# Patient Record
Sex: Male | Born: 1960 | Race: Black or African American | Hispanic: No | Marital: Married | Smoking: Former smoker
Health system: Southern US, Community
[De-identification: ages and names within clinical notes are randomized; demographics above are authoritative.]

## PROBLEM LIST (undated history)

## (undated) DIAGNOSIS — R0602 Shortness of breath: Secondary | ICD-10-CM

## (undated) DIAGNOSIS — K219 Gastro-esophageal reflux disease without esophagitis: Secondary | ICD-10-CM

## (undated) DIAGNOSIS — K8020 Calculus of gallbladder without cholecystitis without obstruction: Secondary | ICD-10-CM

## (undated) DIAGNOSIS — G4733 Obstructive sleep apnea (adult) (pediatric): Secondary | ICD-10-CM

## (undated) DIAGNOSIS — I10 Essential (primary) hypertension: Secondary | ICD-10-CM

## (undated) DIAGNOSIS — G8929 Other chronic pain: Secondary | ICD-10-CM

## (undated) DIAGNOSIS — M1990 Unspecified osteoarthritis, unspecified site: Secondary | ICD-10-CM

## (undated) DIAGNOSIS — M109 Gout, unspecified: Secondary | ICD-10-CM

## (undated) DIAGNOSIS — E1142 Type 2 diabetes mellitus with diabetic polyneuropathy: Secondary | ICD-10-CM

## (undated) DIAGNOSIS — E119 Type 2 diabetes mellitus without complications: Secondary | ICD-10-CM

## (undated) DIAGNOSIS — J449 Chronic obstructive pulmonary disease, unspecified: Secondary | ICD-10-CM

## (undated) DIAGNOSIS — J189 Pneumonia, unspecified organism: Secondary | ICD-10-CM

## (undated) DIAGNOSIS — F419 Anxiety disorder, unspecified: Secondary | ICD-10-CM

## (undated) DIAGNOSIS — E785 Hyperlipidemia, unspecified: Secondary | ICD-10-CM

## (undated) DIAGNOSIS — F528 Other sexual dysfunction not due to a substance or known physiological condition: Secondary | ICD-10-CM

## (undated) DIAGNOSIS — I4892 Unspecified atrial flutter: Principal | ICD-10-CM

## (undated) DIAGNOSIS — K5790 Diverticulosis of intestine, part unspecified, without perforation or abscess without bleeding: Secondary | ICD-10-CM

## (undated) DIAGNOSIS — F329 Major depressive disorder, single episode, unspecified: Secondary | ICD-10-CM

## (undated) DIAGNOSIS — I509 Heart failure, unspecified: Secondary | ICD-10-CM

## (undated) DIAGNOSIS — M549 Dorsalgia, unspecified: Secondary | ICD-10-CM

## (undated) DIAGNOSIS — J42 Unspecified chronic bronchitis: Secondary | ICD-10-CM

## (undated) DIAGNOSIS — J45909 Unspecified asthma, uncomplicated: Secondary | ICD-10-CM

## (undated) DIAGNOSIS — I219 Acute myocardial infarction, unspecified: Secondary | ICD-10-CM

## (undated) HISTORY — DX: Other sexual dysfunction not due to a substance or known physiological condition: F52.8

## (undated) HISTORY — PX: FINGER AMPUTATION: SHX636

## (undated) HISTORY — PX: CARDIAC CATHETERIZATION: SHX172

## (undated) HISTORY — DX: Major depressive disorder, single episode, unspecified: F32.9

## (undated) HISTORY — DX: Obstructive sleep apnea (adult) (pediatric): G47.33

## (undated) HISTORY — DX: Type 2 diabetes mellitus with diabetic polyneuropathy: E11.42

## (undated) HISTORY — DX: Gout, unspecified: M10.9

## (undated) HISTORY — DX: Hyperlipidemia, unspecified: E78.5

## (undated) HISTORY — DX: Anxiety disorder, unspecified: F41.9

## (undated) HISTORY — DX: Diverticulosis of intestine, part unspecified, without perforation or abscess without bleeding: K57.90

---
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
## 1989-07-25 HISTORY — PX: ANKLE RECONSTRUCTION: SHX1151

## 1996-11-24 DIAGNOSIS — G4733 Obstructive sleep apnea (adult) (pediatric): Secondary | ICD-10-CM

## 1996-11-24 HISTORY — DX: Obstructive sleep apnea (adult) (pediatric): G47.33

## 1999-10-28 ENCOUNTER — Encounter: Payer: Self-pay | Admitting: *Deleted

## 1999-10-28 ENCOUNTER — Inpatient Hospital Stay (HOSPITAL_COMMUNITY): Admission: EM | Admit: 1999-10-28 | Discharge: 1999-10-31 | Payer: Self-pay | Admitting: Emergency Medicine

## 1999-10-29 ENCOUNTER — Encounter: Payer: Self-pay | Admitting: Internal Medicine

## 1999-11-28 ENCOUNTER — Ambulatory Visit (HOSPITAL_COMMUNITY): Admission: RE | Admit: 1999-11-28 | Discharge: 1999-11-28 | Payer: Self-pay | Admitting: *Deleted

## 1999-12-05 ENCOUNTER — Ambulatory Visit (HOSPITAL_COMMUNITY): Admission: RE | Admit: 1999-12-05 | Discharge: 1999-12-05 | Payer: Self-pay | Admitting: Pulmonary Disease

## 1999-12-05 ENCOUNTER — Encounter: Payer: Self-pay | Admitting: Pulmonary Disease

## 1999-12-08 ENCOUNTER — Ambulatory Visit: Admission: RE | Admit: 1999-12-08 | Discharge: 1999-12-08 | Payer: Self-pay | Admitting: Pulmonary Disease

## 2002-03-01 ENCOUNTER — Encounter: Payer: Self-pay | Admitting: Cardiology

## 2002-03-01 ENCOUNTER — Encounter: Admission: RE | Admit: 2002-03-01 | Discharge: 2002-03-01 | Payer: Self-pay | Admitting: Cardiology

## 2002-04-26 ENCOUNTER — Ambulatory Visit (HOSPITAL_COMMUNITY): Admission: RE | Admit: 2002-04-26 | Discharge: 2002-04-26 | Payer: Self-pay | Admitting: Cardiology

## 2002-04-26 ENCOUNTER — Encounter: Payer: Self-pay | Admitting: Cardiology

## 2003-01-11 ENCOUNTER — Encounter: Payer: Self-pay | Admitting: Cardiology

## 2003-01-11 ENCOUNTER — Encounter: Admission: RE | Admit: 2003-01-11 | Discharge: 2003-01-11 | Payer: Self-pay | Admitting: Cardiology

## 2003-08-03 ENCOUNTER — Encounter: Admission: RE | Admit: 2003-08-03 | Discharge: 2003-11-01 | Payer: Self-pay | Admitting: Cardiology

## 2004-02-19 ENCOUNTER — Encounter (HOSPITAL_COMMUNITY): Admission: RE | Admit: 2004-02-19 | Discharge: 2004-05-19 | Payer: Self-pay | Admitting: Cardiology

## 2004-07-08 ENCOUNTER — Observation Stay (HOSPITAL_COMMUNITY): Admission: EM | Admit: 2004-07-08 | Discharge: 2004-07-09 | Payer: Self-pay | Admitting: Emergency Medicine

## 2011-10-27 ENCOUNTER — Emergency Department (HOSPITAL_COMMUNITY): Payer: Managed Care, Other (non HMO)

## 2011-10-27 ENCOUNTER — Other Ambulatory Visit: Payer: Self-pay

## 2011-10-27 ENCOUNTER — Inpatient Hospital Stay (HOSPITAL_COMMUNITY)
Admission: EM | Admit: 2011-10-27 | Discharge: 2011-10-30 | DRG: 291 | Disposition: A | Discharge status: Home / Self Care | Payer: Managed Care, Other (non HMO) | Attending: Internal Medicine | Admitting: Internal Medicine

## 2011-10-27 ENCOUNTER — Encounter: Payer: Self-pay | Admitting: *Deleted

## 2011-10-27 DIAGNOSIS — E669 Obesity, unspecified: Secondary | ICD-10-CM | POA: Diagnosis present

## 2011-10-27 DIAGNOSIS — I509 Heart failure, unspecified: Secondary | ICD-10-CM

## 2011-10-27 DIAGNOSIS — R509 Fever, unspecified: Secondary | ICD-10-CM | POA: Diagnosis present

## 2011-10-27 DIAGNOSIS — I10 Essential (primary) hypertension: Secondary | ICD-10-CM | POA: Diagnosis present

## 2011-10-27 DIAGNOSIS — E119 Type 2 diabetes mellitus without complications: Secondary | ICD-10-CM | POA: Diagnosis present

## 2011-10-27 DIAGNOSIS — R0600 Dyspnea, unspecified: Secondary | ICD-10-CM | POA: Diagnosis present

## 2011-10-27 DIAGNOSIS — I5022 Chronic systolic (congestive) heart failure: Secondary | ICD-10-CM | POA: Diagnosis present

## 2011-10-27 DIAGNOSIS — M109 Gout, unspecified: Secondary | ICD-10-CM | POA: Insufficient documentation

## 2011-10-27 DIAGNOSIS — F17200 Nicotine dependence, unspecified, uncomplicated: Secondary | ICD-10-CM | POA: Diagnosis present

## 2011-10-27 DIAGNOSIS — K5792 Diverticulitis of intestine, part unspecified, without perforation or abscess without bleeding: Secondary | ICD-10-CM | POA: Insufficient documentation

## 2011-10-27 DIAGNOSIS — I5033 Acute on chronic diastolic (congestive) heart failure: Principal | ICD-10-CM | POA: Diagnosis present

## 2011-10-27 DIAGNOSIS — R319 Hematuria, unspecified: Secondary | ICD-10-CM | POA: Diagnosis not present

## 2011-10-27 DIAGNOSIS — J189 Pneumonia, unspecified organism: Secondary | ICD-10-CM | POA: Diagnosis present

## 2011-10-27 DIAGNOSIS — J984 Other disorders of lung: Secondary | ICD-10-CM | POA: Diagnosis present

## 2011-10-27 DIAGNOSIS — Z79899 Other long term (current) drug therapy: Secondary | ICD-10-CM

## 2011-10-27 HISTORY — DX: Unspecified osteoarthritis, unspecified site: M19.90

## 2011-10-27 HISTORY — DX: Essential (primary) hypertension: I10

## 2011-10-27 HISTORY — DX: Shortness of breath: R06.02

## 2011-10-27 HISTORY — DX: Heart failure, unspecified: I50.9

## 2011-10-27 LAB — COMPREHENSIVE METABOLIC PANEL
AST: 29 U/L (ref 0–37)
Albumin: 2.6 g/dL — ABNORMAL LOW (ref 3.5–5.2)
Albumin: 2.5 g/dL — ABNORMAL LOW (ref 3.5–5.2)
Alkaline Phosphatase: 82 U/L (ref 39–117)
BUN: 14 mg/dL (ref 6–23)
CO2: 28 mEq/L (ref 19–32)
Calcium: 8.1 mg/dL — ABNORMAL LOW (ref 8.4–10.5)
Chloride: 97 mEq/L (ref 96–112)
Creatinine, Ser: 0.94 mg/dL (ref 0.50–1.35)
Creatinine, Ser: 0.96 mg/dL (ref 0.50–1.35)
GFR calc Af Amer: >90 mL/min (ref >90)
GFR calc non Af Amer: >90 mL/min (ref >90)
GFR calc non Af Amer: >90 mL/min (ref >90)
Glucose, Bld: 192 mg/dL — ABNORMAL HIGH (ref 70–99)
Potassium: 3.5 mEq/L (ref 3.5–5.1)
Total Bilirubin: 0.7 mg/dL (ref 0.3–1.2)
Total Protein: 7.4 g/dL (ref 6.0–8.3)

## 2011-10-27 LAB — TSH: TSH: 3.181 u[IU]/mL (ref 0.350–4.500)

## 2011-10-27 LAB — LIPID PANEL
Cholesterol: 144 mg/dL (ref 0–200)
HDL: 20 mg/dL — ABNORMAL LOW (ref >39)
LDL Cholesterol: 94 mg/dL (ref 0–99)
Total CHOL/HDL Ratio: 7.2 RATIO
Triglycerides: 150 mg/dL — ABNORMAL HIGH (ref <150)
VLDL: 30 mg/dL (ref 0–40)

## 2011-10-27 LAB — GLUCOSE, CAPILLARY
Glucose-Capillary: 132 mg/dL — ABNORMAL HIGH (ref 70–99)
Glucose-Capillary: 135 mg/dL — ABNORMAL HIGH (ref 70–99)
Glucose-Capillary: 218 mg/dL — ABNORMAL HIGH (ref 70–99)

## 2011-10-27 LAB — CREATININE, SERUM
Creatinine, Ser: 0.99 mg/dL (ref 0.50–1.35)
GFR calc Af Amer: >90 mL/min (ref >90)
GFR calc non Af Amer: >90 mL/min (ref >90)

## 2011-10-27 LAB — PRO B NATRIURETIC PEPTIDE: Pro B Natriuretic peptide (BNP): 2516 pg/mL — ABNORMAL HIGH (ref 0–125)

## 2011-10-27 LAB — CBC
HCT: 33.4 % — ABNORMAL LOW (ref 39.0–52.0)
Hemoglobin: 11 g/dL — ABNORMAL LOW (ref 13.0–17.0)
Hemoglobin: 10.4 g/dL — ABNORMAL LOW (ref 13.0–17.0)
MCH: 27.6 pg (ref 26.0–34.0)
Platelets: 206 10*3/uL (ref 150–400)
Platelets: 200 10*3/uL (ref 150–400)
RBC: 3.96 MIL/uL — ABNORMAL LOW (ref 4.22–5.81)
RBC: 3.85 MIL/uL — ABNORMAL LOW (ref 4.22–5.81)
RBC: 3.77 MIL/uL — ABNORMAL LOW (ref 4.22–5.81)
RDW: 14 % (ref 11.5–15.5)
WBC: 5.4 10*3/uL (ref 4.0–10.5)
WBC: 4.8 10*3/uL (ref 4.0–10.5)

## 2011-10-27 LAB — CARDIAC PANEL(CRET KIN+CKTOT+MB+TROPI)
CK, MB: 1.9 ng/mL (ref 0.3–4.0)
CK, MB: 1.7 ng/mL (ref 0.3–4.0)
Total CK: 66 U/L (ref 7–232)
Troponin I: <0.3 ng/mL (ref <0.30)
Troponin I: <0.3 ng/mL (ref <0.30)

## 2011-10-27 LAB — DIFFERENTIAL
Eosinophils Absolute: 0 10*3/uL (ref 0.0–0.7)
Lymphs Abs: 0.8 10*3/uL (ref 0.7–4.0)
Monocytes Absolute: 0.4 10*3/uL (ref 0.1–1.0)
Monocytes Relative: 12 % (ref 3–12)
Neutrophils Relative %: 63 % (ref 43–77)

## 2011-10-27 LAB — URINALYSIS, ROUTINE W REFLEX MICROSCOPIC
Bilirubin Urine: NEGATIVE
Ketones, ur: NEGATIVE mg/dL
Nitrite: NEGATIVE
Specific Gravity, Urine: 1.007 (ref 1.005–1.030)
Urobilinogen, UA: 0.2 mg/dL (ref 0.0–1.0)

## 2011-10-27 LAB — INFLUENZA PANEL BY PCR (TYPE A & B)
H1N1 flu by pcr: NOT DETECTED
Influenza B By PCR: NEGATIVE

## 2011-10-27 LAB — URINE MICROSCOPIC-ADD ON

## 2011-10-27 LAB — LACTIC ACID, PLASMA: Lactic Acid, Venous: 1.7 mmol/L (ref 0.5–2.2)

## 2011-10-27 MED ORDER — ACETAMINOPHEN 325 MG PO TABS
ORAL_TABLET | ORAL | Status: AC
  Filled 2011-10-27: qty 3

## 2011-10-27 MED ORDER — LISINOPRIL 5 MG PO TABS
5.0000 mg | ORAL_TABLET | Freq: Every day | ORAL | Status: DC
  Administered 2011-10-28 – 2011-10-29 (×2): 5 mg via ORAL
  Filled 2011-10-27 (×3): qty 1

## 2011-10-27 MED ORDER — ACETAMINOPHEN 325 MG PO TABS
650.0000 mg | ORAL_TABLET | ORAL | Status: DC | PRN
  Administered 2011-10-27: 650 mg via ORAL

## 2011-10-27 MED ORDER — ONDANSETRON HCL 4 MG/2ML IJ SOLN: 4.0000 mg | Freq: Three times a day (TID) | INTRAMUSCULAR | Status: DC | PRN

## 2011-10-27 MED ORDER — SODIUM CHLORIDE 0.9 % IV SOLN
250.0000 mL | INTRAVENOUS | Status: DC | PRN
  Administered 2011-10-28: 20 mL via INTRAVENOUS

## 2011-10-27 MED ORDER — ZOLPIDEM TARTRATE 5 MG PO TABS
5.0000 mg | ORAL_TABLET | Freq: Every evening | ORAL | Status: DC | PRN
  Administered 2011-10-27 – 2011-10-29 (×3): 5 mg via ORAL
  Filled 2011-10-27 (×3): qty 1

## 2011-10-27 MED ORDER — FUROSEMIDE 10 MG/ML IJ SOLN
80.0000 mg | Freq: Once | INTRAMUSCULAR | Status: AC
  Administered 2011-10-27: 80 mg via INTRAVENOUS
  Filled 2011-10-27: qty 8

## 2011-10-27 MED ORDER — FUROSEMIDE 10 MG/ML IJ SOLN
40.0000 mg | Freq: Two times a day (BID) | INTRAMUSCULAR | Status: DC
  Administered 2011-10-27 – 2011-10-29 (×4): 40 mg via INTRAVENOUS
  Filled 2011-10-27 (×7): qty 4

## 2011-10-27 MED ORDER — NEBIVOLOL HCL 10 MG PO TABS: 10.0000 mg | ORAL_TABLET | Freq: Every day | ORAL | Status: DC

## 2011-10-27 MED ORDER — NITROGLYCERIN 2 % TD OINT
0.5000 [in_us] | TOPICAL_OINTMENT | Freq: Once | TRANSDERMAL | Status: AC
  Administered 2011-10-27: 0.5 [in_us] via TOPICAL
  Filled 2011-10-27: qty 1

## 2011-10-27 MED ORDER — ACETAMINOPHEN 500 MG PO TABS
1000.0000 mg | ORAL_TABLET | ORAL | Status: AC
  Administered 2011-10-27: 1000 mg via ORAL
  Filled 2011-10-27: qty 2

## 2011-10-27 MED ORDER — LISINOPRIL 5 MG PO TABS: 5.0000 mg | ORAL_TABLET | Freq: Every day | ORAL | Status: DC

## 2011-10-27 MED ORDER — INSULIN ASPART 100 UNIT/ML ~~LOC~~ SOLN
0.0000 [IU] | Freq: Three times a day (TID) | SUBCUTANEOUS | Status: DC
  Administered 2011-10-28: 11 [IU] via SUBCUTANEOUS
  Administered 2011-10-28: 4 [IU] via SUBCUTANEOUS
  Administered 2011-10-29: 3 [IU] via SUBCUTANEOUS
  Administered 2011-10-29: 11 [IU] via SUBCUTANEOUS
  Administered 2011-10-30: 3 [IU] via SUBCUTANEOUS
  Administered 2011-10-30: 11 [IU] via SUBCUTANEOUS
  Filled 2011-10-27: qty 3

## 2011-10-27 MED ORDER — HEPARIN SODIUM (PORCINE) 5000 UNIT/ML IJ SOLN
5000.0000 [IU] | Freq: Three times a day (TID) | INTRAMUSCULAR | Status: DC
  Administered 2011-10-27 – 2011-10-29 (×7): 5000 [IU] via SUBCUTANEOUS
  Filled 2011-10-27 (×9): qty 1

## 2011-10-27 MED ORDER — ASPIRIN 81 MG PO CHEW
81.0000 mg | CHEWABLE_TABLET | Freq: Every day | ORAL | Status: DC
  Administered 2011-10-27 – 2011-10-29 (×3): 81 mg via ORAL
  Filled 2011-10-27 (×3): qty 1

## 2011-10-27 MED ORDER — HYDROMORPHONE HCL PF 1 MG/ML IJ SOLN: 1.0000 mg | INTRAMUSCULAR | Status: DC | PRN

## 2011-10-27 MED ORDER — METOPROLOL TARTRATE 50 MG PO TABS
50.0000 mg | ORAL_TABLET | Freq: Two times a day (BID) | ORAL | Status: DC
  Administered 2011-10-27 – 2011-10-28 (×2): 50 mg via ORAL
  Filled 2011-10-27: qty 1
  Filled 2011-10-27: qty 2
  Filled 2011-10-27 (×2): qty 1

## 2011-10-27 MED ORDER — SODIUM CHLORIDE 0.9 % IJ SOLN: 3.0000 mL | INTRAMUSCULAR | Status: DC | PRN

## 2011-10-27 MED ORDER — SODIUM CHLORIDE 0.9 % IJ SOLN
3.0000 mL | Freq: Two times a day (BID) | INTRAMUSCULAR | Status: DC
  Administered 2011-10-27 – 2011-10-28 (×2): 3 mL via INTRAVENOUS

## 2011-10-27 MED ORDER — ASPIRIN 81 MG PO TABS: 81.0000 mg | ORAL_TABLET | Freq: Every day | ORAL | Status: DC

## 2011-10-27 MED ORDER — POTASSIUM CHLORIDE CRYS ER 20 MEQ PO TBCR
40.0000 meq | EXTENDED_RELEASE_TABLET | Freq: Every day | ORAL | Status: DC
  Administered 2011-10-27 – 2011-10-30 (×4): 40 meq via ORAL
  Filled 2011-10-27 (×5): qty 2

## 2011-10-27 MED ORDER — NEBIVOLOL HCL 10 MG PO TABS
10.0000 mg | ORAL_TABLET | ORAL | Status: AC
  Administered 2011-10-27: 10 mg via ORAL
  Filled 2011-10-27: qty 1

## 2011-10-27 MED ORDER — FUROSEMIDE 10 MG/ML IJ SOLN
20.0000 mg | Freq: Four times a day (QID) | INTRAMUSCULAR | Status: DC
  Administered 2011-10-27: 20 mg via INTRAVENOUS
  Filled 2011-10-27: qty 2

## 2011-10-27 MED ORDER — LISINOPRIL 5 MG PO TABS
5.0000 mg | ORAL_TABLET | ORAL | Status: DC
  Filled 2011-10-27: qty 1

## 2011-10-27 MED ORDER — ONDANSETRON HCL 4 MG/2ML IJ SOLN
4.0000 mg | Freq: Once | INTRAMUSCULAR | Status: AC
  Administered 2011-10-27: 4 mg via INTRAVENOUS
  Filled 2011-10-27: qty 2

## 2011-10-27 MED ORDER — NEBIVOLOL HCL 10 MG PO TABS
10.0000 mg | ORAL_TABLET | Freq: Every day | ORAL | Status: DC
  Administered 2011-10-28 – 2011-10-30 (×3): 10 mg via ORAL
  Filled 2011-10-27 (×3): qty 1

## 2011-10-27 MED ORDER — BUPROPION HCL ER (XL) 150 MG PO TB24
150.0000 mg | ORAL_TABLET | Freq: Every day | ORAL | Status: DC
  Administered 2011-10-28 – 2011-10-30 (×3): 150 mg via ORAL
  Filled 2011-10-27 (×4): qty 1

## 2011-10-27 MED ORDER — ONDANSETRON HCL 4 MG/2ML IJ SOLN: 4.0000 mg | Freq: Four times a day (QID) | INTRAMUSCULAR | Status: DC | PRN

## 2011-10-27 NOTE — ED Notes (Signed)
Patient with fever, flu like symptoms for about 2 weeks and the last three days it has been getting worse.  Patient c/o shortness of breath (history of CHF) and cough

## 2011-10-27 NOTE — ED Notes (Signed)
Pt has lopressor and lisinopril ordered. States his MD took him off of those meds. Will call pharm tech to reconcil meds. Pt declines these meds.

## 2011-10-27 NOTE — ED Notes (Signed)
[id number] Hospitalist paged

## 2011-10-27 NOTE — ED Notes (Signed)
cbg is 132

## 2011-10-27 NOTE — ED Provider Notes (Signed)
History     CSN: [id number] Arrival date & time: 10/27/2011  1:29 AM   First MD Initiated Contact with Patient 10/27/11 0134      Chief Complaint  Patient presents with  . Fever    (Consider location/radiation/quality/duration/timing/severity/associated sxs/prior treatment) HPI This 50 year old male presents with dyspnea, generalized discomfort, fever, nausea, vomiting, diarrhea. The patient was in his usual health until approximately 2 weeks ago he gradually became more uncomfortable. Over the subsequent days the patient's discomfort increased without improvement from any OTC medications. The patient at baseline has dyspnea on exertion, he notes that this worsened throughout this time course. 2 days prior to this presentation the patient recalls a possible inhalation injury (he was working with solvents on an airplane when his dyspnea became particularly worse.) The patient denies significant pain but does note that his anterior chest is sore. This is also not improved with any OTC medications. The patient notes his nausea has been progressive over this time period and over last 2-3 days he has had persistent nausea with vomiting and diarrhea (no blood) The patient was subjectively febrile and also complains of chills. Past Medical History  Diagnosis Date  . Diabetes mellitus   . Hypertension   . Congestive heart failure     History reviewed. No pertinent past surgical history.  History reviewed. No pertinent family history.  History  Substance Use Topics  . Smoking status: Current Everyday Smoker  . Smokeless tobacco: Not on file  . Alcohol Use: Yes      Review of Systems  All other systems reviewed and are negative.    Allergies  Review of patient's allergies indicates no known allergies.  Home Medications   Current Outpatient Rx  Name Route Sig Dispense Refill  . ASPIRIN EFFERVESCENT 325 MG PO TBEF Oral Take 325 mg by mouth every 6 (six) hours as needed. For  cold     . BUPROPION HCL ER (XL) 150 MG PO TB24 Oral Take 150 mg by mouth daily.      . FUROSEMIDE 20 MG PO TABS Oral Take 20 mg by mouth daily.      Marland Kitchen METOPROLOL TARTRATE 50 MG PO TABS Oral Take 50 mg by mouth 2 (two) times daily.      . NEBIVOLOL HCL 10 MG PO TABS Oral Take 10 mg by mouth daily.      Marland Kitchen SITAGLIPTIN-METFORMIN HCL 50-1000 MG PO TABS Oral Take 1 tablet by mouth 2 (two) times daily with a meal.        BP 171/70  Pulse 95  Temp(Src) 102.8 F (39.3 C) (Oral)  Resp 28  SpO2 95%  Physical Exam  Constitutional: He is oriented to person, place, and time. He appears well-developed and well-nourished. No distress.       Obese male uncomfortable appearing  HENT:  Head: Normocephalic and atraumatic.  Eyes: Conjunctivae and EOM are normal. Pupils are equal, round, and reactive to light.  Neck: No JVD present.  Cardiovascular: Regular rhythm and normal pulses.  Tachycardia present.   Pulmonary/Chest: No accessory muscle usage or stridor. Tachypnea noted. He has decreased breath sounds. He has no wheezes. He has no rhonchi.  Abdominal: Soft. He exhibits no distension.  Musculoskeletal: He exhibits no edema and no tenderness.  Lymphadenopathy:    He has no cervical adenopathy.  Neurological: He is alert and oriented to person, place, and time. No cranial nerve deficit.  Skin: Skin is warm and dry. No rash noted.  Psychiatric: He has  a normal mood and affect.    ED Course  Procedures (including critical care time)  Labs Reviewed  COMPREHENSIVE METABOLIC PANEL - Abnormal; Notable for the following:    Sodium 134 (*)    Glucose, Bld 192 (*)    Calcium 8.3 (*)    Albumin 2.6 (*)    All other components within normal limits  CBC - Abnormal; Notable for the following:    RBC 3.96 (*)    Hemoglobin 11.6 (*)    HCT 34.6 (*)    All other components within normal limits  PRO B NATRIURETIC PEPTIDE - Abnormal; Notable for the following:    BNP, POC 2516.0 (*)    All other  components within normal limits  CARDIAC PANEL(CRET KIN+CKTOT+MB+TROPI)   Dg Chest 2 View  10/27/2011  *RADIOLOGY REPORT*  Clinical Data: Shortness of breath.  CHEST - 2 VIEW  Comparison: Chest radiograph performed 07/08/2004  Findings: The lungs are well-aerated.  Vascular congestion is noted, with bilateral central and basilar airspace opacities, compatible with pulmonary edema.  Bilateral pleural thickening appears chronic; no definite pleural effusion is seen.  The heart is significantly enlarged.  No acute osseous abnormalities are seen.  IMPRESSION: Vascular congestion and cardiomegaly, with bilateral central and bibasilar airspace opacities, compatible with pulmonary edema.  Original Report Authenticated By: Tonia Ghent, M.D.  (REVIEWED)   No diagnosis found.   Date: 10/27/2011  Rate: 77  Rhythm: normal sinus rhythm  QRS Axis: normal  Intervals: normal  ST/T Wave abnormalities: nonspecific ST/T changes  Conduction Disutrbances:none  Narrative Interpretation:   Old EKG Reviewed: none available    MDM  This 50 year old male with history of heart failure, and hypertension now presents with 2 weeks of dyspnea and generalized discomfort. The patient notes that his symptoms became particularly worse over last 2 days. On exam the patient is in no distress though he is tachypneic. The patient's presentation is concerning for CHF exacerbation versus pneumonia versus ACS. The patient's labs are notable for an absence of evidence for ACS, and a BNP that is significantly elevated. Given the patient's medical history, his increasing dyspnea, and his laboratory findings his presentation is consistent with heart failure exacerbation. Patient's fever would be an atypical manifestation of a heart failure exacerbation; a concurrent infection is a consideration though with the absence of a leukocytosis this seems less likely. Another possibility is the patient's possible inhalation injury with resultant  pneumonitis, which also explains the progression of symptoms more significantly in the past 2 days.           Gerhard Munch, MD 10/27/11 [phone]

## 2011-10-27 NOTE — ED Notes (Signed)
Patient took 2 tylenol prior to coming to ED

## 2011-10-27 NOTE — Progress Notes (Signed)
Patient ID: Jeremiah Perkins, male   DOB: 09/28/1961, 50 y.o.   MRN: [id number]  Subjective: No events overnight. Patient denies chest pain, shortness of breath, abdominal pain.   Objective:  Vital signs in last 24 hours:  Filed Vitals:   10/27/11 0500 10/27/11 0604 10/27/11 0732 10/27/11 1212  BP: 105/52  110/53 112/56  Pulse: 72 100 67 63  Temp:  98.9 F (37.2 C)    TempSrc:  Oral    Resp:    22  SpO2: 92%  97% 97%    Intake/Output from previous day:   Intake/Output Summary (Last 24 hours) at 10/27/11 1243 Last data filed at 10/27/11 1227  Gross per 24 hour  Intake    120 ml  Output   1300 ml  Net  -1180 ml    Physical Exam: General: Alert, awake, oriented x3, in no acute distress. HEENT: No bruits, no goiter. Moist mucous membranes, no scleral icterus, no conjunctival pallor. Heart: Regular rate and rhythm, without murmurs, rubs, gallops. Lungs: Scaterred crackles and mostly at lung bases, no wheezing, no rhonchi Abdomen: Soft, nontender, nondistended, positive bowel sounds. Extremities: No clubbing cyanosis, bilateral +1 pitting edema noted,  positive pedal pulses. Neuro: Grossly intact, nonfocal.  Lab Results:  Basic Metabolic Panel:    Component Value Date/Time   NA 134* 10/27/2011 0156   K 3.5 10/27/2011 0156   CL 97 10/27/2011 0156   CO2 25 10/27/2011 0156   BUN 14 10/27/2011 0156   CREATININE 0.99 10/27/2011 0609   GLUCOSE 192* 10/27/2011 0156   CALCIUM 8.3* 10/27/2011 0156   CBC:    Component Value Date/Time   WBC 4.8 10/27/2011 0609   HGB 11.0* 10/27/2011 0609   HCT 33.7* 10/27/2011 0609   PLT 200 10/27/2011 0609   MCV 87.5 10/27/2011 0609      Lab 10/27/11 0609 10/27/11 0156  WBC 4.8 5.4  HGB 11.0* 11.6*  HCT 33.7* 34.6*  PLT 200 206  MCV 87.5 87.4  MCH 28.6 29.3  MCHC 32.6 33.5  RDW 14.1 14.0  LYMPHSABS -- --  MONOABS -- --  EOSABS -- --  BASOSABS -- --  BANDABS -- --    Lab 10/27/11 0609 10/27/11 0156  NA -- 134*  K -- 3.5  CL -- 97   CO2 -- 25  GLUCOSE -- 192*  BUN -- 14  CREATININE 0.99 0.94  CALCIUM -- 8.3*  MG -- --   No results found for this basename: INR:5,PROTIME:5 in the last 168 hours Cardiac markers:  Lab 10/27/11 0156  CKMB 1.5  TROPONINI <0.30  MYOGLOBIN --    Lab 10/27/11 0157  POCBNP 2516.0*   No results found for this or any previous visit (from the past 240 hour(s)).  Studies/Results:  Dg Chest 2 View 10/27/2011  IMPRESSION: Vascular congestion and cardiomegaly, with bilateral central and bibasilar airspace opacities, compatible with pulmonary edema.     Medications: Scheduled Meds:   . acetaminophen  1,000 mg Oral To Major  . buPROPion  150 mg Oral Daily  . furosemide  20 mg Intravenous Q6H  . furosemide  80 mg Intravenous Once  . heparin  5,000 Units Subcutaneous Q8H  . insulin aspart  0-20 Units Subcutaneous TID WC  . lisinopril  5 mg Oral To Minor  . metoprolol  50 mg Oral BID  . nebivolol  10 mg Oral Daily  . nitroGLYCERIN  0.5 inch Topical Once  . ondansetron (ZOFRAN) IV  4 mg Intravenous Once  .  DISCONTD: aspirin  81 mg Oral Daily  . DISCONTD: lisinopril  5 mg Oral Daily   Continuous Infusions:  PRN Meds:.DISCONTD:  HYDROmorphone (DILAUDID) injection, DISCONTD: ondansetron (ZOFRAN) IV  Assessment/Plan:  Principal Problem:  *Dyspnea  - most likely secondary to CHF exacerbation as is suggested by clinical exam findings and elevated BNP along with CXR findings - pt clinically improving already given one dose of Lasix 80 mg IV - will continue to monitor on telemetry - cycle cardiac enzymes, follow up on 2 D ECHO - follow strict I's and O's - obtain TSH  Active Problem  Diabetes mellitus - check A1C and initiate regimen if indicated   Hypertension - currently well controlled - continue home dose regimen   Congestive heart failure - follow up on 2 D ECHO   Fever and chills - unclear if this exacerbation is also in combination with PNA - for now will treat  empirically with Avalox - monitor vitals    LOS: 0 days   [name] 10/27/2011, 12:43 PM

## 2011-10-27 NOTE — Progress Notes (Signed)
*  PRELIMINARY RESULTS* Echocardiogram 2D Echocardiogram has been performed.  Clide Deutscher RDCS 10/27/2011, 9:33 AM

## 2011-10-27 NOTE — H&P (Signed)
PCP:  Ovidio Kin, who is apparently a midlevel, possibly for [facility] This is gathered by looking at the names on his pill bottles  Chief Complaint:  Dyspnea, fevers   HPI: 50yoM with h/o DM, CHF of unclear kind (presumed dCHF), HTN presents with several weeks of  increasing dyspnea, chest tightness, and fevers, chills, and sweats; found to have likely  pulmonary edema and fever to 102.8.   Of note, pt was admitted 10/1999 (12 years ago) for 7-10d history of diffuse aches and pains  associated with malaise and chills thought consistent with viral syndrome. He was seen to have  edema and cardiomegaly on CXR at that time as well. He had a chest CT at that time with shotty  nodes, one pretracheal node at 1.2cm, and right perihilar alveolitis question of bronchopneumonia  vs aspiration. He was diuresed and was breathing much better. He was set up for cardiolite as  outpt, which we don't really have the results of. What we do have in our records is a nuclear  myocardial perfusion scan 01/2004 that was suboptimal likely due to his large habitus, but from  what was seen, no definite perfusion defect was seen and LVEF calculated at 61%. He does not  follow with a cardiologist.   Pt states he was taken off Lasix in January as he was told he doesn't need it, which he appears to  disagree with. More recently he also states that several weeks ago his BP meds were changed around  and he was stopped on Lisinopril and started something else. He thinks this triggered his  decompensation, such that for the past few weeks he's been more dyspnea, can't catch his breath,  getting winded just walking in the parking lot to his job as an [employer]. He hasn't  noticed much leg swelling. He cannot lay on his back at baseline, so orthopnea unclear. He went to  his primary provider and "begged" her to restart Lasix which was done a week ago, and sent him to  get an echo which was scheduled for  this morning but he was decompensating so much he came to ED  instead.   He also states that since the Wednesday before last (about 8-10 days ago) he started having  fevers, chills, and "sweating up the bed" that have been off/on but getting worse such that  Saturday his temp at home was 103.7. He's had occasional cough but non-productive, no myalgias or  MSK aches, no abdominal pain, although just in the past few days he's had vomiting and diarrhea  (non-bloody). He does endorse dysuria esp. in the setting of increased urination with Lasix. He's  had some headaches. 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   In the ED pt was febrile to 102.8, had HTN to 171/70, but p95, oxygenating well. Chemistry panel  with Na 134, renal 14/0.94, glucose 192, LFT's normal, negative cardiac enzymes x1, WBC 5.4, Hct  34.6, plts 206, BNP 2516, and CXR showed vascular congestion with bilateral central and bibasilar  airspace opacities, compatible with pulmonary edema; also with bilateral pleural thickening that  appeared chronic, and significant cardiomegaly. Pt was given 1g tylenol, 80mg  IV Lasix, SL NTG  ointment, and Zofran.   ROS as above o/w unremarkable.  Past Medical History  Diagnosis Date  . Diabetes mellitus   . Hypertension   . Congestive heart failure     No echo data available. Myocardial perfusion 2005 with EF 61%, presumed diastolic HF   . Diverticulitis   . Gout     History reviewed. No pertinent past surgical history.  Medications:  HOME MEDS: Prior to Admission medications   Medication Sig Start Date End Date Taking? Authorizing Provider  aspirin 81 MG tablet Take 81 mg by mouth daily.     Yes Historical Provider, MD  aspirin-sod bicarb-citric acid (ALKA-SELTZER) 325 MG  TBEF Take 325 mg by mouth every 6 (six) hours as needed. For cold    Yes Historical Provider, MD  buPROPion (WELLBUTRIN XL) 150 MG 24 hr tablet Take 150 mg by mouth daily.     Yes Historical Provider, MD  furosemide (LASIX) 20 MG tablet Take 20 mg by mouth daily.     Yes Historical Provider, MD  metoprolol (LOPRESSOR) 50 MG tablet Take 50 mg by mouth 2 (two) times daily.     Yes Historical Provider, MD  nebivolol (BYSTOLIC) 10 MG tablet Take 10 mg by mouth daily.     Yes Historical Provider, MD  sitaGLIPtan-metformin (JANUMET) 50-1000 MG per tablet Take 1 tablet by mouth 2 (two) times daily with a meal.     Yes Historical Provider, MD    Allergies:  No Known Allergies  Social History:   reports that he has been smoking Cigarettes.  He has a 1 pack-year smoking history. He does not have any smokeless tobacco history on file. He reports that he drinks alcohol. His drug history not on file.  Family History: History reviewed. No pertinent family history.  Physical Exam: Filed Vitals:   10/27/11 0132  BP: 171/70  Pulse: 95  Temp: 102.8 F (39.3 C)  TempSrc: Oral  Resp: 28  SpO2: 95%   Blood pressure 171/70, pulse 95, temperature 102.8 F (39.3 C), temperature source Oral, resp. rate 28, SpO2 95.00%.   Gen: Massively obese M in ED stretcher, wife at bedside, he appears diaphoretic and wiped out,  non-toxic, but sweaty and dyspneic. Can speak full sentences and has no increased WOB HEENT: PERRL, EOMI, sclera/irises are clear and normal appearing. His mouth is normal appearing.  Neck: Massive and wholly unable to determine his jugular pressure Lungs: Suprisingly CTAB without w/c/r or other adventitious lung sounds.  Heart: Regular rhythm with audible S1/S2 and no apparent murmurs or gallops. No apparent rubs.  Abdomen: Very obese, but soft NT ND, and totally benign, large pannus Extrem: Warm without coolness or cyanosis. There is no evidence of volume overload, no BLE edema Neuro:  Sitting at bedside and moves extremities spontaneously, alert and conversant, grossly non- focal  Labs & Imaging Results for orders placed during the hospital encounter of 10/27/11 (from the past 48 hour(s))  CARDIAC PANEL(CRET KIN+CKTOT+MB+TROPI)     Status: Normal   Collection Time   10/27/11  1:56 AM      Component Value Range Comment   Total CK 74  7 - 232 (U/L)    CK, MB 1.5  0.3 - 4.0 (ng/mL)    Troponin I <0.30  <0.30 (ng/mL)    Relative Index RELATIVE INDEX IS INVALID  0.0 - 2.5    COMPREHENSIVE METABOLIC PANEL     Status: Abnormal   Collection Time   10/27/11  1:56 AM      Component Value Range Comment   Sodium 134 (*)  135 - 145 (mEq/L)    Potassium 3.5  3.5 - 5.1 (mEq/L)    Chloride 97  96 - 112 (mEq/L)    CO2 25  19 - 32 (mEq/L)    Glucose, Bld 192 (*) 70 - 99 (mg/dL)    BUN 14  6 - 23 (mg/dL)    Creatinine, Ser 4.09  0.50 - 1.35 (mg/dL)    Calcium 8.3 (*) 8.4 - 10.5 (mg/dL)    Total Protein 7.0  6.0 - 8.3 (g/dL)    Albumin 2.6 (*) 3.5 - 5.2 (g/dL)    AST 35  0 - 37 (U/L)    ALT 39  0 - 53 (U/L)    Alkaline Phosphatase 82  39 - 117 (U/L)    Total Bilirubin 0.7  0.3 - 1.2 (mg/dL)    GFR calc non Af Amer >90  >90 (mL/min)    GFR calc Af Amer >90  >90 (mL/min)   CBC     Status: Abnormal   Collection Time   10/27/11  1:56 AM      Component Value Range Comment   WBC 5.4  4.0 - 10.5 (K/uL)    RBC 3.96 (*) 4.22 - 5.81 (MIL/uL)    Hemoglobin 11.6 (*) 13.0 - 17.0 (g/dL)    HCT 81.1 (*) 91.4 - 52.0 (%)    MCV 87.4  78.0 - 100.0 (fL)    MCH 29.3  26.0 - 34.0 (pg)    MCHC 33.5  30.0 - 36.0 (g/dL)    RDW 78.2  95.6 - 21.3 (%)    Platelets 206  150 - 400 (K/uL)   PRO B NATRIURETIC PEPTIDE     Status: Abnormal   Collection Time   10/27/11  1:57 AM      Component Value Range Comment   BNP, POC 2516.0 (*) 0 - 125 (pg/mL)    Dg Chest 2 View  10/27/2011  *RADIOLOGY REPORT*  Clinical Data: Shortness of breath.  CHEST - 2 VIEW  Comparison: Chest radiograph performed 07/08/2004   Findings: The lungs are well-aerated.  Vascular congestion is noted, with bilateral central and basilar airspace opacities, compatible with pulmonary edema.  Bilateral pleural thickening appears chronic; no definite pleural effusion is seen.  The heart is significantly enlarged.  No acute osseous abnormalities are seen.  IMPRESSION: Vascular congestion and cardiomegaly, with bilateral central and bibasilar airspace opacities, compatible with pulmonary edema.  Original Report Authenticated By: Tonia Ghent, M.D.   ECG: NSR 77, normal axis, normal intervals, QRS narrow, no ST segment deviations, very diffuse  TWF. The most prominent thing about this ECG is that all complexes are very very small. The  largest complex is in II at 4mm.  Impression Present on Admission:  .Dyspnea .Diabetes mellitus .Congestive heart failure .Hypertension .Fever and chills  50yoM with h/o DM, CHF of unclear kind (presumed dCHF), HTN presents with several weeks of  increasing dyspnea, chest tightness, and fevers, chills, and sweats; found to have likely  pulmonary edema and fever to 102.8.   1. Dyspnea: Presumed acute on chronic diastolic HF exacerbation. 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 However, one interesting finding is an ECG with prominently  small complexes. This is likely due to his habitus, however based on ECG alone, cannot rule  out a  very large pericardial effusion.   Whether this is connected to his febrile illness I cannot determine at present. In the most likely  scenario, he has a separate illness leading to cardiopulmonary decompensation given his likely  poor baseline status. However, a myocarditis vs pericarditis type picture would tie both together.  Alternatively whatever is  seen in his CXR and is called as edema could in fact be a pneumonitis vs  PNA type picture.   - 2d echo in the am   - Diuresis: 20 mg IV Lasix q6 x3 doses, MD titrate thereafter  - If not improving with diuresis would consider chest CT given ? of inhalation injury, central and  bibasilar opacities, and pleural thickening on CXR.   - Continue home ASA 81 mg daily, Metoprolol 50 mg BID and Bystolic 10 mg daily (confirmed home  meds -- pt taking 2 beta blockers, consider adjusting this through d/c)  - Start Lisinopril   2. Fevers: I'm not sure the diagnosis at this point. Per him, they have lasted for about 8-10 days  now and he does not have any specific localizing symptoms to clinch the Dx, other than some  dysuria, and most recently over the weekend he did have vomiting and diarrhea to suggest a  gastroenteritis but this hasn't been ongoing over the whole course. The abnormal CXR could ? be  pneumonia vs pneumonitivs vs alveolitis. Could all be simple systemic viral infection, but 8-10d  seems a long time for this Dx.   - Limited infectious w/u at this point: UA, blood cultures, LFT's and lipase, influenza swab,  venous lactate  - Consider chest CT as above  - No ABx for now given unclear source  - TSH   3. Diabetes: Holding oral Janumet home meds, will cover with SSI while admitted   Presumed full code  Telemetry, [facility] team 7  Other plans as per orders.  Jaydis Duchene 10/27/2011, 5:12 AM

## 2011-10-27 NOTE — ED Notes (Signed)
Report called to [id number], pt to be transported.

## 2011-10-27 NOTE — ED Notes (Signed)
Pt states that he has had a fever for the past week. Pt states he has taken tylenol and ibuprofen for the past week with no relief. Pt states that he is having chills and HA. Pt denies CP, or body aches. Pt states that he is SOB. Pt alert and oriented and able to follow commands and move extremities.

## 2011-10-28 LAB — GLUCOSE, CAPILLARY
Glucose-Capillary: 155 mg/dL — ABNORMAL HIGH (ref 70–99)
Glucose-Capillary: 263 mg/dL — ABNORMAL HIGH (ref 70–99)
Glucose-Capillary: 120 mg/dL — ABNORMAL HIGH (ref 70–99)

## 2011-10-28 LAB — CBC
HCT: 36.8 % — ABNORMAL LOW (ref 39.0–52.0)
Hemoglobin: 12.1 g/dL — ABNORMAL LOW (ref 13.0–17.0)
MCV: 88.7 fL (ref 78.0–100.0)
RBC: 4.15 MIL/uL — ABNORMAL LOW (ref 4.22–5.81)
RDW: 14.2 % (ref 11.5–15.5)
WBC: 4.8 10*3/uL (ref 4.0–10.5)

## 2011-10-28 LAB — BASIC METABOLIC PANEL
BUN: 18 mg/dL (ref 6–23)
CO2: 28 mEq/L (ref 19–32)
Chloride: 99 mEq/L (ref 96–112)
Creatinine, Ser: 1.03 mg/dL (ref 0.50–1.35)
GFR calc Af Amer: >90 mL/min (ref >90)
Potassium: 4.4 mEq/L (ref 3.5–5.1)

## 2011-10-28 LAB — CARDIAC PANEL(CRET KIN+CKTOT+MB+TROPI): Relative Index: INVALID (ref 0.0–2.5)

## 2011-10-28 MED ORDER — MOXIFLOXACIN HCL IN NACL 400 MG/250ML IV SOLN
400.0000 mg | INTRAVENOUS | Status: DC
  Administered 2011-10-28 – 2011-10-29 (×2): 400 mg via INTRAVENOUS
  Filled 2011-10-28 (×2): qty 250

## 2011-10-28 NOTE — Progress Notes (Signed)
Utilization Review Completed.Jeremiah Perkins T12/02/2011   

## 2011-10-28 NOTE — Plan of Care (Signed)
Problem: Phase II Progression Outcomes Goal: Begin discharge teaching Outcome: Completed/Met Date Met:  10/28/11 Heart failure video viewed.  Living Better with HF packet given and zone tool and limiting salt and fluids discussed.  Margaretmary Bayley, RN

## 2011-10-28 NOTE — Progress Notes (Signed)
Patient ID: Jeremiah Perkins, male   DOB: 18-Aug-1961, 50 y.o.   MRN: [id number]  Subjective: No events overnight. Patient denies chest pain, shortness of breath, abdominal pain. Had bowel movement and reports ambulating.  Objective:  Vital signs in last 24 hours:  Filed Vitals:   10/27/11 1603 10/27/11 1700 10/27/11 2143 10/28/11 0415  BP:  111/78 109/64 112/72  Pulse:  68 61 57  Temp: 102.5 F (39.2 C) 99.7 F (37.6 C) 98.1 F (36.7 C) 99.4 F (37.4 C)  TempSrc: Oral     Resp:  22 20 20   Height:  6\' 3"  (1.905 m)    Weight:  134.99 kg (297 lb 9.6 oz)  135.8 kg (299 lb 6.2 oz)  SpO2:  97% 94% 99%    Intake/Output from previous day:   Intake/Output Summary (Last 24 hours) at 10/28/11 1233 Last data filed at 10/28/11 1102  Gross per 24 hour  Intake    420 ml  Output   2125 ml  Net  -1705 ml    Physical Exam: General: Alert, awake, oriented x3, in no acute distress. HEENT: No bruits, no goiter. Moist mucous membranes, no scleral icterus, no conjunctival pallor. Heart: Regular rate and rhythm, without murmurs, rubs, gallops. Lungs: Clear to auscultation bilaterally with minimal bibasilar crackles. No wheezing, no rhonchi, no rales.  Abdomen: Soft, nontender, nondistended, positive bowel sounds. Extremities: No clubbing cyanosis, + 1 bilateral pitting edema,  positive pedal pulses. Neuro: Grossly intact, nonfocal.  Lab Results:  Basic Metabolic Panel:    Component Value Date/Time   NA 138 10/28/2011 0700   K 4.4 10/28/2011 0700   CL 99 10/28/2011 0700   CO2 28 10/28/2011 0700   BUN 18 10/28/2011 0700   CREATININE 1.03 10/28/2011 0700   GLUCOSE 152* 10/28/2011 0700   CALCIUM 8.7 10/28/2011 0700   CBC:    Component Value Date/Time   WBC 4.8 10/28/2011 0700   HGB 12.1* 10/28/2011 0700   HCT 36.8* 10/28/2011 0700   PLT 246 10/28/2011 0700   MCV 88.7 10/28/2011 0700   NEUTROABS 2.2 10/27/2011 1310   LYMPHSABS 0.8 10/27/2011 1310   MONOABS 0.4 10/27/2011 1310   EOSABS 0.0  10/27/2011 1310   BASOSABS 0.0 10/27/2011 1310      Lab 10/28/11 0700 10/27/11 1310 10/27/11 0609 10/27/11 0156  WBC 4.8 3.5* 4.8 5.4  HGB 12.1* 10.4* 11.0* 11.6*  HCT 36.8* 33.4* 33.7* 34.6*  PLT 246 219 200 206  MCV 88.7 88.6 87.5 87.4  MCH 29.2 27.6 28.6 29.3  MCHC 32.9 31.1 32.6 33.5  RDW 14.2 14.1 14.1 14.0  LYMPHSABS -- 0.8 -- --  MONOABS -- 0.4 -- --  EOSABS -- 0.0 -- --  BASOSABS -- 0.0 -- --  BANDABS -- -- -- --    Lab 10/28/11 0700 10/27/11 1310 10/27/11 0609 10/27/11 0156  NA 138 138 -- 134*  K 4.4 3.6 -- 3.5  CL 99 99 -- 97  CO2 28 28 -- 25  GLUCOSE 152* 144* -- 192*  BUN 18 13 -- 14  CREATININE 1.03 0.96 0.99 0.94  CALCIUM 8.7 8.1* -- 8.3*  MG -- 2.1 -- --   No results found for this basename: INR:5,PROTIME:5 in the last 168 hours Cardiac markers:  Lab 10/28/11 0700 10/27/11 2129 10/27/11 1310  CKMB 1.8 1.7 1.9  TROPONINI <0.30 <0.30 <0.30  MYOGLOBIN -- -- --    Lab 10/27/11 0157  POCBNP 2516.0*   Recent Results (from the past 240 hour(s))  CULTURE, BLOOD (ROUTINE X 2)     Status: Normal (Preliminary result)   Collection Time   10/27/11  1:12 PM      Component Value Range Status Comment   Specimen Description BLOOD ARM LEFT   Final    Special Requests BOTTLES DRAWN AEROBIC AND ANAEROBIC 10CC   Final    Setup Time [id number]   Final    Culture     Final    Value:        BLOOD CULTURE RECEIVED NO GROWTH TO DATE CULTURE WILL BE HELD FOR 5 DAYS BEFORE ISSUING A FINAL NEGATIVE REPORT   Report Status PENDING   Incomplete   CULTURE, BLOOD (ROUTINE X 2)     Status: Normal (Preliminary result)   Collection Time   10/27/11  1:23 PM      Component Value Range Status Comment   Specimen Description BLOOD HAND LEFT   Final    Special Requests BOTTLES DRAWN AEROBIC AND ANAEROBIC 10CC   Final    Setup Time [id number]   Final    Culture     Final    Value:        BLOOD CULTURE RECEIVED NO GROWTH TO DATE CULTURE WILL BE HELD FOR 5 DAYS BEFORE ISSUING A FINAL  NEGATIVE REPORT   Report Status PENDING   Incomplete     Studies/Results: Dg Chest 2 View  10/27/2011  IMPRESSION: Vascular congestion and cardiomegaly, with bilateral central and bibasilar airspace opacities, compatible with pulmonary edema.   Ct Chest Wo Contrast  10/27/2011   IMPRESSION:  1.  Geographical areas of ground-glass opacity in both lungs. Differential considerations include infection, inflammation and interstitial edema. 2.  Subcentimeter, subpleural nodules in both lungs.  Differential considerations include subpleural lymph nodes and granulomas.  The appearance is not typical for metastatic disease. 3.  Minimal mediastinal adenopathy, most likely reactive.    Medications: Scheduled Meds:   . acetaminophen      . aspirin  81 mg Oral Daily  . buPROPion  150 mg Oral Daily  . furosemide  40 mg Intravenous BID  . heparin  5,000 Units Subcutaneous Q8H  . insulin aspart  0-20 Units Subcutaneous TID WC  . lisinopril  5 mg Oral Daily  . metoprolol  50 mg Oral BID  . nebivolol  10 mg Oral To Minor  . nebivolol  10 mg Oral Daily  . potassium chloride  40 mEq Oral Daily  . sodium chloride  3 mL Intravenous Q12H  . DISCONTD: furosemide  20 mg Intravenous Q6H  . DISCONTD: lisinopril  5 mg Oral To Minor  . DISCONTD: nebivolol  10 mg Oral Daily   Continuous Infusions:  PRN Meds:.sodium chloride, acetaminophen, ondansetron (ZOFRAN) IV, sodium chloride, zolpidem  Assessment/Plan:  Principal Problem:  *Dyspnea  - most likely secondary to CHF exacerbation as is suggested by clinical exam findings and elevated BNP along with CXR findings  - pt clinically improving already given one dose of Lasix 80 mg IV  - given Tmax overnight of 102 F, I am also worried for underlying infectious etiology - will treat empirically with Avelox IV - will continue to monitor on telemetry  - three sets of cardiac enzymes are negative, follow up on 2 D ECHO which has been done but results still  pending - follow strict I's and O's  - follow up on TSH  Active Problem  Diabetes mellitus  - follow up on A1C results and initiate regimen if indicated  Hypertension  - currently well controlled and even slightly on low side at times - continue home dose regimen   Congestive heart failure  - follow up on 2 D ECHO to determine etiology  Disposition - anticipate d/c in 1-2 days if clinically improving - he will go home when ready for d/c    LOS: 1 day   MAGICK-Kalah Pflum 10/28/2011, 12:33 PM

## 2011-10-29 LAB — URINE MICROSCOPIC-ADD ON

## 2011-10-29 LAB — CBC
HCT: 36 % — ABNORMAL LOW (ref 39.0–52.0)
MCHC: 32.5 g/dL (ref 30.0–36.0)
MCV: 88.2 fL (ref 78.0–100.0)
Platelets: 254 10*3/uL (ref 150–400)
RDW: 14.1 % (ref 11.5–15.5)
WBC: 5.3 10*3/uL (ref 4.0–10.5)

## 2011-10-29 LAB — GLUCOSE, CAPILLARY: Glucose-Capillary: 102 mg/dL — ABNORMAL HIGH (ref 70–99)

## 2011-10-29 LAB — URINALYSIS, ROUTINE W REFLEX MICROSCOPIC
Glucose, UA: NEGATIVE mg/dL
Nitrite: NEGATIVE
Specific Gravity, Urine: 1.013 (ref 1.005–1.030)
pH: 6 (ref 5.0–8.0)

## 2011-10-29 LAB — BASIC METABOLIC PANEL
BUN: 20 mg/dL (ref 6–23)
Creatinine, Ser: 0.95 mg/dL (ref 0.50–1.35)
GFR calc Af Amer: >90 mL/min (ref >90)
GFR calc non Af Amer: >90 mL/min (ref >90)
Potassium: 4.2 mEq/L (ref 3.5–5.1)

## 2011-10-29 MED ORDER — FUROSEMIDE 40 MG PO TABS
40.0000 mg | ORAL_TABLET | Freq: Every day | ORAL | Status: DC
  Administered 2011-10-29 – 2011-10-30 (×2): 40 mg via ORAL
  Filled 2011-10-29 (×3): qty 1

## 2011-10-29 MED ORDER — MOXIFLOXACIN HCL 400 MG PO TABS
400.0000 mg | ORAL_TABLET | Freq: Every day | ORAL | Status: DC
  Administered 2011-10-29: 400 mg via ORAL
  Filled 2011-10-29 (×3): qty 1

## 2011-10-29 NOTE — Progress Notes (Signed)
Subjective: Shortness of breath better. Relates hematuria, started today. Relates some burning sensation with urination. Objective: Filed Vitals:   10/28/11 1532 10/28/11 2100 10/29/11 0500 10/29/11 1500  BP: 107/72 99/65 111/75 146/86  Pulse: 51 52 53 55  Temp: 98.3 F (36.8 C) 98.8 F (37.1 C) 97.6 F (36.4 C) 98 F (36.7 C)  TempSrc: Oral Oral Oral   Resp: 19 20 20 20   Height:      Weight:   134.537 kg (296 lb 9.6 oz)   SpO2: 95% 97% 98% 98%   Weight change: -0.454 kg (-1 lb)  Intake/Output Summary (Last 24 hours) at 10/29/11 1539 Last data filed at 10/29/11 1500  Gross per 24 hour  Intake   1236 ml  Output   2075 ml  Net   -839 ml    General: Alert, awake, oriented x3, in no acute distress.  HEENT: No bruits, no goiter.  Heart: Regular rate and rhythm, without murmurs, rubs, gallops.  Lungs:  bilateral air movement, no significant crackles. .  Abdomen: Soft, nontender, nondistended, positive bowel sounds.  Neuro: Grossly intact, nonfocal.   Lab Results:  Basename 10/29/11 0535 10/28/11 0700 10/27/11 1310  NA 138 138 --  K 4.2 4.4 --  CL 102 99 --  CO2 27 28 --  GLUCOSE 153* 152* --  BUN 20 18 --  CREATININE 0.95 1.03 --  CALCIUM 8.6 8.7 --  MG -- -- 2.1  PHOS -- -- --    Basename 10/27/11 1310 10/27/11 0156  AST 29 35  ALT 35 39  ALKPHOS 75 82  BILITOT 0.8 0.7  PROT 7.4 7.0  ALBUMIN 2.5* 2.6*    Basename 10/27/11 1310  LIPASE 42  AMYLASE --    Basename 10/29/11 0535 10/28/11 0700 10/27/11 1310  WBC 5.3 4.8 --  NEUTROABS -- -- 2.2  HGB 11.7* 12.1* --  HCT 36.0* 36.8* --  MCV 88.2 88.7 --  PLT 254 246 --    Basename 10/28/11 0700 10/27/11 2129 10/27/11 1310  CKTOTAL 63 65 66  CKMB 1.8 1.7 1.9  CKMBINDEX -- -- --  TROPONINI <0.30 <0.30 <0.30    Basename 10/27/11 0157  POCBNP 2516.0*    Basename 10/27/11 1310  HGBA1C 7.2*    Basename 10/27/11 1310  CHOL 144  HDL 20*  LDLCALC 94  TRIG 161*  CHOLHDL 7.2  LDLDIRECT --     Basename 10/27/11 1310  TSH 3.181  T4TOTAL --  T3FREE --  THYROIDAB --    Micro Results: Recent Results (from the past 240 hour(s))  CULTURE, BLOOD (ROUTINE X 2)     Status: Normal (Preliminary result)   Collection Time   10/27/11  1:12 PM      Component Value Range Status Comment   Specimen Description BLOOD ARM LEFT   Final    Special Requests BOTTLES DRAWN AEROBIC AND ANAEROBIC 10CC   Final    Setup Time [id number]   Final    Culture     Final    Value:        BLOOD CULTURE RECEIVED NO GROWTH TO DATE CULTURE WILL BE HELD FOR 5 DAYS BEFORE ISSUING A FINAL NEGATIVE REPORT   Report Status PENDING   Incomplete   CULTURE, BLOOD (ROUTINE X 2)     Status: Normal (Preliminary result)   Collection Time   10/27/11  1:23 PM      Component Value Range Status Comment   Specimen Description BLOOD HAND LEFT   Final  Special Requests BOTTLES DRAWN AEROBIC AND ANAEROBIC 10CC   Final    Setup Time [id number]   Final    Culture     Final    Value:        BLOOD CULTURE RECEIVED NO GROWTH TO DATE CULTURE WILL BE HELD FOR 5 DAYS BEFORE ISSUING A FINAL NEGATIVE REPORT   Report Status PENDING   Incomplete     Studies/Results: No results found. CT Chest  1. Geographical areas of ground-glass opacity in both lungs.  Differential considerations include infection, inflammation and  interstitial edema.  2. Subcentimeter, subpleural nodules in both lungs. Differential  considerations include subpleural lymph nodes and granulomas. The  appearance is not typical for metastatic disease.  3. Minimal mediastinal adenopathy, most likely reactive.  Medications: I have reviewed the patient's current medications.   Patient Active Hospital Problem List:  Dyspnea (10/27/2011) Probably secondary to HF exacerbation and questionable infection PNA.   Diabetes mellitus ()  Hypertension ()  Continue with lasix, lisinopril.  Continue with bystolic.  Congestive heart failure (), diastolic acute  exacerbation: Patient negative 4 l. I will change lasix to 40 mg po daily. I ask nurse to find 2 ECHO result.   Fever and chills (10/27/2011):  Resolved. Treating for PNA. Change Avelox to oral. Day 2 of antibiotics.   Subcentimeter, subpleural nodules in both lungs. Differential  considerations include subpleural lymph nodes and granulomas. The  appearance is not typical for metastatic disease. Patient aware of results. He will need follow up imaging.   Hematuria:  Stop aspirin, heparin. Check UA.  If stable and hematuria resolved will consider discharge 12-06.   LOS: 2 days   Future Yeldell M.D.  [facility] 10/29/2011, 3:39 PM

## 2011-10-30 LAB — CBC
MCH: 28.3 pg (ref 26.0–34.0)
Platelets: 300 10*3/uL (ref 150–400)
RBC: 4.31 MIL/uL (ref 4.22–5.81)

## 2011-10-30 LAB — BASIC METABOLIC PANEL
CO2: 27 mEq/L (ref 19–32)
Calcium: 8.8 mg/dL (ref 8.4–10.5)
GFR calc Af Amer: >90 mL/min (ref >90)
GFR calc non Af Amer: >90 mL/min (ref >90)
Sodium: 140 mEq/L (ref 135–145)

## 2011-10-30 LAB — URINE CULTURE
Colony Count: NO GROWTH
Culture  Setup Time: 201212052004

## 2011-10-30 LAB — GLUCOSE, CAPILLARY: Glucose-Capillary: 279 mg/dL — ABNORMAL HIGH (ref 70–99)

## 2011-10-30 MED ORDER — FUROSEMIDE 40 MG PO TABS: 40.0000 mg | ORAL_TABLET | Freq: Every day | ORAL | Status: DC

## 2011-10-30 MED ORDER — MOXIFLOXACIN HCL 400 MG PO TABS: 400.0000 mg | ORAL_TABLET | Freq: Every day | ORAL | Status: DC

## 2011-10-30 MED ORDER — CIPROFLOXACIN HCL 500 MG PO TABS: 500.0000 mg | ORAL_TABLET | Freq: Two times a day (BID) | ORAL | Status: AC

## 2011-10-30 MED ORDER — POTASSIUM CHLORIDE CRYS ER 20 MEQ PO TBCR: 40.0000 meq | EXTENDED_RELEASE_TABLET | Freq: Every day | ORAL | Status: DC

## 2011-10-30 NOTE — Discharge Summary (Signed)
Admit date: 10/27/2011 Discharge date: 10/30/2011  Primary Care Physician:  No primary provider on file.   Discharge Diagnoses:   Acute diastolic heart failure exacerbation Questionable pneumonia Hematuria Sub pleural nodules in both lungs  Past Medical History   Diagnosis  Date   .  Diabetes mellitus    .  Hypertension    .  Congestive heart failure      No echo data available. Myocardial perfusion 2005 with EF 61%, presumed diastolic HF   .  Diverticulitis    .  Gout         DISCHARGE MEDICATION: Current Discharge Medication List    START taking these medications   Details  Ciprofloxacin 500 mg  1 tablet by mouth twice a day.     potassium chloride SA (K-DUR,KLOR-CON) 20 MEQ tablet Take 2 tablets (40 mEq total) by mouth daily. Qty: 30 tablet, Refills: 0      CONTINUE these medications which have CHANGED   Details  furosemide (LASIX) 40 MG tablet Take 1 tablet (40 mg total) by mouth daily. Qty: 30 tablet, Refills: 0      CONTINUE these medications which have NOT CHANGED   Details  buPROPion (WELLBUTRIN XL) 150 MG 24 hr tablet Take 150 mg by mouth daily.      nebivolol (BYSTOLIC) 10 MG tablet Take 10 mg by mouth daily.      sitaGLIPtan-metformin (JANUMET) 50-1000 MG per tablet Take 1 tablet by mouth 2 (two) times daily with a meal.        STOP taking these medications     aspirin 81 MG tablet      aspirin-sod bicarb-citric acid (ALKA-SELTZER) 325 MG TBEF      metoprolol (LOPRESSOR) 50 MG tablet            Consults: none   SIGNIFICANT DIAGNOSTIC STUDIES:  Dg Chest 2 View  10/27/2011  *RADIOLOGY REPORT*  Clinical Data: Shortness of breath.  CHEST - 2 VIEW  Comparison: Chest radiograph performed 07/08/2004  Findings: The lungs are well-aerated.  Vascular congestion is noted, with bilateral central and basilar airspace opacities, compatible with pulmonary edema.  Bilateral pleural thickening appears chronic; no definite pleural effusion is seen.  The  heart is significantly enlarged.  No acute osseous abnormalities are seen.  IMPRESSION: Vascular congestion and cardiomegaly, with bilateral central and bibasilar airspace opacities, compatible with pulmonary edema.  Original Report Authenticated By: Tonia Ghent, M.D.   Ct Chest Wo Contrast  10/27/2011  *RADIOLOGY REPORT*  Clinical Data: Shortness of breath.  Fever. 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  Symptoms improving following reinstitution of Lasix.  CT CHEST WITHOUT CONTRAST  Technique:  Multidetector CT imaging of the chest was performed following the standard protocol without IV contrast.  Comparison: Chest radiographs obtained earlier today.  Chest CT reports dated 12/05/1999 and 10/29/1999.  Findings: Geographical areas of ground glass opacity throughout both lungs.  Similar findings were described on the right on the examination dated 10/29/1999.  These were reportedly subsequently resolved on 12/05/1999.  Multiple mildly enlarged mediastinal lymph nodes.  The largest includes a 2.0 x 1.5 cm subcarinal node on the right on image number 29 and a 2.1 x 0.9 cm AP window lymph node on image number 20.  There were shotty mediastinal nodes described on 10/29/1999 and reportedly resolved on 12/05/1999.  Also demonstrated are scattered subpleural nodules in both lungs. These all measure less than 1 cm in maximum diameter and were not previously described.  Borderline enlarged heart.  Unremarkable noncontrasted appearance of the upper abdomen.  Thoracic spine degenerative changes.  IMPRESSION:  1.  Geographical areas of ground-glass opacity in both lungs. Differential considerations include infection, inflammation and interstitial edema. 2.  Subcentimeter, subpleural nodules in both lungs.  Differential considerations include subpleural lymph nodes and granulomas.  The appearance is not typical for metastatic disease. 3.  Minimal mediastinal adenopathy, most likely reactive.  Original  Report Authenticated By: Darrol Angel, M.D.     ECHO: - Left ventricle: The cavity size was normal. There was moderate concentric hypertrophy. Systolic function was normal. The estimated ejection fraction was in the range of 50% to 55%. Although no diagnostic regional wall motion abnormality was identified, this possibility cannot be completely excluded on the basis of this study. There was a reduced contribution of atrial contraction to ventricular filling, due to increased ventricular diastolic pressure. - Left atrium: The atrium was mildly to moderately dilated. - Right ventricle: The cavity size was mildly dilated. Wall thickness was normal. - Pulmonary arteries: Systolic pressure could not be accurately estimated, but is likely elevated based on other findings from the echocardiogram.        Recent Results (from the past 240 hour(s))  CULTURE, BLOOD (ROUTINE X 2)     Status: Normal (Preliminary result)   Collection Time   10/27/11  1:12 PM      Component Value Range Status Comment   Specimen Description BLOOD ARM LEFT   Final    Special Requests BOTTLES DRAWN AEROBIC AND ANAEROBIC 10CC   Final    Setup Time [id number]   Final    Culture     Final    Value:        BLOOD CULTURE RECEIVED NO GROWTH TO DATE CULTURE WILL BE HELD FOR 5 DAYS BEFORE ISSUING A FINAL NEGATIVE REPORT   Report Status PENDING   Incomplete   CULTURE, BLOOD (ROUTINE X 2)     Status: Normal (Preliminary result)   Collection Time   10/27/11  1:23 PM      Component Value Range Status Comment   Specimen Description BLOOD HAND LEFT   Final    Special Requests BOTTLES DRAWN AEROBIC AND ANAEROBIC 10CC   Final    Setup Time [id number]   Final    Culture     Final    Value:        BLOOD CULTURE RECEIVED NO GROWTH TO DATE CULTURE WILL BE HELD FOR 5 DAYS BEFORE ISSUING A FINAL NEGATIVE REPORT   Report Status PENDING   Incomplete     BRIEF ADMITTING H & P: 50yoM with h/o DM, CHF of unclear kind  (presumed dCHF), HTN presents with several weeks of  increasing dyspnea, chest tightness, and fevers, chills, and sweats; found to have likely  pulmonary edema and fever to 102.8.  Of note, pt was admitted 10/1999 (12 years ago) for 7-10d history of diffuse aches and pains  associated with malaise and chills thought consistent with viral syndrome. He was seen to have  edema and cardiomegaly on CXR at that time as well. He had a chest CT at that time with shotty  nodes, one pretracheal node at 1.2cm, and right perihilar alveolitis question of bronchopneumonia  vs aspiration. He was diuresed and was breathing much better. He was set up for cardiolite as  outpt, which we don't really have the results of. What we do have in our records is a nuclear  myocardial perfusion scan  01/2004 that was suboptimal likely due to his large habitus, but from  what was seen, no definite perfusion defect was seen and LVEF calculated at 61%. He does not  follow with a cardiologist.  Pt states he was taken off Lasix in January as he was told he doesn't need it, which he appears to  disagree with. More recently he also states that several weeks ago his BP meds were changed around  and he was stopped on Lisinopril and started something else. He thinks this triggered his  decompensation, such that for the past few weeks he's been more dyspnea, can't catch his breath,  getting winded just walking in the parking lot to his job as an [employer]. He hasn't  noticed much leg swelling. He cannot lay on his back at baseline, so orthopnea unclear. He went to  his primary provider and "begged" her to restart Lasix which was done a week ago, and sent him to  get an echo which was scheduled for this morning but he was decompensating so much he came to ED  instead.  He also states that since the Wednesday before last (about 8-10 days ago) he started having  fevers, chills, and "sweating up the bed" that have been off/on but  getting worse such that  Saturday his temp at home was 103.7. He's had occasional cough but non-productive, no myalgias or  MSK aches, no abdominal pain, although just in the past few days he's had vomiting and diarrhea  (non-bloody). He does endorse dysuria esp. in the setting of increased urination with Lasix. He's  had some headaches. 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  In the ED pt was febrile to 102.8, had HTN to 171/70, but p95, oxygenating well. Chemistry panel  with Na 134, renal 14/0.94, glucose 192, LFT's normal, negative cardiac enzymes x1, WBC 5.4, Hct  34.6, plts 206, BNP 2516, and CXR showed vascular congestion with bilateral central and bibasilar  airspace opacities, compatible with pulmonary edema; also with bilateral pleural thickening that  appeared chronic, and significant cardiomegaly. Pt was given 1g tylenol, 80mg  IV Lasix, SL NTG  ointment, and Zofran.   Hospital Course: 1- Acute diastolic heart failure exacerbation: Patient was admitted to telemetry. He presented with shortness of breath, BNP was mildly elevated at 2516, chest x-ray showed vascular congestion and cardiomegaly, with bilateral a space of posses compatible with pulmonary edema. Patient was started on IV Lasix 20 mg every 6 hours x3 doses. Then on he was changed to 40 mg twice a day. He will be discharged on 40 mg. A shortness of breath has improved. 2-D echo showed diastolic dysfunction. Patient would need to follow up with his primary care physician and consider referred her to cardiologist. He was negative prior to discharge of 4.5 L. The day of discharge patient was feeling better,  shortness of breath has resolved. His heart failure exacerbation was probably secondary to infectious  process.  2-PNA: Patient had a fever at 102. A CT chest  show groundglass opacity in both lung, differential consideration included infectious, inflammatory and interstitial edema. Patient was started on Avelox. He received a total of 3 days of Avelox. The 2 hematuria and concern for urinary tract. Patient will be discharged on ciprofloxacin, to cover  for both of pulmonary and urinary tract infection process. He will received a total of 6 days of antibiotics.  3-Subcentimeter, subpleural nodules in both lungs. Differential  considerations include subpleural lymph nodes and granulomas. The  appearance is not typical for metastatic disease. Patient aware of results. He will need follow up imaging.   4-Hematuria: Patient developed hematuria while in the hospital. A UA showed too numerous to count red blood cell. He relates some dysuria. I will treat empirically for urinary tract infection with ciprofloxacin. He would need a repeated UA to document resolution of hematuria. If hematuria persists he he will need to be referred to an urologist. Not as patient was on heparin for DVT prophylaxis and aspirin. This medication was discontinued. He would need to follow with his primary care physician to restart aspirin. His urine is clear.    5 Hypertension: Patient was started on lisinopril but this medication was stopped  due to soft systolic blood pressure. He would be discharged only on Bystolic. Metoprolol was a stop. I don't see a reason why he should be on 2 beta blockers.   Disposition and Follow-up:  Patient will need to be met to follow all potassium level and renal function. We need to resume his aspirin, if no more hematuria when he follow with his primary care physician. He would need a repeated urinalysis  To document resolution of hematuria. He would need a repeated CT chest the 2 subpleural nodules. Him might benefit to be referred to a cardiologist. The date of discharge patient was in improved  condition.  Discharge Orders    Future Orders Please Complete By Expires   Diet - low sodium heart healthy      Increase activity slowly        Follow-up Information    Follow up with [facility] on 11/06/2011. (10AM, dr Yetta Barre ph [phone])           DISCHARGE EXAM: .The physical exam is generally normal. He appears well, alert and oriented x 3, pleasant and cooperative. Vitals as noted. ENT normal, neck supple and free of adenopathy, or masses.  No thyromegaly or carotid bruits. Cranial nerves and fundi normal. Lungs are clear to auscultation. Heart sounds are normal, no murmurs, clicks, gallops or rubs. Abdomen is soft, no tenderness, masses or organomegaly. Extremities, peripheral pulses and reflexes are normal. Screening neurological exam is normal without focal findings. Skin is normal without suspicious lesions.   Blood pressure 97/62, pulse 53, temperature 98.2 F (36.8 C), temperature source Oral, resp. rate 18, height 6\' 3"  (1.905 m), weight 134.129 kg (295 lb 11.2 oz), SpO2 97.00%.   Basename 10/30/11 0739 10/29/11 0535 10/27/11 1310  NA 140 138 --  K 4.3 4.2 --  CL 104 102 --  CO2 27 27 --  GLUCOSE 146* 153* --  BUN 19 20 --  CREATININE 0.90 0.95 --  CALCIUM 8.8 8.6 --  MG -- -- 2.1  PHOS -- -- --    Basename 10/27/11 1310  AST 29  ALT 35  ALKPHOS 75  BILITOT 0.8  PROT 7.4  ALBUMIN 2.5*    Basename 10/27/11 1310  LIPASE 42  AMYLASE --    Basename 10/30/11 0739 10/29/11 0535 10/27/11 1310  WBC 5.6 5.3 --  NEUTROABS -- -- 2.2  HGB 12.2* 11.7* --  HCT 38.2* 36.0* --  MCV 88.6 88.2 --  PLT 300 254 --    Signed: Edom Schmuhl M.D. 10/30/2011, 11:13 AM

## 2011-10-30 NOTE — Progress Notes (Signed)
   CARE MANAGEMENT NOTE 10/30/2011  Patient:  Jeremiah Perkins, Jeremiah Perkins   Account Number:  [id number]  Date Initiated:  10/28/2011  Documentation initiated by:  Alvira Philips Assessment:   50 yr-old male adm with CHF; lives with spouse, independent PTA.     Action/Plan:   Anticipated DC Date:  10/31/2011   Anticipated DC Plan:  HOME/SELF CARE      DC Planning Services  CM consult  Follow-up appt scheduled      Choice offered to / List presented to:          [facility] arranged  HH - 11 Patient Refused      Status of service:   Medicare Important Message given?   (If response is "NO", the following Medicare IM given date fields will be blank) Date Medicare IM given:   Date Additional Medicare IM given:    Discharge Disposition:  HOME/SELF CARE  Per UR Regulation:  Reviewed for med. necessity/level of care/duration of stay  Comments:  Primary Care provided by Ovidio Kin, PA  Contact:  Shed Nixon, spouse  [phone] appt w dr Maudry Mayhew on 12-13 at 10am, debbie Deen Deguia rn,bsn  10/28/11 1115 Henrietta Mayo RN MSN CCM Pt states he feels exac is possibly related to infectious process, as he is still spiking fevers - started on 12/1 when he had sore gums.  Will discuss with MD today.  He is independent, currently employed full-time.

## 2011-11-02 LAB — CULTURE, BLOOD (ROUTINE X 2): Culture: NO GROWTH

## 2011-11-29 ENCOUNTER — Other Ambulatory Visit: Payer: Self-pay | Admitting: Internal Medicine

## 2012-11-08 ENCOUNTER — Other Ambulatory Visit: Payer: Self-pay | Admitting: Internal Medicine

## 2012-11-08 MED ORDER — COLCHICINE 0.6 MG PO TABS: ORAL_TABLET | ORAL | Status: DC

## 2012-11-08 MED ORDER — BUPROPION HCL ER (XL) 300 MG PO TB24: 150.0000 mg | ORAL_TABLET | Freq: Every day | ORAL | Status: DC

## 2012-11-08 MED ORDER — INDOMETHACIN 50 MG PO CAPS: 50.0000 mg | ORAL_CAPSULE | Freq: Three times a day (TID) | ORAL | Status: DC

## 2012-11-08 NOTE — Progress Notes (Signed)
Pt to see Simeon Craft as new pt 12/01/2012. Needed indomethacin, colchicine, and bupropion refills to last until then. Reviewed recent D/C summary and colchicine and indo were not on D/C summary. Pt had paperwork from PCP 10/13 appt and were on med list so refilled one month supply. Not comfortable with indo TID every day so new PCP will need to address. Bupropion on D/C summary was 150 and not 300 but last PCP did have him on 300 so refilled 300 for one month.

## 2012-11-11 ENCOUNTER — Ambulatory Visit (INDEPENDENT_AMBULATORY_CARE_PROVIDER_SITE_OTHER): Payer: No Typology Code available for payment source | Admitting: Internal Medicine

## 2012-11-11 ENCOUNTER — Encounter: Payer: Self-pay | Admitting: Internal Medicine

## 2012-11-11 VITALS — BP 148/84 | HR 69 | Temp 96.9°F | Ht 67.0 in | Wt 289.5 lb

## 2012-11-11 DIAGNOSIS — K5790 Diverticulosis of intestine, part unspecified, without perforation or abscess without bleeding: Secondary | ICD-10-CM | POA: Insufficient documentation

## 2012-11-11 DIAGNOSIS — Z7689 Persons encountering health services in other specified circumstances: Secondary | ICD-10-CM | POA: Insufficient documentation

## 2012-11-11 DIAGNOSIS — F528 Other sexual dysfunction not due to a substance or known physiological condition: Secondary | ICD-10-CM | POA: Insufficient documentation

## 2012-11-11 DIAGNOSIS — E118 Type 2 diabetes mellitus with unspecified complications: Secondary | ICD-10-CM

## 2012-11-11 DIAGNOSIS — M109 Gout, unspecified: Secondary | ICD-10-CM

## 2012-11-11 DIAGNOSIS — G4733 Obstructive sleep apnea (adult) (pediatric): Secondary | ICD-10-CM | POA: Insufficient documentation

## 2012-11-11 DIAGNOSIS — E785 Hyperlipidemia, unspecified: Secondary | ICD-10-CM

## 2012-11-11 DIAGNOSIS — Z79899 Other long term (current) drug therapy: Secondary | ICD-10-CM

## 2012-11-11 DIAGNOSIS — F329 Major depressive disorder, single episode, unspecified: Secondary | ICD-10-CM | POA: Insufficient documentation

## 2012-11-11 DIAGNOSIS — I509 Heart failure, unspecified: Secondary | ICD-10-CM

## 2012-11-11 DIAGNOSIS — M1990 Unspecified osteoarthritis, unspecified site: Secondary | ICD-10-CM | POA: Insufficient documentation

## 2012-11-11 DIAGNOSIS — F419 Anxiety disorder, unspecified: Secondary | ICD-10-CM | POA: Insufficient documentation

## 2012-11-11 DIAGNOSIS — I10 Essential (primary) hypertension: Secondary | ICD-10-CM

## 2012-11-11 DIAGNOSIS — E1142 Type 2 diabetes mellitus with diabetic polyneuropathy: Secondary | ICD-10-CM | POA: Insufficient documentation

## 2012-11-11 LAB — LIPID PANEL
HDL: 35 mg/dL — ABNORMAL LOW (ref >39)
LDL Cholesterol: 95 mg/dL (ref 0–99)
Total CHOL/HDL Ratio: 4.4 Ratio
VLDL: 23 mg/dL (ref 0–40)

## 2012-11-11 LAB — COMPREHENSIVE METABOLIC PANEL
ALT: 13 U/L (ref 0–53)
AST: 16 U/L (ref 0–37)
Albumin: 3.7 g/dL (ref 3.5–5.2)
Alkaline Phosphatase: 76 U/L (ref 39–117)
CO2: 24 mEq/L (ref 19–32)
Potassium: 4.6 mEq/L (ref 3.5–5.3)
Sodium: 142 mEq/L (ref 135–145)
Total Protein: 6.7 g/dL (ref 6.0–8.3)

## 2012-11-11 LAB — PRO B NATRIURETIC PEPTIDE: Pro B Natriuretic peptide (BNP): 720.1 pg/mL — ABNORMAL HIGH (ref <126)

## 2012-11-11 LAB — GLUCOSE, CAPILLARY: Glucose-Capillary: 176 mg/dL — ABNORMAL HIGH (ref 70–99)

## 2012-11-11 LAB — TSH: TSH: 1.906 u[IU]/mL (ref 0.350–4.500)

## 2012-11-11 LAB — POCT GLYCOSYLATED HEMOGLOBIN (HGB A1C): Hemoglobin A1C: 6

## 2012-11-11 NOTE — Assessment & Plan Note (Addendum)
Pertinent Labs: Lab Results  Component Value Date   HGBA1C 6.0 11/11/2012   HGBA1C 7.2* 10/27/2011   CREATININE 0.90 10/30/2011   CHOL 144 10/27/2011   HDL 20* 10/27/2011   TRIG 150* 10/27/2011    Assessment: Disease Control: fair control  Progress toward goals: unchanged  Barriers to meeting goals: financial need  Microvascular complications: peripheral neuropathy and impotence  Macrovascular complications: none  On aspirin: Yes    On statin: Yes  On ACE-I/ ARB: Yes    Patient is compliant most of the time with prescribed medications.   Plan: Glucometer log was not reviewed today, as pt did not have glucometer available for review.   continue current medications  reminded to bring blood glucose meter & log to each visit  Records from prior PCP is to be requested - request form signed - want to assess date of last PCV immunization (states he has had this performed)  Foot exam today.  Educational resources provided: brochure  Self management tools provided:    Home glucose monitoring recommendation: once a day before breakfast

## 2012-11-11 NOTE — Assessment & Plan Note (Signed)
Assessment: This issues was not addressed during the visit, aside from him mentioning that his PCP thought this may be due to his diabetes or his medications such as lasix or simvastatin. Then, as I was leaving the room, he handed me paperwork for drug company funding to cover his Viagra prescription. We did not discuss his impotence in detail, I did not do a rectal or prostate exam or genital exam. I did not discuss with him organic versus nonorganic causes of this issue. I therefore, do not feel comfortable prescribing long-term medication or getting long-term coverage for this medication until he can be evaluated fully.  Plan:      I will not fill out his paperwork for drug-company funding today.  I asked our nurse to offer 1 month supply to last until his next visit if needed - he will be called and informed of the above mentioned.

## 2012-11-11 NOTE — Patient Instructions (Addendum)
General Instructions:  Please follow-up at the clinic in 2 weeks, at which time we will reevaluate your diabetes, blood pressure, shortness of breath - OR, please follow-up in the clinic sooner if needed.  There have not been changes in your medications.    Start taking your blood pressure medication daily without missing doses.  Start checking your blood sugars once daily before breakfast.  Work on cutting down on smoking and work towards quitting.  Read the information below regarding heart failure, how to quit smoking, reducing your salt intake.  DO NOT DRINK MORE THAN 1800 milliliters of fluid in a day (less than a 2 liter of soda)  If you are diabetic, please bring your meter to your next visit.  If symptoms worsen, or new symptoms arise, please call the clinic or go to the ER.  PLEASE BRING ALL OF YOUR MEDICATIONS  IN A BAG TO YOUR NEXT APPOINTMENT   Treatment Goals:  Goals (1 Years of Data) as of 11/11/2012          As of Today 10/30/11 10/27/11     Blood Pressure    . Blood Pressure < 140/90  153/90 97/62      Result Component    . HEMOGLOBIN A1C < 7.0  6.0  7.2    . LDL CALC < 100    94      LIFESTYLE TIPS TO HELP WITH YOUR BLOOD PRESSURE CONTROL  WEIGHT REDUCTION:  Strategies: A healthy weight loss program includes:  A calorie restricted diet based on individual calorie needs.   Increased physical activity (exercise).  An exercise program is just as important as the right low-calorie diet.    An unhealthy weight loss program includes:  Fasting.   Fad diets.   Supplements and drugs.  These choices do not succeed in long-term weight control.   Home Care Instructions: To help you make the needed dietary changes:   Exercise and perform physical activity as directed by your caregiver.   Keep a daily record of everything you eat. There are many free websites to help you with this. It may be helpful to measure your foods so you can determine if you  are eating the correct portion sizes.   Use low-calorie cookbooks or take special cooking classes.   Avoid alcohol. Drink more water and drinks with no calories.   Take vitamins and supplements only as recommended by your caregiver.   Weight loss support groups, Registered Dieticians, counselors, and stress reduction education can also be very helpful.   ________________________________________________________________________  DASH DIET:  The DASH diet stands for "Dietary Approaches to Stop Hypertension." It is a healthy eating plan that has been shown to reduce high blood pressure (hypertension) in as little as 14 days, while also possibly providing other significant health benefits. These other health benefits include reducing the risk of breast cancer after menopause and reducing the risk of type 2 diabetes, heart disease, colon cancer, and stroke. Health benefits also include weight loss and slowing kidney failure in patients with chronic kidney disease.   Diet guidelines: Limit salt (sodium). Your diet should contain less than 1500 mg of sodium daily.  Limit refined or processed carbohydrates. Your diet should include mostly whole grains. Desserts and added sugars should be used sparingly.  Include small amounts of heart-healthy fats. These types of fats include nuts, oils, and tub margarine. Limit saturated and trans fats. These fats have been shown to be harmful in the body.   Choosing  Foods: The following food groups are based on a 2000 calorie diet. See your Registered Dietitian for individual calorie needs.  Grains and Grain Products (6 to 8 servings daily)  Eat More Often: Whole-wheat bread, brown rice, whole-grain or wheat pasta, quinoa, popcorn without added fat or salt (air popped).  Eat Less Often: White bread, white pasta, white rice, cornbread.  Vegetables (4 to 5 servings daily)  Eat More Often: Fresh, frozen, and canned vegetables. Vegetables may be raw, steamed,  roasted, or grilled with a minimal amount of fat.  Eat Less Often/Avoid: Creamed or fried vegetables. Vegetables in a cheese sauce.  Fruit (4 to 5 servings daily)  Eat More Often: All fresh, canned (in natural juice), or frozen fruits. Dried fruits without added sugar. One hundred percent fruit juice ( cup [237 mL] daily).  Eat Less Often: Dried fruits with added sugar. Canned fruit in light or heavy syrup.  [employer], Fish, and Poultry (2 servings or less daily. One serving is 3 to 4 oz [85-114 g]).  Eat More Often: Ninety percent or leaner ground beef, tenderloin, sirloin. Round cuts of beef, chicken breast, [place] breast. All fish. Grill, bake, or broil your meat. Nothing should be fried.  Eat Less Often/Avoid: Fatty cuts of meat, [place], or chicken leg, thigh, or wing. Fried cuts of meat or fish.  Dairy (2 to 3 servings)  Eat More Often: Low-fat or fat-free milk, low-fat plain or light yogurt, reduced-fat or part-skim cheese.  Eat Less Often/Avoid: Milk (whole, 2%, skim, or chocolate). Whole milk yogurt. Full-fat cheeses.  Nuts, Seeds, and Legumes (4 to 5 servings per week)  Eat More Often: All without added salt.  Eat Less Often/Avoid: Salted nuts and seeds, canned beans with added salt.  Fats and Sweets (limited)  Eat More Often: Vegetable oils, tub margarines without trans fats, sugar-free gelatin. Mayonnaise and salad dressings.  Eat Less Often/Avoid: Coconut oils, palm oils, butter, stick margarine, cream, half and half, cookies, candy, pie.   ________________________________________________________________________  Smoking Cessation Tips 1-800-QUIT-NOW  This document explains the best ways for you to quit smoking and new treatments to help. It lists new medicines that can double or triple your chances of quitting and quitting for good. It also considers ways to avoid relapses and concerns you may have about quitting, including weight gain.   Nicotine: A Powerful Addiction If you  have tried to quit smoking, you know how hard it can be. It is hard because nicotine is a very addictive drug. For some people, it can be as addictive as heroin or cocaine. Usually, people make 2 or 3 tries, or more, before finally being able to quit. Each time you try to quit, you can learn about what helps and what hurts. Quitting takes hard work and a lot of effort, but you can quit smoking.   Quitting smoking is one of the most important things you will ever do You will live longer, feel better, and live better.  The impact on your body of quitting smoking is felt almost immediately:   Five keys to quitting: Studies have shown that these 5 steps will help you quit smoking and quit for good. You have the best chances of quitting if you use them together:   1. GET READY  Set a quit date.  Change your environment.  Get rid of ALL cigarettes, ashtrays, matches, and lighters in your home, car, and place of work.  Do not let people smoke in your home.  Review  your past attempts to quit. Think about what worked and what did not.  Once you quit, do not smoke. NOT EVEN A PUFF!   2. GET SUPPORT AND ENCOURAGEMENT  Tell your family, friends, and coworkers that you are going to quit and need their support. Ask them not to smoke around you.  Get individual, group, or telephone counseling and support.  Many smokers find one or more of the many self-help books available useful in helping them quit and stay off tobacco.   3. LEARN NEW SKILLS AND BEHAVIORS  Try to distract yourself from urges to smoke. Talk to someone, go for a walk, or occupy your time with a task.  When you first try to quit, change your routine. Take a different route to work. Drink tea instead of coffee. Eat breakfast in a different place.  Do something to reduce your stress. Take a hot bath, exercise, or read a book.  Plan something enjoyable to do every day. Reward yourself for not smoking.  Explore interactive web-based programs  that specialize in helping you quit.   4. GET MEDICINE AND USE IT CORRECTLY .  Medicines can help you stop smoking and decrease the urge to smoke. Combining medicine with the above behavioral methods and support can quadruple your chances of successfully quitting smoking.  Talk with your doctor about these options.  5. BE PREPARED FOR RELAPSE OR DIFFICULT SITUATIONS  Most relapses occur within the first 3 months after quitting. Do not be discouraged if you start smoking again. Remember, most people try several times before they finally quit.  You may have symptoms of withdrawal because your body is used to nicotine. You may crave cigarettes, be irritable, feel very hungry, cough often, get headaches, or have difficulty concentrating.  The withdrawal symptoms are only temporary. They are strongest when you first quit, but they will go away within 10 to 14 days.   Quitting takes hard work and a lot of effort, but you can quit smoking.   FOR MORE INFORMATION  Smokefree.gov (http://www.davis-sullivan.com/) provides free, accurate, evidence-based information and professional assistance to help support the immediate and long-term needs of people trying to quit smoking.  Document Released: 11/04/2001 Document Re-Released: 04/30/2010  [facility] Patient Information 2011 [place], [place].           Heart Failure Heart failure (HF) is a condition in which the heart has trouble pumping blood. This means your heart does not pump blood efficiently for your body to work well. In some cases of HF, fluid may back up into your lungs or you may have swelling (edema) in your lower legs. HF is a long-term (chronic) condition. It is important for you to take good care of yourself and follow your caregiver's treatment plan. CAUSES    Health conditions:   High blood pressure (hypertension) causes the heart muscle to work harder than normal. When pressure in the blood vessels is high, the heart needs to pump  (contract) with more force in order to circulate blood throughout the body. High blood pressure eventually causes the heart to become stiff and weak.   Coronary artery disease (CAD) is the buildup of cholesterol and fat (plaques) in the arteries of the heart. The blockage in the arteries deprives the heart muscle of oxygen and blood. This can cause chest pain and may lead to a heart attack. High blood pressure can also contribute to CAD.   Heart attack (myocardial infarction) occurs when 1 or more arteries in the heart  become blocked. The loss of oxygen damages the muscle tissue of the heart. When this happens, part of the heart muscle dies. The injured tissue does not contract as well and weakens the heart's ability to pump blood.   Abnormal heart valves can cause HF when the heart valves do not open and close properly. This makes the heart muscle pump harder to keep the blood flowing.   Heart muscle disease (cardiomyopathy or myocarditis) is damage to the heart muscle from a variety of causes. These can include drug or alcohol abuse, infections, or unknown reasons. These can increase the risk of HF.   Lung disease makes the heart work harder because the lungs do not work properly. This can cause a strain on the heart leading it to fail.   Diabetes increases the risk of HF. High blood sugar contributes to high fat (lipid) levels in the blood. Diabetes can also cause slow damage to tiny blood vessels that carry important nutrients to the heart muscle. When the heart does not get enough oxygen and food, it can cause the heart to become weak and stiff. This leads to a heart that does not contract efficiently.   Other diseases can contribute to HF. These include abnormal heart rhythms, thyroid problems, and low blood counts (anemia).   Unhealthy lifestyle habits:   Obesity.   Smoking.   Eating foods high in fat and cholesterol.   Eating or drinking beverages high in salt.   Drug or alcohol  abuse.   Lack of exercise.  SYMPTOMS   HF symptoms may vary and can be hard to detect. Symptoms may include:  Shortness of breath with activity, such as climbing stairs.   Persistent cough.   Swelling of the feet, ankles, legs, or abdomen.   Unexplained weight gain.   Difficulty breathing when lying flat.   Waking from sleep because of the need to sit up and get more air.   Rapid heartbeat.   Fatigue and loss of energy.   Feeling lightheaded or close to fainting.  DIAGNOSIS   A diagnosis of HF is based on your history, symptoms, physical examination, and diagnostic tests. Diagnostic tests for HF may include:  EKG.   Chest X-ray.   Blood tests.   Exercise stress test.   Blood oxygen test (arterial blood gas).   Evaluation by a heart doctor (cardiologist).   Ultrasound evaluation of the heart (echocardiogram).   Heart artery test to look for blockages (angiogram).   Radioactive imaging to look at the heart (radionuclide test).  TREATMENT   Treatment is aimed at managing the symptoms of HF. Medicines, lifestyle changes, or surgical intervention may be necessary to treat HF.  Medicines to help treat HF may include:   Angiotensin-converting enzyme (ACE) inhibitors. These block the effects of a blood protein called angiotensin-converting enzyme. ACE inhibitors relax (dilate) the blood vessels and help lower blood pressure. This decreases the workload of the heart, slows the progression of HF, and improves symptoms.   Angiotensin receptor blockers (ARBs). These medications work similar to ACE inhibitors. ARBs may be an alternative for people who cannot tolerate an ACE inhibitor.   Aldosterone antagonists. This medication helps get rid of extra fluid from your body. This lowers the volume of blood the heart has to pump.   Water pills (diuretics). Diuretics cause the kidneys to remove salt and water from the blood. The extra fluid is removed by urination. By removing  extra fluid from the body, diuretics help lower the  workload of the heart and help prevent fluid buildup in the lungs so breathing is easier.   Beta blockers. These prevent the heart from beating too fast and improve heart muscle strength. Beta blockers help maintain a normal heart rate, control blood pressure, and improve HF symptoms.   Digitalis. This increases the force of the heartbeat and may be helpful to people with HF or heart rhythm problems.   Healthy lifestyle changes include:   Stopping smoking.   Eating a healthy diet. Avoid foods high in fat. Avoid foods fried in oil or made with fat. A dietician can help with healthy food choices.   Limiting how much salt you eat.   Limiting alcohol intake to no more than 1 drink per day for women and 2 drinks per day for men. Drinking more than that is harmful to your heart. If your heart has already been damaged by alcohol or you have severe HF, drinking alcohol should be stopped completely.   Exercising as directed by your caregiver.   Surgical treatment for HF may include:   Procedures to open blocked arteries, repair damaged heart valves, or remove damaged heart muscle tissue.   A pacemaker to help heart muscle function and to control certain abnormal heart rhythms.   A defibrillator to possibly prevent sudden cardiac death.  HOME CARE INSTRUCTIONS    Activity level. Your caregiver can help you determine what type of exercise program may be helpful. It is important to maintain your strength. Pace your physical activity to avoid shortness of breath or chest pain. Rest for 1 hour before and after meals. A cardiac rehabilitation program may be helpful to some people with HF.   Diet. Eat a heart healthy diet. Food choices should be low in saturated fat and cholesterol. Talk to a dietician to learn about heart healthy foods.   Salt intake. When you have HF, you need to limit the amount of salt you eat. Eat less than 1500 milligrams (mg)  of salt per day or as recommended by your caregiver.   Weight monitoring. Weigh yourself every day. You should weigh yourself in the morning after you urinate and before you eat breakfast. Wear the same amount of clothing each time you weigh yourself. Record your weight daily. Bring your recorded weights to your clinic visits. Tell your caregiver right away if you have gained 3 lb/1.4 kg in 1 day, or 5 lb/2.3 kg in a week or whatever amount you were told to report.   Blood pressure monitoring. This should be done as directed by your caregiver. A home blood pressure cuff can be purchased at a drugstore. Record your blood pressure numbers and bring them to your clinic visits. Tell your caregiver if you become dizzy or lightheaded upon standing up.   Smoking. If you are currently a smoker, it is time to quit. Nicotine makes your heart work harder by causing your blood vessels to constrict. Do not use nicotine gum or patches before talking to your caregiver.   Follow up. Be sure to schedule a follow-up visit with your caregiver. Keep all your appointments.  SEEK MEDICAL CARE IF:    Your weight increases by 3 lb/1.4 kg in 1 day or 5 lb/2.3 kg in a week.   You notice increasing shortness of breath that is unusual for you. This may happen during rest, sleep, or with activity.   You cough more than normal, especially with physical activity.   You notice more swelling in your hands,  feet, ankles, or belly (abdomen).   You are unable to sleep because it is hard to breathe.   You cough up bloody mucus (sputum).   You begin to feel "jumping" or "fluttering" sensations (palpitations) in your chest.  SEEK IMMEDIATE MEDICAL CARE IF:    You have severe chest pain or pressure which may include symptoms such as:   Pain or pressure in the arms, neck, jaw, or back.   Feeling sweaty.   Feeling sick to your stomach (nauseous).   Feeling short of breath while at rest.   Having a fast or irregular  heartbeat.   You experience stroke symptoms. These symptoms include:   Facial weakness or numbness.   Weakness or numbness in an arm, leg, or on one side of your body.   Blurred vision.   Difficulty talking or thinking.   Dizziness or fainting.   Severe headache.  MAKE SURE YOU:    Understand these instructions.   Will watch your condition.   Will get help right away if you are not doing well or get worse.  Document Released: 11/10/2005 Document Revised: 05/11/2012 Document Reviewed: 02/22/2010 [facility] Patient Information 2013 [place], [place].   Sodium and Fluid Restriction Some health conditions may require you to restrict your sodium and fluid intake. Sodium is part of the salt in the blood. Sodium may be restricted because when you take in a lot of salt, you become thirsty. Limiting salt with help you become less thirsty and may make it easier to restrict fluid. Talk to your caregiver or dietician about how many cups of fluid and how many milligrams of sodium you are allowed each day. If your caregiver has restricted your sodium and fluids, usually the amount you can drink depends on several things, such as:  Your urine output.   How much fluid you are retaining.   Your blood pressure.  Every 2 cups (500 mL) of fluid retained in the body becomes an extra 1 pound (0.5 kg) of body weight. The following are examples of some fluids you will have to restrict:  Tea, coffee, soda, lemonade, milk, water, and juice.   Alcoholic beverages.   Cream.   Gravy.   Ice cubes.   Soup and broth.  The following are foods that become liquid at room temperature. These foods will count towards your fluid intake.  Ice cream and ice milk.   Frozen yogurt and sherbet.   Frozen ice pops.   Flavored gelatin.  YOU MAY BE TAKING IN TOO MUCH FLUID IF:  Your weight increases.   Your face, hands, legs, feet, and abdomen start to swell.   You have trouble breathing.  HOME CARE  INSTRUCTIONS If you follow a low sodium diet closely, you will eat approximately 1,500 mg of sodium a day.    Avoid salty foods. This increases your thirst and makes fluid control more difficult. Foods high in sodium include:   Most canned foods, including most meats.   Most processed foods, including most meats.   Cheese.   Dried pasta and rice mixes.   Snack foods (chips, popcorn, pretzels, cheese puffs, salted nuts).   Dips, sauces, and salad dressings.   Do not use salt in cooking or add salt to your meal. Adriana Simas with herbs and spices, but not those that have salt in the name. Ask your caregiver if it is okay to use salt substitutes.   Eat home-prepared meals. Use fresh ingredients. Avoid canned, frozen, or packaged meals.  Read food labels to see how much sodium is in the food. Know how much sodium you are allowed each day.   When eating out, ask for dressings and sauces on the side.   Weigh yourself every morning with an empty bladder before you eat or drink. If your weight is going up, you are retaining fluid.   Freeze fruit juice or water in an ice cube tray. Use this as part of your fluid allowance.   Brush your teeth often or rinse your mouth with mouthwash to help your dry mouth. Lemon wedges, hard sour candies, chewing gum, or breath spray may help to moisten your mouth too.   Add a slice of fresh lemon or lemon juice to water or ice. This helps satisfy your thirst.   Try frozen fruits between meals, such as grapes or strawberries.   Swallow your pills along with meals or soft foods. This helps you save your fluid for something you enjoy.   Use small cups and glasses and learn to sip fluids slowly.   Keep your home cooler. Keep the air in your home as humid as possible. Dry air increases thirst.   Avoid being out in the hot sun.  Each morning, fill a jug with the amount of water you are allowed for the day. You can use this water as a guideline for fluid  allowance. Each time you take in fluid, pour an equal amount of water out of the container. This helps you to see how much fluid you are taking in. It also helps plan your fluid intake for the rest of the day. CONVERSIONS TO HELP MEASURE FLUID INTAKE  1 cup equals 8 oz (240 mL).    cup equals 6 oz (180 mL).   cup equals 5  oz (160 mL).    cup equals 4 oz (120 mL).   cup equals 2  oz (80 mL).    cup equals 2 oz (60 mL).   2 tbs equals 1 oz (30 mL).  Document Released: 09/07/2007 Document Revised: 05/11/2012 Document Reviewed: 04/23/2012 [facility] Patient Information 2013 [place], [place].

## 2012-11-11 NOTE — Assessment & Plan Note (Signed)
Assessment: Patient is new to our clinic today to establish care. Unfortunately, he did not bring up his medication concerns (specifically cost of a few of his medications or the associated disease processes) to my attention during our hour long visit today. At the time of checkout, he expressed concern about his Wellbutrin expense and Cholcrys. However, he was informed that as this is an already extended visit to establish care, he will need to bring these to the attention of his PCP on his appt on 12/02/2011. He has his indomethacin.   Plan:      Labs today.  Med record release form signed to get records from his cardiologist [facility] cardiology) and his PCP ([facility])  These records will be forwarded to his new PCP, Dr. Burtis Junes when comes for follow-up.

## 2012-11-11 NOTE — Progress Notes (Signed)
Patient: Jeremiah Perkins   MRN: [id number]  DOB: 09/18/1961  PCP: Jeremiah Bame, MD   Subjective:    HPI: [name] is a 51 y.o. male with a PMHx of type 2 diabetes mellitus well controlled with peripheral neuropathy, HTN, gout on indomethacin TID, chronic diastolic heart failure, and depression who presented to clinic today to establish care with a new PCP for the following:  1) DM2, uncontrolled -  Lab Results  Component Value Date   HGBA1C 6.0 11/11/2012   Patient checking blood sugars only PRN when he feels it to be high. Currently taking metformin 1000mg  BID and Victoza. Patient misses doses unable to quantify because will be out when he misses. 0 hypoglycemic episodes since last visit. Denies assisted hypoglycemia or recently hospitalizations for either hyper or hypoglycemia. denies polyuria, polydipsia, nausea, vomiting, diarrhea.  does not request refills today - states that he has drug program coverage for his Victoza.  In regards to diabetic complications:  Microvascular complications: Confirms: peripheral neuropathy and impotence ; Denies nephropathy and retinopathy.  Macrovascular complications: Confirms: none ; Denies cardiovascular disease, cerebrovascular disease and peripheral vascular disease.   Important diabetic medications: Is patient on aspirin? Yes Is patient on a statin? Yes Is patient on an ACE-I/ ARB? Yes  2) HTN - Patient does not check blood pressure regularly at home. Currently taking Lisinopril 20 mg . Patient misses doses 1-2 x per week on average. admits to chronic unchanged CP and SOB that is followed by his cardiologist. Denies headaches, dizziness, lightheadedness.  does not request refills today.   3) Gout - patient is on indomethacin that he has just had refilled within the last 1 week, states that his gout is better controlled after restarting this medication. He has previously been on allopurinol, however, he was taken off and does not know  why. He did not mention to me during the office visit, but to front desk staff mentioned he is having trouble with affording cholcrys.    Review of Systems: Per HPI.   Current Outpatient Medications: Medication Sig  . albuterol (PROVENTIL HFA;VENTOLIN HFA) 108 (90 BASE) MCG/ACT inhaler Inhale 2 puffs into the lungs every 6 (six) hours as needed.  Marland Kitchen aspirin 81 MG tablet Take 81 mg by mouth daily.  . colchicine 0.6 MG tablet Take two pills at onset of gout. Then 1 pill in 1-2 hours then stop.  . Liraglutide (VICTOZA) 18 MG/3ML SOLN Inject 1.8 mg into the skin daily.  Marland Kitchen lisinopril (PRINIVIL,ZESTRIL) 20 MG tablet Take 20 mg by mouth daily.  Marland Kitchen LORazepam (ATIVAN) 0.5 MG tablet Take 0.5 mg by mouth every 8 (eight) hours.  . sildenafil (VIAGRA) 100 MG tablet Take 100 mg by mouth daily as needed.  . simvastatin (ZOCOR) 40 MG tablet Take 40 mg by mouth every evening.  Marland Kitchen buPROPion (WELLBUTRIN XL) 300 MG 24 hr tablet Take 1 tablet (300 mg total) by mouth daily.  . furosemide (LASIX) 40 MG tablet Take 1 tablet (40 mg total) by mouth daily.  . indomethacin (INDOCIN) 50 MG capsule Take 1 capsule (50 mg total) by mouth 3 (three) times daily with meals.    Allergies  Allergen Reactions  . Other     Eggs - nausea/vomiting (did ok with the flu shot, seems to be an issue with the type of preparation of the egg product)    Past Medical History  Diagnosis Date  . Diabetes mellitus type 2, controlled, with complications DX: 1991  previously on insulin in 2000 for 3-4 years (but was stopped because adamantly did not want to be on insulin)  . Hypertension   . Congestive heart failure     No echo data available. Myocardial perfusion 2005 with EF 61%, presumed diastolic HF   . Diverticulosis     with history of diverticulitis in 10/2011  . Gout   . Arthritis     involving knees, ankles, back, elbows  . Major depression   . Diabetic peripheral neuropathy   . Anxiety   . Psychosexual dysfunction with  inhibited sexual excitement   . Hyperlipidemia   . OSA (obstructive sleep apnea) 1998    previously on CPAP - unable to tolerate    Past Surgical History  Procedure Date  . Reconstructive to ankle      right - 2/2 trauma sustained after fall  . 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 Cyst removal leg     back of left leg    Family History  Problem Relation Age of Onset  . Diabetes Mother   . CAD Mother 81    requiring quadruple bypass  . Hypertension Mother   . Stomach cancer Father   . Lung cancer Father     was a smoker  . Gout Father   . CAD Brother 48    requiring quadruple bypass  . Seizures Brother   . Hypertension Brother   . Diabetes Maternal Grandmother   . Alzheimer's disease Maternal Grandmother   . Alzheimer's disease Mother   . Breast cancer Paternal Aunt   . Diabetes Maternal Aunt     History   Social History  . Marital Status: Married    Spouse Name: N/A    Number of Children: 3  . Years of Education: AA   Occupational History  . Now unemployed     Barrister's clerk at [employer]   Social History Main Topics  . Smoking status: Current Every Day Smoker -- 0.1 packs/day for 10 years    Types: Cigarettes  . Smokeless tobacco: Never Used     Comment: Smokes 3 cigs per week / previously smoked 1ppd x 1-2 years  . Alcohol Use: Yes     Comment: 3-4 beers/liquor per week  . Drug Use: No  . Sexually Active: Not on file   Other Topics Concern  . 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 They each have children but none between the two of them.    Objective:    Physical Exam: Filed Vitals:   11/11/12 0828  BP: 153/90  Pulse: 78  Temp: 96.9 F (36.1 C)     General: Vital signs reviewed and noted. Well-developed, well-nourished, in no acute distress; alert, appropriate and cooperative throughout examination.  Head: Normocephalic, atraumatic.  Eyes: conjunctivae/corneas  clear. PERRL, EOM's intact.  Ears: TM nonerythematous, not bulging, good light reflex bilaterally.  Nose: Mucous membranes moist, not inflammed, nonerythematous.  Throat: Oropharynx nonerythematous, no exudate appreciated.   Neck: No deformities, masses, or tenderness noted.  Lungs:  Normal respiratory effort. Clear to auscultation BL without crackles or wheezes.  Heart: RRR. S1 and S2 normal without gallop, rubs. (+) murmur.  Abdomen:  BS normoactive. Soft, Nondistended, non-tender.  No masses or organomegaly.  Extremities: No pretibial edema. See foot exam form for additional details.  Neurologic: A&O X3, CN II - XII are grossly intact. Motor strength  is 5/5 in the all 4 extremities, Sensations intact to light touch, Cerebellar signs negative.      Assessment/ Plan:   Case and plan of care discussed with attending physician, Dr. Blanch Media.   Greater than 60 minutes spent with the patient with > 50% of time spent in direct face-to-face counseling.

## 2012-11-11 NOTE — Assessment & Plan Note (Signed)
Pertinent Data: BP Readings from Last 3 Encounters:  11/11/12 148/84  10/30/11 97/62    Basic Metabolic Panel:    Component Value Date/Time   NA 140 10/30/2011 0739   K 4.3 10/30/2011 0739   CL 104 10/30/2011 0739   CO2 27 10/30/2011 0739   BUN 19 10/30/2011 0739   CREATININE 0.90 10/30/2011 0739   GLUCOSE 146* 10/30/2011 0739   CALCIUM 8.8 10/30/2011 0739    Assessment: Disease Control: mildly elevated  Progress toward goals: unable to assess  Barriers to meeting goals: nonadherence to medications - he is missing his lisinopril 1-2 times a week at baseline.      Likely uncontrolled OSA and morbid obesity further contributes towards this issue.  Patient is noncompliant some of the time with prescribed medications.   Plan:  continue current medications  CMET  Smoking cessation recommended and counseling provided.  Educational resources provided: brochure  Self management tools provided:

## 2012-11-11 NOTE — Assessment & Plan Note (Signed)
Assessment: Patient states that he was diagnosed with sleep apnea in the 1990s, and was intolerant of CPAP at that time. States that that was the last sleep study he has had performed. He was educated about the importance of appropriate treatment of sleep apnea particularly given the pulmonary hypertension and left atrial dilatation noted on his recent right heart cath.  Plan:      Will need to get split-night sleep study in the future - likely after January, as this is when he will qualify for orange card.   Advised for weight loss.

## 2012-11-12 LAB — MICROALBUMIN / CREATININE URINE RATIO
Creatinine, Urine: 437.1 mg/dL
Microalb Creat Ratio: 4.6 mg/g (ref 0.0–30.0)
Microalb, Ur: 1.99 mg/dL — ABNORMAL HIGH (ref 0.00–1.89)

## 2012-11-12 NOTE — Progress Notes (Signed)
Quick Note:  Uric acid is not at goal, should be < 6. As we did not discuss his gout in specific detail during his establishing care visit, this will need to be re-explored with consideration to start allopurinol (after exploring through records from PCP when they arrive, about why he was discontinued of this medication). After he gets orange card in Jan, will consider to change from an his ACE-I (Lisinopril) to Losartan, which may reduce contributing causes towards his gout flares, and actually help with its uricosuric effects. ______

## 2012-11-12 NOTE — Progress Notes (Signed)
Quick Note:  LDL at goal of < 100 mg/dL. ______

## 2012-11-15 ENCOUNTER — Encounter: Payer: Self-pay | Admitting: Internal Medicine

## 2012-11-15 NOTE — Progress Notes (Signed)
Per the patient's request, I contacted his [employer], connection to care program to try to facilitate for him to be able to get his Viagra, which she gets for free through the program. I spoke with the [employer] representative who indicated that because air distribution center, they cannot deliver the medications his home. Instead, they can only deliver it to be prescribers clinic address. It is our clinic policy that we do not except medication delivery to our clinic. Therefore, we will be unable to facilitate this transaction.  The patient was called and informed of this, and is angry that we cannot accept his medications. I explained that this is clinic policy that I am not able to change myself. I called the health department pharmacy and am unable to get a hold of a live person with whom to speak about this. I recommended that he contact the health Department and see if he can get the medication there or through the medication assistance program.  I will forward to his PCP for consideration to continue efforts at her follow-up visit with [name].  Signed: Johnette Abraham, Roma Schanz, Internal Medicine Resident 11/15/2012, 2:31 PM

## 2012-11-26 ENCOUNTER — Ambulatory Visit: Payer: Self-pay

## 2012-11-26 ENCOUNTER — Ambulatory Visit (INDEPENDENT_AMBULATORY_CARE_PROVIDER_SITE_OTHER): Payer: No Typology Code available for payment source | Admitting: Internal Medicine

## 2012-11-26 ENCOUNTER — Encounter: Payer: Self-pay | Admitting: Internal Medicine

## 2012-11-26 DIAGNOSIS — J069 Acute upper respiratory infection, unspecified: Secondary | ICD-10-CM | POA: Insufficient documentation

## 2012-11-26 DIAGNOSIS — R05 Cough: Secondary | ICD-10-CM

## 2012-11-26 DIAGNOSIS — L0292 Furuncle, unspecified: Secondary | ICD-10-CM | POA: Insufficient documentation

## 2012-11-26 MED ORDER — ACETAMINOPHEN 325 MG PO TABS: 650.0000 mg | ORAL_TABLET | Freq: Four times a day (QID) | ORAL | Status: DC | PRN

## 2012-11-26 MED ORDER — DOXYCYCLINE HYCLATE 100 MG PO TABS: 100.0000 mg | ORAL_TABLET | Freq: Two times a day (BID) | ORAL | Status: DC

## 2012-11-26 MED ORDER — GUAIFENESIN-CODEINE 100-10 MG/5ML PO SYRP: 5.0000 mL | ORAL_SOLUTION | Freq: Three times a day (TID) | ORAL | Status: DC | PRN

## 2012-11-26 NOTE — Assessment & Plan Note (Signed)
Patient's symptoms are most likely caused by upper respiratory viral infection. Although patient has pruritic chest pain, there is no fever or chills currently. Lung auscultation is clear bilaterally. It is unlikely that patient has pneumonia. Patient also reports having worsening shortness of breath and occasional palpitation. It is likely caused by upper respiratory infection rather than congestive heart failure exacerbation given no worsening of leg edema and auscultation. In addition, he is to take Lasix 40 mg daily. He didn't miss any dose.  -Will treat patient symptomatically with Tylenol for chest pain and cough medication. -Patient is instructed to come back to the hospital if his symptoms get worse.

## 2012-11-26 NOTE — Assessment & Plan Note (Addendum)
Patient has 3 small boils over his left groin area. There are some pus coming out per patient, but I could not express any pus from these boils. Currently patient does not have fever or chills. No signs of sepsis. Given his history of diabetes, will treat with doxycycline for 10 days.

## 2012-11-26 NOTE — Progress Notes (Signed)
Patient ID: Jeremiah Perkins, male   DOB: 1961/03/12, 52 y.o.   MRN: [id number]  Subjective:   Patient ID: Jeremiah Perkins male   DOB: October 31, 1961 52 y.o.   MRN: [id number]  HPI: [name] is a 52 y.o. with past medical history as outlined below, who presents for an acute visit today.  Patient reports that she started having coughing, runny nose, sore throat, headache and mild fever since 11/15/12. He also reports having productive cough with yellow-colored teaspoonful sputum. He has chest pain, which is located at mid of front chest. It is 4/10 in severity, sharp and non-radiating. His chest pain is aggravated by coughing, not by exertion. He also reports that he has palpitation occasionally and chronic shortness of breath which is slightly worse than baseline. He reports that he has congestive heart failure. He's currently taking lisinopril 20 mg daily and Lasix 40 mg daily. He did not notice body weight gain and worsening of leg edema. He said that he had a pneumonia in last winter with a similar symptoms. He wants to know whether he has pneumonia now.  Patient also reports having few boils in the left groin area. Sometimes he noticed pus coming out from these boils. There is mild pain locally. He denies having fever or chills currently.  Past Medical History  Diagnosis Date  . Diabetes mellitus type 2, controlled, with complications DX: 1991    previously on insulin in 2000 for 3-4 years (but was stopped because adamantly did not want to be on insulin)  . Hypertension   . Congestive heart failure     No echo data available. Myocardial perfusion 2005 with EF 61%, presumed diastolic HF   . Diverticulosis     with history of diverticulitis in 10/2011  . Gout   . Arthritis     involving knees, ankles, back, elbows  . Major depression   . Diabetic peripheral neuropathy   . Anxiety   . Psychosexual dysfunction with inhibited sexual excitement   . Hyperlipidemia   . OSA  (obstructive sleep apnea) 1998    previously on CPAP - unable to tolerate   Current Outpatient Prescriptions  Medication Sig Dispense Refill  . acetaminophen (TYLENOL) 325 MG tablet Take 2 tablets (650 mg total) by mouth every 6 (six) hours as needed for pain.  50 tablet  0  . albuterol (PROVENTIL HFA;VENTOLIN HFA) 108 (90 BASE) MCG/ACT inhaler Inhale 2 puffs into the lungs every 6 (six) hours as needed.      Marland Kitchen aspirin 81 MG tablet Take 81 mg by mouth daily.      Marland Kitchen buPROPion (WELLBUTRIN XL) 300 MG 24 hr tablet Take 1 tablet (300 mg total) by mouth daily.  30 tablet  0  . colchicine 0.6 MG tablet Take two pills at onset of gout. Then 1 pill in 1-2 hours then stop.  10 tablet  0  . doxycycline (VIBRA-TABS) 100 MG tablet Take 1 tablet (100 mg total) by mouth 2 (two) times daily.  20 tablet  0  . furosemide (LASIX) 40 MG tablet Take 40 mg by mouth daily.      . indomethacin (INDOCIN) 50 MG capsule Take 1 capsule (50 mg total) by mouth 3 (three) times daily with meals.  90 capsule  0  . Liraglutide (VICTOZA) 18 MG/3ML SOLN Inject 1.8 mg into the skin daily.      Marland Kitchen lisinopril (PRINIVIL,ZESTRIL) 20 MG tablet Take 20 mg by mouth daily.      Marland Kitchen  LORazepam (ATIVAN) 0.5 MG tablet Take 0.5 mg by mouth every 8 (eight) hours.      . sildenafil (VIAGRA) 100 MG tablet Take 100 mg by mouth daily as needed.      . simvastatin (ZOCOR) 40 MG tablet Take 40 mg by mouth every evening.      . furosemide (LASIX) 40 MG tablet Take 1 tablet (40 mg total) by mouth daily.  30 tablet  0  . guaiFENesin-codeine (ROBITUSSIN AC) 100-10 MG/5ML syrup Take 5 mLs by mouth 3 (three) times daily as needed for cough.  120 mL  1   Family History  Problem Relation Age of Onset  . Diabetes Mother   . CAD Mother 40    requiring quadruple bypass  . Hypertension Mother   . Stomach cancer Father   . Lung cancer Father     was a smoker  . Gout Father   . CAD Brother 48    requiring quadruple bypass  . Seizures Brother   .  Hypertension Brother   . Diabetes Maternal Grandmother   . Alzheimer's disease Maternal Grandmother   . Alzheimer's disease Mother   . Breast cancer Paternal Aunt   . Diabetes Maternal Aunt    History   Social History  . Marital Status: Married    Spouse Name: N/A    Number of Children: 3  . Years of Education: AA   Occupational History  . Now unemployed     Barrister's clerk at [employer]   Social History Main Topics  . Smoking status: Current Every Day Smoker -- 0.1 packs/day for 10 years    Types: Cigarettes  . Smokeless tobacco: Never Used     Comment: Smokes 3 cigs per week / previously smoked 1ppd x 1-2 years  . Alcohol Use: Yes     Comment: 3-4 beers/liquor per week  . Drug Use: No  . Sexually Active: None   Other Topics Concern  . 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 They each have children but none between the two of them.   Review of Systems:  General: no fevers, chills, no changes in body weight, no changes in appetite Skin: no rash, but has boils in left groin area HEENT: no blurry vision, hearing changes. Has sore throat Pulm: Has cough, mild SOB, No wheezing CV: haschest pain and occational palpitations Abd: no nausea/vomiting, abdominal pain, diarrhea/constipation GU: no dysuria, hematuria, polyuria Ext: has trace edema in legs. Neuro: no weakness, numbness, or tingling   Objective:  Physical Exam:  Vitals:  T: 97.9      HR: 82      BP:  154/97     RR:       O2 saturation:  99%  General: not in acute distress HEENT: PERRL, EOMI, no scleral icterus. Pink pharynx without tonsillar enlargement or exudation. There is no lymphadenopathy in neck. Cardiac: S1/S2, RRR, No murmurs, gallops or rubs Pulm: Good air movement bilaterally, Clear to auscultation bilaterally, No rales, wheezing, rhonchi or rubs. Abd: Soft,  nondistended, nontender, no rebound pain, no organomegaly, BS present Ext:  has trace leg edema bilaterally, 2+DP/PT pulse bilaterally Musculoskeletal: No joint deformities, erythema, or stiffness, ROM full and no nontender Skin: no rashes. No skin bruise. There 3 boils over left groin area. The largest one is 3 mm in size with induration which is about 2 cm in size. There is tenderness locally. There's no  pus or discharge expressed. Neuro: alert and oriented X3, cranial nerves II-XII grossly intact, muscle strength 5/5 in all extremeties,  sensation to light touch intact.  Psych.: patient is not psychotic, no suicidal or hemocidal ideation.     Assessment & Plan:

## 2012-11-26 NOTE — Patient Instructions (Signed)
1.Your symptoms are most likely caused by vial infection. Currently I don't think you have pneumonia. I will give you cough medication and Tylenol for your chest pain. If your symptoms get worse, please come back to the hospital.  2. Please take all medications as prescribed.  3. If you have worsening of your symptoms or new symptoms arise, please call the clinic ([phone]), or go to the ER immediately if symptoms are severe.  You have done great job in taking all your medications. I appreciate it very much. Please continue doing that.

## 2012-12-01 ENCOUNTER — Ambulatory Visit: Payer: Self-pay

## 2012-12-01 ENCOUNTER — Ambulatory Visit (INDEPENDENT_AMBULATORY_CARE_PROVIDER_SITE_OTHER): Payer: No Typology Code available for payment source | Admitting: Internal Medicine

## 2012-12-01 ENCOUNTER — Ambulatory Visit: Payer: Self-pay | Admitting: Internal Medicine

## 2012-12-01 ENCOUNTER — Encounter: Payer: Self-pay | Admitting: Internal Medicine

## 2012-12-01 VITALS — BP 146/80 | HR 71 | Temp 97.0°F | Ht 67.0 in | Wt 287.0 lb

## 2012-12-01 DIAGNOSIS — E119 Type 2 diabetes mellitus without complications: Secondary | ICD-10-CM

## 2012-12-01 DIAGNOSIS — M1990 Unspecified osteoarthritis, unspecified site: Secondary | ICD-10-CM

## 2012-12-01 DIAGNOSIS — G4733 Obstructive sleep apnea (adult) (pediatric): Secondary | ICD-10-CM

## 2012-12-01 DIAGNOSIS — F329 Major depressive disorder, single episode, unspecified: Secondary | ICD-10-CM

## 2012-12-01 MED ORDER — AGAMATRIX PRESTO PRO METER DEVI: 1.0000 | Freq: Once | Status: DC

## 2012-12-01 MED ORDER — OXYCODONE-ACETAMINOPHEN 5-325 MG PO TABS: 1.0000 | ORAL_TABLET | Freq: Four times a day (QID) | ORAL | Status: DC | PRN

## 2012-12-01 MED ORDER — GLUCOSE BLOOD VI STRP: ORAL_STRIP | Status: DC

## 2012-12-02 ENCOUNTER — Ambulatory Visit: Payer: Self-pay | Admitting: Internal Medicine

## 2012-12-03 NOTE — Progress Notes (Signed)
Subjective:   Patient ID: Jeremiah Perkins male   DOB: 27-Oct-1961 52 y.o.   MRN: [id number]  HPI: Jeremiah Perkins is a 52 y.o. man with pmh of diastolic CHF, DM, HTN OSA, and MDD presents to clinic for worsening depression. Pt is not actively suicidal at this time and throughout the interview. Pt has some suicidal passing thoughts but no contemplative plan. Pt recent stressor include unemployment and loss of "control." Wife now makes the money and pt unable to engage in social activities. Pt describes increasing anhedonia and social withdrawal. Pt is currently being seen by a therapist at [facility]. Pt is happy with prescription of wellbutrin and not interested in trying anything else at this time. Only barrier is finances at this time. Otherwise pt has not noticed fever/chills, n/v/d/constipation, and is fully recovering from previous cold on 11/26/12 with residual post viral cough.     Past Medical History  Diagnosis Date  . Diabetes mellitus type 2, controlled, with complications DX: 1991    previously on insulin in 2000 for 3-4 years (but was stopped because adamantly did not want to be on insulin)  . Hypertension   . Congestive heart failure     No echo data available. Myocardial perfusion 2005 with EF 61%, presumed diastolic HF   . Diverticulosis     with history of diverticulitis in 10/2011  . Gout   . Arthritis     involving knees, ankles, back, elbows  . Major depression   . Diabetic peripheral neuropathy   . Anxiety   . Psychosexual dysfunction with inhibited sexual excitement   . Hyperlipidemia   . OSA (obstructive sleep apnea) 1998    previously on CPAP - unable to tolerate   Current Outpatient Prescriptions  Medication Sig Dispense Refill  . acetaminophen (TYLENOL) 325 MG tablet Take 2 tablets (650 mg total) by mouth every 6 (six) hours as needed for pain.  50 tablet  0  . albuterol (PROVENTIL HFA;VENTOLIN HFA) 108 (90 BASE) MCG/ACT inhaler  Inhale 2 puffs into the lungs every 6 (six) hours as needed.      Marland Kitchen aspirin 81 MG tablet Take 81 mg by mouth daily.      . Blood Glucose Monitoring Suppl (AGAMATRIX PRESTO PRO METER) DEVI 1 Device by Does not apply route once.  1 Device  0  . buPROPion (WELLBUTRIN XL) 300 MG 24 hr tablet Take 1 tablet (300 mg total) by mouth daily.  30 tablet  0  . colchicine 0.6 MG tablet Take two pills at onset of gout. Then 1 pill in 1-2 hours then stop.  10 tablet  0  . doxycycline (VIBRA-TABS) 100 MG tablet Take 1 tablet (100 mg total) by mouth 2 (two) times daily.  20 tablet  0  . furosemide (LASIX) 40 MG tablet Take 1 tablet (40 mg total) by mouth daily.  30 tablet  0  . furosemide (LASIX) 40 MG tablet Take 40 mg by mouth daily.      Marland Kitchen glucose blood (AGAMATRIX PRESTO TEST) test strip Use as instructed  100 each  12  . guaiFENesin-codeine (ROBITUSSIN AC) 100-10 MG/5ML syrup Take 5 mLs by mouth 3 (three) times daily as needed for cough.  120 mL  1  . indomethacin (INDOCIN) 50 MG capsule Take 1 capsule (50 mg total) by mouth 3 (three) times daily with meals.  90 capsule  0  . Liraglutide (VICTOZA) 18 MG/3ML SOLN Inject 1.8 mg into the skin  daily.      . lisinopril (PRINIVIL,ZESTRIL) 20 MG tablet Take 20 mg by mouth daily.      Marland Kitchen LORazepam (ATIVAN) 0.5 MG tablet Take 0.5 mg by mouth every 8 (eight) hours.      Marland Kitchen oxyCODONE-acetaminophen (ROXICET) 5-325 MG per tablet Take 1 tablet by mouth every 6 (six) hours as needed for pain.  30 tablet  0  . sildenafil (VIAGRA) 100 MG tablet Take 100 mg by mouth daily as needed.      . simvastatin (ZOCOR) 40 MG tablet Take 40 mg by mouth every evening.       Family History  Problem Relation Age of Onset  . Diabetes Mother   . CAD Mother 58    requiring quadruple bypass  . Hypertension Mother   . Stomach cancer Father   . Lung cancer Father     was a smoker  . Gout Father   . CAD Brother 48    requiring quadruple bypass  . Seizures Brother   . Hypertension Brother    . Diabetes Maternal Grandmother   . Alzheimer's disease Maternal Grandmother   . Alzheimer's disease Mother   . Breast cancer Paternal Aunt   . Diabetes Maternal Aunt    History   Social History  . Marital Status: Married    Spouse Name: N/A    Number of Children: 3  . Years of Education: AA   Occupational History  . Now unemployed     Barrister's clerk at [employer]   Social History Main Topics  . Smoking status: Current Every Day Smoker -- 0.1 packs/day for 10 years    Types: Cigarettes  . Smokeless tobacco: Never Used     Comment: Smokes 3 cigs per week / previously smoked 1ppd x 1-2 years  . Alcohol Use: Yes     Comment: 3-4 beers/liquor per week  . Drug Use: No  . Sexually Active: None   Other Topics Concern  . 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 They each have children but none between the two of them.   Review of Systems: otherwise negative unless listed in hpi  Objective:  Physical Exam: Filed Vitals:   12/01/12 0912  BP: 146/80  Pulse: 71  Temp: 97 F (36.1 C)  TempSrc: Oral  Height: 5\' 7"  (1.702 m)  Weight: 287 lb (130.182 kg)  SpO2: 97%   General: NAD, depressed mood but engaging in conversation Cardiac: RRR, no rubs, murmurs or gallops Pulm: clear to auscultation bilaterally, moving normal volumes of air Abd: soft, nontender, nondistended, BS present Ext: warm and well perfused, no pedal edema Neuro: alert and oriented X3, cranial nerves II-XII grossly intact  Assessment & Plan:  1. Depression: Pt has described increasing social isolation and withdrawal with some fleeting suicidal thoughts but no suicidal plan or homicidal intentions at this time. Pt recently diagnosed with MDD and on Wellbutrin which was providing symptomatic management. Pt recent stressor of unemployment and inability to get medication. Pt being seen by a therapist and organizing an action plan for medication with  [facility].   -[facility] -wrote script and pt provided coupon codes for wellbutrin -pt refused other medication options as this time -contract signed and agreed with patient if becomes suicidal to call crisis line or present to ED -should test testosterone levels at next visit  2. Other chronic issues: pt has  DM, HTN, OSA and will require a future visit to address all these concerns. Pt BP relatively well today 140s/80s.  -f/u in 1 month  -sleep study referral   Pt care discussed with Dr. Dalphine Handing

## 2012-12-29 NOTE — Progress Notes (Signed)
Patient ID: Jeremiah Perkins, male   DOB: 08-25-1961, 52 y.o.   MRN: [id number] Sleep study order placed.

## 2012-12-31 ENCOUNTER — Other Ambulatory Visit: Payer: Self-pay

## 2012-12-31 ENCOUNTER — Emergency Department (HOSPITAL_COMMUNITY): Payer: No Typology Code available for payment source

## 2012-12-31 ENCOUNTER — Encounter (HOSPITAL_COMMUNITY): Payer: Self-pay | Admitting: Emergency Medicine

## 2012-12-31 ENCOUNTER — Emergency Department (HOSPITAL_COMMUNITY)
Admission: EM | Admit: 2012-12-31 | Discharge: 2012-12-31 | Disposition: A | Discharge status: Home / Self Care | Payer: No Typology Code available for payment source | Attending: Emergency Medicine | Admitting: Emergency Medicine

## 2012-12-31 ENCOUNTER — Encounter: Payer: Self-pay | Admitting: Internal Medicine

## 2012-12-31 ENCOUNTER — Ambulatory Visit (INDEPENDENT_AMBULATORY_CARE_PROVIDER_SITE_OTHER): Payer: No Typology Code available for payment source | Admitting: Internal Medicine

## 2012-12-31 VITALS — BP 124/73 | HR 76 | Temp 98.1°F | Ht 67.0 in | Wt 296.7 lb

## 2012-12-31 DIAGNOSIS — I10 Essential (primary) hypertension: Secondary | ICD-10-CM | POA: Insufficient documentation

## 2012-12-31 DIAGNOSIS — E119 Type 2 diabetes mellitus without complications: Secondary | ICD-10-CM

## 2012-12-31 DIAGNOSIS — I509 Heart failure, unspecified: Secondary | ICD-10-CM | POA: Insufficient documentation

## 2012-12-31 DIAGNOSIS — F17200 Nicotine dependence, unspecified, uncomplicated: Secondary | ICD-10-CM | POA: Insufficient documentation

## 2012-12-31 DIAGNOSIS — Z8679 Personal history of other diseases of the circulatory system: Secondary | ICD-10-CM | POA: Insufficient documentation

## 2012-12-31 DIAGNOSIS — M109 Gout, unspecified: Secondary | ICD-10-CM | POA: Insufficient documentation

## 2012-12-31 DIAGNOSIS — E785 Hyperlipidemia, unspecified: Secondary | ICD-10-CM | POA: Insufficient documentation

## 2012-12-31 DIAGNOSIS — Z7982 Long term (current) use of aspirin: Secondary | ICD-10-CM | POA: Insufficient documentation

## 2012-12-31 DIAGNOSIS — Z8739 Personal history of other diseases of the musculoskeletal system and connective tissue: Secondary | ICD-10-CM | POA: Insufficient documentation

## 2012-12-31 DIAGNOSIS — Z8659 Personal history of other mental and behavioral disorders: Secondary | ICD-10-CM | POA: Insufficient documentation

## 2012-12-31 DIAGNOSIS — F411 Generalized anxiety disorder: Secondary | ICD-10-CM | POA: Insufficient documentation

## 2012-12-31 DIAGNOSIS — Z8719 Personal history of other diseases of the digestive system: Secondary | ICD-10-CM | POA: Insufficient documentation

## 2012-12-31 DIAGNOSIS — G909 Disorder of the autonomic nervous system, unspecified: Secondary | ICD-10-CM | POA: Insufficient documentation

## 2012-12-31 DIAGNOSIS — Z79899 Other long term (current) drug therapy: Secondary | ICD-10-CM | POA: Insufficient documentation

## 2012-12-31 DIAGNOSIS — E1149 Type 2 diabetes mellitus with other diabetic neurological complication: Secondary | ICD-10-CM | POA: Insufficient documentation

## 2012-12-31 LAB — PROTIME-INR: Prothrombin Time: 14 seconds (ref 11.6–15.2)

## 2012-12-31 LAB — POCT I-STAT TROPONIN I: Troponin i, poc: 0.03 ng/mL (ref 0.00–0.08)

## 2012-12-31 LAB — CBC WITH DIFFERENTIAL/PLATELET
Basophils Absolute: 0 10*3/uL (ref 0.0–0.1)
Basophils Relative: 1 % (ref 0–1)
Eosinophils Relative: 2 % (ref 0–5)
HCT: 39.4 % (ref 39.0–52.0)
MCHC: 33.8 g/dL (ref 30.0–36.0)
MCV: 85.8 fL (ref 78.0–100.0)
Monocytes Absolute: 0.4 10*3/uL (ref 0.1–1.0)
Platelets: 315 10*3/uL (ref 150–400)
RDW: 14.6 % (ref 11.5–15.5)
WBC: 7.9 10*3/uL (ref 4.0–10.5)

## 2012-12-31 LAB — POCT I-STAT, CHEM 8
Calcium, Ion: 1.1 mmol/L — ABNORMAL LOW (ref 1.12–1.23)
Chloride: 106 mEq/L (ref 96–112)
Glucose, Bld: 108 mg/dL — ABNORMAL HIGH (ref 70–99)
HCT: 40 % (ref 39.0–52.0)
Hemoglobin: 13.6 g/dL (ref 13.0–17.0)

## 2012-12-31 LAB — GLUCOSE, CAPILLARY: Glucose-Capillary: 115 mg/dL — ABNORMAL HIGH (ref 70–99)

## 2012-12-31 LAB — PRO B NATRIURETIC PEPTIDE: Pro B Natriuretic peptide (BNP): 1147 pg/mL — ABNORMAL HIGH (ref 0–125)

## 2012-12-31 MED ORDER — FUROSEMIDE 10 MG/ML IJ SOLN
40.0000 mg | Freq: Once | INTRAMUSCULAR | Status: AC
  Administered 2012-12-31: 40 mg via INTRAVENOUS
  Filled 2012-12-31: qty 4

## 2012-12-31 MED ORDER — NITROGLYCERIN 2 % TD OINT
0.5000 [in_us] | TOPICAL_OINTMENT | Freq: Once | TRANSDERMAL | Status: AC
  Administered 2012-12-31: 0.5 [in_us] via TOPICAL
  Filled 2012-12-31: qty 1

## 2012-12-31 NOTE — Progress Notes (Addendum)
Subjective: Pt with diabetes type 2, and hypertension presented to [facility] for evaluation of shortness of breath on exertion and 10 pound weight gain over the last week.  Patient endorsed chest pain on exam in the clinic was subsequently transferred to ED for workup of acute exacerbation of CHF and chest pain.   Objective: Vital signs in last 24 hours: Filed Vitals:   12/31/12 2015 12/31/12 2030 12/31/12 2045 12/31/12 2122  BP: 116/73 101/54 123/80 118/54  Pulse: 70 71 68 71  Temp:      TempSrc:      Resp: 23 24 21 24   SpO2: 97% 95% 96% 97%   Weight change:   Intake/Output Summary (Last 24 hours) at 12/31/12 2151 Last data filed at 12/31/12 2122  Gross per 24 hour  Intake      0 ml  Output   2500 ml  Net  -2500 ml   Physical Exam: General: morbidly obese AA male, lying on stretcher, in no acute distress; HEENT: Normocephalic, atraumatic, PERRLA, EOMI, No signs of anemia or jaundice, Moist mucous membranes, Oropharynx clear, upper dentures in place Neck: supple, no masses, no carotid Bruits, no JVD appreciated. Lungs: Normal respiratory effort. Fine crackles at bilateral bases, good movement of air throughout lung fields Heart: normal rate, regular rhythm, normal S1 and S2, no gallop, murmur, or rubs appreciated. Abdomen: Obese, BS normoactive. Soft, non-tender Extremities: +1 pitting edema bilateral, distal pulses intact Neurologic: grossly non-focal, alert and oriented x3, appropriate and cooperative throughout examination.    Lab Results: Basic Metabolic Panel:  Lab 12/31/12 1610  NA 141  K 4.4  CL 106  CO2 --  GLUCOSE 108*  BUN 24*  CREATININE 1.40*  CALCIUM --  MG --  PHOS --   CBC:  Lab 12/31/12 1539 12/31/12 1458  WBC -- 7.9  NEUTROABS -- 5.1  HGB 13.6 13.3  HCT 40.0 39.4  MCV -- 85.8  PLT -- 315   Cardiac Enzymes:  Lab 12/31/12 1547  CKTOTAL --  CKMB --  CKMBINDEX --  TROPONINI <0.30   BNP:  Lab 12/31/12 1547  PROBNP 1147.0*   CBG:  Lab  12/31/12 1415  GLUCAP 115*   Coagulation:  Lab 12/31/12 1615  LABPROT 14.0  INR 1.09   Studies/Results: Dg Chest 2 View  12/31/2012  *RADIOLOGY REPORT*  Clinical Data: Chest pain, shortness of breath.  CHEST - 2 VIEW  Comparison: October 27, 2011.  Findings: Stable cardiomegaly. No acute pulmonary disease is noted. Bony thorax is intact.  IMPRESSION: No acute cardiopulmonary abnormality seen.   Original Report Authenticated By: Lupita Raider.,  M.D.    Assessment/Plan:  #1 acute exacerbation of congestive heart failure:  Point of care troponin was negative,   proBNP was elevated at 1147 which is increased from a month ago at which time it was 720. Patient was without leukocytosis. Chest x-ray was without acute cardiopulmonary abnormalities and he was oxygenating 97% on 2 L nasal cannula. The patient received Lasix 40 mg IV x1 with prompt diuresis response of ~ 2 L output by the time he was evaluated by internal medicine team.  Patient declined admission for further observation and management. He was agreeable to staying for an additional measurement of his  troponin level in addition to compliance with outpatient Lasix therapy of 40 mg twice a day over the weekend and to call [facility] Monday morning for repeat evaluation of shortness of breath and kidney function. -repeat troponin at 6 hour mark post POC  troponin -increase Lasix 40 mg po bid -repeat BMET on clinic f/u -message sent to [facility] front desk for quick clinic f/u   Recent Labs Lab 12/31/12 1547  PROBNP 1147.0*     Recent Labs Lab 12/31/12 1547  TROPONINI <0.30

## 2012-12-31 NOTE — Progress Notes (Signed)
Subjective:   Patient ID: Jeremiah Perkins male   DOB: 09-06-1961 52 y.o.   MRN: [id number]  HPI: Jeremiah Perkins is a 51 y.o. man with a past medical history of diabetes type 2, CHF, hypertension, gout, OSA, and depression who presents for followup. Patient was having active chest pain that he describes as pressure-like having dyspnea on exertion shortness of breath and a 10 pound weight gain within the last week. Patient states that he has also developed an inability to lay flat at night causing severe shortness of breath and some decreased exercise tolerance causing chest pain with shorter distances. Patient was then immediately transferred to the ED for workup into CHF exacerbation or angina.    Past Medical History  Diagnosis Date  . Diabetes mellitus type 2, controlled, with complications DX: 1991    previously on insulin in 2000 for 3-4 years (but was stopped because adamantly did not want to be on insulin)  . Hypertension   . Congestive heart failure     No echo data available. Myocardial perfusion 2005 with EF 61%, presumed diastolic HF   . Diverticulosis     with history of diverticulitis in 10/2011  . Gout   . Arthritis     involving knees, ankles, back, elbows  . Major depression   . Diabetic peripheral neuropathy   . Anxiety   . Psychosexual dysfunction with inhibited sexual excitement   . Hyperlipidemia   . OSA (obstructive sleep apnea) 1998    previously on CPAP - unable to tolerate   Current Outpatient Prescriptions  Medication Sig Dispense Refill  . acetaminophen (TYLENOL) 325 MG tablet Take 2 tablets (650 mg total) by mouth every 6 (six) hours as needed for pain.  50 tablet  0  . albuterol (PROVENTIL HFA;VENTOLIN HFA) 108 (90 BASE) MCG/ACT inhaler Inhale 2 puffs into the lungs every 6 (six) hours as needed.      Marland Kitchen aspirin 81 MG tablet Take 81 mg by mouth daily.      . Blood Glucose Monitoring Suppl (AGAMATRIX PRESTO PRO METER) DEVI 1 Device by Does not apply  route once.  1 Device  0  . buPROPion (WELLBUTRIN XL) 300 MG 24 hr tablet Take 1 tablet (300 mg total) by mouth daily.  30 tablet  0  . colchicine 0.6 MG tablet Take two pills at onset of gout. Then 1 pill in 1-2 hours then stop.  10 tablet  0  . doxycycline (VIBRA-TABS) 100 MG tablet Take 1 tablet (100 mg total) by mouth 2 (two) times daily.  20 tablet  0  . furosemide (LASIX) 40 MG tablet Take 1 tablet (40 mg total) by mouth daily.  30 tablet  0  . furosemide (LASIX) 40 MG tablet Take 40 mg by mouth daily.      Marland Kitchen glucose blood (AGAMATRIX PRESTO TEST) test strip Use as instructed  100 each  12  . guaiFENesin-codeine (ROBITUSSIN AC) 100-10 MG/5ML syrup Take 5 mLs by mouth 3 (three) times daily as needed for cough.  120 mL  1  . indomethacin (INDOCIN) 50 MG capsule Take 1 capsule (50 mg total) by mouth 3 (three) times daily with meals.  90 capsule  0  . Liraglutide (VICTOZA) 18 MG/3ML SOLN Inject 1.8 mg into the skin daily.      Marland Kitchen lisinopril (PRINIVIL,ZESTRIL) 20 MG tablet Take 20 mg by mouth daily.      Marland Kitchen LORazepam (ATIVAN) 0.5 MG tablet Take 0.5 mg by mouth every  8 (eight) hours.      Marland Kitchen oxyCODONE-acetaminophen (ROXICET) 5-325 MG per tablet Take 1 tablet by mouth every 6 (six) hours as needed for pain.  30 tablet  0  . sildenafil (VIAGRA) 100 MG tablet Take 100 mg by mouth daily as needed.      . simvastatin (ZOCOR) 40 MG tablet Take 40 mg by mouth every evening.       Family History  Problem Relation Age of Onset  . Diabetes Mother   . CAD Mother 70    requiring quadruple bypass  . Hypertension Mother   . Stomach cancer Father   . Lung cancer Father     was a smoker  . Gout Father   . CAD Brother 48    requiring quadruple bypass  . Seizures Brother   . Hypertension Brother   . Diabetes Maternal Grandmother   . Alzheimer's disease Maternal Grandmother   . Alzheimer's disease Mother   . Breast cancer Paternal Aunt   . Diabetes Maternal Aunt    History   Social History  . Marital  Status: Married    Spouse Name: N/A    Number of Children: 3  . Years of Education: AA   Occupational History  . Now unemployed     Barrister's clerk at [employer]   Social History Main Topics  . Smoking status: Current Every Day Smoker -- 0.1 packs/day for 10 years    Types: Cigarettes  . Smokeless tobacco: Never Used     Comment: Smokes 3 cigs per week / previously smoked 1ppd x 1-2 years  . Alcohol Use: Yes     Comment: 3-4 beers/liquor per week  . Drug Use: No  . Sexually Active: Not on file   Other Topics Concern  . 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 They each have children but none between the two of them.   Review of Systems: otherwise negative unless listed in HPI  Objective:  Physical Exam: Filed Vitals:   12/31/12 1401  BP: 124/73  Pulse: 76  Temp: 98.1 F (36.7 C)  Height: 5\' 7"  (1.702 m)  Weight: 296 lb 11.2 oz (134.582 kg)  SpO2: 97%   General: some distress HEENT: PERRL, EOMI, no scleral icterus Cardiac: distant heart sounds  RRR, no rubs, murmurs or gallops Pulm: wheezes, and decreased BS bilaterally at bases, moving normal volumes of air Abd: soft, nontender, nondistended, BS present Ext: warm and well perfused, 2+ pedal edema Neuro: alert and oriented X3, cranial nerves II-XII grossly intact  Assessment & Plan:   pt having active typical cp sent to ED for evaluation

## 2012-12-31 NOTE — ED Provider Notes (Signed)
History     CSN: [id number]  Arrival date & time 12/31/12  1434   First MD Initiated Contact with Patient 12/31/12 1536      Chief Complaint  Patient presents with  . Chest Pain    (Consider location/radiation/quality/duration/timing/severity/associated sxs/prior treatment) HPI  Patient presents with concerns of anterior chest discomfort, dyspnea.  Symptoms began approximately 3 days ago.  Since onset symptoms of been pursuing.  The chest discomfort is minimal him a slightly worse with exertion, sore, nonradiating, not pleuritic.  The patient states that prior to the onset of symptoms is that he was generally in his usual state of health.  Over this timeframe he has also gained weight, approximately 10 pounds.  He denies lightheadedness, nausea, vomiting, diarrhea, fever, chills, confusion, disorientation, visual changes. He had a scheduled appointment today with his primary care clinic.  He was sent here from there for further evaluation.   Past Medical History  Diagnosis Date  . Diabetes mellitus type 2, controlled, with complications DX: 1991    previously on insulin in 2000 for 3-4 years (but was stopped because adamantly did not want to be on insulin)  . Hypertension   . Congestive heart failure     No echo data available. Myocardial perfusion 2005 with EF 61%, presumed diastolic HF   . Diverticulosis     with history of diverticulitis in 10/2011  . Gout   . Arthritis     involving knees, ankles, back, elbows  . Major depression   . Diabetic peripheral neuropathy   . Anxiety   . Psychosexual dysfunction with inhibited sexual excitement   . Hyperlipidemia   . OSA (obstructive sleep apnea) 1998    previously on CPAP - unable to tolerate    Past Surgical History  Procedure Date  . Reconstructive to ankle      right - 2/2 trauma sustained after fall  . 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 Cyst removal leg     back of left leg    Family History  Problem Relation  Age of Onset  . Diabetes Mother   . CAD Mother 63    requiring quadruple bypass  . Hypertension Mother   . Stomach cancer Father   . Lung cancer Father     was a smoker  . Gout Father   . CAD Brother 48    requiring quadruple bypass  . Seizures Brother   . Hypertension Brother   . Diabetes Maternal Grandmother   . Alzheimer's disease Maternal Grandmother   . Alzheimer's disease Mother   . Breast cancer Paternal Aunt   . Diabetes Maternal Aunt     History  Substance Use Topics  . Smoking status: Current Some Day Smoker -- 0.1 packs/day for 10 years    Types: Cigarettes  . Smokeless tobacco: Never Used     Comment: Smokes 3 cigs per week / previously smoked 1ppd x 1-2 years  . Alcohol Use: Yes     Comment: 3-4 beers/liquor per week      Review of Systems  Constitutional:       Per HPI, otherwise negative  HENT:       Per HPI, otherwise negative  Eyes: Negative.   Respiratory:       Per HPI, otherwise negative  Cardiovascular:       Per HPI, otherwise negative  Gastrointestinal: Negative for vomiting.  Genitourinary: Negative.   Musculoskeletal:       Per HPI,  otherwise negative  Skin: Negative.   Neurological: Negative for syncope.    Allergies  Other  Home Medications   Current Outpatient Rx  Name  Route  Sig  Dispense  Refill  . ACETAMINOPHEN 325 MG PO TABS   Oral   Take 2 tablets (650 mg total) by mouth every 6 (six) hours as needed for pain.   50 tablet   0   . ALBUTEROL SULFATE HFA 108 (90 BASE) MCG/ACT IN AERS   Inhalation   Inhale 2 puffs into the lungs every 6 (six) hours as needed.         . ASPIRIN 81 MG PO TABS   Oral   Take 81 mg by mouth daily.         Marland Kitchen AGAMATRIX PRESTO PRO METER DEVI   Does not apply   1 Device by Does not apply route once.   1 Device   0   . BUPROPION HCL ER (XL) 300 MG PO TB24   Oral   Take 1 tablet (300 mg total) by mouth daily.   30 tablet   0   . COLCHICINE 0.6 MG PO TABS      Take two pills at  onset of gout. Then 1 pill in 1-2 hours then stop.   10 tablet   0   . DOXYCYCLINE HYCLATE 100 MG PO TABS   Oral   Take 1 tablet (100 mg total) by mouth 2 (two) times daily.   20 tablet   0   . FUROSEMIDE 40 MG PO TABS   Oral   Take 1 tablet (40 mg total) by mouth daily.   30 tablet   0   . FUROSEMIDE 40 MG PO TABS   Oral   Take 40 mg by mouth daily.         Marland Kitchen GLUCOSE BLOOD VI STRP      Use as instructed   100 each   12   . GUAIFENESIN-CODEINE 100-10 MG/5ML PO SYRP   Oral   Take 5 mLs by mouth 3 (three) times daily as needed for cough.   120 mL   1   . INDOMETHACIN 50 MG PO CAPS   Oral   Take 1 capsule (50 mg total) by mouth 3 (three) times daily with meals.   90 capsule   0   . LIRAGLUTIDE 18 MG/3ML [place] SOLN   Subcutaneous   Inject 1.8 mg into the skin daily.         Marland Kitchen LISINOPRIL 20 MG PO TABS   Oral   Take 20 mg by mouth daily.         Marland Kitchen LORAZEPAM 0.5 MG PO TABS   Oral   Take 0.5 mg by mouth every 8 (eight) hours.         . OXYCODONE-ACETAMINOPHEN 5-325 MG PO TABS   Oral   Take 1 tablet by mouth every 6 (six) hours as needed for pain.   30 tablet   0   . SILDENAFIL CITRATE 100 MG PO TABS   Oral   Take 100 mg by mouth daily as needed.         Marland Kitchen SIMVASTATIN 40 MG PO TABS   Oral   Take 40 mg by mouth every evening.           BP 138/82  Pulse 76  Temp 98.2 F (36.8 C) (Oral)  Resp 18  SpO2 98%  Physical Exam  Nursing note and vitals  reviewed. Constitutional: He is oriented to person, place, and time. He appears well-developed. No distress.       Obese male in no distress  HENT:  Head: Normocephalic and atraumatic.  Eyes: Conjunctivae normal and EOM are normal.  Cardiovascular: Normal rate and regular rhythm.   Pulmonary/Chest: Effort normal. No stridor. No respiratory distress.  Abdominal: He exhibits no distension.  Musculoskeletal:       Large habitus, with symmetric lower extremity edema  Neurological: He is alert and  oriented to person, place, and time.  Skin: Skin is warm and dry.  Psychiatric: He has a normal mood and affect.    ED Course  Procedures (including critical care time)  Labs Reviewed  POCT I-STAT, CHEM 8 - Abnormal; Notable for the following:    BUN 24 (*)     Creatinine, Ser 1.40 (*)     Glucose, Bld 108 (*)     Calcium, Ion 1.10 (*)     All other components within normal limits  CBC WITH DIFFERENTIAL  POCT I-STAT TROPONIN I  PROTIME-INR  TROPONIN I  PRO B NATRIURETIC PEPTIDE   No results found.   No diagnosis found.   Cardiac 70 sinus rhythm normal Pulse ox 99% nasal cannula abnormal    Date: 12/31/2012  Rate: 70  Rhythm: normal sinus rhythm  QRS Axis: normal  Intervals: normal  ST/T Wave abnormalities: non specific T wave changes  Conduction Disutrbances: none  Narrative Interpretation: borderline - low voltage   9:01 PM Patient has had 2 L plus of urine output.  Is improving.  After discussion with the patient's primary care team, the patient will have a second troponin drawn.  He is adamant against staying overnight.  MDM  This patient presents with mild chest pain, but increasing dyspnea over the past several days.  On exam the patient is edematous, in no distress.  Given his history, or suspicion for ACS versus heart failure exacerbation.  Patient's labs demonstrated elevated BNP, negative initial troponin.  Given the decreased renal function he was advised to stay overnight for further evaluation, management.  The patient adamantly refused this.  The patient did have repeat, which was also negative.  He was discharged in stable condition with close followup instructions  Gerhard Munch, MD 12/31/12 2230

## 2012-12-31 NOTE — ED Notes (Signed)
Pt c/o mid sternal CP with SOB x 3 days; pt brought up here from [facility]; pt sts worse with exertion

## 2013-01-03 ENCOUNTER — Ambulatory Visit (INDEPENDENT_AMBULATORY_CARE_PROVIDER_SITE_OTHER): Payer: No Typology Code available for payment source | Admitting: Internal Medicine

## 2013-01-03 ENCOUNTER — Encounter: Payer: Self-pay | Admitting: Internal Medicine

## 2013-01-03 VITALS — BP 125/80 | HR 72 | Temp 97.5°F | Ht 67.0 in | Wt 292.3 lb

## 2013-01-03 DIAGNOSIS — L0292 Furuncle, unspecified: Secondary | ICD-10-CM

## 2013-01-03 DIAGNOSIS — I509 Heart failure, unspecified: Secondary | ICD-10-CM

## 2013-01-03 DIAGNOSIS — E118 Type 2 diabetes mellitus with unspecified complications: Secondary | ICD-10-CM

## 2013-01-03 DIAGNOSIS — M129 Arthropathy, unspecified: Secondary | ICD-10-CM

## 2013-01-03 DIAGNOSIS — M1990 Unspecified osteoarthritis, unspecified site: Secondary | ICD-10-CM

## 2013-01-03 LAB — BASIC METABOLIC PANEL
CO2: 28 mEq/L (ref 19–32)
Chloride: 101 mEq/L (ref 96–112)
Potassium: 4.4 mEq/L (ref 3.5–5.3)
Sodium: 139 mEq/L (ref 135–145)

## 2013-01-03 MED ORDER — HYDROCODONE-ACETAMINOPHEN 5-325 MG PO TABS: 1.0000 | ORAL_TABLET | Freq: Four times a day (QID) | ORAL | Status: DC | PRN

## 2013-01-03 NOTE — Progress Notes (Signed)
Subjective:   Patient ID: Jeremiah Perkins male   DOB: Oct 16, 1961 52 y.o.   MRN: [id number]  HPI: [name] is a 52 y.o. who presents to clinic today for follow up from his last appointment.  He was noted to be in an acute systolic heart failure exacerbation with active chest pain.  He was transferred from the clinic to the ED for evaluation.  In the ED he was found to have two negative troponins, elevated BNP, and marked fluid overload on exam.  He declined admission for continued diuresis and troponin trending.  He was discharged on 40 mg lasix BID and scheduled for follow up today.  He states that he feels much better and his weight is down 4 lbs since Friday.  He states that he usually uses 1 big pillow and sleeps on his side or stomach.  He states that he has been waking up and having to sit on the side of the bed to catch his breath and then try to go to sleep again.    He states that he has had boils in his groin for several weeks.  They are painful but he has not seen any drainage and denies fevers, chills, nausea, or vomiting.  He hasn't tried anything to help them and hasn't had any before that he knows of.    In review of his medicine list I noted he has indomethacin on his list.  He states that he was put on that for gout and just continued taking it.  He hasn't had any gout flares and denies stomach upset, abdominal pain, or blood in his stool.    Past Medical History  Diagnosis Date  . Diabetes mellitus type 2, controlled, with complications DX: 1991    previously on insulin in 2000 for 3-4 years (but was stopped because adamantly did not want to be on insulin)  . Hypertension   . Congestive heart failure     No echo data available. Myocardial perfusion 2005 with EF 61%, presumed diastolic HF   . Diverticulosis     with history of diverticulitis in 10/2011  . Gout   . Arthritis     involving knees, ankles, back, elbows  . Major depression   . Diabetic peripheral  neuropathy   . Anxiety   . Psychosexual dysfunction with inhibited sexual excitement   . Hyperlipidemia   . OSA (obstructive sleep apnea) 1998    previously on CPAP - unable to tolerate   Current Outpatient Prescriptions  Medication Sig Dispense Refill  . albuterol (PROVENTIL HFA;VENTOLIN HFA) 108 (90 BASE) MCG/ACT inhaler Inhale 2 puffs into the lungs every 6 (six) hours as needed. For wheezing      . aspirin 81 MG tablet Take 81 mg by mouth daily.      . Blood Glucose Monitoring Suppl (AGAMATRIX PRESTO PRO METER) DEVI 1 Device by Does not apply route once.  1 Device  0  . buPROPion (WELLBUTRIN XL) 300 MG 24 hr tablet Take 1 tablet (300 mg total) by mouth daily.  30 tablet  0  . colchicine 0.6 MG tablet Take 0.5-1.2 mg by mouth 2 (two) times daily as needed. Take two pills at onset of gout. Then 1 pill in 1-2 hours then stop.      . furosemide (LASIX) 40 MG tablet Take 40 mg by mouth daily.      Marland Kitchen glucose blood (AGAMATRIX PRESTO TEST) test strip Use as instructed  100 each  12  .  indomethacin (INDOCIN) 50 MG capsule Take 1 capsule (50 mg total) by mouth 3 (three) times daily with meals.  90 capsule  0  . Liraglutide (VICTOZA) 18 MG/3ML SOLN Inject 1.8 mg into the skin daily.      Marland Kitchen lisinopril (PRINIVIL,ZESTRIL) 20 MG tablet Take 20 mg by mouth daily.      Marland Kitchen LORazepam (ATIVAN) 0.5 MG tablet Take 0.5 mg by mouth at bedtime.       . metFORMIN (GLUCOPHAGE) 500 MG tablet Take 2,000 mg by mouth every evening.      Marland Kitchen oxyCODONE-acetaminophen (PERCOCET/ROXICET) 5-325 MG per tablet Take 1 tablet by mouth every 6 (six) hours as needed. For pain      . sildenafil (VIAGRA) 100 MG tablet Take 100 mg by mouth daily as needed. For ED      . simvastatin (ZOCOR) 40 MG tablet Take 40 mg by mouth every evening.       No current facility-administered medications for this visit.   Family History  Problem Relation Age of Onset  . Diabetes Mother   . CAD Mother 73    requiring quadruple bypass  .  Hypertension Mother   . Stomach cancer Father   . Lung cancer Father     was a smoker  . Gout Father   . CAD Brother 48    requiring quadruple bypass  . Seizures Brother   . Hypertension Brother   . Diabetes Maternal Grandmother   . Alzheimer's disease Maternal Grandmother   . Alzheimer's disease Mother   . Breast cancer Paternal Aunt   . Diabetes Maternal Aunt    History   Social History  . Marital Status: Married    Spouse Name: N/A    Number of Children: 3  . Years of Education: AA   Occupational History  . Now unemployed     Barrister's clerk at [employer]   Social History Main Topics  . Smoking status: Former Smoker -- 0.10 packs/day for 10 years    Types: Cigarettes  . Smokeless tobacco: Never Used     Comment: Smokes 3 cigs per week / previously smoked 1ppd x 1-2 years  . Alcohol Use: Yes     Comment: 3-4 beers/liquor per week  . Drug Use: No  . Sexually Active: None   Other Topics Concern  . 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 They each have children but none between the two of them.   Review of Systems: A full 12 system ROS is negative except as noted in the HPI and A&P.   Objective:  Physical Exam: Filed Vitals:   01/03/13 1427  BP: 125/80  Pulse: 72  Temp: 97.5 F (36.4 C)  TempSrc: Oral  Height: 5\' 7"  (1.702 m)  Weight: 292 lb 4.8 oz (132.586 kg)  SpO2: 100%   Constitutional: Vital signs reviewed.  Patient is a well-developed and well-nourished obese man in no acute distress and cooperative with exam. Alert and oriented x3.  Head: Normocephalic and atraumatic Ear: TM normal bilaterally Mouth: no erythema or exudates, MMM Eyes: PERRL, EOMI, conjunctivae normal, No scleral icterus.  Neck: Supple, JVD present to the angle of the jaw, Trachea midline normal ROM, No mass, thyromegaly, or carotid bruit present.  Cardiovascular: RRR, S1 normal, S2 normal, no MRG, pulses symmetric and  intact bilaterally Pulmonary/Chest: mild bibasilar crackles noted.  no wheezes, or rhonchi Abdominal: Soft, obese Non-tender, non-distended, bowel sounds  are normal, no masses, organomegaly, or guarding present.  GU: no CVA tenderness Musculoskeletal: No joint deformities, erythema, or stiffness, ROM full and no nontender Hematology: no cervical, inginal, or axillary adenopathy.  Neurological: A&O x3, Strength is normal and symmetric bilaterally, cranial nerve II-XII are grossly intact, no focal motor deficit, sensory intact to light touch bilaterally.  Skin: under his panus are several red and indurated carbuncles.  No fluctuance or surrounding erythema.  Warm, dry and intact. No rash, cyanosis, or clubbing.  Psychiatric: Normal mood and affect. speech and behavior is normal. Judgment and thought content normal. Cognition and memory are normal.   Assessment & Plan:

## 2013-01-03 NOTE — Patient Instructions (Signed)
1.  Continue the lasix 40 mg twice daily for now.  - I will call you with the results of the test if we need to adjust it.  2.  With the Boils in the groin.  Work to keep the area as dry as possible.  Use a warm compress on it for 20 minutes several times a day to help it drain.    3.  Stop the Indomethicin.  If you have a flare of the gout please come back to be seen.   4.  Follow up in 1 week to recheck your fluid and weight.

## 2013-01-10 ENCOUNTER — Ambulatory Visit (INDEPENDENT_AMBULATORY_CARE_PROVIDER_SITE_OTHER): Payer: No Typology Code available for payment source | Admitting: Internal Medicine

## 2013-01-10 ENCOUNTER — Encounter: Payer: Self-pay | Admitting: Internal Medicine

## 2013-01-10 VITALS — BP 120/73 | HR 69 | Temp 97.1°F | Ht 67.0 in | Wt 287.2 lb

## 2013-01-10 DIAGNOSIS — I509 Heart failure, unspecified: Secondary | ICD-10-CM

## 2013-01-10 DIAGNOSIS — M109 Gout, unspecified: Secondary | ICD-10-CM

## 2013-01-10 DIAGNOSIS — I10 Essential (primary) hypertension: Secondary | ICD-10-CM

## 2013-01-10 DIAGNOSIS — R51 Headache: Secondary | ICD-10-CM

## 2013-01-10 LAB — BASIC METABOLIC PANEL WITH GFR
BUN: 22 mg/dL (ref 6–23)
CO2: 25 mEq/L (ref 19–32)
Chloride: 103 mEq/L (ref 96–112)
Creat: 1.15 mg/dL (ref 0.50–1.35)
GFR, Est Non African American: 72 mL/min
Glucose, Bld: 139 mg/dL — ABNORMAL HIGH (ref 70–99)
Potassium: 4.5 mEq/L (ref 3.5–5.3)

## 2013-01-10 MED ORDER — PREDNISONE 20 MG PO TABS: ORAL_TABLET | ORAL | Status: DC

## 2013-01-10 MED ORDER — FUROSEMIDE 40 MG PO TABS: 40.0000 mg | ORAL_TABLET | Freq: Every day | ORAL | Status: DC

## 2013-01-10 NOTE — Assessment & Plan Note (Addendum)
Patient is down in his weight by 5 pounds from his last visit and is close to his baseline dry weight, back in January of 2014. He denies any symptoms of shortness of breath or chest pain with today's visit. Would reduce the dose of his Lasix from 40 mg twice a day to 40 mg once a day.  Vitals - 1 value per visit 01/10/13 01/03/2013 12/31/2012 12/31/2012 12/01/2012  Weight (lb) 287 292.3  296.7 287    Check BMET today.

## 2013-01-10 NOTE — Patient Instructions (Addendum)
General Instructions: Please schedule a follow up appointment in 1 week. Please bring your medication bottles with your next appointment. Please take your medicines as prescribed. I will call you with your lab results if anything will be abnormal. I would decrease your lasix to 40 mg daily.    Treatment Goals:  Goals (1 Years of Data) as of 01/10/13         As of Today 01/03/13 12/31/12 12/31/12 12/31/12     Blood Pressure    . Blood Pressure < 140/90  120/73 125/80 107/51 115/49 172/125     Result Component    . HEMOGLOBIN A1C < 7.0          . LDL CALC < 100            Progress Toward Treatment Goals:  Treatment Goal 01/10/2013  Hemoglobin A1C at goal  Blood pressure at goal  Stop smoking -    Self Care Goals & Plans:  Self Care Goal 01/10/2013  Manage my medications take my medicines as prescribed; bring my medications to every visit; refill my medications on time; follow the sick day instructions if I am sick  Monitor my health keep track of my blood glucose; bring my glucose meter and log to each visit; keep track of my weight; check my feet daily  Eat healthy foods eat more vegetables; eat fruit for snacks and desserts; eat smaller portions; eat foods that are low in salt; eat baked foods instead of fried foods; drink diet soda or water instead of juice or soda  Be physically active find an activity I enjoy; take a walk every day  Stop smoking -    Home Blood Glucose Monitoring 01/10/2013  Check my blood sugar once a day  When to check my blood sugar before meals     Care Management & Community Referrals:  Referral 01/10/2013  Referrals made for care management support none needed

## 2013-01-10 NOTE — Progress Notes (Signed)
Subjective:   Patient ID: Jeremiah Perkins male   DOB: [birth year] 52 y.o.   MRN: [id number]  HPI: 52 year old gentleman with past medical history significant for type 2 diabetes mellitus with last Hb AIC of 6.0, hypertension,  diastolic congestive heart failure with 2D echo in 04/12 showing EF of 50-55% with concentric hypertrophy presents to the clinic for a followup visit.  Patient was seen in the clinic and ED on 12/31/12 and then again in the clinic on 01/03/13 for CHF exacerbation when he presented with symptoms of chest pain and dyspnea on exertion. His Lasix dose was increased from 40 once daily to 40 twice daily and he was advised to followup in the clinic in a week. Denies any chest pain, shortness of breath at today's visit and reports feeling better in that regards.    Vitals - 1 value per visit 01/10/13 01/03/2013 12/31/2012 12/31/2012 12/01/2012  Weight (lb) 287 292.3  296.7 287   Patient also reports noticing new onset swelling in his left great toe and ankles ( Right which is s/p reconstruction surgery has been worse) for about a week. He correlates it with stopping his indomethacin as advised by the doctors with his last clinic visit in the setting of this change kidney function. The swelling in the left great toe is associated with redness and pain, currently rates his pain 8/10 and is similar to his gout flares.   Also reports having new onset left temporal headaches for a week. He states that he was started on the Neurontin by his psychiatrist in addition to his Wellbutrin that he correlates for worsening of his headaches. He describes his headaches as dull, gnawing pain , that happens everyday, currently rates his pain 3-5/10, not associated with any visual changes. He does report that he has recently noticed a ringing sound in his ears coming up with chewing up to 4 on the left side. He does report that he feels tired and would experience some soreness on the left side of his jaw for last 2  weeks. He thought it is related to him being missing his molars on that side from his denture. He does report having similar headaches before, that have always been in the left temporal region but states that they have not been this bad or severe.      Past Medical History  Diagnosis Date  . Diabetes mellitus type 2, controlled, with complications DX: 1991    previously on insulin in 2000 for 3-4 years (but was stopped because adamantly did not want to be on insulin)  . Hypertension   . Congestive heart failure     No echo data available. Myocardial perfusion 2005 with EF 61%, presumed diastolic HF   . Diverticulosis     with history of diverticulitis in 10/2011  . Gout   . Arthritis     involving knees, ankles, back, elbows  . Major depression   . Diabetic peripheral neuropathy   . Anxiety   . Psychosexual dysfunction with inhibited sexual excitement   . Hyperlipidemia   . OSA (obstructive sleep apnea) 1998    previously on CPAP - unable to tolerate   Family History  Problem Relation Age of Onset  . Diabetes Mother   . CAD Mother 42    requiring quadruple bypass  . Hypertension Mother   . Stomach cancer Father   . Lung cancer Father     was a smoker  . Gout Father   . CAD  Brother 48    requiring quadruple bypass  . Seizures Brother   . Hypertension Brother   . Diabetes Maternal Grandmother   . Alzheimer's disease Maternal Grandmother   . Alzheimer's disease Mother   . Breast cancer Paternal Aunt   . Diabetes Maternal Aunt    History   Social History  . Marital Status: Married    Spouse Name: N/A    Number of Children: 3  . Years of Education: AA   Occupational History  . Now unemployed     Barrister's clerk at [employer]   Social History Main Topics  . Smoking status: Former Smoker -- 0.10 packs/day for 10 years    Types: Cigarettes  . Smokeless tobacco: Never Used     Comment: Smokes 3 cigs per week / previously smoked 1ppd x 1-2 years  . Alcohol Use: Yes      Comment: 3-4 beers/liquor per week  . Drug Use: No  . Sexually Active: Not on file   Other Topics Concern  . 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 They each have children but none between the two of them.   Review of Systems: General: Denies fever, chills, diaphoresis, appetite change and fatigue. HEENT: Denies photophobia, eye pain, redness, hearing loss, ear pain, congestion, sore throat, rhinorrhea, sneezing, mouth sores, trouble swallowing, neck pain, neck stiffness and tinnitus. Respiratory: Denies SOB, DOE, cough, chest tightness, and wheezing. Cardiovascular: Denies to chest pain, palpitations and leg swelling. Gastrointestinal: Denies nausea, vomiting, abdominal pain, diarrhea, constipation, blood in stool and abdominal distention. Genitourinary: Denies dysuria, urgency, frequency, hematuria, flank pain and difficulty urinating. Musculoskeletal: Denies myalgias, back pain, joint swelling, arthralgias and gait problem.  Skin: Denies pallor, rash and wound. Neurological: Denies dizziness, seizures, syncope, weakness, light-headedness, numbness and headaches. Hematological: Denies adenopathy, easy bruising, personal or family bleeding history. Psychiatric/Behavioral: Denies suicidal ideation, mood changes, confusion, nervousness, sleep disturbance and agitation.    Current Outpatient Medications: Current Outpatient Prescriptions  Medication Sig Dispense Refill  . albuterol (PROVENTIL HFA;VENTOLIN HFA) 108 (90 BASE) MCG/ACT inhaler Inhale 2 puffs into the lungs every 6 (six) hours as needed. For wheezing      . aspirin 81 MG tablet Take 81 mg by mouth daily.      . Blood Glucose Monitoring Suppl (AGAMATRIX PRESTO PRO METER) DEVI 1 Device by Does not apply route once.  1 Device  0  . buPROPion (WELLBUTRIN XL) 300 MG 24 hr tablet Take 1 tablet (300 mg total) by mouth daily.  30 tablet  0  . colchicine 0.6 MG  tablet Take 0.5-1.2 mg by mouth 2 (two) times daily as needed. Take two pills at onset of gout. Then 1 pill in 1-2 hours then stop.      . furosemide (LASIX) 40 MG tablet Take 40 mg by mouth daily.      Marland Kitchen glucose blood (AGAMATRIX PRESTO TEST) test strip Use as instructed  100 each  12  . HYDROcodone-acetaminophen (NORCO/VICODIN) 5-325 MG per tablet Take 1 tablet by mouth every 6 (six) hours as needed for pain.  60 tablet  0  . indomethacin (INDOCIN) 50 MG capsule Take 1 capsule (50 mg total) by mouth 3 (three) times daily with meals.  90 capsule  0  . Liraglutide (VICTOZA) 18 MG/3ML SOLN Inject 1.8 mg into the skin daily.      Marland Kitchen lisinopril (PRINIVIL,ZESTRIL) 20 MG tablet Take 20 mg  by mouth daily.      Marland Kitchen LORazepam (ATIVAN) 0.5 MG tablet Take 0.5 mg by mouth at bedtime.       . metFORMIN (GLUCOPHAGE) 500 MG tablet Take 2,000 mg by mouth every evening.      . sildenafil (VIAGRA) 100 MG tablet Take 100 mg by mouth daily as needed. For ED      . simvastatin (ZOCOR) 40 MG tablet Take 40 mg by mouth every evening.       No current facility-administered medications for this visit.    Allergies: Allergies  Allergen Reactions  . Other     Eggs - nausea/vomiting (did ok with the flu shot, seems to be an issue with the type of preparation of the egg product)  . Sulfa Antibiotics Nausea And Vomiting      Objective:   Physical Exam: Filed Vitals:   01/10/13 0912  BP: 120/73  Pulse: 69  Temp: 97.1 F (36.2 C)    General: Vital signs reviewed and noted. Well-developed, well-nourished, in no acute distress; alert, appropriate and cooperative throughout examination. Head: Normocephalic, atraumatic Lungs: Normal respiratory effort. Clear to auscultation BL without crackles or wheezes. Heart: RRR. S1 and S2 normal without gallop, murmur, or rubs. Abdomen:BS normoactive. Soft, Nondistended, non-tender.  No masses or organomegaly. 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 Left ankle mild swollen. Right ankle swollen but no erythema, tenderness of warmth on either of the ankles.      Assessment & Plan:

## 2013-01-10 NOTE — Assessment & Plan Note (Signed)
He presents with gout flare likely related to increased diuresis from increase in the dose of lasix ( As per Up to date- Loop and thiazide diuretics decrease urate excretion by increasing net urate reabsorption; this can occur either by enhanced reabsorption or by reduced secretion). On exam he was noted to have red swollen left great toe with warmth and tenderness , mild ankle ( L> R) but no erythema, warmth or tenderness.  - Treat him with tapering dose of prednisone given his borderline elevated creatinine in the recent past. - Continue colchicine. - Would consider starting him on allopurinol after this acute flare resolves. - Continue to hold indomethacin for now. - Decrease lasix does from 40 mg BID to once daily.  - Try to get and review old notes from his previous physicians to see if he has crystal proven diagnosis. Fluid does not seem to be sufficient for arthrocentesis with this flare.

## 2013-01-11 NOTE — Assessment & Plan Note (Signed)
Reports left temporal headaches for several months- worse recently for last 2 weeks. Never had similar intensity headaches before, associated with? Jaw claudication. He was recently started on Neurontin and states that his headaches have been worse within the same timeframe of the initiation of new medicine .Differentials for his headaches include temporal arteritis versus  side effects from neurontin versus medication abuse headaches. Given the location and mild tenderness to palpation in the left temporal area with symptoms of possible jaw claudication, we'll check ESR and CRP to rule out temporal arteritis. ESR and CRP would be confounded given his gout flare but would be helpful to decrease the probability of  temporal arteritis if ESR is less than 40. There is a possibility that he could have worsening headaches from being started on Neurontin.  - Check ESR CRP. - Advised him to followup with his psychiatrist to discuss the medications.  Update- his ESR is 27. With ESR less than 40, diagnoses of temporal arteritis is less likely. Continue to monitor his headaches for now. He was called and informed about the lab results. He was also advised to call his psychiatrist and discuss the medication change.

## 2013-01-11 NOTE — Assessment & Plan Note (Signed)
Blood pressure at goal. Continue current medicines.

## 2013-01-17 ENCOUNTER — Encounter: Payer: Self-pay | Admitting: Internal Medicine

## 2013-01-17 ENCOUNTER — Ambulatory Visit (INDEPENDENT_AMBULATORY_CARE_PROVIDER_SITE_OTHER): Payer: No Typology Code available for payment source | Admitting: Internal Medicine

## 2013-01-17 VITALS — BP 144/89 | HR 79 | Temp 97.1°F | Ht 67.0 in | Wt 288.8 lb

## 2013-01-17 NOTE — Assessment & Plan Note (Signed)
Her BP was mildly elevated. He states that he did not take his Lasix yet. Continue to monitor on current medications for now.

## 2013-01-17 NOTE — Assessment & Plan Note (Signed)
Patient is interested in bariatric surgery. With his BMI more than 40, he meets an indication for bariatric surgery. I think this would into one help with his OSA, gout and hypertension. Patient was given handout to first attend the classes at [facility]. Would continue to follow on that.

## 2013-01-17 NOTE — Assessment & Plan Note (Signed)
Followup for his gout flare- significant improvement in the pain and swelling of left great toe and right ankle. He is taking his prednisone taper as directed. - Continue prednisone taper. - Reschedule a followup appointment in a month, when we can check his uric acid and start him on allopurinol along with colchicine for prevention of future gouty attacks. - Continue colchicine at current dose for now.  - He was thinking that indomethacin prevents gout attacks. He was educated that indomethacin is not for gout prevention. It is the treatment for acute flares.

## 2013-01-17 NOTE — Assessment & Plan Note (Signed)
His  hemoglobin A1c at goal. His CBG log was reviewed and shows blood sugars in the low too high 100's, with average of 1 49 mg/dL.  Continue current regimen for her to victoza and metformin for now.

## 2013-01-17 NOTE — Progress Notes (Signed)
Subjective:   Patient ID: Jeremiah Perkins male   DOB: 1961/10/05 52 y.o.   MRN: [id number]  HPI: 52 year old gentleman with past medical history significant for type 2 diabetes mellitus, hypertension, gout and morbid obesity presents to the clinic for a followup appointment.   Gout : He was seen in the clinic about 2 weeks ago for acute gouty flare and was started on prednisone. He reports that his left great toe and right ankle feels much better - decreased pain and swelling.  Headaches: Improved . Reported unilateral throbbing left temple headaches ( though to be a side effect from neurontin, started by his psychiatrist). He has not spoken to his psychiatrist yet.       Past Medical History  Diagnosis Date  . Diabetes mellitus type 2, controlled, with complications DX: 1991    previously on insulin in 2000 for 3-4 years (but was stopped because adamantly did not want to be on insulin)  . Hypertension   . Congestive heart failure     No echo data available. Myocardial perfusion 2005 with EF 61%, presumed diastolic HF   . Diverticulosis     with history of diverticulitis in 10/2011  . Gout   . Arthritis     involving knees, ankles, back, elbows  . Major depression   . Diabetic peripheral neuropathy   . Anxiety   . Psychosexual dysfunction with inhibited sexual excitement   . Hyperlipidemia   . OSA (obstructive sleep apnea) 1998    previously on CPAP - unable to tolerate   Family History  Problem Relation Age of Onset  . Diabetes Mother   . CAD Mother 8    requiring quadruple bypass  . Hypertension Mother   . Stomach cancer Father   . Lung cancer Father     was a smoker  . Gout Father   . CAD Brother 48    requiring quadruple bypass  . Seizures Brother   . Hypertension Brother   . Diabetes Maternal Grandmother   . Alzheimer's disease Maternal Grandmother   . Alzheimer's disease Mother   . Breast cancer Paternal Aunt   . Diabetes Maternal Aunt    History    Social History  . Marital Status: Married    Spouse Name: N/A    Number of Children: 3  . Years of Education: AA   Occupational History  . Now unemployed     Barrister's clerk at [employer]   Social History Main Topics  . Smoking status: Former Smoker -- 0.10 packs/day for 10 years    Types: Cigarettes  . Smokeless tobacco: Never Used     Comment: Smokes 3 cigs per week / previously smoked 1ppd x 1-2 years  . Alcohol Use: Yes     Comment: 3-4 beers/liquor per week  . Drug Use: No  . Sexually Active: Not on file   Other Topics Concern  . 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 They each have children but none between the two of them.   Review of Systems: General: Denies fever, chills, diaphoresis, appetite change and fatigue. HEENT: Denies photophobia, eye pain, redness, hearing loss, ear pain, congestion, sore throat, rhinorrhea, sneezing, mouth sores, trouble swallowing, neck pain, neck stiffness and tinnitus. Respiratory: Denies SOB, DOE, cough, chest tightness, and wheezing. Cardiovascular: Denies to chest pain, palpitations and leg swelling. Gastrointestinal: Denies nausea, vomiting, abdominal pain, diarrhea, constipation, blood in stool  and abdominal distention. Genitourinary: Denies dysuria, urgency, frequency, hematuria, flank pain and difficulty urinating. Musculoskeletal: Denies myalgias, back pain, joint swelling, arthralgias and gait problem.  Skin: Denies pallor, rash and wound. Neurological: Denies dizziness, seizures, syncope, weakness, light-headedness, numbness and headaches. Hematological: Denies adenopathy, easy bruising, personal or family bleeding history. Psychiatric/Behavioral: Denies suicidal ideation, mood changes, confusion, nervousness, sleep disturbance and agitation.    Current Outpatient Medications: Current Outpatient Prescriptions  Medication Sig Dispense Refill  .  albuterol (PROVENTIL HFA;VENTOLIN HFA) 108 (90 BASE) MCG/ACT inhaler Inhale 2 puffs into the lungs every 6 (six) hours as needed. For wheezing      . aspirin 81 MG tablet Take 81 mg by mouth daily.      . Blood Glucose Monitoring Suppl (AGAMATRIX PRESTO PRO METER) DEVI 1 Device by Does not apply route once.  1 Device  0  . buPROPion (WELLBUTRIN XL) 300 MG 24 hr tablet Take 1 tablet (300 mg total) by mouth daily.  30 tablet  0  . colchicine 0.6 MG tablet Take 0.5-1.2 mg by mouth 2 (two) times daily as needed. Take two pills at onset of gout. Then 1 pill in 1-2 hours then stop.      . furosemide (LASIX) 40 MG tablet Take 1 tablet (40 mg total) by mouth daily.  30 tablet  3  . glucose blood (AGAMATRIX PRESTO TEST) test strip Use as instructed  100 each  12  . HYDROcodone-acetaminophen (NORCO/VICODIN) 5-325 MG per tablet Take 1 tablet by mouth every 6 (six) hours as needed for pain.  60 tablet  0  . indomethacin (INDOCIN) 50 MG capsule Take 1 capsule (50 mg total) by mouth 3 (three) times daily with meals.  90 capsule  0  . Liraglutide (VICTOZA) 18 MG/3ML SOLN Inject 1.8 mg into the skin daily.      Marland Kitchen lisinopril (PRINIVIL,ZESTRIL) 20 MG tablet Take 20 mg by mouth daily.      Marland Kitchen LORazepam (ATIVAN) 0.5 MG tablet Take 0.5 mg by mouth at bedtime.       . metFORMIN (GLUCOPHAGE) 500 MG tablet Take 2,000 mg by mouth every evening.      . predniSONE (DELTASONE) 20 MG tablet Take 3 tabs by mouth for 4 days, 2 tabs by mouth for 4 days, 1 tab by mouth for 4 days and follow up in the clinic before stopping the meds.  30 tablet  0  . sildenafil (VIAGRA) 100 MG tablet Take 100 mg by mouth daily as needed. For ED      . simvastatin (ZOCOR) 40 MG tablet Take 40 mg by mouth every evening.       No current facility-administered medications for this visit.    Allergies: Allergies  Allergen Reactions  . Other     Eggs - nausea/vomiting (did ok with the flu shot, seems to be an issue with the type of preparation of  the egg product)  . Sulfa Antibiotics Nausea And Vomiting      Objective:   Physical Exam: Filed Vitals:   01/17/13 0952  BP: 144/89  Pulse: 79  Temp: 97.1 F (36.2 C)    General: Vital signs reviewed and noted. Well-developed, well-nourished, in no acute distress; alert, appropriate and cooperative throughout examination, morbidly obese Head: Normocephalic, atraumatic Lungs: Normal respiratory effort. Clear to auscultation BL without crackles or wheezes. Heart: RRR. S1 and S2 normal without gallop, murmur, or rubs. Abdomen:BS normoactive. Soft, Nondistended, non-tender.  No masses or organomegaly. Extremities: No pretibial edema.Left elbow  tophi noted Left great toe has significantly decreased swelling, redness and tenderness. Right ankle has decreased swelling     Assessment & Plan:

## 2013-01-17 NOTE — Assessment & Plan Note (Signed)
He reports that his headaches are better. He was started on Neurontin by her psychiatrist about 2 weeks prior to the onset of headaches. I think his headaches are likely secondary to medication side effects. He hasn't spoken to his psychiatrist yet. He was advised to call his psychiatrist to discuss the possibility of changes in his medication regimen. He verbalizes understanding.

## 2013-01-17 NOTE — Patient Instructions (Addendum)
General Instructions: Please schedule a follow up appointment in 1 months . Please bring your medication bottles with your next appointment. Please take your medicines as prescribed. He was given the information on bariatric surgery.      Treatment Goals:  Goals (1 Years of Data) as of 01/17/13         As of Today 01/10/13 01/03/13 12/31/12 12/31/12     Blood Pressure    . Blood Pressure < 140/90  144/89 120/73 125/80 107/51 115/49     Result Component    . HEMOGLOBIN A1C < 7.0          . LDL CALC < 100            Progress Toward Treatment Goals:  Treatment Goal 01/17/2013  Hemoglobin A1C at goal  Blood pressure at goal  Stop smoking -    Self Care Goals & Plans:  Self Care Goal 01/17/2013  Manage my medications take my medicines as prescribed; bring my medications to every visit; refill my medications on time; follow the sick day instructions if I am sick  Monitor my health keep track of my blood glucose; bring my glucose meter and log to each visit; keep track of my weight; check my feet daily  Eat healthy foods eat more vegetables; eat fruit for snacks and desserts; eat baked foods instead of fried foods; eat foods that are low in salt; eat smaller portions; drink diet soda or water instead of juice or soda  Be physically active find an activity I enjoy; take a walk every day  Stop smoking -    Home Blood Glucose Monitoring 01/17/2013  Check my blood sugar once a day  When to check my blood sugar before breakfast     Care Management & Community Referrals:  Referral 01/17/2013  Referrals made for care management support none needed

## 2013-01-17 NOTE — Assessment & Plan Note (Signed)
He reports that he has been taking Viagra but states that it doesn't help him much. We discussed the options of intracavernosal  Or intraurethral penile injections ( like alpraprostodil) and penile implants in future if needed.

## 2013-01-18 ENCOUNTER — Other Ambulatory Visit: Payer: Self-pay | Admitting: Internal Medicine

## 2013-01-18 MED ORDER — MEGA MULTIVITAMIN FOR MEN PO TABS: 1.0000 | ORAL_TABLET | Freq: Every morning | ORAL | Status: DC

## 2013-01-19 ENCOUNTER — Ambulatory Visit (INDEPENDENT_AMBULATORY_CARE_PROVIDER_SITE_OTHER): Payer: No Typology Code available for payment source | Admitting: Dietician

## 2013-01-19 VITALS — Ht 67.0 in | Wt 288.0 lb

## 2013-01-19 NOTE — Progress Notes (Signed)
Medical Nutrition Therapy:  Appt start time: 1500 end time:  1540.  Assessment:  Primary concerns today: Weight management.  Patient desires weight loss and reduce medication burden/side effects.  Usual eating pattern includes 3 meals and 2 snacks per day.meal pattern is breakfast, lunch, snack, dinner and evening snack. Usual physical activity includes difficult due to weight and physical limitations, but he says he is working on increasing activity as able. Attended Bariatric surgery education class yesterday and says this is his last option. Estimated needs 1800-200 calories, 55-60 grams fat, 200-225 gram carb/day. Reports blood sugars fasting ~ 130 and after meals as high as 171. Labs and meds noted. Everyday foods include eggs, fruit, water, sweet tea.  Avoided foods include fried foods except 1x/week.  Sleep study pending  Progress Towards Goal(s):  In progress.   Nutritional Diagnosis:  [place]-3.3 Overweight/obesity As related to history of excess calorie intake.  As evidenced by his BMI of 49.    Intervention:  Nutrition education about calorie budgeting.  Nutrition education about target blood sugars, encourage < 110 fasting and < 140 postprandial.   Monitoring/Evaluation:  Dietary intake, exercise, and body weight in 2 day(s).

## 2013-01-20 ENCOUNTER — Ambulatory Visit (HOSPITAL_BASED_OUTPATIENT_CLINIC_OR_DEPARTMENT_OTHER): Payer: No Typology Code available for payment source | Attending: Internal Medicine

## 2013-01-20 ENCOUNTER — Telehealth: Payer: Self-pay | Admitting: *Deleted

## 2013-01-20 VITALS — Ht 67.0 in | Wt 285.0 lb

## 2013-01-20 DIAGNOSIS — G4710 Hypersomnia, unspecified: Secondary | ICD-10-CM | POA: Insufficient documentation

## 2013-01-20 DIAGNOSIS — Z7982 Long term (current) use of aspirin: Secondary | ICD-10-CM | POA: Insufficient documentation

## 2013-01-20 DIAGNOSIS — Z79899 Other long term (current) drug therapy: Secondary | ICD-10-CM | POA: Insufficient documentation

## 2013-01-20 NOTE — Telephone Encounter (Signed)
Multiple Vitamin rx per Dr Dorthula Rue called to [facility] per pt's request.

## 2013-01-21 ENCOUNTER — Ambulatory Visit (INDEPENDENT_AMBULATORY_CARE_PROVIDER_SITE_OTHER): Payer: No Typology Code available for payment source | Admitting: Dietician

## 2013-01-21 ENCOUNTER — Ambulatory Visit: Payer: No Typology Code available for payment source | Admitting: Dietician

## 2013-01-21 VITALS — Ht 67.0 in | Wt 285.6 lb

## 2013-01-21 NOTE — Progress Notes (Signed)
Medical Nutrition Therapy:  Appt start time: 0930 end time: 1030.  Assessment:  Primary concerns today: Weight management.  Patient agreed to intensive weekly session for 16 weeks followed by monthly session x 6 months.Estimated needs 1800-2000 calories, 55-60 grams fat, 200-225 gram carb/day.  1. Ht :67"          Wt:285.6#              BMI:                       Wt Change since last visit: decreased 2.4# 2.Structured  Diet: calorie couting 2000 calories per day 3. Medication for weight loss: Victoza 4. Exercise plan: type:walking   Frequency: 5 days a week    Duration: 10 minutes      Intensity: moderate   Titration: increase as able to 30 minutes a day 5 days a week 5. Behavior modification interventions: ground rules, support systems, self monitoring 6. Assessment of Patient progress: excellent  Progress Towards Goal(s):  In progress.   Nutritional Diagnosis:  [place]-3.3 Overweight/obesity As related to history of excess calorie intake.  As evidenced by his BMI of 49.    Intervention:  Nutrition education about keeping food records.counting calories.    Monitoring/Evaluation:  Dietary intake, exercise, and body weight in 1 week.

## 2013-01-21 NOTE — Patient Instructions (Signed)
.  Complete daily food records, including what you eat and drink, what time, and how much.   Circle foods and drinks you think are high in fat and calories Bring with you next week

## 2013-01-28 ENCOUNTER — Encounter: Payer: No Typology Code available for payment source | Admitting: Dietician

## 2013-01-28 ENCOUNTER — Ambulatory Visit: Payer: No Typology Code available for payment source | Admitting: Dietician

## 2013-01-28 DIAGNOSIS — R0609 Other forms of dyspnea: Secondary | ICD-10-CM

## 2013-01-28 DIAGNOSIS — G4733 Obstructive sleep apnea (adult) (pediatric): Secondary | ICD-10-CM

## 2013-01-28 DIAGNOSIS — R0989 Other specified symptoms and signs involving the circulatory and respiratory systems: Secondary | ICD-10-CM

## 2013-01-29 NOTE — Procedures (Signed)
Jeremiah Perkins, Jeremiah Perkins             ACCOUNT NO.:  [id number]  MEDICAL RECORD NO.:  [id number]          PATIENT TYPE:  OUT  LOCATION:  SLEEP CENTER                 FACILITY:  [facility]  PHYSICIAN:  Clinton D. Maple Hudson, MD, FCCP, FACPDATE OF BIRTH:  1958/12/01  DATE OF STUDY:  01/20/2013                           NOCTURNAL POLYSOMNOGRAM  REFERRING PHYSICIAN:  Blanch Media, M.D.  INDICATION FOR STUDY:  Hypersomnia with sleep apnea.  EPWORTH SLEEPINESS SCORE:  13/24.  BMI 44.6, weight 285 pounds, height 67 inches, neck 19 inches.  MEDICATIONS:  Home medications are charted and reviewed.  SLEEP ARCHITECTURE:  Total sleep time 263.5 minutes with sleep efficiency 72.2%.  Stage I was 13.1%, stage II 78.6%,  stage III absent, REM 8.3% of total sleep time.  Sleep latency 7.5 minutes, REM latency 102.5 minutes, awake after sleep onset 22.5 minutes.  Arousal index 29.6.  BEDTIME MEDICATION:  Furosemide, Ambien, aspirin, lisinopril, simvastatin, oxycodone.  RESPIRATORY DATA:  Apnea-hypopnea index (AHI) 55.8 per hour.  A total of 245 events was scored including 66 obstructive apneas, 1 central apnea, 1 mixed apnea, 177 hypopneas.  Most events were associated with non- supine sleep position and REM.  REM AHI 54.5 per hour.  The study was performed as ordered, as a diagnostic NPSG protocol without CPAP.  OXYGEN DATA:  Very loud snoring with oxygen desaturation to a nadir of 85% and mean oxygen saturation through the study of 93.7% on room air.  CARDIAC DATA:  Sinus rhythm with PVC.  MOVEMENT-PARASOMNIA:  No significant movement disturbance.  No bathroom trips.  IMPRESSIONS-RECOMMENDATIONS: 1. Sleep architecture significant for several short spontaneous     wakings before final waking at 4 a.m.  He was unable to regain     sleep after that.  Bedtime medications as noted above. 2. Severe obstructive sleep apnea/hypopnea syndrome, apnea-hypopnea     index 55.8 per hour.  Non-positional  events.  Rapid eye movement     apnea-hypopnea index 54.5.  Very loud snoring with oxygen     desaturation to a nadir of 85% and mean oxygen saturation through     the study of 93.7% on room air 3. The study was ordered as a diagnostic nocturnal polysomnogram     protocol without continuous positive airway     pressure.  Consider return for a dedicated continuous positive     airway pressure titration study or evaluate for alternative     management as clinically appropriate.     Clinton D. Maple Hudson, MD, [facility], FACP Diplomate, American Board of Sleep Medicine    CDY/MEDQ  D:  01/28/2013 17:26:20  T:  01/29/2013 07:43:54  Job:  [id number]

## 2013-02-01 ENCOUNTER — Ambulatory Visit (INDEPENDENT_AMBULATORY_CARE_PROVIDER_SITE_OTHER): Payer: No Typology Code available for payment source | Admitting: Dietician

## 2013-02-01 VITALS — Ht 67.0 in | Wt 285.7 lb

## 2013-02-01 DIAGNOSIS — E118 Type 2 diabetes mellitus with unspecified complications: Secondary | ICD-10-CM

## 2013-02-01 NOTE — Progress Notes (Signed)
Medical Nutrition Therapy: Appt start time: 0955 end time: 1035.  Assessment: Primary concerns today: Weight management.  Week #3/16 weeks Intensive Behavioral Therapy for weight loss.Estimated needs 1800-2000 calories, 55-60 grams fat, 200-225 gram carb/day. Intake per pt records ~ 2100 calories nad 58 grams fat/day 1. Ht :67" Wt:285.7# BMI: Wt Change since last visit: no change  2.Structured Diet: calorie & fat gram counting daily 3. Medication for weight loss: Victoza  4. Exercise plan: type:walking Frequency: 5 days a week Duration: 30 minutes Intensity: moderate Titration: increase as able to 60 minutes a day 5 days a week  5. Behavior modification interventions: support systems, self monitoring, measuring/weighing food   6. Assessment of Patient progress: excellent , feet hurting from walking Progress Towards Goal(s): In progress.  Nutritional Diagnosis:  [place]-3.3 Overweight/obesity As related to history of excess calorie intake. As evidenced by his BMI of 49.   Intervention: Nutrition education: keeping food records.counting calories,fat grams. Coordination of care- discuss activity/foot pain with his PCP   Monitoring/Evaluation: Dietary intake, exercise, and body weight in 1 week.

## 2013-02-04 ENCOUNTER — Telehealth: Payer: Self-pay | Admitting: Internal Medicine

## 2013-02-04 ENCOUNTER — Encounter: Payer: Self-pay | Admitting: Internal Medicine

## 2013-02-04 NOTE — Telephone Encounter (Signed)
Informed pt and wife of sleep study results. Had sleep study and CPAP in 1999. Couldn't tolerate the mask. Willing to have CPAP titration study. Trying to get soc Security Disability and wants results to lawyer. Asked pt to have lawyer send release for results.

## 2013-02-11 ENCOUNTER — Ambulatory Visit (INDEPENDENT_AMBULATORY_CARE_PROVIDER_SITE_OTHER): Payer: No Typology Code available for payment source | Admitting: Dietician

## 2013-02-11 VITALS — Ht 67.0 in | Wt 283.4 lb

## 2013-02-11 DIAGNOSIS — E118 Type 2 diabetes mellitus with unspecified complications: Secondary | ICD-10-CM

## 2013-02-11 NOTE — Patient Instructions (Addendum)
Great work- you lost 2.4 # in the past 10 days.   Please look at pages we did not complete and assignments  See you next week at 1:15 PM.

## 2013-02-11 NOTE — Progress Notes (Signed)
Medical Nutrition Therapy: Appt start time: 0935 end time: 1035.   Assessment: Primary concerns today: Weight management.   Week #4/16 weeks Intensive Behavioral Therapy for weight loss.Estimated needs 1800-2000 calories, 55-60 grams fat, 200-225 gram carb/day. Intake per pt records ~ 2000 calories /day 1. Ht :67" Wt:283.3# BMI: Wt Change since last visit: decreased 2.4#  2.Structured Diet: calorie & fat gram counting daily 3. Medication for weight loss: Victoza  4. Exercise plan: type:walking Frequency: 5 days a week Duration: 30 minutes Intensity: moderate Titration: increase as able to 60 minutes a day 5 days a week  5. Behavior modification interventions: Healthy eating using plate method 6. Assessment of Patient progress: excellent, feet hurting from walking  Progress Towards Goal(s): showing  progress.   Nutritional Diagnosis:  [place]-3.3 Overweight/obesity As related to history of excess calorie intake. As evidenced by his BMI of 49 is improving.   Intervention: Nutrition education: Healthy eating using the plate method, discussed how and when eating should happen,  Patient to keep food records.counting calories. Coordination of care- investigate non-weight bearing activities, BELT program and other community programs  Monitoring/Evaluation: Dietary intake, exercise, and body weight in 1 week.

## 2013-02-18 ENCOUNTER — Encounter: Payer: Self-pay | Admitting: Internal Medicine

## 2013-02-18 ENCOUNTER — Encounter: Payer: No Typology Code available for payment source | Admitting: Dietician

## 2013-02-18 ENCOUNTER — Ambulatory Visit (INDEPENDENT_AMBULATORY_CARE_PROVIDER_SITE_OTHER): Payer: No Typology Code available for payment source | Admitting: Internal Medicine

## 2013-02-18 VITALS — BP 138/80 | HR 81 | Temp 97.1°F | Ht 67.0 in | Wt 282.2 lb

## 2013-02-18 DIAGNOSIS — G4733 Obstructive sleep apnea (adult) (pediatric): Secondary | ICD-10-CM

## 2013-02-18 DIAGNOSIS — I509 Heart failure, unspecified: Secondary | ICD-10-CM

## 2013-02-18 DIAGNOSIS — E119 Type 2 diabetes mellitus without complications: Secondary | ICD-10-CM

## 2013-02-18 DIAGNOSIS — M109 Gout, unspecified: Secondary | ICD-10-CM

## 2013-02-18 DIAGNOSIS — E118 Type 2 diabetes mellitus with unspecified complications: Secondary | ICD-10-CM

## 2013-02-18 DIAGNOSIS — Z79899 Other long term (current) drug therapy: Secondary | ICD-10-CM

## 2013-02-18 LAB — GLUCOSE, CAPILLARY: Glucose-Capillary: 183 mg/dL — ABNORMAL HIGH (ref 70–99)

## 2013-02-18 MED ORDER — ALLOPURINOL 100 MG PO TABS: 100.0000 mg | ORAL_TABLET | Freq: Every day | ORAL | Status: DC

## 2013-02-18 NOTE — Patient Instructions (Signed)
It was good to see you today.   In terms of your gout. We will start you on Allopurinol and then check your uric acid level in May.   In terms of your joint pain you can increase your hydrocodone to 1-2 tablets every 4-6 hrs as needed.   Good luck with the sleep study and hope to see you back. Always a pleasure.  If you have any questions don't hesitate to call our clinic.   See you back in May.

## 2013-02-18 NOTE — Progress Notes (Signed)
Subjective:   Patient ID: Jeremiah Perkins male   DOB: May 20, 1961 52 y.o.   MRN: [id number]  HPI: Jeremiah Perkins is a 52 y.o. a past medical history of diabetes type 2, CHF, hypertension, gout, OSA, and depression who presents for followup. Pt has had several gout flares within the past months and was not started back on indomethicin. Today gout flare has resolved and is interested in starting allopurinol. Patient is no longer having headaches and continues to see a psychiatrist in terms of increasing his gabapentin.    Past Medical History  Diagnosis Date  . Diabetes mellitus type 2, controlled, with complications DX: 1991    previously on insulin in 2000 for 3-4 years (but was stopped because adamantly did not want to be on insulin)  . Hypertension   . Congestive heart failure     No echo data available. Myocardial perfusion 2005 with EF 61%, presumed diastolic HF   . Diverticulosis     with history of diverticulitis in 10/2011  . Gout   . Arthritis     involving knees, ankles, back, elbows  . Major depression   . Diabetic peripheral neuropathy   . Anxiety   . Psychosexual dysfunction with inhibited sexual excitement   . Hyperlipidemia   . OSA (obstructive sleep apnea) 1998    previously on CPAP - unable to tolerate   Current Outpatient Prescriptions  Medication Sig Dispense Refill  . albuterol (PROVENTIL HFA;VENTOLIN HFA) 108 (90 BASE) MCG/ACT inhaler Inhale 2 puffs into the lungs every 6 (six) hours as needed. For wheezing      . aspirin 81 MG tablet Take 81 mg by mouth daily.      . Blood Glucose Monitoring Suppl (AGAMATRIX PRESTO PRO METER) DEVI 1 Device by Does not apply route once.  1 Device  0  . buPROPion (WELLBUTRIN XL) 300 MG 24 hr tablet Take 1 tablet (300 mg total) by mouth daily.  30 tablet  0  . colchicine 0.6 MG tablet Take 0.5-1.2 mg by mouth 2 (two) times daily as needed. Take two pills at onset of gout. Then 1 pill in 1-2 hours then stop.      .  furosemide (LASIX) 40 MG tablet Take 1 tablet (40 mg total) by mouth daily.  30 tablet  3  . glucose blood (AGAMATRIX PRESTO TEST) test strip Use as instructed  100 each  12  . HYDROcodone-acetaminophen (NORCO/VICODIN) 5-325 MG per tablet Take 1 tablet by mouth every 6 (six) hours as needed for pain.  60 tablet  0  . Liraglutide (VICTOZA) 18 MG/3ML SOLN Inject 1.8 mg into the skin daily.      Marland Kitchen lisinopril (PRINIVIL,ZESTRIL) 20 MG tablet Take 20 mg by mouth daily.      Marland Kitchen LORazepam (ATIVAN) 0.5 MG tablet Take 0.5 mg by mouth at bedtime.       . metFORMIN (GLUCOPHAGE) 500 MG tablet Take 2,000 mg by mouth every evening.      . Multiple Vitamins-Minerals (MEGA MULTIVITAMIN FOR MEN) TABS Take 1 capsule by mouth every morning.  30 tablet  3  . predniSONE (DELTASONE) 20 MG tablet Take 3 tabs by mouth for 4 days, 2 tabs by mouth for 4 days, 1 tab by mouth for 4 days and follow up in the clinic before stopping the meds.  30 tablet  0  . sildenafil (VIAGRA) 100 MG tablet Take 100 mg by mouth daily as needed. For ED      .  simvastatin (ZOCOR) 40 MG tablet Take 40 mg by mouth every evening.       No current facility-administered medications for this visit.   Family History  Problem Relation Age of Onset  . Diabetes Mother   . CAD Mother 91    requiring quadruple bypass  . Hypertension Mother   . Stomach cancer Father   . Lung cancer Father     was a smoker  . Gout Father   . CAD Brother 48    requiring quadruple bypass  . Seizures Brother   . Hypertension Brother   . Diabetes Maternal Grandmother   . Alzheimer's disease Maternal Grandmother   . Alzheimer's disease Mother   . Breast cancer Paternal Aunt   . Diabetes Maternal Aunt    History   Social History  . Marital Status: Married    Spouse Name: N/A    Number of Children: 3  . Years of Education: AA   Occupational History  . Now unemployed     Barrister's clerk at [employer]   Social History Main Topics  . Smoking status: Former  Smoker -- 0.10 packs/day for 10 years    Types: Cigarettes  . Smokeless tobacco: Never Used     Comment: Smokes 3 cigs per week / previously smoked 1ppd x 1-2 years  . Alcohol Use: Yes     Comment: 3-4 beers/liquor per week  . Drug Use: No  . Sexually Active: None   Other Topics Concern  . 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 They each have children but none between the two of them.   Review of Systems: otherwise negative unless listed in HPI  Objective:  Physical Exam: Filed Vitals:   02/18/13 1404  BP: 138/80  Pulse: 81  Temp: 97.1 F (36.2 C)  TempSrc: Oral  Height: 5\' 7"  (1.702 m)  Weight: 282 lb 3.2 oz (128.005 kg)  SpO2: 99%   General: NAD, well nourished, well developed HEENT: PERRL, EOMI, no scleral icterus Cardiac: RRR, no rubs, murmurs or gallops Pulm: clear to auscultation bilaterally, moving normal volumes of air Abd: soft, moderately obese, nontender, nondistended, BS present Ext: warm and well perfused, no pedal edema Neuro: alert and oriented X3, cranial nerves II-XII grossly intact  Assessment & Plan:  1. Gout: pt last uric acid was 10. Pt not in current flare and only taking colchicine.  -added allopurinol 100mg  daily -repeat uric acid level in 1 month to evaluate titration of allopurinol  2. DM: hgb A1c today 6.9 achieving good control will continue current treatment. Pt continues to see diabetes educator and working on dramatic dietary and exercise changes. Pt has neuropathy and currently taking gabapentin for his depression and will consider increasing if his psychiatrist as an increase whom he will be seen later on in April. This was discussed with the patient.  3. OSA: pt will f/u for cpap titration  4. CHF: Pt continues to lose weight and is currently taking Lasix 40 mg twice a day Filed Weights   02/18/13 1404  Weight: 282 lb 3.2 oz (128.005 kg)    Patient was discussed  with Dr. Josem Kaufmann

## 2013-02-23 ENCOUNTER — Other Ambulatory Visit: Payer: Self-pay | Admitting: *Deleted

## 2013-02-23 DIAGNOSIS — M1990 Unspecified osteoarthritis, unspecified site: Secondary | ICD-10-CM

## 2013-02-24 MED ORDER — HYDROCODONE-ACETAMINOPHEN 5-325 MG PO TABS: 1.0000 | ORAL_TABLET | Freq: Four times a day (QID) | ORAL | Status: DC | PRN

## 2013-02-24 NOTE — Telephone Encounter (Signed)
Hydrocodone 5/325mg  rx called to [facility] also pt informed.

## 2013-02-28 ENCOUNTER — Ambulatory Visit (HOSPITAL_BASED_OUTPATIENT_CLINIC_OR_DEPARTMENT_OTHER): Payer: No Typology Code available for payment source | Attending: Internal Medicine

## 2013-02-28 ENCOUNTER — Encounter: Payer: Self-pay | Admitting: Internal Medicine

## 2013-02-28 VITALS — Ht 67.0 in | Wt 282.0 lb

## 2013-02-28 DIAGNOSIS — G4733 Obstructive sleep apnea (adult) (pediatric): Secondary | ICD-10-CM

## 2013-02-28 DIAGNOSIS — G4710 Hypersomnia, unspecified: Secondary | ICD-10-CM | POA: Insufficient documentation

## 2013-02-28 DIAGNOSIS — R269 Unspecified abnormalities of gait and mobility: Secondary | ICD-10-CM | POA: Insufficient documentation

## 2013-03-05 DIAGNOSIS — G4710 Hypersomnia, unspecified: Secondary | ICD-10-CM

## 2013-03-05 DIAGNOSIS — G4730 Sleep apnea, unspecified: Secondary | ICD-10-CM

## 2013-03-06 NOTE — Procedures (Signed)
Jeremiah Perkins, Jeremiah Perkins             ACCOUNT NO.:  [id number]  MEDICAL RECORD NO.:  [id number]          PATIENT TYPE:  OUT  LOCATION:  SLEEP CENTER                 FACILITY:  [facility]  PHYSICIAN:  Clinton D. Maple Hudson, MD, FCCP, FACPDATE OF BIRTH:  Mar 25, 1959  DATE OF STUDY:  02/28/2013                           NOCTURNAL POLYSOMNOGRAM  REFERRING PHYSICIAN:  Blanch Media, M.D.  INDICATION FOR STUDY:  Hypersomnia with sleep apnea.  EPWORTH SLEEPINESS SCORE:  15/24.  BMI 44.6, weight 285 pounds.  Height 67 inches.  Neck 19 inches.  MEDICATIONS:  Home medications are charted and reviewed.  SLEEP ARCHITECTURE:  A baseline diagnostic NPSG on January 20, 2013 recorded AHI 55.8 per hour.  Body weight was 285 pounds.  CPAP titration is now requested.  RESPIRATORY DATA:  Total sleep time 310.5 minutes with sleep efficiency 84.5%.  Stage I was 6.4%, stage II was 75.5%, stage III absent, REM 18% of total sleep time.  Sleep latency 29.5 minutes, REM latency 68.5 minutes, awake after sleep onset 27.5 minutes.  Arousal index 6.8.  BEDTIME MEDICATION:  Furosemide, Ambien,aspirin, Aspirin, lisinopril, simvastatin, oxycodone.  RESPIRATORY DATA:  CPAP titration protocol.  CPAP titrated to 14 CWP, AHI 4.3 per hour.  He wore a large ResMed Mirage Quattro full-face mask with heated humidifier and an EPR of 3.  OXYGEN DATA:  Snoring was prevented at final CPAP with mean oxygen saturation 95% on room air.  CARDIAC DATA:  Sinus rhythm with PVCs.  MOVEMENT/PARASOMNIA:  Occasional limb jerks, but with little effect on sleep.  No bathroom trips.  IMPRESSION/RECOMMENDATION: 1. Successful CPAP titration to 14 CWP, AHI 4.3 per hour.  He wore a     large ResMed Quattro full-face mask with heated humidifier and an     EPR of 3.  Snoring was prevented and mean oxygen saturation held of     95% on room air. 2. Baseline diagnostic NPSG on January 20, 2013 had recorded AHI 55.8     per hour.  Body weight  was 285 pounds on that study.     Clinton D. Maple Hudson, MD, [facility], FACP Diplomate, American Board of Sleep Medicine    CDY/MEDQ  D:  03/05/2013 10:57:35  T:  03/05/2013 16:10:96  Job:  [id number]

## 2013-03-07 ENCOUNTER — Other Ambulatory Visit: Payer: Self-pay | Admitting: Internal Medicine

## 2013-03-07 DIAGNOSIS — G4733 Obstructive sleep apnea (adult) (pediatric): Secondary | ICD-10-CM

## 2013-03-09 ENCOUNTER — Ambulatory Visit (INDEPENDENT_AMBULATORY_CARE_PROVIDER_SITE_OTHER): Payer: No Typology Code available for payment source | Admitting: Radiation Oncology

## 2013-03-09 ENCOUNTER — Encounter: Payer: Self-pay | Admitting: Radiation Oncology

## 2013-03-09 VITALS — BP 115/70 | HR 63 | Temp 96.8°F | Ht 67.0 in | Wt 282.7 lb

## 2013-03-09 DIAGNOSIS — G4733 Obstructive sleep apnea (adult) (pediatric): Secondary | ICD-10-CM

## 2013-03-09 DIAGNOSIS — M109 Gout, unspecified: Secondary | ICD-10-CM

## 2013-03-09 MED ORDER — DOCUSATE SODIUM 100 MG PO CAPS: 100.0000 mg | ORAL_CAPSULE | Freq: Two times a day (BID) | ORAL | Status: DC

## 2013-03-09 NOTE — Patient Instructions (Addendum)
We will contact you with your laboratory results and will inform you if your dose of allopurinol needs to be increased.   Take docusate as prescribed today for constipation.   Please continue to work towards your weight loss goals as this will by far be the most effective measure to both decrease your number of boils and also to improving your arthritis pain.   Please contact us if you have any new or worsening symptoms.  It was a pleasure to meet you. Have great day.

## 2013-03-09 NOTE — Progress Notes (Signed)
  Subjective:    Patient ID: Jeremiah Perkins, male    DOB: 04/09/1961, 52 y.o.   MRN: [id number]  HPI Patient is a 52 year old man with PMH significant for gout, diabetes mellitus, severe obesity, who presents to clinic today with complaints of boils. The patient states that he has had issues with boils for a number of years, and wants to know what can be done to help reduce the frequency of this issue. He states he only has one boil active currently which is present on the abdomen and is not causing him any significant discomfort.  Regarding his complaints of joint pain, the patient states that this issue is chronic and involves his bilateral hips knees and ankles, and also wishes to know what can be done about this issue as it is significantly limiting his quality of life. He denies any recent gout flares.   Review of Systems  All other systems reviewed and are negative.       Objective:   Physical Exam  Constitutional: He is oriented to person, place, and time. He appears well-developed and well-nourished. No distress.  Obese.   HENT:  Head: Normocephalic and atraumatic.  Eyes: Conjunctivae are normal. Pupils are equal, round, and reactive to light. No scleral icterus.  Neck: Normal range of motion. Neck supple. No tracheal deviation present.  Cardiovascular: Normal rate and regular rhythm.   No murmur heard. Pulmonary/Chest: Effort normal. He has no wheezes. He has no rales.  Abdominal: Bowel sounds are normal. He exhibits no distension. There is no tenderness.  Musculoskeletal: Normal range of motion. He exhibits no edema and no tenderness.  Neurological: He is alert and oriented to person, place, and time. No cranial nerve deficit.  Skin: Skin is warm and dry.  ~1cm furuncle on lower left abdomen.   Psychiatric: He has a normal mood and affect. His behavior is normal.          Assessment & Plan:

## 2013-03-09 NOTE — Assessment & Plan Note (Signed)
Patient counseled extensively on the importance of weight loss and the management of his complaints of recurrent furuncles and also of osteoarthritis associated pain.

## 2013-03-09 NOTE — Progress Notes (Signed)
  agree with note and plans.

## 2013-03-09 NOTE — Assessment & Plan Note (Signed)
Pt was started on allopurinol ~3 weeks ago. Currently taking 100mg  daily. He is also taking colchicine PRN. No recent flares.  - check serum uric acid => inc allopurinol to 200mg  daily if UA >6

## 2013-03-09 NOTE — Assessment & Plan Note (Signed)
Pt recently underwent CPAP titration. He had previously been prescribed CPAP in the past, however he was unable to tolerate it at that time (1990s).  He seems to now understand the importance of CPAP in preventing sequelae associated with OSA, as well as potential improvements in quality of life, and states he will attempt to be compliant with CPAP when restarted.  - will discuss with Dr. Burtis Junes (PCP)

## 2013-03-15 ENCOUNTER — Other Ambulatory Visit: Payer: Self-pay | Admitting: Radiation Oncology

## 2013-03-15 DIAGNOSIS — M109 Gout, unspecified: Secondary | ICD-10-CM

## 2013-03-15 MED ORDER — ALLOPURINOL 100 MG PO TABS: 200.0000 mg | ORAL_TABLET | Freq: Every day | ORAL | Status: DC

## 2013-03-25 ENCOUNTER — Encounter: Payer: Self-pay | Admitting: Internal Medicine

## 2013-03-31 ENCOUNTER — Telehealth: Payer: Self-pay | Admitting: *Deleted

## 2013-03-31 DIAGNOSIS — E118 Type 2 diabetes mellitus with unspecified complications: Secondary | ICD-10-CM

## 2013-03-31 NOTE — Telephone Encounter (Signed)
Fax from [facility] pharmacy - requesting refill on Victoza 18mg /110ml Inject 1.8 mg daily  Qty# 27ml. Last dispensed 11/26/12  Thanks

## 2013-04-01 ENCOUNTER — Other Ambulatory Visit: Payer: Self-pay | Admitting: Internal Medicine

## 2013-04-01 DIAGNOSIS — E118 Type 2 diabetes mellitus with unspecified complications: Secondary | ICD-10-CM

## 2013-04-01 MED ORDER — LIRAGLUTIDE 18 MG/3ML ~~LOC~~ SOPN: 18.0000 mg | PEN_INJECTOR | Freq: Every day | SUBCUTANEOUS | Status: DC

## 2013-04-04 NOTE — Telephone Encounter (Signed)
Victoza rx called to [facility] pharmacy.

## 2013-04-15 ENCOUNTER — Encounter: Payer: Self-pay | Admitting: Internal Medicine

## 2013-04-15 ENCOUNTER — Ambulatory Visit (INDEPENDENT_AMBULATORY_CARE_PROVIDER_SITE_OTHER): Payer: No Typology Code available for payment source | Admitting: Internal Medicine

## 2013-04-15 VITALS — BP 118/64 | HR 70 | Temp 97.0°F | Ht 67.0 in | Wt 274.8 lb

## 2013-04-15 DIAGNOSIS — IMO0002 Reserved for concepts with insufficient information to code with codable children: Secondary | ICD-10-CM

## 2013-04-15 DIAGNOSIS — M129 Arthropathy, unspecified: Secondary | ICD-10-CM

## 2013-04-15 DIAGNOSIS — F332 Major depressive disorder, recurrent severe without psychotic features: Secondary | ICD-10-CM

## 2013-04-15 DIAGNOSIS — E118 Type 2 diabetes mellitus with unspecified complications: Secondary | ICD-10-CM

## 2013-04-15 DIAGNOSIS — M1990 Unspecified osteoarthritis, unspecified site: Secondary | ICD-10-CM

## 2013-04-15 DIAGNOSIS — M5416 Radiculopathy, lumbar region: Secondary | ICD-10-CM

## 2013-04-15 MED ORDER — HYDROCODONE-ACETAMINOPHEN 5-325 MG PO TABS: 1.0000 | ORAL_TABLET | Freq: Four times a day (QID) | ORAL | Status: DC | PRN

## 2013-04-15 NOTE — Progress Notes (Signed)
Subjective:   Patient ID: Jeremiah Perkins male   DOB: [birth year] 52 y.o.   MRN: [id number]  HPI: Jeremiah Perkins is a 52 y.o. African American man with a complicated past medical history of type 2 diabetes, hypertension, congestive heart failure, gout, arthritis, and MDD with previous suicidal attempts and severe OSA that pt wasn't compliant with CPAP presents acutely for some ongoing diffuse pain. Patient is very angry at the beginning of the visit encounters saying that physician and clinic are not addressing his needs properly and that the denial of his application for a handicap sticker was inappropriate and a misinterpretation of this providers understanding of his pain. The patient describes ongoing neuropathic pain along with some ankle pain that is worsened with standing and some chronic low back pain. He had been reached feeding increasing doses of gabapentin from his [facility] but that has not fully relieved his pain. Upon this visit and many previous other visits patient has stated and continues to state that when the patient takes Vicodin the pain is tolerable and he is able to ambulate and function. The patient has had no urinary or stool incontinence the patient has baseline ED dysfunction but has not exacerbated. The patient is still able to ambulate but chooses not to secondary to pain.  The patient also states that he has had poor sleep quality also as he has not applied for the CPAP machine after his sleep study that showed severe sleep apnea. The results of this study were discussed with the patient and the important need of a CPAP machine and patient's compliance in terms of management of his insomnia.   Patient describes anhedonia, increasing or worsening depression feelings, anger towards others including his provider but no direct threats and denied active suicide and homicidal ideation. Patient has poor appetite, psychomotor retardation,  inability to focus, poor sleep, frequent outbursts of anger and crying.  The rest of the interview and examination were not able to be fully completed given the aggressive and angry nature of the patient during this visit.    Past Medical History  Diagnosis Date  . Diabetes mellitus type 2, controlled, with complications DX: 1991    previously on insulin in 2000 for 3-4 years (but was stopped because adamantly did not want to be on insulin)  . Hypertension   . Congestive heart failure     No echo data available. Myocardial perfusion 2005 with EF 61%, presumed diastolic HF   . Diverticulosis     with history of diverticulitis in 10/2011  . Gout   . Arthritis     involving knees, ankles, back, elbows  . Major depression   . Diabetic peripheral neuropathy   . Anxiety   . Psychosexual dysfunction with inhibited sexual excitement   . Hyperlipidemia   . OSA (obstructive sleep apnea) 1998    previously on CPAP - unable to tolerate   Current Outpatient Prescriptions  Medication Sig Dispense Refill  . albuterol (PROVENTIL HFA;VENTOLIN HFA) 108 (90 BASE) MCG/ACT inhaler Inhale 2 puffs into the lungs every 6 (six) hours as needed. For wheezing      . allopurinol (ZYLOPRIM) 100 MG tablet Take 2 tablets (200 mg total) by mouth daily.  60 tablet  3  . aspirin 81 MG tablet Take 81 mg by mouth daily.      . Blood Glucose Monitoring Suppl (AGAMATRIX PRESTO PRO METER) DEVI 1 Device by Does not apply route once.  1  Device  0  . buPROPion (WELLBUTRIN XL) 300 MG 24 hr tablet Take 1 tablet (300 mg total) by mouth daily.  30 tablet  0  . colchicine 0.6 MG tablet Take 0.5-1.2 mg by mouth 2 (two) times daily as needed. Take two pills at onset of gout. Then 1 pill in 1-2 hours then stop.      Marland Kitchen docusate sodium (COLACE) 100 MG capsule Take 1 capsule (100 mg total) by mouth 2 (two) times daily.  60 capsule  1  . furosemide (LASIX) 40 MG tablet Take 1 tablet (40 mg total) by mouth daily.  30 tablet  3  .  glucose blood (AGAMATRIX PRESTO TEST) test strip Use as instructed  100 each  12  . HYDROcodone-acetaminophen (NORCO/VICODIN) 5-325 MG per tablet Take 1 tablet by mouth every 6 (six) hours as needed for pain.  60 tablet  1  . Liraglutide (VICTOZA) 18 MG/3ML SOLN Inject 1.8 mg into the skin daily.      . Liraglutide (VICTOZA) 18 MG/3ML SOPN Inject 18 mg into the skin daily.  3 mL  12  . lisinopril (PRINIVIL,ZESTRIL) 20 MG tablet Take 20 mg by mouth daily.      Marland Kitchen LORazepam (ATIVAN) 0.5 MG tablet Take 0.5 mg by mouth at bedtime.       . metFORMIN (GLUCOPHAGE) 500 MG tablet Take 2,000 mg by mouth every evening.      . Multiple Vitamins-Minerals (MEGA MULTIVITAMIN FOR MEN) TABS Take 1 capsule by mouth every morning.  30 tablet  3  . predniSONE (DELTASONE) 20 MG tablet Take 3 tabs by mouth for 4 days, 2 tabs by mouth for 4 days, 1 tab by mouth for 4 days and follow up in the clinic before stopping the meds.  30 tablet  0  . sildenafil (VIAGRA) 100 MG tablet Take 100 mg by mouth daily as needed. For ED      . simvastatin (ZOCOR) 40 MG tablet Take 40 mg by mouth every evening.       No current facility-administered medications for this visit.   Family History  Problem Relation Age of Onset  . Diabetes Mother   . CAD Mother 70    requiring quadruple bypass  . Hypertension Mother   . Stomach cancer Father   . Lung cancer Father     was a smoker  . Gout Father   . CAD Brother 48    requiring quadruple bypass  . Seizures Brother   . Hypertension Brother   . Diabetes Maternal Grandmother   . Alzheimer's disease Maternal Grandmother   . Alzheimer's disease Mother   . Breast cancer Paternal Aunt   . Diabetes Maternal Aunt    History   Social History  . Marital Status: Married    Spouse Name: N/A    Number of Children: 3  . Years of Education: AA   Occupational History  . Now unemployed     Barrister's clerk at [employer]   Social History Main Topics  . Smoking status: Former Smoker -- 0.10  packs/day for 10 years    Types: Cigarettes  . Smokeless tobacco: Never Used     Comment: Smokes 3 cigs per week / previously smoked 1ppd x 1-2 years  . Alcohol Use: Yes     Comment: 3-4 beers/liquor per week  . Drug Use: No  . Sexually Active: None   Other Topics Concern  . None   Social History Narrative   Previously worked as  Barrister's clerk and lives at home with his wife. They each have children but none between the two of them.   Review of Systems: otherwise negative unless stated in history of present illness  Objective:  Physical Exam: Filed Vitals:   04/15/13 1321  BP: 118/64  Pulse: 70  Temp: 97 F (36.1 C)  TempSrc: Oral  Height: 5\' 7"  (1.702 m)  Weight: 274 lb 12.8 oz (124.648 kg)  SpO2: 97%   Hard to fully perform exam as pt is angry and agitated and denied investigation stating was in pain... General: NAD, morbidly obese Psych: angry, aggressive, unable to be redirected  Assessment & Plan:  1. Lumbar radiculopathy: Patient does have some numbness sensation over left lateral anterior thigh and a positive straight leg test on exam. Patient does have underlying diabetic neuropathy that is also either masking symptoms are exacerbating some. Patient has been receiving increasing doses of gabapentin per [facility] with minimal resolution. Patient does state that when he does have his narcotic pain medication that it will make the pain tolerable. -Refill Vicodin prescription -Referral to sports medicine -application for handicap sticker given temporarily for 6 months until evaluation of radiculopathy and clear etiology can be found  2. MDD, severe and recurring: Patient even with pH q. 9 scoring continues to increase is currently a 6 today has been seeing a counselor/psychiatrist [facility] that is giving him gabapentin but patient does not feel that this is helping depressive mood. Patient continues to express  anhedonia, anger towards himself and others but with no current homicidal or suicidal ideation, decreased appetite, fragmented sleep, and psychomotor retardation. This is most likely exacerbating his interpretations of pain at this time. Given that the patient now has insurance will make a referral to psychiatry.  Patient is very angry and aggressive during this visit refuses to discuss any other medical issues and is passively making threats of disappointment in medical management of problems to myself as the physician throughout this encounter and even with redirection and clear open and honest conversation in terms of rationale behind approach to investigating patient's complaints patient continues to be very angry stating he is very angry and could "hurt someone but won't at this time and doesn't want to." Upon direct questioning patient denied suicide ideation and homicidal ideation. Suspect that also some of this behavior was manipulation for patient's desire to have a handicap sticker as pts aggressive nature improved once the application was completed.  Patient was discussed with Dr. Kem Kays.

## 2013-04-15 NOTE — Patient Instructions (Signed)
Given that you have recently just received insurance and are not happy with your depression treatment at [facility] will be making referral to psychiatry. As it seems that most of your symptoms are correlated with your ongoing severe depression.  In terms of your CPAP machine we have made the referral now they have insurance to receive a machine shortly to treat your severe sleep apnea.  In terms of your ongoing back knee and ankle pain is probably multifactorial as we discussed during our visit those reasons including previously poorly controlled heart failure, diabetes neuropathy, and major depression. We have made a referral to sports medicine for this to be further evaluated.

## 2013-04-19 NOTE — Progress Notes (Signed)
Case discussed with Dr. Burtis Junes immediately after the resident saw the patient. We reviewed the resident's history and exam and pertinent patient test results. I agree with the assessment, diagnosis and plan of care documented in the resident's note. Patient expresses disappointment with current medical care. Denies suicidal or homicidal ideation. Will need referral to psychiatry for MDD.

## 2013-04-20 ENCOUNTER — Telehealth: Payer: Self-pay | Admitting: Licensed Clinical Social Worker

## 2013-04-20 ENCOUNTER — Encounter: Payer: Self-pay | Admitting: Licensed Clinical Social Worker

## 2013-04-20 NOTE — Telephone Encounter (Signed)
Mr.Harth was referred to CSW for psychiatry referral based on pt's newly acquired insurance.  However, CSW unable to find insurance on pt's EMR.  EMR states pt is currently uninsured having the [employer].  EMR states pt is current with [facility] [place].  CSW placed called to pt.  CSW left message requesting return call. CSW provided contact hours and phone number.  CSW will inquire about new insurance, if uninsured recommend pt to sign ROI and discuss current concerns with his current psychiatrist.

## 2013-04-20 NOTE — Telephone Encounter (Signed)
Pt returned call to CSW.  Pt states he has BC/BS and provided copy during his last appt.  However, this is not listed in EMR as of today.  Pt voice complaint of high deductibles and copay with his current insurance.  Pt inquired about his CPAP machine, CSW informed Jeremiah Perkins order is on EMR and has been referred to [facility].  DME order is in [facility] box.  CSW obtained copy of pt's insurance card from front office.  CSW will obtain in-network psychiatrist and provide referral.  Pt aware of $10 copay to see in-network specialist.  Jeremiah Perkins in agreement to sign ROI for [facility] [place].  CSW encouraged Jeremiah Perkins to contact insurance customer service for copay amount on DME/CPAP.  Pt agreeable.

## 2013-04-27 NOTE — Telephone Encounter (Signed)
CSW has yet to received ROI as of this date.  CSW awaiting return of ROI prior to setting add'l psychiatry appt.

## 2013-04-29 ENCOUNTER — Ambulatory Visit: Payer: No Typology Code available for payment source | Admitting: Family Medicine

## 2013-05-02 ENCOUNTER — Encounter: Payer: Self-pay | Admitting: Physical Medicine & Rehabilitation

## 2013-05-02 ENCOUNTER — Ambulatory Visit (INDEPENDENT_AMBULATORY_CARE_PROVIDER_SITE_OTHER): Payer: No Typology Code available for payment source | Admitting: Sports Medicine

## 2013-05-02 ENCOUNTER — Ambulatory Visit (HOSPITAL_COMMUNITY)
Admission: RE | Admit: 2013-05-02 | Discharge: 2013-05-02 | Disposition: A | Discharge status: Home / Self Care | Payer: No Typology Code available for payment source | Source: Ambulatory Visit | Attending: Sports Medicine | Admitting: Sports Medicine

## 2013-05-02 ENCOUNTER — Encounter: Payer: Self-pay | Admitting: Sports Medicine

## 2013-05-02 VITALS — BP 142/78 | HR 68 | Ht 67.0 in | Wt 274.0 lb

## 2013-05-02 DIAGNOSIS — G8929 Other chronic pain: Secondary | ICD-10-CM | POA: Insufficient documentation

## 2013-05-02 DIAGNOSIS — G5791 Unspecified mononeuropathy of right lower limb: Secondary | ICD-10-CM

## 2013-05-02 DIAGNOSIS — M25559 Pain in unspecified hip: Secondary | ICD-10-CM | POA: Insufficient documentation

## 2013-05-02 DIAGNOSIS — G5792 Unspecified mononeuropathy of left lower limb: Secondary | ICD-10-CM

## 2013-05-02 DIAGNOSIS — G5790 Unspecified mononeuropathy of unspecified lower limb: Secondary | ICD-10-CM

## 2013-05-02 DIAGNOSIS — M545 Low back pain, unspecified: Secondary | ICD-10-CM | POA: Insufficient documentation

## 2013-05-02 DIAGNOSIS — M47817 Spondylosis without myelopathy or radiculopathy, lumbosacral region: Secondary | ICD-10-CM | POA: Insufficient documentation

## 2013-05-02 NOTE — Progress Notes (Signed)
Patient ID: Jeremiah Perkins, male   DOB: 06-28-1961, 52 y.o.   MRN: [id number]  S: Pt has multiple MSK complaints today, however is main concern is the contribution of his MSK and medical conditions that are leading to mobility decline. He notes pain in both hips and low back, both knees and both ankles as well as having L anterior leg numbness. Pt also is obese and has CHF previously requiring hospitalization for exacerbations, gout, DM and Diabetic neuropathy.   Today's visit focused on his back and hip pain.  Back pain- this has been ongoing for some time, pt estimates 5 years. He is notes it is bilateral and he feels that is radiates to his knee on the R side and to his ankle on the left side. The pain in the back is noted to be aching, while in the legs it is sharp, burning, pins/needles. Pt at times discusses pain as different with laterality and some times discusses it as the same. Worse with bending forward or laying in bed. Better with Pain meds.  Hip pain- Is bilateral but is in lower back, upper pelvis, occasional burning groin pain on the left. This has been worsening for the past 2 years. Worse with walking, bending, and feels legs lock up when he sits, pain starts with standing for 5 minutes. Nothing improves.   Pt also notes BL knee pain which interferes with stairs. Pt does not use cane, but occasionally requires crutches for ambulation.   O: Spine: Bony tenderness only at L5/S1, negative facet loading test bilaterally (pt experiences only stretch sensation), Negative straight and cross leg signs, with Pt only experiencing stretch sensation in low back. Tender SI joint to palpation L> R.  Reflexes, trace but symmetrical in achilles and patellar bilaterally, Bilateral Quad wasting. No focal strength deficit but patient has B/L quad weakness.  A: Low back pain with associated neuropathic symptoms: given Pt's multiple health problems the differential is broad. Pt has known Neuropathy and is  being treated with Neurontin, which is currently being titrated up. Pt also has obesity and deconditioning related to CHF. However history could support radicular etiology, though testing today did not.  Hip pain- This is not true hip pain and is an extension of the back pain as noted above.   P: Low back pain: Will obtain L spine X rays to evaluate joint space. Will also obtain EMG to differentiate etiology of Neuropathic symptoms given above. Pt to continue Neurontin and follow up with MD who is titrating this as it will likely bring some relief. Pt's most recent titration was 3 days ago so unclear if this dose 300AM and 600PM will be effective.   Deconditioning: Pt notes that despite hospitalizations due to CHF he has not been offered or attended cardio-pulmonary rehab. May consider this.   Weight loss: Pt notes 20lb weight loss since December due to loss of appetite and poor taste from medications. Defer to PCP, however continued, but intentional, weight loss will likely improve all of Pt's LE MSK conditions.

## 2013-05-04 ENCOUNTER — Encounter: Payer: Self-pay | Admitting: Dietician

## 2013-05-04 ENCOUNTER — Telehealth: Payer: Self-pay | Admitting: Sports Medicine

## 2013-05-04 NOTE — Telephone Encounter (Signed)
I spoke with the patient on the phone regarding x-rays of his lumbar spine. X-rays show some mild multilevel degenerative changes. Nothing acute. Patient is scheduled for an EMG/nerve conduction study on July 21st. I've asked him to followup with me after that study so that we can delineate further workup and treatment from that point forward.

## 2013-05-16 NOTE — Telephone Encounter (Signed)
Pt insurance is not loaded in EMR at this time, still showing as uninsured.  CSW has yet to received ROI.  CSW will sign off and send letter to Mr. Welcher.

## 2013-05-20 NOTE — Assessment & Plan Note (Signed)
His weight is down but he still looks fluid overloaded.  We will check his Bmet today and continue the lasix 40 mg BID and follow up in 1 week to see how he is doing.  He was encouraged to continue taking his medications as directed and to watch his weight daily.

## 2013-05-20 NOTE — Assessment & Plan Note (Addendum)
Refill of his chronic pain medication today.  WE will also stop indomethacin today to protect his kidneys

## 2013-05-20 NOTE — Assessment & Plan Note (Signed)
HE has boils in his groin.  WE will start with warm compress therapy and try to keep the area as dry as possible.

## 2013-05-20 NOTE — Assessment & Plan Note (Signed)
Lab Results  Component Value Date   HGBA1C 6.0 11/11/2012   HGBA1C 7.2* 10/27/2011   Lab Results  Component Value Date   MICROALBUR 1.99* 11/11/2012   LDLCALC 95 11/11/2012   CREATININE 1.15 01/10/2013   Last a1c was well controlled and he states that he is doing well.  He denies polyuria and polydipsia.  We will continue his metformin and vyctoza today.

## 2013-06-02 ENCOUNTER — Other Ambulatory Visit: Payer: Self-pay

## 2013-06-08 ENCOUNTER — Other Ambulatory Visit: Payer: Self-pay | Admitting: Internal Medicine

## 2013-06-09 ENCOUNTER — Other Ambulatory Visit: Payer: Self-pay | Admitting: Internal Medicine

## 2013-06-09 MED ORDER — FUROSEMIDE 40 MG PO TABS: 40.0000 mg | ORAL_TABLET | Freq: Two times a day (BID) | ORAL | Status: DC

## 2013-06-09 NOTE — Telephone Encounter (Signed)
That is fine, will change as this is documented in one of Dr. Caleen Jobs notes already.  Thanks.

## 2013-06-09 NOTE — Telephone Encounter (Signed)
Ordered

## 2013-06-13 ENCOUNTER — Encounter: Payer: Self-pay | Admitting: Physical Medicine & Rehabilitation

## 2013-06-13 ENCOUNTER — Ambulatory Visit (HOSPITAL_BASED_OUTPATIENT_CLINIC_OR_DEPARTMENT_OTHER): Payer: No Typology Code available for payment source | Admitting: Physical Medicine & Rehabilitation

## 2013-06-13 ENCOUNTER — Encounter: Payer: No Typology Code available for payment source | Attending: Physical Medicine & Rehabilitation

## 2013-06-13 VITALS — BP 127/72 | HR 122 | Resp 16 | Ht 67.0 in | Wt 275.0 lb

## 2013-06-13 DIAGNOSIS — R209 Unspecified disturbances of skin sensation: Secondary | ICD-10-CM | POA: Insufficient documentation

## 2013-06-13 DIAGNOSIS — E119 Type 2 diabetes mellitus without complications: Secondary | ICD-10-CM | POA: Insufficient documentation

## 2013-06-13 DIAGNOSIS — M79609 Pain in unspecified limb: Secondary | ICD-10-CM

## 2013-06-13 DIAGNOSIS — G4733 Obstructive sleep apnea (adult) (pediatric): Secondary | ICD-10-CM

## 2013-06-13 NOTE — Progress Notes (Signed)
EMG performed 06/13/2013.  See EMG report under media tab.

## 2013-06-13 NOTE — Patient Instructions (Signed)
Dr. Margaretha Sheffield will review EMG results with you.

## 2013-06-17 ENCOUNTER — Telehealth: Payer: Self-pay | Admitting: Sports Medicine

## 2013-06-17 NOTE — Telephone Encounter (Signed)
I spoke with the patient on the phone after reviewing his EMG/nerve conduction studies. He has evidence of sensorimotor polyneuropathy consistent with diabetic neuropathy. No evidence of lumbar radiculopathy. His low back pain is no doubt due to his morbid obesity. I recommended that he followup with his primary care physician for further treatment. Until he loses a significant amount of weight there is not much else I can do for his low back pain. Followup when necessary.

## 2013-06-20 ENCOUNTER — Ambulatory Visit: Payer: No Typology Code available for payment source | Admitting: Sports Medicine

## 2013-07-08 ENCOUNTER — Encounter: Payer: Self-pay | Admitting: Internal Medicine

## 2013-07-08 ENCOUNTER — Inpatient Hospital Stay (HOSPITAL_COMMUNITY): Admission: AD | Admit: 2013-07-08 | Payer: BC Managed Care – PPO | Source: Ambulatory Visit | Admitting: Psychiatry

## 2013-07-08 ENCOUNTER — Ambulatory Visit (INDEPENDENT_AMBULATORY_CARE_PROVIDER_SITE_OTHER): Payer: BC Managed Care – PPO | Admitting: Internal Medicine

## 2013-07-08 VITALS — BP 122/76 | HR 98 | Temp 97.2°F | Ht 67.0 in | Wt 273.1 lb

## 2013-07-08 DIAGNOSIS — I509 Heart failure, unspecified: Secondary | ICD-10-CM

## 2013-07-08 DIAGNOSIS — E118 Type 2 diabetes mellitus with unspecified complications: Secondary | ICD-10-CM

## 2013-07-08 MED ORDER — FUROSEMIDE 20 MG PO TABS
80.0000 mg | ORAL_TABLET | Freq: Once | ORAL | Status: AC
  Administered 2013-07-08: 80 mg via ORAL

## 2013-07-08 NOTE — Progress Notes (Signed)
Subjective:   Patient ID: Jeremiah Perkins male   DOB: 07/16/1961 52 y.o.   MRN: [id number]  HPI: Jeremiah Perkins is a 52 y.o. African American man with a complicated past medical history of type 2 diabetes, hypertension, congestive heart failure, gout, arthritis, and MDD with previous suicidal attempts and severe OSA that pt wasn't compliant with CPAP presents for ongoing SOB. Patient describes his inability to walk small distances such as from the car to a wheelchair without having significant short of breath and substernal chest pressure. Patient has also had to sleep up in a chair and using 3 pillows from his baseline of 2 pillows secondary to the feeling of "water in my lungs/bad wheezes". Patient has been using his inhaler more often anywhere from 3-4 times a day to help with wheezing. Patient has not noticed any weight gain but attributes that to worsening depression and his lack of appetite. Per chart review patient's weight has been stable for the past 3 months. Patient has significant decreased exercise tolerance as well throughout this time accompanying history shortnessOf breath. He has been compliant with his medications.  Patient continues to complain of some bilateral lower summary shooting pain that is helped with gabapentin.EMG reports consistent with diabetes neuropathy and no lumbar radiculopathy in that lower back pain most likely associated with significant morbid obesity.  Lumbar spine XRay showed mild multilevel degenerative disc disease but nothing acute. These results were shared with the patient today during this visit.    Past Medical History  Diagnosis Date  . Diabetes mellitus type 2, controlled, with complications DX: 1991    previously on insulin in 2000 for 3-4 years (but was stopped because adamantly did not want to be on insulin)  . Hypertension   . Congestive heart failure     No echo data available. Myocardial perfusion 2005 with EF 61%, presumed diastolic HF    . Diverticulosis     with history of diverticulitis in 10/2011  . Gout   . Arthritis     involving knees, ankles, back, elbows  . Major depression   . Diabetic peripheral neuropathy   . Anxiety   . Psychosexual dysfunction with inhibited sexual excitement   . Hyperlipidemia   . OSA (obstructive sleep apnea) 1998    previously on CPAP - unable to tolerate   Current Outpatient Prescriptions  Medication Sig Dispense Refill  . albuterol (PROVENTIL HFA;VENTOLIN HFA) 108 (90 BASE) MCG/ACT inhaler Inhale 2 puffs into the lungs every 6 (six) hours as needed. For wheezing      . allopurinol (ZYLOPRIM) 100 MG tablet Take 300 mg by mouth daily.      Marland Kitchen aspirin 81 MG tablet Take 81 mg by mouth daily.      . Blood Glucose Monitoring Suppl (AGAMATRIX PRESTO PRO METER) DEVI 1 Device by Does not apply route once.  1 Device  0  . buPROPion (WELLBUTRIN XL) 300 MG 24 hr tablet Take 1 tablet (300 mg total) by mouth daily.  30 tablet  0  . colchicine 0.6 MG tablet Take 0.5-1.2 mg by mouth 2 (two) times daily as needed. Take two pills at onset of gout. Then 1 pill in 1-2 hours then stop.      Marland Kitchen docusate sodium (COLACE) 100 MG capsule Take 1 capsule (100 mg total) by mouth 2 (two) times daily.  60 capsule  1  . furosemide (LASIX) 40 MG tablet Take 1 tablet (40 mg total) by mouth 2 (two) times daily.  60 tablet  2  . gabapentin (NEURONTIN) 300 MG capsule Take 600 mg by mouth 3 (three) times daily. Take 1 tablet in the morning and 2 tablets at bedtime.      Marland Kitchen glucose blood (AGAMATRIX PRESTO TEST) test strip Use as instructed  100 each  12  . HYDROcodone-acetaminophen (NORCO/VICODIN) 5-325 MG per tablet Take 1 tablet by mouth every 6 (six) hours as needed for pain.  60 tablet  1  . Liraglutide (VICTOZA) 18 MG/3ML SOLN Inject 1.8 mg into the skin daily.      . Liraglutide (VICTOZA) 18 MG/3ML SOPN Inject 18 mg into the skin daily.  3 mL  12  . lisinopril (PRINIVIL,ZESTRIL) 20 MG tablet Take 20 mg by mouth daily.       Marland Kitchen LORazepam (ATIVAN) 0.5 MG tablet Take 0.5 mg by mouth at bedtime.       . metFORMIN (GLUCOPHAGE) 500 MG tablet Take 2,000 mg by mouth every evening.      . Multiple Vitamins-Minerals (MEGA MULTIVITAMIN FOR MEN) TABS Take 1 capsule by mouth every morning.  30 tablet  3  . sildenafil (VIAGRA) 100 MG tablet Take 100 mg by mouth daily as needed. For ED      . simvastatin (ZOCOR) 40 MG tablet Take 40 mg by mouth every evening.       Current Facility-Administered Medications  Medication Dose Route Frequency Provider Last Rate Last Dose  . furosemide (LASIX) tablet 80 mg  80 mg Oral Once Christen Bame, MD       Family History  Problem Relation Age of Onset  . Diabetes Mother   . CAD Mother 66    requiring quadruple bypass  . Hypertension Mother   . Stomach cancer Father   . Lung cancer Father     was a smoker  . Gout Father   . CAD Brother 48    requiring quadruple bypass  . Seizures Brother   . Hypertension Brother   . Diabetes Maternal Grandmother   . Alzheimer's disease Maternal Grandmother   . Alzheimer's disease Mother   . Breast cancer Paternal Aunt   . Diabetes Maternal Aunt    History   Social History  . Marital Status: Married    Spouse Name: N/A    Number of Children: 3  . Years of Education: AA   Occupational History  . Now unemployed     Barrister's clerk at [employer]   Social History Main Topics  . Smoking status: Former Smoker -- 0.10 packs/day for 10 years    Types: Cigarettes  . Smokeless tobacco: Never Used     Comment: Smokes 3 cigs per week / previously smoked 1ppd x 1-2 years  . Alcohol Use: Yes     Comment: 3-4 beers/liquor per week  . Drug Use: No  . Sexual Activity: None   Other Topics Concern  . 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 They each have children but none between the two of them.   Review of Systems: Otherwise negative unless listed in HPI   Objective:  Physical  Exam: Filed Vitals:   07/08/13 1530  BP: 122/76  Pulse: 98  Temp: 97.2 F (36.2 C)  TempSrc: Oral  Height: 5\' 7"  (1.702 m)  Weight: 273 lb 1.6 oz (123.877 kg)  SpO2: 97%   General: sitting in wheelchair, in some moderate distress HEENT: PERRL, EOMI, no scleral icterus Cardiac: tachycardia,  distant heart sounds given body habitus,RR, no rubs, murmurs or gallops Pulm: moving decreased volumes of air, decreased breath sounds bilateral bases, wheezes in upper lobes bilaterally, no rhonchi Abd: soft, nontender, nondistended, BS present Ext: warm and well perfused, minimal +1 bilateral pedal edema Neuro: alert and oriented X3, cranial nerves II-XII grossly intact   Assessment & Plan:  1. SOB: Have a very high suspicion that patient is having acute CHF exacerbation or worsening heart failure given physical exam findings of decreased breath sounds in the bases and upper lobe wheezes patient appears in some slight respiratory distress. Patient complains of increased pillow orthopnea, increased dyspnea on exertion, and audible wheezing as well as increased use of his inhaler. Patient does not have any recent weight gain but has been stable over the past couple months. This is most likely secondary to his worsening depression. -Patient was recommended to be admitted to the hospital for evaluation of his heart given significant change in his symptoms. Patient was adamant that he not want to be admitted has had more pressing obligations. Patient agreed to a reevaluation on 8/18/14or direct evaluation in the emergency room if chest pressure became worse, patient was unable to sleep due to shortness of breath, or patient experienced any strokelike symptoms. Patient agreed and understood that the risk of not being admitted could include MI, stroke, or death. -oral Lasix 80 mg in clinic -on evaluation on 07/11/13 if patient does not improve would highly recommend admission for CHF exacerbation or worsening  CAD  Patient was discussed with Dr. Aundria Rud

## 2013-07-11 ENCOUNTER — Encounter (HOSPITAL_COMMUNITY): Payer: Self-pay | Admitting: *Deleted

## 2013-07-11 ENCOUNTER — Inpatient Hospital Stay (HOSPITAL_COMMUNITY)
Admission: AD | Admit: 2013-07-11 | Discharge: 2013-07-13 | DRG: 127 | Disposition: A | Discharge status: Home / Self Care | Payer: BC Managed Care – PPO | Source: Ambulatory Visit | Attending: Internal Medicine | Admitting: Internal Medicine

## 2013-07-11 ENCOUNTER — Observation Stay (HOSPITAL_COMMUNITY): Payer: BC Managed Care – PPO

## 2013-07-11 ENCOUNTER — Ambulatory Visit (INDEPENDENT_AMBULATORY_CARE_PROVIDER_SITE_OTHER): Payer: BC Managed Care – PPO | Admitting: Internal Medicine

## 2013-07-11 VITALS — BP 113/75 | HR 118 | Temp 97.8°F | Ht 67.0 in | Wt 276.8 lb

## 2013-07-11 DIAGNOSIS — R1909 Other intra-abdominal and pelvic swelling, mass and lump: Secondary | ICD-10-CM | POA: Diagnosis present

## 2013-07-11 DIAGNOSIS — E669 Obesity, unspecified: Secondary | ICD-10-CM | POA: Diagnosis present

## 2013-07-11 DIAGNOSIS — I10 Essential (primary) hypertension: Secondary | ICD-10-CM | POA: Diagnosis present

## 2013-07-11 DIAGNOSIS — M129 Arthropathy, unspecified: Secondary | ICD-10-CM | POA: Diagnosis present

## 2013-07-11 DIAGNOSIS — E119 Type 2 diabetes mellitus without complications: Secondary | ICD-10-CM

## 2013-07-11 DIAGNOSIS — G4733 Obstructive sleep apnea (adult) (pediatric): Secondary | ICD-10-CM

## 2013-07-11 DIAGNOSIS — R0609 Other forms of dyspnea: Secondary | ICD-10-CM

## 2013-07-11 DIAGNOSIS — I4892 Unspecified atrial flutter: Secondary | ICD-10-CM

## 2013-07-11 DIAGNOSIS — Z23 Encounter for immunization: Secondary | ICD-10-CM

## 2013-07-11 DIAGNOSIS — E1142 Type 2 diabetes mellitus with diabetic polyneuropathy: Secondary | ICD-10-CM | POA: Diagnosis present

## 2013-07-11 DIAGNOSIS — F419 Anxiety disorder, unspecified: Secondary | ICD-10-CM

## 2013-07-11 DIAGNOSIS — I5033 Acute on chronic diastolic (congestive) heart failure: Secondary | ICD-10-CM

## 2013-07-11 DIAGNOSIS — I509 Heart failure, unspecified: Secondary | ICD-10-CM | POA: Diagnosis present

## 2013-07-11 DIAGNOSIS — E785 Hyperlipidemia, unspecified: Secondary | ICD-10-CM | POA: Diagnosis present

## 2013-07-11 DIAGNOSIS — Z6841 Body Mass Index (BMI) 40.0 and over, adult: Secondary | ICD-10-CM

## 2013-07-11 DIAGNOSIS — I5022 Chronic systolic (congestive) heart failure: Secondary | ICD-10-CM | POA: Diagnosis present

## 2013-07-11 DIAGNOSIS — F411 Generalized anxiety disorder: Secondary | ICD-10-CM | POA: Diagnosis present

## 2013-07-11 DIAGNOSIS — M109 Gout, unspecified: Secondary | ICD-10-CM | POA: Diagnosis present

## 2013-07-11 DIAGNOSIS — Z79899 Other long term (current) drug therapy: Secondary | ICD-10-CM

## 2013-07-11 DIAGNOSIS — Z87891 Personal history of nicotine dependence: Secondary | ICD-10-CM

## 2013-07-11 DIAGNOSIS — Z7982 Long term (current) use of aspirin: Secondary | ICD-10-CM

## 2013-07-11 DIAGNOSIS — E118 Type 2 diabetes mellitus with unspecified complications: Secondary | ICD-10-CM | POA: Diagnosis present

## 2013-07-11 DIAGNOSIS — R0600 Dyspnea, unspecified: Secondary | ICD-10-CM

## 2013-07-11 DIAGNOSIS — I517 Cardiomegaly: Secondary | ICD-10-CM

## 2013-07-11 DIAGNOSIS — F329 Major depressive disorder, single episode, unspecified: Secondary | ICD-10-CM

## 2013-07-11 DIAGNOSIS — E1149 Type 2 diabetes mellitus with other diabetic neurological complication: Secondary | ICD-10-CM | POA: Diagnosis present

## 2013-07-11 LAB — COMPREHENSIVE METABOLIC PANEL
AST: 25 U/L (ref 0–37)
Albumin: 3.3 g/dL — ABNORMAL LOW (ref 3.5–5.2)
Alkaline Phosphatase: 63 U/L (ref 39–117)
BUN: 16 mg/dL (ref 6–23)
Potassium: 4.5 mEq/L (ref 3.5–5.1)
Total Protein: 7.6 g/dL (ref 6.0–8.3)

## 2013-07-11 LAB — CBC WITH DIFFERENTIAL/PLATELET
Eosinophils Relative: 1 % (ref 0–5)
HCT: 44.6 % (ref 39.0–52.0)
Lymphocytes Relative: 22 % (ref 12–46)
Lymphs Abs: 1.9 10*3/uL (ref 0.7–4.0)
MCV: 90.5 fL (ref 78.0–100.0)
Monocytes Absolute: 0.4 10*3/uL (ref 0.1–1.0)
RBC: 4.93 MIL/uL (ref 4.22–5.81)
WBC: 9 10*3/uL (ref 4.0–10.5)

## 2013-07-11 LAB — GLUCOSE, CAPILLARY: Glucose-Capillary: 109 mg/dL — ABNORMAL HIGH (ref 70–99)

## 2013-07-11 MED ORDER — FUROSEMIDE 10 MG/ML IJ SOLN
40.0000 mg | Freq: Two times a day (BID) | INTRAMUSCULAR | Status: DC
  Administered 2013-07-11 – 2013-07-13 (×3): 40 mg via INTRAVENOUS
  Filled 2013-07-11 (×6): qty 4

## 2013-07-11 MED ORDER — ENOXAPARIN SODIUM 40 MG/0.4ML ~~LOC~~ SOLN
40.0000 mg | SUBCUTANEOUS | Status: DC
  Filled 2013-07-11: qty 0.4

## 2013-07-11 MED ORDER — LORAZEPAM 0.5 MG PO TABS
0.5000 mg | ORAL_TABLET | Freq: Every day | ORAL | Status: DC
  Administered 2013-07-11 – 2013-07-12 (×2): 0.5 mg via ORAL
  Filled 2013-07-11 (×2): qty 1

## 2013-07-11 MED ORDER — HYDROCODONE-ACETAMINOPHEN 5-325 MG PO TABS: 1.0000 | ORAL_TABLET | Freq: Four times a day (QID) | ORAL | Status: DC | PRN

## 2013-07-11 MED ORDER — ALLOPURINOL 300 MG PO TABS
300.0000 mg | ORAL_TABLET | Freq: Every day | ORAL | Status: DC
  Administered 2013-07-12 – 2013-07-13 (×2): 300 mg via ORAL
  Filled 2013-07-11 (×2): qty 1

## 2013-07-11 MED ORDER — ACETAMINOPHEN 325 MG PO TABS: 650.0000 mg | ORAL_TABLET | Freq: Four times a day (QID) | ORAL | Status: DC | PRN

## 2013-07-11 MED ORDER — DILTIAZEM HCL 100 MG IV SOLR
5.0000 mg/h | INTRAVENOUS | Status: DC
  Administered 2013-07-11 – 2013-07-12 (×2): 5 mg/h via INTRAVENOUS
  Filled 2013-07-11: qty 100

## 2013-07-11 MED ORDER — LISINOPRIL 20 MG PO TABS
20.0000 mg | ORAL_TABLET | Freq: Every day | ORAL | Status: DC
  Administered 2013-07-12: 20 mg via ORAL
  Filled 2013-07-11: qty 1

## 2013-07-11 MED ORDER — ASPIRIN 81 MG PO CHEW
81.0000 mg | CHEWABLE_TABLET | Freq: Every day | ORAL | Status: DC
  Administered 2013-07-11 – 2013-07-13 (×3): 81 mg via ORAL
  Filled 2013-07-11 (×3): qty 1

## 2013-07-11 MED ORDER — ATORVASTATIN CALCIUM 20 MG PO TABS
20.0000 mg | ORAL_TABLET | Freq: Every day | ORAL | Status: DC
  Administered 2013-07-12: 20 mg via ORAL
  Filled 2013-07-11 (×2): qty 1

## 2013-07-11 MED ORDER — SIMVASTATIN 40 MG PO TABS
40.0000 mg | ORAL_TABLET | Freq: Every evening | ORAL | Status: DC
  Administered 2013-07-11: 40 mg via ORAL
  Filled 2013-07-11: qty 1

## 2013-07-11 MED ORDER — PNEUMOCOCCAL VAC POLYVALENT 25 MCG/0.5ML IJ INJ
0.5000 mL | INJECTION | INTRAMUSCULAR | Status: AC
  Administered 2013-07-12: 0.5 mL via INTRAMUSCULAR
  Filled 2013-07-11: qty 0.5

## 2013-07-11 MED ORDER — DOCUSATE SODIUM 100 MG PO CAPS
100.0000 mg | ORAL_CAPSULE | Freq: Two times a day (BID) | ORAL | Status: DC
Start: 2013-07-11 — End: 2013-07-13
  Administered 2013-07-11 – 2013-07-13 (×4): 100 mg via ORAL
  Filled 2013-07-11 (×6): qty 1

## 2013-07-11 MED ORDER — BUPROPION HCL ER (XL) 150 MG PO TB24
150.0000 mg | ORAL_TABLET | Freq: Every day | ORAL | Status: DC
  Administered 2013-07-12: 150 mg via ORAL
  Filled 2013-07-11 (×2): qty 1

## 2013-07-11 MED ORDER — SODIUM CHLORIDE 0.9 % IJ SOLN
3.0000 mL | Freq: Two times a day (BID) | INTRAMUSCULAR | Status: DC
  Administered 2013-07-11: 3 mL via INTRAVENOUS

## 2013-07-11 MED ORDER — GABAPENTIN 300 MG PO CAPS
600.0000 mg | ORAL_CAPSULE | Freq: Three times a day (TID) | ORAL | Status: DC
  Administered 2013-07-11 – 2013-07-13 (×6): 600 mg via ORAL
  Filled 2013-07-11 (×8): qty 2

## 2013-07-11 MED ORDER — HEPARIN BOLUS VIA INFUSION
5000.0000 [IU] | Freq: Once | INTRAVENOUS | Status: AC
  Administered 2013-07-12: 5000 [IU] via INTRAVENOUS
  Filled 2013-07-11: qty 5000

## 2013-07-11 MED ORDER — ACETAMINOPHEN 650 MG RE SUPP: 650.0000 mg | Freq: Four times a day (QID) | RECTAL | Status: DC | PRN

## 2013-07-11 MED ORDER — HEPARIN (PORCINE) IN NACL 100-0.45 UNIT/ML-% IJ SOLN
1900.0000 [IU]/h | INTRAMUSCULAR | Status: DC
  Administered 2013-07-12 – 2013-07-13 (×3): 1750 [IU]/h via INTRAVENOUS
  Filled 2013-07-11 (×4): qty 250

## 2013-07-11 NOTE — Assessment & Plan Note (Signed)
Will need encouragement to utilize his CPAP as will help with many of his sxs, weight control, and prevention of R heart failure.

## 2013-07-11 NOTE — Assessment & Plan Note (Signed)
Most of his sxs sound chronic with a slow decline, rather than an acute exacerbation. Will admit to obs to see if there is a reversible component to his sxs. CXR, Pro-BNP, EKG, Trop I. Trial of IV lasix with strict I&O. If has sig increased Pro BNP and pul edema on CXR, repeating the ECHO might be prudent.

## 2013-07-11 NOTE — Progress Notes (Signed)
This is a Psychologist, occupational Note.  The care of the patient was discussed with Dr. Rogelia Boga and the assessment and plan was formulated with their assistance.  Please see their note for official documentation of the patient encounter.   Subjective:   Patient ID: Jeremiah Perkins male   DOB: Aug 26, 1961 52 y.o.   MRN: [id number]  HPI: JeremiahJeremiah Perkins is a 52 y.o. male w/ significant medical history for CHF, diabetes, MDD, anxiety and morbid obesity who returns to clinic for revaluation of chronic shortness of breath and bilateral chest pressure. He was seen in our clinic on Friday August 15th with similar symptoms that have not improved over the weekend and an attempt was made to admit him however he denied admission with the understanding that he would be admitted today if symptoms did not improve. Today he reports that he has had continued chest pressure 4/10 and difficulty breathing since his appointment Friday. He struggles to breath continuously and it is more difficulty when he ambulates. He continues to require 3 pillow orthopnea when sleeping and occasionally sleeps in a recliner. He reports that the lasix helps minimally and it does increase his urination.   The last time [name] felt his breathing was stable was in 2006 at which point he was able to walk up a flight of stairs without shortness of breath. He reports that he has had consistent breathing difficulty for years since that time and that it has gradually declined. He has been admitted to the hospital multiple times over the past few years for pneumonia at which point he was started on an albuterol inhaler prn. He was also admitted for similar symptoms to the above in the past and was administered IV lasix which he reports did improve his symptoms. He has been diagnosed with OSA following a sleep study, but reports he is not using a CPAP machine because he is attempting to have insurance pay for a device.     Past Medical  History  Diagnosis Date   Diabetes mellitus type 2, controlled, with complications DX: 1991    previously on insulin in 2000 for 3-4 years (but was stopped because adamantly did not want to be on insulin)   Hypertension    Congestive heart failure     No echo data available. Myocardial perfusion 2005 with EF 61%, presumed diastolic HF    Diverticulosis     with history of diverticulitis in 10/2011   Gout    Arthritis     involving knees, ankles, back, elbows   Major depression    Diabetic peripheral neuropathy    Anxiety    Psychosexual dysfunction with inhibited sexual excitement    Hyperlipidemia    OSA (obstructive sleep apnea) 1998    previously on CPAP - unable to tolerate   Current Outpatient Prescriptions  Medication Sig Dispense Refill   albuterol (PROVENTIL HFA;VENTOLIN HFA) 108 (90 BASE) MCG/ACT inhaler Inhale 2 puffs into the lungs every 6 (six) hours as needed. For wheezing       allopurinol (ZYLOPRIM) 100 MG tablet Take 300 mg by mouth daily.       aspirin 81 MG tablet Take 81 mg by mouth daily.       Blood Glucose Monitoring Suppl (AGAMATRIX PRESTO PRO METER) DEVI 1 Device by Does not apply route once.  1 Device  0   buPROPion (WELLBUTRIN XL) 300 MG 24 hr tablet Take 1 tablet (300 mg total) by mouth daily.  30  tablet  0   colchicine 0.6 MG tablet Take 0.5-1.2 mg by mouth 2 (two) times daily as needed. Take two pills at onset of gout. Then 1 pill in 1-2 hours then stop.       docusate sodium (COLACE) 100 MG capsule Take 1 capsule (100 mg total) by mouth 2 (two) times daily.  60 capsule  1   furosemide (LASIX) 40 MG tablet Take 1 tablet (40 mg total) by mouth 2 (two) times daily.  60 tablet  2   gabapentin (NEURONTIN) 300 MG capsule Take 600 mg by mouth 3 (three) times daily. Take 1 tablet in the morning and 2 tablets at bedtime.       glucose blood (AGAMATRIX PRESTO TEST) test strip Use as instructed  100 each  12   HYDROcodone-acetaminophen  (NORCO/VICODIN) 5-325 MG per tablet Take 1 tablet by mouth every 6 (six) hours as needed for pain.  60 tablet  1   Liraglutide (VICTOZA) 18 MG/3ML SOLN Inject 1.8 mg into the skin daily.       Liraglutide (VICTOZA) 18 MG/3ML SOPN Inject 18 mg into the skin daily.  3 mL  12   lisinopril (PRINIVIL,ZESTRIL) 20 MG tablet Take 20 mg by mouth daily.       LORazepam (ATIVAN) 0.5 MG tablet Take 0.5 mg by mouth at bedtime.        metFORMIN (GLUCOPHAGE) 500 MG tablet Take 2,000 mg by mouth every evening.       Multiple Vitamins-Minerals (MEGA MULTIVITAMIN FOR MEN) TABS Take 1 capsule by mouth every morning.  30 tablet  3   sildenafil (VIAGRA) 100 MG tablet Take 100 mg by mouth daily as needed. For ED       simvastatin (ZOCOR) 40 MG tablet Take 40 mg by mouth every evening.       No current facility-administered medications for this visit.   Family History  Problem Relation Age of Onset   Diabetes Mother    CAD Mother 84    requiring quadruple bypass   Hypertension Mother    Stomach cancer Father    Lung cancer Father     was a smoker   Gout Father    CAD Brother 60    requiring quadruple bypass   Seizures Brother    Hypertension Brother    Diabetes Maternal Grandmother    Alzheimer's disease Maternal Grandmother    Alzheimer's disease Mother    Breast cancer Paternal Aunt    Diabetes Maternal Aunt    History   Social History   Marital Status: Married    Spouse Name: N/A    Number of Children: 3   Years of Education: AA   Occupational History   Now unemployed     Barrister's clerk at [employer]   Social History Main Topics   Smoking status: Former Smoker -- 0.10 packs/day for 10 years    Types: Cigarettes   Smokeless tobacco: Never Used     Comment: Smokes 3 cigs per week / previously smoked 1ppd x 1-2 years   Alcohol Use: Yes     Comment: 3-4 beers/liquor per week   Drug Use: No   Sexual Activity: None   Other Topics Concern   None   Social  History Narrative   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 They each have children but none between the two of them.   Review of Systems: Constitutional: negative except for loss of appetite.  Eyes:  negative for visual disturbance Ears, nose, mouth, throat, and face: negative for drainage from ears, nose or throat. Denies loss of hearing.  Respiratory: positive for dyspnea on exertion, pleurisy/chest pain and dry cough. Cardiovascular: positive for exertional chest pressure/discomfort and orthopnea Gastrointestinal: negative for constipation, diarrhea, N/V or changes in normal bowel. Genitourinary:negative for changes from normal effect of lasix. Musculoskeletal:positive for back pain and hip pain.  Neurological: positive for headaches Behavioral/Psych: positive for depression Objective:  Physical Exam: Filed Vitals:   07/11/13 0825  BP: 113/75  Pulse: 118  Temp: 97.8 F (36.6 C)  TempSrc: Oral  Height: 5\' 7"  (1.702 m)  Weight: 276 lb 12.8 oz (125.556 kg)  SpO2: 97%   General appearance: alert, cooperative and no distress Head: Normocephalic, without obvious abnormality, atraumatic Eyes: conjunctivae/corneas clear. PERRL, EOM's intact. Fundi benign. Ears: external ears normal. Hearing in tact.  Nose: Nares normal. Septum midline. Mucosa normal. No drainage or sinus tenderness. Throat: lips, mucosa, and tongue normal; teeth and gums normal Neck: no adenopathy, no JVD and supple, symmetrical, trachea midline Lungs: diminished breath sounds bilaterally and wheezes apex - bilateral Heart: increased rate and regular rhythm Abdomen: soft, non-tender; bowel sounds normal; no masses,  no organomegaly Extremities: warm and well perfused. No pitting edema appreciated.  Pulses: bounding and full radial pulses bilaterally.  Neurologic: Grossly normal  Assessment & Plan:  Jeremiah Perkins appears to present with chronic CHF with possible pulmonary congestion  that continues to decline gradually. Differential diagnose include acute exacerbation, however he appears in no acute distress and without acute changes recently. Other possible diagnosis include COPD or possible ischemia. COPD is unlikely the primary etiology but could be contributing to his symptoms. Ischemia is also unlikely based on past cardiac work ups and the lack of acute changes, however it will be considered based on symptoms.    Plan to admit patient for observation and to complete testing for possible reversible etiology of symptoms and disease progression. Intend to complete Pro-BNP to evaluate for fluid overload and the need for IV diuretics. Will require strict I&Os to further assess fluid status. Will complete EKG, troponins and CXR to evaluate pulmonary congestion, pleural effusions and possible ischemia. Spirometry can be complete during his hospital stay to evaluated for COPD and it's contribution to his symptoms.   Jeremiah Perkins reports that he is out of his prescription for Lisinopril and Lorazepam. He will require refilled prescriptions at the time of discharge.

## 2013-07-11 NOTE — Progress Notes (Signed)
ANTICOAGULATION CONSULT NOTE - Initial Consult  Pharmacy Consult for heparin  Indication: atrial fibrillation/flutter  Allergies  Allergen Reactions  . Other     Eggs - nausea/vomiting (did ok with the flu shot, seems to be an issue with the type of preparation of the egg product)  . Sulfa Antibiotics Nausea And Vomiting    Patient Measurements: Height: 5\' 7"  (170.2 cm) Weight: 277 lb (125.646 kg) IBW/kg (Calculated) : 66.1 Heparin Dosing Weight:   Vital Signs: Temp: 100.1 F (37.8 C) (08/18 2039) Temp src: Oral (08/18 2039) BP: 106/84 mmHg (08/18 2039) Pulse Rate: 128 (08/18 2039)  Labs:  Recent Labs  07/11/13 1408 07/11/13 1410 07/11/13 2003  HGB 15.5  --   --   HCT 44.6  --   --   PLT 298  --   --   CREATININE 1.24  --   --   TROPONINI  --  <0.30 <0.30    Estimated Creatinine Clearance: 87.6 ml/min (by C-G formula based on Cr of 1.24).   Medical History: Past Medical History  Diagnosis Date  . Diabetes mellitus type 2, controlled, with complications DX: 1991    previously on insulin in 2000 for 3-4 years (but was stopped because adamantly did not want to be on insulin)  . Hypertension   . Congestive heart failure     No echo data available. Myocardial perfusion 2005 with EF 61%, presumed diastolic HF   . Diverticulosis     with history of diverticulitis in 10/2011  . Gout   . Arthritis     involving knees, ankles, back, elbows  . Major depression   . Diabetic peripheral neuropathy   . Anxiety   . Psychosexual dysfunction with inhibited sexual excitement   . Hyperlipidemia   . OSA (obstructive sleep apnea) 1998    previously on CPAP - unable to tolerate    Medications:  Prescriptions prior to admission  Medication Sig Dispense Refill  . albuterol (PROVENTIL HFA;VENTOLIN HFA) 108 (90 BASE) MCG/ACT inhaler Inhale 2 puffs into the lungs every 6 (six) hours as needed. For wheezing      . allopurinol (ZYLOPRIM) 300 MG tablet Take 300 mg by mouth daily.       Marland Kitchen aspirin 81 MG tablet Take 81 mg by mouth daily.      . colchicine 0.6 MG tablet Take 0.6 mg by mouth daily.      Marland Kitchen docusate sodium (COLACE) 100 MG capsule Take 100 mg by mouth daily as needed for constipation. For constipation      . furosemide (LASIX) 40 MG tablet Take 1 tablet (40 mg total) by mouth 2 (two) times daily.  60 tablet  2  . gabapentin (NEURONTIN) 600 MG tablet Take 600-1,200 mg by mouth 2 (two) times daily. Takes 600mg  in the am & 1200mg  in the pm      . HYDROcodone-acetaminophen (NORCO/VICODIN) 5-325 MG per tablet Take 1 tablet by mouth every 6 (six) hours as needed for pain.  60 tablet  1  . Liraglutide (VICTOZA) 18 MG/3ML SOLN Inject 1.8 mg into the skin daily.      Marland Kitchen LORazepam (ATIVAN) 0.5 MG tablet Take 0.5 mg by mouth at bedtime as needed for anxiety. For anxiety      . metFORMIN (GLUCOPHAGE) 500 MG tablet Take 1,000 mg by mouth 2 (two) times daily with a meal.      . Multiple Vitamins-Minerals (MEGA MULTIVITAMIN FOR MEN) TABS Take 1 capsule by mouth every morning.  30 tablet  3  . Blood Glucose Monitoring Suppl (AGAMATRIX PRESTO PRO METER) DEVI 1 Device by Does not apply route once.  1 Device  0  . glucose blood (AGAMATRIX PRESTO TEST) test strip Use as instructed  100 each  12  . sildenafil (VIAGRA) 100 MG tablet Take 100 mg by mouth daily as needed. For ED        Assessment: Pt with afib/flutter and chest tightness. chads vasc score 3. Heparin for prevention of stroke. lovenox 40mg  ordered for vte ppx was documented as "not Given" for 1500 today so will procedd with full bolus. Goal of Therapy:  Heparin level 0.3-0.7 units/ml Monitor platelets by anticoagulation protocol: Yes   Plan:  Heparin 5000 units x1 then 1750 units/hr check 6 hour HL then daily HL and cbc starting tomorrow.    Janice Coffin 07/11/2013,11:12 PM

## 2013-07-11 NOTE — H&P (Signed)
Date: 07/11/2013  Patient name: Jeremiah Perkins  Medical record number: [id number]  Date of birth: 04/01/1961   I have seen and evaluated Jefm Bryant and discussed their care with the Residency Team. Mr Alleva is a 52 yo who was seen on 8/15 and dx with an acute CHF exacerbation and was recommended to be admitted. Pt didn't want to be admitted that day and now returns with his packed back ready to be admitted.  He "can't catch his breath" mostly with exertion but also at rest. He has substernal chest pressure in his chest that is constant and feels like a band around his chest, under breasts. He also has 3-4 weeks of a cough like wheeze. He had a 3 lb weight gain over the weekend but otherwise, he has been slowing losing weight over the past 6 months. He cont to take Lasix and it is still making him urinate. He also denies lack of appetite.   When asked about the time course of his sxs, he states his breathing has been bad for years. He is unable to pin down a recent worsening of his sxs. He first stated that his breathing has been worse since 2013 bc that was when he had been admitted twice for CAP and had to quit working but then later stated it has been since 2006 that he was last able to walk up stairs without DOE.   He has had falls 2/2 neuropathy in his feet resulting in him dragging his feet. He still drives. He lives with his wife.   Review of Systems  Constitutional: Positive for appetite change and unexpected weight change. Negative for fever, chills and activity change.  HENT: Negative for congestion and rhinorrhea.  Respiratory: Positive for cough, shortness of breath and wheezing. Negative for chest tightness.  Cardiovascular: Positive for chest pain and leg swelling.  Gastrointestinal: Negative for nausea, vomiting, abdominal pain and diarrhea.  Endocrine: Positive for polyuria.  Genitourinary: Negative for difficulty urinating.  Musculoskeletal: Positive for back pain  and gait problem.  Skin: Negative for wound.  Neurological: Positive for weakness.  Psychiatric/Behavioral: Positive for dysphoric mood.   Objective:   Physical Exam  Constitutional: He is oriented to person, place, and time. He appears well-developed and well-nourished. No distress.  HENT:  Head: Normocephalic and atraumatic.  Right Ear: External ear normal.  Left Ear: External ear normal.  Nose: Nose normal.  Mouth/Throat: Oropharynx is clear and moist.  Eyes: Conjunctivae and EOM are normal. Pupils are equal, round, and reactive to light. Right eye exhibits no discharge. Left eye exhibits no discharge. No scleral icterus.  Neck: Normal range of motion. Neck supple. No tracheal deviation present.  Cardiovascular: Regular rhythm and normal heart sounds. Tachycardia present. Exam reveals no gallop and no friction rub.  No murmur heard.  Pulmonary/Chest: Effort normal and breath sounds normal. No respiratory distress. He has no wheezes. He has no rales.  Able to speak in full sentences. Decreased at bases but otherwise clear.  Abdominal: Soft. Bowel sounds are normal.  Musculoskeletal: Normal range of motion. He exhibits edema. He exhibits no tenderness.  Trace edema B LE  Lymphadenopathy:  He has no cervical adenopathy.  Neurological: He is alert and oriented to person, place, and time.  Skin: Skin is warm and dry. No rash noted. He is not diaphoretic. No erythema. No pallor.  Psychiatric: He has a normal mood and affect. His behavior is normal. Judgment and thought content normal.   Lab results:  Results for orders placed during the hospital encounter of 07/11/13 (from the past 24 hour(s))  GLUCOSE, CAPILLARY     Status: Abnormal   Collection Time    07/11/13 11:20 AM      Result Value Range   Glucose-Capillary 109 (*) 70 - 99 mg/dL  COMPREHENSIVE METABOLIC PANEL     Status: Abnormal   Collection Time    07/11/13  2:08 PM      Result Value Range   Sodium 139  135 - 145 mEq/L     Potassium 4.5  3.5 - 5.1 mEq/L   Chloride 103  96 - 112 mEq/L   CO2 26  19 - 32 mEq/L   Glucose, Bld 122 (*) 70 - 99 mg/dL   BUN 16  6 - 23 mg/dL   Creatinine, Ser 2.13  0.50 - 1.35 mg/dL   Calcium 9.3  8.4 - 08.6 mg/dL   Total Protein 7.6  6.0 - 8.3 g/dL   Albumin 3.3 (*) 3.5 - 5.2 g/dL   AST 25  0 - 37 U/L   ALT 18  0 - 53 U/L   Alkaline Phosphatase 63  39 - 117 U/L   Total Bilirubin 1.1  0.3 - 1.2 mg/dL   GFR calc non Af Amer 65 (*) >90 mL/min   GFR calc Af Amer 75 (*) >90 mL/min  CBC WITH DIFFERENTIAL     Status: None   Collection Time    07/11/13  2:08 PM      Result Value Range   WBC 9.0  4.0 - 10.5 K/uL   RBC 4.93  4.22 - 5.81 MIL/uL   Hemoglobin 15.5  13.0 - 17.0 g/dL   HCT 57.8  46.9 - 62.9 %   MCV 90.5  78.0 - 100.0 fL   MCH 31.4  26.0 - 34.0 pg   MCHC 34.8  30.0 - 36.0 g/dL   RDW 52.8  41.3 - 24.4 %   Platelets 298  150 - 400 K/uL   Neutrophils Relative % 73  43 - 77 %   Neutro Abs 6.5  1.7 - 7.7 K/uL   Lymphocytes Relative 22  12 - 46 %   Lymphs Abs 1.9  0.7 - 4.0 K/uL   Monocytes Relative 4  3 - 12 %   Monocytes Absolute 0.4  0.1 - 1.0 K/uL   Eosinophils Relative 1  0 - 5 %   Eosinophils Absolute 0.1  0.0 - 0.7 K/uL   Basophils Relative 0  0 - 1 %   Basophils Absolute 0.0  0.0 - 0.1 K/uL  PRO B NATRIURETIC PEPTIDE     Status: Abnormal   Collection Time    07/11/13  2:10 PM      Result Value Range   Pro B Natriuretic peptide (BNP) 1050.0 (*) 0 - 125 pg/mL  TROPONIN I     Status: None   Collection Time    07/11/13  2:10 PM      Result Value Range   Troponin I <0.30  <0.30 ng/mL  GLUCOSE, CAPILLARY     Status: Abnormal   Collection Time    07/11/13  4:32 PM      Result Value Range   Glucose-Capillary 103 (*) 70 - 99 mg/dL    Imaging results:  X-ray Chest Pa And Lateral   07/11/2013   *RADIOLOGY REPORT*  Clinical Data: Shortness of breath.  Chest pressure and chills.  CHEST - 2 VIEW  Comparison: 12/31/2012  Findings:  Moderate cardiac enlargement is  identified.  Pulmonary vascular congestion is present.  No pleural effusion or edema identified.  No airspace consolidation.  IMPRESSION:  1.  Moderate cardiac enlargement with pulmonary vascular congestion. 2.  No pneumonia.   Original Report Authenticated By: Signa Kell, M.D.    Assessment and Plan: I have seen and evaluated the patient as outlined above. I agree with the formulated Assessment and Plan as detailed in the residents' admission note, with the following changes:   1. Acute on chronic diastolic heart failure - Most of his sxs sound chronic with a slow decline, rather than an acute exacerbation. Will admit to obs to see if there is a reversible component to his sxs. CXR, Pro-BNP, EKG, Trop I. Trial of IV lasix with strict I&O. If has sig increased Pro BNP and pul edema on CXR, repeating the ECHO might be prudent.   2. OSA - Will need encouragement to utilize his CPAP as will help with many of his sxs, weight control, and prevention of R heart failure  3. Dyspnea - He has chronic dyspnea, likely multifactorial. He does have h/o tobacco use and therefore PFT's once stable would be beneficial as if he does have COPD, he will need a better inhaler regimen.  Burns Spain, MD 8/18/20146:19 PM

## 2013-07-11 NOTE — Progress Notes (Signed)
Called at ~9:30pm by nurse because patient now in atrial flutter. Pt complaining of mild fatigue and some chest tightness. Stat EKG shows atrial flutter with variable av block, rate 112. PR on telemetry 110s-120s. Last BP 106/84. Satting 97-100% on RA. Started cardizem drip for rate control. Also started heparin per pharmacy for anticoagulation as CHA2DS2-VASc score=3, placing him in moderate-high category of stroke risk. No obvious contraindication to heparin. Checking TSH. Order for echocardiogram placed. Still cycling troponins. Will continue to monitor.  Vivi Barrack, MD [phone]

## 2013-07-11 NOTE — Progress Notes (Addendum)
Pt. Complained of feeling tired and weak, with some short of breath during exertion and chest tightness. Breath sound bilaterally with fair air movement, diminished at the bases. HR was in the 110-120's, was called due to rythym was converted to flutter per CMD. 12 lead EKG done shows a-flutter. Dr. Claudell Kyle was notified with orders made and carried out. Will cont. to monitor pt.

## 2013-07-11 NOTE — Progress Notes (Signed)
Report was called to [facility] on 3W-patient was transported via wheelchair with NS lock intact to 3W-16.    Angelina Ok, RN 07/11/2013 11:00 AM.

## 2013-07-11 NOTE — H&P (Signed)
Date: 07/11/2013               Patient Name:  Jeremiah Perkins MRN: [id number]  DOB: October 04, 1961 Age / Sex: 52 y.o., male   PCP: Christen Bame, MD         Medical Service: Internal Medicine Teaching Service         Attending Physician: Dr. Jonah Blue, DO    First Contact: Dr. Glendell Docker Pager: [phone]  Second Contact: Dr. Dierdre Searles Pager: [phone]       After Hours (After 5p/  First Contact Pager: [phone]  weekends / holidays): Second Contact Pager: [phone]   Chief Complaint: Shortness of breath  History of Present Illness:  The patient is a 52 yo man, history of diastolic CHF (EF 95-28%), OSA, HTN, DM2, presenting with shortness of breath.  The patient notes shortness of breath, which has gradually worsened since 2006, but is slightly worse over the last 2 weeks.  During this time, he notes increase in PND to twice/night (baseline once/night), and a weight gain of 3 lbs in 3 days, though stable 1-pillow orthopnea (he notes it's a big pillow), stable DOE after walking 30 feet (c/b DM neuropathy), and no LE edema.  The patient has been taking lasix daily, but varies the dose based on "how much fluid" he thinks he has, using anywhere from 40 mg daily (2-3 days/week), to 40 mg BID, to 80 mg once daily (4-5 days/week).  He has been using an albuterol inhaler 3-4 times per day, particularly at night time, with mild, temporary improvement in dyspnea, though with no history of asthma or COPD.  He notes that his diet has been "bad" over the last 2 weeks, with increased salt in his diet.  He has a history of OSA, diagnosed 12/2012, but has not been able to afford CPAP.  The patient was seen in [facility] 8/15, with concerns for CHF exacerbation, and was given lasix 80 mg PO once, but declined admission.  He re-presented to [facility] today for direct admission.  The patient notes no chest pain, cough, sputum production, LE pain, fevers, nausea/vomiting, abd pain, diarrhea/constipation.  Meds: No current  facility-administered medications for this encounter.   Current Outpatient Prescriptions  Medication Sig Dispense Refill  . albuterol (PROVENTIL HFA;VENTOLIN HFA) 108 (90 BASE) MCG/ACT inhaler Inhale 2 puffs into the lungs every 6 (six) hours as needed. For wheezing      . allopurinol (ZYLOPRIM) 100 MG tablet Take 300 mg by mouth daily.      Marland Kitchen aspirin 81 MG tablet Take 81 mg by mouth daily.      . Blood Glucose Monitoring Suppl (AGAMATRIX PRESTO PRO METER) DEVI 1 Device by Does not apply route once.  1 Device  0  . buPROPion (WELLBUTRIN XL) 300 MG 24 hr tablet Take 1 tablet (300 mg total) by mouth daily.  30 tablet  0  . colchicine 0.6 MG tablet Take 0.5-1.2 mg by mouth 2 (two) times daily as needed. Take two pills at onset of gout. Then 1 pill in 1-2 hours then stop.      Marland Kitchen docusate sodium (COLACE) 100 MG capsule Take 1 capsule (100 mg total) by mouth 2 (two) times daily.  60 capsule  1  . furosemide (LASIX) 40 MG tablet Take 1 tablet (40 mg total) by mouth 2 (two) times daily.  60 tablet  2  . gabapentin (NEURONTIN) 300 MG capsule Take 600 mg by mouth 3 (three) times daily. Take 1 tablet  in the morning and 2 tablets at bedtime.      Marland Kitchen glucose blood (AGAMATRIX PRESTO TEST) test strip Use as instructed  100 each  12  . HYDROcodone-acetaminophen (NORCO/VICODIN) 5-325 MG per tablet Take 1 tablet by mouth every 6 (six) hours as needed for pain.  60 tablet  1  . Liraglutide (VICTOZA) 18 MG/3ML SOLN Inject 1.8 mg into the skin daily.      . Liraglutide (VICTOZA) 18 MG/3ML SOPN Inject 18 mg into the skin daily.  3 mL  12  . lisinopril (PRINIVIL,ZESTRIL) 20 MG tablet Take 20 mg by mouth daily.      Marland Kitchen LORazepam (ATIVAN) 0.5 MG tablet Take 0.5 mg by mouth at bedtime.       . metFORMIN (GLUCOPHAGE) 500 MG tablet Take 2,000 mg by mouth every evening.      . Multiple Vitamins-Minerals (MEGA MULTIVITAMIN FOR MEN) TABS Take 1 capsule by mouth every morning.  30 tablet  3  . sildenafil (VIAGRA) 100 MG tablet  Take 100 mg by mouth daily as needed. For ED      . simvastatin (ZOCOR) 40 MG tablet Take 40 mg by mouth every evening.        Allergies: Allergies as of 07/11/2013 - Review Complete 07/11/2013  Allergen Reaction Noted  . Other  11/11/2012  . Sulfa antibiotics Nausea And Vomiting 12/31/2012   Past Medical History  Diagnosis Date  . Diabetes mellitus type 2, controlled, with complications DX: 1991    previously on insulin in 2000 for 3-4 years (but was stopped because adamantly did not want to be on insulin)  . Hypertension   . Congestive heart failure     No echo data available. Myocardial perfusion 2005 with EF 61%, presumed diastolic HF   . Diverticulosis     with history of diverticulitis in 10/2011  . Gout   . Arthritis     involving knees, ankles, back, elbows  . Major depression   . Diabetic peripheral neuropathy   . Anxiety   . Psychosexual dysfunction with inhibited sexual excitement   . Hyperlipidemia   . OSA (obstructive sleep apnea) 1998    previously on CPAP - unable to tolerate   Past Surgical History  Procedure Laterality Date  . Reconstructive to ankle       right - 2/2 trauma sustained after fall  . 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 Cyst removal leg      back of left leg   Family History  Problem Relation Age of Onset  . Diabetes Mother   . CAD Mother 65    requiring quadruple bypass  . Hypertension Mother   . Stomach cancer Father   . Lung cancer Father     was a smoker  . Gout Father   . CAD Brother 48    requiring quadruple bypass  . Seizures Brother   . Hypertension Brother   . Diabetes Maternal Grandmother   . Alzheimer's disease Maternal Grandmother   . Alzheimer's disease Mother   . Breast cancer Paternal Aunt   . Diabetes Maternal Aunt    History   Social History  . Marital Status: Married    Spouse Name: N/A    Number of Children: 3  . Years of Education: AA   Occupational History  . Now unemployed     Systems developer at [employer]   Social History Main Topics  . Smoking status: Former Smoker -- 0.10  packs/day for 10 years    Types: Cigarettes  . Smokeless tobacco: Never Used     Comment: Smokes 3 cigs per week / previously smoked 1ppd x 1-2 years  . Alcohol Use: Yes     Comment: 3-4 beers/liquor per week  . Drug Use: No  . Sexual Activity: Not on file   Other Topics Concern  . 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 They each have children but none between the two of them.    Review of Systems: General: no fevers, chills, changes in appetite Skin: no rash HEENT: no blurry vision, hearing changes, sore throat Pulm: see HPI CV: no chest pain, palpitations Abd: no abdominal pain, nausea/vomiting, diarrhea/constipation GU: no dysuria, hematuria, polyuria Ext: no arthralgias, myalgias Neuro: +chronic bilateral LE neuropathy   Physical Exam: T: 97.8,  BP: 113/75,  HR: 118, O2: 97% on RA General: alert, cooperative, appeared somewhat uncomfortable HEENT: pupils equal round and reactive to light, vision grossly intact, oropharynx clear and non-erythematous  Neck: supple, no lymphadenopathy Lungs: increased work of respiration, no crackles or wheezes heard  Heart: tachycardic, regular rhythm, no murmurs, gallops, or rubs Abdomen: soft, non-tender, non-distended, normal bowel sounds Extremities: no edema Neurologic: alert & oriented X3, cranial nerves II-XII intact, strength grossly intact, sensation intact to light touch  Lab results: Basic Metabolic Panel: No results found for this basename: NA, K, CL, CO2, GLUCOSE, BUN, CREATININE, CALCIUM, MG, PHOS,  in the last 72 hours Liver Function Tests: No results found for this basename: AST, ALT, ALKPHOS, BILITOT, PROT, ALBUMIN,  in the last 72 hours No results found for this basename: LIPASE, AMYLASE,  in the last 72 hours No results found for this basename: AMMONIA,   in the last 72 hours CBC: No results found for this basename: WBC, NEUTROABS, HGB, HCT, MCV, PLT,  in the last 72 hours Cardiac Enzymes: No results found for this basename: CKTOTAL, CKMB, CKMBINDEX, TROPONINI,  in the last 72 hours BNP: No results found for this basename: PROBNP,  in the last 72 hours CBG:  Recent Labs  07/08/13 1551 07/11/13 0839  GLUCAP 99 155*   Hemoglobin A1C:  Recent Labs  07/08/13 1600  HGBA1C 6.1    Imaging results:  No results found.   Assessment & Plan by Problem: The patient is a 52 yo M, history of dCHF, OSA, presenting with shortness of breath.  # Acute on Chronic Diastolic Heart Failure - The patient presents with a 2-week history of increased PND, and weight gain of 3 lbs, concerning for mild acute on chronic dCHF exacerbation.  Etiology may be due to dietary indiscretion (reported by pt) vs inconsistent use of lasix.  Another strong possibility is uncontrolled OSA driving both SOB and CHF.  Unlikely PE (wells score = 1.5, low prob) vs PNA (afebrile). -admit to observation -check CMP, pro-BNP, trop x3 -check CXR, EKG -will give lasix 40 IV BID -will order CPAP for tonight, and consult CM to try to get patient CPAP as outpatient  # DM2 - The patient has a history of DM2, with last A1C = 6.1 (07/08/13).  On home metformin, Victoza. -hold home meds, start SSI while inpatient -continue gabapentin  # OSA - see above. -CPAP tonight  # Gout - chronic, stable -continue allopurinol  Dispo: Disposition is deferred at this time, awaiting improvement of current medical problems. Anticipated discharge in approximately 1 day(s).  The patient does have a current PCP Christen Bame, MD) and does need an [facility] hospital follow-up appointment after discharge.  Signed: Linward Headland, MD 07/11/2013, 10:36 AM

## 2013-07-11 NOTE — Assessment & Plan Note (Signed)
He has chronic dyspnea, likely multifactorial. He does have h/o tobacco use and therefore PFT's once stable would be beneficial as if he does have COPD, he will need a better inhaler regimen.

## 2013-07-11 NOTE — Progress Notes (Signed)
Subjective:    Patient ID: Jeremiah Perkins, male    DOB: Jun 27, 1961, 52 y.o.   MRN: [id number]  Chest Pain  Associated symptoms include back pain, a cough, shortness of breath and weakness. Pertinent negatives include no abdominal pain, fever, nausea or vomiting.  Cough Associated symptoms include chest pain, shortness of breath and wheezing. Pertinent negatives include no chills, fever or rhinorrhea.    Jeremiah Perkins is a 52 yo who was seen on 8/15 and dx with an acute CHF exacerbation and was recommended to be admitted. Pt didn't want to be admitted that day and now returns with his packed back ready to be admitted.   He "can't catch his breath" mostly with exertion but also at rest. He has substernal chest pressure in his chest that is constant and feels like a band around his chest, under breasts. He also has 3-4 weeks of a cough like wheeze. He had a 3 lb weight gain over the weekend but otherwise, he has been slowing losing weight over the past 6 months. He cont to take Lasix and it is still making him urinate. He also denies lack of appetite.   When asked about the time course of his sxs, he states his breathing has been bad for years. He is unable to pin down a recent worsening of his sxs. He first stated that his breathing has been worse since 2013 bc that was when he had been admitted twice for CAP and had to quit working but then later stated it has been since 2006 that he was last able to walk up stairs without DOE.   He has had falls 2/2 neuropathy in his feet resulting in him dragging his feet. He still drives. He lives with his wife.   Review of Systems  Constitutional: Positive for appetite change and unexpected weight change. Negative for fever, chills and activity change.  HENT: Negative for congestion and rhinorrhea.   Respiratory: Positive for cough, shortness of breath and wheezing. Negative for chest tightness.   Cardiovascular: Positive for chest pain and leg swelling.   Gastrointestinal: Negative for nausea, vomiting, abdominal pain and diarrhea.  Endocrine: Positive for polyuria.  Genitourinary: Negative for difficulty urinating.  Musculoskeletal: Positive for back pain and gait problem.  Skin: Negative for wound.  Neurological: Positive for weakness.  Psychiatric/Behavioral: Positive for dysphoric mood.       Objective:   Physical Exam  Constitutional: He is oriented to person, place, and time. He appears well-developed and well-nourished. No distress.  HENT:  Head: Normocephalic and atraumatic.  Right Ear: External ear normal.  Left Ear: External ear normal.  Nose: Nose normal.  Mouth/Throat: Oropharynx is clear and moist.  Eyes: Conjunctivae and EOM are normal. Pupils are equal, round, and reactive to light. Right eye exhibits no discharge. Left eye exhibits no discharge. No scleral icterus.  Neck: Normal range of motion. Neck supple. No tracheal deviation present.  Cardiovascular: Regular rhythm and normal heart sounds.  Tachycardia present.  Exam reveals no gallop and no friction rub.   No murmur heard. Pulmonary/Chest: Effort normal and breath sounds normal. No respiratory distress. He has no wheezes. He has no rales.  Able to speak in full sentences. Decreased at bases but otherwise clear.  Abdominal: Soft. Bowel sounds are normal.  Musculoskeletal: Normal range of motion. He exhibits edema. He exhibits no tenderness.  Trace edema B LE  Lymphadenopathy:    He has no cervical adenopathy.  Neurological: He is alert and oriented  to person, place, and time.  Skin: Skin is warm and dry. No rash noted. He is not diaphoretic. No erythema. No pallor.  Psychiatric: He has a normal mood and affect. His behavior is normal. Judgment and thought content normal.          Assessment & Plan:

## 2013-07-11 NOTE — Progress Notes (Signed)
Patient placed on auto CPAP at this time via full face mask. Tolerating well at this time, RT will continue to monitor.

## 2013-07-12 ENCOUNTER — Inpatient Hospital Stay (HOSPITAL_COMMUNITY): Payer: BC Managed Care – PPO

## 2013-07-12 ENCOUNTER — Ambulatory Visit (HOSPITAL_COMMUNITY): Payer: Self-pay | Admitting: Psychiatry

## 2013-07-12 DIAGNOSIS — I4892 Unspecified atrial flutter: Secondary | ICD-10-CM

## 2013-07-12 DIAGNOSIS — E1142 Type 2 diabetes mellitus with diabetic polyneuropathy: Secondary | ICD-10-CM

## 2013-07-12 DIAGNOSIS — I5033 Acute on chronic diastolic (congestive) heart failure: Principal | ICD-10-CM

## 2013-07-12 DIAGNOSIS — G4733 Obstructive sleep apnea (adult) (pediatric): Secondary | ICD-10-CM

## 2013-07-12 DIAGNOSIS — I517 Cardiomegaly: Secondary | ICD-10-CM

## 2013-07-12 LAB — BASIC METABOLIC PANEL
CO2: 25 mEq/L (ref 19–32)
CO2: 24 mEq/L (ref 19–32)
CO2: 25 mEq/L (ref 19–32)
Calcium: 8.9 mg/dL (ref 8.4–10.5)
Calcium: 8.7 mg/dL (ref 8.4–10.5)
Chloride: 105 mEq/L (ref 96–112)
Chloride: 102 mEq/L (ref 96–112)
Creatinine, Ser: 1.15 mg/dL (ref 0.50–1.35)
GFR calc non Af Amer: 68 mL/min — ABNORMAL LOW (ref >90)
Glucose, Bld: 137 mg/dL — ABNORMAL HIGH (ref 70–99)
Glucose, Bld: 165 mg/dL — ABNORMAL HIGH (ref 70–99)
Potassium: 3.8 mEq/L (ref 3.5–5.1)
Sodium: 139 mEq/L (ref 135–145)
Sodium: 139 mEq/L (ref 135–145)

## 2013-07-12 LAB — CBC WITH DIFFERENTIAL/PLATELET
Eosinophils Relative: 2 % (ref 0–5)
HCT: 42.9 % (ref 39.0–52.0)
Lymphocytes Relative: 34 % (ref 12–46)
Lymphs Abs: 2.7 10*3/uL (ref 0.7–4.0)
MCV: 90.5 fL (ref 78.0–100.0)
Monocytes Absolute: 0.5 10*3/uL (ref 0.1–1.0)
Monocytes Relative: 6 % (ref 3–12)
RBC: 4.74 MIL/uL (ref 4.22–5.81)
WBC: 7.9 10*3/uL (ref 4.0–10.5)

## 2013-07-12 LAB — GLUCOSE, CAPILLARY
Glucose-Capillary: 195 mg/dL — ABNORMAL HIGH (ref 70–99)
Glucose-Capillary: 131 mg/dL — ABNORMAL HIGH (ref 70–99)

## 2013-07-12 LAB — TSH: TSH: 2.338 u[IU]/mL (ref 0.350–4.500)

## 2013-07-12 LAB — HEPARIN LEVEL (UNFRACTIONATED): Heparin Unfractionated: 0.51 IU/mL (ref 0.30–0.70)

## 2013-07-12 LAB — MAGNESIUM: Magnesium: 2.3 mg/dL (ref 1.5–2.5)

## 2013-07-12 MED ORDER — IOHEXOL 300 MG/ML  SOLN
100.0000 mL | Freq: Once | INTRAMUSCULAR | Status: AC | PRN
  Administered 2013-07-12: 100 mL via INTRAVENOUS

## 2013-07-12 MED ORDER — OFF THE BEAT BOOK
Freq: Once | Status: AC
  Administered 2013-07-12: 09:00:00
  Filled 2013-07-12: qty 1

## 2013-07-12 MED ORDER — LISINOPRIL 5 MG PO TABS
5.0000 mg | ORAL_TABLET | Freq: Every day | ORAL | Status: DC
  Filled 2013-07-12: qty 1

## 2013-07-12 MED ORDER — POTASSIUM CHLORIDE CRYS ER 20 MEQ PO TBCR
40.0000 meq | EXTENDED_RELEASE_TABLET | Freq: Once | ORAL | Status: AC
  Administered 2013-07-12: 40 meq via ORAL
  Filled 2013-07-12: qty 2

## 2013-07-12 NOTE — Progress Notes (Signed)
ANTICOAGULATION CONSULT NOTE - Follow Up Consult  Pharmacy Consult for Heparin Indication: new onset aflutter  Allergies  Allergen Reactions  . Other     Eggs - nausea/vomiting (did ok with the flu shot, seems to be an issue with the type of preparation of the egg product)  . Sulfa Antibiotics Nausea And Vomiting    Patient Measurements: Height: 5\' 7"  (170.2 cm) Weight: 277 lb (125.646 kg) IBW/kg (Calculated) : 66.1 Heparin Dosing Weight: ~95kg  Vital Signs: Temp: 97.5 F (36.4 C) (08/19 1219) Temp src: Oral (08/19 1219) BP: 109/58 mmHg (08/19 1219) Pulse Rate: 119 (08/19 1219)  Labs:  Recent Labs  07/11/13 1408 07/11/13 1410 07/11/13 2003 07/12/13 0040 07/12/13 0655 07/12/13 0749 07/12/13 1215  HGB 15.5  --   --   --   --  14.9  --   HCT 44.6  --   --   --   --  42.9  --   PLT 298  --   --   --   --  261  --   HEPARINUNFRC  --   --   --   --  0.51  --  0.40  CREATININE 1.24  --   --   --  1.19 1.15  --   TROPONINI  --  <0.30 <0.30 <0.30  --   --   --     Estimated Creatinine Clearance: 94.5 ml/min (by C-G formula based on Cr of 1.15).  Medications:  Heparin @ 1750 units/hr  Assessment: 51yom continues on heparin for new onset aflutter. Confirmatory heparin level is therapeutic. CBC is stable. No bleeding reported.  Goal of Therapy:  Heparin level 0.3-0.7 units/ml Monitor platelets by anticoagulation protocol: Yes   Plan:  1) Continue heparin at 1750 units/hr 2) Heparin level, CBC in AM 3) Follow up plan for oral anticoagulation  Fredrik Rigger 07/12/2013,1:41 PM

## 2013-07-12 NOTE — Progress Notes (Addendum)
  Date: 07/12/2013  Patient name: Jeremiah Perkins  Medical record number: [id number]  Date of birth: Jun 17, 1961   This patient has been seen and the plan of care was discussed with the house staff. Please see their note for complete details. I concur with their findings with the following additions/corrections:  Feels better today.  Denies CP, SOB.  Hemodynamically stable. He is very well educated regarding how to manage his heart failure (diet, weights, etc).  Consult cards.  Watch his BMP, Mg at least twice daily while on IV diuresis. States he's had a fever lately and is concerned about an infected boil in his groin, will return later today to examine him without our entire team in the room.  Jonah Blue, DO 07/12/2013, 12:47 PM

## 2013-07-12 NOTE — Plan of Care (Signed)
Problem: Phase II Progression Outcomes Goal: Begin discharge teaching Outcome: Completed/Met Date Met:  07/12/13 Given HF packet

## 2013-07-12 NOTE — Consult Note (Signed)
Reason for Consult: Left Inguinal Mass / ?Boil  Referring Physician: Kem Kays, DO  DOUGLASS DUNSHEE is an 52 y.o. male.   HPI:   1 - Chronic Left Inguinal-Perineal Soft Tissue Mass - Pt with 15 year history of waxing/waning soft tissue area that began after cardiac cath cut down and puncture at same area. Lesions changes size from golf ball to orange sized and periodically drains to skin. No prior BX / incision or clinical infections at site. Overall minimal bother and non painful not-interfering with activites on baseline. Pt noted small drainage earlier today and mentioned to primary team during hospitalization for CHF exacerbation.  Today Ed is seen as new consult for above.   Past Medical History  Diagnosis Date  . Diabetes mellitus type 2, controlled, with complications DX: 1991    previously on insulin in 2000 for 3-4 years (but was stopped because adamantly did not want to be on insulin)  . Hypertension   . Congestive heart failure     No echo data available. Myocardial perfusion 2005 with EF 61%, presumed diastolic HF   . Diverticulosis     with history of diverticulitis in 10/2011  . Gout   . Arthritis     involving knees, ankles, back, elbows  . Major depression   . Diabetic peripheral neuropathy   . Anxiety   . Psychosexual dysfunction with inhibited sexual excitement   . Hyperlipidemia   . OSA (obstructive sleep apnea) 1998    previously on CPAP - unable to tolerate    Past Surgical History  Procedure Laterality Date  . Reconstructive to ankle       right - 2/2 trauma sustained after fall  . 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 Cyst removal leg      back of left leg    Family History  Problem Relation Age of Onset  . Diabetes Mother   . CAD Mother 59    requiring quadruple bypass  . Hypertension Mother   . Stomach cancer Father   . Lung cancer Father     was a smoker  . Gout Father   . CAD Brother 48    requiring quadruple bypass  . Seizures Brother    . Hypertension Brother   . Diabetes Maternal Grandmother   . Alzheimer's disease Maternal Grandmother   . Alzheimer's disease Mother   . Breast cancer Paternal Aunt   . Diabetes Maternal Aunt     Social History:  reports that he has quit smoking. His smoking use included Cigarettes. He has a 1 pack-year smoking history. He has never used smokeless tobacco. He reports that  drinks alcohol. He reports that he does not use illicit drugs.  Allergies:  Allergies  Allergen Reactions  . Other     Eggs - nausea/vomiting (did ok with the flu shot, seems to be an issue with the type of preparation of the egg product)  . Sulfa Antibiotics Nausea And Vomiting    Medications: I have reviewed the patient's current medications.  Results for orders placed during the hospital encounter of 07/11/13 (from the past 48 hour(s))  GLUCOSE, CAPILLARY     Status: Abnormal   Collection Time    07/11/13 11:20 AM      Result Value Range   Glucose-Capillary 109 (*) 70 - 99 mg/dL  COMPREHENSIVE METABOLIC PANEL     Status: Abnormal   Collection Time    07/11/13  2:08 PM  Result Value Range   Sodium 139  135 - 145 mEq/L   Potassium 4.5  3.5 - 5.1 mEq/L   Chloride 103  96 - 112 mEq/L   CO2 26  19 - 32 mEq/L   Glucose, Bld 122 (*) 70 - 99 mg/dL   BUN 16  6 - 23 mg/dL   Creatinine, Ser 1.61  0.50 - 1.35 mg/dL   Calcium 9.3  8.4 - 09.6 mg/dL   Total Protein 7.6  6.0 - 8.3 g/dL   Albumin 3.3 (*) 3.5 - 5.2 g/dL   AST 25  0 - 37 U/L   ALT 18  0 - 53 U/L   Alkaline Phosphatase 63  39 - 117 U/L   Total Bilirubin 1.1  0.3 - 1.2 mg/dL   GFR calc non Af Amer 65 (*) >90 mL/min   GFR calc Af Amer 75 (*) >90 mL/min   Comment: (NOTE)     The eGFR has been calculated using the CKD EPI equation.     This calculation has not been validated in all clinical situations.     eGFR's persistently <90 mL/min signify possible Chronic Kidney     Disease.  CBC WITH DIFFERENTIAL     Status: None   Collection Time     07/11/13  2:08 PM      Result Value Range   WBC 9.0  4.0 - 10.5 K/uL   RBC 4.93  4.22 - 5.81 MIL/uL   Hemoglobin 15.5  13.0 - 17.0 g/dL   HCT 04.5  40.9 - 81.1 %   MCV 90.5  78.0 - 100.0 fL   MCH 31.4  26.0 - 34.0 pg   MCHC 34.8  30.0 - 36.0 g/dL   RDW 91.4  78.2 - 95.6 %   Platelets 298  150 - 400 K/uL   Neutrophils Relative % 73  43 - 77 %   Neutro Abs 6.5  1.7 - 7.7 K/uL   Lymphocytes Relative 22  12 - 46 %   Lymphs Abs 1.9  0.7 - 4.0 K/uL   Monocytes Relative 4  3 - 12 %   Monocytes Absolute 0.4  0.1 - 1.0 K/uL   Eosinophils Relative 1  0 - 5 %   Eosinophils Absolute 0.1  0.0 - 0.7 K/uL   Basophils Relative 0  0 - 1 %   Basophils Absolute 0.0  0.0 - 0.1 K/uL  PRO B NATRIURETIC PEPTIDE     Status: Abnormal   Collection Time    07/11/13  2:10 PM      Result Value Range   Pro B Natriuretic peptide (BNP) 1050.0 (*) 0 - 125 pg/mL  TROPONIN I     Status: None   Collection Time    07/11/13  2:10 PM      Result Value Range   Troponin I <0.30  <0.30 ng/mL   Comment:            Due to the release kinetics of cTnI,     a negative result within the first hours     of the onset of symptoms does not rule out     myocardial infarction with certainty.     If myocardial infarction is still suspected,     repeat the test at appropriate intervals.  GLUCOSE, CAPILLARY     Status: Abnormal   Collection Time    07/11/13  4:32 PM      Result Value Range   Glucose-Capillary 103 (*)  70 - 99 mg/dL  TROPONIN I     Status: None   Collection Time    07/11/13  8:03 PM      Result Value Range   Troponin I <0.30  <0.30 ng/mL   Comment:            Due to the release kinetics of cTnI,     a negative result within the first hours     of the onset of symptoms does not rule out     myocardial infarction with certainty.     If myocardial infarction is still suspected,     repeat the test at appropriate intervals.  GLUCOSE, CAPILLARY     Status: Abnormal   Collection Time    07/11/13  8:33 PM       Result Value Range   Glucose-Capillary 154 (*) 70 - 99 mg/dL   Comment 1 Notify RN    TROPONIN I     Status: None   Collection Time    07/12/13 12:40 AM      Result Value Range   Troponin I <0.30  <0.30 ng/mL   Comment:            Due to the release kinetics of cTnI,     a negative result within the first hours     of the onset of symptoms does not rule out     myocardial infarction with certainty.     If myocardial infarction is still suspected,     repeat the test at appropriate intervals.  TSH     Status: None   Collection Time    07/12/13 12:40 AM      Result Value Range   TSH 2.338  0.350 - 4.500 uIU/mL   Comment: Performed at [facility]  BASIC METABOLIC PANEL     Status: Abnormal   Collection Time    07/12/13  6:55 AM      Result Value Range   Sodium 139  135 - 145 mEq/L   Potassium 3.8  3.5 - 5.1 mEq/L   Chloride 105  96 - 112 mEq/L   CO2 25  19 - 32 mEq/L   Glucose, Bld 137 (*) 70 - 99 mg/dL   BUN 17  6 - 23 mg/dL   Creatinine, Ser 4.13  0.50 - 1.35 mg/dL   Calcium 8.9  8.4 - 24.4 mg/dL   GFR calc non Af Amer 68 (*) >90 mL/min   GFR calc Af Amer 79 (*) >90 mL/min   Comment: (NOTE)     The eGFR has been calculated using the CKD EPI equation.     This calculation has not been validated in all clinical situations.     eGFR's persistently <90 mL/min signify possible Chronic Kidney     Disease.  HEPARIN LEVEL (UNFRACTIONATED)     Status: None   Collection Time    07/12/13  6:55 AM      Result Value Range   Heparin Unfractionated 0.51  0.30 - 0.70 IU/mL   Comment:            IF HEPARIN RESULTS ARE BELOW     EXPECTED VALUES, AND PATIENT     DOSAGE HAS BEEN CONFIRMED,     SUGGEST FOLLOW UP TESTING     OF ANTITHROMBIN III LEVELS.  GLUCOSE, CAPILLARY     Status: Abnormal   Collection Time    07/12/13  7:38 AM      Result Value  Range   Glucose-Capillary 134 (*) 70 - 99 mg/dL  CBC WITH DIFFERENTIAL     Status: None   Collection Time    07/12/13   7:49 AM      Result Value Range   WBC 7.9  4.0 - 10.5 K/uL   RBC 4.74  4.22 - 5.81 MIL/uL   Hemoglobin 14.9  13.0 - 17.0 g/dL   HCT 46.9  62.9 - 52.8 %   MCV 90.5  78.0 - 100.0 fL   MCH 31.4  26.0 - 34.0 pg   MCHC 34.7  30.0 - 36.0 g/dL   RDW 41.3  24.4 - 01.0 %   Platelets 261  150 - 400 K/uL   Neutrophils Relative % 57  43 - 77 %   Neutro Abs 4.5  1.7 - 7.7 K/uL   Lymphocytes Relative 34  12 - 46 %   Lymphs Abs 2.7  0.7 - 4.0 K/uL   Monocytes Relative 6  3 - 12 %   Monocytes Absolute 0.5  0.1 - 1.0 K/uL   Eosinophils Relative 2  0 - 5 %   Eosinophils Absolute 0.2  0.0 - 0.7 K/uL   Basophils Relative 1  0 - 1 %   Basophils Absolute 0.1  0.0 - 0.1 K/uL  BASIC METABOLIC PANEL     Status: Abnormal   Collection Time    07/12/13  7:49 AM      Result Value Range   Sodium 139  135 - 145 mEq/L   Potassium 3.5  3.5 - 5.1 mEq/L   Chloride 104  96 - 112 mEq/L   CO2 24  19 - 32 mEq/L   Glucose, Bld 165 (*) 70 - 99 mg/dL   BUN 17  6 - 23 mg/dL   Creatinine, Ser 2.72  0.50 - 1.35 mg/dL   Calcium 8.9  8.4 - 53.6 mg/dL   GFR calc non Af Amer 71 (*) >90 mL/min   GFR calc Af Amer 82 (*) >90 mL/min   Comment: (NOTE)     The eGFR has been calculated using the CKD EPI equation.     This calculation has not been validated in all clinical situations.     eGFR's persistently <90 mL/min signify possible Chronic Kidney     Disease.  GLUCOSE, CAPILLARY     Status: Abnormal   Collection Time    07/12/13 11:29 AM      Result Value Range   Glucose-Capillary 133 (*) 70 - 99 mg/dL  HEPARIN LEVEL (UNFRACTIONATED)     Status: None   Collection Time    07/12/13 12:15 PM      Result Value Range   Heparin Unfractionated 0.40  0.30 - 0.70 IU/mL   Comment:            IF HEPARIN RESULTS ARE BELOW     EXPECTED VALUES, AND PATIENT     DOSAGE HAS BEEN CONFIRMED,     SUGGEST FOLLOW UP TESTING     OF ANTITHROMBIN III LEVELS.  MAGNESIUM     Status: None   Collection Time    07/12/13 12:15 PM       Result Value Range   Magnesium 2.2  1.5 - 2.5 mg/dL  GLUCOSE, CAPILLARY     Status: Abnormal   Collection Time    07/12/13  4:08 PM      Result Value Range   Glucose-Capillary 195 (*) 70 - 99 mg/dL    X-ray Chest Pa And  Lateral   07/11/2013   *RADIOLOGY REPORT*  Clinical Data: Shortness of breath.  Chest pressure and chills.  CHEST - 2 VIEW  Comparison: 12/31/2012  Findings: Moderate cardiac enlargement is identified.  Pulmonary vascular congestion is present.  No pleural effusion or edema identified.  No airspace consolidation.  IMPRESSION:  1.  Moderate cardiac enlargement with pulmonary vascular congestion. 2.  No pneumonia.   Original Report Authenticated By: Signa Kell, M.D.    Review of Systems  Constitutional: Negative.   HENT: Negative.   Eyes: Negative.   Respiratory: Positive for shortness of breath.   Cardiovascular: Positive for leg swelling and PND.  Gastrointestinal: Negative.   Genitourinary: Negative.  Negative for dysuria, urgency, frequency, hematuria and flank pain.  Musculoskeletal: Negative.   Skin: Negative.   Neurological: Negative.   Endo/Heme/Allergies: Negative.   Psychiatric/Behavioral: Negative.    Blood pressure 109/58, pulse 119, temperature 97.5 F (36.4 C), temperature source Oral, resp. rate 22, height 5\' 7"  (1.702 m), weight 125.646 kg (277 lb), SpO2 100.00%. Physical Exam  Constitutional: He is oriented to person, place, and time. He appears well-developed and well-nourished.  Pleasant, obese. Family in room.  HENT:  Head: Normocephalic and atraumatic.  Eyes: EOM are normal. Pupils are equal, round, and reactive to light.  Neck: Normal range of motion. Neck supple.  Cardiovascular: Normal rate and regular rhythm.   Respiratory: Effort normal.  GI: Soft. Bowel sounds are normal.  Genitourinary: Penis normal.  Lef inguinal-perineal mobile soft tissue density about 5cm diameter. No fluctuence / purlulence / calor / dolor with deep palpation.    Musculoskeletal: Normal range of motion.  Neurological: He is alert and oriented to person, place, and time.  Skin: Skin is warm and dry.  Psychiatric: He has a normal mood and affect. His behavior is normal. Judgment and thought content normal.    Assessment/Plan:  1 - Chronic Left Inguinal-Perineal Soft Tissue Mass - DDX includes chronic seroma v. Granuloma v. Abscess v. Vascular malformation v. Other. Stability favors benign etiology. No s/s acute infection of need for operative intervention.  Will obtain CT pelvis with contrast to help verify not vascular malformation or overtly concerning for neoplasm. If not, would favor observation given minimal bother, no functional sequelae in perspective to his other major comorbidity. These findings and plan were communicated to primary team.  2- Call with questions.  Jany Buckwalter 07/12/2013, 5:15 PM

## 2013-07-12 NOTE — Progress Notes (Signed)
pts BP 90/50 manually, HR 80s A-flutter; pt states he does feel a little lightheaded; paged Lucile Crater, told to hold IV lasix and continue diltiazem gtt at 86ml/hr and continue to check on pts BP or any changes; will continue to monitor

## 2013-07-12 NOTE — Progress Notes (Signed)
Reason for Consult:Atrial flutter. Referring Physician: Dr. Kem Kays Cardiologist: None   Jeremiah Perkins is an 52 y.o. male.  HPI: This 52 year old married male with an worsening dyspnea.  He was found to be in atrial flutter variable ventricular response.  The duration of atrial fibrillation is unknown.  His last prior EKG in February 2014 showed normal sinus.  The patient does not have any history of coronary artery disease.  He had a nuclear perfusion study in 2005 which was poor technical quality but did not show any ischemia and his ejection fraction 61%.  The patient had an echocardiogram in December 2012 which demonstrated ejection fraction of 50-55% no definite wall motion abnormalities he had mildly to moderately dilated left atrium and mildly dilated right ventricle.  The patient states that at home he has been on no cardiac medications except Lasix.  The patient has a history of type 2 diabetes mellitus and peripheral neuropathy. The patient does not smoke cigarettes.  He drinks occasional alcohol but not on a regular basis.  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  He has applied for disability but has not received disability status . The patient was unaware of his irregular pulse.  He denies any history of TIA or stroke.    Past Medical History  Diagnosis Date  . Diabetes mellitus type 2, controlled, with complications DX: 1991    previously on insulin in 2000 for 3-4 years (but was stopped because adamantly did not want to be on insulin)  . Hypertension   . Congestive heart failure     No echo data available. Myocardial perfusion 2005 with EF 61%, presumed diastolic HF   . Diverticulosis     with history of diverticulitis in 10/2011  . Gout   . Arthritis     involving knees, ankles, back, elbows  . Major depression   . Diabetic peripheral neuropathy   . Anxiety   . Psychosexual dysfunction with  inhibited sexual excitement   . Hyperlipidemia   . OSA (obstructive sleep apnea) 1998    previously on CPAP - unable to tolerate    Past Surgical History  Procedure Laterality Date  . Reconstructive to ankle       right - 2/2 trauma sustained after fall  . 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 Cyst removal leg      back of left leg    Family History  Problem Relation Age of Onset  . Diabetes Mother   . CAD Mother 3    requiring quadruple bypass  . Hypertension Mother   . Stomach cancer Father   . Lung cancer Father     was a smoker  . Gout Father   . CAD Brother 48    requiring quadruple bypass  . Seizures Brother   . Hypertension Brother   . Diabetes Maternal Grandmother   . Alzheimer's disease Maternal Grandmother   . Alzheimer's disease Mother   . Breast cancer Paternal Aunt   . Diabetes Maternal Aunt     Social History:  reports that he has quit smoking. His smoking use included Cigarettes. He has a 1 pack-year smoking history. He has never used smokeless tobacco. He reports that  drinks alcohol. He reports that he does not use illicit drugs.  Allergies:  Allergies  Allergen Reactions  . Other     Eggs - nausea/vomiting (did ok with the  flu shot, seems to be an issue with the type of preparation of the egg product)  . Sulfa Antibiotics Nausea And Vomiting    Medications:  Scheduled: . allopurinol  300 mg Oral Daily  . aspirin  81 mg Oral Daily  . atorvastatin  20 mg Oral q1800  . buPROPion  150 mg Oral Daily  . docusate sodium  100 mg Oral BID  . furosemide  40 mg Intravenous BID  . gabapentin  600 mg Oral TID  . lisinopril  20 mg Oral Daily  . LORazepam  0.5 mg Oral QHS  . potassium chloride  40 mEq Oral Once  . sodium chloride  3 mL Intravenous Q12H    Results for orders placed during the hospital encounter of 07/11/13 (from the past 48 hour(s))  GLUCOSE, CAPILLARY     Status: Abnormal   Collection Time    07/11/13 11:20 AM      Result Value  Range   Glucose-Capillary 109 (*) 70 - 99 mg/dL  COMPREHENSIVE METABOLIC PANEL     Status: Abnormal   Collection Time    07/11/13  2:08 PM      Result Value Range   Sodium 139  135 - 145 mEq/L   Potassium 4.5  3.5 - 5.1 mEq/L   Chloride 103  96 - 112 mEq/L   CO2 26  19 - 32 mEq/L   Glucose, Bld 122 (*) 70 - 99 mg/dL   BUN 16  6 - 23 mg/dL   Creatinine, Ser 4.09  0.50 - 1.35 mg/dL   Calcium 9.3  8.4 - 81.1 mg/dL   Total Protein 7.6  6.0 - 8.3 g/dL   Albumin 3.3 (*) 3.5 - 5.2 g/dL   AST 25  0 - 37 U/L   ALT 18  0 - 53 U/L   Alkaline Phosphatase 63  39 - 117 U/L   Total Bilirubin 1.1  0.3 - 1.2 mg/dL   GFR calc non Af Amer 65 (*) >90 mL/min   GFR calc Af Amer 75 (*) >90 mL/min   Comment: (NOTE)     The eGFR has been calculated using the CKD EPI equation.     This calculation has not been validated in all clinical situations.     eGFR's persistently <90 mL/min signify possible Chronic Kidney     Disease.  CBC WITH DIFFERENTIAL     Status: None   Collection Time    07/11/13  2:08 PM      Result Value Range   WBC 9.0  4.0 - 10.5 K/uL   RBC 4.93  4.22 - 5.81 MIL/uL   Hemoglobin 15.5  13.0 - 17.0 g/dL   HCT 91.4  78.2 - 95.6 %   MCV 90.5  78.0 - 100.0 fL   MCH 31.4  26.0 - 34.0 pg   MCHC 34.8  30.0 - 36.0 g/dL   RDW 21.3  08.6 - 57.8 %   Platelets 298  150 - 400 K/uL   Neutrophils Relative % 73  43 - 77 %   Neutro Abs 6.5  1.7 - 7.7 K/uL   Lymphocytes Relative 22  12 - 46 %   Lymphs Abs 1.9  0.7 - 4.0 K/uL   Monocytes Relative 4  3 - 12 %   Monocytes Absolute 0.4  0.1 - 1.0 K/uL   Eosinophils Relative 1  0 - 5 %   Eosinophils Absolute 0.1  0.0 - 0.7 K/uL   Basophils Relative 0  0 - 1 %   Basophils Absolute 0.0  0.0 - 0.1 K/uL  PRO B NATRIURETIC PEPTIDE     Status: Abnormal   Collection Time    07/11/13  2:10 PM      Result Value Range   Pro B Natriuretic peptide (BNP) 1050.0 (*) 0 - 125 pg/mL  TROPONIN I     Status: None   Collection Time    07/11/13  2:10 PM       Result Value Range   Troponin I <0.30  <0.30 ng/mL   Comment:            Due to the release kinetics of cTnI,     a negative result within the first hours     of the onset of symptoms does not rule out     myocardial infarction with certainty.     If myocardial infarction is still suspected,     repeat the test at appropriate intervals.  GLUCOSE, CAPILLARY     Status: Abnormal   Collection Time    07/11/13  4:32 PM      Result Value Range   Glucose-Capillary 103 (*) 70 - 99 mg/dL  TROPONIN I     Status: None   Collection Time    07/11/13  8:03 PM      Result Value Range   Troponin I <0.30  <0.30 ng/mL   Comment:            Due to the release kinetics of cTnI,     a negative result within the first hours     of the onset of symptoms does not rule out     myocardial infarction with certainty.     If myocardial infarction is still suspected,     repeat the test at appropriate intervals.  GLUCOSE, CAPILLARY     Status: Abnormal   Collection Time    07/11/13  8:33 PM      Result Value Range   Glucose-Capillary 154 (*) 70 - 99 mg/dL   Comment 1 Notify RN    TROPONIN I     Status: None   Collection Time    07/12/13 12:40 AM      Result Value Range   Troponin I <0.30  <0.30 ng/mL   Comment:            Due to the release kinetics of cTnI,     a negative result within the first hours     of the onset of symptoms does not rule out     myocardial infarction with certainty.     If myocardial infarction is still suspected,     repeat the test at appropriate intervals.  TSH     Status: None   Collection Time    07/12/13 12:40 AM      Result Value Range   TSH 2.338  0.350 - 4.500 uIU/mL   Comment: Performed at [facility]  BASIC METABOLIC PANEL     Status: Abnormal   Collection Time    07/12/13  6:55 AM      Result Value Range   Sodium 139  135 - 145 mEq/L   Potassium 3.8  3.5 - 5.1 mEq/L   Chloride 105  96 - 112 mEq/L   CO2 25  19 - 32 mEq/L   Glucose, Bld 137 (*)  70 - 99 mg/dL   BUN 17  6 - 23 mg/dL   Creatinine, Ser 7.82  0.50 - 1.35 mg/dL   Calcium 8.9  8.4 - 16.1 mg/dL   GFR calc non Af Amer 68 (*) >90 mL/min   GFR calc Af Amer 79 (*) >90 mL/min   Comment: (NOTE)     The eGFR has been calculated using the CKD EPI equation.     This calculation has not been validated in all clinical situations.     eGFR's persistently <90 mL/min signify possible Chronic Kidney     Disease.  HEPARIN LEVEL (UNFRACTIONATED)     Status: None   Collection Time    07/12/13  6:55 AM      Result Value Range   Heparin Unfractionated 0.51  0.30 - 0.70 IU/mL   Comment:            IF HEPARIN RESULTS ARE BELOW     EXPECTED VALUES, AND PATIENT     DOSAGE HAS BEEN CONFIRMED,     SUGGEST FOLLOW UP TESTING     OF ANTITHROMBIN III LEVELS.  GLUCOSE, CAPILLARY     Status: Abnormal   Collection Time    07/12/13  7:38 AM      Result Value Range   Glucose-Capillary 134 (*) 70 - 99 mg/dL  CBC WITH DIFFERENTIAL     Status: None   Collection Time    07/12/13  7:49 AM      Result Value Range   WBC 7.9  4.0 - 10.5 K/uL   RBC 4.74  4.22 - 5.81 MIL/uL   Hemoglobin 14.9  13.0 - 17.0 g/dL   HCT 09.6  04.5 - 40.9 %   MCV 90.5  78.0 - 100.0 fL   MCH 31.4  26.0 - 34.0 pg   MCHC 34.7  30.0 - 36.0 g/dL   RDW 81.1  91.4 - 78.2 %   Platelets 261  150 - 400 K/uL   Neutrophils Relative % 57  43 - 77 %   Neutro Abs 4.5  1.7 - 7.7 K/uL   Lymphocytes Relative 34  12 - 46 %   Lymphs Abs 2.7  0.7 - 4.0 K/uL   Monocytes Relative 6  3 - 12 %   Monocytes Absolute 0.5  0.1 - 1.0 K/uL   Eosinophils Relative 2  0 - 5 %   Eosinophils Absolute 0.2  0.0 - 0.7 K/uL   Basophils Relative 1  0 - 1 %   Basophils Absolute 0.1  0.0 - 0.1 K/uL  BASIC METABOLIC PANEL     Status: Abnormal   Collection Time    07/12/13  7:49 AM      Result Value Range   Sodium 139  135 - 145 mEq/L   Potassium 3.5  3.5 - 5.1 mEq/L   Chloride 104  96 - 112 mEq/L   CO2 24  19 - 32 mEq/L   Glucose, Bld 165 (*) 70 - 99  mg/dL   BUN 17  6 - 23 mg/dL   Creatinine, Ser 9.56  0.50 - 1.35 mg/dL   Calcium 8.9  8.4 - 21.3 mg/dL   GFR calc non Af Amer 71 (*) >90 mL/min   GFR calc Af Amer 82 (*) >90 mL/min   Comment: (NOTE)     The eGFR has been calculated using the CKD EPI equation.     This calculation has not been validated in all clinical situations.     eGFR's persistently <90 mL/min signify possible Chronic Kidney     Disease.  GLUCOSE, CAPILLARY  Status: Abnormal   Collection Time    07/12/13 11:29 AM      Result Value Range   Glucose-Capillary 133 (*) 70 - 99 mg/dL  HEPARIN LEVEL (UNFRACTIONATED)     Status: None   Collection Time    07/12/13 12:15 PM      Result Value Range   Heparin Unfractionated 0.40  0.30 - 0.70 IU/mL   Comment:            IF HEPARIN RESULTS ARE BELOW     EXPECTED VALUES, AND PATIENT     DOSAGE HAS BEEN CONFIRMED,     SUGGEST FOLLOW UP TESTING     OF ANTITHROMBIN III LEVELS.  MAGNESIUM     Status: None   Collection Time    07/12/13 12:15 PM      Result Value Range   Magnesium 2.2  1.5 - 2.5 mg/dL    X-ray Chest Pa And Lateral   07/11/2013   *RADIOLOGY REPORT*  Clinical Data: Shortness of breath.  Chest pressure and chills.  CHEST - 2 VIEW  Comparison: 12/31/2012  Findings: Moderate cardiac enlargement is identified.  Pulmonary vascular congestion is present.  No pleural effusion or edema identified.  No airspace consolidation.  IMPRESSION:  1.  Moderate cardiac enlargement with pulmonary vascular congestion. 2.  No pneumonia.   Original Report Authenticated By: Signa Kell, M.D.   Review of systems negative except as noted in present illness Blood pressure 109/58, pulse 119, temperature 97.5 F (36.4 C), temperature source Oral, resp. rate 22, height 5\' 7"  (1.702 m), weight 277 lb (125.646 kg), SpO2 100.00%. The patient appears to be in no distress.  There is exogenous obesity and the patient is dyspneic walking from the bathroom back to bed. Head and neck exam  reveals that the pupils are equal and reactive.  The extraocular movements are full.  There is no scleral icterus.  Mouth and pharynx are benign.  No lymphadenopathy.  No carotid bruits.  The jugular venous pressure is normal.  Thyroid is not enlarged or tender.  Chest is clear to percussion and auscultation.  No rales or rhonchi.  Expansion of the chest is symmetrical.  Heart reveals no abnormal lift or heave.  First and second heart sounds are normal.  There is no murmur gallop rub or click.  The pulse is irregular  The abdomen is soft obese and nontender.  Bowel sounds are normoactive.  There is no hepatosplenomegaly or mass.  There are no abdominal bruits.  Extremities reveal no phlebitis or edema.  Pedal pulses are good.  There is no cyanosis or clubbing.  Neurologic exam is normal strength and no lateralizing weakness.  No sensory deficits.  Integument reveals no rash EKG shows atrial flutter with variable ventricular response. Two-dimensional echocardiogram has been done, results pending Assessment/Plan: 1. atrial flutter with rapid ventricular response, unknown duration. 2. diastolic congestive heart failure by prior echo 3. obstructive sleep apnea 4. hypertensive cardiovascular disease 5. diabetes mellitus with diabetic peripheral neuropathy 6. history of gout  Recommend: For his atrial flutter the patient adequate anticoagulation.  Depending on affordability, Xarelto might be a good choice for him.  It may be worth having financial counselor explore his options in terms of his insurance coverage.  He has  coverage under his wife's policy.  If he fails to convert on his own back to normal sinus rhythm he would be a candidate for subsequent outpatient cardioversion after he has been on adequate reliable anticoagulation for 4  weeks or more.  Would continue rate control at this point with diltiazem.  We will await results of echocardiogram in terms of updated assessment of left  ventricular function. Will follow with you.  Cassell Clement 07/12/2013, 1:25 PM

## 2013-07-12 NOTE — Progress Notes (Signed)
BP running low this evening. Mild lightheadedness but alert, mentating well. Will hold the IV Lasix that was due this PM. No further antihypertensives due tonight. Continue dilt drip at low dose 5mg /hr for now but watch BP closely. Change AM lisinopril from 20mg  down to 5mg  to allow room for diuresis/rate control meds. Siya Flurry PA-C

## 2013-07-12 NOTE — Progress Notes (Signed)
ANTICOAGULATION CONSULT NOTE - Follow Up Consult  Pharmacy Consult for heparin Indication: atrial fibrillation  Labs:  Recent Labs  07/11/13 1408 07/11/13 1410 07/11/13 2003 07/12/13 0040 07/12/13 0655  HGB 15.5  --   --   --   --   HCT 44.6  --   --   --   --   PLT 298  --   --   --   --   HEPARINUNFRC  --   --   --   --  0.51  CREATININE 1.24  --   --   --   --   TROPONINI  --  <0.30 <0.30 <0.30  --     Assessment/Plan:  51yo male therapeutic on heparin with initial dosing for Afib.  Will continue gtt at current rate and confirm stable with additional level.  Vernard Gambles, PharmD, BCPS  07/12/2013,7:46 AM

## 2013-07-12 NOTE — Progress Notes (Signed)
Called by nurse down in radiology that Jeremiah Perkins is refusing his pelvic CT scan. I spoke with the patient himself on the phone to understand why. He was told by a physician in the past that contrast dye is nephrotoxic and "doesn't want to risk going on dialysis." He apparently has a history of AKI 2/2 NSAID use, which has since resolved. His Cr today has been 1.15-1.33, GFR 69-82. I spent ~5 minutes explaining to him the risks and benefits of the CT with contrast. I explained that in patients with his renal function, contrast dye is safe and only carries a small risk of AKI, which is typically reversible. I also explained that he needs the study with contrast to help [place] understand what sort of disease process is going on in his groin, specifically whether there is an abscess that will need surgical drainage. He acknowledged that he understands the risks of not doing the study and still chooses to refuse it. Will let day team know to consider a reduced-contrast CT study vs. ultrasound as an alternative tomorrow.  Vivi Barrack, MD [phone]

## 2013-07-12 NOTE — Progress Notes (Signed)
*  PRELIMINARY RESULTS* Echocardiogram 2D Echocardiogram has been performed.  Jeremiah Perkins 07/12/2013, 12:00 PM

## 2013-07-12 NOTE — Care Management Note (Addendum)
    Page 1 of 1   07/12/2013     4:25:40 PM   CARE MANAGEMENT NOTE 07/12/2013  Patient:  TOBEN, ACUNA   Account Number:  [id number]  Date Initiated:  07/12/2013  Documentation initiated by:  GRAVES-BIGELOW,Marquise Lambson  Subjective/Objective Assessment:   Pt admitted for SOB. Pt is from home with wife. Pt has had a sleep study 12/2012 and  was unable to pay for cpap machine at the time.     Action/Plan:   CM did call [facility] to see if they could check with the insurance to see how much of a co pay pt will have. CM will await call back form [facility].   Anticipated DC Date:  07/12/2013   Anticipated DC Plan:  HOME/SELF CARE      DC Planning Services  CM consult      Choice offered to / List presented to:             Status of service:  Completed, signed off Medicare Important Message given?   (If response is "NO", the following Medicare IM given date fields will be blank) Date Medicare IM given:   Date Additional Medicare IM given:    Discharge Disposition:  HOME/SELF CARE  Per UR Regulation:  Reviewed for med. necessity/level of care/duration of stay  If discussed at Long Length of Stay Meetings, dates discussed:    Comments:   [address] Tomi Bamberger, RN,BSN [phone] CM did ask Derrian form [facility] to come up and speak to pt in reference to cost for cpap. [facility] willing to work out a payment plan for cpap. Will f/u in am for medication benefits check for xarelto and pt may need assistance with medication due to hardship. Wife is the only one working at this time.  CM did call [facility] to see if pt would be eligible for any disability. [facility] not able to look at pt due to having insurance on file.  1202 07-12-13 CM did get a cost from [facility] for cpap and cost will be 154.00 up front for set up and then 30.00 a month for 7 months. Pt states he and wife are having difficulty paying for cost because she is the only one working. Cm did call Gwinda [place] with [facility] to see if pt could pay at least 50.00 now and be  able to get cpap delivered. VM was left and CM will continue to monitor.

## 2013-07-13 ENCOUNTER — Other Ambulatory Visit: Payer: Self-pay | Admitting: Internal Medicine

## 2013-07-13 DIAGNOSIS — F411 Generalized anxiety disorder: Secondary | ICD-10-CM

## 2013-07-13 DIAGNOSIS — F329 Major depressive disorder, single episode, unspecified: Secondary | ICD-10-CM

## 2013-07-13 LAB — CBC
Hemoglobin: 13.6 g/dL (ref 13.0–17.0)
MCHC: 33.8 g/dL (ref 30.0–36.0)
Platelets: 255 10*3/uL (ref 150–400)
RBC: 4.42 MIL/uL (ref 4.22–5.81)

## 2013-07-13 LAB — BASIC METABOLIC PANEL
CO2: 24 mEq/L (ref 19–32)
Calcium: 8.7 mg/dL (ref 8.4–10.5)
GFR calc Af Amer: 78 mL/min — ABNORMAL LOW (ref >90)
GFR calc non Af Amer: 67 mL/min — ABNORMAL LOW (ref >90)
Sodium: 139 mEq/L (ref 135–145)

## 2013-07-13 LAB — MAGNESIUM: Magnesium: 2.3 mg/dL (ref 1.5–2.5)

## 2013-07-13 LAB — GLUCOSE, CAPILLARY
Glucose-Capillary: 156 mg/dL — ABNORMAL HIGH (ref 70–99)
Glucose-Capillary: 166 mg/dL — ABNORMAL HIGH (ref 70–99)

## 2013-07-13 LAB — HEPARIN LEVEL (UNFRACTIONATED): Heparin Unfractionated: 0.28 IU/mL — ABNORMAL LOW (ref 0.30–0.70)

## 2013-07-13 MED ORDER — ATORVASTATIN CALCIUM 20 MG PO TABS: 20.0000 mg | ORAL_TABLET | Freq: Every day | ORAL | Status: DC

## 2013-07-13 MED ORDER — WARFARIN SODIUM 10 MG PO TABS
10.0000 mg | ORAL_TABLET | Freq: Every day | ORAL | Status: DC
  Filled 2013-07-13: qty 1

## 2013-07-13 MED ORDER — CARVEDILOL 6.25 MG PO TABS
6.2500 mg | ORAL_TABLET | Freq: Two times a day (BID) | ORAL | Status: DC
  Administered 2013-07-13: 6.25 mg via ORAL
  Filled 2013-07-13 (×3): qty 1

## 2013-07-13 MED ORDER — ENOXAPARIN (LOVENOX) PATIENT EDUCATION KIT
PACK | Freq: Once | Status: DC
  Filled 2013-07-13: qty 1

## 2013-07-13 MED ORDER — LISINOPRIL 5 MG PO TABS: 5.0000 mg | ORAL_TABLET | Freq: Every day | ORAL | Status: DC

## 2013-07-13 MED ORDER — WARFARIN - PHARMACIST DOSING INPATIENT: Freq: Every day | Status: DC

## 2013-07-13 MED ORDER — ENOXAPARIN SODIUM 120 MG/0.8ML ~~LOC~~ SOLN
120.0000 mg | Freq: Two times a day (BID) | SUBCUTANEOUS | Status: DC
  Filled 2013-07-13 (×2): qty 0.8

## 2013-07-13 MED ORDER — COUMADIN BOOK
Freq: Once | Status: AC
  Administered 2013-07-13: 12:00:00
  Filled 2013-07-13: qty 1

## 2013-07-13 MED ORDER — CARVEDILOL 6.25 MG PO TABS: 6.2500 mg | ORAL_TABLET | Freq: Two times a day (BID) | ORAL | Status: DC

## 2013-07-13 MED ORDER — WARFARIN SODIUM 5 MG PO TABS: ORAL_TABLET | ORAL | Status: DC

## 2013-07-13 NOTE — Care Management (Addendum)
Case Manager has a benefits check in process for co pay cost for lovenox.   ENOXOPARIN IS COVERED AS GENERIC SPECIALTY, FOR 10 DOSES IS $66.34/ FOR 20 DOSES IS $ 131.38 CM will call [name] on [address] to see if medication is available. Will continue to f/u. Thanks Gala Lewandowsky, RN,BSN [phone]

## 2013-07-13 NOTE — Progress Notes (Signed)
Pt BP 76/40 manually @0900 , pt sitting up and listening to music with no complaints; coreg given earlier this AM and diltiazem gtt now shut off; HR A-flutter 90s; held lisinopril, Lucile Crater paged and made aware, told to recheck BP in 30 minutes and call back with results; pts current BP 92/60 maunally, pt asymptomatic; will continue to monitor, rounding team made aware also

## 2013-07-13 NOTE — Progress Notes (Signed)
D/c orders received; IV removed with gauze on, pt remains ins table condition, pt meds and instructions reviewed and given to pt and pt's wife; review HF and A-fib info with pt; pt d/c to home

## 2013-07-13 NOTE — Progress Notes (Signed)
Subjective:  NAE overnight. Patient wants to go home.  Objective: Vital signs in last 24 hours: Filed Vitals:   07/12/13 1219 07/12/13 1748 07/12/13 2000 07/13/13 0500  BP: 109/58 90/50 96/64  105/63  Pulse: 119 86 72 75  Temp: 97.5 F (36.4 C)  97.6 F (36.4 C) 98.7 F (37.1 C)  TempSrc: Oral  Oral Oral  Resp: 22     Height:      Weight:    274 lb (124.286 kg)  SpO2: 100%  99% 97%   Weight change: -3 lb (-1.361 kg)  Intake/Output Summary (Last 24 hours) at 07/13/13 1610 Last data filed at 07/12/13 2349  Gross per 24 hour  Intake  112.5 ml  Output   1900 ml  Net -1787.5 ml   General: alert, cooperative, appeared somewhat uncomfortable HEENT: pupils equal round and reactive to light, vision grossly intact, oropharynx clear and non-erythematous  Neck: supple, no lymphadenopathy Lungs: normal work of respiration, no crackles or wheezes heard  Heart: tachycardic, regular rhythm, no murmurs, gallops, or rubs Abdomen: soft, non-tender, non-distended, normal bowel sounds  Extremities: no edema Neurologic: alert & oriented X3, cranial nerves II-XII intact, strength grossly intact, sensation intact to light touch  Lab Results: Basic Metabolic Panel:  Recent Labs Lab 07/12/13 2052 07/13/13 0530  NA 139 139  K 4.0 3.9  CL 102 104  CO2 25 24  GLUCOSE 180* 124*  BUN 23 24*  CREATININE 1.33 1.20  CALCIUM 8.7 8.7  MG 2.3 2.3   Liver Function Tests:  Recent Labs Lab 07/11/13 1408  AST 25  ALT 18  ALKPHOS 63  BILITOT 1.1  PROT 7.6  ALBUMIN 3.3*   CBC:  Recent Labs Lab 07/11/13 1408 07/12/13 0749 07/13/13 0530  WBC 9.0 7.9 8.3  NEUTROABS 6.5 4.5  --   HGB 15.5 14.9 13.6  HCT 44.6 42.9 40.2  MCV 90.5 90.5 91.0  PLT 298 261 255   Cardiac Enzymes:  Recent Labs Lab 07/11/13 1410 07/11/13 2003 07/12/13 0040  TROPONINI <0.30 <0.30 <0.30   BNP:  Recent Labs Lab 07/11/13 1410  PROBNP 1050.0*   CBG:  Recent Labs Lab 07/11/13 2033  07/12/13 0738 07/12/13 1129 07/12/13 1608 07/12/13 2003 07/13/13 0745  GLUCAP 154* 134* 133* 195* 131* 156*   Hemoglobin A1C:  Recent Labs Lab 07/08/13 1600  HGBA1C 6.1   Fasting Lipid Panel: No results found for this basename: CHOL, HDL, LDLCALC, TRIG, CHOLHDL, LDLDIRECT,  in the last 168 hours Thyroid Function Tests:  Recent Labs Lab 07/12/13 0040  TSH 2.338    Studies/Results: X-ray Chest Pa And Lateral   07/11/2013   *RADIOLOGY REPORT*  Clinical Data: Shortness of breath.  Chest pressure and chills.  CHEST - 2 VIEW  Comparison: 12/31/2012  Findings: Moderate cardiac enlargement is identified.  Pulmonary vascular congestion is present.  No pleural effusion or edema identified.  No airspace consolidation.  IMPRESSION:  1.  Moderate cardiac enlargement with pulmonary vascular congestion. 2.  No pneumonia.   Original Report Authenticated By: Signa Kell, M.D.   Medications: I have reviewed the patient's current medications. Scheduled Meds: . allopurinol  300 mg Oral Daily  . aspirin  81 mg Oral Daily  . atorvastatin  20 mg Oral q1800  . buPROPion  150 mg Oral Daily  . carvedilol  6.25 mg Oral BID WC  . docusate sodium  100 mg Oral BID  . furosemide  40 mg Intravenous BID  . gabapentin  600 mg Oral  TID  . lisinopril  5 mg Oral Daily  . LORazepam  0.5 mg Oral QHS  . sodium chloride  3 mL Intravenous Q12H   Continuous Infusions: . heparin 1,900 Units/hr (07/13/13 0730)   PRN Meds:.acetaminophen, acetaminophen, HYDROcodone-acetaminophen Assessment/Plan: Principal Problem:   Acute on chronic diastolic heart failure Active Problems:   Hypertension   Diabetes mellitus type 2, controlled, with complications   OSA (obstructive sleep apnea)  The patient is a 52 yo M, history of dCHF, OSA, presenting with shortness of breath.   # Acute on Chronic Systolic Heart Failure c/b Atrial flutter- The patient presents with a 2-week history of increased PND, and weight gain of 3  lbs, concerning for mild acute on chronic dCHF exacerbation. Etiology may be due to dietary indiscretion (reported by pt) vs inconsistent use of lasix. Another strong possibility is uncontrolled OSA driving both SOB and CHF. Unlikely PE (wells score = 1.5, low prob) vs PNA (afebrile).  Troponins were negative x 3. Patient developed Atrial flutter with tachycardia during the first night of admission. He was initially rate controlled with gtt Dilt, however the patient had borderline low blood pressure. TTE revealed a severely reduced EF of 30-35%. Cardiology switched the patient to PO coreg anticipating discharge. - lasix 40 IV BID  -will order CPAP for tonight, and consult CM to try to get patient CPAP as outpatient   Patient is on BB, ACEi; will consider spironolactone as K is normal and EF is less than 35%. As the patient is black hydralazine or another long acting nitro may be added as an outpatient.   The patient will need cardiology f/u for likely ICD placement.  The patients CHADs2 score is 3. Anti-coagulation is indicated.  Patient has elected to initiate coumadin treatment. We will start today. Plan to bridge with sq lovenox.  # Left Groin Mass The patient complained of a draining left sided groin mass on day two of his admission. The mass is well circumscribed and is located at the border of his scrotum and inguinal line. The mass is 5 cm. There is no drainage warmness or erythema. The mass has been present for 15 years since the patient had a catheter placed in his left femoral artery. Urology was consulted, who recommended a CT pelvis with contrast to further characterize the lesion as it could be an abscess, aneurysm, seroma, or soft tissue tumor. The patient refused the CT due to concern of CIN. The night team talked at length with him discussing the risk and benefits of the imaging procedure. The patient understood those risk and elected to not have the study. The patients risk for CIN is  14% and his risk of requiring dialysis is 0.12%. I recommend that the patient f/u with his PCP once he is discharged.  # DM2 - The patient has a history of DM2, with last A1C = 6.1 (07/08/13). On home metformin, Victoza.  -hold home meds, start SSI while inpatient  -continue gabapentin   # OSA - see above.  -CPAP tonight   # Gout - chronic, stable  -continue allopurinol      Dispo: Disposition is deferred at this time, awaiting improvement of current medical problems.  Anticipated discharge in approximately 1 day(s).   The patient does have a current PCP Christen Bame, MD) and does need an [facility] hospital follow-up appointment after discharge.  The patient does not have transportation limitations that hinder transportation to clinic appointments.  .Services Needed at time of discharge: Y =  Yes, Blank = No PT:   OT:   RN:   Equipment:   Other:     LOS: 2 days   [name], MD 07/13/2013, 8:08 AM

## 2013-07-13 NOTE — Care Management (Addendum)
Case Manager did speak to pt this am in reference to cpap. Pt has worked out a deal with [facility] for payment of cpap once released from hospital. Pt also will have a co pay of 70.00 for xarelto and he will not be able to afford medication at this time. Pt has stated that he will use coumadin. Pt will need PT/INR checks set up at the coumadin clinic. No further needs from CM at this time. Gala Lewandowsky, RN,BSN [phone].

## 2013-07-13 NOTE — Progress Notes (Addendum)
ANTICOAGULATION CONSULT NOTE - Follow Up Consult  Pharmacy Consult for Heparin-->Lovenox + Coumadin Indication: new onset aflutter  Allergies  Allergen Reactions  . Other     Eggs - nausea/vomiting (did ok with the flu shot, seems to be an issue with the type of preparation of the egg product)  . Sulfa Antibiotics Nausea And Vomiting    Patient Measurements: Height: 5\' 7"  (170.2 cm) Weight: 274 lb (124.286 kg) IBW/kg (Calculated) : 66.1  Vital Signs: Temp: 98.7 F (37.1 C) (08/20 0500) Temp src: Oral (08/20 0500) BP: 92/60 mmHg (08/20 1040) Pulse Rate: 118 (08/20 0835)  Labs:  Recent Labs  07/11/13 1408 07/11/13 1410 07/11/13 2003 07/12/13 0040 07/12/13 0655 07/12/13 0749 07/12/13 1215 07/12/13 2052 07/13/13 0530  HGB 15.5  --   --   --   --  14.9  --   --  13.6  HCT 44.6  --   --   --   --  42.9  --   --  40.2  PLT 298  --   --   --   --  261  --   --  255  HEPARINUNFRC  --   --   --   --  0.51  --  0.40  --  0.28*  CREATININE 1.24  --   --   --  1.19 1.15  --  1.33 1.20  TROPONINI  --  <0.30 <0.30 <0.30  --   --   --   --   --     Estimated Creatinine Clearance: 90 ml/min (by C-G formula based on Cr of 1.2).  Assessment: 51yom started on heparin for new onset aflutter, now to begin coumadin and transition to lovenox bridge so he can go home. Baseline INR=1.09 (12/2012) and coumadin score = 11. CBC is stable. CrCl of 31ml/min is appropriate for 1mg /kg q12 dosing. Ideal dose would be 125mg  q12 but since largest syringe size is 120mg , will use 120mg  q12 to avoid patient having to use 2 syringes per dose.  Goal of Therapy:  INR 2-3 Anti-Xa 0.6-1.2 4 hours after LMWH given Monitor platelets by anticoagulation protocol: Yes   Plan:  1) Coumadin 10mg  daily  2) Lovenox 120mg  sq q12 - give first dose ~ 1 hour after heparin off 3) Coumadin education  Fredrik Rigger 07/13/2013,11:31 AM

## 2013-07-13 NOTE — Progress Notes (Signed)
  Date: 07/13/2013  Patient name: Jeremiah Perkins  Medical record number: [id number]  Date of birth: 1961-07-17   This patient has been seen and the plan of care was discussed with the house staff. Please see their note for complete details. I concur with their findings with the following additions/corrections:  His SOB is improved. Appreciate cards recs.  Patient prefers coumadin due to cost.  Will be considered for future DCCV.  At this time, discuss with cards the need for bridging with heparin to coumadin (is this necessary?).   EF is reduced, HFrEF (acute on chronic systolic heart failure).  Plans for D/C home today.  Jonah Blue, DO, FACP Faculty [facility] Internal Medicine Residency Program 07/13/2013, 11:49 AM

## 2013-07-13 NOTE — Progress Notes (Signed)
ANTICOAGULATION CONSULT NOTE - Follow Up Consult  Pharmacy Consult for heparin Indication: atrial fibrillation  Labs:  Recent Labs  07/11/13 1408 07/11/13 1410 07/11/13 2003 07/12/13 0040 07/12/13 0655 07/12/13 0749 07/12/13 1215 07/12/13 2052 07/13/13 0530  HGB 15.5  --   --   --   --  14.9  --   --  13.6  HCT 44.6  --   --   --   --  42.9  --   --  40.2  PLT 298  --   --   --   --  261  --   --  255  HEPARINUNFRC  --   --   --   --  0.51  --  0.40  --  0.28*  CREATININE 1.24  --   --   --  1.19 1.15  --  1.33  --   TROPONINI  --  <0.30 <0.30 <0.30  --   --   --   --   --     Assessment: 51yo male now slightly subtherapeutic on heparin after two levels at goal though had been trending down.  Goal of Therapy:  Heparin level 0.3-0.7 units/ml   Plan:  Will increase heparin gtt by 1 unit/kg/hr to 1900 units/hr and check level in 6hr.  Vernard Gambles, PharmD, BCPS  07/13/2013,7:30 AM

## 2013-07-13 NOTE — Discharge Summary (Signed)
Name: Jeremiah Perkins MRN: [id number] DOB: Nov 18, 1961 52 y.o. PCP: Christen Bame, MD  Date of Admission: 07/11/2013 11:05 AM Date of Discharge: 07/15/2013 Attending Physician: No att. providers found  Discharge Diagnosis: 1. Systolic heart failure 2. Atrial Flutter 3. Hypertension 4. Diabetes mellitus type 2, controlled, with complications 5. OSA (obstructive sleep apnea)  Discharge Medications:   Medication List         AGAMATRIX PRESTO PRO METER Devi  1 Device by Does not apply route once.     albuterol 108 (90 BASE) MCG/ACT inhaler  Commonly known as:  PROVENTIL HFA;VENTOLIN HFA  Inhale 2 puffs into the lungs every 6 (six) hours as needed. For wheezing     allopurinol 300 MG tablet  Commonly known as:  ZYLOPRIM  Take 300 mg by mouth daily.     aspirin 81 MG tablet  Take 81 mg by mouth daily.     atorvastatin 20 MG tablet  Commonly known as:  LIPITOR  Take 1 tablet (20 mg total) by mouth daily at 6 PM.     carvedilol 6.25 MG tablet  Commonly known as:  COREG  Take 1 tablet (6.25 mg total) by mouth 2 (two) times daily with a meal.     colchicine 0.6 MG tablet  Take 0.6 mg by mouth daily.     docusate sodium 100 MG capsule  Commonly known as:  COLACE  Take 100 mg by mouth daily as needed for constipation. For constipation     furosemide 40 MG tablet  Commonly known as:  LASIX  Take 1 tablet (40 mg total) by mouth 2 (two) times daily.     gabapentin 600 MG tablet  Commonly known as:  NEURONTIN  Take 600-1,200 mg by mouth 2 (two) times daily. Takes 600mg  in the am & 1200mg  in the pm     glucose blood test strip  Commonly known as:  AGAMATRIX PRESTO TEST  Use as instructed     HYDROcodone-acetaminophen 5-325 MG per tablet  Commonly known as:  NORCO/VICODIN  Take 1 tablet by mouth every 6 (six) hours as needed for pain.     lisinopril 5 MG tablet  Commonly known as:  PRINIVIL,ZESTRIL  Take 1 tablet (5 mg total) by mouth daily.     LORazepam 0.5 MG  tablet  Commonly known as:  ATIVAN  Take 0.5 mg by mouth at bedtime as needed for anxiety. For anxiety     MEGA MULTIVITAMIN FOR MEN Tabs  Take 1 capsule by mouth every morning.     metFORMIN 500 MG tablet  Commonly known as:  GLUCOPHAGE  Take 1,000 mg by mouth 2 (two) times daily with a meal.     sildenafil 100 MG tablet  Commonly known as:  VIAGRA  Take 100 mg by mouth daily as needed. For ED     VICTOZA 18 MG/3ML Soln injection  Generic drug:  Liraglutide  Inject 1.8 mg into the skin daily.     warfarin 5 MG tablet  Commonly known as:  COUMADIN  Take 10 mg daily until meeting with Dr. Alexandria Lodge on Monday.        Disposition and follow-up:   Jeremiah Perkins was discharged from [facility] in Good condition.  At the hospital follow up visit please address:  1.  Systolic CHF, Fluid status, Coumadin, Rate control of Atrial flutter, OSA, HTN, Left groin mass  2.  Labs / imaging needed at time of follow-up: CBC, BMP,  INR  3.  Pending labs/ test needing follow-up: None  Follow-up Appointments: Follow-up Information   Follow up with Dow Adolph, MD On 07/19/2013. (2:45 pm)    Specialty:  Internal Medicine   Contact information:   [address] [place] [address] [phone]       Follow up with Cassell Clement, MD On 08/03/2013. (3:45 pm)    Specialty:  Cardiology   Contact information:   [address] [place] [address] [phone]       Follow up with Gar Ponto, [facility] On 07/19/2013. (2:00 PM)    Specialty:  Pharmacist   Contact information:   [address] [place] [address] [phone]       Discharge Instructions: Discharge Orders   Future Appointments Provider Department Dept Phone   07/19/2013 10:00 AM [facility] [place] [facility] [phone]   07/19/2013 2:45 PM Dow Adolph, MD [place] [facility] [phone]   08/03/2013 3:45 PM Cassell Clement, MD  [facility] Main Office [place]) [phone]   Future Orders Complete By Expires   (HEART FAILURE PATIENTS) Call MD:  Anytime you have any of the following symptoms: 1) 3 pound weight gain in 24 hours or 5 pounds in 1 week 2) shortness of breath, with or without a dry hacking cough 3) swelling in the hands, feet or stomach 4) if you have to sleep on extra pillows at night in order to breathe.  As directed    Call MD for:  extreme fatigue  As directed    Call MD for:  persistant dizziness or light-headedness  As directed    Diet - low sodium heart healthy  As directed    Discharge instructions  As directed    Comments:     You have been diagnosed with severe systolic heart failure and atrial fibrillation. Your medications have been modified in order to treat these conditions. We have scheduled you for follow-up appointments with a cardiologist, a primary care physician, and a pharmacist.   Increase activity slowly  As directed       Consultations: Treatment Team:  Rounding Lbcardiology, MD Sebastian Ache, MD  Procedures Performed:  X-ray Chest Pa And Lateral   07/11/2013   *RADIOLOGY REPORT*  Clinical Data: Shortness of breath.  Chest pressure and chills.  CHEST - 2 VIEW  Comparison: 12/31/2012  Findings: Moderate cardiac enlargement is identified.  Pulmonary vascular congestion is present.  No pleural effusion or edema identified.  No airspace consolidation.  IMPRESSION:  1.  Moderate cardiac enlargement with pulmonary vascular congestion. 2.  No pneumonia.   Original Report Authenticated By: Signa Kell, M.D.    2D Echo: - Left ventricle: The cavity size was normal. Wall thickness was increased in a pattern of moderate LVH. Systolic function was moderately to severely reduced. The estimated ejection fraction was in the range of 30% to 35%. Diffuse hypokinesis. Left atrium: The atrium was moderately dilated.   Admission HPI: The patient is a 52 yo man, history of diastolic CHF (EF  50-55%), OSA, HTN, DM2, presenting with shortness of breath. The patient notes shortness of breath, which has gradually worsened since 2006, but is slightly worse over the last 2 weeks. During this time, he notes increase in PND to twice/night (baseline once/night), and a weight gain of 3 lbs in 3 days, though stable 1-pillow orthopnea (he notes it's a big pillow), stable DOE after walking 30 feet (c/b DM neuropathy), and  no LE edema. The patient has been taking lasix daily, but varies the dose based on "how much fluid" he thinks he has, using anywhere from 40 mg daily (2-3 days/week), to 40 mg BID, to 80 mg once daily (4-5 days/week). He has been using an albuterol inhaler 3-4 times per day, particularly at night time, with mild, temporary improvement in dyspnea, though with no history of asthma or COPD. He notes that his diet has been "bad" over the last 2 weeks, with increased salt in his diet. He has a history of OSA, diagnosed 12/2012, but has not been able to afford CPAP. The patient was seen in [facility] 8/15, with concerns for CHF exacerbation, and was given lasix 80 mg PO once, but declined admission. He re-presented to [facility] today for direct admission. The patient notes no chest pain, cough, sputum production, LE pain, fevers, nausea/vomiting, abd pain, diarrhea/constipation.  Hospital Course by problem list: Principal Problem:   Systolic heart failure Active Problems:   Hypertension   Diabetes mellitus type 2, controlled, with complications   OSA (obstructive sleep apnea)   Atrial flutter   1. Acute on Chronic Systolic Heart Failure c/b Atrial flutter and Obstructive sleep apnea-  The patient presented with a 2-week history of increased PND, and weight gain of 3 lbs, concerning for mild acute on chronic dCHF exacerbation. Etiology may be due to dietary indiscretion (reported by pt) vs inconsistent use of lasix. Another strong possibility is uncontrolled OSA driving both SOB and CHF. Unlikely PE (wells  score = 1.5, low prob) vs PNA (afebrile).  Troponins were negative x 3. Patient developed Atrial flutter with tachycardia during the first night of admission. He was initially rate controlled with gtt Dilt, however the patient had borderline low blood pressure. TTE revealed a severely reduced EF of 30-35%. Cardiology switched the patient to PO coreg anticipating discharge. The patient was diuresed well with BID IV lasix 40 mg. The patient was compliant with CPAP in the hospital and would like to get one for home. Case manager worked with patient to find a way for him to purchase one.  Of note, patient is on BB, ACEi. As outpatient, may consider spironolactone as K is normal and EF is less than 35%. As the patient is black hydralazine or another long acting nitro may be added as an outpatient. The patient will need cardiology f/u for consideration of ICD placement.  The patients CHADs2 score is 3. Anti-coagulation is indicated. Patient has elected to initiate coumadin treatment. We will start today and follow up with Dr. Alexandria Lodge on 07-19-13.  2. Left Groin Mass  The patient complained of a draining left sided groin mass on day two of his admission. The mass is well circumscribed and is located at the border of his scrotum and inguinal line. The mass is 5 cm. There is no drainage warmness or erythema. The mass has been present for 15 years since the patient had a catheter placed in his left femoral artery. Urology was consulted, who recommended a CT pelvis with contrast to further characterize the lesion as it could be an abscess, aneurysm, seroma, or soft tissue tumor. The patient refused the CT due to concern of CIN. The night team talked at length with him discussing the risk and benefits of the imaging procedure. The patient understood those risk and elected to not have the study. The patients risk for CIN is 14% and his risk of requiring dialysis is 0.12%. I recommend that the patient f/u  with his PCP once he  is discharged.  3. DM2  The patient has a history of DM2, with last A1C = 6.1 (07/08/13). On home metformin, Victoza. We held home meds, and started SSI while inpatient. The patient was discharged on his home medications.  4. Gout - chronic, stable  -continued allopurinol, dishcarged on allopurinol    Discharge Vitals:   BP 93/62  Pulse 102  Temp(Src) 97.9 F (36.6 C) (Oral)  Resp 20  Ht 5\' 7"  (1.702 m)  Wt 274 lb (124.286 kg)  BMI 42.9 kg/m2  SpO2 99%  Signed: [name], MD 07/15/2013, 4:22 PM   Time Spent on Discharge: 30 minutes Services Ordered on Discharge: None Equipment Ordered on Discharge: None

## 2013-07-13 NOTE — Progress Notes (Signed)
Subjective:  Feels much better this am. Less dyspneic. Rhythm atrial flutter with controlled ventricular response.  Objective:  Vital Signs in the last 24 hours: Temp:  [97.5 F (36.4 C)-98.7 F (37.1 C)] 98.7 F (37.1 C) (08/20 0500) Pulse Rate:  [72-119] 75 (08/20 0500) Resp:  [22] 22 (08/19 1219) BP: (90-127)/(50-64) 105/63 mmHg (08/20 0500) SpO2:  [97 %-100 %] 97 % (08/20 0500) Weight:  [274 lb (124.286 kg)] 274 lb (124.286 kg) (08/20 0500)  Intake/Output from previous day: 08/19 0701 - 08/20 0700 In: 112.5 [I.V.:112.5] Out: 1900 [Urine:1900] Intake/Output from this shift:    . allopurinol  300 mg Oral Daily  . aspirin  81 mg Oral Daily  . atorvastatin  20 mg Oral q1800  . buPROPion  150 mg Oral Daily  . docusate sodium  100 mg Oral BID  . furosemide  40 mg Intravenous BID  . gabapentin  600 mg Oral TID  . lisinopril  5 mg Oral Daily  . LORazepam  0.5 mg Oral QHS  . sodium chloride  3 mL Intravenous Q12H   . diltiazem (CARDIZEM) infusion 5 mg/hr (07/12/13 0938)  . heparin 1,750 Units/hr (07/13/13 0245)    Physical Exam: The patient appears to be in no distress.  Head and neck exam reveals that the pupils are equal and reactive.  The extraocular movements are full.  There is no scleral icterus.  Mouth and pharynx are benign.  No lymphadenopathy.  No carotid bruits.  The jugular venous pressure is normal.  Thyroid is not enlarged or tender.  Chest is clear to percussion and auscultation.  No rales or rhonchi.  Expansion of the chest is symmetrical.  Heart reveals no abnormal lift or heave.  First and second heart sounds are normal.  There is no murmur gallop rub or click. Pulse irregular.  The abdomen is soft obese and nontender.  Bowel sounds are normoactive.  There is no hepatosplenomegaly or mass.  There are no abdominal bruits.  Extremities reveal no phlebitis or edema.  Pedal pulses are good.  There is no cyanosis or clubbing.  Neurologic exam is normal  strength and no lateralizing weakness.  No sensory deficits.  Integument reveals no rash  Lab Results:  Recent Labs  07/12/13 0749 07/13/13 0530  WBC 7.9 8.3  HGB 14.9 13.6  PLT 261 255    Recent Labs  07/12/13 0749 07/12/13 2052  NA 139 139  K 3.5 4.0  CL 104 102  CO2 24 25  GLUCOSE 165* 180*  BUN 17 23  CREATININE 1.15 1.33    Recent Labs  07/11/13 2003 07/12/13 0040  TROPONINI <0.30 <0.30   Hepatic Function Panel  Recent Labs  07/11/13 1408  PROT 7.6  ALBUMIN 3.3*  AST 25  ALT 18  ALKPHOS 63  BILITOT 1.1   No results found for this basename: CHOL,  in the last 72 hours No results found for this basename: PROTIME,  in the last 72 hours  Imaging: X-ray Chest Pa And Lateral   07/11/2013   *RADIOLOGY REPORT*  Clinical Data: Shortness of breath.  Chest pressure and chills.  CHEST - 2 VIEW  Comparison: 12/31/2012  Findings: Moderate cardiac enlargement is identified.  Pulmonary vascular congestion is present.  No pleural effusion or edema identified.  No airspace consolidation.  IMPRESSION:  1.  Moderate cardiac enlargement with pulmonary vascular congestion. 2.  No pneumonia.   Original Report Authenticated By: Signa Kell, M.D.    Cardiac Studies: 2D  echo 07/12/13: - Left ventricle: The cavity size was normal. Wall thickness was increased in a pattern of moderate LVH. Systolic function was moderately to severely reduced. The estimated ejection fraction was in the range of 30% to 35%. Diffuse hypokinesis. - Left atrium: The atrium was moderately dilated. The study was technically difficult because of his body habitus. Assessment/Plan:  1. atrial flutter unknown duration. Rate controlled on cardizem. 2. Acute on chronic systolic heart failure with low EF. 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 3. obstructive sleep apnea  4. 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 5. diabetes mellitus with diabetic peripheral  neuropathy  6. history of gout  Plan: with his low EF will stop diltiazem and use BB for rate control.  For his atrial flutter the patient will need adequate anticoagulation. Depending on affordability, Xarelto might be a good choice for him. It may be worth having financial counselor explore his options in terms of his insurance coverage. He has coverage under his wife's policy. If he fails to convert on his own back to normal sinus rhythm he would be a candidate for subsequent outpatient cardioversion after he has been on adequate reliable anticoagulation for 4 weeks or more.   LOS: 2 days    Cassell Clement 07/13/2013, 7:28 AM

## 2013-07-15 DIAGNOSIS — I4892 Unspecified atrial flutter: Secondary | ICD-10-CM

## 2013-07-15 NOTE — Discharge Summary (Signed)
  Date: 07/15/2013  Patient name: Jeremiah Perkins  Medical record number: [id number]  Date of birth: [birth year]   This patient has been seen and the plan of care was discussed with the house staff. Please see their note for complete details. I concur with their findings and plan.   Jonah Blue, DO, FACP Faculty [facility] 07/15/2013, 4:44 PM

## 2013-07-18 ENCOUNTER — Ambulatory Visit: Payer: Self-pay

## 2013-07-18 ENCOUNTER — Ambulatory Visit: Payer: Self-pay | Admitting: Internal Medicine

## 2013-07-19 ENCOUNTER — Ambulatory Visit: Payer: BC Managed Care – PPO

## 2013-07-19 ENCOUNTER — Ambulatory Visit: Payer: Self-pay

## 2013-07-19 ENCOUNTER — Ambulatory Visit (INDEPENDENT_AMBULATORY_CARE_PROVIDER_SITE_OTHER): Payer: BC Managed Care – PPO | Admitting: Internal Medicine

## 2013-07-19 ENCOUNTER — Ambulatory Visit: Payer: Self-pay | Admitting: Internal Medicine

## 2013-07-19 ENCOUNTER — Encounter: Payer: Self-pay | Admitting: Internal Medicine

## 2013-07-19 VITALS — BP 113/71 | HR 114 | Temp 97.0°F | Ht 67.0 in | Wt 270.3 lb

## 2013-07-19 DIAGNOSIS — Z299 Encounter for prophylactic measures, unspecified: Secondary | ICD-10-CM

## 2013-07-19 DIAGNOSIS — M129 Arthropathy, unspecified: Secondary | ICD-10-CM

## 2013-07-19 DIAGNOSIS — Z23 Encounter for immunization: Secondary | ICD-10-CM

## 2013-07-19 DIAGNOSIS — M1990 Unspecified osteoarthritis, unspecified site: Secondary | ICD-10-CM

## 2013-07-19 DIAGNOSIS — I5020 Unspecified systolic (congestive) heart failure: Secondary | ICD-10-CM

## 2013-07-19 DIAGNOSIS — I4892 Unspecified atrial flutter: Secondary | ICD-10-CM

## 2013-07-19 LAB — CBC
HCT: 42.9 % (ref 39.0–52.0)
MCHC: 34.7 g/dL (ref 30.0–36.0)
Platelets: 330 10*3/uL (ref 150–400)
RDW: 14.7 % (ref 11.5–15.5)
WBC: 6.6 10*3/uL (ref 4.0–10.5)

## 2013-07-19 LAB — POCT INR: INR: 1.2

## 2013-07-19 LAB — GLUCOSE, CAPILLARY: Glucose-Capillary: 148 mg/dL — ABNORMAL HIGH (ref 70–99)

## 2013-07-19 MED ORDER — HYDROCODONE-ACETAMINOPHEN 5-325 MG PO TABS: 1.0000 | ORAL_TABLET | Freq: Four times a day (QID) | ORAL | Status: DC | PRN

## 2013-07-19 MED ORDER — WARFARIN SODIUM 5 MG PO TABS: ORAL_TABLET | ORAL | Status: DC

## 2013-07-19 NOTE — Progress Notes (Signed)
Pulse rechecked 4:15PM 55-78. Dr Zada Girt aware. Stanton Kidney Barkley Kratochvil RN 07/19/13 5PM

## 2013-07-19 NOTE — Patient Instructions (Addendum)
Please take your medications as before  I will order some blood checks today and I will give you a call if needed. Take 15 mg today and 15 mg tomorrow and 10 mg on Thursday and then see Dr. Alexandria Lodge on Friday at 10 am. Please follow up with you cardiologist Please follow up in 1 month

## 2013-07-19 NOTE — Assessment & Plan Note (Signed)
Patient's heart failure is currently stable. Most recent EF of 30-35%. No weight gain. Patient does not look fluid overloaded on physical exam.  Plan. -Defer addition of any other medications like spironolactone to his cardiologist - appt on 9/10 - BMP today - Encouraged the patient to continue checking his weights daily - He will continue with furosemide 40 mg twice a day - Counseled about a low salt diet and restrict his fluid intake.

## 2013-07-19 NOTE — Progress Notes (Signed)
Patient ID: Jeremiah Perkins, male   DOB: 11-09-1961, 52 y.o.   MRN: [id number]   Subjective:   HPI: [name] is a 52 y.o. gentleman, with past medical history of hypertension, systolic heart failure, gout, type 2 diabetes, major depression, morbid obesity, obstructive sleep apnea, and a recent admission for acute on chronic systolic heart failure, and a newly diagnosed atrial flutter, who presents to the clinic for hospital followup visit. He was discharged on 07/15/2013.  Patient reports that his been feeling tired and worried following the news about his worsening heart. No suicidal ideations or attempts. Patient specifically denies shortness of breath, palpitations, chest pain, or dizziness. His been compliant with his medications, including carvedilol, furosemide, lisinopril, and Coumadin. He denies dietary indiscretions. He has limited his fluid intake to 64 ounces per day. He is observing a salt free diet. The patient's dry weight is 272 pounds. Today he weighs 270. He denies fevers, chills, nausea, or vomiting. No history of bleeding. No hematuria, or bloody stools.  In terms of his depression, patient reports that he is seeing he has a psychiatrist appt next month for further treatment. He is currently taking gabapentin, which he feels that helps some.   For his chronic pain, the patient is taking Vicodin 2-3 times daily, one pill as needed. He also takes colchicine for his gout.  Kindly see the A&P for the status of the pt's chronic medical problems.     Past Medical History  Diagnosis Date  . Diabetes mellitus type 2, controlled, with complications DX: 1991    previously on insulin in 2000 for 3-4 years (but was stopped because adamantly did not want to be on insulin)  . Hypertension   . Congestive heart failure     No echo data available. Myocardial perfusion 2005 with EF 61%, presumed diastolic HF   . Diverticulosis     with history of diverticulitis in 10/2011  .  Gout   . Arthritis     involving knees, ankles, back, elbows  . Major depression   . Diabetic peripheral neuropathy   . Anxiety   . Psychosexual dysfunction with inhibited sexual excitement   . Hyperlipidemia   . OSA (obstructive sleep apnea) 1998    previously on CPAP - unable to tolerate   Current Outpatient Prescriptions  Medication Sig Dispense Refill  . albuterol (PROVENTIL HFA;VENTOLIN HFA) 108 (90 BASE) MCG/ACT inhaler Inhale 2 puffs into the lungs every 6 (six) hours as needed. For wheezing      . allopurinol (ZYLOPRIM) 300 MG tablet Take 300 mg by mouth daily.      Marland Kitchen aspirin 81 MG tablet Take 81 mg by mouth daily.      Marland Kitchen atorvastatin (LIPITOR) 20 MG tablet Take 1 tablet (20 mg total) by mouth daily at 6 PM.  30 tablet  0  . Blood Glucose Monitoring Suppl (AGAMATRIX PRESTO PRO METER) DEVI 1 Device by Does not apply route once.  1 Device  0  . carvedilol (COREG) 6.25 MG tablet Take 1 tablet (6.25 mg total) by mouth 2 (two) times daily with a meal.  60 tablet  0  . colchicine 0.6 MG tablet Take 0.6 mg by mouth daily.      Marland Kitchen docusate sodium (COLACE) 100 MG capsule Take 100 mg by mouth daily as needed for constipation. For constipation      . furosemide (LASIX) 40 MG tablet Take 1 tablet (40 mg total) by mouth 2 (two) times daily.  60 tablet  2  . gabapentin (NEURONTIN) 600 MG tablet Take 600-1,200 mg by mouth 2 (two) times daily. Takes 600mg  in the am & 1200mg  in the pm      . glucose blood (AGAMATRIX PRESTO TEST) test strip Use as instructed  100 each  12  . HYDROcodone-acetaminophen (NORCO/VICODIN) 5-325 MG per tablet Take 1 tablet by mouth every 6 (six) hours as needed for pain.  60 tablet  1  . Liraglutide (VICTOZA) 18 MG/3ML SOLN Inject 1.8 mg into the skin daily.      Marland Kitchen lisinopril (PRINIVIL,ZESTRIL) 5 MG tablet Take 1 tablet (5 mg total) by mouth daily.  30 tablet  0  . LORazepam (ATIVAN) 0.5 MG tablet Take 0.5 mg by mouth at bedtime as needed for anxiety. For anxiety      .  metFORMIN (GLUCOPHAGE) 500 MG tablet Take 1,000 mg by mouth 2 (two) times daily with a meal.      . Multiple Vitamins-Minerals (MEGA MULTIVITAMIN FOR MEN) TABS Take 1 capsule by mouth every morning.  30 tablet  3  . sildenafil (VIAGRA) 100 MG tablet Take 100 mg by mouth daily as needed. For ED      . warfarin (COUMADIN) 5 MG tablet Take 15 mg today and 15 mg tomorrow and 10 mg on Thursday and then see Dr. Alexandria Lodge on Friday at 10 am.  60 tablet  0   No current facility-administered medications for this visit.   Family History  Problem Relation Age of Onset  . Diabetes Mother   . CAD Mother 68    requiring quadruple bypass  . Hypertension Mother   . Stomach cancer Father   . Lung cancer Father     was a smoker  . Gout Father   . CAD Brother 48    requiring quadruple bypass  . Seizures Brother   . Hypertension Brother   . Diabetes Maternal Grandmother   . Alzheimer's disease Maternal Grandmother   . Alzheimer's disease Mother   . Breast cancer Paternal Aunt   . Diabetes Maternal Aunt    History   Social History  . Marital Status: Married    Spouse Name: N/A    Number of Children: 3  . Years of Education: AA   Occupational History  . Now unemployed     Barrister's clerk at [employer]   Social History Main Topics  . Smoking status: Former Smoker -- 0.10 packs/day for 10 years    Types: Cigarettes  . Smokeless tobacco: Never Used     Comment: Smokes 3 cigs per week / previously smoked 1ppd x 1-2 years  . Alcohol Use: Yes     Comment: 3-4 beers/liquor per week  . Drug Use: No  . Sexual Activity: None   Other Topics Concern  . 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 They each have children but none between the two of them.   Review of Systems: Constitutional: Denies fever, chills, diaphoresis, appetite change and fatigue.  Respiratory: Denies SOB, DOE, cough, chest tightness, and wheezing.  Cardiovascular: No  chest pain, palpitations and leg swelling.  Gastrointestinal: No abdominal pain, nausea, vomiting, bloody stools Genitourinary: No dysuria, frequency, hematuria, or flank pain.  Musculoskeletal:  pain in his multiple joints, which he attributes to gout, and arthritis.  Objective:  Physical Exam: Filed Vitals:   07/19/13 1502  BP: 113/71  Pulse: 114  Temp: 97 F (36.1  C)  TempSrc: Oral  Height: 5\' 7"  (1.702 m)  Weight: 270 lb 4.8 oz (122.607 kg)  SpO2: 97%   General:  wife in exam room. Obese. , in a wheelchair, No acute distress.  Lungs: CTA bilaterally. Heart: irregular heart rate.with pulse ranging between 55-114; no extra sounds or murmurs  Abdomen: Non-distended, normal BS, soft, nontender; no hepatosplenomegaly  Extremities: No pedal edema. No joint swelling or tenderness. Neurologic: Alert and oriented x3. No obvious neurologic deficits.  Assessment & Plan:  I have discussed my assessment and plan  with Dr. Dalphine Handing as detailed under problem based charting.

## 2013-07-19 NOTE — Assessment & Plan Note (Addendum)
Patient has irregular heartbeat, with a tachycardia of 114. On repeat check after 30 minutes in the clinic improved to 55-78. He denies symptoms. Currently on Coreg 6.25 mg twice daily. Patient reports compliance with his Coumadin. His INR 1.2. Discussed with Dr. Alexandria Lodge on the phone, who recommended the patient takes Warfarin 15 mg for the next 2 days(Tuesday and Wednesday) and 10 mg on Thursday. He will then to followup with him on Friday for a recheck INR at 10am. I communicated instructions to the patient with correct to read back. Encouraged the patient to keep his appointment with Dr. Alexandria Lodge. Again emphasized to the patient about the side effects of Coumadin and the need to keep his INR between 2 - 3. Patient has an upcoming appointment with Dr. Patty Sermons of cardiology on 08/03/2013. Further adjustment of his medications can be addressed on that visit.

## 2013-07-20 LAB — BASIC METABOLIC PANEL WITH GFR
BUN: 37 mg/dL — ABNORMAL HIGH (ref 6–23)
Calcium: 9.5 mg/dL (ref 8.4–10.5)
Creat: 1.47 mg/dL — ABNORMAL HIGH (ref 0.50–1.35)
GFR, Est African American: 62 mL/min
GFR, Est Non African American: 53 mL/min — ABNORMAL LOW

## 2013-07-21 NOTE — Progress Notes (Signed)
Case discussed with Dr.Kazibwe at the time of the visit.  We reviewed the resident's history and exam and pertinent patient test results.  I agree with the assessment, diagnosis, and plan of care documented in the resident's note.    

## 2013-07-22 ENCOUNTER — Ambulatory Visit (INDEPENDENT_AMBULATORY_CARE_PROVIDER_SITE_OTHER): Payer: BC Managed Care – PPO | Admitting: Pharmacist

## 2013-07-22 DIAGNOSIS — I4892 Unspecified atrial flutter: Secondary | ICD-10-CM

## 2013-07-22 DIAGNOSIS — Z7901 Long term (current) use of anticoagulants: Secondary | ICD-10-CM | POA: Insufficient documentation

## 2013-07-22 LAB — POCT INR: INR: 1.5

## 2013-07-22 NOTE — Progress Notes (Signed)
Anti-Coagulation Progress Note  Jeremiah Perkins is a 52 y.o. male who is currently on an anti-coagulation regimen.    RECENT RESULTS: Recent results are below, the most recent result is correlated with a dose of 15mg  x 2 days followed by 10mg  on third day, INR today. Will send out on 52.5mg  over 4 days and RTC on Tuesday 2-SEP at 1015h. Lab Results  Component Value Date   INR 1.5 07/22/2013   INR 1.2 07/19/2013   INR 1.09 12/31/2012    ANTI-COAG DOSE: Anticoagulation Dose Instructions as of 07/22/2013     Glynis Smiles Tue Wed Thu Fri Sat   New Dose 12.5 mg 10 mg 0 mg 0 mg 0 mg 15 mg 15 mg       ANTICOAG SUMMARY: Anticoagulation Episode Summary   Current INR goal 2.0-3.0  Next INR check 07/26/2013  INR from last check 1.5! (07/22/2013)  Weekly max dose   Target end date   [facility] check [facility]  Preferred lab   Send INR reminders to    Indications  Atrial flutter [427.32] Long term (current) use of anticoagulants [V58.61]        Comments         ANTICOAG TODAY: Anticoagulation Summary as of 07/22/2013   INR goal 2.0-3.0  Selected INR 1.5! (07/22/2013)  Next INR check 07/26/2013  Target end date    Indications  Atrial flutter [427.32] Long term (current) use of anticoagulants [V58.61]      Anticoagulation Episode Summary   [facility]   Preferred lab    Send INR reminders to    Comments       PATIENT INSTRUCTIONS: Patient Instructions  Patient instructed to take medications as defined in the Anti-coagulation Track section of this encounter.  Patient instructed to take today's dose.  Patient verbalized understanding of these instructions.       FOLLOW-UP Return in 4 days (on 07/26/2013) for Follow up INR at 1015h.  Hulen Luster, III Pharm.D., CACP

## 2013-07-22 NOTE — Patient Instructions (Signed)
Patient instructed to take medications as defined in the Anti-coagulation Track section of this encounter.  Patient instructed to take today's dose.  Patient verbalized understanding of these instructions.    

## 2013-07-26 ENCOUNTER — Other Ambulatory Visit: Payer: Self-pay

## 2013-07-26 ENCOUNTER — Emergency Department (HOSPITAL_COMMUNITY): Payer: BC Managed Care – PPO

## 2013-07-26 ENCOUNTER — Inpatient Hospital Stay (HOSPITAL_COMMUNITY)
Admission: EM | Admit: 2013-07-26 | Discharge: 2013-07-31 | DRG: 544 | Disposition: A | Discharge status: Home Health | Payer: BC Managed Care – PPO | Attending: Internal Medicine | Admitting: Internal Medicine

## 2013-07-26 ENCOUNTER — Encounter (HOSPITAL_COMMUNITY): Payer: Self-pay | Admitting: *Deleted

## 2013-07-26 ENCOUNTER — Ambulatory Visit (INDEPENDENT_AMBULATORY_CARE_PROVIDER_SITE_OTHER): Payer: BC Managed Care – PPO | Admitting: Pharmacist

## 2013-07-26 ENCOUNTER — Inpatient Hospital Stay (HOSPITAL_COMMUNITY): Payer: BC Managed Care – PPO

## 2013-07-26 DIAGNOSIS — Z8249 Family history of ischemic heart disease and other diseases of the circulatory system: Secondary | ICD-10-CM

## 2013-07-26 DIAGNOSIS — Z833 Family history of diabetes mellitus: Secondary | ICD-10-CM

## 2013-07-26 DIAGNOSIS — I5043 Acute on chronic combined systolic (congestive) and diastolic (congestive) heart failure: Secondary | ICD-10-CM | POA: Diagnosis present

## 2013-07-26 DIAGNOSIS — E1149 Type 2 diabetes mellitus with other diabetic neurological complication: Secondary | ICD-10-CM | POA: Diagnosis present

## 2013-07-26 DIAGNOSIS — G4733 Obstructive sleep apnea (adult) (pediatric): Secondary | ICD-10-CM | POA: Diagnosis present

## 2013-07-26 DIAGNOSIS — E1142 Type 2 diabetes mellitus with diabetic polyneuropathy: Secondary | ICD-10-CM | POA: Diagnosis present

## 2013-07-26 DIAGNOSIS — Z7901 Long term (current) use of anticoagulants: Secondary | ICD-10-CM

## 2013-07-26 DIAGNOSIS — Z882 Allergy status to sulfonamides status: Secondary | ICD-10-CM

## 2013-07-26 DIAGNOSIS — I5022 Chronic systolic (congestive) heart failure: Secondary | ICD-10-CM

## 2013-07-26 DIAGNOSIS — E785 Hyperlipidemia, unspecified: Secondary | ICD-10-CM | POA: Diagnosis present

## 2013-07-26 DIAGNOSIS — Z7982 Long term (current) use of aspirin: Secondary | ICD-10-CM

## 2013-07-26 DIAGNOSIS — Z800 Family history of malignant neoplasm of digestive organs: Secondary | ICD-10-CM

## 2013-07-26 DIAGNOSIS — I4892 Unspecified atrial flutter: Principal | ICD-10-CM

## 2013-07-26 DIAGNOSIS — I10 Essential (primary) hypertension: Secondary | ICD-10-CM

## 2013-07-26 DIAGNOSIS — F329 Major depressive disorder, single episode, unspecified: Secondary | ICD-10-CM | POA: Diagnosis present

## 2013-07-26 DIAGNOSIS — Z801 Family history of malignant neoplasm of trachea, bronchus and lung: Secondary | ICD-10-CM

## 2013-07-26 DIAGNOSIS — Z87891 Personal history of nicotine dependence: Secondary | ICD-10-CM

## 2013-07-26 DIAGNOSIS — R05 Cough: Secondary | ICD-10-CM

## 2013-07-26 DIAGNOSIS — F411 Generalized anxiety disorder: Secondary | ICD-10-CM | POA: Diagnosis present

## 2013-07-26 DIAGNOSIS — R1909 Other intra-abdominal and pelvic swelling, mass and lump: Secondary | ICD-10-CM

## 2013-07-26 DIAGNOSIS — R079 Chest pain, unspecified: Secondary | ICD-10-CM

## 2013-07-26 DIAGNOSIS — I428 Other cardiomyopathies: Secondary | ICD-10-CM | POA: Diagnosis present

## 2013-07-26 DIAGNOSIS — Z6379 Other stressful life events affecting family and household: Secondary | ICD-10-CM

## 2013-07-26 DIAGNOSIS — Z91012 Allergy to eggs: Secondary | ICD-10-CM

## 2013-07-26 DIAGNOSIS — E118 Type 2 diabetes mellitus with unspecified complications: Secondary | ICD-10-CM | POA: Diagnosis present

## 2013-07-26 DIAGNOSIS — R059 Cough, unspecified: Secondary | ICD-10-CM

## 2013-07-26 DIAGNOSIS — M109 Gout, unspecified: Secondary | ICD-10-CM | POA: Diagnosis present

## 2013-07-26 DIAGNOSIS — I5023 Acute on chronic systolic (congestive) heart failure: Secondary | ICD-10-CM

## 2013-07-26 DIAGNOSIS — Z79899 Other long term (current) drug therapy: Secondary | ICD-10-CM

## 2013-07-26 DIAGNOSIS — I509 Heart failure, unspecified: Secondary | ICD-10-CM

## 2013-07-26 HISTORY — DX: Unspecified atrial flutter: I48.92

## 2013-07-26 HISTORY — DX: Dorsalgia, unspecified: M54.9

## 2013-07-26 HISTORY — DX: Type 2 diabetes mellitus without complications: E11.9

## 2013-07-26 HISTORY — DX: Morbid (severe) obesity due to excess calories: E66.01

## 2013-07-26 HISTORY — DX: Unspecified chronic bronchitis: J42

## 2013-07-26 HISTORY — DX: Other chronic pain: G89.29

## 2013-07-26 LAB — PRO B NATRIURETIC PEPTIDE: Pro B Natriuretic peptide (BNP): 1309 pg/mL — ABNORMAL HIGH (ref 0–125)

## 2013-07-26 LAB — BASIC METABOLIC PANEL
BUN: 22 mg/dL (ref 6–23)
Creatinine, Ser: 1.02 mg/dL (ref 0.50–1.35)
GFR calc Af Amer: >90 mL/min (ref >90)
GFR calc non Af Amer: 81 mL/min — ABNORMAL LOW (ref >90)

## 2013-07-26 LAB — POCT I-STAT TROPONIN I

## 2013-07-26 LAB — CBC
Hemoglobin: 15.6 g/dL (ref 13.0–17.0)
MCH: 31.3 pg (ref 26.0–34.0)
MCHC: 35.2 g/dL (ref 30.0–36.0)
RDW: 13.5 % (ref 11.5–15.5)

## 2013-07-26 LAB — HEPATIC FUNCTION PANEL
ALT: 19 U/L (ref 0–53)
AST: 21 U/L (ref 0–37)
Alkaline Phosphatase: 55 U/L (ref 39–117)
Bilirubin, Direct: 0.1 mg/dL (ref 0.0–0.3)
Total Bilirubin: 0.7 mg/dL (ref 0.3–1.2)

## 2013-07-26 LAB — TROPONIN I: Troponin I: <0.3 ng/mL (ref <0.30)

## 2013-07-26 MED ORDER — SODIUM CHLORIDE 0.9 % IJ SOLN: 3.0000 mL | INTRAMUSCULAR | Status: DC | PRN

## 2013-07-26 MED ORDER — ALBUTEROL SULFATE HFA 108 (90 BASE) MCG/ACT IN AERS: 2.0000 | INHALATION_SPRAY | Freq: Four times a day (QID) | RESPIRATORY_TRACT | Status: DC | PRN

## 2013-07-26 MED ORDER — GI COCKTAIL ~~LOC~~
30.0000 mL | Freq: Three times a day (TID) | ORAL | Status: DC | PRN
  Administered 2013-07-26 – 2013-07-29 (×3): 30 mL via ORAL
  Filled 2013-07-26 (×4): qty 30

## 2013-07-26 MED ORDER — DIAZEPAM 5 MG PO TABS
5.0000 mg | ORAL_TABLET | ORAL | Status: AC
  Administered 2013-07-27: 5 mg via ORAL
  Filled 2013-07-26: qty 1

## 2013-07-26 MED ORDER — METOPROLOL TARTRATE 1 MG/ML IV SOLN
5.0000 mg | Freq: Once | INTRAVENOUS | Status: DC
  Filled 2013-07-26: qty 5

## 2013-07-26 MED ORDER — CARVEDILOL 6.25 MG PO TABS
6.2500 mg | ORAL_TABLET | Freq: Two times a day (BID) | ORAL | Status: DC
  Administered 2013-07-27 – 2013-07-31 (×8): 6.25 mg via ORAL
  Filled 2013-07-26 (×12): qty 1

## 2013-07-26 MED ORDER — HYDROCODONE-ACETAMINOPHEN 5-325 MG PO TABS: 1.0000 | ORAL_TABLET | Freq: Four times a day (QID) | ORAL | Status: DC | PRN

## 2013-07-26 MED ORDER — GABAPENTIN 300 MG PO CAPS
600.0000 mg | ORAL_CAPSULE | Freq: Every day | ORAL | Status: DC
  Administered 2013-07-27 – 2013-07-31 (×5): 600 mg via ORAL
  Filled 2013-07-26 (×5): qty 2

## 2013-07-26 MED ORDER — GABAPENTIN 600 MG PO TABS: 600.0000 mg | ORAL_TABLET | Freq: Two times a day (BID) | ORAL | Status: DC

## 2013-07-26 MED ORDER — SODIUM CHLORIDE 0.9 % IJ SOLN
3.0000 mL | Freq: Two times a day (BID) | INTRAMUSCULAR | Status: DC
  Administered 2013-07-27: 3 mL via INTRAVENOUS

## 2013-07-26 MED ORDER — ATORVASTATIN CALCIUM 40 MG PO TABS
40.0000 mg | ORAL_TABLET | Freq: Every day | ORAL | Status: DC
  Administered 2013-07-27 – 2013-07-30 (×4): 40 mg via ORAL
  Filled 2013-07-26 (×6): qty 1

## 2013-07-26 MED ORDER — ASPIRIN 81 MG PO TABS: 81.0000 mg | ORAL_TABLET | Freq: Every day | ORAL | Status: DC

## 2013-07-26 MED ORDER — SODIUM CHLORIDE 0.9 % IV SOLN: 250.0000 mL | INTRAVENOUS | Status: DC | PRN

## 2013-07-26 MED ORDER — MORPHINE SULFATE 4 MG/ML IJ SOLN
4.0000 mg | Freq: Once | INTRAMUSCULAR | Status: AC
  Administered 2013-07-26: 4 mg via INTRAVENOUS
  Filled 2013-07-26: qty 1

## 2013-07-26 MED ORDER — DOCUSATE SODIUM 100 MG PO CAPS
100.0000 mg | ORAL_CAPSULE | Freq: Every day | ORAL | Status: DC | PRN
  Filled 2013-07-26 (×2): qty 1

## 2013-07-26 MED ORDER — INSULIN ASPART 100 UNIT/ML ~~LOC~~ SOLN
0.0000 [IU] | Freq: Three times a day (TID) | SUBCUTANEOUS | Status: DC
  Administered 2013-07-26 – 2013-07-29 (×6): 2 [IU] via SUBCUTANEOUS
  Administered 2013-07-29: 3 [IU] via SUBCUTANEOUS
  Administered 2013-07-30 (×2): 2 [IU] via SUBCUTANEOUS
  Administered 2013-07-30: 3 [IU] via SUBCUTANEOUS
  Administered 2013-07-31: 1 [IU] via SUBCUTANEOUS
  Administered 2013-07-31: 3 [IU] via SUBCUTANEOUS

## 2013-07-26 MED ORDER — GABAPENTIN 400 MG PO CAPS
1200.0000 mg | ORAL_CAPSULE | Freq: Every day | ORAL | Status: DC
  Administered 2013-07-26 – 2013-07-30 (×5): 1200 mg via ORAL
  Filled 2013-07-26 (×6): qty 3

## 2013-07-26 MED ORDER — HEPARIN (PORCINE) IN NACL 100-0.45 UNIT/ML-% IJ SOLN
1900.0000 [IU]/h | INTRAMUSCULAR | Status: DC
  Administered 2013-07-26: 1900 [IU]/h via INTRAVENOUS
  Filled 2013-07-26 (×4): qty 250

## 2013-07-26 MED ORDER — FUROSEMIDE 40 MG PO TABS
40.0000 mg | ORAL_TABLET | Freq: Two times a day (BID) | ORAL | Status: DC
  Administered 2013-07-27 – 2013-07-28 (×3): 40 mg via ORAL
  Filled 2013-07-26 (×8): qty 1

## 2013-07-26 MED ORDER — LORAZEPAM 0.5 MG PO TABS
0.5000 mg | ORAL_TABLET | Freq: Every evening | ORAL | Status: DC | PRN
  Administered 2013-07-27 – 2013-07-30 (×3): 0.5 mg via ORAL
  Filled 2013-07-26 (×3): qty 1

## 2013-07-26 MED ORDER — GI COCKTAIL ~~LOC~~
30.0000 mL | Freq: Once | ORAL | Status: AC
  Administered 2013-07-26: 30 mL via ORAL
  Filled 2013-07-26: qty 30

## 2013-07-26 MED ORDER — ONDANSETRON HCL 4 MG/2ML IJ SOLN: 4.0000 mg | Freq: Four times a day (QID) | INTRAMUSCULAR | Status: DC | PRN

## 2013-07-26 MED ORDER — ALLOPURINOL 300 MG PO TABS
300.0000 mg | ORAL_TABLET | Freq: Every day | ORAL | Status: DC
  Administered 2013-07-27 – 2013-07-31 (×5): 300 mg via ORAL
  Filled 2013-07-26 (×5): qty 1

## 2013-07-26 MED ORDER — COLCHICINE 0.6 MG PO TABS
0.6000 mg | ORAL_TABLET | Freq: Every day | ORAL | Status: DC
  Administered 2013-07-27 – 2013-07-31 (×5): 0.6 mg via ORAL
  Filled 2013-07-26 (×5): qty 1

## 2013-07-26 MED ORDER — MORPHINE SULFATE 4 MG/ML IJ SOLN
4.0000 mg | INTRAMUSCULAR | Status: DC | PRN
  Administered 2013-07-26: 4 mg via INTRAVENOUS
  Filled 2013-07-26: qty 1

## 2013-07-26 MED ORDER — ASPIRIN 81 MG PO CHEW
81.0000 mg | CHEWABLE_TABLET | Freq: Every day | ORAL | Status: DC
  Administered 2013-07-27 – 2013-07-31 (×5): 81 mg via ORAL
  Filled 2013-07-26 (×6): qty 1

## 2013-07-26 MED ORDER — NITROGLYCERIN 0.4 MG SL SUBL
0.4000 mg | SUBLINGUAL_TABLET | SUBLINGUAL | Status: DC | PRN
  Administered 2013-07-26: 0.4 mg via SUBLINGUAL

## 2013-07-26 MED ORDER — ACETAMINOPHEN 325 MG PO TABS
650.0000 mg | ORAL_TABLET | ORAL | Status: DC | PRN
  Administered 2013-07-27: 650 mg via ORAL
  Filled 2013-07-26: qty 2

## 2013-07-26 MED ORDER — ZOLPIDEM TARTRATE 5 MG PO TABS: 5.0000 mg | ORAL_TABLET | Freq: Every evening | ORAL | Status: DC | PRN

## 2013-07-26 NOTE — Progress Notes (Signed)
Triage nurse confirmed that she transported patient directly to the emergency department from the anticoagulation clinic for evaluation of chest pain documented in the note by Dr. Alexandria Lodge.

## 2013-07-26 NOTE — Progress Notes (Signed)
ANTICOAGULATION CONSULT NOTE - Initial Consult  Pharmacy Consult for heparin Indication: atrial fibrillation  Allergies  Allergen Reactions  . Other     Eggs - nausea/vomiting (did ok with the flu shot, seems to be an issue with the type of preparation of the egg product)  . Sulfa Antibiotics Nausea And Vomiting    Patient Measurements:   Heparin Dosing Weight: 94.5kg  Vital Signs: Temp: 98.3 F (36.8 C) (09/02 1050) Temp src: Oral (09/02 1050) BP: 113/77 mmHg (09/02 1330) Pulse Rate: 55 (09/02 1330)  Labs:  Recent Labs  07/26/13 1040 07/26/13 1125  HGB  --  15.6  HCT  --  44.3  PLT  --  263  LABPROT  --  16.8*  INR 1.6 1.40  CREATININE  --  1.02  TROPONINI  --  <0.30    The CrCl is unknown because both a height and weight (above a minimum accepted value) are required for this calculation.   Medical History: Past Medical History  Diagnosis Date  . Diabetes mellitus type 2, controlled, with complications DX: 1991    previously on insulin in 2000 for 3-4 years (but was stopped because adamantly did not want to be on insulin)  . Hypertension   . Congestive heart failure     No echo data available. Myocardial perfusion 2005 with EF 61%, presumed diastolic HF   . Diverticulosis     with history of diverticulitis in 10/2011  . Gout   . Arthritis     involving knees, ankles, back, elbows  . Major depression   . Diabetic peripheral neuropathy   . Anxiety   . Psychosexual dysfunction with inhibited sexual excitement   . Hyperlipidemia   . OSA (obstructive sleep apnea) 1998    previously on CPAP - unable to tolerate  . Atrial flutter   . Morbid obesity     Medications:  Infusions:  . heparin      Assessment: 52 yom on chronic coumadin for afib presented to the hospital with CP. INR is subtherapeutic at 1.4. To start IV heparin in anticipation of  Cardiac cath tomorrow. Baseline CBC is WNL. Patient already took his coumadin today so INR may increase  tomorrow. Coumadin to restart post-cath.   Goal of Therapy:  INR 2-3 Heparin level 0.3-0.7 units/ml Monitor platelets by anticoagulation protocol: Yes   Plan:  1. No heparin bolus (elevated INR and already took coumadin dose today) 2. Heparin gtt 1900 units/hr (previously therapeutic at this rate at the end of August) 3. Check an 8 hour heparin level 4. Daily HL, CBC and INR 5. F/u cath plans to restart coumadin  Shalunda Lindh, Drake Leach 07/26/2013,3:32 PM

## 2013-07-26 NOTE — ED Notes (Signed)
MD at bedside. 

## 2013-07-26 NOTE — ED Provider Notes (Signed)
CSN: [id number]     Arrival date & time 07/26/13  1038 History   First MD Initiated Contact with Patient 07/26/13 1059     Chief Complaint  Patient presents with  . Chest Pain   (Consider location/radiation/quality/duration/timing/severity/associated sxs/prior Treatment) HPI Comments: 52 yo male with a flutter, follows coumadin clinic and [name], CHF hx, DM, HTN presents with 2 days of constant "indigestion" that he feels is worse with eating, especially after taking large pills.  No hx of choking or esophageal issues.  No gb issues. No exertional or diaphoresis. Pt has had milder versions in the past. No radiation but he has had a few brief numbness in right arm episodes.  No neck pain or sob.  No wt gain or leg swelling. No recent caths.    Patient is a 52 y.o. male presenting with chest pain. The history is provided by the patient.  Chest Pain Pain location:  Substernal area Associated symptoms: nausea   Associated symptoms: no abdominal pain, no back pain, no fever, no headache, no shortness of breath and not vomiting     Past Medical History  Diagnosis Date  . Diabetes mellitus type 2, controlled, with complications DX: 1991    previously on insulin in 2000 for 3-4 years (but was stopped because adamantly did not want to be on insulin)  . Hypertension   . Congestive heart failure     No echo data available. Myocardial perfusion 2005 with EF 61%, presumed diastolic HF   . Diverticulosis     with history of diverticulitis in 10/2011  . Gout   . Arthritis     involving knees, ankles, back, elbows  . Major depression   . Diabetic peripheral neuropathy   . Anxiety   . Psychosexual dysfunction with inhibited sexual excitement   . Hyperlipidemia   . OSA (obstructive sleep apnea) 1998    previously on CPAP - unable to tolerate  . Atrial flutter    Past Surgical History  Procedure Laterality Date  . Reconstructive to ankle       right - 2/2 trauma sustained after fall  .  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 Cyst removal leg      back of left leg   Family History  Problem Relation Age of Onset  . Diabetes Mother   . CAD Mother 43    requiring quadruple bypass  . Hypertension Mother   . Stomach cancer Father   . Lung cancer Father     was a smoker  . Gout Father   . CAD Brother 48    requiring quadruple bypass  . Seizures Brother   . Hypertension Brother   . Diabetes Maternal Grandmother   . Alzheimer's disease Maternal Grandmother   . Alzheimer's disease Mother   . Breast cancer Paternal Aunt   . Diabetes Maternal Aunt    History  Substance Use Topics  . Smoking status: Former Smoker -- 0.10 packs/day for 10 years    Types: Cigarettes  . Smokeless tobacco: Never Used     Comment: Smokes 3 cigs per week / previously smoked 1ppd x 1-2 years  . Alcohol Use: Yes     Comment: 3-4 beers/liquor per week    Review of Systems  Constitutional: Negative for fever and chills.  HENT: Negative for neck pain and neck stiffness.   Eyes: Negative for visual disturbance.  Respiratory: Negative for shortness of breath.   Cardiovascular: Positive for chest  pain.  Gastrointestinal: Positive for nausea and diarrhea. Negative for vomiting, abdominal pain and blood in stool.  Genitourinary: Negative for dysuria and flank pain.  Musculoskeletal: Negative for back pain.  Skin: Negative for rash.  Neurological: Negative for light-headedness and headaches.    Allergies  Other and Sulfa antibiotics  Home Medications   Current Outpatient Rx  Name  Route  Sig  Dispense  Refill  . albuterol (PROVENTIL HFA;VENTOLIN HFA) 108 (90 BASE) MCG/ACT inhaler   Inhalation   Inhale 2 puffs into the lungs every 6 (six) hours as needed. For wheezing         . allopurinol (ZYLOPRIM) 300 MG tablet   Oral   Take 300 mg by mouth daily.         Marland Kitchen aspirin 81 MG tablet   Oral   Take 81 mg by mouth daily.         Marland Kitchen atorvastatin (LIPITOR) 20 MG tablet   Oral    Take 1 tablet (20 mg total) by mouth daily at 6 PM.   30 tablet   0   . carvedilol (COREG) 6.25 MG tablet   Oral   Take 1 tablet (6.25 mg total) by mouth 2 (two) times daily with a meal.   60 tablet   0   . colchicine 0.6 MG tablet   Oral   Take 0.6 mg by mouth daily.         Marland Kitchen docusate sodium (COLACE) 100 MG capsule   Oral   Take 100 mg by mouth daily as needed for constipation. For constipation         . furosemide (LASIX) 40 MG tablet   Oral   Take 1 tablet (40 mg total) by mouth 2 (two) times daily.   60 tablet   2   . gabapentin (NEURONTIN) 600 MG tablet   Oral   Take 600-1,200 mg by mouth 2 (two) times daily. Takes 600mg  in the am & 1200mg  in the pm         . HYDROcodone-acetaminophen (NORCO/VICODIN) 5-325 MG per tablet   Oral   Take 1 tablet by mouth every 6 (six) hours as needed for pain.   60 tablet   1   . Liraglutide (VICTOZA) 18 MG/3ML SOLN   Subcutaneous   Inject 1.8 mg into the skin daily.         Marland Kitchen lisinopril (PRINIVIL,ZESTRIL) 5 MG tablet   Oral   Take 1 tablet (5 mg total) by mouth daily.   30 tablet   0   . metFORMIN (GLUCOPHAGE) 500 MG tablet   Oral   Take 1,000 mg by mouth 2 (two) times daily with a meal.         . Multiple Vitamins-Minerals (MEGA MULTIVITAMIN FOR MEN) TABS   Oral   Take 1 capsule by mouth every morning.   30 tablet   3   . warfarin (COUMADIN) 5 MG tablet   Oral   Take 10-15 mg by mouth See admin instructions. Take 10 mg today, takes 15 mg Wednesday, Thursday, Friday Saturday and Sunday pt gets INR test on monday         . Blood Glucose Monitoring Suppl (AGAMATRIX PRESTO PRO METER) DEVI   Does not apply   1 Device by Does not apply route once.   1 Device   0   . glucose blood (AGAMATRIX PRESTO TEST) test strip      Use as instructed   100 each  12   . LORazepam (ATIVAN) 0.5 MG tablet   Oral   Take 0.5 mg by mouth at bedtime as needed for anxiety. For anxiety         . sildenafil (VIAGRA) 100  MG tablet   Oral   Take 100 mg by mouth daily as needed. For ED          BP 102/47  Pulse 115  Temp(Src) 98.3 F (36.8 C) (Oral)  Resp 18  SpO2 99% Physical Exam  Nursing note and vitals reviewed. Constitutional: He is oriented to person, place, and time. He appears well-developed and well-nourished.  HENT:  Head: Normocephalic and atraumatic.  Eyes: Conjunctivae are normal. Right eye exhibits no discharge. Left eye exhibits no discharge.  Neck: Normal range of motion. Neck supple. No tracheal deviation present.  Cardiovascular: Normal rate, regular rhythm and intact distal pulses.   No murmur heard. Pulmonary/Chest: Effort normal and breath sounds normal.  Abdominal: Soft. He exhibits no distension. There is no tenderness. There is no guarding.  Musculoskeletal: He exhibits no edema and no tenderness.  Neurological: He is alert and oriented to person, place, and time.  Skin: Skin is warm. No rash noted.  Psychiatric: He has a normal mood and affect.    ED Course  Procedures (including critical care time) Labs Review Labs Reviewed  BASIC METABOLIC PANEL - Abnormal; Notable for the following:    Glucose, Bld 116 (*)    GFR calc non Af Amer 81 (*)    All other components within normal limits  PRO B NATRIURETIC PEPTIDE - Abnormal; Notable for the following:    Pro B Natriuretic peptide (BNP) 1309.0 (*)    All other components within normal limits  PROTIME-INR - Abnormal; Notable for the following:    Prothrombin Time 16.8 (*)    All other components within normal limits  HEPATIC FUNCTION PANEL - Abnormal; Notable for the following:    Albumin 3.2 (*)    All other components within normal limits  LIPASE, BLOOD - Abnormal; Notable for the following:    Lipase 62 (*)    All other components within normal limits  GLUCOSE, CAPILLARY - Abnormal; Notable for the following:    Glucose-Capillary 138 (*)    All other components within normal limits  CBC  TROPONIN I  TROPONIN  I  MAGNESIUM  HEPARIN LEVEL (UNFRACTIONATED)  CBC  BASIC METABOLIC PANEL  LIPID PANEL  PROTIME-INR  POCT I-STAT TROPONIN I   Imaging Review No results found.  MDM  No diagnosis found. EKG and cxr reviewed.  A flutter.    Date: 07/26/2013  Rate: 116  Rhythm:a flutter  QRS Axis: left  Intervals: QT prolonged  ST/T Wave abnormalities: nonspecific ST changes and low voltage  Conduction Disutrbances:left posterior fascicular block  Narrative Interpretation:   Old EKG Reviewed: unchanged HR improved without treatment. Mild congestion on CXR, reviewed.  Mild chest pressure on recheck. Cardiology evaluated and admitted.  Chest Pain, CHF, A flutter   Enid Skeens, MD 07/26/13 2129

## 2013-07-26 NOTE — Patient Instructions (Signed)
Patient instructed to take medications as defined in the Anti-coagulation Track section of this encounter.  Patient instructed to take today's dose.  Patient verbalized understanding of these instructions.    

## 2013-07-26 NOTE — ED Notes (Signed)
Pt went to coumadin clinic today and sent here for eval of not feeling well, indigestion sensation to mid chest and epigastric area x 2 days. Recently dc from hospital after diagnosis of atrial flutter. ekg done at triage, no acute distress noted at this time.

## 2013-07-26 NOTE — Consult Note (Signed)
Patient ID: Jeremiah Perkins MRN: [id number], DOB/AGE: 54-Feb-1962   Admit date: 07/26/2013 Date of Consult: @TODAY @  Primary Physician: Christen Bame, MD Primary Cardiologist: New  Problem List: Past Medical History  Diagnosis Date  . Diabetes mellitus type 2, controlled, with complications DX: 1991    previously on insulin in 2000 for 3-4 years (but was stopped because adamantly did not want to be on insulin)  . Hypertension   . Congestive heart failure     No echo data available. Myocardial perfusion 2005 with EF 61%, presumed diastolic HF   . Diverticulosis     with history of diverticulitis in 10/2011  . Gout   . Arthritis     involving knees, ankles, back, elbows  . Major depression   . Diabetic peripheral neuropathy   . Anxiety   . Psychosexual dysfunction with inhibited sexual excitement   . Hyperlipidemia   . OSA (obstructive sleep apnea) 1998    previously on CPAP - unable to tolerate  . Atrial flutter     Past Surgical History  Procedure Laterality Date  . Reconstructive to ankle       right - 2/2 trauma sustained after fall  . 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 Cyst removal leg      back of left leg     Allergies:  Allergies  Allergen Reactions  . Other     Eggs - nausea/vomiting (did ok with the flu shot, seems to be an issue with the type of preparation of the egg product)  . Sulfa Antibiotics Nausea And Vomiting    HPI: Patient is a 52 yo who we are asked to see for CP, CHF.  Hx DM2, HTN, OSA.   The patient was followed by [facility] remotely.  Had cath in 1990s with complic of groin mass.  Nuclear stress test in 2005.   Marland Kitchen  He presented to ER on 8/15 with SOB x 2 wks.  Recomm admission  Treated with IV lasix  x1 and patient  ON 8/18 he represented to ER and was admitted with CHF exacerbation (SOB, DOE,)  Echo done showed LVEF was approx 30 to 35%  Diffuse hypokinesis  (new as previous echo LVEF was 50 to 55% in 2012)   During admission patient  develped atrial flutter.  CHF felt to possibly be tach induced.  Sent home with plans for cardioversion in future SInce d/c the patient has continued to feel poorly  He is SOB  Cannot climb stairs.  Some PND  Notes chest tightness, discomfort  Has been there predominantly from Sunday.  Sbbsternal.  Patient diagnosed with sleep apnea in Feb 2014  Cannot afford CPAP.   Inpatient Medications:    Family History  Problem Relation Age of Onset  . Diabetes Mother   . CAD Mother 57    requiring quadruple bypass  . Hypertension Mother   . Stomach cancer Father   . Lung cancer Father     was a smoker  . Gout Father   . CAD Brother 48    requiring quadruple bypass  . Seizures Brother   . Hypertension Brother   . Diabetes Maternal Grandmother   . Alzheimer's disease Maternal Grandmother   . Alzheimer's disease Mother   . Breast cancer Paternal Aunt   . Diabetes Maternal Aunt      History   Social History  . Marital Status: Married    Spouse Name: N/A  Number of Children: 3  . Years of Education: AA   Occupational History  . Now unemployed     Barrister's clerk at [employer]   Social History Main Topics  . Smoking status: Former Smoker -- 0.10 packs/day for 10 years    Types: Cigarettes  . Smokeless tobacco: Never Used     Comment: Smokes 3 cigs per week / previously smoked 1ppd x 1-2 years  . Alcohol Use: Yes     Comment: 3-4 beers/liquor per week  . Drug Use: No  . Sexual Activity: Not on file   Other Topics Concern  . 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 They each have children but none between the two of them.     Review of Systems: Denies f/c  Has had dry cough since d/c  Appetite is down No signif palpitations.  Does have numbness and burning in feet.  Brother died last wk  Patient is under increased stress All other systems reviewed and are otherwise negative except as noted  above.  Physical Exam: Filed Vitals:   07/26/13 1305  BP: 96/62  Pulse:   Temp:   Resp: 27   No intake or output data in the 24 hours ending 07/26/13 1334  General: Obese 52 yo in no acute distress. Head: Normocephalic, atraumatic, sclera non-icteric Neck: Negative for carotid bruits. JVP is elevated to 11 Lungs: Clear bilaterally to auscultation without wheezes, rales, or rhonchi. Breathing is unlabored. Heart: RRR with S1 S2. No murmurs, rubs, or gallops appreciated. Abdomen: Distended  Supple  RUQ tenderness.   No hepatomegaly. No rebound/guarding. No obvious abdominal masses. Msk:  Strength and tone appears normal for age. Extremities: No clubbing, cyanosis or edema.  Distal pedal pulses are 2+ and equal bilaterally. Neuro: Alert and oriented X 3. Moves all extremities spontaneously. Psych:  Responds to questions appropriately with a normal affect.  Labs: Results for orders placed during the hospital encounter of 07/26/13 (from the past 24 hour(s))  CBC     Status: None   Collection Time    07/26/13 11:25 AM      Result Value Range   WBC 8.3  4.0 - 10.5 K/uL   RBC 4.98  4.22 - 5.81 MIL/uL   Hemoglobin 15.6  13.0 - 17.0 g/dL   HCT 46.9  62.9 - 52.8 %   MCV 89.0  78.0 - 100.0 fL   MCH 31.3  26.0 - 34.0 pg   MCHC 35.2  30.0 - 36.0 g/dL   RDW 41.3  24.4 - 01.0 %   Platelets 263  150 - 400 K/uL  BASIC METABOLIC PANEL     Status: Abnormal   Collection Time    07/26/13 11:25 AM      Result Value Range   Sodium 138  135 - 145 mEq/L   Potassium 4.4  3.5 - 5.1 mEq/L   Chloride 100  96 - 112 mEq/L   CO2 26  19 - 32 mEq/L   Glucose, Bld 116 (*) 70 - 99 mg/dL   BUN 22  6 - 23 mg/dL   Creatinine, Ser 2.72  0.50 - 1.35 mg/dL   Calcium 9.6  8.4 - 53.6 mg/dL   GFR calc non Af Amer 81 (*) >90 mL/min   GFR calc Af Amer >90  >90 mL/min  PRO B NATRIURETIC PEPTIDE     Status: Abnormal   Collection Time  07/26/13 11:25 AM      Result Value Range   Pro B Natriuretic peptide  (BNP) 1309.0 (*) 0 - 125 pg/mL  PROTIME-INR     Status: Abnormal   Collection Time    07/26/13 11:25 AM      Result Value Range   Prothrombin Time 16.8 (*) 11.6 - 15.2 seconds   INR 1.40  0.00 - 1.49  TROPONIN I     Status: None   Collection Time    07/26/13 11:25 AM      Result Value Range   Troponin I <0.30  <0.30 ng/mL  POCT I-STAT TROPONIN I     Status: None   Collection Time    07/26/13 11:38 AM      Result Value Range   Troponin i, poc 0.06  0.00 - 0.08 ng/mL   Comment 3             Radiology/Studies: Dg Chest 2 View  07/26/2013   *RADIOLOGY REPORT*  Clinical Data: Chest pain  CHEST - 2 VIEW  Comparison: 07/11/2013  Findings: Cardiomegaly with vascular congestion.  No CHF or pneumonia.  No large effusion or pneumothorax.  Trachea midline.  IMPRESSION: Cardiomegaly with vascular congestion.  Stable exam.   Original Report Authenticated By: Judie Petit. Shick, M.D.   X-ray Chest Pa And Lateral   07/11/2013   *RADIOLOGY REPORT*  Clinical Data: Shortness of breath.  Chest pressure and chills.  CHEST - 2 VIEW  Comparison: 12/31/2012  Findings: Moderate cardiac enlargement is identified.  Pulmonary vascular congestion is present.  No pleural effusion or edema identified.  No airspace consolidation.  IMPRESSION:  1.  Moderate cardiac enlargement with pulmonary vascular congestion. 2.  No pneumonia.   Original Report Authenticated By: Signa Kell, M.D.    BJY:NWGNFAOZ atrial flutter 116 bpm.  Low voltage  Q waves III, V4 to V6  ASSESSMENT AND PLAN:   1.  CHF  Acute on chronic systolic  The patient remains fluid overloaded.  His HR is a little high on tele.  But his EKG is abnormal ( Q waves)  He has had a lot of chest discomfort over past week  I would recomm a L heart cath to define anatomy.  Patient requests that it be done in arm not leg with complication history.  If no CAD, optimize meds  Would plan for TEE with possible cardioversion  Resume coumadin.   If signif CAD would treat that and  rate control.    2.  Aflutter  As noted above  3.  Cough  Would hold ACE I and follow  4.  Sleep apnea  Need to see about getting CPAP covered.         Signed, Dietrich Pates 07/26/2013, 1:34 PM

## 2013-07-26 NOTE — ED Notes (Signed)
Pt c/o chest pressure and indigestion feeling that started Sunday. Pt states he took pepto bismol and vicodin and it gave him some relief but he can still notice the pressure there. Pt has hx of CHF, atrial flutter. Pt state he feels a little sob. Dr. Jodi Mourning at bedside. Pt states he has some diarrhea and nausea.

## 2013-07-26 NOTE — ED Notes (Signed)
Pt states his brother was just buried on Sunday and he is under a lot of stress.

## 2013-07-26 NOTE — Progress Notes (Signed)
Anti-Coagulation Progress Note  MARCELLO TUZZOLINO is a 52 y.o. male who is currently on an anti-coagulation regimen.    RECENT RESULTS: Recent results are below, the most recent result is correlated with a dose of 15 mg x 2 days; 12.5mg  x 1 day; followed by 10mg  x 1 day and INR today.Will INCREASE to 15mg  qd warfarin PO and RTC next Monday. Lab Results  Component Value Date   INR 1.6 07/26/2013   INR 1.5 07/22/2013   INR 1.2 07/19/2013    ANTI-COAG DOSE: Anticoagulation Dose Instructions as of 07/26/2013     Glynis Smiles Tue Wed Thu Fri Sat   New Dose 0 mg 0 mg 15 mg 15 mg 15 mg 15 mg 15 mg       ANTICOAG SUMMARY: Anticoagulation Episode Summary   Current INR goal 2.0-3.0  Next INR check 08/01/2013  INR from last check 1.6! (07/26/2013)  Weekly max dose   Target end date   [facility]  Preferred lab   Send INR reminders to    Indications  Atrial flutter [427.32] Long term (current) use of anticoagulants [V58.61]        Comments         ANTICOAG TODAY: Anticoagulation Summary as of 07/26/2013   INR goal 2.0-3.0  Selected INR 1.6! (07/26/2013)  Next INR check 08/01/2013  Target end date    Indications  Atrial flutter [427.32] Long term (current) use of anticoagulants [V58.61]      Anticoagulation Episode Summary   [facility]   Preferred lab    Send INR reminders to    Comments       PATIENT INSTRUCTIONS: Patient Instructions  Patient instructed to take medications as defined in the Anti-coagulation Track section of this encounter.  Patient instructed to take today's dose.  Patient verbalized understanding of these instructions.       FOLLOW-UP Return in 6 days (on 08/01/2013) for Follow up INR at 1015h.  Hulen Luster, III Pharm.D., CACP

## 2013-07-27 ENCOUNTER — Other Ambulatory Visit: Payer: Self-pay

## 2013-07-27 ENCOUNTER — Encounter (HOSPITAL_COMMUNITY)
Admission: EM | Disposition: A | Discharge status: Home Health | Payer: Self-pay | Source: Home / Self Care | Attending: Internal Medicine

## 2013-07-27 ENCOUNTER — Inpatient Hospital Stay (HOSPITAL_COMMUNITY): Payer: BC Managed Care – PPO

## 2013-07-27 DIAGNOSIS — R079 Chest pain, unspecified: Secondary | ICD-10-CM

## 2013-07-27 HISTORY — PX: LEFT HEART CATHETERIZATION WITH CORONARY ANGIOGRAM: SHX5451

## 2013-07-27 LAB — GLUCOSE, CAPILLARY
Glucose-Capillary: 116 mg/dL — ABNORMAL HIGH (ref 70–99)
Glucose-Capillary: 137 mg/dL — ABNORMAL HIGH (ref 70–99)

## 2013-07-27 LAB — LIPID PANEL
HDL: 34 mg/dL — ABNORMAL LOW (ref >39)
LDL Cholesterol: 53 mg/dL (ref 0–99)
Total CHOL/HDL Ratio: 3.4 RATIO
Triglycerides: 152 mg/dL — ABNORMAL HIGH (ref <150)
VLDL: 30 mg/dL (ref 0–40)

## 2013-07-27 LAB — CBC
MCH: 31.2 pg (ref 26.0–34.0)
MCHC: 34.9 g/dL (ref 30.0–36.0)
Platelets: 220 10*3/uL (ref 150–400)
RDW: 13.6 % (ref 11.5–15.5)

## 2013-07-27 LAB — PROTIME-INR
INR: 1.57 — ABNORMAL HIGH (ref 0.00–1.49)
Prothrombin Time: 18.3 seconds — ABNORMAL HIGH (ref 11.6–15.2)

## 2013-07-27 LAB — BASIC METABOLIC PANEL
Calcium: 8.9 mg/dL (ref 8.4–10.5)
GFR calc non Af Amer: 74 mL/min — ABNORMAL LOW (ref >90)
Sodium: 138 mEq/L (ref 135–145)

## 2013-07-27 SURGERY — LEFT HEART CATHETERIZATION WITH CORONARY ANGIOGRAM
Anesthesia: LOCAL

## 2013-07-27 MED ORDER — SODIUM CHLORIDE 0.9 % IV SOLN
INTRAVENOUS | Status: AC
  Administered 2013-07-27: 14:00:00 via INTRAVENOUS

## 2013-07-27 MED ORDER — FENTANYL CITRATE 0.05 MG/ML IJ SOLN
INTRAMUSCULAR | Status: AC
  Filled 2013-07-27: qty 2

## 2013-07-27 MED ORDER — VERAPAMIL HCL 2.5 MG/ML IV SOLN
INTRAVENOUS | Status: AC
  Filled 2013-07-27: qty 2

## 2013-07-27 MED ORDER — LIDOCAINE HCL (PF) 1 % IJ SOLN
INTRAMUSCULAR | Status: AC
  Filled 2013-07-27: qty 30

## 2013-07-27 MED ORDER — WARFARIN - PHARMACIST DOSING INPATIENT: Freq: Every day | Status: DC

## 2013-07-27 MED ORDER — NITROGLYCERIN 0.2 MG/ML ON CALL CATH LAB
INTRAVENOUS | Status: AC
  Filled 2013-07-27: qty 1

## 2013-07-27 MED ORDER — HEPARIN (PORCINE) IN NACL 2-0.9 UNIT/ML-% IJ SOLN
INTRAMUSCULAR | Status: AC
  Filled 2013-07-27: qty 1000

## 2013-07-27 MED ORDER — HEPARIN SODIUM (PORCINE) 1000 UNIT/ML IJ SOLN
INTRAMUSCULAR | Status: AC
  Filled 2013-07-27: qty 1

## 2013-07-27 MED ORDER — MIDAZOLAM HCL 2 MG/2ML IJ SOLN
INTRAMUSCULAR | Status: AC
  Filled 2013-07-27: qty 2

## 2013-07-27 MED ORDER — WARFARIN SODIUM 7.5 MG PO TABS
15.0000 mg | ORAL_TABLET | Freq: Once | ORAL | Status: AC
  Administered 2013-07-27: 15 mg via ORAL
  Filled 2013-07-27: qty 2

## 2013-07-27 NOTE — CV Procedure (Signed)
   Cardiac Catheterization Procedure Note  Name: Jeremiah Perkins MRN: [id number] DOB: 1961-01-31  Procedure: Left Heart Cath, Selective Coronary Angiography, LV angiography  Indication: 53 yo BM with nonischemic cardiomyopathy and atrial flutter presents with symptoms of chest pain.   Procedural Details: The right wrist was prepped, draped, and anesthetized with 1% lidocaine. Using the modified Seldinger technique, a 5 French sheath was introduced into the right radial artery. 3 mg of verapamil was administered through the sheath, weight-based unfractionated heparin was administered intravenously. Standard Judkins catheters were used for selective coronary angiography and left ventriculography. A Williams right catheter was used for the RCA. Catheter exchanges were performed over an exchange length guidewire. There were no immediate procedural complications. A TR band was used for radial hemostasis at the completion of the procedure.  The patient was transferred to the post catheterization recovery area for further monitoring.  Procedural Findings: Hemodynamics: AO 106/69 mean of 84 mm Hg LV 102/25 mm Hg  Coronary angiography: Coronary dominance: right  Left mainstem: Normal  Left anterior descending (LAD): Normal  Left circumflex (LCx): Normal  Right coronary artery (RCA): Normal  Left ventriculography: Left ventricular systolic function is markedly abnormal. It is moderately enlarged with severe global hypokinesis and overall EF of 20-25%. There is mild mitral insufficiency.  Final Conclusions:   1. Normal coronary anatomy. 2. Severe LV dysfunction EF 20-25%  Recommendations: Medical management.  Theron Arista [facility] 07/27/2013, 1:14 PM

## 2013-07-27 NOTE — Progress Notes (Signed)
ANTICOAGULATION CONSULT NOTE - Follow-Up Consult  Pharmacy Consult for heparin Indication: atrial fibrillation  Allergies  Allergen Reactions  . Other     Eggs - nausea/vomiting (did ok with the flu shot, seems to be an issue with the type of preparation of the egg product)  . Sulfa Antibiotics Nausea And Vomiting    Patient Measurements: Weight: 271 lb (122.925 kg) Heparin Dosing Weight: 94.5kg  Vital Signs: Temp: 98 F (36.7 C) (09/02 2100) Temp src: Oral (09/02 1700) BP: 141/90 mmHg (09/02 2100) Pulse Rate: 86 (09/02 2304)  Labs:  Recent Labs  07/26/13 1040 07/26/13 1125 07/26/13 2015 07/27/13 0100  HGB  --  15.6  --   --   HCT  --  44.3  --   --   PLT  --  263  --   --   LABPROT  --  16.8*  --   --   INR 1.6 1.40  --   --   HEPARINUNFRC  --   --   --  0.48  CREATININE  --  1.02  --   --   TROPONINI  --  <0.30 <0.30  --     The CrCl is unknown because both a height and weight (above a minimum accepted value) are required for this calculation.  Assessment: 52 yom on chronic coumadin for afib presented to the hospital with CP. INR is subtherapeutic at 1.4. On IV heparin in anticipation of cardiac cath tomorrow. Heparin level 0.48 on 1900 units/hr. No bleeding noted.   Goal of Therapy:  INR 2-3 Heparin level 0.3-0.7 units/ml Monitor platelets by anticoagulation protocol: Yes   Plan:  1. Continue heparin gtt 1900 units/hr 2. F/u a.m. Level.  Christoper Fabian, PharmD, BCPS Clinical pharmacist, pager [phone] 07/27/2013,2:30 AM

## 2013-07-27 NOTE — Progress Notes (Signed)
Pt had CPAP mask already off this morning, pt was in bathroom when I came to check CPAP.

## 2013-07-27 NOTE — Interval H&P Note (Signed)
History and Physical Interval Note:  07/27/2013 12:40 PM  Jeremiah Perkins  has presented today for surgery, with the diagnosis of angina  The various methods of treatment have been discussed with the patient and family. After consideration of risks, benefits and other options for treatment, the patient has consented to  Procedure(s): LEFT HEART CATHETERIZATION WITH CORONARY ANGIOGRAM (N/A) as a surgical intervention .  The patient's history has been reviewed, patient examined, no change in status, stable for surgery.  I have reviewed the patient's chart and labs.  Questions were answered to the patient's satisfaction.    Cath Lab Visit (complete for each Cath Lab visit)  Clinical Evaluation Leading to the Procedure:   ACS: no  Non-ACS:    Anginal Classification: CCS III  Anti-ischemic medical therapy: Minimal Therapy (1 class of medications)  Non-Invasive Test Results: No non-invasive testing performed  Prior CABG: No previous CABG       Theron Arista [facility] 07/27/2013 12:40 PM

## 2013-07-27 NOTE — H&P (View-Only) (Signed)
 Subjective:  No chest pain overnight. Remains in atrial flutter with controlled VR. For cath today.  Objective:  Vital Signs in the last 24 hours: Temp:  [97.4 F (36.3 C)-98.3 F (36.8 C)] 97.4 F (36.3 C) (09/03 0354) Pulse Rate:  [53-131] 63 (09/03 0354) Resp:  [17-27] 18 (09/03 0354) BP: (90-141)/(47-90) 92/52 mmHg (09/03 0354) SpO2:  [96 %-100 %] 97 % (09/03 0354) Weight:  [271 lb (122.925 kg)] 271 lb (122.925 kg) (09/03 0354)  Intake/Output from previous day: 09/02 0701 - 09/03 0700 In: -  Out: 600 [Urine:600] Intake/Output from this shift:    . allopurinol  300 mg Oral Daily  . aspirin  81 mg Oral Daily  . atorvastatin  40 mg Oral q1800  . carvedilol  6.25 mg Oral BID WC  . colchicine  0.6 mg Oral Daily  . diazepam  5 mg Oral On Call  . furosemide  40 mg Oral BID  . gabapentin  1,200 mg Oral QHS  . gabapentin  600 mg Oral Daily  . insulin aspart  0-15 Units Subcutaneous TID WC  . sodium chloride  3 mL Intravenous Q12H  . sodium chloride  3 mL Intravenous Q12H   . heparin 1,900 Units/hr (07/26/13 1616)    Physical Exam: The patient appears to be in no distress.  Head and neck exam reveals that the pupils are equal and reactive.  The extraocular movements are full.  There is no scleral icterus.  Mouth and pharynx are benign.  No lymphadenopathy.  No carotid bruits.  The jugular venous pressure is normal.  Thyroid is not enlarged or tender.  Chest is clear to percussion and auscultation.  No rales or rhonchi.  Expansion of the chest is symmetrical.  Heart reveals no abnormal lift or heave.  First and second heart sounds are normal.  There is no murmur gallop rub or click.  The abdomen is soft and nontender.  Bowel sounds are normoactive.  There is no hepatosplenomegaly or mass.  There are no abdominal bruits.  Extremities reveal no phlebitis or edema.  Pedal pulses are good.  There is no cyanosis or clubbing.  Neurologic exam is normal strength and no  lateralizing weakness.  No sensory deficits.  Integument reveals no rash  Lab Results:  Recent Labs  07/26/13 1125 07/27/13 0415  WBC 8.3 7.1  HGB 15.6 14.3  PLT 263 220    Recent Labs  07/26/13 1125 07/27/13 0415  NA 138 138  K 4.4 3.8  CL 100 100  CO2 26 27  GLUCOSE 116* 115*  BUN 22 25*  CREATININE 1.02 1.11    Recent Labs  07/26/13 1125 07/26/13 2015  TROPONINI <0.30 <0.30   Hepatic Function Panel  Recent Labs  07/26/13 2015  PROT 7.1  ALBUMIN 3.2*  AST 21  ALT 19  ALKPHOS 55  BILITOT 0.7  BILIDIR 0.1  IBILI 0.6    Recent Labs  07/27/13 0415  CHOL 117   No results found for this basename: PROTIME,  in the last 72 hours  Imaging: Dg Chest 2 View  07/26/2013   *RADIOLOGY REPORT*  Clinical Data: Chest pain  CHEST - 2 VIEW  Comparison: 07/11/2013  Findings: Cardiomegaly with vascular congestion.  No CHF or pneumonia.  No large effusion or pneumothorax.  Trachea midline.  IMPRESSION: Cardiomegaly with vascular congestion.  Stable exam.   Original Report Authenticated By: M. Shick, M.D.    Cardiac Studies: Telemetry shows atrial flutter with controlled VR Assessment/Plan:    1. CHF Acute on chronic systolic  I would recomm a L heart cath to define anatomy. Patient requests that it be done in arm not leg with complication history. If no CAD, optimize meds Would plan for TEE with possible cardioversion Resume coumadin.  If signif CAD would treat that and rate control.  2. Aflutter As noted above  3. Cough Would hold ACE I and follow   LOS: 1 day    Darnelle Derrick 07/27/2013, 7:41 AM    

## 2013-07-27 NOTE — Progress Notes (Signed)
ANTICOAGULATION CONSULT NOTE - Follow-Up Consult  Pharmacy Consult for Coumadin Indication: atrial fibrillation  Allergies  Allergen Reactions  . Other     Eggs - nausea/vomiting (did ok with the flu shot, seems to be an issue with the type of preparation of the egg product)  . Sulfa Antibiotics Nausea And Vomiting    Labs:  Recent Labs  07/26/13 1040 07/26/13 1125 07/26/13 2015 07/27/13 0100 07/27/13 0415  HGB  --  15.6  --   --  14.3  HCT  --  44.3  --   --  41.0  PLT  --  263  --   --  220  LABPROT  --  16.8*  --   --  18.3*  INR 1.6 1.40  --   --  1.57*  HEPARINUNFRC  --   --   --  0.48  --   CREATININE  --  1.02  --   --  1.11  TROPONINI  --  <0.30 <0.30  --   --     Estimated Creatinine Clearance: 95.6 ml/min (by C-G formula based on Cr of 1.11).  Assessment: 52 yom on chronic coumadin for afib presented to the hospital with CP. Now s/p cath - to resume Coumadin  Goal of Therapy:  INR 2-3 Heparin level 0.3-0.7 units/ml Monitor platelets by anticoagulation protocol: Yes   Plan:  1. Coumadin 15 mg po x 1 2. Daily INR  Thank you. Okey Regal, PharmD [phone] 07/27/2013,1:38 PM

## 2013-07-27 NOTE — Progress Notes (Signed)
Subjective:  No chest pain overnight. Remains in atrial flutter with controlled VR. For cath today.  Objective:  Vital Signs in the last 24 hours: Temp:  [97.4 F (36.3 C)-98.3 F (36.8 C)] 97.4 F (36.3 C) (09/03 0354) Pulse Rate:  [53-131] 63 (09/03 0354) Resp:  [17-27] 18 (09/03 0354) BP: (90-141)/(47-90) 92/52 mmHg (09/03 0354) SpO2:  [96 %-100 %] 97 % (09/03 0354) Weight:  [271 lb (122.925 kg)] 271 lb (122.925 kg) (09/03 0354)  Intake/Output from previous day: 09/02 0701 - 09/03 0700 In: -  Out: 600 [Urine:600] Intake/Output from this shift:    . allopurinol  300 mg Oral Daily  . aspirin  81 mg Oral Daily  . atorvastatin  40 mg Oral q1800  . carvedilol  6.25 mg Oral BID WC  . colchicine  0.6 mg Oral Daily  . diazepam  5 mg Oral On Call  . furosemide  40 mg Oral BID  . gabapentin  1,200 mg Oral QHS  . gabapentin  600 mg Oral Daily  . insulin aspart  0-15 Units Subcutaneous TID WC  . sodium chloride  3 mL Intravenous Q12H  . sodium chloride  3 mL Intravenous Q12H   . heparin 1,900 Units/hr (07/26/13 1616)    Physical Exam: The patient appears to be in no distress.  Head and neck exam reveals that the pupils are equal and reactive.  The extraocular movements are full.  There is no scleral icterus.  Mouth and pharynx are benign.  No lymphadenopathy.  No carotid bruits.  The jugular venous pressure is normal.  Thyroid is not enlarged or tender.  Chest is clear to percussion and auscultation.  No rales or rhonchi.  Expansion of the chest is symmetrical.  Heart reveals no abnormal lift or heave.  First and second heart sounds are normal.  There is no murmur gallop rub or click.  The abdomen is soft and nontender.  Bowel sounds are normoactive.  There is no hepatosplenomegaly or mass.  There are no abdominal bruits.  Extremities reveal no phlebitis or edema.  Pedal pulses are good.  There is no cyanosis or clubbing.  Neurologic exam is normal strength and no  lateralizing weakness.  No sensory deficits.  Integument reveals no rash  Lab Results:  Recent Labs  07/26/13 1125 07/27/13 0415  WBC 8.3 7.1  HGB 15.6 14.3  PLT 263 220    Recent Labs  07/26/13 1125 07/27/13 0415  NA 138 138  K 4.4 3.8  CL 100 100  CO2 26 27  GLUCOSE 116* 115*  BUN 22 25*  CREATININE 1.02 1.11    Recent Labs  07/26/13 1125 07/26/13 2015  TROPONINI <0.30 <0.30   Hepatic Function Panel  Recent Labs  07/26/13 2015  PROT 7.1  ALBUMIN 3.2*  AST 21  ALT 19  ALKPHOS 55  BILITOT 0.7  BILIDIR 0.1  IBILI 0.6    Recent Labs  07/27/13 0415  CHOL 117   No results found for this basename: PROTIME,  in the last 72 hours  Imaging: Dg Chest 2 View  07/26/2013   *RADIOLOGY REPORT*  Clinical Data: Chest pain  CHEST - 2 VIEW  Comparison: 07/11/2013  Findings: Cardiomegaly with vascular congestion.  No CHF or pneumonia.  No large effusion or pneumothorax.  Trachea midline.  IMPRESSION: Cardiomegaly with vascular congestion.  Stable exam.   Original Report Authenticated By: Judie Petit. Miles Costain, M.D.    Cardiac Studies: Telemetry shows atrial flutter with controlled VR Assessment/Plan:  1. CHF Acute on chronic systolic  I would recomm a L heart cath to define anatomy. Patient requests that it be done in arm not leg with complication history. If no CAD, optimize meds Would plan for TEE with possible cardioversion Resume coumadin.  If signif CAD would treat that and rate control.  2. Aflutter As noted above  3. Cough Would hold ACE I and follow   LOS: 1 day    Cassell Clement 07/27/2013, 7:41 AM

## 2013-07-27 NOTE — Progress Notes (Signed)
Utilization review completed.  

## 2013-07-28 ENCOUNTER — Encounter (HOSPITAL_COMMUNITY): Payer: Self-pay | Admitting: Anesthesiology

## 2013-07-28 ENCOUNTER — Encounter (HOSPITAL_COMMUNITY)
Admission: EM | Disposition: A | Discharge status: Home Health | Payer: BC Managed Care – PPO | Source: Home / Self Care | Attending: Internal Medicine

## 2013-07-28 ENCOUNTER — Encounter (HOSPITAL_COMMUNITY): Payer: BC Managed Care – PPO | Admitting: Anesthesiology

## 2013-07-28 DIAGNOSIS — I4891 Unspecified atrial fibrillation: Secondary | ICD-10-CM

## 2013-07-28 HISTORY — PX: TEE WITHOUT CARDIOVERSION: SHX5443

## 2013-07-28 HISTORY — PX: CARDIOVERSION: SHX1299

## 2013-07-28 LAB — HEPARIN LEVEL (UNFRACTIONATED): Heparin Unfractionated: 0.28 IU/mL — ABNORMAL LOW (ref 0.30–0.70)

## 2013-07-28 LAB — CBC
Hemoglobin: 14.1 g/dL (ref 13.0–17.0)
MCH: 30.7 pg (ref 26.0–34.0)
MCHC: 34.2 g/dL (ref 30.0–36.0)
MCV: 89.6 fL (ref 78.0–100.0)
RBC: 4.6 MIL/uL (ref 4.22–5.81)

## 2013-07-28 LAB — BASIC METABOLIC PANEL
CO2: 24 mEq/L (ref 19–32)
Chloride: 100 mEq/L (ref 96–112)
Glucose, Bld: 143 mg/dL — ABNORMAL HIGH (ref 70–99)
Sodium: 136 mEq/L (ref 135–145)

## 2013-07-28 LAB — GLUCOSE, CAPILLARY: Glucose-Capillary: 123 mg/dL — ABNORMAL HIGH (ref 70–99)

## 2013-07-28 LAB — MAGNESIUM: Magnesium: 2.2 mg/dL (ref 1.5–2.5)

## 2013-07-28 LAB — PROTIME-INR: Prothrombin Time: 17.5 seconds — ABNORMAL HIGH (ref 11.6–15.2)

## 2013-07-28 SURGERY — ECHOCARDIOGRAM, TRANSESOPHAGEAL
Anesthesia: General

## 2013-07-28 MED ORDER — LIDOCAINE HCL (CARDIAC) 20 MG/ML IV SOLN
INTRAVENOUS | Status: DC | PRN
  Administered 2013-07-28: 20 mg via INTRAVENOUS

## 2013-07-28 MED ORDER — FENTANYL CITRATE 0.05 MG/ML IJ SOLN
INTRAMUSCULAR | Status: AC
  Filled 2013-07-28: qty 2

## 2013-07-28 MED ORDER — WARFARIN SODIUM 7.5 MG PO TABS
15.0000 mg | ORAL_TABLET | Freq: Once | ORAL | Status: AC
  Administered 2013-07-28: 15 mg via ORAL
  Filled 2013-07-28: qty 2

## 2013-07-28 MED ORDER — MIDAZOLAM HCL 5 MG/ML IJ SOLN
INTRAMUSCULAR | Status: AC
  Filled 2013-07-28: qty 2

## 2013-07-28 MED ORDER — HEPARIN (PORCINE) IN NACL 100-0.45 UNIT/ML-% IJ SOLN
1950.0000 [IU]/h | INTRAMUSCULAR | Status: DC
  Administered 2013-07-28: 1950 [IU]/h via INTRAVENOUS
  Filled 2013-07-28 (×2): qty 250

## 2013-07-28 MED ORDER — SODIUM CHLORIDE 0.9 % IV SOLN: 250.0000 mL | INTRAVENOUS | Status: DC

## 2013-07-28 MED ORDER — SODIUM CHLORIDE 0.9 % IJ SOLN: 3.0000 mL | INTRAMUSCULAR | Status: DC | PRN

## 2013-07-28 MED ORDER — SODIUM CHLORIDE 0.9 % IJ SOLN
3.0000 mL | Freq: Two times a day (BID) | INTRAMUSCULAR | Status: DC
  Administered 2013-07-28 – 2013-07-30 (×3): 3 mL via INTRAVENOUS

## 2013-07-28 MED ORDER — DIPHENHYDRAMINE HCL 50 MG/ML IJ SOLN
INTRAMUSCULAR | Status: AC
  Filled 2013-07-28: qty 1

## 2013-07-28 MED ORDER — SODIUM CHLORIDE 0.45 % IV SOLN
INTRAVENOUS | Status: DC
  Administered 2013-07-28 – 2013-07-29 (×3): via INTRAVENOUS

## 2013-07-28 MED ORDER — HEPARIN (PORCINE) IN NACL 100-0.45 UNIT/ML-% IJ SOLN
2150.0000 [IU]/h | INTRAMUSCULAR | Status: DC
  Administered 2013-07-28 – 2013-07-30 (×4): 2150 [IU]/h via INTRAVENOUS
  Filled 2013-07-28 (×5): qty 250

## 2013-07-28 MED ORDER — PROPOFOL 10 MG/ML IV BOLUS
INTRAVENOUS | Status: DC | PRN
  Administered 2013-07-28: 80 mg via INTRAVENOUS

## 2013-07-28 MED ORDER — BUTAMBEN-TETRACAINE-BENZOCAINE 2-2-14 % EX AERO
INHALATION_SPRAY | CUTANEOUS | Status: DC | PRN
  Administered 2013-07-28: 2 via TOPICAL

## 2013-07-28 MED ORDER — PHENOL 1.4 % MT LIQD
1.0000 | OROMUCOSAL | Status: DC | PRN
  Administered 2013-07-28: 1 via OROMUCOSAL
  Filled 2013-07-28: qty 177

## 2013-07-28 MED ORDER — FENTANYL CITRATE 0.05 MG/ML IJ SOLN
INTRAMUSCULAR | Status: DC | PRN
  Administered 2013-07-28 (×2): 25 ug via INTRAVENOUS

## 2013-07-28 MED ORDER — MIDAZOLAM HCL 10 MG/2ML IJ SOLN
INTRAMUSCULAR | Status: DC | PRN
  Administered 2013-07-28 (×2): 2 mg via INTRAVENOUS
  Administered 2013-07-28: 1 mg via INTRAVENOUS

## 2013-07-28 NOTE — Transfer of Care (Signed)
Immediate Anesthesia Transfer of Care Note  Patient: Jeremiah Perkins  Procedure(s) Performed: Procedure(s): TRANSESOPHAGEAL ECHOCARDIOGRAM (TEE) (N/A) CARDIOVERSION (N/A)  Patient Location: PACU  Anesthesia Type:MAC  Level of Consciousness: awake, alert , oriented and sedated  Airway & Oxygen Therapy: Patient Spontanous Breathing and Patient connected to nasal cannula oxygen  Post-op Assessment: Report given to PACU RN, Post -op Vital signs reviewed and stable and Patient moving all extremities  Post vital signs: Reviewed and stable  Complications: No apparent anesthesia complications

## 2013-07-28 NOTE — Progress Notes (Signed)
ANTICOAGULATION CONSULT NOTE - Follow-Up Consult  Pharmacy Consult for Coumadin, Heparin Indication: atrial fibrillation  Allergies  Allergen Reactions  . Other     Eggs - nausea/vomiting (did ok with the flu shot, seems to be an issue with the type of preparation of the egg product)  . Sulfa Antibiotics Nausea And Vomiting    Labs:  Recent Labs  07/26/13 1125 07/26/13 2015 07/27/13 0100 07/27/13 0415 07/28/13 0446  HGB 15.6  --   --  14.3 14.1  HCT 44.3  --   --  41.0 41.2  PLT 263  --   --  220 213  LABPROT 16.8*  --   --  18.3* 17.5*  INR 1.40  --   --  1.57* 1.48  HEPARINUNFRC  --   --  0.48  --   --   CREATININE 1.02  --   --  1.11  --   TROPONINI <0.30 <0.30  --   --   --     Estimated Creatinine Clearance: 96.7 ml/min (by C-G formula based on Cr of 1.11).  Assessment: 52 yom on chronic coumadin for afib presented to the hospital with CP. Now s/p cath - to resume Coumadin and heparin until INR > 2  INR = 1.48 this AM, cardioversion/TEE planned for today  Goal of Therapy:  INR 2-3 Heparin level 0.3-0.7 units/ml Monitor platelets by anticoagulation protocol: Yes   Plan:  1. Heparin at 1950 units / hr 2. Coumadin 15 mg po x 1 dose tonight 3. 8 hr heparin level 4. Daily heparin level, CBC, INR  Thank you. Okey Regal, PharmD [phone] 07/28/2013,8:35 AM

## 2013-07-28 NOTE — Progress Notes (Addendum)
 Subjective:  No chest pain overnight. Remains in atrial flutter with controlled VR. Cath yesterday showed clean coronaries. EF 20-25%  Objective:  Vital Signs in the last 24 hours: Temp:  [97.9 F (36.6 C)-98.3 F (36.8 C)] 97.9 F (36.6 C) (09/04 0549) Pulse Rate:  [49-113] 113 (09/04 0549) Resp:  [19-20] 20 (09/04 0549) BP: (92-123)/(60-98) 106/67 mmHg (09/04 0549) SpO2:  [97 %-100 %] 100 % (09/04 0549) Weight:  [277 lb (125.646 kg)] 277 lb (125.646 kg) (09/04 0549)  Intake/Output from previous day: 09/03 0701 - 09/04 0700 In: 363 [P.O.:360; I.V.:3] Out: 1375 [Urine:1375] Intake/Output from this shift:    . allopurinol  300 mg Oral Daily  . aspirin  81 mg Oral Daily  . atorvastatin  40 mg Oral q1800  . carvedilol  6.25 mg Oral BID WC  . colchicine  0.6 mg Oral Daily  . furosemide  40 mg Oral BID  . gabapentin  1,200 mg Oral QHS  . gabapentin  600 mg Oral Daily  . insulin aspart  0-15 Units Subcutaneous TID WC  . sodium chloride  3 mL Intravenous Q12H  . sodium chloride  3 mL Intravenous Q12H  . Warfarin - Pharmacist Dosing Inpatient   Does not apply q1800   . sodium chloride    . sodium chloride      Physical Exam: The patient appears to be in no distress.  Head and neck exam reveals that the pupils are equal and reactive.  The extraocular movements are full.  There is no scleral icterus.  Mouth and pharynx are benign.  No lymphadenopathy.  No carotid bruits.  The jugular venous pressure is normal.  Thyroid is not enlarged or tender.  Chest is clear to percussion and auscultation.  No rales or rhonchi.  Expansion of the chest is symmetrical.  Heart reveals no abnormal lift or heave.  First and second heart sounds are normal.  There is no murmur gallop rub or click. Irregular pulse.  The abdomen is soft and nontender.  Bowel sounds are normoactive.  There is no hepatosplenomegaly or mass.  There are no abdominal bruits.  Extremities reveal no phlebitis or  edema.  Pedal pulses are good.  There is no cyanosis or clubbing.  Neurologic exam is normal strength and no lateralizing weakness.  No sensory deficits.  Integument reveals no rash  Lab Results:  Recent Labs  07/27/13 0415 07/28/13 0446  WBC 7.1 6.3  HGB 14.3 14.1  PLT 220 213    Recent Labs  07/26/13 1125 07/27/13 0415  NA 138 138  K 4.4 3.8  CL 100 100  CO2 26 27  GLUCOSE 116* 115*  BUN 22 25*  CREATININE 1.02 1.11    Recent Labs  07/26/13 1125 07/26/13 2015  TROPONINI <0.30 <0.30   Hepatic Function Panel  Recent Labs  07/26/13 2015  PROT 7.1  ALBUMIN 3.2*  AST 21  ALT 19  ALKPHOS 55  BILITOT 0.7  BILIDIR 0.1  IBILI 0.6    Recent Labs  07/27/13 0415  CHOL 117   No results found for this basename: PROTIME,  in the last 72 hours  Imaging: Dg Chest 2 View  07/26/2013   *RADIOLOGY REPORT*  Clinical Data: Chest pain  CHEST - 2 VIEW  Comparison: 07/11/2013  Findings: Cardiomegaly with vascular congestion.  No CHF or pneumonia.  No large effusion or pneumothorax.  Trachea midline.  IMPRESSION: Cardiomegaly with vascular congestion.  Stable exam.   Original Report Authenticated By:   M. Shick, M.D.   Us Extrem Low Left Ltd  07/28/2013   *RADIOLOGY REPORT*  Clinical Data: Fluctuant mass in the left groin/inguinal fold.  ULTRASOUND LEFT LOWER EXTREMITY LIMITED  Technique:  Ultrasound examination of the region of interest in the left lower extremity was performed.  Comparison:  None.  Findings: The subcutaneous septae are diffusely thickened.  The fat appears thickened in places.  No focal fluid collection.  No bright spectral reflectors to suggest subcutaneous emphysema.  IMPRESSION: Left groin subcutaneous edema without drainable collection.   Original Report Authenticated By: Jonathan Watts    Cardiac Studies: Telemetry shows atrial flutter with controlled VR Assessment/Plan:  1. CHF Acute on chronic systolic      EF 20-25 %. 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 Aflutter . Will proceed with TEE/Cardioversion today. Right wrist okay after cath. Restart IV heparin until INR is therapeutic.    LOS: 2 days    Jobeth Pangilinan 07/28/2013, 7:44 AM    

## 2013-07-28 NOTE — Care Management (Signed)
Case Manager did speak to pt in reference to cpap. From last hospitalization CM called [facility] and  Pt has worked out a deal with [facility] for payment of cpap once released from hospital.  Pt has [employer] unable to assist with medications. No further needs from CM at this time. Gala Lewandowsky, RN,BSN [phone]

## 2013-07-28 NOTE — Interval H&P Note (Signed)
History and Physical Interval Note:  07/28/2013 12:13 PM  Jeremiah Perkins  has presented today for surgery, with the diagnosis of aflutter  The various methods of treatment have been discussed with the patient and family. After consideration of risks, benefits and other options for treatment, the patient has consented to  Procedure(s): TRANSESOPHAGEAL ECHOCARDIOGRAM (TEE) (N/A) CARDIOVERSION (N/A) as a surgical intervention .  The patient's history has been reviewed, patient examined, no change in status, stable for surgery.  I have reviewed the patient's chart and labs.  Questions were answered to the patient's satisfaction.     Tobias Alexander, H

## 2013-07-28 NOTE — Progress Notes (Signed)
ANTICOAGULATION CONSULT NOTE - Follow Up Consult  Pharmacy Consult for Heparin Indication: atrial flutter  Allergies  Allergen Reactions  . Other     Eggs - nausea/vomiting (did ok with the flu shot, seems to be an issue with the type of preparation of the egg product)  . Sulfa Antibiotics Nausea And Vomiting    Patient Measurements: Height: 5\' 7"  (170.2 cm) Weight: 277 lb (125.646 kg) IBW/kg (Calculated) : 66.1 Heparin Dosing Weight: 95kg  Vital Signs: Temp: 98.2 F (36.8 C) (09/04 1451) Temp src: Oral (09/04 1451) BP: 116/86 mmHg (09/04 1451) Pulse Rate: 89 (09/04 1451)  Labs:  Recent Labs  07/26/13 1125 07/26/13 2015 07/27/13 0100 07/27/13 0415 07/28/13 0446 07/28/13 0855 07/28/13 1755  HGB 15.6  --   --  14.3 14.1  --   --   HCT 44.3  --   --  41.0 41.2  --   --   PLT 263  --   --  220 213  --   --   LABPROT 16.8*  --   --  18.3* 17.5*  --   --   INR 1.40  --   --  1.57* 1.48  --   --   HEPARINUNFRC  --   --  0.48  --   --   --  0.28*  CREATININE 1.02  --   --  1.11  --  1.09  --   TROPONINI <0.30 <0.30  --   --   --   --   --     Estimated Creatinine Clearance: 98.5 ml/min (by C-G formula based on Cr of 1.09).   Medications:  Heparin @ 1950 units/hr  Assessment: 52yom resumed on heparin after cath for his aflutter with plans for TEE/DCCV today. He is now s/p TEE (left atrial thrombus ruled out) and successful DCCV. First heparin level after restart is slightly below goal at 0.28. Heparin to continue until INR > 2.  Goal of Therapy:  Heparin level 0.3-0.7 units/ml Monitor platelets by anticoagulation protocol: Yes   Plan:  1) Increase heparin to 2150 units/hr 2) Check 6 hour heparin level after rate change  Fredrik Rigger 07/28/2013,6:53 PM

## 2013-07-28 NOTE — H&P (View-Only) (Signed)
Subjective:  No chest pain overnight. Remains in atrial flutter with controlled VR. Cath yesterday showed clean coronaries. EF 20-25%  Objective:  Vital Signs in the last 24 hours: Temp:  [97.9 F (36.6 C)-98.3 F (36.8 C)] 97.9 F (36.6 C) (09/04 0549) Pulse Rate:  [49-113] 113 (09/04 0549) Resp:  [19-20] 20 (09/04 0549) BP: (92-123)/(60-98) 106/67 mmHg (09/04 0549) SpO2:  [97 %-100 %] 100 % (09/04 0549) Weight:  [277 lb (125.646 kg)] 277 lb (125.646 kg) (09/04 0549)  Intake/Output from previous day: 09/03 0701 - 09/04 0700 In: 363 [P.O.:360; I.V.:3] Out: 1375 [Urine:1375] Intake/Output from this shift:    . allopurinol  300 mg Oral Daily  . aspirin  81 mg Oral Daily  . atorvastatin  40 mg Oral q1800  . carvedilol  6.25 mg Oral BID WC  . colchicine  0.6 mg Oral Daily  . furosemide  40 mg Oral BID  . gabapentin  1,200 mg Oral QHS  . gabapentin  600 mg Oral Daily  . insulin aspart  0-15 Units Subcutaneous TID WC  . sodium chloride  3 mL Intravenous Q12H  . sodium chloride  3 mL Intravenous Q12H  . Warfarin - Pharmacist Dosing Inpatient   Does not apply q1800   . sodium chloride    . sodium chloride      Physical Exam: The patient appears to be in no distress.  Head and neck exam reveals that the pupils are equal and reactive.  The extraocular movements are full.  There is no scleral icterus.  Mouth and pharynx are benign.  No lymphadenopathy.  No carotid bruits.  The jugular venous pressure is normal.  Thyroid is not enlarged or tender.  Chest is clear to percussion and auscultation.  No rales or rhonchi.  Expansion of the chest is symmetrical.  Heart reveals no abnormal lift or heave.  First and second heart sounds are normal.  There is no murmur gallop rub or click. Irregular pulse.  The abdomen is soft and nontender.  Bowel sounds are normoactive.  There is no hepatosplenomegaly or mass.  There are no abdominal bruits.  Extremities reveal no phlebitis or  edema.  Pedal pulses are good.  There is no cyanosis or clubbing.  Neurologic exam is normal strength and no lateralizing weakness.  No sensory deficits.  Integument reveals no rash  Lab Results:  Recent Labs  07/27/13 0415 07/28/13 0446  WBC 7.1 6.3  HGB 14.3 14.1  PLT 220 213    Recent Labs  07/26/13 1125 07/27/13 0415  NA 138 138  K 4.4 3.8  CL 100 100  CO2 26 27  GLUCOSE 116* 115*  BUN 22 25*  CREATININE 1.02 1.11    Recent Labs  07/26/13 1125 07/26/13 2015  TROPONINI <0.30 <0.30   Hepatic Function Panel  Recent Labs  07/26/13 2015  PROT 7.1  ALBUMIN 3.2*  AST 21  ALT 19  ALKPHOS 55  BILITOT 0.7  BILIDIR 0.1  IBILI 0.6    Recent Labs  07/27/13 0415  CHOL 117   No results found for this basename: PROTIME,  in the last 72 hours  Imaging: Dg Chest 2 View  07/26/2013   *RADIOLOGY REPORT*  Clinical Data: Chest pain  CHEST - 2 VIEW  Comparison: 07/11/2013  Findings: Cardiomegaly with vascular congestion.  No CHF or pneumonia.  No large effusion or pneumothorax.  Trachea midline.  IMPRESSION: Cardiomegaly with vascular congestion.  Stable exam.   Original Report Authenticated By:  M. Miles Costain, M.D.   [place] Extrem Low Left Ltd  07/28/2013   *RADIOLOGY REPORT*  Clinical Data: Fluctuant mass in the left groin/inguinal fold.  ULTRASOUND LEFT LOWER EXTREMITY LIMITED  Technique:  Ultrasound examination of the region of interest in the left lower extremity was performed.  Comparison:  None.  Findings: The subcutaneous septae are diffusely thickened.  The fat appears thickened in places.  No focal fluid collection.  No bright spectral reflectors to suggest subcutaneous emphysema.  IMPRESSION: Left groin subcutaneous edema without drainable collection.   Original Report Authenticated By: Tiburcio Pea    Cardiac Studies: Telemetry shows atrial flutter with controlled VR Assessment/Plan:  1. CHF Acute on chronic systolic      EF 16-10 %. 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 Aflutter . Will proceed with TEE/Cardioversion today. Right wrist okay after cath. Restart IV heparin until INR is therapeutic.    LOS: 2 days    Jeremiah Perkins 07/28/2013, 7:44 AM

## 2013-07-28 NOTE — Procedures (Signed)
Electrical Cardioversion Procedure Note Jeremiah Perkins [id number] July 04, 1961  Procedure: Electrical Cardioversion Indications:  Atrial Flutter, the patient is on Heparin drip and TEE ruled out left atrial thrombus  Procedure Details Consent: Risks of procedure as well as the alternatives and risks of each were explained to the (patient/caregiver).  Consent for procedure obtained. Time Out: Verified patient identification, verified procedure, site/side was marked, verified correct patient position, special equipment/implants available, medications/allergies/relevent history reviewed, required imaging and test results available.  Performed  Patient placed on cardiac monitor, pulse oximetry, supplemental oxygen as necessary.  Sedation given: Propofol per ansthesia Pacer pads placed anterior and posterior chest.  Cardioverted 1 time(s).  Cardioverted at 200J.  Evaluation Findings: Post procedure EKG shows: NSR Complications: None Patient did tolerate procedure well.   Tobias Alexander, H 07/28/2013, 12:23 PM

## 2013-07-28 NOTE — CV Procedure (Signed)
TEE performed prior to the cardioversion for for atrial fibrillation. The patient was consented, time out was performed. Moderate sedation was obtained with Fentanyl 50 mcg and Versed 5 mg iv. There were no complications during and after the procedure. The patient tolerated the procedure well. No complications. The patient was monitored throughout the procedure and all vitals were stable

## 2013-07-28 NOTE — Progress Notes (Signed)
  Echocardiogram Echocardiogram Transesophageal has been performed.  Jeremiah Perkins 07/28/2013, 12:24 PM

## 2013-07-29 ENCOUNTER — Encounter (HOSPITAL_COMMUNITY): Payer: Self-pay | Admitting: Cardiology

## 2013-07-29 LAB — GLUCOSE, CAPILLARY
Glucose-Capillary: 137 mg/dL — ABNORMAL HIGH (ref 70–99)
Glucose-Capillary: 135 mg/dL — ABNORMAL HIGH (ref 70–99)

## 2013-07-29 LAB — CBC
HCT: 41.1 % (ref 39.0–52.0)
MCH: 31.3 pg (ref 26.0–34.0)
MCV: 90.5 fL (ref 78.0–100.0)
Platelets: 256 10*3/uL (ref 150–400)
RBC: 4.54 MIL/uL (ref 4.22–5.81)

## 2013-07-29 LAB — HEPARIN LEVEL (UNFRACTIONATED): Heparin Unfractionated: 0.45 IU/mL (ref 0.30–0.70)

## 2013-07-29 MED ORDER — BISMUTH SUBSALICYLATE 262 MG/15ML PO SUSP
30.0000 mL | ORAL | Status: DC | PRN
  Filled 2013-07-29: qty 236

## 2013-07-29 MED ORDER — SIMETHICONE 80 MG PO CHEW
80.0000 mg | CHEWABLE_TABLET | Freq: Four times a day (QID) | ORAL | Status: DC | PRN
  Administered 2013-07-29 – 2013-07-31 (×2): 80 mg via ORAL
  Filled 2013-07-29 (×3): qty 1

## 2013-07-29 MED ORDER — PANTOPRAZOLE SODIUM 40 MG PO TBEC
40.0000 mg | DELAYED_RELEASE_TABLET | Freq: Every day | ORAL | Status: DC
  Administered 2013-07-29 – 2013-07-31 (×3): 40 mg via ORAL
  Filled 2013-07-29 (×3): qty 1

## 2013-07-29 MED ORDER — FUROSEMIDE 40 MG PO TABS
40.0000 mg | ORAL_TABLET | Freq: Every day | ORAL | Status: DC
  Administered 2013-07-29: 40 mg via ORAL
  Filled 2013-07-29 (×2): qty 1

## 2013-07-29 MED ORDER — WARFARIN SODIUM 7.5 MG PO TABS
17.5000 mg | ORAL_TABLET | Freq: Once | ORAL | Status: AC
  Administered 2013-07-29: 18:00:00 17.5 mg via ORAL
  Filled 2013-07-29: qty 1

## 2013-07-29 NOTE — Progress Notes (Signed)
Patient placed himself on cpap via full face mask. Patient is tolerating cpap well at this time.

## 2013-07-29 NOTE — Progress Notes (Signed)
ANTICOAGULATION CONSULT NOTE - Follow Up Consult  Pharmacy Consult for Heparin and Coumadin Indication: atrial flutter  Allergies  Allergen Reactions  . Other     Eggs - nausea/vomiting (did ok with the flu shot, seems to be an issue with the type of preparation of the egg product)  . Sulfa Antibiotics Nausea And Vomiting    Patient Measurements: Height: 5\' 7"  (170.2 cm) Weight: 277 lb (125.646 kg) IBW/kg (Calculated) : 66.1 Heparin Dosing Weight: 95kg  Vital Signs: Temp: 98.4 F (36.9 C) (09/04 1955) Temp src: Oral (09/04 1955) BP: 126/88 mmHg (09/04 1955) Pulse Rate: 80 (09/04 1955)  Labs:  Recent Labs  07/26/13 1125 07/26/13 2015 07/27/13 0100 07/27/13 0415 07/28/13 0446 07/28/13 0855 07/28/13 1755 07/29/13 0125  HGB 15.6  --   --  14.3 14.1  --   --  14.2  HCT 44.3  --   --  41.0 41.2  --   --  41.1  PLT 263  --   --  220 213  --   --  256  LABPROT 16.8*  --   --  18.3* 17.5*  --   --  17.9*  INR 1.40  --   --  1.57* 1.48  --   --  1.52*  HEPARINUNFRC  --   --  0.48  --   --   --  0.28* 0.45  CREATININE 1.02  --   --  1.11  --  1.09  --   --   TROPONINI <0.30 <0.30  --   --   --   --   --   --     Estimated Creatinine Clearance: 98.5 ml/min (by C-G formula based on Cr of 1.09).   Medications:  Heparin @ 1950 units/hr  Assessment: 52yom on heparin bridge to coumadin s/p TEE (left atrial thrombus ruled out) and successful DCCV. Heparin level therapeutic (0.45). INR 1.52 - not much movement past first 2 doses of coumadin. Heparin to continue until INR > 2. CBC stable. No bleeding noted.  Goal of Therapy:  INR 2-3 Heparin level 0.3-0.7 units/ml Monitor platelets by anticoagulation protocol: Yes   Plan:  1) Continue heparin at 2150 units/hr 2) Coumadin 17.5mg  po today 3) F/u daily heparin level, CBC, INR  Christoper Fabian, PharmD, BCPS Clinical pharmacist, pager [phone] 07/29/2013,2:53 AM

## 2013-07-29 NOTE — Progress Notes (Signed)
Subjective:  Patient had successful TEE cardioversion yesterday. Remains in NSR Cath 07/27/13 showed clean coronaries. EF 20-25% BP remains soft.  Objective:  Vital Signs in the last 24 hours: Temp:  [97.8 F (36.6 C)-99 F (37.2 C)] 99 F (37.2 C) (09/05 0630) Pulse Rate:  [70-99] 70 (09/05 0630) Resp:  [18-41] 18 (09/05 0630) BP: (90-152)/(23-100) 90/57 mmHg (09/05 0630) SpO2:  [96 %-100 %] 99 % (09/05 0630) Weight:  [276 lb 11.2 oz (125.51 kg)] 276 lb 11.2 oz (125.51 kg) (09/05 0630)  Intake/Output from previous day: 09/04 0701 - 09/05 0700 In: 2494.8 [P.O.:240; I.V.:2254.8] Out: 700 [Urine:700] Intake/Output from this shift:    . allopurinol  300 mg Oral Daily  . aspirin  81 mg Oral Daily  . atorvastatin  40 mg Oral q1800  . carvedilol  6.25 mg Oral BID WC  . colchicine  0.6 mg Oral Daily  . furosemide  40 mg Oral BID  . gabapentin  1,200 mg Oral QHS  . gabapentin  600 mg Oral Daily  . insulin aspart  0-15 Units Subcutaneous TID WC  . sodium chloride  3 mL Intravenous Q12H  . sodium chloride  3 mL Intravenous Q12H  . warfarin  17.5 mg Oral ONCE-1800  . Warfarin - Pharmacist Dosing Inpatient   Does not apply q1800   . sodium chloride 50 mL/hr at 07/29/13 0018  . sodium chloride    . heparin 2,150 Units/hr (07/29/13 0018)    Physical Exam: The patient appears to be in no distress.  Head and neck exam reveals that the pupils are equal and reactive.  The extraocular movements are full.  There is no scleral icterus.  Mouth and pharynx are benign.  No lymphadenopathy.  No carotid bruits.  The jugular venous pressure is normal.  Thyroid is not enlarged or tender.  Chest is clear to percussion and auscultation.  No rales or rhonchi.  Expansion of the chest is symmetrical.  Heart reveals no abnormal lift or heave.  First and second heart sounds are normal.  There is no murmur gallop rub or click. NSR.  The abdomen is soft and nontender.  Bowel sounds are normoactive.   There is no hepatosplenomegaly or mass.  There are no abdominal bruits.  Extremities reveal no phlebitis or edema.  Pedal pulses are good.  There is no cyanosis or clubbing.  Neurologic exam is normal strength and no lateralizing weakness.  No sensory deficits.  Integument reveals no rash  Lab Results:  Recent Labs  07/28/13 0446 07/29/13 0125  WBC 6.3 9.4  HGB 14.1 14.2  PLT 213 256    Recent Labs  07/27/13 0415 07/28/13 0855  NA 138 136  K 3.8 4.1  CL 100 100  CO2 27 24  GLUCOSE 115* 143*  BUN 25* 20  CREATININE 1.11 1.09    Recent Labs  07/26/13 1125 07/26/13 2015  TROPONINI <0.30 <0.30   Hepatic Function Panel  Recent Labs  07/26/13 2015  PROT 7.1  ALBUMIN 3.2*  AST 21  ALT 19  ALKPHOS 55  BILITOT 0.7  BILIDIR 0.1  IBILI 0.6    Recent Labs  07/27/13 0415  CHOL 117   No results found for this basename: PROTIME,  in the last 72 hours  Imaging: [place] Extrem Low Left Ltd  07/28/2013   *RADIOLOGY REPORT*  Clinical Data: Fluctuant mass in the left groin/inguinal fold.  ULTRASOUND LEFT LOWER EXTREMITY LIMITED  Technique:  Ultrasound examination of the region of  interest in the left lower extremity was performed.  Comparison:  None.  Findings: The subcutaneous septae are diffusely thickened.  The fat appears thickened in places.  No focal fluid collection.  No bright spectral reflectors to suggest subcutaneous emphysema.  IMPRESSION: Left groin subcutaneous edema without drainable collection.   Original Report Authenticated By: Tiburcio Pea    Cardiac Studies: Telemetry shows atrial flutter with controlled VR Assessment/Plan:  1. CHF Acute on chronic systolic      EF 16-10 %. 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 Aflutter . Maintaining NSR.  INR still subtherapeutic.  Plan: Continue IV heparin bridging. Possibly home Saturday if INR okay. Ambulate with help of Rehab. Reduce lasix to 40 mg daily.  LOS: 3 days    Cassell Clement 07/29/2013, 8:15  AM

## 2013-07-29 NOTE — Progress Notes (Signed)
CARDIAC REHAB PHASE I   PRE:  Rate/Rhythm: 74 SR    BP: sitting 110/80    SaO2: 98 RA  MODE:  Ambulation: 550 ft   POST:  Rate/Rhythm: 76 SR    BP: sitting 130/94     SaO2: 96 RA  Pt frustrated that he is in hospital. Dealing with depression. Very SOB walking but determined to walk far. Rest x3. SaO2 good. BP up after walk. Ed completed. Pt is beginning to grasp diet but it seems very hard for him to restrict sodium. Understands daily wts. Encouraged ex and pt is interested in [facility]. Will send referral to [place].  [id number]   Elissa Lovett [place] CES, ACSM 07/29/2013 3:09 PM

## 2013-07-30 LAB — CBC
Hemoglobin: 14.3 g/dL (ref 13.0–17.0)
RBC: 4.62 MIL/uL (ref 4.22–5.81)
WBC: 8.9 10*3/uL (ref 4.0–10.5)

## 2013-07-30 LAB — PROTIME-INR
INR: 1.53 — ABNORMAL HIGH (ref 0.00–1.49)
Prothrombin Time: 18 seconds — ABNORMAL HIGH (ref 11.6–15.2)

## 2013-07-30 LAB — HEPARIN LEVEL (UNFRACTIONATED): Heparin Unfractionated: 0.65 IU/mL (ref 0.30–0.70)

## 2013-07-30 MED ORDER — RIVAROXABAN 20 MG PO TABS
20.0000 mg | ORAL_TABLET | Freq: Every day | ORAL | Status: DC
  Administered 2013-07-30: 20 mg via ORAL
  Filled 2013-07-30 (×2): qty 1

## 2013-07-30 MED ORDER — LISINOPRIL 2.5 MG PO TABS
2.5000 mg | ORAL_TABLET | Freq: Every day | ORAL | Status: DC
  Administered 2013-07-30 – 2013-07-31 (×2): 2.5 mg via ORAL
  Filled 2013-07-30 (×2): qty 1

## 2013-07-30 MED ORDER — FUROSEMIDE 80 MG PO TABS
80.0000 mg | ORAL_TABLET | Freq: Every day | ORAL | Status: DC
  Administered 2013-07-30 – 2013-07-31 (×2): 80 mg via ORAL
  Filled 2013-07-30 (×2): qty 1

## 2013-07-30 MED ORDER — MAGNESIUM HYDROXIDE 400 MG/5ML PO SUSP
30.0000 mL | Freq: Every day | ORAL | Status: DC | PRN
  Administered 2013-07-30 – 2013-07-31 (×2): 30 mL via ORAL
  Filled 2013-07-30 (×2): qty 30

## 2013-07-30 NOTE — Progress Notes (Signed)
ANTICOAGULATION CONSULT NOTE - Follow Up Consult  Pharmacy Consult for xarelto Indication: atrial flutter  Allergies  Allergen Reactions  . Other     Eggs - nausea/vomiting (did ok with the flu shot, seems to be an issue with the type of preparation of the egg product)  . Sulfa Antibiotics Nausea And Vomiting    Patient Measurements: Height: 5\' 7"  (170.2 cm) Weight: 280 lb 10.3 oz (127.3 kg) IBW/kg (Calculated) : 66.1 Heparin Dosing Weight: 95kg  Vital Signs: Temp: 98.6 F (37 C) (09/06 0500) Temp src: Oral (09/06 0500) BP: 125/72 mmHg (09/06 0753) Pulse Rate: 73 (09/06 0753)  Labs:  Recent Labs  07/28/13 0446 07/28/13 0855 07/28/13 1755 07/29/13 0125 07/30/13 0545  HGB 14.1  --   --  14.2 14.3  HCT 41.2  --   --  41.1 41.7  PLT 213  --   --  256 206  LABPROT 17.5*  --   --  17.9* 18.0*  INR 1.48  --   --  1.52* 1.53*  HEPARINUNFRC  --   --  0.28* 0.45 0.65  CREATININE  --  1.09  --   --   --     Estimated Creatinine Clearance: 99.3 ml/min (by C-G formula based on Cr of 1.09).  Assessment: 52yom previously on heparin bridge to coumadin s/p TEE (left atrial thrombus ruled out) and successful DCCV. INR has had minimal movement with very high doses of coumadin. Changing to xarelto for anticoagulation. CBC WNL, no bleeding noted  Goal of Therapy:  Therapeutic anticoagulation   Plan:  1. Xarelto 20mg  PO daily with supper 2. F/u signs/symptoms of bleeding and renal fxn  Lysle Pearl, PharmD, BCPS Pager # [phone] 07/30/2013 8:45 AM

## 2013-07-30 NOTE — Progress Notes (Signed)
Patient placed himself on cpap sometime during night. RT will continue to monitor.

## 2013-07-30 NOTE — Progress Notes (Signed)
Case Management has seen the pt re Xarelto prescription.  Upon d/c, pt will need two separate prescriptions for Xarelto.  One of these is for the 30-day free trial, the other is the normal prescription he gets at d/c.  Thank you.

## 2013-07-30 NOTE — Progress Notes (Signed)
   CARE MANAGEMENT NOTE 07/30/2013  Patient:  ALBION, WEATHERHOLTZ   Account Number:  [id number]  Date Initiated:  07/30/2013  Documentation initiated by:  [facility]  Subjective/Objective Assessment:   adm: CHF and A-flutter     Action/Plan:   Anticipated DC Date:  07/31/2013   Anticipated DC Plan:  HOME/SELF CARE      DC Planning Services  CM consult      Choice offered to / List presented to:             Status of service:  Completed, signed off Medicare Important Message given?   (If response is "NO", the following Medicare IM given date fields will be blank) Date Medicare IM given:   Date Additional Medicare IM given:    Discharge Disposition:  HOME/SELF CARE  Per UR Regulation:    If discussed at Long Length of Stay Meetings, dates discussed:    Comments:  07/30/13 16:00 CM met with pt in room and gave pt a 30-day free trial Xarelto card and a $5. co-pay card.  Pt has internet access and feels comfortable activating his cards via internet.  Pt's [name], [phone], was called to check on availability.  HT pharmacy states 20mg  tabs are available.  Information relayed to pt.  No other CM needs were communicated.  Freddy Jaksch, BSN, CM

## 2013-07-30 NOTE — Progress Notes (Signed)
Subjective:  Patient had successful TEE cardioversion on 07/28/13. Remains in NSR Cath 07/27/13 showed clean coronaries. EF 20-25% BP has improved.  Will add low dose lisinopril.  Objective:  Vital Signs in the last 24 hours: Temp:  [98.6 F (37 C)-98.7 F (37.1 C)] 98.6 F (37 C) (09/06 0500) Pulse Rate:  [68-73] 73 (09/06 0753) Resp:  [18-24] 24 (09/06 0500) BP: (96-125)/(57-75) 125/72 mmHg (09/06 0753) SpO2:  [99 %-100 %] 99 % (09/06 0500) Weight:  [280 lb 10.3 oz (127.3 kg)] 280 lb 10.3 oz (127.3 kg) (09/06 0500)  Intake/Output from previous day: 09/05 0701 - 09/06 0700 In: 1624 [P.O.:480; I.V.:1144] Out: 1400 [Urine:1400] Intake/Output from this shift:    . allopurinol  300 mg Oral Daily  . aspirin  81 mg Oral Daily  . atorvastatin  40 mg Oral q1800  . carvedilol  6.25 mg Oral BID WC  . colchicine  0.6 mg Oral Daily  . furosemide  80 mg Oral Daily  . gabapentin  1,200 mg Oral QHS  . gabapentin  600 mg Oral Daily  . insulin aspart  0-15 Units Subcutaneous TID WC  . lisinopril  2.5 mg Oral Daily  . pantoprazole  40 mg Oral Daily  . sodium chloride  3 mL Intravenous Q12H  . sodium chloride  3 mL Intravenous Q12H  . Warfarin - Pharmacist Dosing Inpatient   Does not apply q1800   . sodium chloride 50 mL/hr at 07/29/13 1954  . sodium chloride    . heparin 2,150 Units/hr (07/30/13 2956)    Physical Exam: The patient appears to be in no distress.  Head and neck exam reveals that the pupils are equal and reactive.  The extraocular movements are full.  There is no scleral icterus.  Mouth and pharynx are benign.  No lymphadenopathy.  No carotid bruits.  The jugular venous pressure is normal.  Thyroid is not enlarged or tender.  Chest is clear to percussion and auscultation.  No rales or rhonchi.  Expansion of the chest is symmetrical.  Heart reveals no abnormal lift or heave.  First and second heart sounds are normal.  There is no murmur gallop rub or click. NSR.  The  abdomen is soft and nontender.  Bowel sounds are normoactive.  There is no hepatosplenomegaly or mass.  There are no abdominal bruits.  Extremities reveal mild edema.  Pedal pulses are good.  There is no cyanosis or clubbing.  Neurologic exam is normal strength and no lateralizing weakness.  No sensory deficits.  Integument reveals no rash  Lab Results:  Recent Labs  07/29/13 0125 07/30/13 0545  WBC 9.4 8.9  HGB 14.2 14.3  PLT 256 206    Recent Labs  07/28/13 0855  NA 136  K 4.1  CL 100  CO2 24  GLUCOSE 143*  BUN 20  CREATININE 1.09   No results found for this basename: TROPONINI, CK, MB,  in the last 72 hours Hepatic Function Panel No results found for this basename: PROT, ALBUMIN, AST, ALT, ALKPHOS, BILITOT, BILIDIR, IBILI,  in the last 72 hours No results found for this basename: CHOL,  in the last 72 hours No results found for this basename: PROTIME,  in the last 72 hours  Imaging: No results found.  Cardiac Studies: Telemetry shows atrial flutter with controlled VR Assessment/Plan:  1. CHF Acute on chronic systolic      EF 21-30 %. 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 Aflutter . Maintaining NSR.  INR still subtherapeutic.  Plan: Patient is resistant to warfarin. Will switch to xarelto. 9 lb weight gain over past several days.  Will increase lasix to 80 mg daily. Add lisinopril low dose (BP soft). Hopefully home tomorrow.   LOS: 4 days    Cassell Clement 07/30/2013, 8:11 AM

## 2013-07-30 NOTE — Progress Notes (Signed)
CARDIAC REHAB PHASE I   PRE:  Rate/Rhythm: 66 sinus   BP:  Sitting: 118/64   SaO2: 100 RA  MODE:  Ambulation: 900 ft   POST:  Rate/Rhythem: 73 sinus  BP:  Sitting: 124/60     SaO2: 100 RA  Pt ambulated 900 ft with assist x1.  Pt tolerated walk well without complaints.  Pt had no follow up questions from education.  We will follow up on Monday, pt encouraged to continue to walk. Fabio Pierce, MA, ACSM RCEP [phone]  Hazle Nordmann

## 2013-07-31 ENCOUNTER — Other Ambulatory Visit: Payer: Self-pay | Admitting: Physician Assistant

## 2013-07-31 DIAGNOSIS — R05 Cough: Secondary | ICD-10-CM

## 2013-07-31 DIAGNOSIS — Z79899 Other long term (current) drug therapy: Secondary | ICD-10-CM

## 2013-07-31 DIAGNOSIS — I5023 Acute on chronic systolic (congestive) heart failure: Secondary | ICD-10-CM

## 2013-07-31 DIAGNOSIS — Z5181 Encounter for therapeutic drug level monitoring: Secondary | ICD-10-CM

## 2013-07-31 DIAGNOSIS — R1909 Other intra-abdominal and pelvic swelling, mass and lump: Secondary | ICD-10-CM

## 2013-07-31 LAB — CBC
HCT: 36.5 % — ABNORMAL LOW (ref 39.0–52.0)
MCV: 90.8 fL (ref 78.0–100.0)
RBC: 4.02 MIL/uL — ABNORMAL LOW (ref 4.22–5.81)
WBC: 6.1 10*3/uL (ref 4.0–10.5)

## 2013-07-31 LAB — BASIC METABOLIC PANEL
CO2: 25 mEq/L (ref 19–32)
Glucose, Bld: 165 mg/dL — ABNORMAL HIGH (ref 70–99)
Potassium: 4.3 mEq/L (ref 3.5–5.1)
Sodium: 135 mEq/L (ref 135–145)

## 2013-07-31 LAB — GLUCOSE, CAPILLARY: Glucose-Capillary: 128 mg/dL — ABNORMAL HIGH (ref 70–99)

## 2013-07-31 MED ORDER — LEVALBUTEROL TARTRATE 45 MCG/ACT IN AERO: 1.0000 | INHALATION_SPRAY | RESPIRATORY_TRACT | Status: DC | PRN

## 2013-07-31 MED ORDER — LISINOPRIL 5 MG PO TABS: 2.5000 mg | ORAL_TABLET | Freq: Every day | ORAL | Status: DC

## 2013-07-31 MED ORDER — RIVAROXABAN 20 MG PO TABS: 20.0000 mg | ORAL_TABLET | Freq: Every day | ORAL | Status: DC

## 2013-07-31 MED ORDER — ATORVASTATIN CALCIUM 40 MG PO TABS: 40.0000 mg | ORAL_TABLET | Freq: Every day | ORAL | Status: DC

## 2013-07-31 MED ORDER — PANTOPRAZOLE SODIUM 40 MG PO TBEC: 40.0000 mg | DELAYED_RELEASE_TABLET | Freq: Every day | ORAL | Status: DC

## 2013-07-31 MED ORDER — FUROSEMIDE 80 MG PO TABS: 80.0000 mg | ORAL_TABLET | Freq: Every day | ORAL | Status: DC

## 2013-07-31 MED ORDER — LOSARTAN POTASSIUM 25 MG PO TABS: 25.0000 mg | ORAL_TABLET | Freq: Every day | ORAL | Status: DC

## 2013-07-31 NOTE — Progress Notes (Signed)
Subjective:  Patient had successful TEE cardioversion on 07/28/13. Remains in NSR Cath 07/27/13 showed clean coronaries. EF 20-25% Tolerating low dose lisinopril.  Blood pressure still low normal.  Electrolytes stable on ACE inhibitor  Objective:  Vital Signs in the last 24 hours: Temp:  [97.8 F (36.6 C)-98.7 F (37.1 C)] 98 F (36.7 C) (09/07 0544) Pulse Rate:  [63-65] 63 (09/07 0544) Resp:  [17-20] 20 (09/07 0544) BP: (101-121)/(59-78) 101/60 mmHg (09/07 0948) SpO2:  [98 %-100 %] 98 % (09/07 0544)  Intake/Output from previous day: 09/06 0701 - 09/07 0700 In: 600 [P.O.:600] Out: 600 [Urine:600] Intake/Output from this shift:    . allopurinol  300 mg Oral Daily  . atorvastatin  40 mg Oral q1800  . carvedilol  6.25 mg Oral BID WC  . colchicine  0.6 mg Oral Daily  . furosemide  80 mg Oral Daily  . gabapentin  1,200 mg Oral QHS  . gabapentin  600 mg Oral Daily  . insulin aspart  0-15 Units Subcutaneous TID WC  . lisinopril  2.5 mg Oral Daily  . pantoprazole  40 mg Oral Daily  . rivaroxaban  20 mg Oral Q supper  . sodium chloride  3 mL Intravenous Q12H  . sodium chloride  3 mL Intravenous Q12H   . sodium chloride 50 mL/hr at 07/29/13 1954  . sodium chloride      Physical Exam: The patient appears to be in no distress.  Head and neck exam reveals that the pupils are equal and reactive.  The extraocular movements are full.  There is no scleral icterus.  Mouth and pharynx are benign.  No lymphadenopathy.  No carotid bruits.  The jugular venous pressure is normal.  Thyroid is not enlarged or tender.  Chest is clear to percussion and auscultation.  No rales or rhonchi.  Expansion of the chest is symmetrical.  Heart reveals no abnormal lift or heave.  First and second heart sounds are normal.  There is no murmur gallop rub or click. NSR.  The abdomen is soft and nontender.  Bowel sounds are normoactive.  There is no hepatosplenomegaly or mass.  There are no abdominal  bruits.  Extremities reveal mild edema.  Pedal pulses are good.  There is no cyanosis or clubbing.  Neurologic exam is normal strength and no lateralizing weakness.  No sensory deficits.  Integument reveals no rash  Lab Results:  Recent Labs  07/30/13 0545 07/31/13 0512  WBC 8.9 6.1  HGB 14.3 12.3*  PLT 206 199    Recent Labs  07/31/13 0945  NA 135  K 4.3  CL 100  CO2 25  GLUCOSE 165*  BUN 15  CREATININE 1.00   No results found for this basename: TROPONINI, CK, MB,  in the last 72 hours Hepatic Function Panel No results found for this basename: PROT, ALBUMIN, AST, ALT, ALKPHOS, BILITOT, BILIDIR, IBILI,  in the last 72 hours No results found for this basename: CHOL,  in the last 72 hours No results found for this basename: PROTIME,  in the last 72 hours  Imaging: No results found.  Cardiac Studies: Telemetry shows normal sinus rhythm since TEE cardioversion Assessment/Plan:  1. CHF Acute on chronic systolic      EF 40-98 %. 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 Aflutter . Maintaining NSR.   Plan: Discharge home today on Xarelto.  He will need two prescriptions for Xarelto and he has a card so that he can afford it.  We  will ask care management to help provide for him and he home blood pressure cuff and a scale to weigh himself each day.  He should return for transition of care followup visit within a week.  I told him when he returns for his followup visit I can prepare a letter for him for his attorney in regard to his application for medical disability.  LOS: 5 days    Cassell Clement 07/31/2013, 11:32 AM

## 2013-07-31 NOTE — Progress Notes (Signed)
   CARE MANAGEMENT NOTE 07/31/2013  Patient:  Jeremiah Perkins, Jeremiah Perkins   Account Number:  [id number]  Date Initiated:  07/30/2013  Documentation initiated by:  [facility]  Subjective/Objective Assessment:   adm: CHF and A-flutter     Action/Plan:   Anticipated DC Date:  07/31/2013   Anticipated DC Plan:  HOME W HOME HEALTH SERVICES      DC Planning Services  CM consult      [facility] Choice  HOME HEALTH   Choice offered to / List presented to:  C-1 Patient   DME arranged  OTHER - SEE COMMENT        HH arranged  HH-1 RN  HH-10 DISEASE MANAGEMENT      HH agency  [name]   Status of service:  Completed, signed off Medicare Important Message given?   (If response is "NO", the following Medicare IM given date fields will be blank) Date Medicare IM given:   Date Additional Medicare IM given:    Discharge Disposition:  HOME W HOME HEALTH SERVICES  Per UR Regulation:    If discussed at Long Length of Stay Meetings, dates discussed:    Comments:  07/31/13 13:45 Cm spoke with pt in room to offer choice.  Pt chooses [facility] for his HHRN/disease mgmt for CHF. Referral faxed with facesheet, H&P, HH order, DS.  Scale to be picked up by pt in the gift shop on his way out from discharge. Link to Wellness scale request given to pt.   Pt states he will speak with his MD concerning his CPAP at followup.  No other CM needs were communicated.  Freddy Jaksch, BSN, [place]aryl Ada [phone].  07/30/13 16:00 CM met with pt in room and gave pt a 30-day free trial Xarelto card and a $5. co-pay card.  Pt has internet access and feels comfortable activating his cards via internet.  Pt's [name], [phone], was called to check on availability.  HT pharmacy states 20mg  tabs are available.  Information relayed to pt.  No other CM needs were communicated.  Freddy Jaksch, BSN, CM

## 2013-07-31 NOTE — Progress Notes (Signed)
Patient placed himself on cpap.  

## 2013-07-31 NOTE — Discharge Summary (Signed)
Discharge Summary   Patient ID: Jeremiah Perkins,  MRN: [id number], DOB/AGE: 1961-08-22 52 y.o.  Admit date: 07/26/2013 Discharge date: 07/31/2013  Primary Physician: Christen Bame, MD Primary Cardiologist: Wylene Simmer, MD  Discharge Diagnoses Principal Problem:   Atrial flutter Active Problems:   Acute on chronic systolic CHF (congestive heart failure)   Hypertension   Gout   Diabetes mellitus type 2, controlled, with complications   Diabetic peripheral neuropathy   Morbid obesity   OSA (obstructive sleep apnea)   Long term (current) use of anticoagulants   Cough   Groin mass   Allergies Allergies  Allergen Reactions  . Other     Eggs - nausea/vomiting (did ok with the flu shot, seems to be an issue with the type of preparation of the egg product)  . Sulfa Antibiotics Nausea And Vomiting    Diagnostic Studies/Procedures  PA/LATERAL CHEST X-RAY - 07/26/13  IMPRESSION:  Cardiomegaly with vascular congestion. Stable exam.  CARDIAC CATHETERIZATION - 07/27/13  Hemodynamics:  AO 106/69 mean of 84 mm Hg  LV 102/25 mm Hg  Coronary angiography:  Coronary dominance: right  Left mainstem: Normal  Left anterior descending (LAD): Normal  Left circumflex (LCx): Normal  Right coronary artery (RCA): Normal  Left ventriculography: Left ventricular systolic function is markedly abnormal. It is moderately enlarged with severe global hypokinesis and overall EF of 20-25%. There is mild mitral insufficiency.  Final Conclusions:  1. Normal coronary anatomy.  2. Severe LV dysfunction EF 20-25%  LEFT GROIN ULTRASOUND - 07/28/13  IMPRESSION:  Left groin subcutaneous edema without drainable collection.  TEE/DCCV - 07/28/13  Impressions:  - 1. Moderately decreased left ventricular function (EF 25-30%) with diffuse hypokinesis. 2. Moderately decreased right ventricular function. 3. Moderately dilated left atrium. 4. Evidence of smoke and decreased left atrial appendage filling  velocities but no definite thrombus identified in the left atrium or left atrial appendage. No cardiac source of emboli was indentified.  Procedure: Electrical Cardioversion  Indications: Atrial Flutter, the patient is on Heparin drip and TEE ruled out left atrial thrombus   Procedure Details  Performed  Patient placed on cardiac monitor, pulse oximetry, supplemental oxygen as necessary.  Sedation given: Propofol per ansthesia  Pacer pads placed anterior and posterior chest.  Cardioverted 1 time(s).  Cardioverted at 200J.   Evaluation  Findings: Post procedure EKG shows: NSR  Complications: None  Patient did tolerate procedure well.  History of Present Illness  [name] is a 52 y.o. male who was admitted to [facility] hospital 9/2 to 9/7 with the above problem list.  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 He has a history of prior cardiac catheterization in the 1990s which was normal. Cardiac stress test in 2005 was suboptimal, however did not reveal definite perfusion defects. EF 61% at that time.  He then admitted on 07/08/13 with acute on chronic combined CHF responsive to Lasix. A 2-D echocardiogram performed 8/18 revealed an EF 30-35% and diffuse hypokinesis which was new from her prior echocardiogram performed in 2012. During his admission, he developed atrial flutter and the patient's CHF was suspected to be secondary to tachycardia mediated cardiomyopathy. There is no evidence of ischemia at that time. Atrial flutter was rate controlled and the patient was discharged.  Since then, he continued to feel poorly and developed progressive dyspnea on exertion. He  noted paroxysmal nocturnal dyspnea and exertional, substernal chest tightness. He does have a history of sleep  apnea, however has been unable to afford CPAP. He also noted a left groin palpable mass which had previously drained clear fluid. He attributed this to his prior heart catheterization in the 1990s claiming it to be a complication from groin site access.  In the ED, EKG revealed low-voltage QRS morphology and 21 atrial flutter. Initial troponin returned within normal limits. Pro BNP was elevated at 1050. Chest x-ray as above indicated cardiomegaly with pulmonary vascular congestion. INR was subtherapeutic at 1.40. The patient reported Coumadin compliance. Given the patient's chest pain description, newly reduced EF, cardiac risk factors and lack of her recent ischemic evaluation, the decision was made to admit the patient and proceed with cardiac catheterization the following morning.  Hospital Course   He was heparinized. Coumadin was held in anticipation for cardiac cath. Two subsequent troponins returned within normal limits. Atrial flutter remained rate controlled.  Dr. Patty Sermons evaluated the patient the following morning and agreed to proceed with cardiac catheterization. Radial access was performed given the patient's history of prior groin complication after cath in the past. The patient did endorse a new nonproductive cough. ACE inhibitor was held with improvement.  He was informed, consented and prepped for cardiac catheterization which was accessed via the right radial artery. As above as indicated normal coronary arteries, moderately enlarged left ventricle with severe global hypokinesis and overall EF 20-25%, mild mitral insufficiency. Medical management was recommended. The patient tolerated the procedure well without complications. Coumadin was resumed. On 07/28/13 he underwent TEE revealing no evidence of LAA thrombus. DC cardioversion was performed successfully as above with restoration to normal sinus rhythm. He tolerated the procedure well without complications.  The center of the patient's  admission thereafter consisted of medication titration and INR monitoring. The patient's INR continued to be subtherapeutic. Dr. Patty Sermons attributed this to Coumadin unresponsiveness. Xarelto was subsequently started in its place. He did have an approximate 9 pound weight gain. Outpatient Lasix dosing was increased.  Of note, the patient did undergo a left groin ultrasound which revealed no drainable fluid collection within the left groin lesion.  He was evaluated by Dr. Patty Sermons this morning and deemed the patient stable for discharge. He will be discharged on the medication regimen outlined below. Additionally, case management has been consulted to arrange Xarelto assistance, a home blood pressure cuff, scale and CPAP machine. Low-dose ACE inhibitor was initially restarted, however given the patient's endorsement of cough, this will be replaced with low-dose ARB. He will followup in 7 days as part of transitional care management. He has an appointment with Dr. Patty Sermons on 08/03/13. Arrangements for medical disability will be completed at that time. The patient will need a repeat BMET in 1 week given the addition of ARB. He will need an echocardiogram in approximately 3 months to evaluate his EF with optimize medical therapy and maintenance of normal sinus rhythm. This information, including supplemental atrial flutter material and Xarelto information, has been clearly outlined in the discharge AVS.  Discharge Vitals:  Blood pressure 101/60, pulse 63, temperature 98 F (36.7 C), temperature source Oral, resp. rate 20, height 5\' 7"  (1.702 m), weight 127.3 kg (280 lb 10.3 oz), SpO2 98.00%.   Labs: Recent Labs     07/30/13  0545  07/31/13  0512  WBC  8.9  6.1  HGB  14.3  12.3*  HCT  41.7  36.5*  MCV  90.3  90.8  PLT  206  199   Recent Labs Lab 07/26/13 2015 07/27/13 0415 07/28/13 0855 07/31/13 0945  NA  --  138 136 135  K  --  3.8 4.1 4.3  CL  --  100 100 100  CO2  --  27 24 25   BUN   --  25* 20 15  CREATININE  --  1.11 1.09 1.00  CALCIUM  --  8.9 8.9 8.4  PROT 7.1  --   --   --   BILITOT 0.7  --   --   --   ALKPHOS 55  --   --   --   ALT 19  --   --   --   AST 21  --   --   --   LIPASE 62*  --   --   --   GLUCOSE  --  115* 143* 165*   Disposition:  Discharge Orders   Future Appointments Provider Department Dept Phone   08/01/2013 10:30 AM [facility] [phone]   08/03/2013 3:45 PM Cassell Clement, MD [facility] Main Office [place]) [phone]   Future Orders Complete By Expires   Diet - low sodium heart healthy  As directed    Increase activity slowly  As directed          Follow-up Information   Follow up with Cassell Clement, MD On 08/03/2013. (At 3:45 PM for follow-up. )    Specialty:  Cardiology   Contact information:   [address] [place] [address] [phone]       Follow up with Christen Bame, MD. Schedule an appointment as soon as possible for a visit in 1 week. (For general post-hospital follow-up. )    Specialty:  Internal Medicine   Contact information:   [address] [place] [address] [phone]       Follow up with [facility]. (Home health nurse for disease mgmt of congestive heart failure)    Contact information:   [name] [address] [place], [place] [address] [phone]      Discharge Medications:    Medication List    STOP taking these medications       albuterol 108 (90 BASE) MCG/ACT inhaler  Commonly known as:  PROVENTIL HFA;VENTOLIN HFA     lisinopril 5 MG tablet  Commonly known as:  PRINIVIL,ZESTRIL     warfarin 5 MG tablet  Commonly known as:  COUMADIN      TAKE these medications       AGAMATRIX PRESTO PRO METER Devi  1 Device by Does not apply route once.     allopurinol 300 MG tablet  Commonly known as:  ZYLOPRIM  Take 300 mg by mouth daily.     aspirin 81 MG tablet  Take 81 mg by mouth daily.      atorvastatin 40 MG tablet  Commonly known as:  LIPITOR  Take 1 tablet (40 mg total) by mouth daily.     carvedilol 6.25 MG tablet  Commonly known as:  COREG  Take 1 tablet (6.25 mg total) by mouth 2 (two) times daily with a meal.     colchicine 0.6 MG tablet  Take 0.6 mg by mouth daily.     docusate sodium 100 MG capsule  Commonly known as:  COLACE  Take 100 mg by mouth daily as needed for constipation. For constipation     furosemide 80 MG tablet  Commonly known as:  LASIX  Take 1 tablet (80 mg total) by mouth daily.     gabapentin 600 MG tablet  Commonly known as:  NEURONTIN  Take 600-1,200 mg by mouth 2 (two) times daily. Takes 600mg  in the am & 1200mg  in the pm     glucose blood test strip  Commonly known as:  AGAMATRIX PRESTO TEST  Use as instructed     HYDROcodone-acetaminophen 5-325 MG per tablet  Commonly known as:  NORCO/VICODIN  Take 1 tablet by mouth every 6 (six) hours as needed for pain.     levalbuterol 45 MCG/ACT inhaler  Commonly known as:  XOPENEX HFA  Inhale 1-2 puffs into the lungs every 4 (four) hours as needed for wheezing.     LORazepam 0.5 MG tablet  Commonly known as:  ATIVAN  Take 0.5 mg by mouth at bedtime as needed for anxiety. For anxiety     losartan 25 MG tablet  Commonly known as:  COZAAR  Take 1 tablet (25 mg total) by mouth daily.     MEGA MULTIVITAMIN FOR MEN Tabs  Take 1 capsule by mouth every morning.     metFORMIN 500 MG tablet  Commonly known as:  GLUCOPHAGE  Take 1,000 mg by mouth 2 (two) times daily with a meal.     pantoprazole 40 MG tablet  Commonly known as:  PROTONIX  Take 1 tablet (40 mg total) by mouth daily.     Rivaroxaban 20 MG Tabs tablet  Commonly known as:  XARELTO  Take 1 tablet (20 mg total) by mouth daily with supper.     sildenafil 100 MG tablet  Commonly known as:  VIAGRA  Take 100 mg by mouth daily as needed. For ED     VICTOZA 18 MG/3ML Soln injection  Generic drug:  Liraglutide  Inject 1.8  mg into the skin daily.       Outstanding Labs/Studies: BMET in 1 week, repeat echo in 3 months  Duration of Discharge Encounter: Greater than 30 minutes including physician time.  Signed, R. Hurman Horn, PA-C 07/31/2013, 2:04 PM

## 2013-08-01 ENCOUNTER — Ambulatory Visit: Payer: Self-pay

## 2013-08-01 ENCOUNTER — Encounter (HOSPITAL_COMMUNITY): Payer: Self-pay | Admitting: Anesthesiology

## 2013-08-02 ENCOUNTER — Encounter (HOSPITAL_COMMUNITY): Payer: Self-pay | Admitting: Cardiology

## 2013-08-02 NOTE — Anesthesia Postprocedure Evaluation (Signed)
Anesthesia Post Note  Patient: Jeremiah Perkins  Procedure(s) Performed: Procedure(s) (LRB): TRANSESOPHAGEAL ECHOCARDIOGRAM (TEE) (N/A) CARDIOVERSION (N/A)  Anesthesia type: General  Patient location: PACU  Post pain: Pain level controlled  Post assessment: Patient's Cardiovascular Status Stable  Last Vitals:  Filed Vitals:   07/31/13 0948  BP: 101/60  Pulse:   Temp:   Resp:     Post vital signs: Reviewed and stable  Level of consciousness: alert  Complications: No apparent anesthesia complications

## 2013-08-02 NOTE — Anesthesia Preprocedure Evaluation (Signed)
Anesthesia Evaluation  Patient identified by MRN, date of birth, ID band Patient awake    Reviewed: Allergy & Precautions, H&P , NPO status , Patient's Chart, lab work & pertinent test results, reviewed documented beta blocker date and time   Airway Mallampati: II TM Distance: >3 FB Neck ROM: full    Dental   Pulmonary shortness of breath, sleep apnea ,  breath sounds clear to auscultation        Cardiovascular hypertension, +CHF + dysrhythmias Rhythm:regular     Neuro/Psych  Headaches, PSYCHIATRIC DISORDERS Anxiety Depression  Neuromuscular disease negative psych ROS   GI/Hepatic negative GI ROS, Neg liver ROS,   Endo/Other  diabetes  Renal/GU negative Renal ROS  negative genitourinary   Musculoskeletal   Abdominal   Peds  Hematology negative hematology ROS (+)   Anesthesia Other Findings See surgeon's H&P   Reproductive/Obstetrics negative OB ROS                           Anesthesia Physical Anesthesia Plan  ASA: III  Anesthesia Plan: General   Post-op Pain Management:    Induction: Intravenous  Airway Management Planned: Mask  Additional Equipment:   Intra-op Plan:   Post-operative Plan:   Informed Consent: I have reviewed the patients History and Physical, chart, labs and discussed the procedure including the risks, benefits and alternatives for the proposed anesthesia with the patient or authorized representative who has indicated his/her understanding and acceptance.   Dental Advisory Given  Plan Discussed with: CRNA and Surgeon  Anesthesia Plan Comments:         Anesthesia Quick Evaluation

## 2013-08-03 ENCOUNTER — Encounter: Payer: Self-pay | Admitting: Cardiology

## 2013-08-03 ENCOUNTER — Telehealth: Payer: Self-pay | Admitting: Cardiology

## 2013-08-03 ENCOUNTER — Ambulatory Visit (INDEPENDENT_AMBULATORY_CARE_PROVIDER_SITE_OTHER): Payer: BC Managed Care – PPO | Admitting: Cardiology

## 2013-08-03 VITALS — BP 104/58 | HR 67 | Ht 67.0 in | Wt 268.0 lb

## 2013-08-03 DIAGNOSIS — I5022 Chronic systolic (congestive) heart failure: Secondary | ICD-10-CM

## 2013-08-03 DIAGNOSIS — I4892 Unspecified atrial flutter: Secondary | ICD-10-CM

## 2013-08-03 DIAGNOSIS — I428 Other cardiomyopathies: Secondary | ICD-10-CM

## 2013-08-03 DIAGNOSIS — Z79899 Other long term (current) drug therapy: Secondary | ICD-10-CM

## 2013-08-03 DIAGNOSIS — I10 Essential (primary) hypertension: Secondary | ICD-10-CM

## 2013-08-03 NOTE — Progress Notes (Signed)
Jeremiah Perkins Date of Birth:  September 20, 1961 [facility] [address] [place], [place]  [address] [phone]         Fax   [phone]  History of Present Illness: This pleasant 52 year old African American gentleman is seen in the office for the first time today as a post hospital transition of care visit.  He was recently hospitalized at [facility] for exacerbation of acute on chronic systolic congestive heart failure complicated by atrial flutter.  He has a history of a nonischemic cardiomyopathy with previous catheterization showing no significant obstructive disease.  His ejection fraction is estimated at 20-25%.  During his recent hospital stay he underwent TEE cardioversion from atrial flutter back to normal sinus rhythm.  He is maintaining normal sinus rhythm.  He is diuresing on his current outpatient regimen.  He still notes significant dyspnea with minimal exertion.  He has not been aware of any recurrent atrial flutter or palpitations.  He does have occasional chest discomfort.  The patient also has a history of diabetes mellitus type 2 which is followed at the internal medicine clinic.  He has a history of obstructive sleep apnea and morbid obesity and diabetic peripheral neuropathy.  He has a past history of hypertension and gout.  Current Outpatient Prescriptions  Medication Sig Dispense Refill  . allopurinol (ZYLOPRIM) 300 MG tablet Take 300 mg by mouth daily.      Marland Kitchen aspirin 81 MG tablet Take 81 mg by mouth daily.      Marland Kitchen atorvastatin (LIPITOR) 40 MG tablet Take 1 tablet (40 mg total) by mouth daily.  30 tablet  0  . Blood Glucose Monitoring Suppl (AGAMATRIX PRESTO PRO METER) DEVI 1 Device by Does not apply route once.  1 Device  0  . carvedilol (COREG) 6.25 MG tablet Take 1 tablet (6.25 mg total) by mouth 2 (two) times daily with a meal.  60 tablet  0  . colchicine 0.6 MG tablet Take 0.6 mg by mouth daily.      Marland Kitchen docusate sodium (COLACE) 100 MG capsule  Take 100 mg by mouth daily as needed for constipation. For constipation      . furosemide (LASIX) 80 MG tablet Take 1 tablet (80 mg total) by mouth daily.  30 tablet  3  . gabapentin (NEURONTIN) 600 MG tablet Take 600-1,200 mg by mouth 2 (two) times daily. Takes 600mg  in the am & 1200mg  in the pm      . glucose blood (AGAMATRIX PRESTO TEST) test strip Use as instructed  100 each  12  . HYDROcodone-acetaminophen (NORCO/VICODIN) 5-325 MG per tablet Take 1 tablet by mouth every 6 (six) hours as needed for pain.  60 tablet  1  . levalbuterol (XOPENEX HFA) 45 MCG/ACT inhaler Inhale 1-2 puffs into the lungs every 4 (four) hours as needed for wheezing.  1 Inhaler  12  . Liraglutide (VICTOZA) 18 MG/3ML SOLN Inject 1.8 mg into the skin daily.      Marland Kitchen losartan (COZAAR) 25 MG tablet Take 1 tablet (25 mg total) by mouth daily.  30 tablet  3  . metFORMIN (GLUCOPHAGE) 500 MG tablet Take 1,000 mg by mouth 2 (two) times daily with a meal.      . Multiple Vitamins-Minerals (MEGA MULTIVITAMIN FOR MEN) TABS Take 1 capsule by mouth every morning.  30 tablet  3  . pantoprazole (PROTONIX) 40 MG tablet Take 1 tablet (40 mg total) by mouth daily.  30 tablet  3  .  Rivaroxaban (XARELTO) 20 MG TABS tablet Take 1 tablet (20 mg total) by mouth daily with supper.  30 tablet  3  . sildenafil (VIAGRA) 100 MG tablet Take 100 mg by mouth daily as needed. For ED      . LORazepam (ATIVAN) 0.5 MG tablet Take 0.5 mg by mouth at bedtime as needed for anxiety. For anxiety       No current facility-administered medications for this visit.    Allergies  Allergen Reactions  . Other     Eggs - nausea/vomiting (did ok with the flu shot, seems to be an issue with the type of preparation of the egg product)  . Sulfa Antibiotics Nausea And Vomiting    Patient Active Problem List   Diagnosis Date Noted  . Cough 07/31/2013  . Acute on chronic systolic CHF (congestive heart failure) 07/31/2013  . Groin mass 07/31/2013  . Long term  (current) use of anticoagulants 07/22/2013  . Atrial flutter 07/15/2013  . Abnormality of gait 02/28/2013  . Worsening headaches 01/11/2013  . URI (upper respiratory infection) 11/26/2012  . Boils 11/26/2012  . Morbid obesity 11/11/2012  . Encounter to establish care with new doctor 11/11/2012  . Diabetes mellitus type 2, controlled, with complications   . Diverticulosis   . Arthritis   . Major depression   . Diabetic peripheral neuropathy   . Anxiety   . Psychosexual dysfunction with inhibited sexual excitement   . OSA (obstructive sleep apnea)   . Dyspnea 10/27/2011  . Hypertension   . Chronic systolic heart failure   . Gout     History  Smoking status  . Former Smoker -- 0.12 packs/day for 10 years  . Types: Cigarettes  Smokeless tobacco  . Never Used    Comment: Smokes 3 cigs per week / previously smoked 1ppd x 1-2 years; "nothing since ~ 2010" ; "chewed tobacco as a kid" (07/26/2013)    History  Alcohol Use  . Yes    Comment: 07/26/2013 "dron't drink q wk; only on big sporting events, birthdays, weddings, etc"    Family History  Problem Relation Age of Onset  . Diabetes Mother   . CAD Mother 89    requiring quadruple bypass  . Hypertension Mother   . Stomach cancer Father   . Lung cancer Father     was a smoker  . Gout Father   . CAD Brother 48    requiring quadruple bypass  . Seizures Brother   . Hypertension Brother   . Diabetes Maternal Grandmother   . Alzheimer's disease Maternal Grandmother   . Alzheimer's disease Mother   . Breast cancer Paternal Aunt   . Diabetes Maternal Aunt     Review of Systems: Constitutional: no fever chills diaphoresis or fatigue or change in weight.  Head and neck: no hearing loss, no epistaxis, no photophobia or visual disturbance. Respiratory: No cough, shortness of breath or wheezing. Cardiovascular: No chest pain peripheral edema, palpitations. Gastrointestinal: No abdominal distention, no abdominal pain, no change in  bowel habits hematochezia or melena. Genitourinary: No dysuria, no frequency, no urgency, no nocturia. Musculoskeletal:No arthralgias, no back pain, no gait disturbance or myalgias. Neurological: No dizziness, no headaches, no numbness, no seizures, no syncope, no weakness, no tremors. Hematologic: No lymphadenopathy, no easy bruising. Psychiatric: No confusion, no hallucinations, no sleep disturbance.    Physical Exam: Filed Vitals:   08/03/13 1617  BP: 104/58  Pulse: 67   the general appearance reveals a large gentleman in no  distress.The head and neck exam reveals pupils equal and reactive.  Extraocular movements are full.  There is no scleral icterus.  The mouth and pharynx are normal.  The neck is supple.  The carotids reveal no bruits.  The jugular venous pressure is normal.  The  thyroid is not enlarged.  There is no lymphadenopathy.  The chest is clear to percussion and auscultation.  There are no rales or rhonchi.  Expansion of the chest is symmetrical.  The precordium is quiet.  The first heart sound is normal.  The second heart sound is physiologically split.  There is no murmur gallop rub or click.  There is no abnormal lift or heave.  The abdomen is soft and nontender.  The bowel sounds are normal.  The liver and spleen are not enlarged.  There are no abdominal masses.  There are no abdominal bruits.  Extremities reveal good pedal pulses.  There is trace peripheral edema..  There is no cyanosis or clubbing.  Strength is normal and symmetrical in all extremities.  There is no lateralizing weakness.  There are no sensory deficits.  The skin is warm and dry.  There is no rash.  EKG confirms normal sinus rhythm with premature atrial complexes and low voltage.   Assessment / Plan: The patient is to continue on current medical therapy.  We're checking a basal metabolic panel today.  We're referring to outpatient cardiac rehabilitation. Patient has history of erectile dysfunction and is  flying to [employer] program in regard to Viagra samples. Recheck here in one month for followup office visit CBC and basal metabolic panel and EKG.  Diabetic management we'll continue with [facility].

## 2013-08-03 NOTE — Patient Instructions (Signed)
Will obtain labs today and call you with the results (bmet)  Will arrange outpatient cardiac rehab, they will call you  Your physician recommends that you schedule a follow-up appointment in: 1 month ov/keg/bmet/cbc

## 2013-08-03 NOTE — Telephone Encounter (Signed)
Advised Myrene Buddy ok

## 2013-08-03 NOTE — Telephone Encounter (Signed)
Nurse with [facility] would like order for telemonitoring for this pt, he has appt today

## 2013-08-03 NOTE — Assessment & Plan Note (Signed)
No problems from his Xarelto.  No recurrent atrial flutter noted.

## 2013-08-03 NOTE — Assessment & Plan Note (Signed)
The patient has orthopnea and has significant exertional dyspnea.  He is anxious to build up his stamina.  We are referring him to the common outpatient cardiac rehabilitation program

## 2013-08-03 NOTE — Assessment & Plan Note (Signed)
Blood pressure is in the low normal range on current therapy.  We are checking a basal metabolic panel today to assess renal response to his current dose of Lasix 80 mg daily

## 2013-08-03 NOTE — Telephone Encounter (Signed)
Left message to call back  

## 2013-08-04 LAB — BASIC METABOLIC PANEL
BUN: 23 mg/dL (ref 6–23)
Chloride: 99 mEq/L (ref 96–112)
Potassium: 4.5 mEq/L (ref 3.5–5.1)

## 2013-08-04 NOTE — Progress Notes (Signed)
Quick Note:  Please report to patient. The recent labs are stable. Continue same medication and careful diet. ______ 

## 2013-08-05 ENCOUNTER — Ambulatory Visit (INDEPENDENT_AMBULATORY_CARE_PROVIDER_SITE_OTHER): Payer: BC Managed Care – PPO | Admitting: Internal Medicine

## 2013-08-05 ENCOUNTER — Encounter: Payer: Self-pay | Admitting: Internal Medicine

## 2013-08-05 ENCOUNTER — Ambulatory Visit (INDEPENDENT_AMBULATORY_CARE_PROVIDER_SITE_OTHER): Payer: BC Managed Care – PPO | Admitting: Psychiatry

## 2013-08-05 ENCOUNTER — Encounter (HOSPITAL_COMMUNITY): Payer: Self-pay | Admitting: Psychiatry

## 2013-08-05 VITALS — BP 114/68 | HR 66 | Wt 268.0 lb

## 2013-08-05 VITALS — BP 91/59 | HR 64 | Temp 97.2°F | Ht 67.0 in | Wt 269.3 lb

## 2013-08-05 DIAGNOSIS — F339 Major depressive disorder, recurrent, unspecified: Secondary | ICD-10-CM

## 2013-08-05 DIAGNOSIS — I5022 Chronic systolic (congestive) heart failure: Secondary | ICD-10-CM

## 2013-08-05 DIAGNOSIS — E118 Type 2 diabetes mellitus with unspecified complications: Secondary | ICD-10-CM

## 2013-08-05 DIAGNOSIS — I509 Heart failure, unspecified: Secondary | ICD-10-CM

## 2013-08-05 DIAGNOSIS — G4733 Obstructive sleep apnea (adult) (pediatric): Secondary | ICD-10-CM

## 2013-08-05 DIAGNOSIS — F329 Major depressive disorder, single episode, unspecified: Secondary | ICD-10-CM

## 2013-08-05 DIAGNOSIS — Z09 Encounter for follow-up examination after completed treatment for conditions other than malignant neoplasm: Secondary | ICD-10-CM

## 2013-08-05 LAB — POCT GLYCOSYLATED HEMOGLOBIN (HGB A1C): Hemoglobin A1C: 6

## 2013-08-05 MED ORDER — METFORMIN HCL 500 MG PO TABS: 1000.0000 mg | ORAL_TABLET | Freq: Two times a day (BID) | ORAL | Status: DC

## 2013-08-05 MED ORDER — ESCITALOPRAM OXALATE 10 MG PO TABS: 10.0000 mg | ORAL_TABLET | Freq: Every day | ORAL | Status: DC

## 2013-08-05 MED ORDER — LORAZEPAM 0.5 MG PO TABS: 0.5000 mg | ORAL_TABLET | Freq: Every evening | ORAL | Status: DC | PRN

## 2013-08-05 NOTE — Progress Notes (Signed)
Subjective:   Patient ID: Jeremiah Perkins male   DOB: [birth year] 51 y.o.   MRN: [id number]  HPI: Jeremiah Perkins is a 52 y.o. man pmh CHF with worsening EF fxn of 25-30% as found by last hospitalization and a-flutter with RVR s/p cardioversion on Xarelto and severe OSA coming in for hospital f/u 07/26/13 for acute/chronic CHF exacerbation and new onset a-flutter. Pt has f/u with cardiology and they are pursuing medical management at this time. Pt is tolerating medications well at this time with only some periods of fatigue.   Pt has denied any excess weight gain, palpitations, HA, SOB, DOE, dizziness upon standing, blurry vision, increased pillow orthopnea, n/v/d, or f/chills.   In terms of depression is also being seen and establishing care with a psychiatrist will be starting lexapro. Pt mood seems to be improving and appetite improving.   Pt did get good sleep in hospital when was able to tolerate mask after ativan and that greatly improved sleep quality for patient.     Past Medical History  Diagnosis Date  . Hypertension   . Congestive heart failure     No echo data available. Myocardial perfusion 2005 with EF 61%, presumed diastolic HF   . Diverticulosis     with history of diverticulitis in 10/2011  . Gout   . Major depression   . Anxiety   . Psychosexual dysfunction with inhibited sexual excitement   . Hyperlipidemia   . Atrial flutter   . Morbid obesity   . Diabetic peripheral neuropathy   . Type II diabetes mellitus dx'd 1991    previously on insulin in 2000 for 3-4 years (but was stopped because adamantly did not want to be on insulin)  . Chronic bronchitis     "get it q yr" (07/26/2013)  . Shortness of breath     "all the time" (07/26/2013)  . OSA (obstructive sleep apnea) 1998    previously on CPAP - unable to tolerate; "going to purchase one next week" (07/26/2013)  . Arthritis     involving knees, ankles, back, elbows  . Chronic back pain    Current Outpatient  Prescriptions  Medication Sig Dispense Refill  . allopurinol (ZYLOPRIM) 300 MG tablet Take 300 mg by mouth daily.      Marland Kitchen aspirin 81 MG tablet Take 81 mg by mouth daily.      Marland Kitchen atorvastatin (LIPITOR) 40 MG tablet Take 1 tablet (40 mg total) by mouth daily.  30 tablet  0  . Blood Glucose Monitoring Suppl (AGAMATRIX PRESTO PRO METER) DEVI 1 Device by Does not apply route once.  1 Device  0  . carvedilol (COREG) 6.25 MG tablet Take 1 tablet (6.25 mg total) by mouth 2 (two) times daily with a meal.  60 tablet  0  . colchicine 0.6 MG tablet Take 0.6 mg by mouth daily.      Marland Kitchen docusate sodium (COLACE) 100 MG capsule Take 100 mg by mouth daily as needed for constipation. For constipation      . escitalopram (LEXAPRO) 10 MG tablet Take 1 tablet (10 mg total) by mouth daily.  30 tablet  0  . furosemide (LASIX) 80 MG tablet Take 1 tablet (80 mg total) by mouth daily.  30 tablet  3  . gabapentin (NEURONTIN) 600 MG tablet Take 600-1,200 mg by mouth 2 (two) times daily. Takes 600mg  in the am & 1200mg  in the pm      . glucose blood (AGAMATRIX PRESTO TEST) test  strip Use as instructed  100 each  12  . HYDROcodone-acetaminophen (NORCO/VICODIN) 5-325 MG per tablet Take 1 tablet by mouth every 6 (six) hours as needed for pain.  60 tablet  1  . levalbuterol (XOPENEX HFA) 45 MCG/ACT inhaler Inhale 1-2 puffs into the lungs every 4 (four) hours as needed for wheezing.  1 Inhaler  12  . Liraglutide (VICTOZA) 18 MG/3ML SOLN Inject 1.8 mg into the skin daily.      Marland Kitchen LORazepam (ATIVAN) 0.5 MG tablet Take 1 tablet (0.5 mg total) by mouth at bedtime as needed for anxiety. For anxiety  30 tablet  2  . losartan (COZAAR) 25 MG tablet Take 1 tablet (25 mg total) by mouth daily.  30 tablet  3  . metFORMIN (GLUCOPHAGE) 500 MG tablet Take 2 tablets (1,000 mg total) by mouth 2 (two) times daily with a meal.  240 tablet  12  . Multiple Vitamins-Minerals (MEGA MULTIVITAMIN FOR MEN) TABS Take 1 capsule by mouth every morning.  30 tablet   3  . pantoprazole (PROTONIX) 40 MG tablet Take 1 tablet (40 mg total) by mouth daily.  30 tablet  3  . Rivaroxaban (XARELTO) 20 MG TABS tablet Take 1 tablet (20 mg total) by mouth daily with supper.  30 tablet  3  . sildenafil (VIAGRA) 100 MG tablet Take 100 mg by mouth daily as needed. For ED       No current facility-administered medications for this visit.   Family History  Problem Relation Age of Onset  . Diabetes Mother   . CAD Mother 81    requiring quadruple bypass  . Hypertension Mother   . Alzheimer's disease Mother   . Stomach cancer Father   . Lung cancer Father     was a smoker  . Gout Father   . CAD Brother 48    requiring quadruple bypass  . Seizures Brother   . Hypertension Brother   . Diabetes Maternal Grandmother   . Alzheimer's disease Maternal Grandmother   . Breast cancer Paternal Aunt   . Diabetes Maternal Aunt   . Seizures Paternal Uncle    History   Social History  . Marital Status: Married    Spouse Name: N/A    Number of Children: 3  . Years of Education: AA   Occupational History  . Now unemployed     Barrister's clerk at [employer]   Social History Main Topics  . Smoking status: Former Smoker -- 0.12 packs/day for 10 years    Types: Cigarettes  . Smokeless tobacco: Never Used     Comment: Smokes 3 cigs per week / previously smoked 1ppd x 1-2 years; "nothing since ~ 2010" ; "chewed tobacco as a kid" (07/26/2013)  . Alcohol Use: Yes     Comment: 07/26/2013 "dron't drink q wk; only on big sporting events, birthdays, weddings, etc"  . Drug Use: Yes    Special: Marijuana     Comment: 07/26/2013 "smoked weed in high school and college; nothing for years"  . Sexual Activity: Yes   Other Topics Concern  . 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 They each have children but none between the two of them.   Review of Systems: otherwise negative unless HPI  Objective:  Physical  Exam: Filed Vitals:   08/05/13 1516 08/05/13 1526  BP: 91/57 91/59  Pulse: 63 64  Temp: 97.2 F (36.2 C)  TempSrc: Oral   Height: 5\' 7"  (1.702 m)   Weight: 269 lb 4.8 oz (122.154 kg)   SpO2: 97%    General: sitting in chair, NAD HEENT: PERRL, EOMI, no scleral icterus Cardiac: RRR, no rubs, murmurs or gallops Pulm: clear to auscultation bilaterally, moving normal volumes of air Abd: soft, nontender, nondistended, BS present Ext: warm and well perfused, no pedal edema Neuro: alert and oriented X3, cranial nerves II-XII grossly intact   Assessment & Plan:  1. Hospital followup: Pt feels a little orthostatic today (and has some lower BP) but attributes that to overexertion and decreased po intake. Pt would not like to make changes in diuretics at this time.  -cont current medical management  -lasix, coreg, cozaar  2. OSA: pt continues to work on getting CPAP and has a fitting on 08/08/13  Pt discussed with Dr. Dalphine Handing

## 2013-08-05 NOTE — Progress Notes (Addendum)
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   Borderline      865-784  mg/dL   High      >696     mg/dL   Very High  GLUCOSE, CAPILLARY     Status: Abnormal   Collection Time    07/27/13  7:35 AM      Result Value Range   Glucose-Capillary 116 (*) 70 - 99 mg/dL  GLUCOSE, CAPILLARY     Status: Abnormal   Collection Time    07/27/13 12:11 PM      Result Value Range   Glucose-Capillary 112 (*) 70 - 99 mg/dL  GLUCOSE, CAPILLARY     Status: Abnormal   Collection Time    07/27/13  4:23 PM      Result Value Range   Glucose-Capillary 137 (*) 70 - 99 mg/dL   Comment 1 Notify RN    GLUCOSE, CAPILLARY     Status: Abnormal   Collection Time    07/27/13  8:51 PM      Result Value Range   Glucose-Capillary 166 (*) 70 - 99 mg/dL  CBC     Status: None   Collection Time    07/28/13  4:46  AM      Result Value Range   WBC 6.3  4.0 - 10.5 K/uL   RBC 4.60  4.22 - 5.81 MIL/uL   Hemoglobin 14.1  13.0 - 17.0 g/dL   HCT 29.5  28.4 - 13.2 %   MCV 89.6  78.0 - 100.0 fL   MCH 30.7  26.0 - 34.0 pg   MCHC 34.2  30.0 - 36.0 g/dL   RDW 44.0  10.2 - 72.5 %   Platelets 213  150 - 400 K/uL  PROTIME-INR     Status: Abnormal   Collection Time    07/28/13  4:46 AM      Result Value Range   Prothrombin Time 17.5 (*) 11.6 - 15.2 seconds   INR 1.48  0.00 - 1.49  GLUCOSE, CAPILLARY     Status: Abnormal   Collection Time    07/28/13  7:57 AM      Result Value Range   Glucose-Capillary 123 (*) 70 - 99 mg/dL  BASIC METABOLIC PANEL     Status: Abnormal   Collection Time    07/28/13  8:55 AM      Result Value Range   Sodium 136  135 - 145 mEq/L   Potassium 4.1  3.5 - 5.1 mEq/L   Chloride 100  96 - 112 mEq/L   CO2 24  19 - 32 mEq/L   Glucose, Bld 143 (*) 70 - 99 mg/dL   BUN 20  6 - 23 mg/dL   Creatinine, Ser 3.66  0.50 - 1.35 mg/dL   Calcium 8.9  8.4 - 44.0 mg/dL   GFR calc non Af Amer 75 (*) >90 mL/min   GFR calc Af Amer 87 (*) >90 mL/min   Comment: (NOTE)     The eGFR has been calculated using the CKD EPI equation.     This calculation has not been validated in all clinical situations.     eGFR's persistently <90 mL/min signify possible Chronic Kidney     Disease.  MAGNESIUM     Status: None   Collection Time    07/28/13  8:55 AM      Result Value Range   Magnesium 2.2  1.5 - 2.5 mg/dL  GLUCOSE, CAPILLARY     Status: Abnormal   Collection Time  07/28/13  4:42 PM      Result Value Range   Glucose-Capillary 144 (*) 70 - 99 mg/dL   Comment 1 Notify RN    HEPARIN LEVEL (UNFRACTIONATED)     Status: Abnormal   Collection Time    07/28/13  5:55 PM      Result Value Range   Heparin Unfractionated 0.28 (*) 0.30 - 0.70 IU/mL   Comment:            IF HEPARIN RESULTS ARE BELOW     EXPECTED VALUES, AND PATIENT     DOSAGE HAS BEEN CONFIRMED,     SUGGEST FOLLOW UP TESTING     OF  ANTITHROMBIN III LEVELS.  GLUCOSE, CAPILLARY     Status: Abnormal   Collection Time    07/28/13  7:51 PM      Result Value Range   Glucose-Capillary 231 (*) 70 - 99 mg/dL   Comment 1 Notify RN    HEPARIN LEVEL (UNFRACTIONATED)     Status: None   Collection Time    07/29/13  1:25 AM      Result Value Range   Heparin Unfractionated 0.45  0.30 - 0.70 IU/mL   Comment:            IF HEPARIN RESULTS ARE BELOW     EXPECTED VALUES, AND PATIENT     DOSAGE HAS BEEN CONFIRMED,     SUGGEST FOLLOW UP TESTING     OF ANTITHROMBIN III LEVELS.  CBC     Status: None   Collection Time    07/29/13  1:25 AM      Result Value Range   WBC 9.4  4.0 - 10.5 K/uL   RBC 4.54  4.22 - 5.81 MIL/uL   Hemoglobin 14.2  13.0 - 17.0 g/dL   HCT 16.1  09.6 - 04.5 %   MCV 90.5  78.0 - 100.0 fL   MCH 31.3  26.0 - 34.0 pg   MCHC 34.5  30.0 - 36.0 g/dL   RDW 40.9  81.1 - 91.4 %   Platelets 256  150 - 400 K/uL  PROTIME-INR     Status: Abnormal   Collection Time    07/29/13  1:25 AM      Result Value Range   Prothrombin Time 17.9 (*) 11.6 - 15.2 seconds   INR 1.52 (*) 0.00 - 1.49  GLUCOSE, CAPILLARY     Status: Abnormal   Collection Time    07/29/13  7:50 AM      Result Value Range   Glucose-Capillary 179 (*) 70 - 99 mg/dL  GLUCOSE, CAPILLARY     Status: Abnormal   Collection Time    07/29/13 11:51 AM      Result Value Range   Glucose-Capillary 137 (*) 70 - 99 mg/dL  GLUCOSE, CAPILLARY     Status: Abnormal   Collection Time    07/29/13  4:17 PM      Result Value Range   Glucose-Capillary 135 (*) 70 - 99 mg/dL  GLUCOSE, CAPILLARY     Status: Abnormal   Collection Time    07/29/13  9:04 PM      Result Value Range   Glucose-Capillary 180 (*) 70 - 99 mg/dL  CBC     Status: None   Collection Time    07/30/13  5:45 AM      Result Value Range   WBC 8.9  4.0 - 10.5 K/uL   RBC 4.62  4.22 - 5.81 MIL/uL  Hemoglobin 14.3  13.0 - 17.0 g/dL   HCT 16.1  09.6 - 04.5 %   MCV 90.3  78.0 - 100.0 fL   MCH 31.0   26.0 - 34.0 pg   MCHC 34.3  30.0 - 36.0 g/dL   RDW 40.9  81.1 - 91.4 %   Platelets 206  150 - 400 K/uL  PROTIME-INR     Status: Abnormal   Collection Time    07/30/13  5:45 AM      Result Value Range   Prothrombin Time 18.0 (*) 11.6 - 15.2 seconds   INR 1.53 (*) 0.00 - 1.49  HEPARIN LEVEL (UNFRACTIONATED)     Status: None   Collection Time    07/30/13  5:45 AM      Result Value Range   Heparin Unfractionated 0.65  0.30 - 0.70 IU/mL   Comment:            IF HEPARIN RESULTS ARE BELOW     EXPECTED VALUES, AND PATIENT     DOSAGE HAS BEEN CONFIRMED,     SUGGEST FOLLOW UP TESTING     OF ANTITHROMBIN III LEVELS.  GLUCOSE, CAPILLARY     Status: Abnormal   Collection Time    07/30/13  7:33 AM      Result Value Range   Glucose-Capillary 133 (*) 70 - 99 mg/dL  GLUCOSE, CAPILLARY     Status: Abnormal   Collection Time    07/30/13 11:59 AM      Result Value Range   Glucose-Capillary 145 (*) 70 - 99 mg/dL  GLUCOSE, CAPILLARY     Status: Abnormal   Collection Time    07/30/13  5:30 PM      Result Value Range   Glucose-Capillary 164 (*) 70 - 99 mg/dL  GLUCOSE, CAPILLARY     Status: Abnormal   Collection Time    07/30/13  7:35 PM      Result Value Range   Glucose-Capillary 178 (*) 70 - 99 mg/dL  CBC     Status: Abnormal   Collection Time    07/31/13  5:12 AM      Result Value Range   WBC 6.1  4.0 - 10.5 K/uL   RBC 4.02 (*) 4.22 - 5.81 MIL/uL   Hemoglobin 12.3 (*) 13.0 - 17.0 g/dL   HCT 78.2 (*) 95.6 - 21.3 %   MCV 90.8  78.0 - 100.0 fL   MCH 30.6  26.0 - 34.0 pg   MCHC 33.7  30.0 - 36.0 g/dL   RDW 08.6  57.8 - 46.9 %   Platelets 199  150 - 400 K/uL  GLUCOSE, CAPILLARY     Status: Abnormal   Collection Time    07/31/13  8:10 AM      Result Value Range   Glucose-Capillary 162 (*) 70 - 99 mg/dL  BASIC METABOLIC PANEL     Status: Abnormal   Collection Time    07/31/13  9:45 AM      Result Value Range   Sodium 135  135 - 145 mEq/L   Potassium 4.3  3.5 - 5.1 mEq/L   Chloride  100  96 - 112 mEq/L   CO2 25  19 - 32 mEq/L   Glucose, Bld 165 (*) 70 - 99 mg/dL   BUN 15  6 - 23 mg/dL   Creatinine, Ser 6.29  0.50 - 1.35 mg/dL   Calcium 8.4  8.4 - 52.8 mg/dL   GFR calc non Af Denyse Dago  83 (*) >90 mL/min   GFR calc Af Amer >90  >90 mL/min   Comment: (NOTE)     The eGFR has been calculated using the CKD EPI equation.     This calculation has not been validated in all clinical situations.     eGFR's persistently <90 mL/min signify possible Chronic Kidney     Disease.  GLUCOSE, CAPILLARY     Status: Abnormal   Collection Time    07/31/13 12:02 PM      Result Value Range   Glucose-Capillary 128 (*) 70 - 99 mg/dL  BASIC METABOLIC PANEL     Status: Abnormal   Collection Time    08/03/13  4:53 PM      Result Value Range   Sodium 139  135 - 145 mEq/L   Potassium 4.5  3.5 - 5.1 mEq/L   Chloride 99  96 - 112 mEq/L   CO2 32  19 - 32 mEq/L   Glucose, Bld 105 (*) 70 - 99 mg/dL   BUN 23  6 - 23 mg/dL   Creatinine, Ser 1.2  0.4 - 1.5 mg/dL   Calcium 9.7  8.4 - 16.1 mg/dL   GFR 09.60  >45.40 mL/min    Past Psychiatric History/Hospitalization(s) Patient admitted history of depression and anxiety symptoms and he was going through a divorce in 2007.  At that time he was living in [place].  He saw psychiatrist and prescribed psychotropic medication but do not remember the details of the medication and doses.  He denies any history of mania, psychosis, panic attack, OCD symptoms, hallucinations or any suicidal attempt.  He admitted history of suicidal thoughts at that time but he was never admitted to the psychiatric hospital.  Patient denies any history of nightmares flashbacks or any trauma Anxiety: Yes Bipolar Disorder: No Depression: Yes Mania: No Psychosis: No Schizophrenia: No Personality Disorder: No Hospitalization for psychiatric illness: No History of Electroconvulsive Shock Therapy: No Prior Suicide Attempts: No  Physical Exam: Constitutional:  Wt 268 lb (121.564  kg)  BMI 41.96 kg/m2  Musculoskeletal: Strength & Muscle Tone: within normal limits Gait & Station: normal Patient leans: N/A  Mental Status Examination;  Patient is a middle-aged man who is morbidly obese, casually dressed and fairly groomed.  He maintains fair eye contact.  His speech is slow but coherent.  He described his mood is anxious, tearful and depressed and his affect is constricted.  He denies any active or passive suicidal thoughts or homicidal thoughts.  He denies any auditory or visual hallucination.  His thought process is slow.  He has significant negative thinking however he denies any paranoia, hallucination or delusions.  His fund of knowledge is adequate.  There were no tremors or shakes present.  There were no flight of ideas or any loose association.  His attention and concentration is fair.  He has difficulty expressing his symptoms and he feels very overwhelmed.  He is alert and oriented x3.  His insight judgment and impulse control is okay.   Medical Decision Making (Choose Three): New problem, with additional work up planned, Review of Psycho-Social Stressors (1), Review or order clinical lab tests (1), Review and summation of old records (2), Established Problem, Worsening (2), Review of Medication Regimen & Side Effects (2) and Review of New Medication or Change in Dosage (2)  Assessment: Axis I: Maj. depressive disorder, recurrent  Axis II: Deferred  Axis III:  Past Medical History  Diagnosis Date  . Hypertension   .  Congestive heart failure     No echo data available. Myocardial perfusion 2005 with EF 61%, presumed diastolic HF   . Diverticulosis     with history of diverticulitis in 10/2011  . Gout   . Major depression   . Anxiety   . Psychosexual dysfunction with inhibited sexual excitement   . Hyperlipidemia   . Atrial flutter   . Morbid obesity   . Diabetic peripheral neuropathy   . Type II diabetes mellitus dx'd 1991    previously on insulin in  2000 for 3-4 years (but was stopped because adamantly did not want to be on insulin)  . Chronic bronchitis     "get it q yr" (07/26/2013)  . Shortness of breath     "all the time" (07/26/2013)  . OSA (obstructive sleep apnea) 1998    previously on CPAP - unable to tolerate; "going to purchase one next week" (07/26/2013)  . Arthritis     involving knees, ankles, back, elbows  . Chronic back pain     Axis IV: Moderate   Plan:  I review his symptoms, history, current medication and psychosocial stressors.  Patient is experiencing worsening of his depression.  He is not taking an antidepressant.  We will start Lexapro 10 mg daily.  We will get collateral information from his previous psychiatrist when he was in [place].  I explained the risk and benefits of medication in detail.  We will also schedule the appointment with a therapist in this office for coping and social skills.  Patient also experiencing a lot of pain despite taking multiple pain medication , recommend to talk to his primary care physician to get pain management referral .  Time spent 55 minutes.  More than 50% of the time spent in psychoeducation, counseling and coordination of care.  Discuss safety plan that anytime having active suicidal thoughts or homicidal thoughts then patient need to call 911 or go to the local emergency room.   Gladyce Mcray T., MD 08/05/2013

## 2013-08-06 NOTE — Patient Instructions (Signed)
Goals (1 Years of Data) as of 08/05/13         08/05/13 08/05/13 08/05/13 08/03/13 07/31/13     Blood Pressure    . Blood Pressure < 140/90  91/59 91/57 114/68 104/58 101/60     Lifestyle    . Prevent Falls           Result Component    . HEMOGLOBIN A1C < 7.0  6.0        . LDL CALC < 100           Weight    . Weight < 265 lb (120.203 kg)  269 lb 4.8 oz (122.154 kg) 268 lb (121.564 kg)  268 lb (121.564 kg)

## 2013-08-10 ENCOUNTER — Telehealth: Payer: Self-pay | Admitting: Cardiology

## 2013-08-10 NOTE — Telephone Encounter (Signed)
New problem   Pt is calling in reference to letter that Dr Patty Sermons is suppose to write for him for court concerning his disability. Please call pt concerning this matter.

## 2013-08-11 ENCOUNTER — Telehealth: Payer: Self-pay | Admitting: Cardiology

## 2013-08-11 ENCOUNTER — Ambulatory Visit (HOSPITAL_COMMUNITY): Payer: Self-pay | Admitting: Psychiatry

## 2013-08-11 ENCOUNTER — Other Ambulatory Visit: Payer: Self-pay | Admitting: *Deleted

## 2013-08-11 MED ORDER — GABAPENTIN 600 MG PO TABS: 600.0000 mg | ORAL_TABLET | Freq: Two times a day (BID) | ORAL | Status: DC

## 2013-08-11 NOTE — Telephone Encounter (Signed)
Spoke with patient yesterday and he has picked up the letter

## 2013-08-11 NOTE — Telephone Encounter (Signed)
Left message to call back  

## 2013-08-11 NOTE — Telephone Encounter (Signed)
Needs to speak with Juliette Alcide regarding a letter she received. Would like Melinda to call her back

## 2013-08-12 ENCOUNTER — Encounter: Payer: Self-pay | Admitting: *Deleted

## 2013-08-12 NOTE — Telephone Encounter (Signed)
Letter done

## 2013-08-12 NOTE — Telephone Encounter (Signed)
Spoke with patient regarding letter that was recently done. Per patient his lawyer says the letter needs to state that he is unable to work "permantly" or "indfinately". Will forward to  Dr. Patty Sermons for approval to redo letter.

## 2013-08-12 NOTE — Telephone Encounter (Signed)
Okay to redo the letter to include the concept that the patient will be unable to work indefinitely

## 2013-08-15 ENCOUNTER — Other Ambulatory Visit: Payer: Self-pay | Admitting: Internal Medicine

## 2013-08-17 ENCOUNTER — Telehealth: Payer: Self-pay | Admitting: Internal Medicine

## 2013-08-17 ENCOUNTER — Other Ambulatory Visit: Payer: Self-pay | Admitting: *Deleted

## 2013-08-17 DIAGNOSIS — E118 Type 2 diabetes mellitus with unspecified complications: Secondary | ICD-10-CM

## 2013-08-17 NOTE — Telephone Encounter (Signed)
Pt was not available wanted to check up on symptoms of orthostatics and discussion of multiple medications that were recently started at hospital d/c that could be contributing. This was extensively discussed at last clinic visit but patient refused to make changes. Will attempt to contact patient again. This is 2nd attempt.

## 2013-08-17 NOTE — Telephone Encounter (Signed)
reviewed

## 2013-08-18 ENCOUNTER — Ambulatory Visit (HOSPITAL_COMMUNITY): Payer: Self-pay | Admitting: Psychiatry

## 2013-08-18 NOTE — Progress Notes (Signed)
Case discussed with Dr. Sadek at the time of the visit.  We reviewed the resident's history and exam and pertinent patient test results.  I agree with the assessment, diagnosis, and plan of care documented in the resident's note. 

## 2013-08-19 ENCOUNTER — Telehealth: Payer: Self-pay | Admitting: Cardiology

## 2013-08-19 NOTE — Telephone Encounter (Signed)
T/O ok to continue

## 2013-08-19 NOTE — Telephone Encounter (Signed)
New problem   Need orders for [facility] to continue to see pt until he start cardio program at [facility] middle of October.

## 2013-08-20 NOTE — Telephone Encounter (Signed)
Agree 

## 2013-08-23 ENCOUNTER — Ambulatory Visit (INDEPENDENT_AMBULATORY_CARE_PROVIDER_SITE_OTHER): Payer: BC Managed Care – PPO | Admitting: Psychiatry

## 2013-08-23 ENCOUNTER — Encounter (HOSPITAL_COMMUNITY): Payer: Self-pay | Admitting: Psychiatry

## 2013-08-23 VITALS — BP 102/65 | HR 76 | Ht 67.0 in | Wt 263.0 lb

## 2013-08-23 DIAGNOSIS — F329 Major depressive disorder, single episode, unspecified: Secondary | ICD-10-CM

## 2013-08-23 DIAGNOSIS — F339 Major depressive disorder, recurrent, unspecified: Secondary | ICD-10-CM

## 2013-08-23 MED ORDER — ESCITALOPRAM OXALATE 20 MG PO TABS: 20.0000 mg | ORAL_TABLET | Freq: Every day | ORAL | Status: DC

## 2013-08-23 NOTE — Progress Notes (Signed)
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 Table                         Men   Women      1/2 Average Risk   3.4   3.3      Average Risk       5.0   4.4      2 X Average Risk   9.6   7.1      3 X Average Risk  23.4   11.0                Use the calculated Patient Ratio  above and the CHD Risk Table     to determine the patient's CHD Risk.                ATP III CLASSIFICATION (LDL):      <100     mg/dL   Optimal      161-096  mg/dL   Near or Above                        Optimal      130-159  mg/dL   Borderline      045-409  mg/dL   High      >811     mg/dL   Very High  GLUCOSE, CAPILLARY     Status: Abnormal   Collection Time    07/27/13  7:35 AM      Result Value Range   Glucose-Capillary 116 (*) 70 - 99 mg/dL  GLUCOSE, CAPILLARY     Status: Abnormal    Collection Time    07/27/13 12:11 PM      Result Value Range   Glucose-Capillary 112 (*) 70 - 99 mg/dL  GLUCOSE, CAPILLARY     Status: Abnormal   Collection Time    07/27/13  4:23 PM      Result Value Range   Glucose-Capillary 137 (*) 70 - 99 mg/dL   Comment 1 Notify RN    GLUCOSE, CAPILLARY     Status: Abnormal   Collection Time    07/27/13  8:51 PM      Result Value Range   Glucose-Capillary 166 (*) 70 - 99 mg/dL  CBC     Status: None   Collection Time    07/28/13  4:46 AM      Result Value Range   WBC 6.3  4.0 - 10.5 K/uL   RBC 4.60  4.22 - 5.81 MIL/uL   Hemoglobin 14.1  13.0 - 17.0 g/dL   HCT 91.4  78.2 - 95.6 %   MCV 89.6  78.0 - 100.0 fL   MCH 30.7  26.0 - 34.0 pg   MCHC 34.2  30.0 - 36.0 g/dL   RDW 21.3  08.6 - 57.8 %   Platelets 213  150 - 400 K/uL  PROTIME-INR     Status: Abnormal   Collection Time    07/28/13  4:46 AM      Result Value Range   Prothrombin Time 17.5 (*) 11.6 - 15.2 seconds   INR 1.48  0.00 - 1.49  GLUCOSE, CAPILLARY     Status: Abnormal   Collection Time    07/28/13  7:57 AM      Result Value Range   Glucose-Capillary 123 (*) 70 - 99 mg/dL  BASIC METABOLIC PANEL     Status: Abnormal   Collection Time    07/28/13  8:55 AM      Result Value Range   Sodium 136  135 - 145 mEq/L   Potassium 4.1  3.5 - 5.1 mEq/L   Chloride 100  96 - 112 mEq/L   CO2 24  19 - 32 mEq/L   Glucose, Bld 143 (*) 70 - 99 mg/dL   BUN 20  6 - 23 mg/dL   Creatinine, Ser 4.69  0.50 - 1.35 mg/dL   Calcium 8.9  8.4 - 62.9 mg/dL   GFR calc non Af Amer 75 (*) >90 mL/min   GFR calc Af Amer 87 (*) >90 mL/min   Comment: (NOTE)  The eGFR has been calculated using the CKD EPI equation.     This calculation has not been validated in all clinical situations.     eGFR's persistently <90 mL/min signify possible Chronic Kidney     Disease.  MAGNESIUM     Status: None   Collection Time    07/28/13  8:55 AM      Result Value Range   Magnesium 2.2  1.5 - 2.5 mg/dL  GLUCOSE,  CAPILLARY     Status: Abnormal   Collection Time    07/28/13  4:42 PM      Result Value Range   Glucose-Capillary 144 (*) 70 - 99 mg/dL   Comment 1 Notify RN    HEPARIN LEVEL (UNFRACTIONATED)     Status: Abnormal   Collection Time    07/28/13  5:55 PM      Result Value Range   Heparin Unfractionated 0.28 (*) 0.30 - 0.70 IU/mL   Comment:            IF HEPARIN RESULTS ARE BELOW     EXPECTED VALUES, AND PATIENT     DOSAGE HAS BEEN CONFIRMED,     SUGGEST FOLLOW UP TESTING     OF ANTITHROMBIN III LEVELS.  GLUCOSE, CAPILLARY     Status: Abnormal   Collection Time    07/28/13  7:51 PM      Result Value Range   Glucose-Capillary 231 (*) 70 - 99 mg/dL   Comment 1 Notify RN    HEPARIN LEVEL (UNFRACTIONATED)     Status: None   Collection Time    07/29/13  1:25 AM      Result Value Range   Heparin Unfractionated 0.45  0.30 - 0.70 IU/mL   Comment:            IF HEPARIN RESULTS ARE BELOW     EXPECTED VALUES, AND PATIENT     DOSAGE HAS BEEN CONFIRMED,     SUGGEST FOLLOW UP TESTING     OF ANTITHROMBIN III LEVELS.  CBC     Status: None   Collection Time    07/29/13  1:25 AM      Result Value Range   WBC 9.4  4.0 - 10.5 K/uL   RBC 4.54  4.22 - 5.81 MIL/uL   Hemoglobin 14.2  13.0 - 17.0 g/dL   HCT 16.1  09.6 - 04.5 %   MCV 90.5  78.0 - 100.0 fL   MCH 31.3  26.0 - 34.0 pg   MCHC 34.5  30.0 - 36.0 g/dL   RDW 40.9  81.1 - 91.4 %   Platelets 256  150 - 400 K/uL  PROTIME-INR     Status: Abnormal   Collection Time    07/29/13  1:25 AM      Result Value Range   Prothrombin Time 17.9 (*) 11.6 - 15.2 seconds   INR 1.52 (*) 0.00 - 1.49  GLUCOSE, CAPILLARY     Status: Abnormal   Collection Time    07/29/13  7:50 AM      Result Value Range   Glucose-Capillary 179 (*) 70 - 99 mg/dL  GLUCOSE, CAPILLARY     Status: Abnormal   Collection Time    07/29/13 11:51 AM      Result Value Range   Glucose-Capillary 137 (*) 70 - 99 mg/dL  GLUCOSE, CAPILLARY     Status: Abnormal   Collection Time     07/29/13  4:17 PM  Result Value Range   Glucose-Capillary 135 (*) 70 - 99 mg/dL  GLUCOSE, CAPILLARY     Status: Abnormal   Collection Time    07/29/13  9:04 PM      Result Value Range   Glucose-Capillary 180 (*) 70 - 99 mg/dL  CBC     Status: None   Collection Time    07/30/13  5:45 AM      Result Value Range   WBC 8.9  4.0 - 10.5 K/uL   RBC 4.62  4.22 - 5.81 MIL/uL   Hemoglobin 14.3  13.0 - 17.0 g/dL   HCT 30.8  65.7 - 84.6 %   MCV 90.3  78.0 - 100.0 fL   MCH 31.0  26.0 - 34.0 pg   MCHC 34.3  30.0 - 36.0 g/dL   RDW 96.2  95.2 - 84.1 %   Platelets 206  150 - 400 K/uL  PROTIME-INR     Status: Abnormal   Collection Time    07/30/13  5:45 AM      Result Value Range   Prothrombin Time 18.0 (*) 11.6 - 15.2 seconds   INR 1.53 (*) 0.00 - 1.49  HEPARIN LEVEL (UNFRACTIONATED)     Status: None   Collection Time    07/30/13  5:45 AM      Result Value Range   Heparin Unfractionated 0.65  0.30 - 0.70 IU/mL   Comment:            IF HEPARIN RESULTS ARE BELOW     EXPECTED VALUES, AND PATIENT     DOSAGE HAS BEEN CONFIRMED,     SUGGEST FOLLOW UP TESTING     OF ANTITHROMBIN III LEVELS.  GLUCOSE, CAPILLARY     Status: Abnormal   Collection Time    07/30/13  7:33 AM      Result Value Range   Glucose-Capillary 133 (*) 70 - 99 mg/dL  GLUCOSE, CAPILLARY     Status: Abnormal   Collection Time    07/30/13 11:59 AM      Result Value Range   Glucose-Capillary 145 (*) 70 - 99 mg/dL  GLUCOSE, CAPILLARY     Status: Abnormal   Collection Time    07/30/13  5:30 PM      Result Value Range   Glucose-Capillary 164 (*) 70 - 99 mg/dL  GLUCOSE, CAPILLARY     Status: Abnormal   Collection Time    07/30/13  7:35 PM      Result Value Range   Glucose-Capillary 178 (*) 70 - 99 mg/dL  CBC     Status: Abnormal   Collection Time    07/31/13  5:12 AM      Result Value Range   WBC 6.1  4.0 - 10.5 K/uL   RBC 4.02 (*) 4.22 - 5.81 MIL/uL   Hemoglobin 12.3 (*) 13.0 - 17.0 g/dL   HCT 32.4 (*) 40.1  - 52.0 %   MCV 90.8  78.0 - 100.0 fL   MCH 30.6  26.0 - 34.0 pg   MCHC 33.7  30.0 - 36.0 g/dL   RDW 02.7  25.3 - 66.4 %   Platelets 199  150 - 400 K/uL  GLUCOSE, CAPILLARY     Status: Abnormal   Collection Time    07/31/13  8:10 AM      Result Value Range   Glucose-Capillary 162 (*) 70 - 99 mg/dL  BASIC METABOLIC PANEL     Status: Abnormal   Collection Time  07/31/13  9:45 AM      Result Value Range   Sodium 135  135 - 145 mEq/L   Potassium 4.3  3.5 - 5.1 mEq/L   Chloride 100  96 - 112 mEq/L   CO2 25  19 - 32 mEq/L   Glucose, Bld 165 (*) 70 - 99 mg/dL   BUN 15  6 - 23 mg/dL   Creatinine, Ser 1.61  0.50 - 1.35 mg/dL   Calcium 8.4  8.4 - 09.6 mg/dL   GFR calc non Af Amer 83 (*) >90 mL/min   GFR calc Af Amer >90  >90 mL/min   Comment: (NOTE)     The eGFR has been calculated using the CKD EPI equation.     This calculation has not been validated in all clinical situations.     eGFR's persistently <90 mL/min signify possible Chronic Kidney     Disease.  GLUCOSE, CAPILLARY     Status: Abnormal   Collection Time    07/31/13 12:02 PM      Result Value Range   Glucose-Capillary 128 (*) 70 - 99 mg/dL  BASIC METABOLIC PANEL     Status: Abnormal   Collection Time    08/03/13  4:53 PM      Result Value Range   Sodium 139  135 - 145 mEq/L   Potassium 4.5  3.5 - 5.1 mEq/L   Chloride 99  96 - 112 mEq/L   CO2 32  19 - 32 mEq/L   Glucose, Bld 105 (*) 70 - 99 mg/dL   BUN 23  6 - 23 mg/dL   Creatinine, Ser 1.2  0.4 - 1.5 mg/dL   Calcium 9.7  8.4 - 04.5 mg/dL   GFR 40.98  >11.91 mL/min  GLUCOSE, CAPILLARY     Status: Abnormal   Collection Time    08/05/13  3:42 PM      Result Value Range   Glucose-Capillary 133 (*) 70 - 99 mg/dL  POCT GLYCOSYLATED HEMOGLOBIN (HGB A1C)     Status: None   Collection Time    08/05/13  3:53 PM      Result Value Range   Hemoglobin A1C 6.0      Physical Exam: Constitutional:  BP 102/65  Pulse 76  Ht 5\' 7"  (1.702 m)  Wt 263 lb (119.296 kg)   BMI 41.18 kg/m2  Musculoskeletal: Strength & Muscle Tone: within normal limits Gait & Station: normal Patient leans: N/A  Mental Status Examination;  Patient is a middle-aged man who is morbidly obese, casually dressed and fairly groomed.  He maintains fair eye contact.  His speech is slow but coherent.  He described his mood is anxious and his affect is constricted. He denies any active or passive suicidal thoughts or homicidal thoughts.  He denies any auditory or visual hallucination.  His thought process is slow.   There is no paranoia or delusions of session present at this time.  His fund of knowledge is adequate.  There were no tremors or shakes present.  There were no flight of ideas or any loose association.  His attention and concentration is fair.  He has difficulty expressing his symptoms and he feels very overwhelmed.  He is alert and oriented x3.  His insight judgment and impulse control is okay.   Medical Decision Making (Choose Three): Established Problem, Stable/Improving (1), Review of Psycho-Social Stressors (1), Review or order clinical lab tests (1), Review of Last Therapy Session (1), Review of Medication Regimen &  Side Effects (2) and Review of New Medication or Change in Dosage (2)  Assessment: Axis I: Maj. depressive disorder, recurrent  Axis II: Deferred  Axis III:  Past Medical History  Diagnosis Date  . Hypertension   . Congestive heart failure     No echo data available. Myocardial perfusion 2005 with EF 61%, presumed diastolic HF   . Diverticulosis     with history of diverticulitis in 10/2011  . Gout   . Major depression   . Anxiety   . Psychosexual dysfunction with inhibited sexual excitement   . Hyperlipidemia   . Atrial flutter   . Morbid obesity   . Diabetic peripheral neuropathy   . Type II diabetes mellitus dx'd 1991    previously on insulin in 2000 for 3-4 years (but was stopped because adamantly did not want to be on insulin)  . Chronic  bronchitis     "get it q yr" (07/26/2013)  . Shortness of breath     "all the time" (07/26/2013)  . OSA (obstructive sleep apnea) 1998    previously on CPAP - unable to tolerate; "going to purchase one next week" (07/26/2013)  . Arthritis     involving knees, ankles, back, elbows  . Chronic back pain     Axis IV: Moderate   Plan:  I will increase his Lexapro to 20 mg daily.  Patient is tolerating his medications without any side effects.  I will also schedule an appointment with therapist.  We have not received any records from his previous psychiatrist from [place].  Discussed in detail the risks and benefits of medication and recommend to call us back if he has any question or any concern.  I will see him in 4 weeks.  Time spent 25 minutes.  More than 50% of the time spent in psychoeducation, counseling and coordination of care.  Discuss safety plan that anytime having active suicidal thoughts or homicidal thoughts then patient need to call 911 or go to the local emergency room.   Jestin Burbach T., MD 08/23/2013

## 2013-08-31 ENCOUNTER — Ambulatory Visit (HOSPITAL_COMMUNITY): Payer: Self-pay | Admitting: Psychiatry

## 2013-09-02 ENCOUNTER — Other Ambulatory Visit: Payer: Self-pay | Admitting: *Deleted

## 2013-09-02 ENCOUNTER — Telehealth: Payer: Self-pay | Admitting: Internal Medicine

## 2013-09-02 DIAGNOSIS — M1990 Unspecified osteoarthritis, unspecified site: Secondary | ICD-10-CM

## 2013-09-05 ENCOUNTER — Encounter (HOSPITAL_COMMUNITY): Payer: Self-pay | Admitting: Psychiatry

## 2013-09-05 ENCOUNTER — Ambulatory Visit (INDEPENDENT_AMBULATORY_CARE_PROVIDER_SITE_OTHER): Payer: BC Managed Care – PPO | Admitting: Psychiatry

## 2013-09-05 DIAGNOSIS — F329 Major depressive disorder, single episode, unspecified: Secondary | ICD-10-CM

## 2013-09-05 NOTE — Progress Notes (Signed)
Patient ID: Jeremiah Perkins, male   DOB: Nov 07, 1961, 52 y.o.   MRN: [id number] Presenting Problem Chief Complaint: Depression, irritability  What are the main stressors in your life right now, how long? Waiting on SSI/disability status, unhappy in second marriage, feels trapped in current living situation with his wife, adult daughter-in-law, and wife's ex-husband who visits frequently.  Previous mental health services Have you ever been treated for a mental health problem, when, where, by whom? No    Are you currently seeing a therapist or counselor, counselor's name? No   Have you ever had a mental health hospitalization, how many times, length of stay? No   Have you ever had suicidal thoughts or attempted suicide, when, how? No   Risk factors for Suicide Demographic factors:  Male Current mental status: no reported suicidal ideation Loss factors: Decline in physical health Historical factors: divorce, loss of job, financial stress Risk Reduction factors: Living with another person, especially a relative Clinical factors:  depression Cognitive features that contribute to risk: Closed-mindedness    SUICIDE RISK:  Minimal: No identifiable suicidal ideation.  Patients presenting with no risk factors but with morbid ruminations; may be classified as minimal risk based on the severity of the depressive symptoms  Medical history Medical treatment and/or problems, explain: Yes- heart disease  Do you have any issues with chronic pain?  Yes-peripheral neuropathy    Social/family history Have you been married, how many times?  twice  Do you have children?  2 sons and daughter with first wife  Who lives in your current household? Lives with wife, daughter-in-law  Military history: No   Religious/spiritual involvement:  What religion/faith base are you? Christian  Family of origin (childhood history)  Where were you born? [place] Where did you grow up? [place]  Describe  the atmosphere of the household where you grew up: deferred Do you have siblings, step/half siblings, list names, relation, sex, age? deferred  Are your parents alive? no  Social supports (personal and professional): poor  Education How many grades have you completed? some college Did you have any problems in school, what type? No  Medications prescribed for these problems? No   Employment (financial issues) unemployed  Legal history none  Trauma/Abuse history: Have you ever been exposed to any form of abuse, what type? No  Have you ever been exposed to something traumatic, describe? No   Substance use None reported  Mental Status: General Appearance Luretha Murphy:  Casual Eye Contact:  Minimal Motor Behavior:  Restlestness Speech:  Normal Level of Consciousness:  Alert Mood:  Irritable Affect:  Constricted Anxiety Level:  moderate Thought Process:  Coherent Thought Content:  WNL Perception:  Normal Judgment:  Fair Insight:  Absent Cognition:  wnl  Diagnosis AXIS I Depressive Disorder NOS  AXIS II No diagnosis  AXIS III Past Medical History  Diagnosis Date  . Hypertension   . Congestive heart failure     No echo data available. Myocardial perfusion 2005 with EF 61%, presumed diastolic HF   . Diverticulosis     with history of diverticulitis in 10/2011  . Gout   . Major depression   . Anxiety   . Psychosexual dysfunction with inhibited sexual excitement   . Hyperlipidemia   . Atrial flutter   . Morbid obesity   . Diabetic peripheral neuropathy   . Type II diabetes mellitus dx'd 1991    previously on insulin in 2000 for 3-4 years (but was stopped because adamantly did not want to  be on insulin)  . Chronic bronchitis     "get it q yr" (07/26/2013)  . Shortness of breath     "all the time" (07/26/2013)  . OSA (obstructive sleep apnea) 1998    previously on CPAP - unable to tolerate; "going to purchase one next week" (07/26/2013)  . Arthritis     involving knees,  ankles, back, elbows  . Chronic back pain     AXIS IV other psychosocial or environmental problems  AXIS V 51-60 moderate symptoms   Plan: Pt. To return in 2 weeks for continued assessment.  _________________________________________ Boneta Lucks, Ph.D., LPC, NCC

## 2013-09-06 ENCOUNTER — Telehealth: Payer: Self-pay | Admitting: Cardiology

## 2013-09-06 ENCOUNTER — Other Ambulatory Visit: Payer: Self-pay | Admitting: *Deleted

## 2013-09-06 DIAGNOSIS — E118 Type 2 diabetes mellitus with unspecified complications: Secondary | ICD-10-CM

## 2013-09-06 MED ORDER — LIRAGLUTIDE 18 MG/3ML ~~LOC~~ SOPN: 1.8000 mg | PEN_INJECTOR | Freq: Every day | SUBCUTANEOUS | Status: DC

## 2013-09-06 NOTE — Telephone Encounter (Signed)
During my continuity clinic I received messages several times after refusal to refill patient's wife's narcotics. Jeremiah Perkins then continued to contact refill line and [facility] nurses regarding possibility of refilling his narcotics. Jeremiah Perkins was informed given his complicated recent hospitalization and progression of comorbid conditions that he would need an appt before refills could be given to reassess his pain. The patient doesn't have a pain contract.   After this again several calls were made to the triage nurse and the patient insisted in the provider call him to discuss the situation.   I then contacted the patient informed him of the new policies regarding required paper scripts for narcotics and the insistent nature of an evaluation given the patients medical conditions. Patient was having some hypotension during the last visit and refused to come in for re-evaluation of medications and symptoms. Many times patient doesn't answer phone calls in regarding to following up on these conditions and also patient has a history of non-compliance in the past with medical management plans which cause reluctance to just manage pts symptoms over the phone. All of this was explained to the patient. Pt verbalized understanding and all his questions were answered.

## 2013-09-06 NOTE — Telephone Encounter (Signed)
New Problem  [facility].. Needs to have the fax sent back to their facility to have his medications changed from the old  Dr. Isidore Moos to the [facility] [employer] He states that his nurse is aware of this transition.. Please advise.   Sent this inquiry to medications dept

## 2013-09-06 NOTE — Telephone Encounter (Signed)
Spoke with patient and advised form had been faxed back. Will check on it next week to see if processed.

## 2013-09-08 ENCOUNTER — Ambulatory Visit (INDEPENDENT_AMBULATORY_CARE_PROVIDER_SITE_OTHER): Payer: BC Managed Care – PPO | Admitting: Cardiology

## 2013-09-08 ENCOUNTER — Telehealth (HOSPITAL_COMMUNITY): Payer: Self-pay | Admitting: *Deleted

## 2013-09-08 ENCOUNTER — Encounter: Payer: Self-pay | Admitting: Cardiology

## 2013-09-08 ENCOUNTER — Other Ambulatory Visit (INDEPENDENT_AMBULATORY_CARE_PROVIDER_SITE_OTHER): Payer: BC Managed Care – PPO

## 2013-09-08 VITALS — BP 136/71 | HR 62 | Ht 67.0 in | Wt 255.0 lb

## 2013-09-08 DIAGNOSIS — I10 Essential (primary) hypertension: Secondary | ICD-10-CM

## 2013-09-08 DIAGNOSIS — G609 Hereditary and idiopathic neuropathy, unspecified: Secondary | ICD-10-CM

## 2013-09-08 DIAGNOSIS — I4892 Unspecified atrial flutter: Secondary | ICD-10-CM

## 2013-09-08 DIAGNOSIS — I5022 Chronic systolic (congestive) heart failure: Secondary | ICD-10-CM

## 2013-09-08 DIAGNOSIS — I428 Other cardiomyopathies: Secondary | ICD-10-CM

## 2013-09-08 DIAGNOSIS — F329 Major depressive disorder, single episode, unspecified: Secondary | ICD-10-CM

## 2013-09-08 DIAGNOSIS — G629 Polyneuropathy, unspecified: Secondary | ICD-10-CM

## 2013-09-08 NOTE — Telephone Encounter (Signed)
Called to pharm 10/14

## 2013-09-08 NOTE — Telephone Encounter (Signed)
Spoke with [employer] and order received and will be shipped   Patient id # [id number] Reorder number [id number]

## 2013-09-08 NOTE — Patient Instructions (Addendum)
STOP ASPIRIN  CHANGE YOUR PROTONIX TO DAILY AS NEEDED  Ok to start cardiac rehab after next week  Your physician recommends that you schedule a follow-up appointment in: 2 MONTH OV/EKG

## 2013-09-08 NOTE — Assessment & Plan Note (Signed)
The patient has a history of major depression.  He is on Lexapro.  [facility]

## 2013-09-08 NOTE — Telephone Encounter (Signed)
Melissa from Dr. Sherrian Divers office called in reference to medical records request sent to there office. Melissa wanted to advised that pt has never been seen in there office.

## 2013-09-08 NOTE — Assessment & Plan Note (Signed)
The patient is having less dyspnea.  He is scheduled to start cardiac rehabilitation program week after next.  He has had [facility] the home health nurses checking on him and they will stop next week.  He is watching his diet.  His weight is down 13 pounds since last visit.

## 2013-09-08 NOTE — Progress Notes (Signed)
Jeremiah Perkins Date of Birth:  Feb 14, 1961 [address] [place], [place]  [address] [phone]         Fax   [phone]  History of Present Illness: This pleasant 52 year old African American gentleman is seen for a one month followup office visit.  He was recently hospitalized at [facility] for exacerbation of acute on chronic systolic congestive heart failure complicated by atrial flutter.  He has a history of a nonischemic cardiomyopathy with previous catheterization showing no significant obstructive disease.  His ejection fraction is estimated at 20-25%.  During his recent hospital stay he underwent TEE cardioversion from atrial flutter back to normal sinus rhythm.  He is maintaining normal sinus rhythm.  He is diuresing on his current outpatient regimen.  He still notes significant dyspnea with minimal exertion.  He has not been aware of any recurrent atrial flutter or palpitations.  He does have occasional chest discomfort.  The patient also has a history of diabetes mellitus type 2 which is followed at the internal medicine clinic.  He has a history of obstructive sleep apnea and morbid obesity and diabetic peripheral neuropathy.  He has a past history of hypertension and gout.  Current Outpatient Prescriptions  Medication Sig Dispense Refill  . allopurinol (ZYLOPRIM) 300 MG tablet Take 300 mg by mouth daily.      Marland Kitchen atorvastatin (LIPITOR) 40 MG tablet Take 1 tablet (40 mg total) by mouth daily.  30 tablet  0  . Blood Glucose Monitoring Suppl (AGAMATRIX PRESTO PRO METER) DEVI 1 Device by Does not apply route once.  1 Device  0  . carvedilol (COREG) 6.25 MG tablet TAKE 1 TABLET BY MOUTH TWICE DAILY WITH A MEAL  60 tablet  12  . escitalopram (LEXAPRO) 20 MG tablet Take 1 tablet (20 mg total) by mouth daily.  30 tablet  0  . furosemide (LASIX) 80 MG tablet Take 1 tablet (80 mg total) by mouth daily.  30 tablet  3  . gabapentin (NEURONTIN) 600 MG tablet Take 1-2  tablets (600-1,200 mg total) by mouth 2 (two) times daily. Takes 600mg  in the am & 1200mg  in the pm  90 tablet  12  . glucose blood (AGAMATRIX PRESTO TEST) test strip Use as instructed  100 each  12  . HYDROcodone-acetaminophen (NORCO/VICODIN) 5-325 MG per tablet Take 1 tablet by mouth every 6 (six) hours as needed for pain.  60 tablet  1  . levalbuterol (XOPENEX HFA) 45 MCG/ACT inhaler Inhale 1-2 puffs into the lungs every 4 (four) hours as needed for wheezing.  1 Inhaler  12  . Liraglutide (VICTOZA) 18 MG/3ML SOPN Inject 1.8 mg into the skin daily.  9 pen  3  . LORazepam (ATIVAN) 0.5 MG tablet Take 1 tablet (0.5 mg total) by mouth at bedtime as needed for anxiety. For anxiety  30 tablet  2  . losartan (COZAAR) 25 MG tablet Take 1 tablet (25 mg total) by mouth daily.  30 tablet  3  . metFORMIN (GLUCOPHAGE) 500 MG tablet Take 2 tablets (1,000 mg total) by mouth 2 (two) times daily with a meal.  240 tablet  12  . Multiple Vitamins-Minerals (MEGA MULTIVITAMIN FOR MEN) TABS Take 1 capsule by mouth every morning.  30 tablet  3  . pantoprazole (PROTONIX) 40 MG tablet Take 40 mg by mouth as needed.      . Rivaroxaban (XARELTO) 20 MG TABS tablet Take 1 tablet (20 mg total) by mouth daily with  supper.  30 tablet  3  . sildenafil (VIAGRA) 100 MG tablet Take 100 mg by mouth daily as needed. For ED      . atorvastatin (LIPITOR) 20 MG tablet TAKE 1 TABLET BY MOUTH DAILY AT 6 PM  30 tablet  12  . docusate sodium (COLACE) 100 MG capsule Take 100 mg by mouth daily as needed for constipation. For constipation       No current facility-administered medications for this visit.    Allergies  Allergen Reactions  . Other     Eggs - nausea/vomiting (did ok with the flu shot, seems to be an issue with the type of preparation of the egg product)  . Sulfa Antibiotics Nausea And Vomiting    Patient Active Problem List   Diagnosis Date Noted  . Cough 07/31/2013  . Acute on chronic systolic CHF (congestive heart  failure) 07/31/2013  . Groin mass 07/31/2013  . Long term (current) use of anticoagulants 07/22/2013  . Atrial flutter 07/15/2013  . Abnormality of gait 02/28/2013  . Worsening headaches 01/11/2013  . URI (upper respiratory infection) 11/26/2012  . Boils 11/26/2012  . Morbid obesity 11/11/2012  . Encounter to establish care with new doctor 11/11/2012  . Diabetes mellitus type 2, controlled, with complications   . Diverticulosis   . Arthritis   . Major depression   . Diabetic peripheral neuropathy   . Anxiety   . Psychosexual dysfunction with inhibited sexual excitement   . OSA (obstructive sleep apnea)   . Dyspnea 10/27/2011  . Hypertension   . Chronic systolic heart failure   . Gout     History  Smoking status  . Former Smoker -- 0.12 packs/day for 10 years  . Types: Cigarettes  Smokeless tobacco  . Never Used    Comment: Smokes 3 cigs per week / previously smoked 1ppd x 1-2 years; "nothing since ~ 2010" ; "chewed tobacco as a kid" (07/26/2013)    History  Alcohol Use  . Yes    Comment: 07/26/2013 "dron't drink q wk; only on big sporting events, birthdays, weddings, etc"    Family History  Problem Relation Age of Onset  . Diabetes Mother   . CAD Mother 43    requiring quadruple bypass  . Hypertension Mother   . Alzheimer's disease Mother   . Stomach cancer Father   . Lung cancer Father     was a smoker  . Gout Father   . CAD Brother 48    requiring quadruple bypass  . Seizures Brother   . Hypertension Brother   . Diabetes Maternal Grandmother   . Alzheimer's disease Maternal Grandmother   . Breast cancer Paternal Aunt   . Diabetes Maternal Aunt   . Seizures Paternal Uncle     Review of Systems: Constitutional: no fever chills diaphoresis or fatigue or change in weight.  Head and neck: no hearing loss, no epistaxis, no photophobia or visual disturbance. Respiratory: No cough, shortness of breath or wheezing. Cardiovascular: No chest pain peripheral edema,  palpitations. Gastrointestinal: No abdominal distention, no abdominal pain, no change in bowel habits hematochezia or melena. Genitourinary: No dysuria, no frequency, no urgency, no nocturia. Musculoskeletal:No arthralgias, no back pain, no gait disturbance or myalgias. Neurological: No dizziness, no headaches, no numbness, no seizures, no syncope, no weakness, no tremors. Hematologic: No lymphadenopathy, no easy bruising. Psychiatric: No confusion, no hallucinations, no sleep disturbance.    Physical Exam: Filed Vitals:   09/08/13 1605  BP: 136/71  Pulse: 62  the general appearance reveals a large gentleman in no distress.The head and neck exam reveals pupils equal and reactive.  Extraocular movements are full.  There is no scleral icterus.  The mouth and pharynx are normal.  The neck is supple.  The carotids reveal no bruits.  The jugular venous pressure is normal.  The  thyroid is not enlarged.  There is no lymphadenopathy.  The chest is clear to percussion and auscultation.  There are no rales or rhonchi.  Expansion of the chest is symmetrical.  The precordium is quiet.  The first heart sound is normal.  The second heart sound is physiologically split.  There is no murmur gallop rub or click.  There is no abnormal lift or heave.  The abdomen is soft and nontender.  The bowel sounds are normal.  The liver and spleen are not enlarged.  There are no abdominal masses.  There are no abdominal bruits.  Extremities reveal good pedal pulses.  There is trace peripheral edema..  There is no cyanosis or clubbing.  Strength is normal and symmetrical in all extremities.  There is no lateralizing weakness.  There are no sensory deficits.  The skin is warm and dry.  There is no rash.  EKG shows normal sinus rhythm at 62 per minute with borderline prolongation of PR interval at 202 ms.  QTc interval is 468   Assessment / Plan: The patient is to continue on current medical therapy.  We will stop baby  aspirin since he is on Xarelto and since he does not have coronary disease.  His Protonix can be changed to just when necessary.  He was encouraged to continue his weight loss.  He will start cardiac rehabilitation program after next week.  Recheck in 2 months for followup office visit and EKG.  Continue ongoing medical care and diabetic care with the medical clinic at [facility].

## 2013-09-08 NOTE — Assessment & Plan Note (Signed)
EKG today shows that the patient is maintaining normal sinus rhythm.  He has not had any recurrence of his atrial flutter.  He is on long-term anticoagulation with Xarelto

## 2013-09-14 ENCOUNTER — Telehealth: Payer: Self-pay | Admitting: *Deleted

## 2013-09-14 NOTE — Telephone Encounter (Signed)
Fax from [facility] pharmacy for Victoza  - informed them of refill this morning. Pt called and left message about Victoza refill - called pt back to let him know [facility] was made awared of refill.

## 2013-09-14 NOTE — Telephone Encounter (Signed)
Spoke with patient and advise Viagra at front desk ready for pick up

## 2013-09-15 ENCOUNTER — Inpatient Hospital Stay (HOSPITAL_COMMUNITY): Admission: RE | Admit: 2013-09-15 | Payer: Self-pay | Source: Ambulatory Visit

## 2013-09-19 ENCOUNTER — Encounter (HOSPITAL_COMMUNITY): Payer: BC Managed Care – PPO

## 2013-09-20 ENCOUNTER — Ambulatory Visit (HOSPITAL_COMMUNITY): Payer: Self-pay | Admitting: Psychiatry

## 2013-09-21 ENCOUNTER — Encounter (HOSPITAL_COMMUNITY): Payer: BC Managed Care – PPO

## 2013-09-22 ENCOUNTER — Encounter (HOSPITAL_COMMUNITY)
Admission: RE | Admit: 2013-09-22 | Discharge: 2013-09-22 | Disposition: A | Discharge status: Home / Self Care | Payer: BC Managed Care – PPO | Source: Ambulatory Visit | Attending: Cardiology | Admitting: Cardiology

## 2013-09-22 NOTE — Progress Notes (Signed)
Cardiac Rehab Medication Review by a Pharmacist  Does the patient  feel that his/her medications are working for him/her?  yes  Has the patient been experiencing any side effects to the medications prescribed?  yes Dizziness, fatigue, and lightheadness  Does the patient measure his/her own blood pressure or blood glucose at home?  no   Does the patient have any problems obtaining medications due to transportation or finances?   no  Understanding of regimen: excellent Understanding of indications: fair Potential of compliance: fair   Jeremiah Perkins 09/22/2013 8:41 AM

## 2013-09-23 ENCOUNTER — Encounter (HOSPITAL_COMMUNITY): Payer: BC Managed Care – PPO

## 2013-09-26 ENCOUNTER — Encounter (HOSPITAL_COMMUNITY): Payer: BC Managed Care – PPO

## 2013-09-26 ENCOUNTER — Ambulatory Visit (HOSPITAL_COMMUNITY): Payer: Self-pay | Admitting: Psychiatry

## 2013-09-26 ENCOUNTER — Telehealth (HOSPITAL_COMMUNITY): Payer: Self-pay | Admitting: Internal Medicine

## 2013-09-28 ENCOUNTER — Encounter (HOSPITAL_COMMUNITY)
Admission: RE | Admit: 2013-09-28 | Discharge: 2013-09-28 | Disposition: A | Discharge status: Home / Self Care | Payer: BC Managed Care – PPO | Source: Ambulatory Visit | Attending: Cardiology | Admitting: Cardiology

## 2013-09-28 ENCOUNTER — Encounter (HOSPITAL_COMMUNITY): Payer: BC Managed Care – PPO

## 2013-09-28 DIAGNOSIS — Z8249 Family history of ischemic heart disease and other diseases of the circulatory system: Secondary | ICD-10-CM | POA: Insufficient documentation

## 2013-09-28 DIAGNOSIS — G4733 Obstructive sleep apnea (adult) (pediatric): Secondary | ICD-10-CM | POA: Insufficient documentation

## 2013-09-28 DIAGNOSIS — I10 Essential (primary) hypertension: Secondary | ICD-10-CM | POA: Insufficient documentation

## 2013-09-28 DIAGNOSIS — Z7982 Long term (current) use of aspirin: Secondary | ICD-10-CM | POA: Insufficient documentation

## 2013-09-28 DIAGNOSIS — I4892 Unspecified atrial flutter: Secondary | ICD-10-CM | POA: Insufficient documentation

## 2013-09-28 DIAGNOSIS — F17200 Nicotine dependence, unspecified, uncomplicated: Secondary | ICD-10-CM | POA: Insufficient documentation

## 2013-09-28 DIAGNOSIS — Z5189 Encounter for other specified aftercare: Secondary | ICD-10-CM | POA: Insufficient documentation

## 2013-09-28 DIAGNOSIS — Z79899 Other long term (current) drug therapy: Secondary | ICD-10-CM | POA: Insufficient documentation

## 2013-09-28 DIAGNOSIS — I5032 Chronic diastolic (congestive) heart failure: Secondary | ICD-10-CM | POA: Insufficient documentation

## 2013-09-28 DIAGNOSIS — I509 Heart failure, unspecified: Secondary | ICD-10-CM | POA: Insufficient documentation

## 2013-09-28 DIAGNOSIS — Z8679 Personal history of other diseases of the circulatory system: Secondary | ICD-10-CM | POA: Insufficient documentation

## 2013-09-28 LAB — GLUCOSE, CAPILLARY: Glucose-Capillary: 105 mg/dL — ABNORMAL HIGH (ref 70–99)

## 2013-09-28 NOTE — Progress Notes (Signed)
Pt in today for his first day of exercise.  Pt absent on Monday due to water heater leaked in his attic.  Pt tolerated light exercise with some difficulty particular with ambulating on the track.  Pt fatigued after 3 laps-300 feet.  Pt offered assistive device to use for support during ambulation.  Pt declined.  Pt complained of shortness of breath which is not unusual during exertion.  Pt is very deconditioned.  Will confer with EP to look at alternatives for exercise equipment and modifications.  Monitor showed Sr with small QRS complex occ pvc.  Will review PHQ2 screening tool on next exercise session.  QOL survey results are low  Particularly heath and function and psychosocial/spiritual with moderate low for socioeconomic and family. Will continue to monitor and re assess as needed.

## 2013-09-29 ENCOUNTER — Other Ambulatory Visit (HOSPITAL_COMMUNITY): Payer: Self-pay | Admitting: Psychiatry

## 2013-09-30 ENCOUNTER — Encounter (HOSPITAL_COMMUNITY)
Admission: RE | Admit: 2013-09-30 | Discharge: 2013-09-30 | Disposition: A | Discharge status: Home / Self Care | Payer: BC Managed Care – PPO | Source: Ambulatory Visit | Attending: Cardiology | Admitting: Cardiology

## 2013-09-30 ENCOUNTER — Encounter (HOSPITAL_COMMUNITY): Payer: BC Managed Care – PPO

## 2013-09-30 ENCOUNTER — Other Ambulatory Visit (HOSPITAL_COMMUNITY): Payer: Self-pay | Admitting: Psychiatry

## 2013-09-30 LAB — GLUCOSE, CAPILLARY: Glucose-Capillary: 107 mg/dL — ABNORMAL HIGH (ref 70–99)

## 2013-10-03 ENCOUNTER — Encounter (HOSPITAL_COMMUNITY): Payer: BC Managed Care – PPO

## 2013-10-03 ENCOUNTER — Ambulatory Visit (HOSPITAL_COMMUNITY)
Admission: RE | Admit: 2013-10-03 | Discharge: 2013-10-03 | Disposition: A | Discharge status: Home / Self Care | Payer: BC Managed Care – PPO | Source: Ambulatory Visit | Attending: Cardiology | Admitting: Cardiology

## 2013-10-03 ENCOUNTER — Telehealth: Payer: Self-pay | Admitting: Cardiology

## 2013-10-03 ENCOUNTER — Encounter (HOSPITAL_COMMUNITY)
Admission: RE | Admit: 2013-10-03 | Discharge: 2013-10-03 | Disposition: A | Discharge status: Home / Self Care | Payer: BC Managed Care – PPO | Source: Ambulatory Visit | Attending: Cardiology | Admitting: Cardiology

## 2013-10-03 DIAGNOSIS — R9431 Abnormal electrocardiogram [ECG] [EKG]: Secondary | ICD-10-CM | POA: Insufficient documentation

## 2013-10-03 DIAGNOSIS — I4891 Unspecified atrial fibrillation: Secondary | ICD-10-CM | POA: Insufficient documentation

## 2013-10-03 DIAGNOSIS — I4892 Unspecified atrial flutter: Secondary | ICD-10-CM

## 2013-10-03 NOTE — Progress Notes (Signed)
Pt returned today for exercise.  Pt placed on monitor noted to be afib/flutter.  !2 lead EKG obtained for verification/interpretation.  Pt denies any symptoms but reports that he is under a great deal of stress.  Pt's daughter is in the hospital here at [facility].  Pt's daughter has cerebral palsy and a brain shunt.  Pt denies any continued shortness of breath other than usual.  Pt states after prodding that he has felt his heart beat fast over the last couple of days since his daughter was hospitalized on last Wednesday.  Pt placed in treatment room with O2 applied at 2L nasal cannula.  12 lead confirmed afib bp 128/80.  Called and spoke to [place], triage RN at [facility].  Updated with assessment of pt symptoms, reviewed 12 lead.  Heather conferred with Dr. Eden Emms, doctor of the day.  Plan to increase correg to 12.5mg  in the morning and 6.25mg  in the evening.  Pt will also see Dr. Patty Sermons in the office on Wednesday afternoon at 3:30.  Pt verbalized understanding of the new medication regimen and follow up appt.  Pt is in agreement with this. Pt discharged from the department with no complaints.  Pt planning to go to see his daughter who he anticipates discharge today. Will fax order form for 12 lead ekg for Dr. Eden Emms to sign. Alanson Aly, BSN

## 2013-10-03 NOTE — Telephone Encounter (Signed)
Call received from [facility] at [facility] cardiac rehab. She reports that the patient was in for his session today in the monitored program and looked like his HR was irregular. 12 lead EKG was obtained. The patient is in a-flutter at 101 bpm. His only complaint is indigestion over the weekend. He thinks his heart may have gone out of rhythm last Wednesday, but he was in rehab Wednesday and Friday and his rhythm was ok per cardiac rehab. BP today is 122/80. He is s/p TEE/ DCCV on 07/28/13. He is on Xarelto. Reviewed with Dr. Eden Emms (DOD). Orders received to have the patient increase coreg to 12.5 mg in the am and 6.25 mg in the pm and follow up with Dr. Patty Sermons. I have discussed this with Carlette. I have suggested the patient take an extra coreg 6.25 mg when he gets home today, then his regular 6.25 mg tonight. He will start the new dose tomorrow morning. Appointment has been made with Dr. Patty Sermons 11/12 @ 3:30 pm.

## 2013-10-05 ENCOUNTER — Encounter (HOSPITAL_COMMUNITY): Payer: BC Managed Care – PPO

## 2013-10-05 ENCOUNTER — Ambulatory Visit (INDEPENDENT_AMBULATORY_CARE_PROVIDER_SITE_OTHER): Payer: BC Managed Care – PPO | Admitting: Cardiology

## 2013-10-05 ENCOUNTER — Encounter: Payer: Self-pay | Admitting: Cardiology

## 2013-10-05 VITALS — BP 122/66 | HR 76 | Ht 67.75 in | Wt 253.0 lb

## 2013-10-05 DIAGNOSIS — G629 Polyneuropathy, unspecified: Secondary | ICD-10-CM

## 2013-10-05 DIAGNOSIS — I5022 Chronic systolic (congestive) heart failure: Secondary | ICD-10-CM

## 2013-10-05 DIAGNOSIS — I428 Other cardiomyopathies: Secondary | ICD-10-CM

## 2013-10-05 DIAGNOSIS — G609 Hereditary and idiopathic neuropathy, unspecified: Secondary | ICD-10-CM

## 2013-10-05 DIAGNOSIS — I4892 Unspecified atrial flutter: Secondary | ICD-10-CM

## 2013-10-05 DIAGNOSIS — I10 Essential (primary) hypertension: Secondary | ICD-10-CM

## 2013-10-05 DIAGNOSIS — F329 Major depressive disorder, single episode, unspecified: Secondary | ICD-10-CM

## 2013-10-05 MED ORDER — AMIODARONE HCL 200 MG PO TABS: 200.0000 mg | ORAL_TABLET | Freq: Two times a day (BID) | ORAL | Status: DC

## 2013-10-05 NOTE — Progress Notes (Signed)
Jeremiah Perkins Date of Birth:  06/30/1961 [address] [place], [place]  [address] [phone]         Fax   [phone]  History of Present Illness: This pleasant 52 year old African American gentleman is seen for a work in office visit.  He was recently hospitalized at [facility] for exacerbation of acute on chronic systolic congestive heart failure complicated by atrial flutter.  He has a history of a nonischemic cardiomyopathy with previous catheterization showing no significant obstructive disease.  His ejection fraction is estimated at 20-25%.  During his recent hospital stay he underwent TEE cardioversion from atrial flutter back to normal sinus rhythm.  He had been maintaining normal sinus rhythm.  He is diuresing on his current outpatient regimen.  He still notes significant dyspnea with minimal exertion.  He has not been aware of any recurrent atrial flutter or palpitations.  He does have occasional chest discomfort.  The patient also has a history of diabetes mellitus type 2 which is followed at the internal medicine clinic.  He has a history of obstructive sleep apnea and morbid obesity and diabetic peripheral neuropathy.  He has a past history of hypertension and gout. Last week he started the outpatient [facility] cardiac rehabilitation program.  On his third session, 2 days ago, the patient was noted to have an irregular pulse and EKG showed that he was back in atrial flutter fibrillation.  He has been on long-term Xarelto fortunately.  He comes in now for further evaluation of his new arrhythmia.  Current Outpatient Prescriptions  Medication Sig Dispense Refill  . allopurinol (ZYLOPRIM) 300 MG tablet Take 300 mg by mouth daily.      Marland Kitchen atorvastatin (LIPITOR) 20 MG tablet TAKE 1 TABLET BY MOUTH DAILY AT 6 PM  30 tablet  12  . atorvastatin (LIPITOR) 40 MG tablet Take 1 tablet (40 mg total) by mouth daily.  30 tablet  0  . Blood Glucose Monitoring Suppl (AGAMATRIX  PRESTO PRO METER) DEVI 1 Device by Does not apply route once.  1 Device  0  . carvedilol (COREG) 6.25 MG tablet Take two tablets in the morning and one tablet in the evening      . docusate sodium (COLACE) 100 MG capsule Take 100 mg by mouth daily as needed for constipation. For constipation      . escitalopram (LEXAPRO) 20 MG tablet Take 1 tablet (20 mg total) by mouth daily.  30 tablet  0  . furosemide (LASIX) 80 MG tablet Take 1 tablet (80 mg total) by mouth daily.  30 tablet  3  . gabapentin (NEURONTIN) 600 MG tablet Take 1-2 tablets (600-1,200 mg total) by mouth 2 (two) times daily. Takes 600mg  in the am & 1200mg  in the pm  90 tablet  12  . glucose blood (AGAMATRIX PRESTO TEST) test strip Use as instructed  100 each  12  . HYDROcodone-acetaminophen (NORCO/VICODIN) 5-325 MG per tablet Take 1 tablet by mouth every 6 (six) hours as needed for pain.  60 tablet  1  . levalbuterol (XOPENEX HFA) 45 MCG/ACT inhaler Inhale 1-2 puffs into the lungs every 4 (four) hours as needed for wheezing.  1 Inhaler  12  . Liraglutide (VICTOZA) 18 MG/3ML SOPN Inject 1.8 mg into the skin daily.  9 pen  3  . LORazepam (ATIVAN) 0.5 MG tablet Take 1 tablet (0.5 mg total) by mouth at bedtime as needed for anxiety. For anxiety  30 tablet  2  .  losartan (COZAAR) 25 MG tablet Take 1 tablet (25 mg total) by mouth daily.  30 tablet  3  . metFORMIN (GLUCOPHAGE) 500 MG tablet Take 2 tablets (1,000 mg total) by mouth 2 (two) times daily with a meal.  240 tablet  12  . Multiple Vitamins-Minerals (MEGA MULTIVITAMIN FOR MEN) TABS Take 1 capsule by mouth every morning.  30 tablet  3  . pantoprazole (PROTONIX) 40 MG tablet Take 40 mg by mouth as needed.      . Rivaroxaban (XARELTO) 20 MG TABS tablet Take 1 tablet (20 mg total) by mouth daily with supper.  30 tablet  3  . sildenafil (VIAGRA) 100 MG tablet Take 100 mg by mouth daily as needed. For ED      . amiodarone (PACERONE) 200 MG tablet Take 1 tablet (200 mg total) by mouth 2  (two) times daily.  60 tablet  1   No current facility-administered medications for this visit.    Allergies  Allergen Reactions  . Eggs Or Egg-Derived Products Nausea And Vomiting    Did ok with flu shot, seems to be an issue with the type of preparation of egg product.  . Sulfa Antibiotics Nausea And Vomiting    Patient Active Problem List   Diagnosis Date Noted  . Cough 07/31/2013  . Acute on chronic systolic CHF (congestive heart failure) 07/31/2013  . Groin mass 07/31/2013  . Long term (current) use of anticoagulants 07/22/2013  . Atrial flutter 07/15/2013  . Abnormality of gait 02/28/2013  . Worsening headaches 01/11/2013  . URI (upper respiratory infection) 11/26/2012  . Boils 11/26/2012  . Morbid obesity 11/11/2012  . Encounter to establish care with new doctor 11/11/2012  . Diabetes mellitus type 2, controlled, with complications   . Diverticulosis   . Arthritis   . Major depression   . Diabetic peripheral neuropathy   . Anxiety   . Psychosexual dysfunction with inhibited sexual excitement   . OSA (obstructive sleep apnea)   . Dyspnea 10/27/2011  . Hypertension   . Chronic systolic heart failure   . Gout     History  Smoking status  . Former Smoker -- 0.12 packs/day for 10 years  . Types: Cigarettes  Smokeless tobacco  . Never Used    Comment: Smokes 3 cigs per week / previously smoked 1ppd x 1-2 years; "nothing since ~ 2010" ; "chewed tobacco as a kid" (07/26/2013)    History  Alcohol Use  . Yes    Comment: 07/26/2013 "dron't drink q wk; only on big sporting events, birthdays, weddings, etc"    Family History  Problem Relation Age of Onset  . Diabetes Mother   . CAD Mother 62    requiring quadruple bypass  . Hypertension Mother   . Alzheimer's disease Mother   . Stomach cancer Father   . Lung cancer Father     was a smoker  . Gout Father   . CAD Brother 48    requiring quadruple bypass  . Seizures Brother   . Hypertension Brother   . Diabetes  Maternal Grandmother   . Alzheimer's disease Maternal Grandmother   . Breast cancer Paternal Aunt   . Diabetes Maternal Aunt   . Seizures Paternal Uncle     Review of Systems: Constitutional: no fever chills diaphoresis or fatigue or change in weight.  Head and neck: no hearing loss, no epistaxis, no photophobia or visual disturbance. Respiratory: No cough, shortness of breath or wheezing. Cardiovascular: No chest pain peripheral  edema, palpitations. Gastrointestinal: No abdominal distention, no abdominal pain, no change in bowel habits hematochezia or melena. Genitourinary: No dysuria, no frequency, no urgency, no nocturia. Musculoskeletal:No arthralgias, no back pain, no gait disturbance or myalgias. Neurological: No dizziness, no headaches, no numbness, no seizures, no syncope, no weakness, no tremors. Hematologic: No lymphadenopathy, no easy bruising. Psychiatric: No confusion, no hallucinations, no sleep disturbance.    Physical Exam: Filed Vitals:   10/05/13 1548  BP: 122/66  Pulse: 76   the general appearance reveals a large gentleman in no distress.The head and neck exam reveals pupils equal and reactive.  Extraocular movements are full.  There is no scleral icterus.  The mouth and pharynx are normal.  The neck is supple.  The carotids reveal no bruits.  The jugular venous pressure is normal.  The  thyroid is not enlarged.  There is no lymphadenopathy.  The chest is clear to percussion and auscultation.  There are no rales or rhonchi.  Expansion of the chest is symmetrical.  The precordium is quiet.  The first heart sound is normal.  The second heart sound is physiologically split.  There is no murmur gallop rub or click.  There is no abnormal lift or heave.  The abdomen is soft and nontender.  The bowel sounds are normal.  The liver and spleen are not enlarged.  There are no abdominal masses.  There are no abdominal bruits.  Extremities reveal good pedal pulses.  There is trace  peripheral edema..  There is no cyanosis or clubbing.  Strength is normal and symmetrical in all extremities.  There is no lateralizing weakness.  There are no sensory deficits.  The skin is warm and dry.  There is no rash.  EKG shows atrial flutter with ventricular response of 100.  There is low voltage QRS   Assessment / Plan: The patient is to continue on current medical therapy.  We will add amiodarone 200 mg twice a day.  Return in 2 weeks for followup office visit EKG basal metabolic panel CBC hepatic function panel and TSH.  Continue ongoing medical care and diabetic care with the medical clinic at [facility].

## 2013-10-05 NOTE — Patient Instructions (Signed)
Your physician has recommended you make the following change in your medication:  1) Start Amiodarone 200 mg twice daily   Your physician recommends that you schedule a follow-up appointment in: 2 weeks with Dr. Patty Sermons (you will have an EKG at this visit)  Your physician recommends that you return for lab work in: 2 weeks at follow up with Dr. Patty Sermons

## 2013-10-05 NOTE — Assessment & Plan Note (Signed)
EKG today shows recurrent atrial flutter with ventricular response of 100.  He is on Xarelto.  He has been aware that his heart being irregular and has had less stamina and does not feel as strong as he did when he was in sinus rhythm.  We will add amiodarone 200 mg twice a day and plan to see him back in several weeks.  Consider subsequent outpatient cardioversion if he has not converted by then.  We will check blood work when he returns in 2 weeks

## 2013-10-05 NOTE — Assessment & Plan Note (Signed)
The patient has not had any acute deterioration of his breathing since going back into atrial flutter.  No orthopnea or paroxysmal nocturnal dyspnea or recurrent ankle edema.

## 2013-10-06 ENCOUNTER — Ambulatory Visit (HOSPITAL_COMMUNITY): Payer: Self-pay | Admitting: Psychiatry

## 2013-10-07 ENCOUNTER — Encounter: Payer: Self-pay | Admitting: Internal Medicine

## 2013-10-07 ENCOUNTER — Encounter (HOSPITAL_COMMUNITY): Payer: BC Managed Care – PPO

## 2013-10-07 ENCOUNTER — Ambulatory Visit (INDEPENDENT_AMBULATORY_CARE_PROVIDER_SITE_OTHER): Payer: BC Managed Care – PPO | Admitting: Internal Medicine

## 2013-10-07 VITALS — BP 104/67 | HR 90 | Temp 96.1°F | Ht 67.0 in | Wt 260.2 lb

## 2013-10-07 DIAGNOSIS — E118 Type 2 diabetes mellitus with unspecified complications: Secondary | ICD-10-CM

## 2013-10-07 DIAGNOSIS — Z0000 Encounter for general adult medical examination without abnormal findings: Secondary | ICD-10-CM

## 2013-10-07 DIAGNOSIS — I4892 Unspecified atrial flutter: Secondary | ICD-10-CM

## 2013-10-07 DIAGNOSIS — M109 Gout, unspecified: Secondary | ICD-10-CM

## 2013-10-07 DIAGNOSIS — I5022 Chronic systolic (congestive) heart failure: Secondary | ICD-10-CM

## 2013-10-07 DIAGNOSIS — I10 Essential (primary) hypertension: Secondary | ICD-10-CM

## 2013-10-07 LAB — HM DIABETES EYE EXAM

## 2013-10-07 LAB — GLUCOSE, CAPILLARY: Glucose-Capillary: 100 mg/dL — ABNORMAL HIGH (ref 70–99)

## 2013-10-07 MED ORDER — ALLOPURINOL 300 MG PO TABS: 300.0000 mg | ORAL_TABLET | Freq: Every day | ORAL | Status: DC

## 2013-10-07 NOTE — Assessment & Plan Note (Signed)
-  will take retinal picture today -Patient reports that he had a colonoscopy 3 years ago which showed polyp. He was told to repeat the colonoscopy in 5 years.

## 2013-10-07 NOTE — Patient Instructions (Signed)
1. You have done great job in taking all your medications. I appreciate it very much. Please continue doing that. 2. Please take all medications as prescribed.  3. If you have worsening of your symptoms or new symptoms arise, please call the clinic ([phone]), or go to the ER immediately if symptoms are severe.     

## 2013-10-07 NOTE — Assessment & Plan Note (Signed)
2-D echo on 07/28/13 showed EF 25-30%. She is currently taking Lasix 80 mg daily, and Coreg, Cozaar 25 mg daily. He has trace amount of leg edema, but no JVD. Lung auscultation is clear bilaterally. His BW seems to be elevated by 7 LBs, but patient reports that he wears more clothes due to the cold weather today. Patient is clinically euvolemic today. Will continue current regimen.

## 2013-10-07 NOTE — Assessment & Plan Note (Signed)
Patient has recurrence of A flutter after cardioversion to SR. Currently his heart rate is well controlled. Today blood pressure is normal. Patient has been followed up by Dr. Patty Sermons, cardiologist. He is currently taking Coreg 6.25 mg twice a day and amiodarone 200 mg twice a day. Patient is also on Xarelto. He does not have bleeding tendency. Will continue current regimen and follow up with Dr. Patty Sermons in 2 weeks.

## 2013-10-07 NOTE — Assessment & Plan Note (Signed)
Lab Results  Component Value Date   HGBA1C 6.0 08/05/2013   HGBA1C 6.1 07/08/2013   HGBA1C 6.9 02/18/2013     Assessment: Diabetes control: good control (HgbA1C at goal) Progress toward A1C goal:  at goal Comments:   Plan: Medications:  continue current medications Home glucose monitoring: Frequency:   Timing:   Instruction/counseling given: reminded to bring blood glucose meter & log to each visit Educational resources provided: brochure;handout Self management tools provided:   Other plans: It is well controlled. Will continue current regimen.

## 2013-10-07 NOTE — Progress Notes (Signed)
Patient ID: Jeremiah Perkins, male   DOB: [birth year], 52 y.o.   MRN: [id number] Subjective:   Patient ID: Jeremiah Perkins male   DOB: 1961/07/25 52 y.o.   MRN: [id number]  CC:   Follow up visit.        HPI:  [name] is a 52 y.o. man with past medical history as outlined below, who presents for a followup visit today  1.  CHF: 2-D echo on 07/28/13 showed EF 25-30%. It is secondary to NICM. Patient has been followed up by the cardiologist. He was last seen on 10/05/13 by Dr. Patty Perkins. Patient is currently on Coreg 6.25 mg twice a day, Lasix 80 mg daily and Cozaar 25 mg daily. Today, patient feels fine. He has baseline shortness of breath which has not been changed. He does not have chest pain, cough, palpitations.   2. Atrial flutter: Patient was recently hospitalized and underwent TEE conversion from atrial flutter back to normal sinus rhythm rate. Recently she has been doing cardiac rehabilitation. Unfortunately on the third session of cardiac rehabilitation, patient was found to have recurrent A. flutter.He was evaluated by Dr. Patty Perkins on 10/05/13. Amiodarone 200 mg twice a day was started. Currently he is on amiodarone and Coreg. Per Dr. Yevonne Perkins note, patient will be followed up in 2 weeks. Patient is also on Xarelto. He does not have bleeding tendency.   3. DM-II: Currently patient is on 2 oral medications, including Victoza 1.8 mg daily, and metformin 1000 mg twice a day. Patient's A1c was 6.0 on 08/05/13. Patient reports that he never had low blood sugar level.  4. HTN:  Patient is on Cozaar 25 mg daily. She is also on Lasix and Coreg which also for congestive heart failure. Today his blood pressure is 104/67.   ROS:  Denies fever, chills, fatigue, headaches,  cough, chest pain,  abdominal pain, diarrhea, constipation, dysuria, urgency, frequency, hematuria.    Past Medical History  Diagnosis Date  . Hypertension   . Congestive heart failure     No echo data available.  Myocardial perfusion 2005 with EF 61%, presumed diastolic HF   . Diverticulosis     with history of diverticulitis in 10/2011  . Gout   . Major depression   . Anxiety   . Psychosexual dysfunction with inhibited sexual excitement   . Hyperlipidemia   . Atrial flutter   . Morbid obesity   . Diabetic peripheral neuropathy   . Type II diabetes mellitus dx'd 1991    previously on insulin in 2000 for 3-4 years (but was stopped because adamantly did not want to be on insulin)  . Chronic bronchitis     "get it q yr" (07/26/2013)  . Shortness of breath     "all the time" (07/26/2013)  . OSA (obstructive sleep apnea) 1998    previously on CPAP - unable to tolerate; "going to purchase one next week" (07/26/2013)  . Arthritis     involving knees, ankles, back, elbows  . Chronic back pain    Current Outpatient Prescriptions  Medication Sig Dispense Refill  . allopurinol (ZYLOPRIM) 300 MG tablet Take 300 mg by mouth daily.      Marland Kitchen amiodarone (PACERONE) 200 MG tablet Take 1 tablet (200 mg total) by mouth 2 (two) times daily.  60 tablet  1  . atorvastatin (LIPITOR) 20 MG tablet TAKE 1 TABLET BY MOUTH DAILY AT 6 PM  30 tablet  12  . atorvastatin (LIPITOR) 40 MG tablet Take  1 tablet (40 mg total) by mouth daily.  30 tablet  0  . Blood Glucose Monitoring Suppl (AGAMATRIX PRESTO PRO METER) DEVI 1 Device by Does not apply route once.  1 Device  0  . carvedilol (COREG) 6.25 MG tablet Take two tablets in the morning and one tablet in the evening      . docusate sodium (COLACE) 100 MG capsule Take 100 mg by mouth daily as needed for constipation. For constipation      . escitalopram (LEXAPRO) 20 MG tablet Take 1 tablet (20 mg total) by mouth daily.  30 tablet  0  . furosemide (LASIX) 80 MG tablet Take 1 tablet (80 mg total) by mouth daily.  30 tablet  3  . gabapentin (NEURONTIN) 600 MG tablet Take 1-2 tablets (600-1,200 mg total) by mouth 2 (two) times daily. Takes 600mg  in the am & 1200mg  in the pm  90 tablet   12  . glucose blood (AGAMATRIX PRESTO TEST) test strip Use as instructed  100 each  12  . HYDROcodone-acetaminophen (NORCO/VICODIN) 5-325 MG per tablet Take 1 tablet by mouth every 6 (six) hours as needed for pain.  60 tablet  1  . levalbuterol (XOPENEX HFA) 45 MCG/ACT inhaler Inhale 1-2 puffs into the lungs every 4 (four) hours as needed for wheezing.  1 Inhaler  12  . Liraglutide (VICTOZA) 18 MG/3ML SOPN Inject 1.8 mg into the skin daily.  9 pen  3  . LORazepam (ATIVAN) 0.5 MG tablet Take 1 tablet (0.5 mg total) by mouth at bedtime as needed for anxiety. For anxiety  30 tablet  2  . losartan (COZAAR) 25 MG tablet Take 1 tablet (25 mg total) by mouth daily.  30 tablet  3  . metFORMIN (GLUCOPHAGE) 500 MG tablet Take 2 tablets (1,000 mg total) by mouth 2 (two) times daily with a meal.  240 tablet  12  . Multiple Vitamins-Minerals (MEGA MULTIVITAMIN FOR MEN) TABS Take 1 capsule by mouth every morning.  30 tablet  3  . pantoprazole (PROTONIX) 40 MG tablet Take 40 mg by mouth as needed.      . Rivaroxaban (XARELTO) 20 MG TABS tablet Take 1 tablet (20 mg total) by mouth daily with supper.  30 tablet  3  . sildenafil (VIAGRA) 100 MG tablet Take 100 mg by mouth daily as needed. For ED       No current facility-administered medications for this visit.   Family History  Problem Relation Age of Onset  . Diabetes Mother   . CAD Mother 1    requiring quadruple bypass  . Hypertension Mother   . Alzheimer's disease Mother   . Stomach cancer Father   . Lung cancer Father     was a smoker  . Gout Father   . CAD Brother 48    requiring quadruple bypass  . Seizures Brother   . Hypertension Brother   . Diabetes Maternal Grandmother   . Alzheimer's disease Maternal Grandmother   . Breast cancer Paternal Aunt   . Diabetes Maternal Aunt   . Seizures Paternal Uncle    History   Social History  . Marital Status: Married    Spouse Name: N/A    Number of Children: 3  . Years of Education: AA    Occupational History  . Now unemployed     Barrister's clerk at [employer]   Social History Main Topics  . Smoking status: Former Smoker -- 0.12 packs/day for 10 years  Types: Cigarettes  . Smokeless tobacco: Never Used     Comment: Smokes 3 cigs per week / previously smoked 1ppd x 1-2 years; "nothing since ~ 2010" ; "chewed tobacco as a kid" (07/26/2013)  . Alcohol Use: Yes     Comment: 07/26/2013 "dron't drink q wk; only on big sporting events, birthdays, weddings, etc"  . Drug Use: Yes    Special: Marijuana     Comment: 07/26/2013 "smoked weed in high school and college; nothing for years"  . Sexual Activity: Yes   Other Topics Concern  . 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 They each have children but none between the two of them.    Review of Systems: Full 14-point review of systems otherwise negative. See HPI.   Objective:  Physical Exam: Filed Vitals:   10/07/13 1426  BP: 104/67  Pulse: 90  Temp: 96.1 F (35.6 C)  TempSrc: Oral  Height: 5\' 7"  (1.702 m)  Weight: 260 lb 3.2 oz (118.026 kg)  SpO2: 98%   General: Not in acute distress HEENT: PERRL, EOMI, no scleral icterus, No JVD or bruit Cardiac: S1/S2, RRR, No murmurs, gallops or rubs Pulm: Good air movement bilaterally. Clear to auscultation bilaterally. No rales, wheezing, rhonchi or rubs. Abd: Soft, nondistended, nontender, no rebound pain, no organomegaly, BS present Ext: trace leg edema bilaterally. 2+DP/PT pulse bilaterally Musculoskeletal: No joint deformities, erythema, or stiffness, ROM full Skin: No rashes.  Neuro: Alert and oriented X3, cranial nerves II-XII grossly intact, muscle strength 5/5 in all extremeties, sensation to light touch intact.  Psych: Patient is not psychotic, no suicidal or hemocidal ideation.  Assessment & Plan:

## 2013-10-07 NOTE — Assessment & Plan Note (Signed)
BP Readings from Last 3 Encounters:  10/07/13 104/67  10/05/13 122/66  09/22/13 122/80    Lab Results  Component Value Date   NA 139 08/03/2013   K 4.5 08/03/2013   CREATININE 1.2 08/03/2013    Assessment: Blood pressure control: controlled Progress toward BP goal:  at goal Comments:   Plan: Medications:  continue current medications Educational resources provided: brochure Self management tools provided: home blood pressure logbook Other plans: it is well controlled. Will continue current regimen

## 2013-10-09 ENCOUNTER — Other Ambulatory Visit: Payer: Self-pay | Admitting: Internal Medicine

## 2013-10-09 DIAGNOSIS — M1990 Unspecified osteoarthritis, unspecified site: Secondary | ICD-10-CM

## 2013-10-09 MED ORDER — HYDROCODONE-ACETAMINOPHEN 5-325 MG PO TABS: 1.0000 | ORAL_TABLET | Freq: Four times a day (QID) | ORAL | Status: DC | PRN

## 2013-10-10 ENCOUNTER — Encounter (HOSPITAL_COMMUNITY): Payer: BC Managed Care – PPO

## 2013-10-10 ENCOUNTER — Other Ambulatory Visit: Payer: Self-pay | Admitting: *Deleted

## 2013-10-10 ENCOUNTER — Telehealth: Payer: Self-pay | Admitting: *Deleted

## 2013-10-10 MED ORDER — LIRAGLUTIDE 18 MG/3ML ~~LOC~~ SOPN: 1.8000 mg | PEN_INJECTOR | Freq: Every day | SUBCUTANEOUS | Status: DC

## 2013-10-10 NOTE — Telephone Encounter (Signed)
Hydrocodone rx ready - pt informed.

## 2013-10-10 NOTE — Telephone Encounter (Signed)
Pt has insurance now, need new rx sent to [facility].

## 2013-10-11 NOTE — Progress Notes (Signed)
Case discussed with Dr. Niu soon after the resident saw the patient.  We reviewed the resident's history and exam and pertinent patient test results.  I agree with the assessment, diagnosis, and plan of care documented in the resident's note. 

## 2013-10-11 NOTE — Telephone Encounter (Signed)
Victoza rx called to [facility].

## 2013-10-12 ENCOUNTER — Encounter (HOSPITAL_COMMUNITY): Payer: BC Managed Care – PPO

## 2013-10-13 NOTE — Addendum Note (Signed)
Addended by: Lily Kernen N on: 10/13/2013 09:50 AM   Modules accepted: Orders  

## 2013-10-14 ENCOUNTER — Encounter (HOSPITAL_COMMUNITY): Payer: BC Managed Care – PPO

## 2013-10-14 ENCOUNTER — Other Ambulatory Visit: Payer: Self-pay | Admitting: *Deleted

## 2013-10-14 NOTE — Telephone Encounter (Signed)
Fax fro [facility] for pen needles.  Thanks

## 2013-10-15 MED ORDER — INSULIN PEN NEEDLE 31G X 8 MM MISC: Status: DC

## 2013-10-17 ENCOUNTER — Encounter (HOSPITAL_COMMUNITY): Payer: BC Managed Care – PPO

## 2013-10-17 ENCOUNTER — Other Ambulatory Visit: Payer: BC Managed Care – PPO

## 2013-10-17 ENCOUNTER — Encounter: Payer: Self-pay | Admitting: Cardiology

## 2013-10-17 ENCOUNTER — Ambulatory Visit (INDEPENDENT_AMBULATORY_CARE_PROVIDER_SITE_OTHER): Payer: BC Managed Care – PPO | Admitting: Cardiology

## 2013-10-17 VITALS — BP 115/61 | HR 118 | Ht 67.0 in | Wt 236.0 lb

## 2013-10-17 DIAGNOSIS — I5022 Chronic systolic (congestive) heart failure: Secondary | ICD-10-CM

## 2013-10-17 DIAGNOSIS — I10 Essential (primary) hypertension: Secondary | ICD-10-CM

## 2013-10-17 DIAGNOSIS — I428 Other cardiomyopathies: Secondary | ICD-10-CM

## 2013-10-17 DIAGNOSIS — I4892 Unspecified atrial flutter: Secondary | ICD-10-CM

## 2013-10-17 LAB — PROTIME-INR
INR: 1.8 ratio — ABNORMAL HIGH (ref 0.8–1.0)
Prothrombin Time: 19.2 s — ABNORMAL HIGH (ref 10.2–12.4)

## 2013-10-17 LAB — HEPATIC FUNCTION PANEL
ALT: 18 U/L (ref 0–53)
AST: 24 U/L (ref 0–37)
Albumin: 3.4 g/dL — ABNORMAL LOW (ref 3.5–5.2)
Alkaline Phosphatase: 59 U/L (ref 39–117)
Bilirubin, Direct: 0.1 mg/dL (ref 0.0–0.3)
Total Bilirubin: 0.9 mg/dL (ref 0.3–1.2)

## 2013-10-17 LAB — CBC
HCT: 42.5 % (ref 39.0–52.0)
Hemoglobin: 14.3 g/dL (ref 13.0–17.0)
RBC: 4.66 Mil/uL (ref 4.22–5.81)
RDW: 14.8 % — ABNORMAL HIGH (ref 11.5–14.6)
WBC: 6.9 10*3/uL (ref 4.5–10.5)

## 2013-10-17 LAB — BASIC METABOLIC PANEL
CO2: 28 mEq/L (ref 19–32)
Calcium: 8.8 mg/dL (ref 8.4–10.5)
Creatinine, Ser: 1.1 mg/dL (ref 0.4–1.5)
Glucose, Bld: 78 mg/dL (ref 70–99)

## 2013-10-17 NOTE — Assessment & Plan Note (Signed)
The patient's blood pressure is remaining stable.  He is not having any headaches dizziness or syncope.

## 2013-10-17 NOTE — Patient Instructions (Addendum)
Your physician has recommended that you have a Cardioversion (DCCV). Electrical Cardioversion uses a jolt of electricity to your heart either through paddles or wired patches attached to your chest. This is a controlled, usually prescheduled, procedure. Defibrillation is done under light anesthesia in the hospital, and you usually go home the day of the procedure. This is done to get your heart back into a normal rhythm. You are not awake for the procedure.    YOU ARE SCHEDULED FOR 10/26/13, ARRIVE AT THE NORTH TOWER ENTRANCE AT [facility] AT 9:15 AM. YOU WILL GO TO SHORT STAY Nothing to eat or drink after midnight Take only Amiodarone and Carvedilol morning of procedure You will need someone to drive you and stay during procedure  Your physician recommends that you schedule a follow-up appointment in: 2 week ov/ekg

## 2013-10-17 NOTE — Progress Notes (Signed)
Jeremiah Perkins Date of Birth:  09-13-1961 [address] [place], [place]  [address] [phone]         Fax   [phone]  History of Present Illness: This pleasant 52 year old [place] American gentleman is seen for a scheduled followup office visit.  He was recently hospitalized at [facility] for exacerbation of acute on chronic systolic congestive heart failure complicated by atrial flutter.  He has a history of a nonischemic cardiomyopathy with previous catheterization showing no significant obstructive disease.  His ejection fraction is estimated at 20-25%.  During his recent hospital stay he underwent TEE cardioversion from atrial flutter back to normal sinus rhythm.  At his last office visit he was found to be back in atrial flutter.  He is diuresing on his current outpatient regimen.  He still notes significant dyspnea with minimal exertion.  Marland Kitchen  He does have occasional chest discomfort.  The patient also has a history of diabetes mellitus type 2 which is followed at the internal medicine clinic.  He has a history of obstructive sleep apnea and morbid obesity and diabetic peripheral neuropathy.  He has a past history of hypertension and gout. Several weeks ago he started the outpatient count cardiac rehabilitation program.  On his third session,  the patient was noted to have an irregular pulse and EKG showed that he was back in atrial flutter fibrillation.  He has been on long-term Xarelto fortunately.  He continues on Xarelto.  At his last office visit, and amiodarone was added to his regimen in anticipation of subsequent cardioversion.  Current Outpatient Prescriptions  Medication Sig Dispense Refill  . allopurinol (ZYLOPRIM) 300 MG tablet Take 1 tablet (300 mg total) by mouth daily.  30 tablet  3  . amiodarone (PACERONE) 200 MG tablet Take 1 tablet (200 mg total) by mouth 2 (two) times daily.  60 tablet  1  . atorvastatin (LIPITOR) 40 MG tablet Take 1 tablet (40  mg total) by mouth daily.  30 tablet  0  . Blood Glucose Monitoring Suppl (AGAMATRIX PRESTO PRO METER) DEVI 1 Device by Does not apply route once.  1 Device  0  . carvedilol (COREG) 6.25 MG tablet Take two tablets in the morning and one tablet in the evening      . docusate sodium (COLACE) 100 MG capsule Take 100 mg by mouth daily as needed for constipation. For constipation      . escitalopram (LEXAPRO) 20 MG tablet Take 1 tablet (20 mg total) by mouth daily.  30 tablet  0  . furosemide (LASIX) 80 MG tablet Take 1 tablet (80 mg total) by mouth daily.  30 tablet  3  . gabapentin (NEURONTIN) 600 MG tablet Take 1-2 tablets (600-1,200 mg total) by mouth 2 (two) times daily. Takes 600mg  in the am & 1200mg  in the pm  90 tablet  12  . glucose blood (AGAMATRIX PRESTO TEST) test strip Use as instructed  100 each  12  . HYDROcodone-acetaminophen (NORCO/VICODIN) 5-325 MG per tablet Take 1 tablet by mouth every 6 (six) hours as needed.  60 tablet  0  . Insulin Pen Needle (B-D ULTRAFINE III SHORT PEN) 31G X 8 MM MISC Use to give insulin daily. Dx code: 250.90.  50 each  12  . levalbuterol (XOPENEX HFA) 45 MCG/ACT inhaler Inhale 1-2 puffs into the lungs every 4 (four) hours as needed for wheezing.  1 Inhaler  12  . Liraglutide (VICTOZA) 18 MG/3ML SOPN Inject  1.8 mg into the skin daily.  9 pen  3  . LORazepam (ATIVAN) 0.5 MG tablet Take 1 tablet (0.5 mg total) by mouth at bedtime as needed for anxiety. For anxiety  30 tablet  2  . losartan (COZAAR) 25 MG tablet Take 1 tablet (25 mg total) by mouth daily.  30 tablet  3  . metFORMIN (GLUCOPHAGE) 500 MG tablet Take 2 tablets (1,000 mg total) by mouth 2 (two) times daily with a meal.  240 tablet  12  . Multiple Vitamins-Minerals (MEGA MULTIVITAMIN FOR MEN) TABS Take 1 capsule by mouth every morning.  30 tablet  3  . pantoprazole (PROTONIX) 40 MG tablet Take 40 mg by mouth as needed.      . Rivaroxaban (XARELTO) 20 MG TABS tablet Take 1 tablet (20 mg total) by mouth  daily with supper.  30 tablet  3  . sildenafil (VIAGRA) 100 MG tablet Take 100 mg by mouth daily as needed. For ED       No current facility-administered medications for this visit.    Allergies  Allergen Reactions  . Eggs Or Egg-Derived Products Nausea And Vomiting    Did ok with flu shot, seems to be an issue with the type of preparation of egg product.  . Sulfa Antibiotics Nausea And Vomiting    Patient Active Problem List   Diagnosis Date Noted  . Healthcare maintenance 10/07/2013  . Groin mass 07/31/2013  . Long term (current) use of anticoagulants 07/22/2013  . Atrial flutter 07/15/2013  . Abnormality of gait 02/28/2013  . Worsening headaches 01/11/2013  . Morbid obesity 11/11/2012  . Encounter to establish care with new doctor 11/11/2012  . Diabetes mellitus type 2, controlled, with complications   . Diverticulosis   . Arthritis   . Major depression   . Diabetic peripheral neuropathy   . Anxiety   . Psychosexual dysfunction with inhibited sexual excitement   . OSA (obstructive sleep apnea)   . Dyspnea 10/27/2011  . Hypertension   . Chronic systolic heart failure   . Gout     History  Smoking status  . Former Smoker -- 0.12 packs/day for 10 years  . Types: Cigarettes  Smokeless tobacco  . Never Used    Comment: Smokes 3 cigs per week / previously smoked 1ppd x 1-2 years; "nothing since ~ 2010" ; "chewed tobacco as a kid" (07/26/2013)    History  Alcohol Use  . Yes    Comment: 07/26/2013 "dron't drink q wk; only on big sporting events, birthdays, weddings, etc"    Family History  Problem Relation Age of Onset  . Diabetes Mother   . CAD Mother 63    requiring quadruple bypass  . Hypertension Mother   . Alzheimer's disease Mother   . Stomach cancer Father   . Lung cancer Father     was a smoker  . Gout Father   . CAD Brother 48    requiring quadruple bypass  . Seizures Brother   . Hypertension Brother   . Diabetes Maternal Grandmother   . Alzheimer's  disease Maternal Grandmother   . Breast cancer Paternal Aunt   . Diabetes Maternal Aunt   . Seizures Paternal Uncle     Review of Systems: Constitutional: no fever chills diaphoresis or fatigue or change in weight.  Head and neck: no hearing loss, no epistaxis, no photophobia or visual disturbance. Respiratory: No cough, shortness of breath or wheezing. Cardiovascular: No chest pain peripheral edema, palpitations. Gastrointestinal: No  abdominal distention, no abdominal pain, no change in bowel habits hematochezia or melena. Genitourinary: No dysuria, no frequency, no urgency, no nocturia. Musculoskeletal:No arthralgias, no back pain, no gait disturbance or myalgias. Neurological: No dizziness, no headaches, no numbness, no seizures, no syncope, no weakness, no tremors. Hematologic: No lymphadenopathy, no easy bruising. Psychiatric: No confusion, no hallucinations, no sleep disturbance.    Physical Exam: Filed Vitals:   10/17/13 1154  BP: 115/61  Pulse: 118   the general appearance reveals a large gentleman in no distress.The head and neck exam reveals pupils equal and reactive.  Extraocular movements are full.  There is no scleral icterus.  The mouth and pharynx are normal.  The neck is supple.  The carotids reveal no bruits.  The jugular venous pressure is normal.  The  thyroid is not enlarged.  There is no lymphadenopathy.  The chest is clear to percussion and auscultation.  There are no rales or rhonchi.  Expansion of the chest is symmetrical.  The precordium is quiet.  The first heart sound is normal.  The second heart sound is physiologically split.  There is no murmur gallop rub or click.  There is no abnormal lift or heave.  The abdomen is soft and nontender.  The bowel sounds are normal.  The liver and spleen are not enlarged.  There are no abdominal masses.  There are no abdominal bruits.  Extremities reveal good pedal pulses.  There is trace peripheral edema..  There is no cyanosis  or clubbing.  Strength is normal and symmetrical in all extremities.  There is no lateralizing weakness.  There are no sensory deficits.  The skin is warm and dry.  There is no rash.  EKG shows atrial flutter with ventricular response of 118  There is low voltage QRS.  Since last tracing of 10/03/13, no significant change.   Assessment / Plan: Proceed with plans for elective cardioversion on December 3 at 11 AM.  Orders have been entered computer. Rechecked for followup visit in 2 weeks for office visit and EKG post cardioversion.  On the morning of his cardioversion he will take just his amiodarone and his carvedilol.

## 2013-10-17 NOTE — Assessment & Plan Note (Signed)
The patient has gained 7 pounds since last visit.  He states that he has not been on his careful diet but intends to do better

## 2013-10-17 NOTE — Assessment & Plan Note (Signed)
The patient remains in atrial flutter.  He has been on adequate duration of anticoagulation.  We are checking preoperative labs today.  He will undergo elective direct-current cardioversion on 10/26/13 at 11 AM.

## 2013-10-18 ENCOUNTER — Telehealth: Payer: Self-pay | Admitting: *Deleted

## 2013-10-18 NOTE — Telephone Encounter (Signed)
Advised patient of lab results  

## 2013-10-18 NOTE — Telephone Encounter (Signed)
Message copied by Burnell Blanks on Tue Oct 18, 2013  3:25 PM ------      Message from: Cassell Clement      Created: Mon Oct 17, 2013  8:01 PM       Labs are okay for cardioversion next week. ------

## 2013-10-19 ENCOUNTER — Encounter (HOSPITAL_COMMUNITY): Payer: BC Managed Care – PPO

## 2013-10-21 ENCOUNTER — Encounter (HOSPITAL_COMMUNITY): Payer: BC Managed Care – PPO

## 2013-10-24 ENCOUNTER — Encounter (HOSPITAL_COMMUNITY): Admission: RE | Admit: 2013-10-24 | Payer: BC Managed Care – PPO | Source: Ambulatory Visit

## 2013-10-24 ENCOUNTER — Encounter (HOSPITAL_COMMUNITY): Payer: BC Managed Care – PPO

## 2013-10-25 ENCOUNTER — Encounter (HOSPITAL_COMMUNITY): Payer: Self-pay | Admitting: Pharmacy Technician

## 2013-10-26 ENCOUNTER — Encounter (HOSPITAL_COMMUNITY): Payer: BC Managed Care – PPO

## 2013-10-26 ENCOUNTER — Encounter (HOSPITAL_COMMUNITY)
Admission: RE | Disposition: A | Discharge status: Home / Self Care | Payer: Self-pay | Source: Ambulatory Visit | Attending: Cardiology

## 2013-10-26 ENCOUNTER — Ambulatory Visit (HOSPITAL_COMMUNITY)
Admission: RE | Admit: 2013-10-26 | Discharge: 2013-10-26 | Disposition: A | Discharge status: Home / Self Care | Payer: BC Managed Care – PPO | Source: Ambulatory Visit | Attending: Cardiology | Admitting: Cardiology

## 2013-10-26 ENCOUNTER — Encounter (HOSPITAL_COMMUNITY): Payer: BC Managed Care – PPO | Admitting: Anesthesiology

## 2013-10-26 ENCOUNTER — Ambulatory Visit (HOSPITAL_COMMUNITY): Payer: BC Managed Care – PPO | Admitting: Anesthesiology

## 2013-10-26 ENCOUNTER — Encounter (HOSPITAL_COMMUNITY): Payer: Self-pay | Admitting: *Deleted

## 2013-10-26 DIAGNOSIS — E1149 Type 2 diabetes mellitus with other diabetic neurological complication: Secondary | ICD-10-CM | POA: Insufficient documentation

## 2013-10-26 DIAGNOSIS — Z7901 Long term (current) use of anticoagulants: Secondary | ICD-10-CM | POA: Insufficient documentation

## 2013-10-26 DIAGNOSIS — I4892 Unspecified atrial flutter: Secondary | ICD-10-CM | POA: Insufficient documentation

## 2013-10-26 DIAGNOSIS — I5022 Chronic systolic (congestive) heart failure: Secondary | ICD-10-CM | POA: Insufficient documentation

## 2013-10-26 DIAGNOSIS — I428 Other cardiomyopathies: Secondary | ICD-10-CM | POA: Insufficient documentation

## 2013-10-26 DIAGNOSIS — I509 Heart failure, unspecified: Secondary | ICD-10-CM | POA: Insufficient documentation

## 2013-10-26 DIAGNOSIS — I10 Essential (primary) hypertension: Secondary | ICD-10-CM | POA: Insufficient documentation

## 2013-10-26 DIAGNOSIS — G4733 Obstructive sleep apnea (adult) (pediatric): Secondary | ICD-10-CM | POA: Insufficient documentation

## 2013-10-26 DIAGNOSIS — E1142 Type 2 diabetes mellitus with diabetic polyneuropathy: Secondary | ICD-10-CM | POA: Insufficient documentation

## 2013-10-26 DIAGNOSIS — Z87891 Personal history of nicotine dependence: Secondary | ICD-10-CM | POA: Insufficient documentation

## 2013-10-26 HISTORY — DX: Unspecified asthma, uncomplicated: J45.909

## 2013-10-26 HISTORY — DX: Pneumonia, unspecified organism: J18.9

## 2013-10-26 HISTORY — PX: CARDIOVERSION: SHX1299

## 2013-10-26 HISTORY — DX: Gastro-esophageal reflux disease without esophagitis: K21.9

## 2013-10-26 SURGERY — CARDIOVERSION
Anesthesia: Monitor Anesthesia Care

## 2013-10-26 MED ORDER — SODIUM CHLORIDE 0.9 % IV SOLN
INTRAVENOUS | Status: DC
  Administered 2013-10-26: 500 mL via INTRAVENOUS

## 2013-10-26 MED ORDER — PROPOFOL 10 MG/ML IV BOLUS
INTRAVENOUS | Status: DC | PRN
  Administered 2013-10-26: 60 mg via INTRAVENOUS

## 2013-10-26 MED ORDER — LIDOCAINE HCL (CARDIAC) 20 MG/ML IV SOLN
INTRAVENOUS | Status: DC | PRN
  Administered 2013-10-26: 30 mg via INTRAVENOUS

## 2013-10-26 NOTE — OR Nursing (Signed)
Patient upon questioning missed his Monday 10/25/2013 and possibly Sunday 10/24/2013 dose of Xarelto.  Dr. Orpah Clinton aware.

## 2013-10-26 NOTE — Transfer of Care (Signed)
Immediate Anesthesia Transfer of Care Note  Patient: Jeremiah Perkins  Procedure(s) Performed: Procedure(s): CARDIOVERSION (N/A)  Patient Location: Endoscopy Unit  Anesthesia Type:General  Level of Consciousness: awake, alert , oriented and patient cooperative  Airway & Oxygen Therapy: Patient Spontanous Breathing and Patient connected to nasal cannula oxygen  Post-op Assessment: Report given to PACU RN, Post -op Vital signs reviewed and stable, Patient moving all extremities and Patient moving all extremities X 4  Post vital signs: Reviewed and stable  Complications: No apparent anesthesia complications

## 2013-10-26 NOTE — H&P (Signed)
Jeremiah Perkins is an 52 y.o. male.   Chief Complaint: Atrial flutter HPI:   See recent office note: This pleasant 52 year old [place] American gentleman is seen for a scheduled followup office visit. He was recently hospitalized at [facility] for exacerbation of acute on chronic systolic congestive heart failure complicated by atrial flutter. He has a history of a nonischemic cardiomyopathy with previous catheterization showing no significant obstructive disease. His ejection fraction is estimated at 20-25%. During his recent hospital stay he underwent TEE cardioversion from atrial flutter back to normal sinus rhythm. At his last office visit he was found to be back in atrial flutter. He is diuresing on his current outpatient regimen. He still notes significant dyspnea with minimal exertion. Marland Kitchen He does have occasional chest discomfort. The patient also has a history of diabetes mellitus type 2 which is followed at the internal medicine clinic. He has a history of obstructive sleep apnea and morbid obesity and diabetic peripheral neuropathy. He has a past history of hypertension and gout.  Several weeks ago he started the outpatient count cardiac rehabilitation program. On his third session, the patient was noted to have an irregular pulse and EKG showed that he was back in atrial flutter fibrillation. He has been on long-term Xarelto fortunately. He continues on Xarelto. At his last office visit, and amiodarone was added to his regimen in anticipation of subsequent cardioversion.  Past Medical History  Diagnosis Date  . Hypertension   . Congestive heart failure     No echo data available. Myocardial perfusion 2005 with EF 61%, presumed diastolic HF   . Diverticulosis     with history of diverticulitis in 10/2011  . Gout   . Major depression   . Anxiety   . Psychosexual dysfunction with inhibited sexual excitement   . Hyperlipidemia   . Atrial flutter   . Morbid obesity   . Diabetic  peripheral neuropathy   . Type II diabetes mellitus dx'd 1991    previously on insulin in 2000 for 3-4 years (but was stopped because adamantly did not want to be on insulin)  . Chronic bronchitis     "get it q yr" (07/26/2013)  . Shortness of breath     "all the time" (07/26/2013)  . OSA (obstructive sleep apnea) 1998    previously on CPAP - unable to tolerate; "going to purchase one next week" (07/26/2013)  . Arthritis     involving knees, ankles, back, elbows  . Chronic back pain     Past Surgical History  Procedure Laterality Date  . Ankle reconstruction Right 1990's    2/2 trauma sustained after fall  . 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 Cyst removal leg Left 1960's    back of left leg  . Cardiac catheterization  1990's  . Tee without cardioversion N/A 07/28/2013    Procedure: TRANSESOPHAGEAL ECHOCARDIOGRAM (TEE);  Surgeon: Laurey Morale, MD;  Location: [facility] ENDOSCOPY;  Service: Cardiovascular;  Laterality: N/A;  . Cardioversion N/A 07/28/2013    Procedure: CARDIOVERSION;  Surgeon: Laurey Morale, MD;  Location: [facility] ENDOSCOPY;  Service: Cardiovascular;  Laterality: N/A;    Family History  Problem Relation Age of Onset  . Diabetes Mother   . CAD Mother 98    requiring quadruple bypass  . Hypertension Mother   . Alzheimer's disease Mother   . Stomach cancer Father   . Lung cancer Father     was a smoker  .  Gout Father   . CAD Brother 48    requiring quadruple bypass  . Seizures Brother   . Hypertension Brother   . Diabetes Maternal Grandmother   . Alzheimer's disease Maternal Grandmother   . Breast cancer Paternal Aunt   . Diabetes Maternal Aunt   . Seizures Paternal Uncle    Social History:  reports that he has quit smoking. His smoking use included Cigarettes. He has a 1.2 pack-year smoking history. He has never used smokeless tobacco. He reports that he drinks alcohol. He reports that he uses illicit drugs (Marijuana).  Allergies:  Allergies  Allergen  Reactions  . Eggs Or Egg-Derived Products Nausea And Vomiting    Did ok with flu shot, seems to be an issue with the type of preparation of egg product.  . Sulfa Antibiotics Nausea And Vomiting    Medications Prior to Admission  Medication Sig Dispense Refill  . allopurinol (ZYLOPRIM) 300 MG tablet Take 1 tablet (300 mg total) by mouth daily.  30 tablet  3  . amiodarone (PACERONE) 200 MG tablet Take 1 tablet (200 mg total) by mouth 2 (two) times daily.  60 tablet  1  . atorvastatin (LIPITOR) 40 MG tablet Take 1 tablet (40 mg total) by mouth daily.  30 tablet  0  . Blood Glucose Monitoring Suppl (AGAMATRIX PRESTO PRO METER) DEVI 1 Device by Does not apply route once.  1 Device  0  . carvedilol (COREG) 6.25 MG tablet Take 6.25-12.5 mg by mouth 2 (two) times daily with a meal. Take two tablets in the morning and one tablet in the evening      . docusate sodium (COLACE) 100 MG capsule Take 100 mg by mouth daily as needed for mild constipation. For constipation      . escitalopram (LEXAPRO) 20 MG tablet Take 1 tablet (20 mg total) by mouth daily.  30 tablet  0  . furosemide (LASIX) 80 MG tablet Take 1 tablet (80 mg total) by mouth daily.  30 tablet  3  . gabapentin (NEURONTIN) 600 MG tablet Take 1-2 tablets (600-1,200 mg total) by mouth 2 (two) times daily. Takes 600mg  in the am & 1200mg  in the pm  90 tablet  12  . glucose blood (AGAMATRIX PRESTO TEST) test strip Use as instructed  100 each  12  . HYDROcodone-acetaminophen (NORCO/VICODIN) 5-325 MG per tablet Take 1 tablet by mouth every 6 (six) hours as needed for moderate pain.      . Insulin Pen Needle (B-D ULTRAFINE III SHORT PEN) 31G X 8 MM MISC Use to give insulin daily. Dx code: 250.90.  50 each  12  . levalbuterol (XOPENEX HFA) 45 MCG/ACT inhaler Inhale 1-2 puffs into the lungs every 4 (four) hours as needed for wheezing.  1 Inhaler  12  . Liraglutide (VICTOZA) 18 MG/3ML SOPN Inject 1.8 mg into the skin daily.  9 pen  3  . LORazepam (ATIVAN)  0.5 MG tablet Take 1 tablet (0.5 mg total) by mouth at bedtime as needed for anxiety. For anxiety  30 tablet  2  . losartan (COZAAR) 25 MG tablet Take 1 tablet (25 mg total) by mouth daily.  30 tablet  3  . metFORMIN (GLUCOPHAGE) 500 MG tablet Take 2 tablets (1,000 mg total) by mouth 2 (two) times daily with a meal.  240 tablet  12  . Multiple Vitamins-Minerals (MEGA MULTIVITAMIN FOR MEN) TABS Take 1 capsule by mouth every morning.  30 tablet  3  . pantoprazole (  PROTONIX) 40 MG tablet Take 40 mg by mouth daily as needed (for reflux).       . Rivaroxaban (XARELTO) 20 MG TABS tablet Take 1 tablet (20 mg total) by mouth daily with supper.  30 tablet  3  . sildenafil (VIAGRA) 100 MG tablet Take 100 mg by mouth daily as needed for erectile dysfunction. For ED        No results found for this or any previous visit (from the past 48 hour(s)). No results found.  ROS Otherwise noncontributory  Blood pressure 122/72, temperature 97.7 F (36.5 C), temperature source Oral, SpO2 99.00%. Physical Exam  the general appearance reveals a large gentleman in no distress.The head and neck exam reveals pupils equal and reactive. Extraocular movements are full. There is no scleral icterus. The mouth and pharynx are normal. The neck is supple. The carotids reveal no bruits. The jugular venous pressure is normal. The thyroid is not enlarged. There is no lymphadenopathy. The chest is clear to percussion and auscultation. There are no rales or rhonchi. Expansion of the chest is symmetrical. The precordium is quiet. The first heart sound is normal. The second heart sound is physiologically split. There is no murmur gallop rub or click. There is no abnormal lift or heave. The abdomen is soft and nontender. The bowel sounds are normal. The liver and spleen are not enlarged. There are no abdominal masses. There are no abdominal bruits. Extremities reveal good pedal pulses. There is trace peripheral edema.. There is no cyanosis  or clubbing. Strength is normal and symmetrical in all extremities. There is no lateralizing weakness. There are no sensory deficits. The skin is warm and dry. There is no rash.  Assessment/Plan Atrial flutter, on adequate dose of xarelto Chronic systolic HF EF 30-35% Plan: DCCV today.  Cassell Clement 10/26/2013, 9:44 AM

## 2013-10-26 NOTE — Anesthesia Preprocedure Evaluation (Addendum)
Anesthesia Evaluation  Patient identified by MRN, date of birth, ID band Patient awake    Reviewed: Allergy & Precautions, H&P , NPO status , Patient's Chart, lab work & pertinent test results, reviewed documented beta blocker date and time   History of Anesthesia Complications Negative for: history of anesthetic complications  Airway Mallampati: I TM Distance: >3 FB Neck ROM: Full    Dental  (+) Poor Dentition, Missing, Chipped and Dental Advisory Given   Pulmonary asthma , sleep apnea , COPD COPD inhaler, former smoker,  breath sounds clear to auscultation  Pulmonary exam normal       Cardiovascular hypertension, Pt. on medications and Pt. on home beta blockers +CHF + dysrhythmias (xarelto) Atrial Fibrillation Rhythm:Irregular Rate:Normal  9/14 ECHO: EF 25-30%, mild MR   Neuro/Psych    GI/Hepatic Neg liver ROS, GERD-  Medicated and Controlled,  Endo/Other  diabetes, Insulin Dependent and Oral Hypoglycemic AgentsMorbid obesity  Renal/GU negative Renal ROS     Musculoskeletal   Abdominal (+) + obese,   Peds  Hematology   Anesthesia Other Findings   Reproductive/Obstetrics                         Anesthesia Physical Anesthesia Plan  ASA: III  Anesthesia Plan: General   Post-op Pain Management:    Induction: Intravenous  Airway Management Planned: Mask  Additional Equipment:   Intra-op Plan:   Post-operative Plan:   Informed Consent: I have reviewed the patients History and Physical, chart, labs and discussed the procedure including the risks, benefits and alternatives for the proposed anesthesia with the patient or authorized representative who has indicated his/her understanding and acceptance.   Dental advisory given  Plan Discussed with: Surgeon and CRNA  Anesthesia Plan Comments: (Plan routine monitors, GA for cardioversion)        Anesthesia Quick Evaluation

## 2013-10-26 NOTE — Anesthesia Postprocedure Evaluation (Signed)
  Anesthesia Post-op Note  Patient: Jeremiah Perkins  Procedure(s) Performed: Procedure(s): CARDIOVERSION (N/A)  Patient Location: Endoscopy Unit  Anesthesia Type:General  Level of Consciousness: awake, alert , oriented and patient cooperative  Airway and Oxygen Therapy: Patient Spontanous Breathing  Post-op Pain: none  Post-op Assessment: Post-op Vital signs reviewed, Patient's Cardiovascular Status Stable, Respiratory Function Stable and Patent Airway  Post-op Vital Signs: Reviewed and stable  Complications: No apparent anesthesia complications

## 2013-10-26 NOTE — CV Procedure (Signed)
Electrical Cardioversion Procedure Note Jeremiah Perkins [id number] 08/08/1961  Procedure: Electrical Cardioversion Indications:  Atrial Flutter  Procedure Details Consent: Risks of procedure as well as the alternatives and risks of each were explained to the (patient/caregiver).  Consent for procedure obtained. Time Out: Verified patient identification, verified procedure, site/side was marked, verified correct patient position, special equipment/implants available, medications/allergies/relevent history reviewed, required imaging and test results available.  Performed  Patient placed on cardiac monitor, pulse oximetry, supplemental oxygen as necessary.  Sedation given: propofol Pacer pads placed anterior and posterior chest.  Cardioverted 1 time(s).  Cardioverted at 75J.  Evaluation Findings: Post procedure EKG shows: NSR Complications: None Patient did tolerate procedure well.   Cassell Clement 10/26/2013, 11:15 AM

## 2013-10-27 ENCOUNTER — Encounter (HOSPITAL_COMMUNITY): Payer: Self-pay | Admitting: Cardiology

## 2013-10-28 ENCOUNTER — Telehealth (HOSPITAL_COMMUNITY): Payer: Self-pay | Admitting: *Deleted

## 2013-10-28 ENCOUNTER — Encounter (HOSPITAL_COMMUNITY): Payer: BC Managed Care – PPO

## 2013-10-28 ENCOUNTER — Encounter (HOSPITAL_COMMUNITY): Admission: RE | Admit: 2013-10-28 | Payer: BC Managed Care – PPO | Source: Ambulatory Visit

## 2013-10-28 NOTE — Telephone Encounter (Signed)
Pt informed that he is cleared to return back to exercise on 10/31/13.  Pt verbalized understanding and is in agreement.

## 2013-10-28 NOTE — Telephone Encounter (Signed)
Message copied by Chelsea Aus on Fri Oct 28, 2013 11:47 AM ------      Message from: Cassell Clement      Created: Thu Oct 27, 2013  2:30 PM      Regarding: RE: Rip Harbour to return to rehab       The patient may return to cardiac rehabilitation on Monday, December 8      ----- Message -----         From: Arna Medici, RN         Sent: 10/27/2013   1:50 PM           To: Cassell Clement, MD      Subject: Rip Harbour to return to rehab                                           Pt on hold for exercise pending cardioversion on 12/3.  Noted in medical record successful cardioversion.      When may pt return to exercise?      Any restrictions?            Thanks      Carlette       ------

## 2013-10-31 ENCOUNTER — Encounter (HOSPITAL_COMMUNITY): Payer: BC Managed Care – PPO

## 2013-10-31 ENCOUNTER — Encounter (HOSPITAL_COMMUNITY)
Admission: RE | Admit: 2013-10-31 | Discharge: 2013-10-31 | Disposition: A | Discharge status: Home / Self Care | Payer: BC Managed Care – PPO | Source: Ambulatory Visit | Attending: Cardiology | Admitting: Cardiology

## 2013-10-31 DIAGNOSIS — Z8679 Personal history of other diseases of the circulatory system: Secondary | ICD-10-CM | POA: Insufficient documentation

## 2013-10-31 DIAGNOSIS — Z7982 Long term (current) use of aspirin: Secondary | ICD-10-CM | POA: Insufficient documentation

## 2013-10-31 DIAGNOSIS — F17200 Nicotine dependence, unspecified, uncomplicated: Secondary | ICD-10-CM | POA: Insufficient documentation

## 2013-10-31 DIAGNOSIS — I509 Heart failure, unspecified: Secondary | ICD-10-CM | POA: Insufficient documentation

## 2013-10-31 DIAGNOSIS — Z79899 Other long term (current) drug therapy: Secondary | ICD-10-CM | POA: Insufficient documentation

## 2013-10-31 DIAGNOSIS — I5032 Chronic diastolic (congestive) heart failure: Secondary | ICD-10-CM | POA: Insufficient documentation

## 2013-10-31 DIAGNOSIS — Z8249 Family history of ischemic heart disease and other diseases of the circulatory system: Secondary | ICD-10-CM | POA: Insufficient documentation

## 2013-10-31 DIAGNOSIS — Z5189 Encounter for other specified aftercare: Secondary | ICD-10-CM | POA: Insufficient documentation

## 2013-10-31 DIAGNOSIS — G4733 Obstructive sleep apnea (adult) (pediatric): Secondary | ICD-10-CM | POA: Insufficient documentation

## 2013-10-31 DIAGNOSIS — I4892 Unspecified atrial flutter: Secondary | ICD-10-CM | POA: Insufficient documentation

## 2013-10-31 DIAGNOSIS — I10 Essential (primary) hypertension: Secondary | ICD-10-CM | POA: Insufficient documentation

## 2013-10-31 LAB — GLUCOSE, CAPILLARY: Glucose-Capillary: 114 mg/dL — ABNORMAL HIGH (ref 70–99)

## 2013-10-31 NOTE — Progress Notes (Signed)
Pt returned to rehab after cardioversion on last Wednesday.  Pt weight elevated 5 kg.  Pt believes weight increase is from eating differently over the holidays.  Pt does not feel any more short of breath than usual. Pt tolerated exercise but did fatigue easily and asked to leave early due to chest soreness from the cardioversion.  Monitor showed SR.  Continue to monitor.

## 2013-11-02 ENCOUNTER — Telehealth: Payer: Self-pay | Admitting: *Deleted

## 2013-11-02 ENCOUNTER — Encounter: Payer: Self-pay | Admitting: Cardiology

## 2013-11-02 ENCOUNTER — Encounter (HOSPITAL_COMMUNITY)
Admission: RE | Admit: 2013-11-02 | Discharge: 2013-11-02 | Disposition: A | Discharge status: Home / Self Care | Payer: BC Managed Care – PPO | Source: Ambulatory Visit | Attending: Cardiology | Admitting: Cardiology

## 2013-11-02 ENCOUNTER — Encounter (HOSPITAL_COMMUNITY): Payer: BC Managed Care – PPO

## 2013-11-02 ENCOUNTER — Ambulatory Visit (INDEPENDENT_AMBULATORY_CARE_PROVIDER_SITE_OTHER): Payer: BC Managed Care – PPO | Admitting: Cardiology

## 2013-11-02 VITALS — BP 118/66 | HR 62 | Ht 67.0 in | Wt 258.0 lb

## 2013-11-02 DIAGNOSIS — Z7901 Long term (current) use of anticoagulants: Secondary | ICD-10-CM

## 2013-11-02 DIAGNOSIS — I4892 Unspecified atrial flutter: Secondary | ICD-10-CM

## 2013-11-02 DIAGNOSIS — I5022 Chronic systolic (congestive) heart failure: Secondary | ICD-10-CM

## 2013-11-02 DIAGNOSIS — I428 Other cardiomyopathies: Secondary | ICD-10-CM

## 2013-11-02 DIAGNOSIS — I10 Essential (primary) hypertension: Secondary | ICD-10-CM

## 2013-11-02 LAB — GLUCOSE, CAPILLARY: Glucose-Capillary: 130 mg/dL — ABNORMAL HIGH (ref 70–99)

## 2013-11-02 NOTE — Patient Instructions (Signed)
DECREASE YOUR AMIODARONE TO 200 MG TO DAILY  Your physician recommends that you schedule a follow-up appointment in: 2 MONTH OV/EKG

## 2013-11-02 NOTE — Assessment & Plan Note (Signed)
His weight which was entered on his previous visit at 236 was not correct.  He thinks that it may have been 256 which is similar to what he weighs today.  He is making an effort to watch his diet and to lose weight

## 2013-11-02 NOTE — Assessment & Plan Note (Signed)
Following successful cardioversion on 10/26/13 the patient is remaining in normal sinus rhythm.  We will reduce his amiodarone to just once a day.

## 2013-11-02 NOTE — Telephone Encounter (Signed)
Order placed and will be shipped 12/16, order number [id number] Will call patient when received  Patient id # [id number]  Reorder number [phone]

## 2013-11-02 NOTE — Assessment & Plan Note (Signed)
His breathing has improved.  He is not having any acute dyspnea.  He is not having any ankle edema.

## 2013-11-02 NOTE — Assessment & Plan Note (Signed)
Patient is not having any adverse effects from being on long-term Xarelto.

## 2013-11-02 NOTE — Progress Notes (Signed)
Jeremiah Perkins Date of Birth:  04-Dec-1960 [address] [place], [place]  [address] [phone]         Fax   [phone]  History of Present Illness: This pleasant 52 year old African American gentleman is seen for a post hospital followup office visit.  The patient underwent elective outpatient cardioversion on 10/26/13.  He has remained in normal sinus rhythm. He was recently hospitalized at [facility] for exacerbation of acute on chronic systolic congestive heart failure complicated by atrial flutter.  He has a history of a nonischemic cardiomyopathy with previous catheterization showing no significant obstructive disease.  His ejection fraction is estimated at 20-25%.  During his recent hospital stay he underwent TEE cardioversion from atrial flutter back to normal sinus rhythm.  At his last office visit he was found to be back in atrial flutter.  He is diuresing on his current outpatient regimen.  He still notes significant dyspnea with minimal exertion.  Marland Kitchen  He does have occasional chest discomfort.  The patient also has a history of diabetes mellitus type 2 which is followed at the internal medicine clinic.  He has a history of obstructive sleep apnea and morbid obesity and diabetic peripheral neuropathy.  He has a past history of hypertension and gout. The patient has started back in the cardiac rehabilitation program.  So far he is tolerating exercise without adverse effect.  Current Outpatient Prescriptions  Medication Sig Dispense Refill  . allopurinol (ZYLOPRIM) 300 MG tablet Take 1 tablet (300 mg total) by mouth daily.  30 tablet  3  . amiodarone (PACERONE) 200 MG tablet Take 200 mg by mouth daily.      Marland Kitchen atorvastatin (LIPITOR) 40 MG tablet Take 1 tablet (40 mg total) by mouth daily.  30 tablet  0  . Blood Glucose Monitoring Suppl (AGAMATRIX PRESTO PRO METER) DEVI 1 Device by Does not apply route once.  1 Device  0  . carvedilol (COREG) 6.25 MG tablet Take  6.25-12.5 mg by mouth 2 (two) times daily with a meal. Take two tablets in the morning and one tablet in the evening      . docusate sodium (COLACE) 100 MG capsule Take 100 mg by mouth daily as needed for mild constipation. For constipation      . escitalopram (LEXAPRO) 20 MG tablet Take 1 tablet (20 mg total) by mouth daily.  30 tablet  0  . furosemide (LASIX) 80 MG tablet Take 1 tablet (80 mg total) by mouth daily.  30 tablet  3  . gabapentin (NEURONTIN) 600 MG tablet Take 1-2 tablets (600-1,200 mg total) by mouth 2 (two) times daily. Takes 600mg  in the am & 1200mg  in the pm  90 tablet  12  . glucose blood (AGAMATRIX PRESTO TEST) test strip Use as instructed  100 each  12  . HYDROcodone-acetaminophen (NORCO/VICODIN) 5-325 MG per tablet Take 1 tablet by mouth every 6 (six) hours as needed for moderate pain.      . Insulin Pen Needle (B-D ULTRAFINE III SHORT PEN) 31G X 8 MM MISC Use to give insulin daily. Dx code: 250.90.  50 each  12  . levalbuterol (XOPENEX HFA) 45 MCG/ACT inhaler Inhale 1-2 puffs into the lungs every 4 (four) hours as needed for wheezing.  1 Inhaler  12  . Liraglutide (VICTOZA) 18 MG/3ML SOPN Inject 1.8 mg into the skin daily.  9 pen  3  . LORazepam (ATIVAN) 0.5 MG tablet Take 1 tablet (0.5  mg total) by mouth at bedtime as needed for anxiety. For anxiety  30 tablet  2  . losartan (COZAAR) 25 MG tablet Take 1 tablet (25 mg total) by mouth daily.  30 tablet  3  . metFORMIN (GLUCOPHAGE) 500 MG tablet Take 2 tablets (1,000 mg total) by mouth 2 (two) times daily with a meal.  240 tablet  12  . Multiple Vitamins-Minerals (MEGA MULTIVITAMIN FOR MEN) TABS Take 1 capsule by mouth every morning.  30 tablet  3  . pantoprazole (PROTONIX) 40 MG tablet Take 40 mg by mouth daily as needed (for reflux).       . Rivaroxaban (XARELTO) 20 MG TABS tablet Take 1 tablet (20 mg total) by mouth daily with supper.  30 tablet  3  . sildenafil (VIAGRA) 100 MG tablet Take 100 mg by mouth daily as needed for  erectile dysfunction. For ED       No current facility-administered medications for this visit.    Allergies  Allergen Reactions  . Eggs Or Egg-Derived Products Nausea And Vomiting    Did ok with flu shot, seems to be an issue with the type of preparation of egg product.  . Sulfa Antibiotics Nausea And Vomiting    Patient Active Problem List   Diagnosis Date Noted  . Healthcare maintenance 10/07/2013  . Groin mass 07/31/2013  . Long term (current) use of anticoagulants 07/22/2013  . Atrial flutter 07/15/2013  . Abnormality of gait 02/28/2013  . Worsening headaches 01/11/2013  . Morbid obesity 11/11/2012  . Encounter to establish care with new doctor 11/11/2012  . Diabetes mellitus type 2, controlled, with complications   . Diverticulosis   . Arthritis   . Major depression   . Diabetic peripheral neuropathy   . Anxiety   . Psychosexual dysfunction with inhibited sexual excitement   . OSA (obstructive sleep apnea)   . Dyspnea 10/27/2011  . Hypertension   . Chronic systolic heart failure   . Gout     History  Smoking status  . Former Smoker -- 0.12 packs/day for 10 years  . Types: Cigarettes  Smokeless tobacco  . Never Used    Comment: Smokes 3 cigs per week / previously smoked 1ppd x 1-2 years; "nothing since ~ 2010" ; "chewed tobacco as a kid" (07/26/2013)    History  Alcohol Use  . Yes    Comment: 07/26/2013 "dron't drink q wk; only on big sporting events, birthdays, weddings, etc"    Family History  Problem Relation Age of Onset  . Diabetes Mother   . CAD Mother 73    requiring quadruple bypass  . Hypertension Mother   . Alzheimer's disease Mother   . Stomach cancer Father   . Lung cancer Father     was a smoker  . Gout Father   . CAD Brother 48    requiring quadruple bypass  . Seizures Brother   . Hypertension Brother   . Diabetes Maternal Grandmother   . Alzheimer's disease Maternal Grandmother   . Breast cancer Paternal Aunt   . Diabetes Maternal  Aunt   . Seizures Paternal Uncle     Review of Systems: Constitutional: no fever chills diaphoresis or fatigue or change in weight.  Head and neck: no hearing loss, no epistaxis, no photophobia or visual disturbance. Respiratory: No cough, shortness of breath or wheezing. Cardiovascular: No chest pain peripheral edema, palpitations. Gastrointestinal: No abdominal distention, no abdominal pain, no change in bowel habits hematochezia or melena. Genitourinary: No  dysuria, no frequency, no urgency, no nocturia. Musculoskeletal:No arthralgias, no back pain, no gait disturbance or myalgias. Neurological: No dizziness, no headaches, no numbness, no seizures, no syncope, no weakness, no tremors. Hematologic: No lymphadenopathy, no easy bruising. Psychiatric: No confusion, no hallucinations, no sleep disturbance.    Physical Exam: Filed Vitals:   11/02/13 1549  BP: 118/66  Pulse: 62   the general appearance reveals a large gentleman in no distress.The head and neck exam reveals pupils equal and reactive.  Extraocular movements are full.  There is no scleral icterus.  The mouth and pharynx are normal.  The neck is supple.  The carotids reveal no bruits.  The jugular venous pressure is normal.  The  thyroid is not enlarged.  There is no lymphadenopathy.  The chest is clear to percussion and auscultation.  There are no rales or rhonchi.  Expansion of the chest is symmetrical.  The precordium is quiet.  The first heart sound is normal.  The second heart sound is physiologically split.  There is no murmur gallop rub or click.  There is no abnormal lift or heave.  The abdomen is soft and nontender.  The bowel sounds are normal.  The liver and spleen are not enlarged.  There are no abdominal masses.  There are no abdominal bruits.  Extremities reveal good pedal pulses.  There is trace peripheral edema..  There is no cyanosis or clubbing.  Strength is normal and symmetrical in all extremities.  There is no  lateralizing weakness.  There are no sensory deficits.  The skin is warm and dry.  There is no rash.  EKG today shows normal sinus rhythm with normal PR interval and prolonged QTC at 527   Assessment / Plan: We will decrease his amiodarone to just once a day.  Keep her other medicines the same and be rechecked in 2 months for office visit and EKG

## 2013-11-02 NOTE — Progress Notes (Signed)
Reviewed home exercise with pt today.  Pt plans to walk at home and to return to [facility] for exercise.  Discussed getting used to being back here first before adding in days at the gym for about a month.  Reviewed THR, pulse, RPE, sign and symptoms, and when to call 911 or MD.  Pt voiced understanding. Fabio Pierce, MA, ACSM RCEP

## 2013-11-04 ENCOUNTER — Encounter (HOSPITAL_COMMUNITY)
Admission: RE | Admit: 2013-11-04 | Discharge: 2013-11-04 | Disposition: A | Discharge status: Home / Self Care | Payer: BC Managed Care – PPO | Source: Ambulatory Visit | Attending: Cardiology | Admitting: Cardiology

## 2013-11-04 ENCOUNTER — Encounter (HOSPITAL_COMMUNITY): Payer: BC Managed Care – PPO

## 2013-11-04 ENCOUNTER — Ambulatory Visit (INDEPENDENT_AMBULATORY_CARE_PROVIDER_SITE_OTHER): Payer: BC Managed Care – PPO | Admitting: Internal Medicine

## 2013-11-04 ENCOUNTER — Encounter: Payer: Self-pay | Admitting: Internal Medicine

## 2013-11-04 VITALS — BP 93/59 | HR 56 | Temp 96.7°F | Ht 67.0 in | Wt 262.1 lb

## 2013-11-04 DIAGNOSIS — M25569 Pain in unspecified knee: Secondary | ICD-10-CM

## 2013-11-04 DIAGNOSIS — F332 Major depressive disorder, recurrent severe without psychotic features: Secondary | ICD-10-CM

## 2013-11-04 DIAGNOSIS — I509 Heart failure, unspecified: Secondary | ICD-10-CM

## 2013-11-04 DIAGNOSIS — I5022 Chronic systolic (congestive) heart failure: Secondary | ICD-10-CM

## 2013-11-04 DIAGNOSIS — I4892 Unspecified atrial flutter: Secondary | ICD-10-CM

## 2013-11-04 DIAGNOSIS — E118 Type 2 diabetes mellitus with unspecified complications: Secondary | ICD-10-CM

## 2013-11-04 DIAGNOSIS — M25561 Pain in right knee: Secondary | ICD-10-CM

## 2013-11-04 DIAGNOSIS — I10 Essential (primary) hypertension: Secondary | ICD-10-CM

## 2013-11-04 DIAGNOSIS — F329 Major depressive disorder, single episode, unspecified: Secondary | ICD-10-CM

## 2013-11-04 MED ORDER — BUPROPION HCL ER (XL) 150 MG PO TB24: 150.0000 mg | ORAL_TABLET | ORAL | Status: DC

## 2013-11-04 MED ORDER — HYDROCODONE-ACETAMINOPHEN 5-325 MG PO TABS: 1.0000 | ORAL_TABLET | Freq: Four times a day (QID) | ORAL | Status: DC | PRN

## 2013-11-04 NOTE — Assessment & Plan Note (Signed)
Patient has self discontinued Lexapro and not returned to his recently established a psychiatrist and therefore would like [facility] to help manage him with his depression. Given that he was a previous smoker and continues to have increasing cigarette cravings Will start patient on Wellbutrin. -Wellbutrin 150 mg Will reevaluate in 4 months to up titrate

## 2013-11-04 NOTE — Assessment & Plan Note (Signed)
pt is asymptomatic and recently taking optiate medication that maybe influencing BP . Will continue current medications BP Readings from Last 3 Encounters:  11/04/13 93/59  11/02/13 118/66  10/26/13 110/69

## 2013-11-04 NOTE — Assessment & Plan Note (Signed)
pt is participating in cardiac rehab with improvement. Having some weight changes 2/2 increased dietary indiscretions over the holidays. Pt has not had increased LE edema, SOB, or CP.  -cont current medications and rehab Wt Readings from Last 3 Encounters:  11/04/13 262 lb 1.6 oz (118.888 kg)  11/02/13 258 lb (117.028 kg)  10/17/13 236 lb (107.049 kg)

## 2013-11-04 NOTE — Assessment & Plan Note (Signed)
pt hgbA1c 5.6 and doing well. uptodate on all his maintenance given diabetic foot exam, retinal scan done on last visit of 10/07/13 and these results were reviewed personally with the patient today. Patient has not noticed any hypoglycemic symptoms. -Education materials regarding hypoglycemia -Will titrate down metformin to 1000 daily (500 mg twice a day) and continue that dose -We'll reevaluate in 4-6 months and may be able to titrate off metformin totally

## 2013-11-04 NOTE — Patient Instructions (Signed)
It was a pleasure to see you today.   In terms of your Diabetes you are doing well. We reviewed your eye pictures and no changes can recheck in 12 months. And I think you are doing well so will continue the victoza and metformin. But we will change your metformin to 1000mg  for the day. Take metformin 500mg  Am and in the PM.

## 2013-11-04 NOTE — Progress Notes (Signed)
   Subjective:    Patient ID: Jeremiah Perkins, male    DOB: 10-24-1961, 52 y.o.   MRN: [id number]  HPI Jeremiah Perkins is a 52 year old man with complicated past medical history most significant for atrial flutter status post 2 DC TV most recent in 12/3 and continues on Zaroxolyn and amiodarone titration, congestive heart failure, diabetes, hypertension who presents for a followup and evaluation of his depression.  Patient states that he recently discontinued Lexapro for the past 4-6 weeks giving that he was dissatisfied with it and has also sever ties with his most recent psychiatrist. He has had marked success with Wellbutrin in the past along with helping him stop smoking cravings and would like to repeat continue that at this point.  Patient denies any ongoing chest pain, shortness of breath, headaches, palpitations, increased lower extreme edema, significant weight gain although having recent given dietary indiscretions over the holidays, vision changes, polydipsia or polyuria. He has not had any other subjective fevers/chills, nausea/vomiting/diarrhea.  The patients social, medical, surgical histories, along with medications were reviewed with the patient.   Review of Systems     Objective:   Physical Exam Filed Vitals:   11/04/13 1339  BP: 93/59  Pulse: 56  Temp:     General: sitting in chair, NAD HEENT: PERRL, EOMI, no scleral icterus Cardiac: RRR, no rubs, murmurs or gallops Pulm: clear to auscultation bilaterally, moving normal volumes of air Abd: soft, nontender, nondistended, BS present Ext: warm and well perfused, no pedal edema Neuro: alert and oriented X3, cranial nerves II-XII grossly intact       Assessment & Plan:  1. CHF: pt is participating in cardiac rehab with improvement. Having some weight changes 2/2 increased dietary indiscretions over the holidays. Pt has not had increased LE edema, SOB, or CP.  -cont current medications and rehab Wt Readings from Last  3 Encounters:  11/04/13 262 lb 1.6 oz (118.888 kg)  11/02/13 258 lb (117.028 kg)  10/17/13 236 lb (107.049 kg)    2. HTN: pt is asymptomatic and recently taking optiate medication that maybe influencing BP . Will continue current medications BP Readings from Last 3 Encounters:  11/04/13 93/59  11/02/13 118/66  10/26/13 110/69    3. DM: pt hgbA1c 5.6 and doing well. uptodate on all his maintenance given diabetic foot exam, retinal scan done on last visit of 10/07/13 and these results were reviewed personally with the patient today. Patient has not noticed any hypoglycemic symptoms. -Education materials regarding hypoglycemia -Will titrate down metformin to 1000 daily (500 mg twice a day) and continue that dose -We'll reevaluate in 4-6 months and may be able to titrate off metformin totally   4. Aflutter: pt was cardioverted on 12/3 by Dr. Danielle Rankin and is in NSR today and f/u with cardiology his office and is now being titrated off amiodarone. Pt continues on xarelto and no complications or frank bleeding.  -cont current management  5. Depression: Patient has self discontinued Lexapro and not returned to his recently established a psychiatrist and therefore would like [facility] to help manage him with his depression. Given that he was a previous smoker and continues to have increasing cigarette cravings Will start patient on Wellbutrin. -Wellbutrin 150 mg Will reevaluate in 4 months to up titrate  6. Knee pain: pt has chronic knee pain and needed a refill on his pain medications  Pt was discussed with Dr. Aundria Rud

## 2013-11-04 NOTE — Assessment & Plan Note (Signed)
pt was cardioverted on 12/3 by Dr. Danielle Rankin and is in NSR today and f/u with cardiology his office and is now being titrated off amiodarone. Pt continues on xarelto and no complications or frank bleeding.  -cont current management

## 2013-11-07 ENCOUNTER — Encounter (HOSPITAL_COMMUNITY): Payer: BC Managed Care – PPO

## 2013-11-07 ENCOUNTER — Encounter (HOSPITAL_COMMUNITY)
Admission: RE | Admit: 2013-11-07 | Discharge: 2013-11-07 | Disposition: A | Discharge status: Home / Self Care | Payer: BC Managed Care – PPO | Source: Ambulatory Visit | Attending: Cardiology | Admitting: Cardiology

## 2013-11-07 LAB — GLUCOSE, CAPILLARY
Glucose-Capillary: 112 mg/dL — ABNORMAL HIGH (ref 70–99)
Glucose-Capillary: 139 mg/dL — ABNORMAL HIGH (ref 70–99)

## 2013-11-08 ENCOUNTER — Ambulatory Visit: Payer: Self-pay | Admitting: Cardiology

## 2013-11-08 NOTE — Progress Notes (Signed)
Case discussed with Dr. Sadek soon after the resident saw the patient.  We reviewed the resident's history and exam and pertinent patient test results.  I agree with the assessment, diagnosis, and plan of care documented in the resident's note. 

## 2013-11-09 ENCOUNTER — Encounter (HOSPITAL_COMMUNITY)
Admission: RE | Admit: 2013-11-09 | Discharge: 2013-11-09 | Disposition: A | Discharge status: Home / Self Care | Payer: BC Managed Care – PPO | Source: Ambulatory Visit | Attending: Cardiology | Admitting: Cardiology

## 2013-11-09 ENCOUNTER — Encounter (HOSPITAL_COMMUNITY): Payer: BC Managed Care – PPO

## 2013-11-09 ENCOUNTER — Other Ambulatory Visit (HOSPITAL_COMMUNITY): Payer: Self-pay | Admitting: Physician Assistant

## 2013-11-11 ENCOUNTER — Other Ambulatory Visit: Payer: Self-pay

## 2013-11-11 ENCOUNTER — Ambulatory Visit (HOSPITAL_COMMUNITY): Payer: BC Managed Care – PPO

## 2013-11-11 ENCOUNTER — Telehealth: Payer: Self-pay | Admitting: *Deleted

## 2013-11-11 ENCOUNTER — Encounter (HOSPITAL_COMMUNITY): Payer: BC Managed Care – PPO

## 2013-11-11 ENCOUNTER — Emergency Department (HOSPITAL_COMMUNITY)
Admission: EM | Admit: 2013-11-11 | Discharge: 2013-11-11 | Discharge status: Against Medical Advice | Payer: BC Managed Care – PPO | Attending: Emergency Medicine | Admitting: Emergency Medicine

## 2013-11-11 ENCOUNTER — Other Ambulatory Visit (HOSPITAL_COMMUNITY): Payer: BC Managed Care – PPO

## 2013-11-11 ENCOUNTER — Encounter (HOSPITAL_COMMUNITY)
Admission: RE | Admit: 2013-11-11 | Discharge: 2013-11-11 | Disposition: A | Discharge status: Home / Self Care | Payer: BC Managed Care – PPO | Source: Ambulatory Visit | Attending: Cardiology | Admitting: Cardiology

## 2013-11-11 ENCOUNTER — Encounter (HOSPITAL_COMMUNITY): Payer: Self-pay | Admitting: Emergency Medicine

## 2013-11-11 ENCOUNTER — Ambulatory Visit (INDEPENDENT_AMBULATORY_CARE_PROVIDER_SITE_OTHER): Payer: BC Managed Care – PPO | Admitting: Internal Medicine

## 2013-11-11 VITALS — BP 110/72 | HR 62 | Temp 97.7°F | Ht 67.0 in | Wt 269.3 lb

## 2013-11-11 DIAGNOSIS — W19XXXA Unspecified fall, initial encounter: Secondary | ICD-10-CM

## 2013-11-11 DIAGNOSIS — Z043 Encounter for examination and observation following other accident: Secondary | ICD-10-CM | POA: Insufficient documentation

## 2013-11-11 DIAGNOSIS — J45901 Unspecified asthma with (acute) exacerbation: Secondary | ICD-10-CM | POA: Insufficient documentation

## 2013-11-11 DIAGNOSIS — E1142 Type 2 diabetes mellitus with diabetic polyneuropathy: Secondary | ICD-10-CM | POA: Insufficient documentation

## 2013-11-11 DIAGNOSIS — I10 Essential (primary) hypertension: Secondary | ICD-10-CM | POA: Insufficient documentation

## 2013-11-11 DIAGNOSIS — E1149 Type 2 diabetes mellitus with other diabetic neurological complication: Secondary | ICD-10-CM | POA: Insufficient documentation

## 2013-11-11 DIAGNOSIS — Y92009 Unspecified place in unspecified non-institutional (private) residence as the place of occurrence of the external cause: Secondary | ICD-10-CM

## 2013-11-11 DIAGNOSIS — R296 Repeated falls: Secondary | ICD-10-CM | POA: Insufficient documentation

## 2013-11-11 DIAGNOSIS — I509 Heart failure, unspecified: Secondary | ICD-10-CM | POA: Insufficient documentation

## 2013-11-11 DIAGNOSIS — Z8701 Personal history of pneumonia (recurrent): Secondary | ICD-10-CM | POA: Insufficient documentation

## 2013-11-11 DIAGNOSIS — Y9289 Other specified places as the place of occurrence of the external cause: Secondary | ICD-10-CM | POA: Insufficient documentation

## 2013-11-11 DIAGNOSIS — S0003XA Contusion of scalp, initial encounter: Secondary | ICD-10-CM

## 2013-11-11 DIAGNOSIS — Y939 Activity, unspecified: Secondary | ICD-10-CM | POA: Insufficient documentation

## 2013-11-11 DIAGNOSIS — R51 Headache: Secondary | ICD-10-CM

## 2013-11-11 LAB — BASIC METABOLIC PANEL
CO2: 30 mEq/L (ref 19–32)
Chloride: 99 mEq/L (ref 96–112)
Creatinine, Ser: 1.07 mg/dL (ref 0.50–1.35)
Potassium: 4.4 mEq/L (ref 3.5–5.1)
Sodium: 138 mEq/L (ref 135–145)

## 2013-11-11 LAB — CBC
Hemoglobin: 13.3 g/dL (ref 13.0–17.0)
MCV: 93.5 fL (ref 78.0–100.0)
Platelets: 283 10*3/uL (ref 150–400)
RBC: 4.33 MIL/uL (ref 4.22–5.81)
RDW: 14.2 % (ref 11.5–15.5)
WBC: 7.1 10*3/uL (ref 4.0–10.5)

## 2013-11-11 NOTE — ED Notes (Addendum)
Pt upset that he had a CXR ordered and not a CT head. States his internal medicine doctor told him to come right over and the ER doctor would immediately see him and place orders. Explained ER procedures and protocols to pt and told him that I would speak with md about his care. When i returned to the waiting room pt had left. Another staff member saw him walk out of ed. Called for pt several times without answer

## 2013-11-11 NOTE — ED Notes (Signed)
Pt sts fell in bathroom yesterday but does not remember fall and unsure if syncope; pt sts taking xerelto and sent here for eval

## 2013-11-11 NOTE — Telephone Encounter (Signed)
Call from Cardiac rehab.  Pt is being seen and stated he fell in his bathroom a few days ago.   He has a good size hematoma on side of forehead, c/o tenderness to area and headache. No LOC but pt is on Xarelto.   Will see today at 1:00

## 2013-11-11 NOTE — Progress Notes (Signed)
   Subjective:    Patient ID: Jefm Bryant, male    DOB: [birth year], 52 y.o.   MRN: [id number]  HPI Mr. Sadlon is a 52 yo complicated pmh presents acutely after 2 falls at home from the shower hitting his head and having ongoing HA. Pt states that since the incident 3 days ago has had increasing hematoma and pain and unmanaged HA. No blurry vision, dysarthria, or dysphagia.    Review of Systems  Constitutional: Negative for fever, chills, activity change and fatigue.  HENT: Positive for facial swelling (near traumatic site). Negative for hearing loss.   Eyes: Negative for photophobia, pain and visual disturbance.  Respiratory: Negative for cough, chest tightness and shortness of breath.   Cardiovascular: Negative for chest pain, palpitations and leg swelling.  Neurological: Positive for dizziness and headaches. Negative for seizures, syncope, weakness, light-headedness and numbness.       Objective:   Physical Exam Filed Vitals:   11/11/13 1330  BP: 110/72  Pulse: 62  Temp: 97.7 F (36.5 C)   General: sitting in chair, NAD HEENT: PERRL, EOMI, no scleral icterus, moderate sized 4cm radius elevation on left lateral aspect of head near temporal region Cardiac: RRR, no rubs, murmurs or gallops Pulm: clear to auscultation bilaterally, moving normal volumes of air Abd: soft, nontender, nondistended, BS present Ext: warm and well perfused, no pedal edema Neuro: alert and oriented X3, cranial nerves II-XII grossly intact     Assessment & Plan:  1. Fall: sent to ED for immediate evaluation and stat head CT since on Xarelto and not improving with large temporal hematoma  Pt discussed with Dr. Kem Kays

## 2013-11-11 NOTE — Progress Notes (Signed)
Pt returned to exercise today.  Pt reported to rehab staff that he had fallen at home in the bathroom.  Inquired further, pt states that this occurred a couple of days ago.  Pt complains of headache and tenderness to his left forehead.  Visible hematoma noted and palpated.  Pt complains of some discomfort when touching the area.  Pt denies losing consciousness but doesn't remember the details of what happened prior to the fall.  Family members that were in the home heard a loud noise and went to the door to check on him.  He was able to answer appropriately and readily.  Pt asked why he didn't;t call his doctor.  Pt states didn't think it was a big deal.  Later pt thought he should have himself checked out because he is taking a blood thinner - Xeralto.  Called [place] [facility].  Able to get an appt for pt to be seen in the clinic as a add on at 1 pm.  Pt verbalized understanding and is in full agreement.  Pt given rehab report for MD review.  Noted weight gain since Wednesday of 2 kg. Alanson Aly, BSN

## 2013-11-14 ENCOUNTER — Encounter (HOSPITAL_COMMUNITY): Payer: BC Managed Care – PPO

## 2013-11-14 ENCOUNTER — Other Ambulatory Visit: Payer: Self-pay | Admitting: Internal Medicine

## 2013-11-14 LAB — POCT I-STAT TROPONIN I: Troponin i, poc: 0.02 ng/mL (ref 0.00–0.08)

## 2013-11-14 MED ORDER — COLCHICINE 0.6 MG PO TABS: 0.6000 mg | ORAL_TABLET | Freq: Every day | ORAL | Status: DC

## 2013-11-15 NOTE — Telephone Encounter (Signed)
Medication at front desk ready for pickup, patient aware

## 2013-11-15 NOTE — Progress Notes (Signed)
Case discussed with Dr. Sadek at time of visit.  We reviewed the resident's history and exam and pertinent patient test results.  I agree with the assessment, diagnosis, and plan of care documented in the resident's note. 

## 2013-11-15 NOTE — Telephone Encounter (Signed)
New message  Pt called states that he had his Viagra medication mailed to [facility] He states that after tracking it to this facility it was signed for on 11/08/2014. Pt would like to come pick it up. Called front office it was not there.. Please assist

## 2013-11-16 ENCOUNTER — Encounter (HOSPITAL_COMMUNITY): Payer: BC Managed Care – PPO

## 2013-11-16 ENCOUNTER — Other Ambulatory Visit (HOSPITAL_COMMUNITY): Payer: Self-pay | Admitting: Physician Assistant

## 2013-11-18 ENCOUNTER — Encounter (HOSPITAL_COMMUNITY): Payer: BC Managed Care – PPO

## 2013-11-21 ENCOUNTER — Other Ambulatory Visit (HOSPITAL_COMMUNITY): Payer: Self-pay | Admitting: Physician Assistant

## 2013-11-21 ENCOUNTER — Encounter (HOSPITAL_COMMUNITY): Payer: BC Managed Care – PPO

## 2013-11-22 ENCOUNTER — Other Ambulatory Visit (HOSPITAL_COMMUNITY): Payer: Self-pay | Admitting: Cardiology

## 2013-11-22 ENCOUNTER — Telehealth: Payer: Self-pay | Admitting: Cardiology

## 2013-11-22 MED ORDER — FUROSEMIDE 80 MG PO TABS: ORAL_TABLET | ORAL | Status: DC

## 2013-11-22 NOTE — Telephone Encounter (Signed)
Labs done 12/19 and scheduled follow up labs 12/05/13, patient aware.

## 2013-11-22 NOTE — Telephone Encounter (Signed)
New message    Need to have dosage of furosemide changed.  Taking 2 a day sometimes if he retains fluid.  Need dosage to say take 1-2 tablets a day and get 60 tabs at a time instead of 30.

## 2013-11-22 NOTE — Telephone Encounter (Signed)
Patient takes every other day an extra 80 mg of lasix. Will forward to Dawayne Patricia NP for review

## 2013-11-22 NOTE — Telephone Encounter (Signed)
Ok to do but make sure he has had recent labs/follow up etc.

## 2013-11-23 ENCOUNTER — Encounter (HOSPITAL_COMMUNITY)
Admission: RE | Admit: 2013-11-23 | Discharge: 2013-11-23 | Disposition: A | Discharge status: Home / Self Care | Payer: BC Managed Care – PPO | Source: Ambulatory Visit | Attending: Cardiology | Admitting: Cardiology

## 2013-11-23 ENCOUNTER — Encounter (HOSPITAL_COMMUNITY): Payer: BC Managed Care – PPO

## 2013-11-23 LAB — GLUCOSE, CAPILLARY
Glucose-Capillary: 125 mg/dL — ABNORMAL HIGH (ref 70–99)
Glucose-Capillary: 149 mg/dL — ABNORMAL HIGH (ref 70–99)

## 2013-11-25 ENCOUNTER — Telehealth: Payer: Self-pay | Admitting: *Deleted

## 2013-11-25 ENCOUNTER — Encounter (HOSPITAL_COMMUNITY)
Admission: RE | Admit: 2013-11-25 | Discharge: 2013-11-25 | Disposition: A | Discharge status: Home / Self Care | Payer: BC Managed Care – PPO | Source: Ambulatory Visit | Attending: Cardiology | Admitting: Cardiology

## 2013-11-25 ENCOUNTER — Encounter (HOSPITAL_COMMUNITY): Payer: BC Managed Care – PPO

## 2013-11-25 DIAGNOSIS — Z8679 Personal history of other diseases of the circulatory system: Secondary | ICD-10-CM | POA: Insufficient documentation

## 2013-11-25 DIAGNOSIS — Z8249 Family history of ischemic heart disease and other diseases of the circulatory system: Secondary | ICD-10-CM | POA: Insufficient documentation

## 2013-11-25 DIAGNOSIS — G4733 Obstructive sleep apnea (adult) (pediatric): Secondary | ICD-10-CM | POA: Insufficient documentation

## 2013-11-25 DIAGNOSIS — F17200 Nicotine dependence, unspecified, uncomplicated: Secondary | ICD-10-CM | POA: Insufficient documentation

## 2013-11-25 DIAGNOSIS — I5032 Chronic diastolic (congestive) heart failure: Secondary | ICD-10-CM | POA: Insufficient documentation

## 2013-11-25 DIAGNOSIS — Z7982 Long term (current) use of aspirin: Secondary | ICD-10-CM | POA: Insufficient documentation

## 2013-11-25 DIAGNOSIS — I10 Essential (primary) hypertension: Secondary | ICD-10-CM | POA: Insufficient documentation

## 2013-11-25 DIAGNOSIS — Z5189 Encounter for other specified aftercare: Secondary | ICD-10-CM | POA: Insufficient documentation

## 2013-11-25 DIAGNOSIS — Z79899 Other long term (current) drug therapy: Secondary | ICD-10-CM | POA: Insufficient documentation

## 2013-11-25 DIAGNOSIS — I509 Heart failure, unspecified: Secondary | ICD-10-CM | POA: Insufficient documentation

## 2013-11-25 DIAGNOSIS — I4892 Unspecified atrial flutter: Secondary | ICD-10-CM | POA: Insufficient documentation

## 2013-11-25 LAB — GLUCOSE, CAPILLARY
GLUCOSE-CAPILLARY: 129 mg/dL — AB (ref 70–99)
GLUCOSE-CAPILLARY: 129 mg/dL — AB (ref 70–99)

## 2013-11-25 NOTE — Progress Notes (Signed)
Spoke with Dr Burtis Junes about Jeremiah Perkins. Dr Burtis Junes said Jeremiah Perkins is okay to return to exercise at cardiac rehab as long as he does not have and complaints of headache or neurologic difficulty.

## 2013-11-25 NOTE — Telephone Encounter (Signed)
Pt states he was SOB per [facility] and he took an extra lasix 80 mg today. Wt gain of 0.6 lb/ pt denies edema, lungs are clear, pt has been eating ham. Asked nurse to review high sodium foods to avoid like canned soup/ ham/ restaurant foods. Pt already has a lab date in 7 days for bmet and has f/u app with Dr Patty Sermons. Told to call with further questions or concerns.

## 2013-11-25 NOTE — Progress Notes (Signed)
Patient reports feeling short of breath took an extra lasix today at home.Marland Kitchen Upon assessment lung fields clear upon auscultation. Minimal lower extremity edema noted. Oxygen saturation 96% on room air. Dr Yevonne Pax office called and notified spoke with  [facility] RN.  Patient instructed to watch his sodium intake. Weight today is 122.3 kg  Weight was 121.7 on 11/23/2013. Will continue to monitor the patient throughout  the program. Jeremiah Perkins said he ate Ham yesterday and has been holiday eating.  No other complaints noted today during exercise at cardiac rehab.

## 2013-11-28 ENCOUNTER — Encounter (HOSPITAL_COMMUNITY)
Admission: RE | Admit: 2013-11-28 | Discharge: 2013-11-28 | Disposition: A | Discharge status: Home / Self Care | Payer: BC Managed Care – PPO | Source: Ambulatory Visit | Attending: Cardiology | Admitting: Cardiology

## 2013-11-28 ENCOUNTER — Encounter (HOSPITAL_COMMUNITY): Payer: BC Managed Care – PPO

## 2013-11-28 LAB — GLUCOSE, CAPILLARY: Glucose-Capillary: 138 mg/dL — ABNORMAL HIGH (ref 70–99)

## 2013-11-30 ENCOUNTER — Encounter (HOSPITAL_COMMUNITY)
Admission: RE | Admit: 2013-11-30 | Discharge: 2013-11-30 | Disposition: A | Discharge status: Home / Self Care | Payer: BC Managed Care – PPO | Source: Ambulatory Visit | Attending: Cardiology | Admitting: Cardiology

## 2013-11-30 ENCOUNTER — Encounter (HOSPITAL_COMMUNITY): Payer: BC Managed Care – PPO

## 2013-11-30 LAB — GLUCOSE, CAPILLARY: Glucose-Capillary: 163 mg/dL — ABNORMAL HIGH (ref 70–99)

## 2013-11-30 NOTE — Progress Notes (Signed)
Pt did not check his blood glucose at home.  Pt pre exercise blood glucose reading was 163.  Pt had not eaten breakfast which is typical for him but did have a cup of coffee with sugar.  Pt with recent change in medication.  Metformin was decreased 1,000 mg daily and remaining on Vyctozia daily.  Pt presently takes this all in the morning.  Will confer with Marily Memos RD on what would be the appropriate time to take the medications.  Since pt is not on long acting Metformin he should try to take this in the evening and the Vyctozia in the morning.  Pt will make this change starting on tomorrow since he has already had his dose for today.  Will follow up with pt on Friday when he returns to exercise. Advised pt that during this change of medications and transition to new dosage to monitor his blood glucose at home regularly.  Pt verbalized understanding and is in agreement with this.

## 2013-12-02 ENCOUNTER — Encounter (HOSPITAL_COMMUNITY)
Admission: RE | Admit: 2013-12-02 | Discharge: 2013-12-02 | Disposition: A | Discharge status: Home / Self Care | Payer: BC Managed Care – PPO | Source: Ambulatory Visit | Attending: Cardiology | Admitting: Cardiology

## 2013-12-02 ENCOUNTER — Encounter (HOSPITAL_COMMUNITY): Payer: BC Managed Care – PPO

## 2013-12-02 LAB — GLUCOSE, CAPILLARY: Glucose-Capillary: 145 mg/dL — ABNORMAL HIGH (ref 70–99)

## 2013-12-02 NOTE — Progress Notes (Signed)
Jeremiah Perkins 53 y.o. male Nutrition Note Spoke with pt.  Nutrition Plan and Nutrition Survey goals reviewed with pt. Pt is following Step 2 of the Therapeutic Lifestyle Changes diet. Pt wants to lose wt. Pt has been trying to lose wt by "eating one meal/day and making better food choices (e.g. Eating more fruit and vegetables)." Per pt, pt wt was 345 lb in 2012 and 295 lb 11/2012. Pt current wt is down 35 lb over the past year. Pt wt reviewed in EMR. Wt loss tips including eating more frequently discussed.  Pt is diabetic. Last A1c indicates blood glucose well-controlled. Pt does not check CBG's at home due to "the cost of strips." Pt states he does not feel like he needs to check his CBG's frequently due to his A1c "being so good." Diabetic supply resources discussed. This Probation officer went over Diabetes Education test results. Pt expressed understanding of the information reviewed. Pt aware of nutrition education classes offered and is unable to attend nutrition classes. Nutrition Diagnosis   Food-and nutrition-related knowledge deficit related to lack of exposure to information as related to diagnosis of: ? CVD ? DM (A1c 6.0)   Obesity related to excessive energy intake as evidenced by a BMI of 39.8  Nutrition RX/ Estimated Daily Nutrition Needs for: wt loss  1800-2300 Kcal, 50-60 gm fat, 11-15 gm sat fat, 1.7-2.3 gm trans-fat, <1500 mg sodium, 250 gm CHO   Nutrition Intervention   Pt's individual nutrition plan reviewed with pt.   Benefits of adopting Therapeutic Lifestyle Changes discussed when Medficts reviewed.   Pt to attend the Portion Distortion class - met 11/23/13   Pt to attend the  ? Diabetes Q & A class - met 09/30/13 and 11/11/13   Pt declined handouts for: ? Nutrition I class ? Nutrition II class ? Diabetes Blitz class at this time.   Continue client-centered nutrition education by RD, as part of interdisciplinary care. Goal(s)   Pt to identify food quantities necessary to achieve: ?  wt loss to a goal wt of 236-254 lb (107.1-115.3 kg) at graduation from cardiac rehab.    Use pre-meal and post-meal CBG's and A1c to determine whether adjustments in food/meal planning will be beneficial or if any meds need to be combined with nutrition therapy. Monitor and Evaluate progress toward nutrition goal with team. Nutrition Risk: Change to Moderate [name], M.Ed, RD, LDN, CDE 12/02/2013 1:33 PM

## 2013-12-04 ENCOUNTER — Other Ambulatory Visit: Payer: Self-pay | Admitting: Cardiology

## 2013-12-05 ENCOUNTER — Encounter (HOSPITAL_COMMUNITY): Payer: BC Managed Care – PPO

## 2013-12-05 ENCOUNTER — Other Ambulatory Visit: Payer: BC Managed Care – PPO

## 2013-12-07 ENCOUNTER — Other Ambulatory Visit: Payer: Self-pay | Admitting: *Deleted

## 2013-12-07 ENCOUNTER — Encounter (HOSPITAL_COMMUNITY)
Admission: RE | Admit: 2013-12-07 | Discharge: 2013-12-07 | Disposition: A | Discharge status: Home / Self Care | Payer: BC Managed Care – PPO | Source: Ambulatory Visit | Attending: Cardiology | Admitting: Cardiology

## 2013-12-07 ENCOUNTER — Ambulatory Visit (INDEPENDENT_AMBULATORY_CARE_PROVIDER_SITE_OTHER): Payer: BC Managed Care – PPO | Admitting: *Deleted

## 2013-12-07 ENCOUNTER — Encounter (HOSPITAL_COMMUNITY): Payer: BC Managed Care – PPO

## 2013-12-07 DIAGNOSIS — I10 Essential (primary) hypertension: Secondary | ICD-10-CM

## 2013-12-07 DIAGNOSIS — Z79899 Other long term (current) drug therapy: Secondary | ICD-10-CM

## 2013-12-07 DIAGNOSIS — I5022 Chronic systolic (congestive) heart failure: Secondary | ICD-10-CM

## 2013-12-07 LAB — BASIC METABOLIC PANEL
BUN: 27 mg/dL — ABNORMAL HIGH (ref 6–23)
CALCIUM: 9.6 mg/dL (ref 8.4–10.5)
CHLORIDE: 100 meq/L (ref 96–112)
CO2: 28 meq/L (ref 19–32)
CREATININE: 1.3 mg/dL (ref 0.4–1.5)
GFR: 74.54 mL/min (ref >60.00)
GLUCOSE: 110 mg/dL — AB (ref 70–99)
Potassium: 3.8 mEq/L (ref 3.5–5.1)
Sodium: 137 mEq/L (ref 135–145)

## 2013-12-07 LAB — GLUCOSE, CAPILLARY: Glucose-Capillary: 115 mg/dL — ABNORMAL HIGH (ref 70–99)

## 2013-12-07 MED ORDER — LIRAGLUTIDE 18 MG/3ML ~~LOC~~ SOPN: 1.8000 mg | PEN_INJECTOR | Freq: Every day | SUBCUTANEOUS | Status: DC

## 2013-12-07 NOTE — Progress Notes (Signed)
Quick Note:  Please report to patient. The recent labs are stable. Continue same medication and careful diet. ______ 

## 2013-12-07 NOTE — Telephone Encounter (Signed)
Rx called in to pharmacy. 

## 2013-12-08 ENCOUNTER — Encounter: Payer: Self-pay | Admitting: Internal Medicine

## 2013-12-08 ENCOUNTER — Ambulatory Visit: Payer: BC Managed Care – PPO | Admitting: Internal Medicine

## 2013-12-09 ENCOUNTER — Encounter (HOSPITAL_COMMUNITY)
Admission: RE | Admit: 2013-12-09 | Discharge: 2013-12-09 | Disposition: A | Discharge status: Home / Self Care | Payer: BC Managed Care – PPO | Source: Ambulatory Visit | Attending: Cardiology | Admitting: Cardiology

## 2013-12-09 ENCOUNTER — Telehealth: Payer: Self-pay | Admitting: *Deleted

## 2013-12-09 ENCOUNTER — Encounter: Payer: BC Managed Care – PPO | Admitting: Internal Medicine

## 2013-12-09 ENCOUNTER — Encounter (HOSPITAL_COMMUNITY): Payer: BC Managed Care – PPO

## 2013-12-09 LAB — GLUCOSE, CAPILLARY: Glucose-Capillary: 113 mg/dL — ABNORMAL HIGH (ref 70–99)

## 2013-12-09 NOTE — Telephone Encounter (Signed)
Carla from cardiac rehab called with an update and needing advice. Wt 1/14 120.6 kg Today 122.6 kg  Pt has been increasing wt (4.4lb) and increasing lasix to 160 mg daily x 3 days and wt continues to elevate. C/o abdomen feeling full and trace ankle edema. Pt has been eating canned pre made chili and grilled cheese sandwiches.  I will forward to Dr Patty Sermons nurse to advise.

## 2013-12-09 NOTE — Telephone Encounter (Signed)
Patient states that he had eaten 1 bowl of chili and a soda. States he does not eat canned food. He denies any swelling in his feet stating he notices fluid when he lays down at night. He did notice that on Wednesday night so on Thursday he started taking 160 mg of Furosemide daily. Patient did have some shortness of breath this morning with exercise at cardiac rehab and was unable to complete session. Discussed with Dawayne Patricia NP and will have patient continue the Furosemide 80 mg twice a day over there weekend and to watch his sodium intake. Advised patient and reiterated the importance of avoiding sodium. If worse go to ED. Call back Monday with update. Patient verbalized understanding.

## 2013-12-09 NOTE — Telephone Encounter (Signed)
Agree with plann.

## 2013-12-09 NOTE — Progress Notes (Signed)
Noted weight elevation of 2 kg from Wednesday exercise session.  Pt with clear on right with diminished breath sounds on the Left, o2 st 99, pt complained of feeling full in the abdominal area.  Pt took extra lasix over the last 2 days.  Triage nurse notified and updated with events.  Will confer with md and call pt with instructions.  Pt verbalized understanding and is in agreement.

## 2013-12-12 ENCOUNTER — Encounter (HOSPITAL_COMMUNITY): Payer: BC Managed Care – PPO

## 2013-12-12 ENCOUNTER — Encounter (HOSPITAL_COMMUNITY)
Admission: RE | Admit: 2013-12-12 | Discharge: 2013-12-12 | Disposition: A | Discharge status: Home / Self Care | Payer: BC Managed Care – PPO | Source: Ambulatory Visit | Attending: Cardiology | Admitting: Cardiology

## 2013-12-12 LAB — GLUCOSE, CAPILLARY: Glucose-Capillary: 119 mg/dL — ABNORMAL HIGH (ref 70–99)

## 2013-12-12 NOTE — Progress Notes (Signed)
Pt returned to exercise.  Pt had received message from Dr. Yevonne Pax nurse to increase his lasix to twice a day.  Pt did so over the weekend.  Wt elevated .4 kg from Friday's weight which was 2kg above Thursday's weight. Pt feels tired and short of breath is not more than his usual. Lungs clear with faint crackles to the right upper lobe.  Pt feels swelling in the abdominal area but no peripheral edema noted.  Will send in basket as an update to Dr. Yevonne Pax nurse for follow up.  Alanson Aly, BSN

## 2013-12-13 NOTE — Telephone Encounter (Signed)
Yes please refer to Heart failure clinic if patient is agreeable to this.

## 2013-12-13 NOTE — Telephone Encounter (Signed)
Message copied by Burnell Blanks on Tue Dec 13, 2013 11:19 AM ------      Message from: Karlene Lineman B      Created: Mon Dec 12, 2013  4:35 PM      Regarding: Update from Friday's Triage event        Hi Juliette Alcide            Thanks for your help on Friday. I really appreciate it.  Pt did as instructed took 80 mg of Lasix twice a day over the weekend.  Weight remains elevated and in fact he is up .4kg.  Lungs clear with faint crackles R upper lobe, no peripheral edema noted.  Weight as followed last couple weeks            1/2  122.3kg  269.0 lbs.      1/5  120.7kg 265.4 lbs.      1/7  121.4kg 267.0 lbs.      1/14 120.8 kg 265.7lbs.      1/16 122.6kg 269.7lbs.      1/19 123.0kg 270.6 lbs            As you see his weight is all over the map.  First day of exercise weight was  115.8kg.       Do you think he would benefit from a referral to the Heart Failure clinic?            Your thoughts      Carlette ------

## 2013-12-13 NOTE — Telephone Encounter (Signed)
Will forward to  Dr. Brackbill for review 

## 2013-12-14 ENCOUNTER — Encounter (HOSPITAL_COMMUNITY)
Admission: RE | Admit: 2013-12-14 | Discharge: 2013-12-14 | Disposition: A | Discharge status: Home / Self Care | Payer: BC Managed Care – PPO | Source: Ambulatory Visit | Attending: Cardiology | Admitting: Cardiology

## 2013-12-14 ENCOUNTER — Encounter (HOSPITAL_COMMUNITY): Payer: BC Managed Care – PPO

## 2013-12-14 LAB — GLUCOSE, CAPILLARY: Glucose-Capillary: 105 mg/dL — ABNORMAL HIGH (ref 70–99)

## 2013-12-14 NOTE — Telephone Encounter (Signed)
Spoke with patient and he is willing to go. Called CHF clinic and spoke Chantal and she will call patient .

## 2013-12-16 ENCOUNTER — Encounter (HOSPITAL_COMMUNITY): Payer: BC Managed Care – PPO

## 2013-12-16 ENCOUNTER — Encounter (HOSPITAL_COMMUNITY)
Admission: RE | Admit: 2013-12-16 | Discharge: 2013-12-16 | Disposition: A | Discharge status: Home / Self Care | Payer: BC Managed Care – PPO | Source: Ambulatory Visit | Attending: Cardiology | Admitting: Cardiology

## 2013-12-16 LAB — GLUCOSE, CAPILLARY
GLUCOSE-CAPILLARY: 101 mg/dL — AB (ref 70–99)
Glucose-Capillary: 94 mg/dL (ref 70–99)

## 2013-12-19 ENCOUNTER — Encounter (HOSPITAL_COMMUNITY): Payer: BC Managed Care – PPO

## 2013-12-19 ENCOUNTER — Encounter (HOSPITAL_COMMUNITY)
Admission: RE | Admit: 2013-12-19 | Discharge: 2013-12-19 | Disposition: A | Discharge status: Home / Self Care | Payer: BC Managed Care – PPO | Source: Ambulatory Visit | Attending: Cardiology | Admitting: Cardiology

## 2013-12-19 LAB — GLUCOSE, CAPILLARY
GLUCOSE-CAPILLARY: 115 mg/dL — AB (ref 70–99)
Glucose-Capillary: 134 mg/dL — ABNORMAL HIGH (ref 70–99)

## 2013-12-20 ENCOUNTER — Ambulatory Visit (HOSPITAL_COMMUNITY)
Admission: RE | Admit: 2013-12-20 | Discharge: 2013-12-20 | Disposition: A | Discharge status: Home / Self Care | Payer: BC Managed Care – PPO | Source: Ambulatory Visit | Attending: Internal Medicine | Admitting: Internal Medicine

## 2013-12-20 VITALS — BP 92/54 | HR 55 | Wt 265.0 lb

## 2013-12-20 DIAGNOSIS — I509 Heart failure, unspecified: Secondary | ICD-10-CM | POA: Insufficient documentation

## 2013-12-20 DIAGNOSIS — I4892 Unspecified atrial flutter: Secondary | ICD-10-CM | POA: Insufficient documentation

## 2013-12-20 DIAGNOSIS — I5022 Chronic systolic (congestive) heart failure: Secondary | ICD-10-CM | POA: Insufficient documentation

## 2013-12-20 MED ORDER — TORSEMIDE 20 MG PO TABS: ORAL_TABLET | ORAL | Status: DC

## 2013-12-20 MED ORDER — SPIRONOLACTONE 25 MG PO TABS
12.5000 mg | ORAL_TABLET | Freq: Every day | ORAL | Status: DC
Start: 2013-12-20 — End: 2014-04-26

## 2013-12-20 MED ORDER — CARVEDILOL 6.25 MG PO TABS: 6.2500 mg | ORAL_TABLET | Freq: Two times a day (BID) | ORAL | Status: DC

## 2013-12-20 MED ORDER — AMIODARONE HCL 200 MG PO TABS: 200.0000 mg | ORAL_TABLET | Freq: Every day | ORAL | Status: DC

## 2013-12-20 NOTE — Patient Instructions (Addendum)
Change your coreg to 6.25 mg (1 tablet) in the morning and 6.25 mg (1 tablet) in the evening.  Decrease your amiodarone to 200 mg (1 tablet) daily.  Start taking spironolactone 12.5 mg (1/2 tablet) daily.  Stop taking lasix.  Start taking torsemide 80 mg (4 tablets) daily.  Go to [facility] next Tuesday for labs, 3rd floor anytime.  Will refer to Electrophysiologist.  F/U 3 weeks.  Do the following things EVERYDAY: 1) Weigh yourself in the morning before breakfast. Write it down and keep it in a log. 2) Take your medicines as prescribed 3) Eat low salt foods-Limit salt (sodium) to 2000 mg per day.  4) Stay as active as you can everyday 5) Limit all fluids for the day to less than 2 liters

## 2013-12-20 NOTE — Progress Notes (Signed)
Patient ID: Jeremiah Perkins, male   DOB: Mar 10, 1961, 53 y.o.   MRN: [id number]  Cardiologist: Dr. Patty Sermons PCP:  Dr. Christen Bame  HPI: Jeremiah Perkins is a 53 yo AA male who is followed by Dr. Patty Sermons who was referred to the HF clinic. Jeremiah Perkins has a history of HTN, NICM, normal cors (2014), chronic systolic HF, HLD, Atrial flutter s/p TEE DC-CV 07/2013 and repeat DCCV 10/2013, OSA, DM2, and obesity.  Has been admitted 2 times in the past 6 months and in August EF was depressed from 50-55% (2012) to 30-35%. It has been thought that his HF maybe tachycardia-mediated since Jeremiah Perkins had [facility] 07/2013 showing normal coronaries and has needed cardioversion twice for atrial flutter in past 6 months. Last ECHO showed EF 25-30%, diff HK, and RV moderately HK.   Participating in CR. + DOE with minimal activity (15-20 ft) and can't go upstairs. +CP when Jeremiah Perkins lays on his back, + abdominal distention, + bendopnea and dizziness. Denies orthopnea, PND or edema. Does not wear CPAP nightly. Weight at home 263-265 lbs. Following low salt diet and drinking less than 2 L a day. Last note from [facility] patient was supposed to be taking coreg 12.5 mg q am and 6.25 mg qpm, however has been taking coreg 12.5 mg once daily. Jeremiah Perkins also was supposed to decreased amiodarone to 200 mg daily, but has been taking 200 BID.   SH: Reports Jeremiah Perkins was a heavy drinker (binge drinker up until 2013). Will now have occasional drink here and there. Trying to get disability, worked as a Barrister's clerk  FH: Mother and brother CAD with CABG, mother at 47 and brother at 87.  ROS: All systems negative except as listed in HPI, PMH and Problem List.  Labs (11/14): HCT 42.5 Labs (1/15): K 3.8, creatinine 1.3  ECG: NSR at 53, low voltage, poor RWP, prolonged QT interval  PMH: 1. Type II diabetes with peripheral neuropathy.  2. Nonischemic cardiomyopathy: Echo 2012 with EF 55%.  LHC (9/14) with normal coronaries and EF 20-25%.  ? Tachy-mediated CMP as the  finding of low EF has corresponded with runs of atrial flutter with RVR.  Alternatively, the atrial flutter could have been triggered by the cardiomyopathy itself.  TEE (9/14) with EF 25-30%, diffuse hypokinesis, mild MR, moderately hypokinetic RV.   3. Atrial flutter: Paroxysmal.  9/14 had TEE-guided DCCV.  12/14 had another DCCV.  4. OSA on CPAP 5. Obesity 6. HTN 7. Gout 8. Depression 9. Diverticulosis 10. GERD 11. Hyperlipidemia  Current Outpatient Prescriptions  Medication Sig Dispense Refill  . allopurinol (ZYLOPRIM) 300 MG tablet Take 1 tablet (300 mg total) by mouth daily.  30 tablet  3  . amiodarone (PACERONE) 200 MG tablet TAKE 1 TABLET (200 MG TOTAL) BY MOUTH 2 (TWO) TIMES DAILY.  60 tablet  3  . atorvastatin (LIPITOR) 40 MG tablet Take 1 tablet (40 mg total) by mouth daily.  30 tablet  0  . Blood Glucose Monitoring Suppl (AGAMATRIX PRESTO PRO METER) DEVI 1 Device by Does not apply route once.  1 Device  0  . buPROPion (WELLBUTRIN XL) 150 MG 24 hr tablet Take 1 tablet (150 mg total) by mouth every morning.  30 tablet  2  . carvedilol (COREG) 6.25 MG tablet Take 6.25 mg by mouth daily.       . colchicine 0.6 MG tablet Take 1 tablet (0.6 mg total) by mouth daily.  30 tablet  2  . docusate sodium (COLACE) 100 MG  capsule Take 100 mg by mouth daily as needed for mild constipation. For constipation      . furosemide (LASIX) 80 MG tablet 1 tablet daily and may use extra tablet as needed for fluid  60 tablet  3  . gabapentin (NEURONTIN) 600 MG tablet Takes 600mg  in the am & 1200mg  in the pm      . glucose blood (AGAMATRIX PRESTO TEST) test strip Use as instructed  100 each  12  . HYDROcodone-acetaminophen (NORCO/VICODIN) 5-325 MG per tablet Take 1-2 tablets by mouth every 6 (six) hours as needed for moderate pain.  60 tablet  0  . Insulin Pen Needle (B-D ULTRAFINE III SHORT PEN) 31G X 8 MM MISC Use to give insulin daily. Dx code: 250.90.  50 each  12  . levalbuterol (XOPENEX HFA) 45  MCG/ACT inhaler Inhale 1-2 puffs into the lungs every 4 (four) hours as needed for wheezing.  1 Inhaler  12  . Liraglutide (VICTOZA) 18 MG/3ML SOPN Inject 1.8 mg into the skin daily.  9 pen  3  . LORazepam (ATIVAN) 0.5 MG tablet Take 1 tablet (0.5 mg total) by mouth at bedtime as needed for anxiety. For anxiety  30 tablet  2  . losartan (COZAAR) 25 MG tablet TAKE 1 TABLET (25 MG TOTAL) BY MOUTH DAILY.  30 tablet  2  . metFORMIN (GLUCOPHAGE) 500 MG tablet Take 2 tablets (1,000 mg total) by mouth 2 (two) times daily with a meal.  240 tablet  12  . Multiple Vitamins-Minerals (MEGA MULTIVITAMIN FOR MEN) TABS Take 1 capsule by mouth every morning.  30 tablet  3  . pantoprazole (PROTONIX) 40 MG tablet TAKE 1 TABLET (40 MG TOTAL) BY MOUTH DAILY.  30 tablet  6  . sildenafil (VIAGRA) 100 MG tablet Take 100 mg by mouth daily as needed for erectile dysfunction. For ED      . XARELTO 20 MG TABS tablet TAKE 1 TABLET (20 MG TOTAL) BY MOUTH DAILY WITH SUPPER.  30 tablet  6   No current facility-administered medications for this encounter.    Filed Vitals:   12/20/13 1142  BP: 92/54  Pulse: 55  Weight: 265 lb (120.203 kg)  SpO2: 98%   PHYSICAL EXAM: General:  Well appearing. No resp difficulty HEENT: normal Neck: supple. JVP ~10 with +HJR; Carotids 2+ bilaterally; no bruits. No lymphadenopathy or thryomegaly appreciated. Cor: PMI normal. Regular rate & rhythm. No rubs, gallops or murmurs. Lungs: clear Abdomen: soft, nontender, obese; nondistended. No hepatosplenomegaly. No bruits or masses. Good bowel sounds. Extremities: no cyanosis, clubbing, rash.  Trace ankle edema Neuro: alert & orientedx3, cranial nerves grossly intact. Moves all 4 extremities w/o difficulty. Affect pleasant.  ASSESSMENT & PLAN:  See below for attending thoughts.   Ulla Potash, NP 12/20/2013  Patient seen with NP, agree with the above note.  1. Chronic systolic CHF: Nonischemic cardiomyopathy.  NYHA class III symptoms  with volume overload on exam today.  Etiology uncertain.  LHC with clean coronaries.  Jeremiah Perkins has had atrial flutter with RVR around the time the cardiomyopathy was found.  Therefore, could be tachycardia-mediated CMP.  However, the cardiomyopathy itself also could have triggered the atrial flutter.  Jeremiah Perkins was a binge drinker in the past but not recently.  No family history of cardiomyopathy or sudden death.  - Needs more effective diuresis. Stop Lasix and start torsemide 80 mg daily.  - Stop taking Coreg 12.5 mg once daily.  Will start taking it 6.25 mg bid.  -  Start spironolactone 12.5 mg daily.  - BMET/BNP in 1 week.  - Given low voltage ECG, will send SPEP/UPEP to assess for evidence of amyloidosis.  Jeremiah Perkins also should have a cardiac MRI to assess for infiltrative disease.  - Check HIV.  - Echo in 3 months, if EF still low will need consideration of ICD.  2. Atrial flutter: Paroxysmal.  See discussion above regarding concern for tachy-mediated cardiomyopathy. Jeremiah Perkins is currently in NSR on amiodarone.  - Decrease amiodarone to 200 mg once daily. Check LFTs/TSH with next labs.  Will need yearly eye exam.  - I am going to refer him to EP for consideration of atrial flutter ablation.  - Continue Xarelto.   Marca AnconaDalton Icelyn Navarrete 12/21/2013

## 2013-12-21 ENCOUNTER — Encounter (HOSPITAL_COMMUNITY)
Admission: RE | Admit: 2013-12-21 | Discharge: 2013-12-21 | Disposition: A | Discharge status: Home / Self Care | Payer: BC Managed Care – PPO | Source: Ambulatory Visit | Attending: Cardiology | Admitting: Cardiology

## 2013-12-21 ENCOUNTER — Encounter (HOSPITAL_COMMUNITY): Payer: BC Managed Care – PPO

## 2013-12-21 LAB — GLUCOSE, CAPILLARY: GLUCOSE-CAPILLARY: 97 mg/dL (ref 70–99)

## 2013-12-23 ENCOUNTER — Encounter (HOSPITAL_COMMUNITY): Payer: BC Managed Care – PPO

## 2013-12-23 ENCOUNTER — Encounter (HOSPITAL_COMMUNITY)
Admission: RE | Admit: 2013-12-23 | Discharge: 2013-12-23 | Disposition: A | Discharge status: Home / Self Care | Payer: BC Managed Care – PPO | Source: Ambulatory Visit | Attending: Cardiology | Admitting: Cardiology

## 2013-12-23 LAB — GLUCOSE, CAPILLARY: GLUCOSE-CAPILLARY: 131 mg/dL — AB (ref 70–99)

## 2013-12-26 ENCOUNTER — Encounter (HOSPITAL_COMMUNITY)
Admission: RE | Admit: 2013-12-26 | Discharge: 2013-12-26 | Disposition: A | Discharge status: Home / Self Care | Payer: BC Managed Care – PPO | Source: Ambulatory Visit | Attending: Cardiology | Admitting: Cardiology

## 2013-12-26 DIAGNOSIS — Z8679 Personal history of other diseases of the circulatory system: Secondary | ICD-10-CM | POA: Insufficient documentation

## 2013-12-26 DIAGNOSIS — Z79899 Other long term (current) drug therapy: Secondary | ICD-10-CM | POA: Insufficient documentation

## 2013-12-26 DIAGNOSIS — I10 Essential (primary) hypertension: Secondary | ICD-10-CM | POA: Insufficient documentation

## 2013-12-26 DIAGNOSIS — G4733 Obstructive sleep apnea (adult) (pediatric): Secondary | ICD-10-CM | POA: Insufficient documentation

## 2013-12-26 DIAGNOSIS — Z8249 Family history of ischemic heart disease and other diseases of the circulatory system: Secondary | ICD-10-CM | POA: Insufficient documentation

## 2013-12-26 DIAGNOSIS — I509 Heart failure, unspecified: Secondary | ICD-10-CM | POA: Insufficient documentation

## 2013-12-26 DIAGNOSIS — I5032 Chronic diastolic (congestive) heart failure: Secondary | ICD-10-CM | POA: Insufficient documentation

## 2013-12-26 DIAGNOSIS — I4892 Unspecified atrial flutter: Secondary | ICD-10-CM | POA: Insufficient documentation

## 2013-12-26 DIAGNOSIS — Z5189 Encounter for other specified aftercare: Secondary | ICD-10-CM | POA: Insufficient documentation

## 2013-12-26 DIAGNOSIS — F17200 Nicotine dependence, unspecified, uncomplicated: Secondary | ICD-10-CM | POA: Insufficient documentation

## 2013-12-26 DIAGNOSIS — Z7982 Long term (current) use of aspirin: Secondary | ICD-10-CM | POA: Insufficient documentation

## 2013-12-26 LAB — GLUCOSE, CAPILLARY: Glucose-Capillary: 123 mg/dL — ABNORMAL HIGH (ref 70–99)

## 2013-12-27 ENCOUNTER — Ambulatory Visit: Payer: BC Managed Care – PPO | Admitting: *Deleted

## 2013-12-30 ENCOUNTER — Encounter: Payer: Self-pay | Admitting: Internal Medicine

## 2013-12-30 ENCOUNTER — Ambulatory Visit (INDEPENDENT_AMBULATORY_CARE_PROVIDER_SITE_OTHER): Payer: BC Managed Care – PPO | Admitting: Internal Medicine

## 2013-12-30 VITALS — BP 121/73 | HR 60 | Temp 97.1°F | Ht 67.0 in | Wt 266.3 lb

## 2013-12-30 DIAGNOSIS — N492 Inflammatory disorders of scrotum: Secondary | ICD-10-CM | POA: Insufficient documentation

## 2013-12-30 DIAGNOSIS — E118 Type 2 diabetes mellitus with unspecified complications: Secondary | ICD-10-CM

## 2013-12-30 DIAGNOSIS — I10 Essential (primary) hypertension: Secondary | ICD-10-CM

## 2013-12-30 DIAGNOSIS — E119 Type 2 diabetes mellitus without complications: Secondary | ICD-10-CM

## 2013-12-30 DIAGNOSIS — N498 Inflammatory disorders of other specified male genital organs: Secondary | ICD-10-CM

## 2013-12-30 DIAGNOSIS — I5022 Chronic systolic (congestive) heart failure: Secondary | ICD-10-CM

## 2013-12-30 LAB — GLUCOSE, CAPILLARY: Glucose-Capillary: 107 mg/dL — ABNORMAL HIGH (ref 70–99)

## 2013-12-30 MED ORDER — DOXYCYCLINE HYCLATE 100 MG PO CAPS: 100.0000 mg | ORAL_CAPSULE | Freq: Two times a day (BID) | ORAL | Status: DC

## 2013-12-30 NOTE — Assessment & Plan Note (Signed)
This seems to be a chronic problem for the patient to have superficial skin abscess in folds near scotum. The sites are indurated and several different stages of healing lesions. Some active drainage was able to be expectorated today's visit. Pt refused to have I&D and wanted trial of Abx instead. There was previous US of the area in 9/14 that showed just nondrainable edema. Pt not septic appearing and has no systemic signs of diffuse infection.  -doxycycline for 10 days -sitz baths -maintain good hygiene and DM control -may need f/u imaging if systemic symptoms continue or worsen or grows in size and causes pain

## 2013-12-30 NOTE — Patient Instructions (Signed)

## 2013-12-30 NOTE — Progress Notes (Signed)
Subjective:    Patient ID: Jeremiah Perkins, male    DOB: 1961-01-20, 53 y.o.   MRN: [id number]  HPI  Jeremiah Perkins is a 53 year old man with complicated past medical history as listed below who presents for groin abscess.  For the past 3 weeks pt has had a pimple in his groin that he has been "playing with it." and then eventually opened but has continued to be draining. He is starting to have some nausea/vomiting and diarrhea that started this week and he is concerned about food poisoning. It was draining some yellow fluid mixed with blood but pt has not had any fevers. He states this has been long standing problem and had several pimples in this area before.   Pt has been participating in cardiac rehabilitation and doing fairly well. The patient followed up with Dr. Marca Ancona on 12/20/13  found to be fluid overloaded and therefore patient was discontinued off of Lasix and switched to torsemide 80 mg daily and spironolactone 12.5 mg daily. There is some concern for infiltrative cardiomyopathy therefore cardiac MRI, SPEP UPEP, HIV were sent the labs are still pending at this time. In terms of his a flutter it was recommended that since the patient continues to be normal sinus rhythm since his cardioversion to decrease amiodarone to 55 mg daily and continues also with EP referral for possible ablation. The patient reports compliance with these new changes.   Review of Systems  Constitutional: Positive for fatigue. Negative for fever, chills, activity change and unexpected weight change.  Eyes: Negative for visual disturbance.  Respiratory: Negative for cough and shortness of breath.   Cardiovascular: Negative for chest pain, palpitations and leg swelling.  Gastrointestinal: Positive for nausea, vomiting and diarrhea. Negative for abdominal pain.  Genitourinary: Positive for scrotal swelling. Negative for dysuria, urgency, hematuria, discharge, penile swelling, penile pain and testicular  pain.  Skin: Negative for rash and wound.       Draining scrotal mass on left side for past 2 weeks with yellow fluid and blood    Past Medical History  Diagnosis Date  . Hypertension   . Congestive heart failure     No echo data available. Myocardial perfusion 2005 with EF 61%, presumed diastolic HF   . Diverticulosis     with history of diverticulitis in 10/2011  . Gout   . Major depression   . Anxiety   . Psychosexual dysfunction with inhibited sexual excitement   . Hyperlipidemia   . Atrial flutter   . Morbid obesity   . Diabetic peripheral neuropathy   . Type II diabetes mellitus dx'd 1991    previously on insulin in 2000 for 3-4 years (but was stopped because adamantly did not want to be on insulin)  . Chronic bronchitis     "get it q yr" (07/26/2013)  . Shortness of breath     "all the time" (07/26/2013)  . OSA (obstructive sleep apnea) 1998    previously on CPAP - unable to tolerate; "going to purchase one next week" (07/26/2013)  . Arthritis     involving knees, ankles, back, elbows  . Chronic back pain   . GERD (gastroesophageal reflux disease)   . Asthma   . Pneumonia    Social, surgical, family history reviewed with patient and updated in appropriate chart locations.      Objective:   Physical Exam  Filed Vitals:   12/30/13 1414  BP: 121/73  Pulse: 60  Temp: 97.1 F (36.2 C)  General: sitting in chair, NAD HEENT: PERRL, EOMI, no scleral icterus Cardiac: RRR, no rubs, murmurs or gallops Pulm: clear to auscultation bilaterally, moving normal volumes of air Abd: soft, nontender, nondistended, BS present Ext: warm and well perfused, no pedal edema, under left scrotum hard indurated 3x3cm with open draining pus and blood site with 2 other areas of well healing indurated tissue, no other lesions or rashes, no scrotal masses or penile drainage  Neuro: alert and oriented X3, cranial nerves II-XII grossly intact    Assessment & Plan:  Please see problem oriented  charting  Pt was seen and discussed with Dr. Kem KaysPaya

## 2013-12-30 NOTE — Assessment & Plan Note (Signed)
Pt was seen in HF clinic on 12/20/13 with medication changes that the patient has been compliant with since that time. Pt is maintaining stable weight and participating in cardiac rehab.  Wt Readings from Last 3 Encounters:  12/30/13 266 lb 4.8 oz (120.793 kg)  12/20/13 265 lb (120.203 kg)  11/11/13 265 lb 11.2 oz (120.521 kg)   -continue current medications -continue Cardiac rehab -cardiac mri, spep/upep, and HIV labs pending per infiltrative cardiomyopathy workup

## 2013-12-30 NOTE — Assessment & Plan Note (Addendum)
Pt with recent down tirtration of metformin given excellent Hgba1c. Pt has tolerated this well but didn't bring in CBG readings.  -cont current treatment

## 2013-12-30 NOTE — Assessment & Plan Note (Signed)
BP Readings from Last 3 Encounters:  12/30/13 121/73  12/20/13 92/54  11/11/13 113/60   With current switch of . Will continue

## 2014-01-02 ENCOUNTER — Encounter (HOSPITAL_COMMUNITY)
Admission: RE | Admit: 2014-01-02 | Discharge: 2014-01-02 | Disposition: A | Discharge status: Home / Self Care | Payer: BC Managed Care – PPO | Source: Ambulatory Visit | Attending: Cardiology | Admitting: Cardiology

## 2014-01-02 LAB — GLUCOSE, CAPILLARY: GLUCOSE-CAPILLARY: 139 mg/dL — AB (ref 70–99)

## 2014-01-03 ENCOUNTER — Other Ambulatory Visit: Payer: BC Managed Care – PPO

## 2014-01-04 NOTE — Progress Notes (Signed)
Case discussed with Dr. Sadek at time of visit.  We reviewed the resident's history and exam and pertinent patient test results.  I agree with the assessment, diagnosis, and plan of care documented in the resident's note. 

## 2014-01-05 ENCOUNTER — Telehealth: Payer: Self-pay | Admitting: *Deleted

## 2014-01-05 MED ORDER — CYCLOBENZAPRINE HCL 5 MG PO TABS: 5.0000 mg | ORAL_TABLET | Freq: Three times a day (TID) | ORAL | Status: DC | PRN

## 2014-01-05 NOTE — Telephone Encounter (Signed)
Call from pt - stated doctor suppose to call in rx for Flexeril for muscle spasms. Thanks

## 2014-01-05 NOTE — Telephone Encounter (Signed)
Pt informed to Flexeril rx.

## 2014-01-05 NOTE — Telephone Encounter (Signed)
[facility]   thanks

## 2014-01-06 ENCOUNTER — Institutional Professional Consult (permissible substitution): Payer: BC Managed Care – PPO | Admitting: Internal Medicine

## 2014-01-06 ENCOUNTER — Telehealth (HOSPITAL_COMMUNITY): Payer: Self-pay | Admitting: *Deleted

## 2014-01-06 DIAGNOSIS — I5022 Chronic systolic (congestive) heart failure: Secondary | ICD-10-CM

## 2014-01-06 NOTE — Telephone Encounter (Signed)
Per Dr Shirlee Latch he would like pt to have a cardiac MRI to look for infiltrative disease, pt is aware, he was sch to see [place] back on 2/18, pt aware and 2/18 appt cx'd will resch once MRI is sch so we will have results, will get MRI approved from [facility] and sch

## 2014-01-10 ENCOUNTER — Other Ambulatory Visit: Payer: BC Managed Care – PPO

## 2014-01-11 ENCOUNTER — Encounter (HOSPITAL_COMMUNITY): Payer: BC Managed Care – PPO

## 2014-01-12 ENCOUNTER — Ambulatory Visit: Payer: BC Managed Care – PPO | Admitting: Cardiology

## 2014-01-16 ENCOUNTER — Encounter (HOSPITAL_COMMUNITY)
Admission: RE | Admit: 2014-01-16 | Discharge: 2014-01-16 | Disposition: A | Discharge status: Home / Self Care | Payer: BC Managed Care – PPO | Source: Ambulatory Visit | Attending: Cardiology | Admitting: Cardiology

## 2014-01-16 LAB — GLUCOSE, CAPILLARY: Glucose-Capillary: 178 mg/dL — ABNORMAL HIGH (ref 70–99)

## 2014-01-18 ENCOUNTER — Encounter (HOSPITAL_COMMUNITY)
Admission: RE | Admit: 2014-01-18 | Discharge: 2014-01-18 | Disposition: A | Discharge status: Home / Self Care | Payer: BC Managed Care – PPO | Source: Ambulatory Visit | Attending: Cardiology | Admitting: Cardiology

## 2014-01-18 LAB — GLUCOSE, CAPILLARY: Glucose-Capillary: 130 mg/dL — ABNORMAL HIGH (ref 70–99)

## 2014-01-20 ENCOUNTER — Encounter (HOSPITAL_COMMUNITY)
Admission: RE | Admit: 2014-01-20 | Discharge: 2014-01-20 | Disposition: A | Discharge status: Home / Self Care | Payer: BC Managed Care – PPO | Source: Ambulatory Visit | Attending: Cardiology | Admitting: Cardiology

## 2014-01-20 ENCOUNTER — Telehealth: Payer: Self-pay | Admitting: Cardiology

## 2014-01-20 DIAGNOSIS — F528 Other sexual dysfunction not due to a substance or known physiological condition: Secondary | ICD-10-CM

## 2014-01-20 LAB — GLUCOSE, CAPILLARY
Glucose-Capillary: 133 mg/dL — ABNORMAL HIGH (ref 70–99)
Glucose-Capillary: 114 mg/dL — ABNORMAL HIGH (ref 70–99)

## 2014-01-20 NOTE — Telephone Encounter (Signed)
Will forward to refills 

## 2014-01-20 NOTE — Telephone Encounter (Signed)
New problem    Pt need to re-order his Viagra please send it in.  Please call pt if you have anying questions.

## 2014-01-23 ENCOUNTER — Telehealth (HOSPITAL_COMMUNITY): Payer: Self-pay | Admitting: *Deleted

## 2014-01-23 ENCOUNTER — Other Ambulatory Visit: Payer: Self-pay | Admitting: *Deleted

## 2014-01-23 ENCOUNTER — Encounter (HOSPITAL_COMMUNITY)
Admission: RE | Admit: 2014-01-23 | Discharge: 2014-01-23 | Disposition: A | Discharge status: Home / Self Care | Payer: BC Managed Care – PPO | Source: Ambulatory Visit | Attending: Cardiology | Admitting: Cardiology

## 2014-01-23 DIAGNOSIS — I5032 Chronic diastolic (congestive) heart failure: Secondary | ICD-10-CM | POA: Insufficient documentation

## 2014-01-23 DIAGNOSIS — I10 Essential (primary) hypertension: Secondary | ICD-10-CM | POA: Insufficient documentation

## 2014-01-23 DIAGNOSIS — G4733 Obstructive sleep apnea (adult) (pediatric): Secondary | ICD-10-CM | POA: Insufficient documentation

## 2014-01-23 DIAGNOSIS — F17200 Nicotine dependence, unspecified, uncomplicated: Secondary | ICD-10-CM | POA: Insufficient documentation

## 2014-01-23 DIAGNOSIS — I509 Heart failure, unspecified: Secondary | ICD-10-CM | POA: Insufficient documentation

## 2014-01-23 DIAGNOSIS — Z8679 Personal history of other diseases of the circulatory system: Secondary | ICD-10-CM | POA: Insufficient documentation

## 2014-01-23 DIAGNOSIS — I4892 Unspecified atrial flutter: Secondary | ICD-10-CM | POA: Insufficient documentation

## 2014-01-23 DIAGNOSIS — Z79899 Other long term (current) drug therapy: Secondary | ICD-10-CM | POA: Insufficient documentation

## 2014-01-23 DIAGNOSIS — Z8249 Family history of ischemic heart disease and other diseases of the circulatory system: Secondary | ICD-10-CM | POA: Insufficient documentation

## 2014-01-23 DIAGNOSIS — Z5189 Encounter for other specified aftercare: Secondary | ICD-10-CM | POA: Insufficient documentation

## 2014-01-23 DIAGNOSIS — Z7982 Long term (current) use of aspirin: Secondary | ICD-10-CM | POA: Insufficient documentation

## 2014-01-23 DIAGNOSIS — M25561 Pain in right knee: Secondary | ICD-10-CM

## 2014-01-23 LAB — GLUCOSE, CAPILLARY
Glucose-Capillary: 122 mg/dL — ABNORMAL HIGH (ref 70–99)
Glucose-Capillary: 175 mg/dL — ABNORMAL HIGH (ref 70–99)

## 2014-01-23 MED ORDER — HYDROCODONE-ACETAMINOPHEN 5-325 MG PO TABS: 1.0000 | ORAL_TABLET | Freq: Four times a day (QID) | ORAL | Status: DC | PRN

## 2014-01-23 MED ORDER — SILDENAFIL CITRATE 100 MG PO TABS: 100.0000 mg | ORAL_TABLET | Freq: Every day | ORAL | Status: DC | PRN

## 2014-01-23 NOTE — Progress Notes (Signed)
Pt weight elevated 1.4kg at cardiac rehab today. Pt reports his home weight is up 5lb overnight this am.  Pt c/o orthopnea with wheezing this am, relieved now.  Otherwise pt reports dyspnea same as usual, no pedal edema.  Lungs clear.  Pt does admit to eating popcorn and V8 juice yesterday.  Phone call to [facility] at Heart Failure clinic.  Pt instructed per Herbert Seta to take extra torsemide 80mg  today.  Call CHF clinic for continued weight gain. Understanding verbalized

## 2014-01-23 NOTE — Telephone Encounter (Signed)
Follow up     Pt want his viagra refilled.  Will not leave a msg on refill vm.

## 2014-01-23 NOTE — Telephone Encounter (Signed)
Received call from cardiac rehab regarding pt's wt, they state it is up a couple of lbs there and pt stated it was up 5 lbs today at home from yesterday, denies SOB or edema but does state he ate extra salt yesterday, pt is taking Torsemide 80 mg daily, he will take extra 80 mg today and let us know if wt does not come back down or if he develops symptoms

## 2014-01-24 ENCOUNTER — Telehealth: Payer: Self-pay | Admitting: *Deleted

## 2014-01-24 ENCOUNTER — Telehealth (HOSPITAL_COMMUNITY): Payer: Self-pay | Admitting: Cardiac Rehabilitation

## 2014-01-24 DIAGNOSIS — F528 Other sexual dysfunction not due to a substance or known physiological condition: Secondary | ICD-10-CM

## 2014-01-24 MED ORDER — SILDENAFIL CITRATE 100 MG PO TABS: 100.0000 mg | ORAL_TABLET | Freq: Every day | ORAL | Status: DC | PRN

## 2014-01-24 NOTE — Telephone Encounter (Signed)
Garment/textile technologist for [employer] [phone] Patient id # [id number] Order number today [id number]

## 2014-01-24 NOTE — Addendum Note (Signed)
Addended by: Carmela Hurt on: 01/24/2014 04:21 PM   Modules accepted: Orders

## 2014-01-24 NOTE — Telephone Encounter (Signed)
pc to pt to assess home weight and symptoms today. Pt states he did not weigh as usual this am, however weighed after he was fully clothed with no change in weight.  Pt states his symptoms have improved with no dyspnea today.  Pt instructed in proper daily weight technique and to check AM weight on home scales prior to dressing.   Pt verbalized understanding

## 2014-01-25 ENCOUNTER — Encounter (HOSPITAL_COMMUNITY)
Admission: RE | Admit: 2014-01-25 | Discharge: 2014-01-25 | Disposition: A | Discharge status: Home / Self Care | Payer: BC Managed Care – PPO | Source: Ambulatory Visit | Attending: Cardiology | Admitting: Cardiology

## 2014-01-25 ENCOUNTER — Other Ambulatory Visit: Payer: Self-pay | Admitting: *Deleted

## 2014-01-25 DIAGNOSIS — F332 Major depressive disorder, recurrent severe without psychotic features: Secondary | ICD-10-CM

## 2014-01-25 LAB — GLUCOSE, CAPILLARY: Glucose-Capillary: 134 mg/dL — ABNORMAL HIGH (ref 70–99)

## 2014-01-26 MED ORDER — BUPROPION HCL ER (XL) 150 MG PO TB24: 150.0000 mg | ORAL_TABLET | ORAL | Status: DC

## 2014-01-27 ENCOUNTER — Telehealth (HOSPITAL_COMMUNITY): Payer: Self-pay | Admitting: *Deleted

## 2014-01-27 ENCOUNTER — Encounter (HOSPITAL_COMMUNITY)
Admission: RE | Admit: 2014-01-27 | Discharge: 2014-01-27 | Disposition: A | Discharge status: Home / Self Care | Payer: BC Managed Care – PPO | Source: Ambulatory Visit | Attending: Cardiology | Admitting: Cardiology

## 2014-01-27 NOTE — Progress Notes (Signed)
Pt no show for his last day of exercise in the cardiac rehab program phase II program. Pt complained 27 exercise sessions in 18 week period.   Pt phoned and completed medication reconciliation.  Pt plans to continue his home exercise at the local gym where he is currently exercising on his off days from rehab.  Encouraged pt to remain compliant with his appointments and remain open to suggestions that may increase his quality of life.  Pt has benefited from being a part of the [facility].  Pt has appt next week with the EP Md to talk about ablation for the aflutter.  Pt met his short term goal to make it.  Pt was unsure if he would be able to exercise at all.  Pt met this goal by attending 27 exercise sessions.  Pt long term goal to get healthier is a partial met.  Pt at times makes good heart healthy food choices that are low sodium but other times picks foods that are not healthy for him to consume.  Pt participates regularly at [employer] and hopes to increase his frequency.  PHQ2 score - 1.  Pt plans to bring his homework by and drop off the parking badge one day next week.  Cherre Huger, BSN

## 2014-01-30 ENCOUNTER — Other Ambulatory Visit: Payer: BC Managed Care – PPO

## 2014-01-31 ENCOUNTER — Ambulatory Visit (INDEPENDENT_AMBULATORY_CARE_PROVIDER_SITE_OTHER): Payer: BC Managed Care – PPO | Admitting: *Deleted

## 2014-01-31 ENCOUNTER — Encounter: Payer: Self-pay | Admitting: Internal Medicine

## 2014-01-31 ENCOUNTER — Other Ambulatory Visit: Payer: Self-pay | Admitting: *Deleted

## 2014-01-31 DIAGNOSIS — I4892 Unspecified atrial flutter: Secondary | ICD-10-CM

## 2014-01-31 LAB — HEPATIC FUNCTION PANEL
ALK PHOS: 71 U/L (ref 39–117)
ALT: 24 U/L (ref 0–53)
AST: 31 U/L (ref 0–37)
Albumin: 3.9 g/dL (ref 3.5–5.2)
Bilirubin, Direct: 0 mg/dL (ref 0.0–0.3)
Total Bilirubin: 0.5 mg/dL (ref 0.3–1.2)
Total Protein: 8.1 g/dL (ref 6.0–8.3)

## 2014-01-31 LAB — BRAIN NATRIURETIC PEPTIDE: Pro B Natriuretic peptide (BNP): 54 pg/mL (ref 0.0–100.0)

## 2014-01-31 LAB — BASIC METABOLIC PANEL
BUN: 42 mg/dL — ABNORMAL HIGH (ref 6–23)
CO2: 27 meq/L (ref 19–32)
Calcium: 9.8 mg/dL (ref 8.4–10.5)
Chloride: 101 mEq/L (ref 96–112)
Creatinine, Ser: 1.6 mg/dL — ABNORMAL HIGH (ref 0.4–1.5)
GFR: 56.47 mL/min — ABNORMAL LOW (ref >60.00)
Glucose, Bld: 122 mg/dL — ABNORMAL HIGH (ref 70–99)
Potassium: 3.8 mEq/L (ref 3.5–5.1)
SODIUM: 139 meq/L (ref 135–145)

## 2014-01-31 LAB — TSH: TSH: 3.76 u[IU]/mL (ref 0.35–5.50)

## 2014-01-31 MED ORDER — GLUCOSE BLOOD VI STRP: ORAL_STRIP | Status: DC

## 2014-01-31 NOTE — Addendum Note (Signed)
Addended by: Noralee Space on: 01/31/2014 12:16 PM   Modules accepted: Orders

## 2014-02-01 ENCOUNTER — Telehealth: Payer: Self-pay | Admitting: *Deleted

## 2014-02-01 LAB — HIV ANTIBODY (ROUTINE TESTING W REFLEX): HIV: NONREACTIVE

## 2014-02-01 NOTE — Telephone Encounter (Signed)
Viagra arrived from the company. He is aware that it will be at the front desk for pick up.

## 2014-02-02 LAB — PROTEIN ELECTROPHORESIS, URINE REFLEX: Total Protein, Urine: <3 mg/dL

## 2014-02-03 ENCOUNTER — Ambulatory Visit (INDEPENDENT_AMBULATORY_CARE_PROVIDER_SITE_OTHER): Payer: BC Managed Care – PPO | Admitting: Internal Medicine

## 2014-02-03 ENCOUNTER — Encounter: Payer: Self-pay | Admitting: Internal Medicine

## 2014-02-03 VITALS — BP 109/68 | HR 65 | Ht 67.0 in | Wt 272.0 lb

## 2014-02-03 DIAGNOSIS — I4892 Unspecified atrial flutter: Secondary | ICD-10-CM

## 2014-02-03 DIAGNOSIS — N189 Chronic kidney disease, unspecified: Secondary | ICD-10-CM

## 2014-02-03 DIAGNOSIS — I5022 Chronic systolic (congestive) heart failure: Secondary | ICD-10-CM

## 2014-02-03 DIAGNOSIS — N179 Acute kidney failure, unspecified: Secondary | ICD-10-CM

## 2014-02-03 DIAGNOSIS — I428 Other cardiomyopathies: Secondary | ICD-10-CM

## 2014-02-03 LAB — PROTEIN ELECTROPHORESIS, SERUM
Albumin ELP: 51.7 % — ABNORMAL LOW (ref 55.8–66.1)
Alpha-1-Globulin: 5.5 % — ABNORMAL HIGH (ref 2.9–4.9)
Alpha-2-Globulin: 13.7 % — ABNORMAL HIGH (ref 7.1–11.8)
BETA 2: 5.3 % (ref 3.2–6.5)
Beta Globulin: 7.9 % — ABNORMAL HIGH (ref 4.7–7.2)
Gamma Globulin: 15.9 % (ref 11.1–18.8)
Total Protein, Serum Electrophoresis: 7.4 g/dL (ref 6.0–8.3)

## 2014-02-03 MED ORDER — TORSEMIDE 20 MG PO TABS: 40.0000 mg | ORAL_TABLET | Freq: Two times a day (BID) | ORAL | Status: DC

## 2014-02-03 NOTE — Assessment & Plan Note (Signed)
I have reviewed 3 different ECG's of his atrial flutter. All look to me like left atrial flutter. I would not recommend catheter ablation. He will need to stay on amiodarone.

## 2014-02-03 NOTE — Progress Notes (Signed)
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 He was placed on Amiodarone several months ago and underwent DCCV in January. He appears to have maintained NSR since then and his CHF symptoms have improved. His EF was 30% by echo. A repeat echo in NSR has not been obtained. Review of his labs reveals that his creatinine is up. Allergies  Allergen Reactions  . Eggs Or Egg-Derived Products Nausea And Vomiting    Did ok with flu shot, seems to be an issue with the type of preparation of egg product.  . Sulfa Antibiotics Nausea And Vomiting        Past Medical History  Diagnosis Date  . Hypertension   . Congestive heart failure     No echo data available. Myocardial perfusion 2005 with EF 61%, presumed diastolic HF   . Diverticulosis     with history of diverticulitis in 10/2011  . Gout   . Major depression   . Anxiety   . Psychosexual dysfunction with inhibited sexual excitement   . Hyperlipidemia   . Atrial flutter   . Morbid obesity   . Diabetic peripheral neuropathy   . Type II diabetes mellitus dx'd 1991    previously on insulin in 2000 for 3-4 years (but was stopped because adamantly did not want to be on insulin)  . Chronic bronchitis     "get it q yr" (07/26/2013)  . Shortness of breath     "all the time" (07/26/2013)  . OSA (obstructive sleep apnea) 1998    previously on CPAP - unable to tolerate; "going to purchase one next week" (07/26/2013)  . Arthritis     involving knees, ankles, back, elbows  . Chronic back pain   . GERD (gastroesophageal reflux disease)   . Asthma   . Pneumonia     ROS:   All systems reviewed and negative except as noted in the HPI.   Past Surgical History  Procedure Laterality Date  . Ankle reconstruction Right 1990's    2/2 trauma sustained after fall  . 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 Cyst  removal leg Left 1960's    back of left leg  . Cardiac catheterization  1990's  . Tee without cardioversion N/A 07/28/2013    Procedure: TRANSESOPHAGEAL ECHOCARDIOGRAM (TEE);  Surgeon: Laurey Morale, MD;  Location: [facility] ENDOSCOPY;  Service: Cardiovascular;  Laterality: N/A;  . Cardioversion N/A 07/28/2013    Procedure: CARDIOVERSION;  Surgeon: Laurey Morale, MD;  Location: [facility] ENDOSCOPY;  Service: Cardiovascular;  Laterality: N/A;  . Cardioversion N/A 10/26/2013    Procedure: CARDIOVERSION;  Surgeon: Cassell Clement, MD;  Location: [facility] ENDOSCOPY;  Service: Cardiovascular;  Laterality: N/A;     Family History  Problem Relation Age of Onset  . Diabetes Mother   . CAD Mother 59    requiring quadruple bypass  . Hypertension Mother   . Alzheimer's disease Mother   . Stomach cancer Father   . Lung cancer Father     was a smoker  . Gout Father   . CAD Brother 48    requiring quadruple bypass  . Seizures Brother   . Hypertension Brother   . Diabetes Maternal Grandmother   . Alzheimer's disease Maternal Grandmother   . Breast cancer Paternal Aunt   . Diabetes Maternal Aunt   . Seizures Paternal  Uncle      History   Social History  . Marital Status: Married    Spouse Name: N/A    Number of Children: 3  . Years of Education: 14   Occupational History  . Now unemployed     Barrister's clerkAirplane mechanic at [employer]  .  [place]   Social History Main Topics  . Smoking status: Former Smoker -- 0.12 packs/day for 10 years    Types: Cigarettes  . Smokeless tobacco: Never Used     Comment: Smokes 3 cigs per week / previously smoked 1ppd x 1-2 years; "nothing since ~ 2010" ; "chewed tobacco as a kid" (07/26/2013)  . Alcohol Use: Yes     Comment: 07/26/2013 "dron't drink q wk; only on big sporting events, birthdays, weddings, etc"  . Drug Use: Yes    Special: Marijuana     Comment: 07/26/2013 "smoked weed in high school and college; nothing for years"  . Sexual Activity: Yes   Other Topics Concern  .  Not on file   Social History Narrative   Previously worked as Barrister's clerkairplane mechanic and lives at home with his wife. They each have children but none between the two of them.     BP 109/68  Pulse 65  Ht 5\' 7"  (1.702 m)  Wt 272 lb (123.378 kg)  BMI 42.59 kg/m2  Physical Exam:  Obese appearing middle aged man, NAD HEENT: Unremarkable Neck:  No JVD, no thyromegally Lymphatics:  No adenopathy Back:  No CVA tenderness Lungs:  Clear with no wheezes HEART:  Regular rate rhythm, no murmurs, no rubs, no clicks Abd:  soft, positive bowel sounds, no organomegally, no rebound, no guarding Ext:  2 plus pulses, no edema, no cyanosis, no clubbing Skin:  No rashes no nodules Neuro:  CN II through XII intact, motor grossly intact  EKG - NSR   Assess/Plan:

## 2014-02-03 NOTE — Assessment & Plan Note (Signed)
His CHF symptoms have improved. I suspect his EF will have improved. If not, I discussed ICD implant with the patient and he is considering.

## 2014-02-03 NOTE — Patient Instructions (Addendum)
Your physician has recommended you make the following change in your medication:  1) Decrease Torsemide to 40 mg twice a day.  Your physician has requested that you have an echocardiogram in early May. Echocardiography is a painless test that uses sound waves to create images of your heart. It provides your doctor with information about the size and shape of your heart and how well your heart's chambers and valves are working. This procedure takes approximately one hour. There are no restrictions for this procedure.  Your physician recommends that you schedule a follow-up appointment in: May with Dr. Ladona Ridgel.

## 2014-02-03 NOTE — Assessment & Plan Note (Signed)
His creatinine is increased an I have asked that he reduce his dose of diuretic

## 2014-02-15 ENCOUNTER — Ambulatory Visit (INDEPENDENT_AMBULATORY_CARE_PROVIDER_SITE_OTHER): Payer: BC Managed Care – PPO | Admitting: Cardiology

## 2014-02-15 ENCOUNTER — Telehealth: Payer: Self-pay | Admitting: *Deleted

## 2014-02-15 ENCOUNTER — Ambulatory Visit (HOSPITAL_COMMUNITY): Payer: BC Managed Care – PPO

## 2014-02-15 ENCOUNTER — Encounter: Payer: Self-pay | Admitting: Cardiology

## 2014-02-15 VITALS — BP 118/66 | HR 87 | Ht 67.0 in | Wt 269.0 lb

## 2014-02-15 DIAGNOSIS — N179 Acute kidney failure, unspecified: Secondary | ICD-10-CM

## 2014-02-15 DIAGNOSIS — I428 Other cardiomyopathies: Secondary | ICD-10-CM

## 2014-02-15 DIAGNOSIS — I5022 Chronic systolic (congestive) heart failure: Secondary | ICD-10-CM

## 2014-02-15 DIAGNOSIS — I4892 Unspecified atrial flutter: Secondary | ICD-10-CM

## 2014-02-15 DIAGNOSIS — Z79899 Other long term (current) drug therapy: Secondary | ICD-10-CM

## 2014-02-15 DIAGNOSIS — N189 Chronic kidney disease, unspecified: Secondary | ICD-10-CM

## 2014-02-15 DIAGNOSIS — E1142 Type 2 diabetes mellitus with diabetic polyneuropathy: Secondary | ICD-10-CM

## 2014-02-15 DIAGNOSIS — E1149 Type 2 diabetes mellitus with other diabetic neurological complication: Secondary | ICD-10-CM

## 2014-02-15 LAB — BASIC METABOLIC PANEL
BUN: 29 mg/dL — AB (ref 6–23)
CHLORIDE: 99 meq/L (ref 96–112)
CO2: 25 mEq/L (ref 19–32)
CREATININE: 1.5 mg/dL (ref 0.4–1.5)
Calcium: 9.5 mg/dL (ref 8.4–10.5)
GFR: 62.11 mL/min (ref >60.00)
Glucose, Bld: 102 mg/dL — ABNORMAL HIGH (ref 70–99)
Potassium: 4.5 mEq/L (ref 3.5–5.1)
Sodium: 136 mEq/L (ref 135–145)

## 2014-02-15 NOTE — Telephone Encounter (Signed)
[employer] reorder for [facility] [phone]  Patient id # [id number] ORDER # [id number], will process 03/28/14

## 2014-02-15 NOTE — Assessment & Plan Note (Signed)
The patient is on gabapentin for his neuropathy.  Gabapentin appears to be helping partially.

## 2014-02-15 NOTE — Assessment & Plan Note (Signed)
The patient has had less exertional dyspnea.  He recently saw Dr. Ladona Ridgel who has arranged for the patient to have a followup echo in may 2015 to look at his left ventricular function now that he is back in normal sinus rhythm.  The patient states that Dr. Ladona Ridgel talked to him about a defibrillator.  The patient has not decided whether he wants one or not.

## 2014-02-15 NOTE — Assessment & Plan Note (Signed)
The patient is maintaining normal sinus rhythm on amiodarone 200 mg daily.  The patient is on long-term Xarelto.  We are checking a basal metabolic panel today to be sure that his dose is appropriate for his renal function.

## 2014-02-15 NOTE — Progress Notes (Signed)
Jeremiah Perkins Date of Birth:  [birth year] [address] [place], [place]  [address] [phone]         Fax   [phone]  History of Present Illness: This pleasant 53 year old African American gentleman is seen for a post hospital followup office visit.  The patient underwent elective outpatient cardioversion on 10/26/13.  He has remained in normal sinus rhythm. .  He has a history of a nonischemic cardiomyopathy with previous catheterization showing no significant obstructive disease.  His ejection fraction is estimated at 20-25%.    He is diuresing on his current outpatient regimen.  He still notes significant dyspnea with minimal exertion.  Marland Kitchen  He does have occasional chest discomfort.  The patient also has a history of diabetes mellitus type 2 which is followed at the internal medicine clinic.  He has a history of obstructive sleep apnea and morbid obesity and diabetic peripheral neuropathy.  He has a past history of hypertension and gout.  He has a past history of peripheral neuropathy and is on gabapentin The patient has finished the the cardiac rehabilitation program.  He elected not to go into the maintenance phase because it would have been expensive for him.  So far he is tolerating exercise without adverse effect.  Current Outpatient Prescriptions  Medication Sig Dispense Refill  . allopurinol (ZYLOPRIM) 300 MG tablet Take 1 tablet (300 mg total) by mouth daily.  30 tablet  3  . amiodarone (PACERONE) 200 MG tablet Take 1 tablet (200 mg total) by mouth daily.  30 tablet  3  . atorvastatin (LIPITOR) 40 MG tablet Take 1 tablet (40 mg total) by mouth daily.  30 tablet  0  . carvedilol (COREG) 6.25 MG tablet Take 1 tablet (6.25 mg total) by mouth 2 (two) times daily with a meal.  60 tablet  3  . colchicine 0.6 MG tablet Take 1 tablet (0.6 mg total) by mouth daily.  30 tablet  2  . cyclobenzaprine (FLEXERIL) 5 MG tablet Take 1 tablet (5 mg total) by mouth every 8  (eight) hours as needed for muscle spasms.  30 tablet  1  . docusate sodium (COLACE) 100 MG capsule Take 100 mg by mouth daily as needed for mild constipation. For constipation      . gabapentin (NEURONTIN) 600 MG tablet Takes 600mg  in the am & 1200mg  in the pm      . HYDROcodone-acetaminophen (NORCO/VICODIN) 5-325 MG per tablet Take 1-2 tablets by mouth every 6 (six) hours as needed for moderate pain.  60 tablet  0  . levalbuterol (XOPENEX HFA) 45 MCG/ACT inhaler Inhale 1-2 puffs into the lungs every 4 (four) hours as needed for wheezing.  1 Inhaler  12  . Liraglutide (VICTOZA) 18 MG/3ML SOPN Inject 1.8 mg into the skin daily.  9 pen  3  . LORazepam (ATIVAN) 0.5 MG tablet Take 1 tablet (0.5 mg total) by mouth at bedtime as needed for anxiety. For anxiety  30 tablet  2  . losartan (COZAAR) 25 MG tablet TAKE 1 TABLET (25 MG TOTAL) BY MOUTH DAILY.  30 tablet  2  . metFORMIN (GLUCOPHAGE) 500 MG tablet Take 2 tablets (1,000 mg total) by mouth 2 (two) times daily with a meal.  240 tablet  12  . Multiple Vitamins-Minerals (MEGA MULTIVITAMIN FOR MEN) TABS Take 1 capsule by mouth every morning.  30 tablet  3  . sildenafil (VIAGRA) 100 MG tablet Take 1 tablet (100 mg  total) by mouth daily as needed for erectile dysfunction PATIENT GETS THIS MEDICATIONS FROM [facility], SEE NOTE 01/24/2014  10 tablet  3  . spironolactone (ALDACTONE) 25 MG tablet Take 0.5 tablets (12.5 mg total) by mouth daily.  15 tablet  3  . torsemide (DEMADEX) 20 MG tablet Take 2 tablets (40 mg total) by mouth 2 (two) times daily.  60 tablet  1  . XARELTO 20 MG TABS tablet TAKE 1 TABLET (20 MG TOTAL) BY MOUTH DAILY WITH SUPPER.  30 tablet  6   No current facility-administered medications for this visit.    Allergies  Allergen Reactions  . Eggs Or Egg-Derived Products Nausea And Vomiting    Did ok with flu shot, seems to be an issue with the type of preparation of egg product.  . Sulfa Antibiotics Nausea And Vomiting    Patient Active  Problem List   Diagnosis Date Noted  . Acute on chronic renal failure 02/03/2014  . Scrotal abscess 12/30/2013  . Healthcare maintenance 10/07/2013  . Long term (current) use of anticoagulants 07/22/2013  . Atrial flutter 07/15/2013  . Morbid obesity 11/11/2012  . Diabetes mellitus type 2, controlled, with complications   . Arthritis   . Major depression   . Diabetic peripheral neuropathy   . Anxiety   . Psychosexual dysfunction with inhibited sexual excitement   . OSA (obstructive sleep apnea)   . Hypertension   . Chronic systolic heart failure   . Gout     History  Smoking status  . Former Smoker -- 0.12 packs/day for 10 years  . Types: Cigarettes  Smokeless tobacco  . Never Used    Comment: Smokes 3 cigs per week / previously smoked 1ppd x 1-2 years; "nothing since ~ 2010" ; "chewed tobacco as a kid" (07/26/2013)    History  Alcohol Use  . Yes    Comment: 07/26/2013 "dron't drink q wk; only on big sporting events, birthdays, weddings, etc"    Family History  Problem Relation Age of Onset  . Diabetes Mother   . CAD Mother 87    requiring quadruple bypass  . Hypertension Mother   . Alzheimer's disease Mother   . Stomach cancer Father   . Lung cancer Father     was a smoker  . Gout Father   . CAD Brother 48    requiring quadruple bypass  . Seizures Brother   . Hypertension Brother   . Diabetes Maternal Grandmother   . Alzheimer's disease Maternal Grandmother   . Breast cancer Paternal Aunt   . Diabetes Maternal Aunt   . Seizures Paternal Uncle     Review of Systems: Constitutional: no fever chills diaphoresis or fatigue or change in weight.  Head and neck: no hearing loss, no epistaxis, no photophobia or visual disturbance. Respiratory: No cough, shortness of breath or wheezing. Cardiovascular: No chest pain peripheral edema, palpitations. Gastrointestinal: No abdominal distention, no abdominal pain, no change in bowel habits hematochezia or  melena. Genitourinary: No dysuria, no frequency, no urgency, no nocturia. Musculoskeletal:No arthralgias, no back pain, no gait disturbance or myalgias. Neurological: No dizziness, no headaches, no numbness, no seizures, no syncope, no weakness, no tremors. Hematologic: No lymphadenopathy, no easy bruising. Psychiatric: No confusion, no hallucinations, no sleep disturbance.    Physical Exam: Filed Vitals:   02/15/14 1023  BP: 118/66  Pulse: 87   the general appearance reveals a large gentleman in no distress.The head and neck exam reveals pupils equal and reactive.  Extraocular  movements are full.  There is no scleral icterus.  The mouth and pharynx are normal.  The neck is supple.  The carotids reveal no bruits.  The jugular venous pressure is normal.  The  thyroid is not enlarged.  There is no lymphadenopathy.  The chest is clear to percussion and auscultation.  There are no rales or rhonchi.  Expansion of the chest is symmetrical.  The precordium is quiet.  The first heart sound is normal.  The second heart sound is physiologically split.  There is no murmur gallop rub or click.  There is no abnormal lift or heave.  The abdomen is soft and nontender.  The bowel sounds are normal.  The liver and spleen are not enlarged.  There are no abdominal masses.  There are no abdominal bruits.  Extremities reveal good pedal pulses.  There is trace peripheral edema..  There is no cyanosis or clubbing.  Strength is normal and symmetrical in all extremities.  There is no lateralizing weakness.  There are no sensory deficits.  The skin is warm and dry.  There is no rash.     Assessment / Plan: Continue same medication.  Recheck a basal metabolic panel today to be sure that his creatinine clearance is satisfactory for current dose of Xarelto.  Recheck in 3 months for office visit EKG lipid panel hepatic function panel basal metabolic panel CBC TSH and free T4 to followup on long-term amiodarone.

## 2014-02-15 NOTE — Patient Instructions (Signed)
Will obtain labs today and call you with the results (bmet)  Your physician recommends that you continue on your current medications as directed. Please refer to the Current Medication list given to you today.  Your physician recommends that you schedule a follow-up appointment in: 3 months with fasting labs (LP/BMET/HFP/TSH/FT4/CBC) AND EKG

## 2014-02-16 ENCOUNTER — Encounter: Payer: Self-pay | Admitting: Internal Medicine

## 2014-02-27 ENCOUNTER — Encounter: Payer: Self-pay | Admitting: Internal Medicine

## 2014-02-27 ENCOUNTER — Telehealth: Payer: Self-pay | Admitting: *Deleted

## 2014-02-27 ENCOUNTER — Ambulatory Visit (INDEPENDENT_AMBULATORY_CARE_PROVIDER_SITE_OTHER): Payer: BC Managed Care – PPO | Admitting: Internal Medicine

## 2014-02-27 ENCOUNTER — Ambulatory Visit (HOSPITAL_COMMUNITY)
Admission: RE | Admit: 2014-02-27 | Discharge: 2014-02-27 | Disposition: A | Discharge status: Home / Self Care | Payer: BC Managed Care – PPO | Source: Ambulatory Visit | Attending: Internal Medicine | Admitting: Internal Medicine

## 2014-02-27 VITALS — BP 115/79 | HR 61 | Temp 97.5°F | Wt 273.5 lb

## 2014-02-27 DIAGNOSIS — M545 Low back pain, unspecified: Secondary | ICD-10-CM | POA: Insufficient documentation

## 2014-02-27 DIAGNOSIS — Z9181 History of falling: Secondary | ICD-10-CM

## 2014-02-27 DIAGNOSIS — IMO0001 Reserved for inherently not codable concepts without codable children: Secondary | ICD-10-CM | POA: Insufficient documentation

## 2014-02-27 NOTE — Progress Notes (Signed)
Patient ID: Jeremiah Perkins, male   DOB: 11/06/1961, 53 y.o.   MRN: [id number]  HPI  53 year old african [place] male with multiple medical problems as listed including HTN, DM, Chronic systolic failure 53 comes to the office today with a history of fall 53 about 8 days ago. He reports that he was "pushing a motor cycle" in the garage one day and he lost control of the motor cycle and when the motor cycle was about to fall, he tried to grab it with his right hand when he suddenly had a sharp shooting pain in his right lower back. The same day while he was going to his bedroom (there is a step down into his bedroom), he had severe shooting pain and lost balance and fell onto the coffee table.  He denies any LOC, hitting head to the ground, weakness of extremities, tingling/numbness of extremities, vertigo, dizziness, headache. He denies any chest pain, difficulty breathing. He states that his pain is 8-9/10 in severity, constant, sharp pain, located over the right lower back, not radiating, aggravated by walking, getting up from the sitting position and relieved by topical pressure in the area and using pillows on the affected side. He reports using Vicodin and Flexeril daily without any relief.   He denies any other complaints.    Past Medical History  Diagnosis Date  . Hypertension   . Congestive heart failure     No echo data available. Myocardial perfusion 2005 with EF 61%, presumed diastolic HF   . Diverticulosis     with history of diverticulitis in 10/2011  . Gout   . Major depression   . Anxiety   . Psychosexual dysfunction with inhibited sexual excitement   . Hyperlipidemia   . Atrial flutter   . Morbid obesity   . Diabetic peripheral neuropathy   . Type II diabetes mellitus dx'd 1991    previously on insulin in 2000 for 3-4 years (but was stopped because adamantly did not want to be on insulin)  . Chronic bronchitis     "get it q yr" (07/26/2013)  . Shortness of breath     "all the  time" (07/26/2013)  . OSA (obstructive sleep apnea) 1998    previously on CPAP - unable to tolerate; "going to purchase one next week" (07/26/2013)  . Arthritis     involving knees, ankles, back, elbows  . Chronic back pain   . GERD (gastroesophageal reflux disease)   . Asthma   . Pneumonia     Past Surgical History  Procedure Laterality Date  . Ankle reconstruction Right 1990's    2/2 trauma sustained after fall  . 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 Cyst removal leg Left 1960's    back of left leg  . Cardiac catheterization  1990's  . Tee without cardioversion N/A 07/28/2013    Procedure: TRANSESOPHAGEAL ECHOCARDIOGRAM (TEE);  Surgeon: Laurey Morale, MD;  Location: [facility] ENDOSCOPY;  Service: Cardiovascular;  Laterality: N/A;  . Cardioversion N/A 07/28/2013    Procedure: CARDIOVERSION;  Surgeon: Laurey Morale, MD;  Location: [facility] ENDOSCOPY;  Service: Cardiovascular;  Laterality: N/A;  . Cardioversion N/A 10/26/2013    Procedure: CARDIOVERSION;  Surgeon: Cassell Clement, MD;  Location: [facility] ENDOSCOPY;  Service: Cardiovascular;  Laterality: N/A;    Family History  Problem Relation Age of Onset  . Diabetes Mother   . CAD Mother 1    requiring quadruple bypass  . Hypertension Mother   . Alzheimer's  disease Mother   . Stomach cancer Father   . Lung cancer Father     was a smoker  . Gout Father   . CAD Brother 48    requiring quadruple bypass  . Seizures Brother   . Hypertension Brother   . Diabetes Maternal Grandmother   . Alzheimer's disease Maternal Grandmother   . Breast cancer Paternal Aunt   . Diabetes Maternal Aunt   . Seizures Paternal Uncle     Social History History  Substance Use Topics  . Smoking status: Current Some Day Smoker -- 0.12 packs/day for 10 years    Types: Cigarettes  . Smokeless tobacco: Never Used     Comment: Smokes 3 cigs pers week / previously smoked 1ppd x 1-2 years; "nothing since ~ 2010" ; "chewed tobacco as a kid" (07/26/2013)  .  Alcohol Use: Yes     Comment: 07/26/2013 "dron't drink q wk; only on big sporting events, birthdays, weddings, etc"    Allergies  Allergen Reactions  . Eggs Or Egg-Derived Products Nausea And Vomiting    Did ok with flu shot, seems to be an issue with the type of preparation of egg product.  . Sulfa Antibiotics Nausea And Vomiting    Current Outpatient Prescriptions  Medication Sig Dispense Refill  . allopurinol (ZYLOPRIM) 300 MG tablet Take 1 tablet (300 mg total) by mouth daily.  30 tablet  3  . amiodarone (PACERONE) 200 MG tablet Take 1 tablet (200 mg total) by mouth daily.  30 tablet  3  . atorvastatin (LIPITOR) 40 MG tablet Take 1 tablet (40 mg total) by mouth daily.  30 tablet  0  . carvedilol (COREG) 6.25 MG tablet Take 1 tablet (6.25 mg total) by mouth 2 (two) times daily with a meal.  60 tablet  3  . colchicine 0.6 MG tablet Take 1 tablet (0.6 mg total) by mouth daily.  30 tablet  2  . cyclobenzaprine (FLEXERIL) 5 MG tablet Take 1 tablet (5 mg total) by mouth every 8 (eight) hours as needed for muscle spasms.  30 tablet  1  . docusate sodium (COLACE) 100 MG capsule Take 100 mg by mouth daily as needed for mild constipation. For constipation      . gabapentin (NEURONTIN) 600 MG tablet Takes 600mg  in the am & 1200mg  in the pm      . HYDROcodone-acetaminophen (NORCO/VICODIN) 5-325 MG per tablet Take 1-2 tablets by mouth every 6 (six) hours as needed for moderate pain.  60 tablet  0  . levalbuterol (XOPENEX HFA) 45 MCG/ACT inhaler Inhale 1-2 puffs into the lungs every 4 (four) hours as needed for wheezing.  1 Inhaler  12  . Liraglutide (VICTOZA) 18 MG/3ML SOPN Inject 1.8 mg into the skin daily.  9 pen  3  . LORazepam (ATIVAN) 0.5 MG tablet Take 1 tablet (0.5 mg total) by mouth at bedtime as needed for anxiety. For anxiety  30 tablet  2  . losartan (COZAAR) 25 MG tablet TAKE 1 TABLET (25 MG TOTAL) BY MOUTH DAILY.  30 tablet  2  . metFORMIN (GLUCOPHAGE) 500 MG tablet Take 2 tablets (1,000  mg total) by mouth 2 (two) times daily with a meal.  240 tablet  12  . Multiple Vitamins-Minerals (MEGA MULTIVITAMIN FOR MEN) TABS Take 1 capsule by mouth every morning.  30 tablet  3  . sildenafil (VIAGRA) 100 MG tablet Take 1 tablet (100 mg total) by mouth daily as needed for erectile dysfunction (PATIENT GETS  THIS MEDICATIONS FROM [facility], SEE NOTE 01/24/2014).  10 tablet  3  . spironolactone (ALDACTONE) 25 MG tablet Take 0.5 tablets (12.5 mg total) by mouth daily.  15 tablet  3  . torsemide (DEMADEX) 20 MG tablet Take 2 tablets (40 mg total) by mouth 2 (two) times daily.  60 tablet  1  . XARELTO 20 MG TABS tablet TAKE 1 TABLET (20 MG TOTAL) BY MOUTH DAILY WITH SUPPER.  30 tablet  6   No current facility-administered medications for this visit.      Review of Systems Negative except for HPI.  Blood pressure 115/79, pulse 61, temperature 97.5 F (36.4 C), temperature source Oral, weight 273 lb 8 oz (124.059 kg), SpO2 99.00%.  Physical Exam Physical Exam  Constitutional: He is oriented to person, place, and time and well-developed, well-nourished, and in no distress.  Morbidly obese african american  HENT:  Head: Normocephalic and atraumatic.  Eyes: Pupils are equal, round, and reactive to light.  Neck: Normal range of motion.  Cardiovascular: Normal rate, regular rhythm and normal heart sounds.   Pulmonary/Chest: Effort normal and breath sounds normal.  Abdominal: Soft.  Musculoskeletal:  No visible bruises noted over the right lower back. Mild tenderness to palpation over the right lower back mostly adjacent to the L4/5 area. No midline tenderness over the lumbar or sacral area. ROM in both extremities - normal. Getting up from sitting position resulted in pain in the right lower back. He exhibited well co-ordinated movements without walking aid in "The Get up and Go" test.   Neurological: He is alert and oriented to person, place, and time. He has normal sensation, normal  strength, normal reflexes and intact cranial nerves. Gait normal.  Skin: Skin is warm and dry.   Physical Exam  Constitutional: He is oriented to person, place, and time and well-developed, well-nourished, and in no distress.  Morbidly obese african american  HENT:  Head: Normocephalic and atraumatic.  Eyes: Pupils are equal, round, and reactive to light.  Neck: Normal range of motion.  Cardiovascular: Normal rate, regular rhythm and normal heart sounds.   Pulmonary/Chest: Effort normal and breath sounds normal.  Abdominal: Soft.  Musculoskeletal:  No visible bruises noted over the right lower back. Mild tenderness to palpation over the right lower back mostly adjacent to the L4/5 area. No midline tenderness over the lumbar or sacral area. ROM in both extremities - normal. Getting up from sitting position resulted in pain in the right lower back. He exhibited well co-ordinated movements without walking aid in "The Get up and Go" test.   Neurological: He is alert and oriented to person, place, and time. He has normal sensation, normal strength, normal reflexes and intact cranial nerves. Gait normal.  Skin: Skin is warm and dry.    Assessment  LOWER BACK PAIN:  Most likely secondary to Muscle cramp/spasm. History of Fall 8 days ago (Last week of March 2015). Patient tried to grab the motor cycle while it was falling and developed severe right sided lower back pain. No signs of bony injury on physical examination. Suspect the fall secondary to loss of balance in the setting severe lower back pain. Will get the lumbar, sacral X-rays to rule out the bony injuries. The Get up and Go test for gait assessment reveals that the patient is "no fall risk" as he exhibited well co-ordinated movements with out walking aid.   Plans:  Discussed with the patient regarding polypharmacy including medications such as Vicodin, Flexeril, BZD's  and the risk of fall. Cautioned him about the safety measures for  a fall, as he is on anti-coagulation.  Continue Flexeril and Vicodin as recommended daily until the pain gets better.  Apply/Massage Bengay cream topically over the affected area three times daily.  Apply hot water massage over the affected area three times daily.  Return to the clinic if the pain doesn't resolve in two weeks or worsens.    Dr. Arturo Morton M.D. [phone]

## 2014-02-27 NOTE — Progress Notes (Addendum)
Patient ID: Jeremiah Perkins, male   DOB: 1961-03-05, 53 y.o.   MRN:

## 2014-02-27 NOTE — Progress Notes (Signed)
   Subjective:    Patient ID: Jeremiah Perkins, male    DOB: [birth year], 53 y.o.   MRN: [id number]  Back Pain      Review of Systems  Musculoskeletal: Positive for back pain.       Objective:   Physical Exam        Assessment & Plan:

## 2014-02-27 NOTE — Assessment & Plan Note (Addendum)
Most likely secondary to Muscle cramp/spasm. History of Fall 8 days ago (Last week of March 2015). Patient tried to grab the motor cycle while it was falling and developed severe right sided lower back pain. No signs of bony injury on physical examination. Suspect the fall secondary to loss of balance in the setting severe lower back pain. Will get the lumbar, sacral  X-rays to rule out the bony injuries. The Get up and Go test for gait assessment reveals that the patient is "no fall risk" as he exhibited well co-ordinated movements with out walking aid.   Plans: Discussed with the patient regarding polypharmacy including medications such as Vicodin, Flexeril, BZD's and the risk of fall. Cautioned him about the safety measures for a fall, as he is on anti-coagulation. Continue Flexeril and Vicodin as recommended daily until the pain gets better. Apply/Massage Bengay cream topically over the affected area three times daily. Apply hot water massage over the affected area three times daily.  Return to the clinic if the pain doesn't resolve in two weeks or worsens.

## 2014-02-27 NOTE — Progress Notes (Signed)
Case discussed with Dr. Cecile Sheerer at time of visit.  We reviewed the resident's history and exam and pertinent patient test results.  I agree with the assessment, diagnosis, and plan of care documented in the resident's note.

## 2014-02-27 NOTE — Patient Instructions (Addendum)
Massage Bengay Ultra strength cream three times daily over the right lower back. Take Flexeril and Vicodin as directed. Use warm water massage topically over the right lower back. I will give you a call if there is any abnormality in the X-rays. If the pain persists after 2 weeks or if the pain worsens, please give [place] a call. Continue all the other medications as advised.

## 2014-02-27 NOTE — Telephone Encounter (Signed)
Pt states he fell appr 8 days ago and has pain that is breathtaking on his R side, he has tried several things to help and cannot find anything that does. He is very worried about this. appt 1330 today dr Comer Locket

## 2014-02-28 ENCOUNTER — Other Ambulatory Visit (HOSPITAL_COMMUNITY): Payer: Self-pay | Admitting: Internal Medicine

## 2014-02-28 DIAGNOSIS — I5022 Chronic systolic (congestive) heart failure: Secondary | ICD-10-CM

## 2014-03-02 ENCOUNTER — Other Ambulatory Visit: Payer: Self-pay

## 2014-03-09 ENCOUNTER — Ambulatory Visit (HOSPITAL_COMMUNITY): Admission: RE | Admit: 2014-03-09 | Payer: BC Managed Care – PPO | Source: Ambulatory Visit

## 2014-03-10 ENCOUNTER — Ambulatory Visit (INDEPENDENT_AMBULATORY_CARE_PROVIDER_SITE_OTHER): Payer: BC Managed Care – PPO | Admitting: Internal Medicine

## 2014-03-10 ENCOUNTER — Encounter: Payer: Self-pay | Admitting: Internal Medicine

## 2014-03-10 VITALS — BP 119/76 | HR 74 | Temp 97.9°F | Ht 67.0 in | Wt 268.2 lb

## 2014-03-10 DIAGNOSIS — F32A Depression, unspecified: Secondary | ICD-10-CM

## 2014-03-10 DIAGNOSIS — N498 Inflammatory disorders of other specified male genital organs: Secondary | ICD-10-CM

## 2014-03-10 DIAGNOSIS — M25561 Pain in right knee: Secondary | ICD-10-CM

## 2014-03-10 DIAGNOSIS — M545 Low back pain, unspecified: Secondary | ICD-10-CM

## 2014-03-10 DIAGNOSIS — N492 Inflammatory disorders of scrotum: Secondary | ICD-10-CM

## 2014-03-10 DIAGNOSIS — F329 Major depressive disorder, single episode, unspecified: Secondary | ICD-10-CM

## 2014-03-10 DIAGNOSIS — E118 Type 2 diabetes mellitus with unspecified complications: Secondary | ICD-10-CM

## 2014-03-10 LAB — GLUCOSE, CAPILLARY: Glucose-Capillary: 122 mg/dL — ABNORMAL HIGH (ref 70–99)

## 2014-03-10 LAB — POCT GLYCOSYLATED HEMOGLOBIN (HGB A1C): HEMOGLOBIN A1C: 6.3

## 2014-03-10 MED ORDER — DICLOFENAC SODIUM 1 % TD GEL: 2.0000 g | Freq: Four times a day (QID) | TRANSDERMAL | Status: DC

## 2014-03-10 MED ORDER — HYDROCODONE-ACETAMINOPHEN 5-325 MG PO TABS: 1.0000 | ORAL_TABLET | Freq: Four times a day (QID) | ORAL | Status: DC | PRN

## 2014-03-10 NOTE — Progress Notes (Signed)
Subjective:    Patient ID: Jeremiah Perkins, male    DOB: [birth year], 53 y.o.   MRN: [id number]  HPI Jeremiah Perkins is a 53 yo man pmh as listed below presents for follow up on his back pain and medications before his upcoming cruise.   Pt had recent fall on 02/27/14 and had xrays that were negative for acute fracture but showed some DJD at multiple levels. Pt was continued on flexeril and vicodin. Pt states that it has not helped. He has continued to try and exercise but he is having some twinge like pain. He tried using the hot-tub for some relief that was helpful but then he went into a cold pool and had return of his spasms. He is using a pillow to sleep on at night. He denied any urinary/stool incontinence, shooting pains, numbness or tingling, or lower extremity weakness.   He did have a recent gout flare during this incident but has now fully resolved with no residual symptoms.   The patient is still complaining of some intermittent draining but nothing active now. No systemic symptoms of infection. Pt would like to pursue evaluation after his cruise.   Pt is very much interested in being weaned off of unnecessary medications including wellbutrin once after his cruise.   Review of Systems  Constitutional: Positive for activity change (slightly decreased given back injury). Negative for fever, fatigue and unexpected weight change.  Respiratory: Negative for chest tightness and shortness of breath.   Cardiovascular: Negative for chest pain, palpitations and leg swelling.  Gastrointestinal: Negative for abdominal pain and abdominal distention.  Genitourinary: Positive for scrotal swelling. Negative for urgency, hematuria, genital sores and testicular pain.  Musculoskeletal: Positive for back pain and myalgias. Negative for arthralgias, gait problem, joint swelling and neck pain.  Skin: Negative for rash.  Neurological: Negative for weakness and numbness.    Past Medical History    Diagnosis Date  . Hypertension   . Congestive heart failure     No echo data available. Myocardial perfusion 2005 with EF 61%, presumed diastolic HF   . Diverticulosis     with history of diverticulitis in 10/2011  . Gout   . Major depression   . Anxiety   . Psychosexual dysfunction with inhibited sexual excitement   . Hyperlipidemia   . Atrial flutter   . Morbid obesity   . Diabetic peripheral neuropathy   . Type II diabetes mellitus dx'd 1991    previously on insulin in 2000 for 3-4 years (but was stopped because adamantly did not want to be on insulin)  . Chronic bronchitis     "get it q yr" (07/26/2013)  . Shortness of breath     "all the time" (07/26/2013)  . OSA (obstructive sleep apnea) 1998    previously on CPAP - unable to tolerate; "going to purchase one next week" (07/26/2013)  . Arthritis     involving knees, ankles, back, elbows  . Chronic back pain   . GERD (gastroesophageal reflux disease)   . Asthma   . Pneumonia    Social, surgical, family history reviewed with patient and updated in appropriate chart locations.     Objective:   Physical Exam Filed Vitals:   03/10/14 1338  BP: 119/76  Pulse: 74  Temp: 97.9 F (36.6 C)   General: sitting in wheelchair, NAD HEENT: PERRL, EOMI, no scleral icterus Cardiac: RRR, no rubs, murmurs or gallops Pulm: clear to auscultation bilaterally, moving normal volumes of air Abd:  soft, nontender, obese, nondistended, BS present Ext: warm and well perfused, no pedal edema MSK: very tense supraspinal muscles, ttp over lumbar supraspinal muscles on right > left, 5/5 LE strength, FROM knees bilaterally with some crepitus, FROM ankles bilaterally, no LE edema, negative straight leg raise Neuro: alert and oriented X3, cranial nerves II-XII grossly intact     Assessment & Plan:  Please see problem oriented charting  Pt discussed with Dr. Cyndie Chime

## 2014-03-10 NOTE — Patient Instructions (Addendum)
Please try to bring all your medicines next time. This helps [place] take good care of you and stops mistakes from medicines that could hurt you.   Heart Medicines:  1. Xarelto 2. Toursemide 3. Spironlactone 4. Coreg 5. Lipitor 6. Amiodarone  DM: we will work on helping your back feel better to continue exercising.  -since you haven't had your metformin and your HgbA1c is 6.3 which is good so we will continue you off the medicine and see you back after your vacation.   Enjoy your trip.

## 2014-03-11 NOTE — Assessment & Plan Note (Signed)
Pt will pursue general surgery referral and evaluation after returns from cruise. No symptoms or signs by today's vitals to suggest systemic infection.  -referral to general surgery when pt returns and informs clinic of his decision

## 2014-03-11 NOTE — Assessment & Plan Note (Signed)
Recent lumbar and spinal films didn't show any acute fractures but known DJD disease. These findings were shared with the patient. Pt appears to have muscle strain of right paraspinal lumbar muscles that is relieved with heat and massage. Pt was advised against NSAIDs given kidney function.  -voltaren gel -continued exercise including water aerobics and walking -refill of Norco provided -d/c flexeril as was not working for patient -physical therapy recommended but pt denied at this time

## 2014-03-13 NOTE — Progress Notes (Signed)
Presenting complaint, physical findings, and medication list reviewed with resident physician Dr Christen Bame and I concur with management plan. Cephas Darby, MD, FACP  Hematology-Oncology/Internal Medicine

## 2014-03-17 ENCOUNTER — Encounter: Payer: Self-pay | Admitting: Internal Medicine

## 2014-03-24 ENCOUNTER — Telehealth: Payer: Self-pay | Admitting: *Deleted

## 2014-03-24 NOTE — Telephone Encounter (Signed)
Pt here to pick up his wife rx - first asking about prior authorization for Voltaren gel; given to [place] to f/u. Then requesting an inflammatory med i.e. Motrin or Aleve to help w/swelling. Talked to Dr Burtis Junes. Pt asking for hard copy instead of buying OTC which states is cheaper. Rx for Ibuprofen 800mg  BID x 7days qty#14 given to pt ; pt also instructed to take PPI while on this medication per Dr Burtis Junes - voiced understanding.

## 2014-03-31 ENCOUNTER — Encounter (HOSPITAL_COMMUNITY): Payer: Self-pay | Admitting: *Deleted

## 2014-04-04 ENCOUNTER — Telehealth: Payer: Self-pay

## 2014-04-04 ENCOUNTER — Other Ambulatory Visit: Payer: Self-pay | Admitting: *Deleted

## 2014-04-04 NOTE — Telephone Encounter (Signed)
Call patient to let him know that his viagra was here at the front desk

## 2014-04-05 ENCOUNTER — Other Ambulatory Visit: Payer: Self-pay

## 2014-04-05 ENCOUNTER — Other Ambulatory Visit (HOSPITAL_COMMUNITY): Payer: BC Managed Care – PPO

## 2014-04-05 ENCOUNTER — Other Ambulatory Visit (HOSPITAL_COMMUNITY): Payer: BC Managed Care – PPO | Admitting: *Deleted

## 2014-04-05 ENCOUNTER — Ambulatory Visit (HOSPITAL_COMMUNITY): Payer: BC Managed Care – PPO | Attending: Internal Medicine | Admitting: Radiology

## 2014-04-05 DIAGNOSIS — I428 Other cardiomyopathies: Secondary | ICD-10-CM

## 2014-04-05 DIAGNOSIS — I5023 Acute on chronic systolic (congestive) heart failure: Secondary | ICD-10-CM | POA: Insufficient documentation

## 2014-04-05 DIAGNOSIS — I509 Heart failure, unspecified: Secondary | ICD-10-CM | POA: Insufficient documentation

## 2014-04-05 DIAGNOSIS — I429 Cardiomyopathy, unspecified: Secondary | ICD-10-CM

## 2014-04-05 DIAGNOSIS — I5022 Chronic systolic (congestive) heart failure: Secondary | ICD-10-CM

## 2014-04-05 DIAGNOSIS — I4892 Unspecified atrial flutter: Secondary | ICD-10-CM

## 2014-04-05 MED ORDER — PERFLUTREN PROTEIN A MICROSPH IV SUSP
3.0000 mL | Freq: Once | INTRAVENOUS | Status: AC
  Administered 2014-04-05: 3 mL via INTRAVENOUS

## 2014-04-05 NOTE — Progress Notes (Signed)
Echocardiogram performed with optison.  

## 2014-04-07 ENCOUNTER — Encounter: Payer: Self-pay | Admitting: Internal Medicine

## 2014-04-07 ENCOUNTER — Ambulatory Visit (INDEPENDENT_AMBULATORY_CARE_PROVIDER_SITE_OTHER): Payer: BC Managed Care – PPO | Admitting: Internal Medicine

## 2014-04-07 VITALS — BP 119/65 | HR 57 | Temp 97.2°F | Ht 67.0 in | Wt 274.2 lb

## 2014-04-07 DIAGNOSIS — M7071 Other bursitis of hip, right hip: Secondary | ICD-10-CM | POA: Insufficient documentation

## 2014-04-07 DIAGNOSIS — M129 Arthropathy, unspecified: Secondary | ICD-10-CM

## 2014-04-07 DIAGNOSIS — N492 Inflammatory disorders of scrotum: Secondary | ICD-10-CM

## 2014-04-07 DIAGNOSIS — N498 Inflammatory disorders of other specified male genital organs: Secondary | ICD-10-CM

## 2014-04-07 DIAGNOSIS — M76899 Other specified enthesopathies of unspecified lower limb, excluding foot: Secondary | ICD-10-CM

## 2014-04-07 DIAGNOSIS — E118 Type 2 diabetes mellitus with unspecified complications: Secondary | ICD-10-CM

## 2014-04-07 DIAGNOSIS — M1990 Unspecified osteoarthritis, unspecified site: Secondary | ICD-10-CM

## 2014-04-07 LAB — GLUCOSE, CAPILLARY: GLUCOSE-CAPILLARY: 121 mg/dL — AB (ref 70–99)

## 2014-04-07 NOTE — Assessment & Plan Note (Signed)
On exam pt has pain on direct palpation over right hip bursa worsened with some hip movement. Pt states that the short course of NSAIDs was extremely helpful and relieved much if not all of the pain.  -bmet for kidney function -prior authorization for voltaren gel sent in  -physical therapy

## 2014-04-07 NOTE — Patient Instructions (Signed)
Please bring your medicines with you each time you come.   Medicines may be  Eye drops  Herbal   Vitamins  Pills  Seeing these help [place] take care of you.  For your diabetes please check your sugars 2x per day for 7 days and will see you in 1 wk to see if we should start metformin back .   Glad you had a wonderful trip.

## 2014-04-07 NOTE — Assessment & Plan Note (Signed)
This has been an ongoing problem for the patient and had been evaluated by [place] and general surgery before. He doesn't have any signs of systemic infection at this time.  -general surgery referral -local wound cares

## 2014-04-07 NOTE — Progress Notes (Signed)
Subjective:    Patient ID: Jeremiah Perkins, male    DOB: 12/17/1960, 53 y.o.   MRN: [id number]  HPI Jeremiah Perkins is a 53 yo man pmh as listed below presents for follow up after his cruise.   The patient states that he stopped taking his metformin and has noticed about a 12 pound weight gain. He doesn't know whether to attribute this to stopping the medication or to his "overindulgence on his cruise." He would not be a lot of sweets but mostly seafood including lobster. He was compliant with his other medications including the toes and his cardiac meds. He has not had any significant shortness of breath, chest pain, lower extremity edema, headaches or blurry vision.   Is not bringing his meter today and says that he does a very "poor job" of checking his blood sugars at best he states maybe once a day. He has not noticed any hypo-glycemic episodes and the highest he can her member is in the 300s. Therefore he would like to defer speaking about diabetes management at the next visit.  In terms of his scrotal abscess he continues to have whitish clear drainage from the 2 sites under his scrotum that had been previously drained. He states that he had some iodine swabs from a previous operation that he has been using on the locations that has improved the drainage. They're not erythematous or draining pus or blood and they have not expanded or retracted. He does not have any dysuria, pyuria, penile discharge, scrotal swelling, fever/chills, or nausea/vomiting/diarrhea. They're becoming a nuisance at this point for the patient and he is tired of having active drainage or time. He is worried that now in the summer he is sweating more and that with these open draining areas that they may be more prone to infection. He's not having any trouble with nocturnal penile tumescence.   In terms of his OA pain it was very much relieved with short course of NSAIDs. Pt describes having stiffness in the morning that  lasts about 5-10 min and is relieved with movement. No joints have been warm, red, or swollen at any time. His hip has been hurting more and is tender if he sleeps on it but the pain was totally relieved with NSAIDs, heat from Jacuzzi, and water jet massage. He has not had this problem before.   Past Medical History  Diagnosis Date  . Hypertension   . Congestive heart failure     No echo data available. Myocardial perfusion 2005 with EF 61%, presumed diastolic HF   . Diverticulosis     with history of diverticulitis in 10/2011  . Gout   . Major depression   . Anxiety   . Psychosexual dysfunction with inhibited sexual excitement   . Hyperlipidemia   . Atrial flutter   . Morbid obesity   . Diabetic peripheral neuropathy   . Type II diabetes mellitus dx'd 1991    previously on insulin in 2000 for 3-4 years (but was stopped because adamantly did not want to be on insulin)  . Chronic bronchitis     "get it q yr" (07/26/2013)  . Shortness of breath     "all the time" (07/26/2013)  . OSA (obstructive sleep apnea) 1998    previously on CPAP - unable to tolerate; "going to purchase one next week" (07/26/2013)  . Arthritis     involving knees, ankles, back, elbows  . Chronic back pain   . GERD (gastroesophageal reflux  disease)   . Asthma   . Pneumonia     Social, surgical, family history reviewed with patient and updated in appropriate chart locations.  Review of Systems  Constitutional: Negative for fatigue and unexpected weight change (did gain weight but pt "expects" given increase in diet over cruise).  Respiratory: Negative for chest tightness and shortness of breath.   Cardiovascular: Negative for chest pain, palpitations and leg swelling.  Gastrointestinal: Negative for nausea, vomiting, diarrhea, constipation and abdominal distention.  Genitourinary: Positive for scrotal swelling. Negative for dysuria, urgency, hematuria, discharge, penile swelling, difficulty urinating, genital sores,  penile pain and testicular pain.  Musculoskeletal: Positive for arthralgias (typical arthritis pain in hip, back, joints) and back pain (chronic back pain).  Neurological: Negative for weakness and numbness.      Objective:   Physical Exam Filed Vitals:   04/07/14 1349  BP: 119/65  Pulse: 57  Temp: 97.2 F (36.2 C)   General: sitting in chair, NAD HEENT: PERRL, EOMI, no scleral icterus Cardiac: RRR, no rubs, murmurs or gallops Pulm: clear to auscultation bilaterally, moving normal volumes of air Abd: soft, nontender, nondistended, BS present Ext: warm and well perfused, no pedal edema MSK: negative straight leg raise, ttp over right hip bursa, hip FROM bilaterally, 5/5 LE strength, no effusions in hand PIP joints  Neuro: alert and oriented X3, cranial nerves II-XII grossly intact    Assessment & Plan:  Please see problem oriented charting  Pt discussed with Dr. Cyndie ChimeGranfortuna

## 2014-04-08 ENCOUNTER — Telehealth: Payer: Self-pay | Admitting: Internal Medicine

## 2014-04-08 LAB — BASIC METABOLIC PANEL
BUN: 26 mg/dL — ABNORMAL HIGH (ref 6–23)
CALCIUM: 9.7 mg/dL (ref 8.4–10.5)
CO2: 28 mEq/L (ref 19–32)
Chloride: 96 mEq/L (ref 96–112)
Creat: 1.43 mg/dL — ABNORMAL HIGH (ref 0.50–1.35)
Glucose, Bld: 109 mg/dL — ABNORMAL HIGH (ref 70–99)
Potassium: 4.1 mEq/L (ref 3.5–5.3)
SODIUM: 137 meq/L (ref 135–145)

## 2014-04-08 NOTE — Telephone Encounter (Signed)
  Reason for call:   I placed an outgoing call to [name] at 100  PM regarding his recent labs done during clinic. His Cr did in fact increase to 1.43 therefore it is in his best interest not to take ibuprofen for OA pain relief. Did not discuss in detail this result and went to voicemail. Therefore will try and call again with this same message.    Assessment/ Plan:   To continue with conservative management of heat, physical therapy, massage and await the voltaren gel which would be safe NSAIDs for the patient to use.   As always, pt is advised that if symptoms worsen or new symptoms arise, they should go to an urgent care facility or to to ER for further evaluation.   Jeremiah Bame, MD   04/08/2014, 12:45 PM

## 2014-04-08 NOTE — Assessment & Plan Note (Addendum)
Pt has very typical OA pain with stiffness <30 min in AM and after resting that is totally relieved with NSAIDs. Making rheumatological arthritis less likely. The only problem is the pt Cr function in setting of heart failure and CKD therefore it is discouraged for long term use. This was discussed with the patient.  -repeat Bmet -pprwrk completed for prior authorization of voltaren gel completed  -pt refused repeat physical therapy -cont conservative management heat, ice, massage, and norco

## 2014-04-10 ENCOUNTER — Ambulatory Visit (INDEPENDENT_AMBULATORY_CARE_PROVIDER_SITE_OTHER): Payer: BC Managed Care – PPO | Admitting: General Surgery

## 2014-04-10 ENCOUNTER — Encounter (INDEPENDENT_AMBULATORY_CARE_PROVIDER_SITE_OTHER): Payer: Self-pay | Admitting: General Surgery

## 2014-04-10 ENCOUNTER — Encounter: Payer: Self-pay | Admitting: Internal Medicine

## 2014-04-10 ENCOUNTER — Ambulatory Visit (INDEPENDENT_AMBULATORY_CARE_PROVIDER_SITE_OTHER): Payer: BC Managed Care – PPO | Admitting: Internal Medicine

## 2014-04-10 ENCOUNTER — Ambulatory Visit: Payer: BC Managed Care – PPO | Admitting: Internal Medicine

## 2014-04-10 VITALS — BP 136/80 | HR 80 | Temp 97.6°F | Ht 67.0 in | Wt 270.0 lb

## 2014-04-10 VITALS — BP 108/63 | HR 62 | Ht 67.0 in | Wt 272.0 lb

## 2014-04-10 DIAGNOSIS — N492 Inflammatory disorders of scrotum: Secondary | ICD-10-CM

## 2014-04-10 DIAGNOSIS — N498 Inflammatory disorders of other specified male genital organs: Secondary | ICD-10-CM

## 2014-04-10 DIAGNOSIS — I5022 Chronic systolic (congestive) heart failure: Secondary | ICD-10-CM

## 2014-04-10 DIAGNOSIS — I4892 Unspecified atrial flutter: Secondary | ICD-10-CM

## 2014-04-10 MED ORDER — DOXYCYCLINE HYCLATE 100 MG PO TABS: 100.0000 mg | ORAL_TABLET | Freq: Two times a day (BID) | ORAL | Status: DC

## 2014-04-10 MED ORDER — BENZALKONIUM CHLORIDE 50 % EX SOLN: CUTANEOUS | Status: DC

## 2014-04-10 NOTE — Progress Notes (Signed)
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 He was placed on Amiodarone several months ago and underwent DCCV in January. He appears to have maintained NSR since then and his CHF symptoms have improved. His EF has normalized by echo. He feels well but admits to a fairly sedentary lifestyle. No other complaints. Allergies  Allergen Reactions  . Eggs Or Egg-Derived Products Nausea And Vomiting    Did ok with flu shot, seems to be an issue with the type of preparation of egg product.  . Sulfa Antibiotics Nausea And Vomiting        Past Medical History  Diagnosis Date  . Hypertension   . Congestive heart failure     No echo data available. Myocardial perfusion 2005 with EF 61%, presumed diastolic HF   . Diverticulosis     with history of diverticulitis in 10/2011  . Gout   . Major depression   . Anxiety   . Psychosexual dysfunction with inhibited sexual excitement   . Hyperlipidemia   . Atrial flutter   . Morbid obesity   . Diabetic peripheral neuropathy   . Type II diabetes mellitus dx'd 1991    previously on insulin in 2000 for 3-4 years (but was stopped because adamantly did not want to be on insulin)  . Chronic bronchitis     "get it q yr" (07/26/2013)  . Shortness of breath     "all the time" (07/26/2013)  . OSA (obstructive sleep apnea) 1998    previously on CPAP - unable to tolerate; "going to purchase one next week" (07/26/2013)  . Arthritis     involving knees, ankles, back, elbows  . Chronic back pain   . GERD (gastroesophageal reflux disease)   . Asthma   . Pneumonia     ROS:   All systems reviewed and negative except as noted in the HPI.   Past Surgical History  Procedure Laterality Date  . Ankle reconstruction Right 1990's    2/2 trauma sustained after fall  . 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 Cyst removal leg Left  1960's    back of left leg  . Cardiac catheterization  1990's  . Tee without cardioversion N/A 07/28/2013    Procedure: TRANSESOPHAGEAL ECHOCARDIOGRAM (TEE);  Surgeon: Laurey Morale, MD;  Location: [facility] ENDOSCOPY;  Service: Cardiovascular;  Laterality: N/A;  . Cardioversion N/A 07/28/2013    Procedure: CARDIOVERSION;  Surgeon: Laurey Morale, MD;  Location: [facility] ENDOSCOPY;  Service: Cardiovascular;  Laterality: N/A;  . Cardioversion N/A 10/26/2013    Procedure: CARDIOVERSION;  Surgeon: Cassell Clement, MD;  Location: [facility] ENDOSCOPY;  Service: Cardiovascular;  Laterality: N/A;     Family History  Problem Relation Age of Onset  . Diabetes Mother   . CAD Mother 57    requiring quadruple bypass  . Hypertension Mother   . Alzheimer's disease Mother   . Stomach cancer Father   . Lung cancer Father     was a smoker  . Gout Father   . CAD Brother 48    requiring quadruple bypass  . Seizures Brother   . Hypertension Brother   . Diabetes Maternal Grandmother   . Alzheimer's disease Maternal Grandmother   . Breast cancer Paternal Aunt   . Diabetes Maternal Aunt   . Seizures Paternal Uncle  History   Social History  . Marital Status: Married    Spouse Name: N/A    Number of Children: 3  . Years of Education: 14   Occupational History  . Now unemployed     Barrister's clerkAirplane mechanic at [employer]  .  [employer]   Social History Main Topics  . Smoking status: Current Some Day Smoker -- 0.12 packs/day for 10 years    Types: Cigarettes  . Smokeless tobacco: Never Used     Comment: Smokes 3 cigs pers week / previously smoked 1ppd x 1-2 years; "nothing since ~ 2010" ; "chewed tobacco as a kid" (07/26/2013)  . Alcohol Use: Yes     Comment: 07/26/2013 "dron't drink q wk; only on big sporting events, birthdays, weddings, etc"  . Drug Use: Yes    Special: Marijuana     Comment: 07/26/2013 "smoked weed in high school and college; nothing for years"  . Sexual Activity: Yes   Other Topics Concern  . Not on  file   Social History Narrative   Previously worked as Barrister's clerkairplane mechanic and lives at home with his wife. They each have children but none between the two of them.     BP 108/63  Pulse 62  Ht 5\' 7"  (1.702 m)  Wt 272 lb (123.378 kg)  BMI 42.59 kg/m2  Physical Exam:  Obese appearing middle aged man, NAD HEENT: Unremarkable Neck:  No JVD, no thyromegally Back:  No CVA tenderness Lungs:  Clear with no wheezes HEART:  Regular rate rhythm, no murmurs, no rubs, no clicks Abd:  soft, positive bowel sounds, no organomegally, no rebound, no guarding Ext:  2 plus pulses, no edema, no cyanosis, no clubbing Skin:  No rashes no nodules Neuro:  CN II through XII intact, motor grossly intact  EKG - NSR   Assess/Plan:

## 2014-04-10 NOTE — Progress Notes (Signed)
Attending physician note: Presenting problems, physical findings, and medications, reviewed with resident physician Dr. Christen Bame and I concur with her management. Cephas Darby, M.D., FACP

## 2014-04-10 NOTE — Progress Notes (Signed)
Patient ID: Jeremiah Perkins, male   DOB: [birth year], 53 y.o.   MRN: [id number]  Chief Complaint  Patient presents with  . abscess scrotal    HPI Jeremiah Perkins is a 53 y.o. male.   HPI   He has multiple medical problems and has had 2 areas on his left hemiscrotum it had been draining for over a year. He states he may have had an incision and drainage of 1. Recently, he started cleaning with Betadine soap and now the drainage has all but stopped. He's been referred by Dr. Burtis JunesSadek for further evaluation and treatment.   Past Medical History  Diagnosis Date  . Hypertension   . Congestive heart failure     No echo data available. Myocardial perfusion 2005 with EF 61%, presumed diastolic HF   . Diverticulosis     with history of diverticulitis in 10/2011  . Gout   . Major depression   . Anxiety   . Psychosexual dysfunction with inhibited sexual excitement   . Hyperlipidemia   . Atrial flutter   . Morbid obesity   . Diabetic peripheral neuropathy   . Type II diabetes mellitus dx'd 1991    previously on insulin in 2000 for 3-4 years (but was stopped because adamantly did not want to be on insulin)  . Chronic bronchitis     "get it q yr" (07/26/2013)  . Shortness of breath     "all the time" (07/26/2013)  . OSA (obstructive sleep apnea) 1998    previously on CPAP - unable to tolerate; "going to purchase one next week" (07/26/2013)  . Arthritis     involving knees, ankles, back, elbows  . Chronic back pain   . GERD (gastroesophageal reflux disease)   . Asthma   . Pneumonia     Past Surgical History  Procedure Laterality Date  . Ankle reconstruction Right 1990's    2/2 trauma sustained after fall  . 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 Cyst removal leg Left 1960's    back of left leg  . Cardiac catheterization  1990's  . Tee without cardioversion N/A 07/28/2013    Procedure: TRANSESOPHAGEAL ECHOCARDIOGRAM (TEE);  Surgeon: Laurey Moralealton S McLean, MD;  [facility];   Service: Cardiovascular;  Laterality: N/A;  . Cardioversion N/A 07/28/2013    Procedure: CARDIOVERSION;  Surgeon: Laurey Moralealton S McLean, MD;  [facility];  Service: Cardiovascular;  Laterality: N/A;  . Cardioversion N/A 10/26/2013    Procedure: CARDIOVERSION;  Surgeon: Cassell Clementhomas Brackbill, MD;  [facility];  Service: Cardiovascular;  Laterality: N/A;    Family History  Problem Relation Age of Onset  . Diabetes Mother   . CAD Mother 5070    requiring quadruple bypass  . Hypertension Mother   . Alzheimer's disease Mother   . Stomach cancer Father   . Lung cancer Father     was a smoker  . Gout Father   . CAD Brother 48    requiring quadruple bypass  . Seizures Brother   . Hypertension Brother   . Diabetes Maternal Grandmother   . Alzheimer's disease Maternal Grandmother   . Breast cancer Paternal Aunt   . Diabetes Maternal Aunt   . Seizures Paternal Uncle     Social History History  Substance Use Topics  . Smoking status: Current Some Day Smoker -- 0.12 packs/day for 10 years    Types: Cigarettes  . Smokeless tobacco: Never Used     Comment: Smokes  3 cigs pers week / previously smoked 1ppd x 1-2 years; "nothing since ~ 2010" ; "chewed tobacco as a kid" (07/26/2013)  . Alcohol Use: Yes     Comment: 07/26/2013 "dron't drink q wk; only on big sporting events, birthdays, weddings, etc"    Allergies  Allergen Reactions  . Eggs Or Egg-Derived Products Nausea And Vomiting    Did ok with flu shot, seems to be an issue with the type of preparation of egg product.  . Sulfa Antibiotics Nausea And Vomiting    Current Outpatient Prescriptions  Medication Sig Dispense Refill  . allopurinol (ZYLOPRIM) 300 MG tablet Take 1 tablet (300 mg total) by mouth daily.  30 tablet  3  . amiodarone (PACERONE) 200 MG tablet Take 1 tablet (200 mg total) by mouth daily.  30 tablet  3  . atorvastatin (LIPITOR) 40 MG tablet Take 1 tablet (40 mg total) by mouth daily.  30 tablet  0  . buPROPion  (WELLBUTRIN XL) 150 MG 24 hr tablet 150 mg.      . carvedilol (COREG) 6.25 MG tablet Take 1 tablet (6.25 mg total) by mouth 2 (two) times daily with a meal.  60 tablet  3  . colchicine 0.6 MG tablet Take 1 tablet (0.6 mg total) by mouth daily.  30 tablet  2  . diclofenac sodium (VOLTAREN) 1 % GEL Apply 2 g topically 4 (four) times daily.  100 g  2  . docusate sodium (COLACE) 100 MG capsule Take 100 mg by mouth daily as needed for mild constipation. For constipation      . gabapentin (NEURONTIN) 600 MG tablet Takes 600mg  in the am & 1200mg  in the pm      . HYDROcodone-acetaminophen (NORCO/VICODIN) 5-325 MG per tablet Take 1-2 tablets by mouth every 6 (six) hours as needed for moderate pain.  60 tablet  0  . levalbuterol (XOPENEX HFA) 45 MCG/ACT inhaler Inhale 1-2 puffs into the lungs every 4 (four) hours as needed for wheezing.  1 Inhaler  12  . Liraglutide (VICTOZA) 18 MG/3ML SOPN Inject 1.8 mg into the skin daily.  9 pen  3  . LORazepam (ATIVAN) 0.5 MG tablet Take 1 tablet (0.5 mg total) by mouth at bedtime as needed for anxiety. For anxiety  30 tablet  2  . losartan (COZAAR) 25 MG tablet TAKE 1 TABLET (25 MG TOTAL) BY MOUTH DAILY.  30 tablet  2  . Multiple Vitamins-Minerals (MEGA MULTIVITAMIN FOR MEN) TABS Take 1 capsule by mouth every morning.  30 tablet  3  . sildenafil (VIAGRA) 100 MG tablet Take 1 tablet (100 mg total) by mouth daily as needed for erectile dysfunction (PATIENT GETS THIS MEDICATIONS FROM [facility], SEE NOTE 01/24/2014).  10 tablet  3  . spironolactone (ALDACTONE) 25 MG tablet Take 0.5 tablets (12.5 mg total) by mouth daily.  15 tablet  3  . torsemide (DEMADEX) 20 MG tablet Take 2 tablets (40 mg total) by mouth 2 (two) times daily.  60 tablet  1  . XARELTO 20 MG TABS tablet TAKE 1 TABLET (20 MG TOTAL) BY MOUTH DAILY WITH SUPPER.  30 tablet  6  . Benzalkonium Chloride 50 % SOLN Clean scrotal area daily.  100 mL  0  . doxycycline (VIBRA-TABS) 100 MG tablet Take 1 tablet (100 mg total)  by mouth 2 (two) times daily.  28 tablet  0   No current facility-administered medications for this visit.    Review of Systems Review of Systems  Constitutional: Negative for fever and chills.  Genitourinary: Positive for scrotal swelling. Negative for difficulty urinating.    Blood pressure 136/80, pulse 80, temperature 97.6 F (36.4 C), height 5\' 7"  (1.702 m), weight 270 lb (122.471 kg).  Physical Exam Physical Exam  Constitutional:  Obese male.  Genitourinary:  1-1.5 cm left hemiscrotal firm mass with minimal clear drainage. In the left groin adjacent to this is about a 2-3 cm firm mass is nontender. No erythema around either of these masses.  No purulent drainage.    Data Reviewed Some nodes in EPIC  Assessment    Chronic left hemiscrotum abscesses. These are improved when she's been using Betadine. Has not been on oral antibiotics recently. They do not look acutely infected.     Plan    Doxycycline for 2 weeks. Hibiclens scrub the area daily. Warm moist cloth massage daily. Return visit in 3 weeks.        Jim Desanctis [place] 04/10/2014, 3:55 PM

## 2014-04-10 NOTE — Patient Instructions (Signed)
Clean area with Hibiclens soap daily. Massage area with warm, moist washcloth daily. Take antibiotics as directed.

## 2014-04-10 NOTE — Patient Instructions (Signed)
Your physician wants you to follow-up in: 6 months with Dr Taylor You will receive a reminder letter in the mail two months in advance. If you don't receive a letter, please call our office to schedule the follow-up appointment.  

## 2014-04-10 NOTE — Assessment & Plan Note (Signed)
His heart failure symptoms are improved. He will return in 6 months. He will continue his current meds.

## 2014-04-10 NOTE — Assessment & Plan Note (Signed)
He is maintaining NSR on low dose amiodarone. Will continue his current meds.

## 2014-04-11 ENCOUNTER — Telehealth (INDEPENDENT_AMBULATORY_CARE_PROVIDER_SITE_OTHER): Payer: Self-pay

## 2014-04-11 NOTE — Telephone Encounter (Signed)
Pt calling requesting cheaper wash other than Hibiclens. Pt states the cost is 70 dollars and that is for a small bottle. His insurance does not cover this. I advised pt this request will be sent to Dr Abbey Chatters to review. Pt can be reached at [phone].

## 2014-04-11 NOTE — Telephone Encounter (Signed)
Pt advised of attached msg from Dr Abbey Chatters. Pt agrees.

## 2014-04-11 NOTE — Telephone Encounter (Signed)
Continue to use Betadine.

## 2014-04-13 ENCOUNTER — Telehealth: Payer: Self-pay | Admitting: *Deleted

## 2014-04-13 NOTE — Telephone Encounter (Signed)
Call to pt to notify him of need to stop taking NSAIDS.  Pt was advised to stop Ibuprofen and Advil like meds.  Pt said that he had not taken any for about a week as he has run out.  Pt has an appointment on tomorrow and wants to discus what other things may be causing the problem with his Kidneys.  Angelina Ok, RN 04/13/2014 4:26 PM

## 2014-04-14 ENCOUNTER — Ambulatory Visit (INDEPENDENT_AMBULATORY_CARE_PROVIDER_SITE_OTHER): Payer: BC Managed Care – PPO | Admitting: Internal Medicine

## 2014-04-14 ENCOUNTER — Encounter: Payer: Self-pay | Admitting: Internal Medicine

## 2014-04-14 VITALS — BP 117/73 | HR 57 | Temp 97.7°F | Ht 67.0 in | Wt 272.5 lb

## 2014-04-14 DIAGNOSIS — M545 Low back pain, unspecified: Secondary | ICD-10-CM

## 2014-04-14 DIAGNOSIS — E118 Type 2 diabetes mellitus with unspecified complications: Secondary | ICD-10-CM

## 2014-04-14 LAB — GLUCOSE, CAPILLARY: Glucose-Capillary: 139 mg/dL — ABNORMAL HIGH (ref 70–99)

## 2014-04-14 MED ORDER — METFORMIN HCL 500 MG PO TABS: 500.0000 mg | ORAL_TABLET | Freq: Two times a day (BID) | ORAL | Status: DC

## 2014-04-14 MED ORDER — HYDROCODONE-ACETAMINOPHEN 5-325 MG PO TABS: 1.0000 | ORAL_TABLET | Freq: Four times a day (QID) | ORAL | Status: DC | PRN

## 2014-04-14 NOTE — Progress Notes (Signed)
   Subjective:    Patient ID: Jeremiah Perkins, male    DOB: 12/28/1960, 53 y.o.   MRN: [id number]  HPI [name] is a 53 yo man pmh as listed below presents for DM management.   In terms of medication compliance: pt reports compliant most of the time, diabetic diet compliance: noncompliant much of the time, further diabetic ROS: no polyuria or polydipsia, no chest pain, dyspnea or TIA's, no numbness, tingling or pain in extremities, no unusual visual symptoms, no hypoglycemia, no medication side effects noted, weight has decreased.  Other symptoms and concerns: wants to fully discontinue many of his medications including and has already discontinued metformin.  The patient was able to check his blood sugars at least a little inconsistently for the past 2 weeks twice a day that showed an average of 190 and his preprandial Z. Breakfast in the 160s with his highest readings 279. The patient states that with the increased heat he has been drinking more fruit juices which he thinks is attributing to his higher CBG readings and also his hip pain is not greatly improved his limiting his physical activity.  Past Medical History  Diagnosis Date  . Hypertension   . Congestive heart failure     No echo data available. Myocardial perfusion 2005 with EF 61%, presumed diastolic HF   . Diverticulosis     with history of diverticulitis in 10/2011  . Gout   . Major depression   . Anxiety   . Psychosexual dysfunction with inhibited sexual excitement   . Hyperlipidemia   . Atrial flutter   . Morbid obesity   . Diabetic peripheral neuropathy   . Type II diabetes mellitus dx'd 1991    previously on insulin in 2000 for 3-4 years (but was stopped because adamantly did not want to be on insulin)  . Chronic bronchitis     "get it q yr" (07/26/2013)  . Shortness of breath     "all the time" (07/26/2013)  . OSA (obstructive sleep apnea) 1998    previously on CPAP - unable to tolerate; "going to purchase one  next week" (07/26/2013)  . Arthritis     involving knees, ankles, back, elbows  . Chronic back pain   . GERD (gastroesophageal reflux disease)   . Asthma   . Pneumonia    Review of Systems  Constitutional: Negative for fever, fatigue and unexpected weight change.  Respiratory: Negative for cough, chest tightness and shortness of breath.   Cardiovascular: Negative for chest pain, palpitations and leg swelling.  Gastrointestinal: Negative for abdominal pain, diarrhea, constipation and abdominal distention.  Endocrine: Positive for polydipsia. Negative for polyuria.  Genitourinary: Negative for difficulty urinating.  Musculoskeletal: Positive for arthralgias, back pain (chronic) and gait problem (2/2 hip pain ).      Objective:   Physical Exam Filed Vitals:   04/14/14 1412  BP: 117/73  Pulse: 57  Temp: 97.7 F (36.5 C)   General: sitting in chair, NAD HEENT: PERRL, EOMI, no scleral icterus Cardiac: RRR, no rubs, murmurs or gallops Pulm: clear to auscultation bilaterally, moving normal volumes of air Abd: soft, nontender, nondistended, BS present Ext: warm and well perfused, no pedal edema Neuro: alert and oriented X3, cranial nerves II-XII grossly intact    Assessment & Plan:  Please see problem oriented charting  Pt discussed with Dr. Aundria Rud

## 2014-04-14 NOTE — Assessment & Plan Note (Addendum)
Pt continues to not be taking metformin since about 2/15 at patient's own discretion as he would like to "limit his pills and save money." today he brings in his meter and his average cbg readings seem to be in the 200s. With this trend the concern coupled with the patient's recent increase in sugar beverages he may fall back out of control. This was clearly discussed with the patient and the recommendation to start metformin 500 twice a day. The patient is very reluctant and apprehensive that this will "make any changes." But would like this provider to prescribed and he may "start when I think I am ready."  - metformin 500 mg BID - f/u in 3 months when repeat A1c can be done

## 2014-04-14 NOTE — Patient Instructions (Signed)
Please bring your medicines with you each time you come.   Medicines may be  Eye drops  Herbal   Vitamins  Pills  Seeing these help [place] take care of you.   In terms of your diabetes it appears that it is best for you to restart your metformin.

## 2014-04-18 NOTE — Progress Notes (Signed)
Case discussed with Dr. Sadek soon after the resident saw the patient.  We reviewed the resident's history and exam and pertinent patient test results.  I agree with the assessment, diagnosis, and plan of care documented in the resident's note. 

## 2014-04-21 ENCOUNTER — Other Ambulatory Visit (HOSPITAL_COMMUNITY): Payer: BC Managed Care – PPO

## 2014-04-26 ENCOUNTER — Other Ambulatory Visit (HOSPITAL_COMMUNITY): Payer: Self-pay | Admitting: Anesthesiology

## 2014-05-05 ENCOUNTER — Other Ambulatory Visit: Payer: Self-pay | Admitting: Internal Medicine

## 2014-05-05 ENCOUNTER — Telehealth: Payer: Self-pay | Admitting: Internal Medicine

## 2014-05-05 DIAGNOSIS — F528 Other sexual dysfunction not due to a substance or known physiological condition: Secondary | ICD-10-CM

## 2014-05-05 MED ORDER — SILDENAFIL CITRATE 100 MG PO TABS: 100.0000 mg | ORAL_TABLET | Freq: Every day | ORAL | Status: DC | PRN

## 2014-05-05 NOTE — Telephone Encounter (Signed)
Have him have his insurance company send me an inquiry about his health and I can inform them about his condition.

## 2014-05-05 NOTE — Telephone Encounter (Signed)
Advised patient

## 2014-05-05 NOTE — Telephone Encounter (Signed)
Will forward to  Dr. Brackbill for review 

## 2014-05-05 NOTE — Telephone Encounter (Signed)
New message          Pt needs letter stating he needs to keep his ins due to his declining health / Can you help pt with this?

## 2014-05-08 ENCOUNTER — Other Ambulatory Visit: Payer: BC Managed Care – PPO

## 2014-05-10 ENCOUNTER — Ambulatory Visit: Payer: BC Managed Care – PPO | Admitting: Cardiology

## 2014-05-15 ENCOUNTER — Encounter (INDEPENDENT_AMBULATORY_CARE_PROVIDER_SITE_OTHER): Payer: BC Managed Care – PPO | Admitting: General Surgery

## 2014-05-16 ENCOUNTER — Telehealth: Payer: Self-pay | Admitting: Cardiology

## 2014-05-16 NOTE — Telephone Encounter (Signed)
Walk in pt Form " [employer]" papers Dropped Off gave To [facility]  6.23.15/km

## 2014-05-17 ENCOUNTER — Ambulatory Visit: Payer: BC Managed Care – PPO | Admitting: Internal Medicine

## 2014-05-18 ENCOUNTER — Other Ambulatory Visit (HOSPITAL_COMMUNITY): Payer: Self-pay | Admitting: *Deleted

## 2014-05-19 ENCOUNTER — Other Ambulatory Visit: Payer: Self-pay

## 2014-05-19 DIAGNOSIS — I5022 Chronic systolic (congestive) heart failure: Secondary | ICD-10-CM

## 2014-05-19 MED ORDER — TORSEMIDE 20 MG PO TABS: 40.0000 mg | ORAL_TABLET | Freq: Two times a day (BID) | ORAL | Status: DC

## 2014-06-01 ENCOUNTER — Telehealth: Payer: Self-pay | Admitting: Cardiology

## 2014-06-01 NOTE — Telephone Encounter (Signed)
New message     Patient is calling to see if the papers have been completed to send to [employer] for his viagra refills.  Please call him today.

## 2014-06-01 NOTE — Telephone Encounter (Signed)
Left message form faxed and confirmation received, call back

## 2014-06-02 ENCOUNTER — Other Ambulatory Visit (HOSPITAL_COMMUNITY): Payer: Self-pay | Admitting: Anesthesiology

## 2014-06-06 NOTE — Telephone Encounter (Signed)
Never heard back from patient, left another message if any question to call back.

## 2014-06-12 ENCOUNTER — Telehealth: Payer: Self-pay

## 2014-06-12 NOTE — Telephone Encounter (Signed)
Called patient to let him know that his Viagra

## 2014-06-20 ENCOUNTER — Other Ambulatory Visit: Payer: Self-pay | Admitting: *Deleted

## 2014-06-20 NOTE — Progress Notes (Signed)
Patient ID: Jeremiah Perkins, male   DOB: 23-Oct-1961, 53 y.o.   MRN: [id number] Patient was approved for his viagra 100 mg through [employer] until 7.14.16 Ref # [id number]

## 2014-06-21 ENCOUNTER — Telehealth (HOSPITAL_COMMUNITY): Payer: Self-pay | Admitting: Vascular Surgery

## 2014-06-21 DIAGNOSIS — I509 Heart failure, unspecified: Secondary | ICD-10-CM

## 2014-06-21 NOTE — Telephone Encounter (Signed)
[name] called about pt Torsemide prescription.. Please advise

## 2014-06-22 NOTE — Telephone Encounter (Signed)
Attempting to return call to pharmacy Closed for lunch until 2:30 pm, will attempt to contact at a later time

## 2014-06-23 MED ORDER — HYDROCODONE-ACETAMINOPHEN 5-325 MG PO TABS: 1.0000 | ORAL_TABLET | Freq: Four times a day (QID) | ORAL | Status: DC | PRN

## 2014-06-23 MED ORDER — TORSEMIDE 20 MG PO TABS: ORAL_TABLET | ORAL | Status: DC

## 2014-06-23 NOTE — Telephone Encounter (Signed)
Pt informed

## 2014-06-23 NOTE — Addendum Note (Signed)
Addended by: Theresia Bough on: 06/23/2014 03:31 PM   Modules accepted: Orders

## 2014-06-23 NOTE — Telephone Encounter (Signed)
Pharmacist called to request new rx as refill was sent in with #10 New rx sent

## 2014-06-26 ENCOUNTER — Other Ambulatory Visit (HOSPITAL_COMMUNITY): Payer: Self-pay | Admitting: Anesthesiology

## 2014-07-02 ENCOUNTER — Other Ambulatory Visit (HOSPITAL_COMMUNITY): Payer: Self-pay | Admitting: Cardiology

## 2014-07-17 ENCOUNTER — Other Ambulatory Visit (HOSPITAL_COMMUNITY): Payer: Self-pay | Admitting: Cardiology

## 2014-07-28 ENCOUNTER — Encounter: Payer: Self-pay | Admitting: Internal Medicine

## 2014-07-28 ENCOUNTER — Ambulatory Visit (INDEPENDENT_AMBULATORY_CARE_PROVIDER_SITE_OTHER): Payer: BC Managed Care – PPO | Admitting: Internal Medicine

## 2014-07-28 VITALS — BP 143/67 | HR 70 | Temp 97.9°F | Ht 67.0 in | Wt 269.3 lb

## 2014-07-28 DIAGNOSIS — I10 Essential (primary) hypertension: Secondary | ICD-10-CM

## 2014-07-28 DIAGNOSIS — M129 Arthropathy, unspecified: Secondary | ICD-10-CM | POA: Diagnosis not present

## 2014-07-28 DIAGNOSIS — R1013 Epigastric pain: Secondary | ICD-10-CM

## 2014-07-28 DIAGNOSIS — Z23 Encounter for immunization: Secondary | ICD-10-CM | POA: Diagnosis not present

## 2014-07-28 DIAGNOSIS — M1990 Unspecified osteoarthritis, unspecified site: Secondary | ICD-10-CM

## 2014-07-28 DIAGNOSIS — E118 Type 2 diabetes mellitus with unspecified complications: Secondary | ICD-10-CM

## 2014-07-28 LAB — POCT GLYCOSYLATED HEMOGLOBIN (HGB A1C): HEMOGLOBIN A1C: 6.4

## 2014-07-28 LAB — GLUCOSE, CAPILLARY: GLUCOSE-CAPILLARY: 114 mg/dL — AB (ref 70–99)

## 2014-07-28 MED ORDER — METFORMIN HCL 1000 MG PO TABS: 1000.0000 mg | ORAL_TABLET | Freq: Every day | ORAL | Status: AC

## 2014-07-28 MED ORDER — HYDROCODONE-ACETAMINOPHEN 5-325 MG PO TABS
1.0000 | ORAL_TABLET | Freq: Four times a day (QID) | ORAL | Status: DC | PRN
Start: 2014-07-28 — End: 2014-09-04

## 2014-07-28 NOTE — Assessment & Plan Note (Signed)
Lab Results  Component Value Date   HGBA1C 6.4 07/28/2014   HGBA1C 6.3 03/10/2014   HGBA1C 5.6 11/04/2013     Assessment: Diabetes control:  at goal Progress toward A1C goal:    Comments: pt continues to maintain goal HgbA1c  Plan: Medications:  will continue metformin 100mg  q breakfast as pt was self doing this for the past 3 months despite being ordered BID dosing but continues to make and maintain lifestyle changes that have achieved good control Home glucose monitoring: Frequency:   Timing:   Instruction/counseling given: reminded to get eye exam, reminded to bring blood glucose meter & log to each visit, reminded to bring medications to each visit, discussed foot care, discussed the need for weight loss and discussed diet Educational resources provided: brochure Self management tools provided:   Other plans: pt to receive flu shot today, urine microalbumin and lipid panel at next visit

## 2014-07-28 NOTE — Patient Instructions (Signed)
General Instructions:   Thank you for bringing your medicines today. This helps [place] keep you safe from mistakes.   Progress Toward Treatment Goals:  Treatment Goal 12/30/2013  Hemoglobin A1C unchanged  Blood pressure -  Stop smoking -    Self Care Goals & Plans:  Self Care Goal 07/28/2014  Manage my medications take my medicines as prescribed; bring my medications to every visit; refill my medications on time  Monitor my health keep track of my blood glucose; bring my glucose meter and log to each visit  Eat healthy foods drink diet soda or water instead of juice or soda; eat more vegetables; eat foods that are low in salt; eat baked foods instead of fried foods; eat fruit for snacks and desserts  Be physically active -  Stop smoking -    Home Blood Glucose Monitoring 01/17/2013  Check my blood sugar once a day  When to check my blood sugar before breakfast     Care Management & Community Referrals:  Referral 01/17/2013  Referrals made for care management support none needed

## 2014-07-28 NOTE — Progress Notes (Signed)
Subjective:   Patient ID: Jeremiah Perkins male   DOB: 08-10-1961 53 y.o.   MRN: [id number]  HPI: Jeremiah Perkins is a 53 y.o. M pmh as listed below presents for 3 month DM follow up.   DM - Patient checking blood sugars 2-3 times daily,at breakfast and before bed.Currently taking 2 500mg  tabs in the AM only despite being prescribed BID dosing. No hypoglycemic episodes since last visit. denies polyuria, polydipsia, nausea, vomiting, diarrhea.  does request refills today. Pt wants to limit pill burden and therefore would like to change prescription to 1000 mg to take once daily.  Other new complaints is some stomach burning when patient takes medication without food. Has tried OTC tums that has improved symptoms. No other weight loss, dysphagia, or odynophagia.    Past Medical History  Diagnosis Date  . Hypertension   . Congestive heart failure     No echo data available. Myocardial perfusion 2005 with EF 61%, presumed diastolic HF   . Diverticulosis     with history of diverticulitis in 10/2011  . Gout   . Major depression   . Anxiety   . Psychosexual dysfunction with inhibited sexual excitement   . Hyperlipidemia   . Atrial flutter   . Morbid obesity   . Diabetic peripheral neuropathy   . Type II diabetes mellitus dx'd 1991    previously on insulin in 2000 for 3-4 years (but was stopped because adamantly did not want to be on insulin)  . Chronic bronchitis     "get it q yr" (07/26/2013)  . Shortness of breath     "all the time" (07/26/2013)  . OSA (obstructive sleep apnea) 1998    previously on CPAP - unable to tolerate; "going to purchase one next week" (07/26/2013)  . Arthritis     involving knees, ankles, back, elbows  . Chronic back pain   . GERD (gastroesophageal reflux disease)   . Asthma   . Pneumonia    Current Outpatient Prescriptions  Medication Sig Dispense Refill  . allopurinol (ZYLOPRIM) 300 MG tablet Take 1 tablet (300 mg total) by mouth daily.  30 tablet   3  . amiodarone (PACERONE) 200 MG tablet TAKE 1 TABLET (200 MG TOTAL) BY MOUTH DAILY.  30 tablet  2  . atorvastatin (LIPITOR) 40 MG tablet Take 1 tablet (40 mg total) by mouth daily.  30 tablet  0  . Benzalkonium Chloride 50 % SOLN Clean scrotal area daily.  100 mL  0  . buPROPion (WELLBUTRIN XL) 150 MG 24 hr tablet 150 mg.      . carvedilol (COREG) 6.25 MG tablet TAKE 1 TABLET (6.25 MG TOTAL) BY MOUTH 2 (TWO) TIMES DAILY WITH A MEAL.  60 tablet  2  . colchicine 0.6 MG tablet Take 1 tablet (0.6 mg total) by mouth daily.  30 tablet  2  . diclofenac sodium (VOLTAREN) 1 % GEL Apply 2 g topically 4 (four) times daily.  100 g  2  . docusate sodium (COLACE) 100 MG capsule Take 100 mg by mouth daily as needed for mild constipation. For constipation      . doxycycline (VIBRA-TABS) 100 MG tablet Take 1 tablet (100 mg total) by mouth 2 (two) times daily.  28 tablet  0  . gabapentin (NEURONTIN) 600 MG tablet Takes 600mg  in the am & 1200mg  in the pm      . HYDROcodone-acetaminophen (NORCO/VICODIN) 5-325 MG per tablet Take 1-2 tablets by mouth every 6 (six)  hours as needed for moderate pain.  60 tablet  0  . levalbuterol (XOPENEX HFA) 45 MCG/ACT inhaler Inhale 1-2 puffs into the lungs every 4 (four) hours as needed for wheezing.  1 Inhaler  12  . Liraglutide (VICTOZA) 18 MG/3ML SOPN Inject 1.8 mg into the skin daily.  9 pen  3  . LORazepam (ATIVAN) 0.5 MG tablet Take 1 tablet (0.5 mg total) by mouth at bedtime as needed for anxiety. For anxiety  30 tablet  2  . losartan (COZAAR) 25 MG tablet TAKE 1 TABLET (25 MG TOTAL) BY MOUTH DAILY.  30 tablet  2  . metFORMIN (GLUCOPHAGE) 500 MG tablet Take 1 tablet (500 mg total) by mouth 2 (two) times daily with a meal.  60 tablet  11  . Multiple Vitamins-Minerals (MEGA MULTIVITAMIN FOR MEN) TABS Take 1 capsule by mouth every morning.  30 tablet  3  . sildenafil (VIAGRA) 100 MG tablet Take 1 tablet (100 mg total) by mouth daily as needed for erectile dysfunction (pt gets  from [employer] note 01/24/14).  10 tablet  3  . spironolactone (ALDACTONE) 25 MG tablet TAKE 1/2 TABLET (12.5 MG TOTAL) BY MOUTH DAILY.  15 tablet  2  . torsemide (DEMADEX) 20 MG tablet Take 2 tablets (40 mg total) by mouth 2 (two) times daily.  60 tablet  6  . torsemide (DEMADEX) 20 MG tablet TAKE 80 MG (4 TABLETS) DAILY OR AS INSTRUCTED BY YOUR PROVIDER.  120 tablet  2  . XARELTO 20 MG TABS tablet TAKE 1 TABLET (20 MG TOTAL) BY MOUTH DAILY WITH SUPPER.  30 tablet  0   No current facility-administered medications for this visit.   Family History  Problem Relation Age of Onset  . Diabetes Mother   . CAD Mother 35    requiring quadruple bypass  . Hypertension Mother   . Alzheimer's disease Mother   . Stomach cancer Father   . Lung cancer Father     was a smoker  . Gout Father   . CAD Brother 48    requiring quadruple bypass  . Seizures Brother   . Hypertension Brother   . Diabetes Maternal Grandmother   . Alzheimer's disease Maternal Grandmother   . Breast cancer Paternal Aunt   . Diabetes Maternal Aunt   . Seizures Paternal Uncle    History   Social History  . Marital Status: Married    Spouse Name: N/A    Number of Children: 3  . Years of Education: 14   Occupational History  . Now unemployed     Barrister's clerk at [employer]  .  [employer]   Social History Main Topics  . Smoking status: Current Some Day Smoker -- 0.12 packs/day for 10 years    Types: Cigarettes  . Smokeless tobacco: Never Used     Comment: Smokes 3 cigs pers week / previously smoked 1ppd x 1-2 years; "nothing since ~ 2010" ; "chewed tobacco as a kid" (07/26/2013)  . Alcohol Use: Yes     Comment: 07/26/2013 "dron't drink q wk; only on big sporting events, birthdays, weddings, etc"  . Drug Use: Yes    Special: Marijuana     Comment: 07/26/2013 "smoked weed in high school and college; nothing for years"  . Sexual Activity: Yes   Other Topics Concern  . None   Social History Narrative   Previously worked as  Barrister's clerk and lives at home with his wife. They each have children but none between  the two of them.   Review of Systems: Pertinent items are noted in HPI. Objective:  Physical Exam: Filed Vitals:   07/28/14 1324  BP: 143/67  Pulse: 70  Temp: 97.9 F (36.6 C)  TempSrc: Oral  Height:  (1.702 m)  Weight: 122.154 kg (269 lb 4.8 oz)  SpO2: 98%   General: sitting in chair, NAD HEENT: PERRL, EOMI, no scleral icterus Cardiac: RRR, no rubs, murmurs or gallops Pulm: clear to auscultation bilaterally, moving normal volumes of air Abd: soft, nontender, nondistended, BS present Ext: warm and well perfused, no pedal edema Neuro: alert and oriented X3, cranial nerves II-XII grossly intact   Assessment & Plan:  Please see problem oriented charting  Pt discussed with Dr. Rogelia Boga

## 2014-07-28 NOTE — Assessment & Plan Note (Signed)
BP Readings from Last 3 Encounters:  07/28/14 143/67  04/14/14 117/73  04/10/14 136/80    Lab Results  Component Value Date   NA 137 04/07/2014   K 4.1 04/07/2014   CREATININE 1.43* 04/07/2014    Assessment: Blood pressure control:  continues to be at goal Progress toward BP goal:    Comments: wonderful management and keeping up with lifestyle modifications  Plan: Medications:  continue carvedilol 6.25mg , losartan 25 mg, spironolactone 25mg , and torsemide 20mg   Educational resources provided: brochure Self management tools provided:   Other plans: check lipid panel at next visit

## 2014-07-28 NOTE — Assessment & Plan Note (Signed)
This most likely represents GERD as is relieved by taking Tums and associated with not eating meals. Pt has no warning symptoms of weight loss, odynophagia, or dysphagia to warrant need for immediate imaging. Pt was offered OTC medications and pt declined at this time would like to continue to do symptomatic management and changes in diet.  Wt Readings from Last 3 Encounters:  07/28/14 122.154 kg (269 lb 4.8 oz)  04/14/14 123.605 kg (272 lb 8 oz)  04/10/14 122.471 kg (270 lb)   -pt was offered omeprazole 40mg  qhs acutely this maybe considered if pt continues to have symptoms or complaints

## 2014-07-28 NOTE — Progress Notes (Signed)
Internal Medicine Clinic Attending Date of visit: 07/28/2014   Case discussed with Dr. Sadek soon after the resident saw the patient.  We reviewed the resident's history and exam and pertinent patient test results.  I agree with the assessment, diagnosis, and plan of care documented in the resident's note. 

## 2014-07-29 LAB — MICROALBUMIN / CREATININE URINE RATIO
Creatinine, Urine: 32.1 mg/dL
Microalb Creat Ratio: 44.5 mg/g — ABNORMAL HIGH (ref 0.0–30.0)
Microalb, Ur: 1.43 mg/dL (ref 0.00–1.89)

## 2014-08-01 ENCOUNTER — Telehealth: Payer: Self-pay | Admitting: *Deleted

## 2014-08-01 NOTE — Telephone Encounter (Signed)
Patient left a vm on the refill line requesting a re-order of the viagra. Please advise. Thanks, MI

## 2014-08-02 NOTE — Telephone Encounter (Signed)
[facility] reorder for [facility] [phone]  Patient id # [id number] 08/07/14 will be available for reorder [id number] order number, 7-10 days from 9/14 Will call patient when arrived

## 2014-08-09 ENCOUNTER — Other Ambulatory Visit (HOSPITAL_COMMUNITY): Payer: Self-pay | Admitting: Internal Medicine

## 2014-08-14 ENCOUNTER — Telehealth: Payer: Self-pay

## 2014-08-14 NOTE — Telephone Encounter (Signed)
CALLED PATIENT TO LET HIM KNOW THAT HIS VIAGRA WAS HERE AT THE OFFICE

## 2014-08-30 ENCOUNTER — Encounter: Payer: Self-pay | Admitting: *Deleted

## 2014-08-30 ENCOUNTER — Other Ambulatory Visit: Payer: Self-pay | Admitting: *Deleted

## 2014-08-31 MED ORDER — GABAPENTIN 600 MG PO TABS: 600.0000 mg | ORAL_TABLET | Freq: Two times a day (BID) | ORAL | Status: DC

## 2014-09-04 ENCOUNTER — Other Ambulatory Visit: Payer: Self-pay | Admitting: *Deleted

## 2014-09-04 DIAGNOSIS — M1990 Unspecified osteoarthritis, unspecified site: Secondary | ICD-10-CM

## 2014-09-06 MED ORDER — HYDROCODONE-ACETAMINOPHEN 5-325 MG PO TABS: 1.0000 | ORAL_TABLET | Freq: Four times a day (QID) | ORAL | Status: DC | PRN

## 2014-09-07 NOTE — Telephone Encounter (Signed)
Lm for pt to rtc

## 2014-09-27 ENCOUNTER — Other Ambulatory Visit: Payer: Self-pay | Admitting: Cardiology

## 2014-09-27 ENCOUNTER — Other Ambulatory Visit (HOSPITAL_COMMUNITY): Payer: Self-pay | Admitting: Internal Medicine

## 2014-09-29 ENCOUNTER — Other Ambulatory Visit (HOSPITAL_COMMUNITY): Payer: Self-pay | Admitting: Internal Medicine

## 2014-10-01 ENCOUNTER — Other Ambulatory Visit (HOSPITAL_COMMUNITY): Payer: Self-pay | Admitting: Internal Medicine

## 2014-10-09 ENCOUNTER — Other Ambulatory Visit: Payer: Self-pay | Admitting: Cardiology

## 2014-10-23 ENCOUNTER — Telehealth: Payer: Self-pay | Admitting: *Deleted

## 2014-10-23 ENCOUNTER — Other Ambulatory Visit: Payer: Self-pay | Admitting: *Deleted

## 2014-10-23 DIAGNOSIS — M1990 Unspecified osteoarthritis, unspecified site: Secondary | ICD-10-CM

## 2014-10-23 NOTE — Telephone Encounter (Signed)
Patient requests a reorder of his viagra. Ok to reorder? Please advise. Thanks, MI

## 2014-10-23 NOTE — Telephone Encounter (Signed)
[employer] reorder for [facility] [phone]  Patient id # [id number]  Order number [id number] allow 7-10 days to receive Patient aware

## 2014-10-24 MED ORDER — HYDROCODONE-ACETAMINOPHEN 5-325 MG PO TABS: 1.0000 | ORAL_TABLET | Freq: Four times a day (QID) | ORAL | Status: DC | PRN

## 2014-10-24 NOTE — Telephone Encounter (Signed)
Pt aware.

## 2014-10-31 ENCOUNTER — Other Ambulatory Visit (HOSPITAL_COMMUNITY): Payer: Self-pay | Admitting: Internal Medicine

## 2014-10-31 ENCOUNTER — Telehealth: Payer: Self-pay

## 2014-10-31 NOTE — Telephone Encounter (Signed)
Called patient to let him know that his virga had come from the company

## 2014-11-02 ENCOUNTER — Encounter (HOSPITAL_COMMUNITY): Payer: Self-pay | Admitting: Cardiology

## 2014-11-30 ENCOUNTER — Ambulatory Visit (INDEPENDENT_AMBULATORY_CARE_PROVIDER_SITE_OTHER): Payer: PPO | Admitting: Internal Medicine

## 2014-11-30 ENCOUNTER — Encounter: Payer: Self-pay | Admitting: Internal Medicine

## 2014-11-30 VITALS — BP 132/63 | HR 53 | Temp 98.0°F | Ht 67.0 in | Wt 276.9 lb

## 2014-11-30 DIAGNOSIS — E119 Type 2 diabetes mellitus without complications: Secondary | ICD-10-CM

## 2014-11-30 DIAGNOSIS — N492 Inflammatory disorders of scrotum: Secondary | ICD-10-CM

## 2014-11-30 DIAGNOSIS — M1990 Unspecified osteoarthritis, unspecified site: Secondary | ICD-10-CM

## 2014-11-30 DIAGNOSIS — E118 Type 2 diabetes mellitus with unspecified complications: Secondary | ICD-10-CM

## 2014-11-30 LAB — HM DIABETES EYE EXAM

## 2014-11-30 MED ORDER — HYDROCODONE-ACETAMINOPHEN 5-325 MG PO TABS: 1.0000 | ORAL_TABLET | Freq: Four times a day (QID) | ORAL | Status: DC | PRN

## 2014-11-30 MED ORDER — DOXYCYCLINE HYCLATE 50 MG PO CAPS: 100.0000 mg | ORAL_CAPSULE | Freq: Two times a day (BID) | ORAL | Status: AC

## 2014-11-30 NOTE — Progress Notes (Signed)
   Subjective:    Patient ID: Jeremiah Perkins, male    DOB: May 15, 1961, 54 y.o.   MRN: [id number]  HPI Jeremiah Perkins is a 54 year old man with history of HTN, atrial flutter, DM2 with peripheral neuropathy, abdominal discomfort, and chronic pain presenting with a boil.  He reports developing a boil a couple of years ago that keeps coming back.  However, he says it is typically in a slightly different location.  He denies fevers, but says he feels nauseated and has had some chills.  The boil typically drains on its own, and it has not been incised in the past.  He has seen surgery about possible removal of residual infection in the past, but he has been reluctant to pursue surgery.  He has been on antibiotics multiple times in the past, which have helped to clear his infection.  He says he has been trying to keep the area clean with hibiclense and iodine swabs.  It last filled with up with pus about a year ago.  He denies light-headedness.  He says the boil has been draining red and yellow pus with some component of blood.  He reports having boils on his stomach in the past as well, but he does not have any other boils currently.   Review of Systems  Constitutional: Negative for fever, chills, diaphoresis and fatigue.  HENT: Negative for congestion, rhinorrhea and sore throat.   Respiratory: Negative for chest tightness and shortness of breath.   Cardiovascular: Negative for chest pain.  Gastrointestinal: Positive for nausea. Negative for vomiting, abdominal pain, diarrhea and constipation.  Genitourinary: Negative for dysuria.  Musculoskeletal: Negative for myalgias and arthralgias.  Skin: Positive for pallor.  Neurological: Negative for dizziness, weakness, light-headedness and numbness.       Objective:   Physical Exam  Constitutional: He is oriented to person, place, and time. He appears well-developed and well-nourished. No distress.  HENT:  Head: Normocephalic and atraumatic.    Eyes: Conjunctivae and EOM are normal. Pupils are equal, round, and reactive to light. No scleral icterus.  Cardiovascular: Normal rate, regular rhythm and normal heart sounds.   Pulmonary/Chest: Effort normal and breath sounds normal.  Abdominal: Soft. Bowel sounds are normal. He exhibits no distension. There is no tenderness.  Genitourinary: Penis normal.  Musculoskeletal: Normal range of motion. He exhibits no edema.  Neurological: He is alert and oriented to person, place, and time. No cranial nerve deficit. He exhibits normal muscle tone.  Skin: Skin is warm and dry.  0.5 cm scab in left groin abutting scrotum with associated 2 cm area of subcutaneous thickness.  Not currently draining, no areas of fluctuance appreciated.  Mild tenderness to touch.          Assessment & Plan:  Please see problem-based assessment and plan.

## 2014-11-30 NOTE — Patient Instructions (Signed)
Thank you for coming to clinic today Jeremiah Perkins.  General instructions: -The abscess in your groin has come back. -It appears that it is draining on its own, so I will prescribe you a course of antibiotics to help get rid of the infection. -Please make a follow up appointment to return to clinic in 2 months.  Please try to bring all your medicines next time. This helps [place] take good care of you and stops mistakes from medicines that could hurt you.

## 2014-11-30 NOTE — Progress Notes (Signed)
Internal Medicine Clinic Attending  Case discussed with Dr. Moding at the time of the visit.  We reviewed the resident's history and exam and pertinent patient test results.  I agree with the assessment, diagnosis, and plan of care documented in the resident's note. 

## 2014-11-30 NOTE — Assessment & Plan Note (Addendum)
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  He is reluctant to undergo surgery, but he may need to be referred again if it continues to recur.  No signs of systemic infection other than some chills and nausea, but vital signs are stable.  No fluctuance, so will treat with antibiotics.  He has responded to doxycycline in the past. -Doxycycline 100 mg BID for 10 days. -Consider referral to surgery if it continues to recur or doesn't respond to antibiotics.

## 2014-11-30 NOTE — Assessment & Plan Note (Signed)
Lab Results  Component Value Date   HGBA1C 6.4 07/28/2014   HGBA1C 6.3 03/10/2014   HGBA1C 5.6 11/04/2013     Assessment: Diabetes control: good control (HgbA1C at goal) Progress toward A1C goal:  at goal Comments: Hgb A1C has been very stable.  Every 6 month monitoring is likely warranted.  Plan: Medications:  continue current medications: metformin 100 mg daily. Other plans: Will defer A1C until next PCP appointment, retinal scan today with Lupita Leash.

## 2014-12-05 ENCOUNTER — Encounter: Payer: Self-pay | Admitting: *Deleted

## 2014-12-24 ENCOUNTER — Emergency Department (HOSPITAL_COMMUNITY): Payer: PPO

## 2014-12-24 ENCOUNTER — Encounter (HOSPITAL_COMMUNITY): Payer: Self-pay | Admitting: Cardiology

## 2014-12-24 ENCOUNTER — Emergency Department (HOSPITAL_COMMUNITY)
Admission: EM | Admit: 2014-12-24 | Discharge: 2014-12-24 | Disposition: A | Discharge status: Home / Self Care | Payer: PPO | Attending: Emergency Medicine | Admitting: Emergency Medicine

## 2014-12-24 DIAGNOSIS — F329 Major depressive disorder, single episode, unspecified: Secondary | ICD-10-CM | POA: Insufficient documentation

## 2014-12-24 DIAGNOSIS — I10 Essential (primary) hypertension: Secondary | ICD-10-CM | POA: Diagnosis not present

## 2014-12-24 DIAGNOSIS — Z8701 Personal history of pneumonia (recurrent): Secondary | ICD-10-CM | POA: Diagnosis not present

## 2014-12-24 DIAGNOSIS — E1140 Type 2 diabetes mellitus with diabetic neuropathy, unspecified: Secondary | ICD-10-CM | POA: Insufficient documentation

## 2014-12-24 DIAGNOSIS — G4733 Obstructive sleep apnea (adult) (pediatric): Secondary | ICD-10-CM | POA: Diagnosis not present

## 2014-12-24 DIAGNOSIS — K219 Gastro-esophageal reflux disease without esophagitis: Secondary | ICD-10-CM | POA: Diagnosis not present

## 2014-12-24 DIAGNOSIS — I5022 Chronic systolic (congestive) heart failure: Secondary | ICD-10-CM

## 2014-12-24 DIAGNOSIS — M109 Gout, unspecified: Secondary | ICD-10-CM | POA: Insufficient documentation

## 2014-12-24 DIAGNOSIS — R0789 Other chest pain: Secondary | ICD-10-CM | POA: Diagnosis not present

## 2014-12-24 DIAGNOSIS — F419 Anxiety disorder, unspecified: Secondary | ICD-10-CM | POA: Diagnosis not present

## 2014-12-24 DIAGNOSIS — I509 Heart failure, unspecified: Secondary | ICD-10-CM | POA: Diagnosis not present

## 2014-12-24 DIAGNOSIS — R0602 Shortness of breath: Secondary | ICD-10-CM

## 2014-12-24 DIAGNOSIS — J45901 Unspecified asthma with (acute) exacerbation: Secondary | ICD-10-CM | POA: Insufficient documentation

## 2014-12-24 DIAGNOSIS — Z720 Tobacco use: Secondary | ICD-10-CM | POA: Diagnosis not present

## 2014-12-24 DIAGNOSIS — M1990 Unspecified osteoarthritis, unspecified site: Secondary | ICD-10-CM | POA: Insufficient documentation

## 2014-12-24 DIAGNOSIS — Z9114 Patient's other noncompliance with medication regimen: Secondary | ICD-10-CM

## 2014-12-24 DIAGNOSIS — E785 Hyperlipidemia, unspecified: Secondary | ICD-10-CM | POA: Insufficient documentation

## 2014-12-24 DIAGNOSIS — M546 Pain in thoracic spine: Secondary | ICD-10-CM | POA: Diagnosis not present

## 2014-12-24 DIAGNOSIS — Z79899 Other long term (current) drug therapy: Secondary | ICD-10-CM | POA: Insufficient documentation

## 2014-12-24 DIAGNOSIS — Z9981 Dependence on supplemental oxygen: Secondary | ICD-10-CM | POA: Insufficient documentation

## 2014-12-24 DIAGNOSIS — R079 Chest pain, unspecified: Secondary | ICD-10-CM | POA: Diagnosis present

## 2014-12-24 DIAGNOSIS — Z9119 Patient's noncompliance with other medical treatment and regimen: Secondary | ICD-10-CM | POA: Insufficient documentation

## 2014-12-24 DIAGNOSIS — G8929 Other chronic pain: Secondary | ICD-10-CM | POA: Diagnosis not present

## 2014-12-24 LAB — BASIC METABOLIC PANEL
Anion gap: 9 (ref 5–15)
BUN: 20 mg/dL (ref 6–23)
CALCIUM: 8.8 mg/dL (ref 8.4–10.5)
CHLORIDE: 108 mmol/L (ref 96–112)
CO2: 24 mmol/L (ref 19–32)
Creatinine, Ser: 1.15 mg/dL (ref 0.50–1.35)
GFR calc Af Amer: 81 mL/min — ABNORMAL LOW (ref >90)
GFR calc non Af Amer: 70 mL/min — ABNORMAL LOW (ref >90)
Glucose, Bld: 143 mg/dL — ABNORMAL HIGH (ref 70–99)
Potassium: 4.1 mmol/L (ref 3.5–5.1)
Sodium: 141 mmol/L (ref 135–145)

## 2014-12-24 LAB — CBC
HCT: 41 % (ref 39.0–52.0)
Hemoglobin: 13.6 g/dL (ref 13.0–17.0)
MCH: 29.8 pg (ref 26.0–34.0)
MCHC: 33.2 g/dL (ref 30.0–36.0)
MCV: 89.9 fL (ref 78.0–100.0)
PLATELETS: 243 10*3/uL (ref 150–400)
RBC: 4.56 MIL/uL (ref 4.22–5.81)
RDW: 14.2 % (ref 11.5–15.5)
WBC: 5.6 10*3/uL (ref 4.0–10.5)

## 2014-12-24 LAB — I-STAT TROPONIN, ED: Troponin i, poc: 0.03 ng/mL (ref 0.00–0.08)

## 2014-12-24 LAB — BRAIN NATRIURETIC PEPTIDE: B NATRIURETIC PEPTIDE 5: 261.1 pg/mL — AB (ref 0.0–100.0)

## 2014-12-24 MED ORDER — TORSEMIDE 20 MG PO TABS
ORAL_TABLET | ORAL | Status: DC
Start: 2014-12-24 — End: 2015-01-03

## 2014-12-24 MED ORDER — FAMOTIDINE 20 MG PO TABS: 20.0000 mg | ORAL_TABLET | Freq: Two times a day (BID) | ORAL | Status: DC

## 2014-12-24 MED ORDER — GI COCKTAIL ~~LOC~~
30.0000 mL | Freq: Once | ORAL | Status: AC
  Administered 2014-12-24: 30 mL via ORAL
  Filled 2014-12-24: qty 30

## 2014-12-24 MED ORDER — ASPIRIN 325 MG PO TABS
325.0000 mg | ORAL_TABLET | Freq: Once | ORAL | Status: AC
  Administered 2014-12-24: 325 mg via ORAL
  Filled 2014-12-24: qty 1

## 2014-12-24 MED ORDER — FUROSEMIDE 10 MG/ML IJ SOLN
80.0000 mg | Freq: Once | INTRAMUSCULAR | Status: AC
Start: 2014-12-24 — End: 2014-12-24
  Administered 2014-12-24: 80 mg via INTRAVENOUS
  Filled 2014-12-24: qty 8

## 2014-12-24 NOTE — ED Notes (Signed)
Pt comfortable with discharge and follow up instructions. Prescriptions x2. 

## 2014-12-24 NOTE — ED Notes (Signed)
Patient transported to X-ray 

## 2014-12-24 NOTE — ED Notes (Signed)
Pt reports he has felt congested and SOB and had chest pain for the past couple of days. States he has a hx of a-fib and Patty Sermons is his cardiologist.

## 2014-12-24 NOTE — ED Provider Notes (Signed)
CSN: [id number]     Arrival date & time 12/24/14  4098 History   First MD Initiated Contact with Patient 12/24/14 [phone]     Chief Complaint  Patient presents with  . Chest Pain     (Consider location/radiation/quality/duration/timing/severity/associated sxs/prior Treatment) HPI Pt is a 53yo morbidly obese male with hx of non-ischemic cardiomyopathy, HTN, atrial flutter, type II DM, SOB, and GERD, presenting to ED with c/o feeling congested with gradually worsening SOB with associated generalized weakness and fatigue. Symptoms started 3-4 days ago, initially felt a sharp pain in the center of his chest, radiate to the left side under his left arm and into the middle of his back.  Pain is described as a discomfort "kind of like indigestion" pain is 4/10 at worst. Discomfort has been constant since onset 3-4 days ago. Reports hx of GERD, was on protonix however, was doing better, was taken off the medication but pt state he believes he might need to start it again. Reports taking Xarelto for intermittent atrial flutter.  States his doctors were able to get him back into a normal rhythm but he is still on the blood thinner. He also takes 80mg -160mg  lasix daily depending on his water weight. State he has not taken any of his medication today.  Denies weighing himself recently but states clothes fit as usually and he has not noticed any increased swelling in legs.  Does admit SOB is usually his first sign of retaining water.  Denies fever, n/v/d. Denies diaphoresis.   Cardiologist: Dr. Patty Sermons PCP: Internal medicine, Dr. Burtis Junes  Past Medical History  Diagnosis Date  . Hypertension   . Congestive heart failure     No echo data available. Myocardial perfusion 2005 with EF 61%, presumed diastolic HF   . Diverticulosis     with history of diverticulitis in 10/2011  . Gout   . Major depression   . Anxiety   . Psychosexual dysfunction with inhibited sexual excitement   . Hyperlipidemia   . Atrial  flutter   . Morbid obesity   . Diabetic peripheral neuropathy   . Type II diabetes mellitus dx'd 1991    previously on insulin in 2000 for 3-4 years (but was stopped because adamantly did not want to be on insulin)  . Chronic bronchitis     "get it q yr" (07/26/2013)  . Shortness of breath     "all the time" (07/26/2013)  . OSA (obstructive sleep apnea) 1998    previously on CPAP - unable to tolerate; "going to purchase one next week" (07/26/2013)  . Arthritis     involving knees, ankles, back, elbows  . Chronic back pain   . GERD (gastroesophageal reflux disease)   . Asthma   . Pneumonia    Past Surgical History  Procedure Laterality Date  . Ankle reconstruction Right 1990's    2/2 trauma sustained after fall  . 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 Cyst removal leg Left 1960's    back of left leg  . Cardiac catheterization  1990's  . Tee without cardioversion N/A 07/28/2013    Procedure: TRANSESOPHAGEAL ECHOCARDIOGRAM (TEE);  Surgeon: Laurey Morale, MD;  Location: [facility] ENDOSCOPY;  Service: Cardiovascular;  Laterality: N/A;  . Cardioversion N/A 07/28/2013    Procedure: CARDIOVERSION;  Surgeon: Laurey Morale, MD;  Location[facility] ENDOSCOPY;  Service: Cardiovascular;  Laterality: N/A;  . Cardioversion N/A 10/26/2013    Procedure: CARDIOVERSION;  Surgeon: Cassell Clement, MD;  Location:  [facility] ENDOSCOPY;  Service: Cardiovascular;  Laterality: N/A;  . Left heart catheterization with coronary angiogram N/A 07/27/2013    Procedure: LEFT HEART CATHETERIZATION WITH CORONARY ANGIOGRAM;  Surgeon: Peter M [place], MD;  Location: [facility] CATH LAB;  Service: Cardiovascular;  Laterality: N/A;   Family History  Problem Relation Age of Onset  . Diabetes Mother   . CAD Mother 7    requiring quadruple bypass  . Hypertension Mother   . Alzheimer's disease Mother   . Stomach cancer Father   . Lung cancer Father     was a smoker  . Gout Father   . CAD Brother 48    requiring quadruple bypass  . Seizures  Brother   . Hypertension Brother   . Diabetes Maternal Grandmother   . Alzheimer's disease Maternal Grandmother   . Breast cancer Paternal Aunt   . Diabetes Maternal Aunt   . Seizures Paternal Uncle    History  Substance Use Topics  . Smoking status: Current Some Day Smoker -- 0.12 packs/day for 10 years    Types: Cigarettes  . Smokeless tobacco: Never Used     Comment: Smokes 3 cigs pers week / previously smoked 1ppd x 1-2 years; "nothing since ~ 2010" ; "chewed tobacco as a kid" (07/26/2013)  . Alcohol Use: 0.0 oz/week    0 Not specified per week     Comment: 07/26/2013 "dron't drink q wk; only on big sporting events, birthdays, weddings, etc"    Review of Systems  Constitutional: Positive for fatigue. Negative for fever, chills, diaphoresis and appetite change.  HENT: Positive for congestion.   Respiratory: Positive for shortness of breath. Negative for cough.   Cardiovascular: Positive for chest pain (central to left side). Negative for palpitations and leg swelling.  Gastrointestinal: Negative for nausea, vomiting, abdominal pain and diarrhea.  Musculoskeletal: Positive for back pain ( mid-back between shoulder blades).  Neurological: Positive for weakness ( generalized).  All other systems reviewed and are negative.     Allergies  Eggs or egg-derived products and Sulfa antibiotics  Home Medications   Prior to Admission medications   Medication Sig Start Date End Date Taking? Authorizing Provider  allopurinol (ZYLOPRIM) 300 MG tablet Take 1 tablet (300 mg total) by mouth daily. 10/07/13  Yes Lorretta Harp, MD  amiodarone (PACERONE) 200 MG tablet TAKE 1 TABLET (200 MG TOTAL) BY MOUTH DAILY. 10/02/14  Yes Cassell Clement, MD  aspirin-sod bicarb-citric acid (ALKA-SELTZER) 325 MG TBEF tablet Take 325 mg by mouth every 6 (six) hours as needed (heartburn).   Yes Historical Provider, MD  Benzalkonium Chloride 50 % SOLN Clean scrotal area daily. 04/10/14  Yes Avel Peace, MD   bismuth subsalicylate (PEPTO BISMOL) 262 MG/15ML suspension Take 30 mLs by mouth every 6 (six) hours as needed for indigestion.   Yes Historical Provider, MD  carvedilol (COREG) 6.25 MG tablet TAKE 1 TABLET (6.25 MG TOTAL) BY MOUTH 2 (TWO) TIMES DAILY WITH A MEAL. 06/26/14  Yes Aundria Rud, NP  docusate sodium (COLACE) 100 MG capsule Take 100 mg by mouth daily as needed for mild constipation. For constipation   Yes Historical Provider, MD  gabapentin (NEURONTIN) 600 MG tablet Take 1 tablet (600 mg total) by mouth 2 (two) times daily. Takes 600mg  in the am & 1200mg  in the pm 08/31/14  Yes Christen Bame, MD  HYDROcodone-acetaminophen (NORCO/VICODIN) 5-325 MG per tablet Take 1-2 tablets by mouth every 6 (six) hours as needed for moderate pain. 11/30/14  Yes Luisa Dago, MD  ibuprofen (ADVIL,MOTRIN) 200 MG tablet Take 400 mg by mouth every 6 (six) hours as needed for mild pain or moderate pain.   Yes Historical Provider, MD  levalbuterol [facility] HFA) 45 MCG/ACT inhaler Inhale 1-2 puffs into the lungs every 4 (four) hours as needed for wheezing. 07/31/13  Yes Roger A Arguello, PA-C  metFORMIN (GLUCOPHAGE) 1000 MG tablet Take 1 tablet (1,000 mg total) by mouth daily with breakfast. 07/28/14 07/28/15 Yes Christen Bame, MD  sildenafil (VIAGRA) 100 MG tablet Take 1 tablet (100 mg total) by mouth daily as needed for erectile dysfunction (pt gets from [employer] note 01/24/14). 05/05/14  Yes Christen Bame, MD  spironolactone (ALDACTONE) 25 MG tablet TAKE 1/2 TABLET (12.5 MG TOTAL) BY MOUTH DAILY. 11/01/14  Yes Bevelyn Buckles Bensimhon, MD  XARELTO 20 MG TABS tablet TAKE 1 TABLET (20 MG TOTAL) BY MOUTH DAILY WITH SUPPER. 09/29/14  Yes Marinus Maw, MD  atorvastatin (LIPITOR) 40 MG tablet Take 1 tablet (40 mg total) by mouth daily. Patient not taking: Reported on 12/24/2014 07/31/13   Roger A Arguello, PA-C  famotidine (PEPCID) 20 MG tablet Take 1 tablet (20 mg total) by mouth 2 (two) times daily. 12/24/14   Junius Finner, PA-C  losartan  (COZAAR) 25 MG tablet TAKE 1 TABLET (25 MG TOTAL) BY MOUTH DAILY. Patient not taking: Reported on 12/24/2014 11/16/13   Cassell Clement, MD  Multiple Vitamins-Minerals (MEGA MULTIVITAMIN FOR MEN) TABS Take 1 capsule by mouth every morning. Patient not taking: Reported on 12/24/2014 01/18/13   Elyse Jarvis, MD  torsemide (DEMADEX) 20 MG tablet Take 2 tablets (40 mg total) by mouth 2 (two) times daily. Patient not taking: Reported on 12/24/2014 05/19/14   Cassell Clement, MD  torsemide (DEMADEX) 20 MG tablet TAKE 80 MG (4 TABLETS) DAILY OR AS INSTRUCTED BY YOUR PROVIDER. 12/24/14   Junius Finner, PA-C   BP 127/68 mmHg  Pulse 54  Temp(Src) 97.9 F (36.6 C) (Oral)  Resp 16  Ht  (1.702 m)  Wt 273 lb (123.832 kg)  BMI 42.75 kg/m2  SpO2 95% Physical Exam  Constitutional: He appears well-developed and well-nourished.  Morbidly obese male sitting on side of exam bed. NAD  HENT:  Head: Normocephalic and atraumatic.  Eyes: Conjunctivae are normal. No scleral icterus.  Neck: Normal range of motion.  Cardiovascular: Normal rate, regular rhythm and normal heart sounds.   Pulmonary/Chest: Effort normal. No respiratory distress. He has wheezes. He has no rales. He exhibits no tenderness.  Moderate SOB while having pt sit up in bed for lung exam. Pt became winded.  Speaking in short sentences but no accessory muscle use. Faint expiratory wheeze in anterior upper lung fields.  Abdominal: Soft. Bowel sounds are normal. He exhibits no distension and no mass. There is no tenderness. There is no rebound and no guarding.  Musculoskeletal: Normal range of motion. He exhibits no edema.  Neurological: He is alert.  Skin: Skin is warm and dry.  Nursing note and vitals reviewed.   ED Course  Procedures (including critical care time) Labs Review Labs Reviewed  BASIC METABOLIC PANEL - Abnormal; Notable for the following:    Glucose, Bld 143 (*)    GFR calc non Af Amer 70 (*)    GFR calc Af Amer 81 (*)     All other components within normal limits  BRAIN NATRIURETIC PEPTIDE - Abnormal; Notable for the following:    B Natriuretic Peptide 261.1 (*)    All other components within normal limits  CBC  I-STAT TROPOININ, ED    Imaging Review Dg Chest 2 View  12/24/2014   CLINICAL DATA:  Pt states that she is having left sided chest pains and weakness for the past 3 days, hx of CHF  EXAM: CHEST  2 VIEW  COMPARISON:  07/26/2013  FINDINGS: Cardiac silhouette mildly enlarged. No mediastinal or hilar masses. Is bilateral interstitial thickening including mild thickening of the oblique fissures. No areas of focal lung consolidation. No pleural effusion or pneumothorax.  Bony thorax is demineralized but grossly intact.  IMPRESSION: 1. Interstitial thickening with mild cardiomegaly supports mild congestive heart failure. No evidence of pneumonia.   Electronically Signed   By: Amie Portland M.D.   On: 12/24/2014 09:47     EKG Interpretation   Date/Time:  Sunday December 24 2014 08:59:27 EST Ventricular Rate:  54 PR Interval:  214 QRS Duration: 86 QT Interval:  466 QTC Calculation: 441 R Axis:   47 Text Interpretation:  Sinus bradycardia with 1st degree A-V block Left  anterior fasicular block Low voltage QRS No significant change since last  tracing Confirmed by Denton Lank  MD, Caryn Bee ([id number]) on 12/24/2014 9:21:53 AM      MDM   Final diagnoses:  SOB (shortness of breath)  Other chest pain  Non compliance w medication regimen  Gastroesophageal reflux disease, esophagitis presence not specified  Congestive heart failure, unspecified congestive heart failure chronicity, unspecified congestive heart failure type    Pt is a 54yo male with hx of 30-35% nonischemic cardiomyopathy, presenting to ED with c/o worsening SOB.  Pt has not taken his medication for 2-3 days.  Pt is SOB, however, O2 is 98% on RA.  EKG: consistent with previous. CXR: significant for mild CHF, no evidence of pneumonia.  BNP: 261, no  previous to compare.  Pt states GI cocktail did help improve his chest discomfort. Requesting medication for his GERD.  Will advise pt to take extra dose of lasix this evening. Will consult with cardiology to ensure pt has close f/u.   Consulted with cardiology who agrees pt may be discharged home and strongly encourage pt to take his medications as prescribed. Pt advised to call office to schedule f/u appointment.  Pt requested to have a refill of his Torsemide as he was prescribed this initially by Dr. Patty Sermons but had to change to furosemide as his insurance would not cover it.  States he now has a Risk manager.  Will refill Torsemide. Advised to f/u with Dr. Patty Sermons as well as his PCP.  Return precautions provided. Pt verbalized understanding and agreement with tx plan.     Junius Finner, PA-C 12/24/14 1248  Suzi Roots, MD 12/25/14 [id number]

## 2014-12-24 NOTE — ED Notes (Signed)
Patient returned from X-ray 

## 2014-12-25 ENCOUNTER — Other Ambulatory Visit: Payer: Self-pay | Admitting: *Deleted

## 2014-12-25 ENCOUNTER — Other Ambulatory Visit (HOSPITAL_COMMUNITY): Payer: Self-pay | Admitting: Cardiology

## 2014-12-25 NOTE — Telephone Encounter (Signed)
Patient call about getting his viagra from [employer] I called and order it. Order #[id number]

## 2014-12-26 MED ORDER — BENZALKONIUM CHLORIDE 50 % EX SOLN: CUTANEOUS | Status: DC

## 2014-12-28 ENCOUNTER — Encounter: Payer: PPO | Admitting: Cardiology

## 2015-01-01 ENCOUNTER — Telehealth: Payer: Self-pay

## 2015-01-01 ENCOUNTER — Other Ambulatory Visit (HOSPITAL_COMMUNITY): Payer: Self-pay | Admitting: Anesthesiology

## 2015-01-01 NOTE — Telephone Encounter (Signed)
Called patient to let him know that his Viagra was here at the opffice

## 2015-01-02 ENCOUNTER — Encounter: Payer: Self-pay | Admitting: Internal Medicine

## 2015-01-02 ENCOUNTER — Ambulatory Visit (HOSPITAL_COMMUNITY)
Admission: RE | Admit: 2015-01-02 | Discharge: 2015-01-02 | Disposition: A | Discharge status: Home / Self Care | Payer: PPO | Source: Ambulatory Visit | Attending: Internal Medicine | Admitting: Internal Medicine

## 2015-01-02 ENCOUNTER — Inpatient Hospital Stay (HOSPITAL_COMMUNITY): Payer: PPO

## 2015-01-02 ENCOUNTER — Inpatient Hospital Stay (HOSPITAL_COMMUNITY)
Admission: AD | Admit: 2015-01-02 | Discharge: 2015-01-05 | DRG: 189 | Disposition: A | Discharge status: Home / Self Care | Payer: PPO | Source: Ambulatory Visit | Attending: Internal Medicine | Admitting: Internal Medicine

## 2015-01-02 ENCOUNTER — Encounter (HOSPITAL_COMMUNITY): Payer: Self-pay | Admitting: General Practice

## 2015-01-02 ENCOUNTER — Ambulatory Visit (INDEPENDENT_AMBULATORY_CARE_PROVIDER_SITE_OTHER): Payer: PPO | Admitting: Internal Medicine

## 2015-01-02 ENCOUNTER — Other Ambulatory Visit: Payer: Self-pay

## 2015-01-02 VITALS — BP 139/59 | HR 59 | Temp 97.8°F | Ht 67.0 in | Wt 283.4 lb

## 2015-01-02 DIAGNOSIS — E1142 Type 2 diabetes mellitus with diabetic polyneuropathy: Secondary | ICD-10-CM

## 2015-01-02 DIAGNOSIS — E039 Hypothyroidism, unspecified: Secondary | ICD-10-CM | POA: Diagnosis present

## 2015-01-02 DIAGNOSIS — N183 Chronic kidney disease, stage 3 unspecified: Secondary | ICD-10-CM | POA: Diagnosis present

## 2015-01-02 DIAGNOSIS — N179 Acute kidney failure, unspecified: Secondary | ICD-10-CM | POA: Diagnosis present

## 2015-01-02 DIAGNOSIS — I5032 Chronic diastolic (congestive) heart failure: Secondary | ICD-10-CM | POA: Diagnosis present

## 2015-01-02 DIAGNOSIS — E118 Type 2 diabetes mellitus with unspecified complications: Secondary | ICD-10-CM | POA: Diagnosis present

## 2015-01-02 DIAGNOSIS — M769 Unspecified enthesopathy, lower limb, excluding foot: Secondary | ICD-10-CM | POA: Diagnosis present

## 2015-01-02 DIAGNOSIS — Z89022 Acquired absence of left finger(s): Secondary | ICD-10-CM

## 2015-01-02 DIAGNOSIS — F17210 Nicotine dependence, cigarettes, uncomplicated: Secondary | ICD-10-CM | POA: Diagnosis present

## 2015-01-02 DIAGNOSIS — N189 Chronic kidney disease, unspecified: Secondary | ICD-10-CM | POA: Diagnosis present

## 2015-01-02 DIAGNOSIS — J9620 Acute and chronic respiratory failure, unspecified whether with hypoxia or hypercapnia: Secondary | ICD-10-CM | POA: Diagnosis present

## 2015-01-02 DIAGNOSIS — E038 Other specified hypothyroidism: Secondary | ICD-10-CM | POA: Diagnosis present

## 2015-01-02 DIAGNOSIS — M25561 Pain in right knee: Secondary | ICD-10-CM | POA: Diagnosis present

## 2015-01-02 DIAGNOSIS — M1990 Unspecified osteoarthritis, unspecified site: Secondary | ICD-10-CM | POA: Diagnosis present

## 2015-01-02 DIAGNOSIS — Z91012 Allergy to eggs: Secondary | ICD-10-CM | POA: Diagnosis not present

## 2015-01-02 DIAGNOSIS — I4892 Unspecified atrial flutter: Secondary | ICD-10-CM | POA: Diagnosis present

## 2015-01-02 DIAGNOSIS — I129 Hypertensive chronic kidney disease with stage 1 through stage 4 chronic kidney disease, or unspecified chronic kidney disease: Secondary | ICD-10-CM | POA: Diagnosis present

## 2015-01-02 DIAGNOSIS — G4733 Obstructive sleep apnea (adult) (pediatric): Secondary | ICD-10-CM | POA: Diagnosis present

## 2015-01-02 DIAGNOSIS — I5023 Acute on chronic systolic (congestive) heart failure: Secondary | ICD-10-CM

## 2015-01-02 DIAGNOSIS — Z882 Allergy status to sulfonamides status: Secondary | ICD-10-CM | POA: Diagnosis not present

## 2015-01-02 DIAGNOSIS — F418 Other specified anxiety disorders: Secondary | ICD-10-CM | POA: Diagnosis present

## 2015-01-02 DIAGNOSIS — K219 Gastro-esophageal reflux disease without esophagitis: Secondary | ICD-10-CM | POA: Diagnosis present

## 2015-01-02 DIAGNOSIS — K5900 Constipation, unspecified: Secondary | ICD-10-CM | POA: Diagnosis present

## 2015-01-02 DIAGNOSIS — Z7901 Long term (current) use of anticoagulants: Secondary | ICD-10-CM

## 2015-01-02 DIAGNOSIS — I10 Essential (primary) hypertension: Secondary | ICD-10-CM | POA: Diagnosis present

## 2015-01-02 DIAGNOSIS — M109 Gout, unspecified: Secondary | ICD-10-CM | POA: Diagnosis present

## 2015-01-02 DIAGNOSIS — R0602 Shortness of breath: Secondary | ICD-10-CM | POA: Diagnosis present

## 2015-01-02 DIAGNOSIS — I272 Other secondary pulmonary hypertension: Secondary | ICD-10-CM | POA: Diagnosis present

## 2015-01-02 DIAGNOSIS — I509 Heart failure, unspecified: Secondary | ICD-10-CM

## 2015-01-02 DIAGNOSIS — G4700 Insomnia, unspecified: Secondary | ICD-10-CM | POA: Diagnosis present

## 2015-01-02 DIAGNOSIS — J45909 Unspecified asthma, uncomplicated: Secondary | ICD-10-CM | POA: Diagnosis present

## 2015-01-02 DIAGNOSIS — Z6841 Body Mass Index (BMI) 40.0 and over, adult: Secondary | ICD-10-CM

## 2015-01-02 DIAGNOSIS — E785 Hyperlipidemia, unspecified: Secondary | ICD-10-CM | POA: Diagnosis present

## 2015-01-02 DIAGNOSIS — J849 Interstitial pulmonary disease, unspecified: Secondary | ICD-10-CM

## 2015-01-02 HISTORY — DX: Acute myocardial infarction, unspecified: I21.9

## 2015-01-02 LAB — CBC WITH DIFFERENTIAL/PLATELET
BASOS PCT: 0 % (ref 0–1)
Basophils Absolute: 0 10*3/uL (ref 0.0–0.1)
EOS ABS: 0.1 10*3/uL (ref 0.0–0.7)
Eosinophils Relative: 2 % (ref 0–5)
HCT: 43.4 % (ref 39.0–52.0)
Hemoglobin: 14.3 g/dL (ref 13.0–17.0)
Lymphocytes Relative: 22 % (ref 12–46)
Lymphs Abs: 1.4 10*3/uL (ref 0.7–4.0)
MCH: 30 pg (ref 26.0–34.0)
MCHC: 32.9 g/dL (ref 30.0–36.0)
MCV: 91 fL (ref 78.0–100.0)
Monocytes Absolute: 0.4 10*3/uL (ref 0.1–1.0)
Monocytes Relative: 5 % (ref 3–12)
NEUTROS PCT: 71 % (ref 43–77)
Neutro Abs: 4.6 10*3/uL (ref 1.7–7.7)
PLATELETS: 290 10*3/uL (ref 150–400)
RBC: 4.77 MIL/uL (ref 4.22–5.81)
RDW: 14.1 % (ref 11.5–15.5)
WBC: 6.5 10*3/uL (ref 4.0–10.5)

## 2015-01-02 LAB — BASIC METABOLIC PANEL
Anion gap: 12 (ref 5–15)
BUN: 39 mg/dL — ABNORMAL HIGH (ref 6–23)
CO2: 26 mmol/L (ref 19–32)
Calcium: 9.1 mg/dL (ref 8.4–10.5)
Chloride: 101 mmol/L (ref 96–112)
Creatinine, Ser: 1.51 mg/dL — ABNORMAL HIGH (ref 0.50–1.35)
GFR calc Af Amer: 58 mL/min — ABNORMAL LOW (ref >90)
GFR, EST NON AFRICAN AMERICAN: 50 mL/min — AB (ref >90)
Glucose, Bld: 125 mg/dL — ABNORMAL HIGH (ref 70–99)
Potassium: 3.8 mmol/L (ref 3.5–5.1)
SODIUM: 139 mmol/L (ref 135–145)

## 2015-01-02 LAB — GLUCOSE, CAPILLARY: GLUCOSE-CAPILLARY: 177 mg/dL — AB (ref 70–99)

## 2015-01-02 LAB — BRAIN NATRIURETIC PEPTIDE: B NATRIURETIC PEPTIDE 5: 237.2 pg/mL — AB (ref 0.0–100.0)

## 2015-01-02 LAB — TROPONIN I: Troponin I: 0.06 ng/mL — ABNORMAL HIGH (ref <0.031)

## 2015-01-02 MED ORDER — PANTOPRAZOLE SODIUM 40 MG PO TBEC
40.0000 mg | DELAYED_RELEASE_TABLET | Freq: Every day | ORAL | Status: DC
  Administered 2015-01-02 – 2015-01-05 (×4): 40 mg via ORAL
  Filled 2015-01-02 (×4): qty 1

## 2015-01-02 MED ORDER — DOCUSATE SODIUM 100 MG PO CAPS: 100.0000 mg | ORAL_CAPSULE | Freq: Every day | ORAL | Status: DC | PRN

## 2015-01-02 MED ORDER — ALLOPURINOL 300 MG PO TABS
300.0000 mg | ORAL_TABLET | Freq: Every day | ORAL | Status: DC
  Administered 2015-01-02: 300 mg via ORAL
  Filled 2015-01-02: qty 1

## 2015-01-02 MED ORDER — LEVALBUTEROL HCL 0.63 MG/3ML IN NEBU: 0.6300 mg | INHALATION_SOLUTION | RESPIRATORY_TRACT | Status: DC | PRN

## 2015-01-02 MED ORDER — SPIRONOLACTONE 12.5 MG HALF TABLET
12.5000 mg | ORAL_TABLET | Freq: Every day | ORAL | Status: DC
  Administered 2015-01-03 – 2015-01-05 (×3): 12.5 mg via ORAL
  Filled 2015-01-02 (×3): qty 1

## 2015-01-02 MED ORDER — TRAZODONE HCL 50 MG PO TABS
50.0000 mg | ORAL_TABLET | Freq: Every day | ORAL | Status: DC
  Administered 2015-01-02 – 2015-01-04 (×3): 50 mg via ORAL
  Filled 2015-01-02 (×4): qty 1

## 2015-01-02 MED ORDER — DOCUSATE SODIUM 100 MG PO CAPS
100.0000 mg | ORAL_CAPSULE | Freq: Two times a day (BID) | ORAL | Status: DC
  Administered 2015-01-02 – 2015-01-05 (×6): 100 mg via ORAL
  Filled 2015-01-02 (×3): qty 1

## 2015-01-02 MED ORDER — ACETAMINOPHEN 650 MG RE SUPP: 650.0000 mg | Freq: Four times a day (QID) | RECTAL | Status: DC | PRN

## 2015-01-02 MED ORDER — TORSEMIDE 20 MG PO TABS
40.0000 mg | ORAL_TABLET | Freq: Two times a day (BID) | ORAL | Status: DC
  Filled 2015-01-02 (×2): qty 2

## 2015-01-02 MED ORDER — SODIUM CHLORIDE 0.9 % IJ SOLN: 3.0000 mL | INTRAMUSCULAR | Status: DC | PRN

## 2015-01-02 MED ORDER — ACETAMINOPHEN 325 MG PO TABS: 650.0000 mg | ORAL_TABLET | Freq: Four times a day (QID) | ORAL | Status: DC | PRN

## 2015-01-02 MED ORDER — ONDANSETRON HCL 4 MG PO TABS
4.0000 mg | ORAL_TABLET | Freq: Four times a day (QID) | ORAL | Status: DC | PRN
Start: 2015-01-02 — End: 2015-01-05

## 2015-01-02 MED ORDER — ATORVASTATIN CALCIUM 40 MG PO TABS
40.0000 mg | ORAL_TABLET | Freq: Every day | ORAL | Status: DC
  Administered 2015-01-03 – 2015-01-04 (×2): 40 mg via ORAL
  Filled 2015-01-02 (×3): qty 1

## 2015-01-02 MED ORDER — ONDANSETRON HCL 4 MG/2ML IJ SOLN: 4.0000 mg | Freq: Four times a day (QID) | INTRAMUSCULAR | Status: DC | PRN

## 2015-01-02 MED ORDER — GABAPENTIN 300 MG PO CAPS
600.0000 mg | ORAL_CAPSULE | Freq: Two times a day (BID) | ORAL | Status: DC
  Administered 2015-01-02 – 2015-01-05 (×6): 600 mg via ORAL
  Filled 2015-01-02 (×7): qty 2

## 2015-01-02 MED ORDER — ALBUTEROL SULFATE HFA 108 (90 BASE) MCG/ACT IN AERS: 1.0000 | INHALATION_SPRAY | Freq: Four times a day (QID) | RESPIRATORY_TRACT | Status: DC | PRN

## 2015-01-02 MED ORDER — LEVALBUTEROL TARTRATE 45 MCG/ACT IN AERO: 1.0000 | INHALATION_SPRAY | RESPIRATORY_TRACT | Status: DC | PRN

## 2015-01-02 MED ORDER — RIVAROXABAN 20 MG PO TABS
20.0000 mg | ORAL_TABLET | Freq: Every day | ORAL | Status: DC
  Administered 2015-01-03 – 2015-01-04 (×2): 20 mg via ORAL
  Filled 2015-01-02 (×3): qty 1

## 2015-01-02 MED ORDER — SODIUM CHLORIDE 0.9 % IV SOLN: 250.0000 mL | INTRAVENOUS | Status: DC | PRN

## 2015-01-02 MED ORDER — FAMOTIDINE 20 MG PO TABS
20.0000 mg | ORAL_TABLET | Freq: Two times a day (BID) | ORAL | Status: DC
  Administered 2015-01-02: 20 mg via ORAL
  Filled 2015-01-02: qty 1

## 2015-01-02 MED ORDER — INSULIN ASPART 100 UNIT/ML ~~LOC~~ SOLN
0.0000 [IU] | Freq: Three times a day (TID) | SUBCUTANEOUS | Status: DC
Start: 2015-01-03 — End: 2015-01-05
  Administered 2015-01-03: 2 [IU] via SUBCUTANEOUS
  Administered 2015-01-03: 1 [IU] via SUBCUTANEOUS
  Administered 2015-01-04 (×2): 2 [IU] via SUBCUTANEOUS
  Administered 2015-01-05 (×2): 1 [IU] via SUBCUTANEOUS

## 2015-01-02 MED ORDER — AMIODARONE HCL 200 MG PO TABS
200.0000 mg | ORAL_TABLET | Freq: Every day | ORAL | Status: DC
  Administered 2015-01-03 – 2015-01-05 (×3): 200 mg via ORAL
  Filled 2015-01-02 (×3): qty 1

## 2015-01-02 MED ORDER — HYDROCODONE-ACETAMINOPHEN 5-325 MG PO TABS
1.0000 | ORAL_TABLET | Freq: Four times a day (QID) | ORAL | Status: DC | PRN
  Administered 2015-01-02 – 2015-01-05 (×7): 2 via ORAL
  Filled 2015-01-02 (×7): qty 2

## 2015-01-02 MED ORDER — ALBUTEROL SULFATE (2.5 MG/3ML) 0.083% IN NEBU
2.5000 mg | INHALATION_SOLUTION | Freq: Four times a day (QID) | RESPIRATORY_TRACT | Status: DC | PRN
  Administered 2015-01-03: 2.5 mg via RESPIRATORY_TRACT

## 2015-01-02 MED ORDER — SODIUM CHLORIDE 0.9 % IJ SOLN
3.0000 mL | Freq: Two times a day (BID) | INTRAMUSCULAR | Status: DC
  Administered 2015-01-02 – 2015-01-05 (×5): 3 mL via INTRAVENOUS

## 2015-01-02 MED ORDER — GABAPENTIN 600 MG PO TABS: 600.0000 mg | ORAL_TABLET | Freq: Two times a day (BID) | ORAL | Status: DC

## 2015-01-02 MED ORDER — FUROSEMIDE 10 MG/ML IJ SOLN
40.0000 mg | Freq: Two times a day (BID) | INTRAMUSCULAR | Status: DC
  Administered 2015-01-02: 40 mg via INTRAVENOUS
  Filled 2015-01-02 (×3): qty 4

## 2015-01-02 MED ORDER — POLYETHYLENE GLYCOL 3350 17 G PO PACK
17.0000 g | PACK | Freq: Every day | ORAL | Status: DC | PRN
  Administered 2015-01-04 (×2): 17 g via ORAL
  Filled 2015-01-02 (×4): qty 1

## 2015-01-02 MED ORDER — CARVEDILOL 6.25 MG PO TABS
6.2500 mg | ORAL_TABLET | Freq: Two times a day (BID) | ORAL | Status: DC
  Filled 2015-01-02 (×3): qty 1

## 2015-01-02 MED ORDER — SODIUM CHLORIDE 0.9 % IJ SOLN
3.0000 mL | Freq: Two times a day (BID) | INTRAMUSCULAR | Status: DC
  Administered 2015-01-03 – 2015-01-04 (×2): 3 mL via INTRAVENOUS

## 2015-01-02 NOTE — Progress Notes (Signed)
Subjective:   Patient ID: Jeremiah Perkins male   DOB: Apr 07, 1961 54 y.o.   MRN: [id number]  HPI: [name] is a 54 y.o. man pmh as listed below presents for chest discomfort.   Pt states this has been ongoing since 12/24/14 when he presented to the ED with similar complaints. The patient has been having trouble obtaining his diuretics appropriately given some changes in his insurance. Therefore there was a period of time that he did not have his torsemide and was instead using Lasix. The patient states that ever since this has occurred he is gained at least 10 pounds and has maintained this weight and feels that he is "continually swelling." He started to have some audible wheezing and worsening dyspnea on exertion. He is unable to sleep lying flat and is having some right knee pain that he thinks may be secondary to weight gain. He has not had any chest pain or palpitations. He recently broke his front teeth and has been unable to chew his food effectively and feels that his food is lodging in his throat. He's also been having some dietary indiscretions with high salt intake over the past week.   Past Medical History  Diagnosis Date  . Hypertension   . Congestive heart failure     No echo data available. Myocardial perfusion 2005 with EF 61%, presumed diastolic HF   . Diverticulosis     with history of diverticulitis in 10/2011  . Gout   . Major depression   . Anxiety   . Psychosexual dysfunction with inhibited sexual excitement   . Hyperlipidemia   . Atrial flutter   . Morbid obesity   . Diabetic peripheral neuropathy   . Type II diabetes mellitus dx'd 1991    previously on insulin in 2000 for 3-4 years (but was stopped because adamantly did not want to be on insulin)  . Chronic bronchitis     "get it q yr" (07/26/2013)  . Shortness of breath     "all the time" (07/26/2013)  . OSA (obstructive sleep apnea) 1998    previously on CPAP - unable to tolerate; "going to purchase  one next week" (07/26/2013)  . Arthritis     involving knees, ankles, back, elbows  . Chronic back pain   . GERD (gastroesophageal reflux disease)   . Asthma   . Pneumonia    Current Outpatient Prescriptions  Medication Sig Dispense Refill  . allopurinol (ZYLOPRIM) 300 MG tablet Take 1 tablet (300 mg total) by mouth daily. 30 tablet 3  . amiodarone (PACERONE) 200 MG tablet TAKE 1 TABLET (200 MG TOTAL) BY MOUTH DAILY. 30 tablet 0  . aspirin-sod bicarb-citric acid (ALKA-SELTZER) 325 MG TBEF tablet Take 325 mg by mouth every 6 (six) hours as needed (heartburn).    Marland Kitchen atorvastatin (LIPITOR) 40 MG tablet Take 1 tablet (40 mg total) by mouth daily. (Patient not taking: Reported on 12/24/2014) 30 tablet 0  . Benzalkonium Chloride 50 % SOLN Clean scrotal area daily. 100 mL 0  . bismuth subsalicylate (PEPTO BISMOL) 262 MG/15ML suspension Take 30 mLs by mouth every 6 (six) hours as needed for indigestion.    . carvedilol (COREG) 6.25 MG tablet TAKE 1 TABLET (6.25 MG TOTAL) BY MOUTH 2 (TWO) TIMES DAILY WITH A MEAL. 60 tablet 1  . docusate sodium (COLACE) 100 MG capsule Take 100 mg by mouth daily as needed for mild constipation. For constipation    . famotidine (PEPCID) 20 MG tablet Take  1 tablet (20 mg total) by mouth 2 (two) times daily. 30 tablet 0  . gabapentin (NEURONTIN) 600 MG tablet Take 1 tablet (600 mg total) by mouth 2 (two) times daily. Takes  in the am &  in the pm 60 tablet 5  . HYDROcodone-acetaminophen (NORCO/VICODIN) 5-325 MG per tablet Take 1-2 tablets by mouth every 6 (six) hours as needed for moderate pain. 60 tablet 0  . ibuprofen (ADVIL,MOTRIN) 200 MG tablet Take 400 mg by mouth every 6 (six) hours as needed for mild pain or moderate pain.    Marland Kitchen levalbuterol (XOPENEX HFA) 45 MCG/ACT inhaler Inhale 1-2 puffs into the lungs every 4 (four) hours as needed for wheezing. 1 Inhaler 12  . losartan (COZAAR) 25 MG tablet TAKE 1 TABLET (25 MG TOTAL) BY MOUTH DAILY. (Patient not taking:  Reported on 12/24/2014) 30 tablet 2  . metFORMIN (GLUCOPHAGE) 1000 MG tablet Take 1 tablet (1,000 mg total) by mouth daily with breakfast. 60 tablet 11  . Multiple Vitamins-Minerals (MEGA MULTIVITAMIN FOR MEN) TABS Take 1 capsule by mouth every morning. (Patient not taking: Reported on 12/24/2014) 30 tablet 3  . sildenafil (VIAGRA) 100 MG tablet Take 1 tablet (100 mg total) by mouth daily as needed for erectile dysfunction (pt gets from [facility] note 01/24/14). 10 tablet 3  . spironolactone (ALDACTONE) 25 MG tablet TAKE 1/2 TABLET (12.5 MG TOTAL) BY MOUTH DAILY. 15 tablet 2  . torsemide (DEMADEX) 20 MG tablet Take 2 tablets (40 mg total) by mouth 2 (two) times daily. (Patient not taking: Reported on 12/24/2014) 60 tablet 6  . torsemide (DEMADEX) 20 MG tablet TAKE 80 MG (4 TABLETS) DAILY OR AS INSTRUCTED BY YOUR PROVIDER. 120 tablet 0  . XARELTO 20 MG TABS tablet TAKE 1 TABLET (20 MG TOTAL) BY MOUTH DAILY WITH SUPPER. 30 tablet 3   No current facility-administered medications for this visit.   Family History  Problem Relation Age of Onset  . Diabetes Mother   . CAD Mother 59    requiring quadruple bypass  . Hypertension Mother   . Alzheimer's disease Mother   . Stomach cancer Father   . Lung cancer Father     was a smoker  . Gout Father   . CAD Brother 48    requiring quadruple bypass  . Seizures Brother   . Hypertension Brother   . Diabetes Maternal Grandmother   . Alzheimer's disease Maternal Grandmother   . Breast cancer Paternal Aunt   . Diabetes Maternal Aunt   . Seizures Paternal Uncle    History   Social History  . Marital Status: Married    Spouse Name: N/A    Number of Children: 3  . Years of Education: 14   Occupational History  . Now unemployed     Barrister's clerk at [employer]  .  [place]   Social History Main Topics  . Smoking status: Current Some Day Smoker -- 0.12 packs/day for 10 years    Types: Cigarettes  . Smokeless tobacco: Never Used     Comment: Smokes 3 cigs  pers week / previously smoked 1ppd x 1-2 years; "nothing since ~ 2010" ; "chewed tobacco as a kid" (07/26/2013)  . Alcohol Use: 0.0 oz/week    0 Not specified per week     Comment: 07/26/2013 "dron't drink q wk; only on big sporting events, birthdays, weddings, etc"  . Drug Use: Yes    Special: Marijuana     Comment: 07/26/2013 "smoked weed in high school  and college; nothing for years"  . Sexual Activity: Yes   Other Topics Concern  . 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 They each have children but none between the two of them.   Review of Systems: Pertinent items are noted in HPI. Objective:  Physical Exam: Filed Vitals:   01/02/15 1438  BP: 139/59  Pulse: 59  Temp: 97.8 F (36.6 C)  TempSrc: Oral  Height:  (1.702 m)  Weight: 283 lb 6.4 oz (128.549 kg)  SpO2: 99%   General: sitting in chair, some mild distress  HEENT: PERRL, EOMI, no scleral icterus Cardiac: RRR, no rubs, murmurs or gallops Pulm: clear to auscultation bilaterally, some slight expiratory wheezes, moving normal volumes of air Abd: soft, nontender, largely distended, BS present Ext: warm and well perfused, 1-2+ pitting pedal edema Neuro: alert and oriented X3, cranial nerves II-XII grossly intact  Assessment & Plan:  Please see problem oriented charting  Pt discussed with Dr. Heide Spark

## 2015-01-02 NOTE — Assessment & Plan Note (Signed)
Wt Readings from Last 3 Encounters:  01/02/15 283 lb 6.4 oz (128.549 kg)  12/24/14 273 lb (123.832 kg)  11/30/14 276 lb 14.4 oz (125.601 kg)   Pt has had about sustained 10 pound weight gain that is symptomatic with PND and dyspnea on exertion. Pt has dramatic systolic CHF with EF 30-35% patient will be admitted for diuresis.  -bmet, cbc, troponin, EKG, CXR, probnp all collected in clinic

## 2015-01-02 NOTE — H&P (Signed)
Date: 01/02/2015               Patient Name:  Jeremiah Perkins MRN: [id number]  DOB: 12/11/1960 Age / Sex: 54 y.o., male   PCP: Christen Bame, MD         Medical Service: Internal Medicine Teaching Service         Attending Physician: Dr. Burns Spain, MD    First Contact: Dr. Beckie Salts Pager: [phone]  Second Contact: Dr. Johna Roles Pager: [phone]       After Hours (After 5p/  First Contact Pager: [phone]  weekends / holidays): Second Contact Pager: [phone]   Chief Complaint: sob with exertion, volume overload  History of Present Illness:   54 yo male with HTN, DM II, aflutter on anticoag, polyneuropathy, diastolic CHF EF 61% 2005 ECHO, depression/anxiety, HLD, DM II comes in with sob with exertion and feels that he gained weight and has swelling on his right knee with pain. Having subjective wheezing as well. He is unable to sleep lying flat. Has knee pain on right knee which he feels is from weight gain. He was out of his meds due to insurance issues but has been taking his demadex for last 1 week. He states he also has some pain around his ribs on both sides starting from his back coming towards the front. He noticed a crusty rash on his back which is resolving. Never had shingles in the past. He was in ED with SOB complaint 2 weeks ago and he said that time he also had some indigestion feeling. pepcid and lasix helped at that time. He takes pepcid occasionally that helps with indigestion but he ate pork skin few days ago and pepcid is not helping much with this. He was on PPi in the past for heart burn which was helping but it was d/ced when he was better.   He has hx of chemical exposues as Artist for 20 years. 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 He denies long term smoking hx, smokes occasionally. He uses inhaler, but not sure if it was for copd or something else. Never had PFT per patient. Never seen a pulmonologist. He has also been on amiodarone for last 1 year for his  aflutter with RVR per cardiology. Had DCCV to NSR on 03/2014.   Patient has hx of gout, takes colchyx PRN for attacks, was on allopurinol but was taken off due to worsening kidney function. Now has right knee pain for last few days making it hard for him to walk. Denies any fever/chills. Denies any urinary complaints. Has been taking ibuprofen frequently for the knee pain.   He uses CPAP at home occasionally, feels anxious using the cpap mask. Denies any chest pain.  Meds: Current Facility-Administered Medications  Medication Dose Route Frequency Provider Last Rate Last Dose  . 0.9 %  sodium chloride infusion  250 mL Intravenous PRN Ky Barban, MD      . acetaminophen (TYLENOL) tablet 650 mg  650 mg Oral Q6H PRN Ky Barban, MD       Or  . acetaminophen (TYLENOL) suppository 650 mg  650 mg Rectal Q6H PRN Ky Barban, MD      . allopurinol (ZYLOPRIM) tablet 300 mg  300 mg Oral Daily Ky Barban, MD      . Melene Muller ON 01/03/2015] amiodarone (PACERONE) tablet 200 mg  200 mg Oral Daily Ky Barban, MD      . Melene Muller ON 01/03/2015]  atorvastatin (LIPITOR) tablet 40 mg  40 mg Oral q1800 Ky Barban, MD      . Melene Muller ON 01/03/2015] carvedilol (COREG) tablet 6.25 mg  6.25 mg Oral BID WC Ky Barban, MD      . docusate sodium (COLACE) capsule 100 mg  100 mg Oral Daily PRN Ky Barban, MD      . famotidine (PEPCID) tablet 20 mg  20 mg Oral BID Ky Barban, MD      . gabapentin (NEURONTIN) capsule 600 mg  600 mg Oral BID Burns Spain, MD      . HYDROcodone-acetaminophen (NORCO/VICODIN) 5-325 MG per tablet 1-2 tablet  1-2 tablet Oral Q6H PRN Ky Barban, MD      . Melene Muller ON 01/03/2015] insulin aspart (novoLOG) injection 0-9 Units  0-9 Units Subcutaneous TID WC Ky Barban, MD      . levalbuterol Pauline Aus) nebulizer solution 0.63 mg  0.63 mg Nebulization Q4H PRN Burns Spain, MD      . ondansetron [facility]) tablet  4 mg  4 mg Oral Q6H PRN Ky Barban, MD       Or  . ondansetron (ZOFRAN) injection 4 mg  4 mg Intravenous Q6H PRN Ky Barban, MD      . Melene Muller ON 01/03/2015] rivaroxaban (XARELTO) tablet 20 mg  20 mg Oral Q supper Ky Barban, MD      . sodium chloride 0.9 % injection 3 mL  3 mL Intravenous Q12H Ky Barban, MD      . sodium chloride 0.9 % injection 3 mL  3 mL Intravenous Q12H Ky Barban, MD      . sodium chloride 0.9 % injection 3 mL  3 mL Intravenous PRN Ky Barban, MD      . Melene Muller ON 01/03/2015] spironolactone (ALDACTONE) tablet 12.5 mg  12.5 mg Oral Daily Ky Barban, MD      . torsemide (DEMADEX) tablet 40 mg  40 mg Oral BID Ky Barban, MD        Allergies: Allergies as of 01/02/2015 - Review Complete 12/24/2014  Allergen Reaction Noted  . Eggs or egg-derived products Nausea And Vomiting 09/22/2013  . Sulfa antibiotics Nausea And Vomiting 12/31/2012   Past Medical History  Diagnosis Date  . Hypertension   . Congestive heart failure     No echo data available. Myocardial perfusion 2005 with EF 61%, presumed diastolic HF   . Diverticulosis     with history of diverticulitis in 10/2011  . Gout   . Major depression   . Anxiety   . Psychosexual dysfunction with inhibited sexual excitement   . Hyperlipidemia   . Atrial flutter   . Morbid obesity   . Diabetic peripheral neuropathy   . Type II diabetes mellitus dx'd 1991    previously on insulin in 2000 for 3-4 years (but was stopped because adamantly did not want to be on insulin)  . Chronic bronchitis     "get it q yr" (07/26/2013)  . Shortness of breath     "all the time" (07/26/2013)  . OSA (obstructive sleep apnea) 1998    previously on CPAP - unable to tolerate; "going to purchase one next week" (07/26/2013)  . Arthritis     involving knees, ankles, back, elbows  . Chronic back pain   . GERD (gastroesophageal reflux disease)   . Asthma   . Pneumonia    Past  Surgical History  Procedure  Laterality Date  . Ankle reconstruction Right 1990's    2/2 trauma sustained after fall  . 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 Cyst removal leg Left 1960's    back of left leg  . Cardiac catheterization  1990's  . Tee without cardioversion N/A 07/28/2013    Procedure: TRANSESOPHAGEAL ECHOCARDIOGRAM (TEE);  Surgeon: Laurey Morale, MD;  Location: [facility] ENDOSCOPY;  Service: Cardiovascular;  Laterality: N/A;  . Cardioversion N/A 07/28/2013    Procedure: CARDIOVERSION;  Surgeon: Laurey Morale, MD;  Location: [facility] ENDOSCOPY;  Service: Cardiovascular;  Laterality: N/A;  . Cardioversion N/A 10/26/2013    Procedure: CARDIOVERSION;  Surgeon: Cassell Clement, MD;  Location: [facility] ENDOSCOPY;  Service: Cardiovascular;  Laterality: N/A;  . Left heart catheterization with coronary angiogram N/A 07/27/2013    Procedure: LEFT HEART CATHETERIZATION WITH CORONARY ANGIOGRAM;  Surgeon: Peter M Swaziland, MD;  Location: [facility] CATH LAB;  Service: Cardiovascular;  Laterality: N/A;   Family History  Problem Relation Age of Onset  . Diabetes Mother   . CAD Mother 48    requiring quadruple bypass  . Hypertension Mother   . Alzheimer's disease Mother   . Stomach cancer Father   . Lung cancer Father     was a smoker  . Gout Father   . CAD Brother 48    requiring quadruple bypass  . Seizures Brother   . Hypertension Brother   . Diabetes Maternal Grandmother   . Alzheimer's disease Maternal Grandmother   . Breast cancer Paternal Aunt   . Diabetes Maternal Aunt   . Seizures Paternal Uncle    History   Social History  . Marital Status: Married    Spouse Name: N/A    Number of Children: 3  . Years of Education: 14   Occupational History  . Now unemployed     Barrister's clerk at [employer]  .  [employer]   Social History Main Topics  . Smoking status: Current Some Day Smoker -- 0.12 packs/day for 10 years    Types: Cigarettes  . Smokeless tobacco: Never Used     Comment: Smokes 3 cigs  pers week / previously smoked 1ppd x 1-2 years; "nothing since ~ 2010" ; "chewed tobacco as a kid" (07/26/2013)  . Alcohol Use: 0.0 oz/week    0 Not specified per week     Comment: 07/26/2013 "dron't drink q wk; only on big sporting events, birthdays, weddings, etc"  . Drug Use: Yes    Special: Marijuana     Comment: 07/26/2013 "smoked weed in high school and college; nothing for years"  . Sexual Activity: Yes   Other Topics Concern  . 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 They each have children but none between the two of them.    Review of Systems: Review of Systems  Constitutional: Negative for fever, chills, weight loss and diaphoresis.  HENT: Negative for congestion, hearing loss and sore throat.   Eyes: Negative for blurred vision, double vision, pain, discharge and redness.  Respiratory: Positive for shortness of breath and wheezing. Negative for cough, hemoptysis and sputum production.   Cardiovascular: Positive for leg swelling. Negative for chest pain, palpitations and PND.       Has pain on both sides of his rib going to his back.  Gastrointestinal: Positive for heartburn, abdominal pain and constipation. Negative for nausea, vomiting, diarrhea, blood in stool and  melena.  Genitourinary: Negative for dysuria and urgency.  Musculoskeletal: Negative.  Negative for myalgias.  Skin: Negative.   Neurological: Negative for dizziness, tingling, tremors, sensory change, seizures, loss of consciousness, weakness and headaches.  Endo/Heme/Allergies: Negative.   Psychiatric/Behavioral: Negative.       Physical Exam: Blood pressure 140/70, pulse 56, temperature 98.9 F (37.2 C), temperature source Oral, resp. rate 18, height  (1.702 m), weight 124.2 kg (273 lb 13 oz), SpO2 99 %. Physical Exam  Constitutional: He is oriented to person, place, and time. He appears well-developed and well-nourished. No  distress.  Sitting up in bed. Obese male.  HENT:  Head: Normocephalic and atraumatic.  Right Ear: External ear normal.  Left Ear: External ear normal.  Mouth/Throat: No oropharyngeal exudate.  Eyes: Conjunctivae and EOM are normal. Pupils are equal, round, and reactive to light. Right eye exhibits no discharge. Left eye exhibits no discharge. No scleral icterus.  Neck: Normal range of motion. Neck supple. No JVD present.  Cardiovascular: Normal rate, regular rhythm and normal heart sounds.  Exam reveals no gallop and no friction rub.   No murmur heard. Not in afib currently based on exam.  Respiratory: Effort normal and breath sounds normal. No stridor. No respiratory distress. He has no wheezes. He has no rales. He exhibits no tenderness.  GI: Soft. Bowel sounds are normal.  Obese but otherwise normal. No TTP on ribs. There is some healing rash on his back, dry, crusted appearing. Pruritic.   Musculoskeletal: Normal range of motion.  1+ pitting edema on both legs.  Right knee TTP, no swelling, redness, warmth. ROM limited on right knee. Left knee normal.   Lymphadenopathy:    He has no cervical adenopathy.  Neurological: He is alert and oriented to person, place, and time. He displays normal reflexes. No cranial nerve deficit. Coordination normal.  Skin: He is not diaphoretic.     Lab results: Basic Metabolic Panel:  Recent Labs  16/10/96 1537  NA 139  K 3.8  CL 101  CO2 26  GLUCOSE 125*  BUN 39*  CREATININE 1.51*  CALCIUM 9.1   Liver Function Tests: No results for input(s): AST, ALT, ALKPHOS, BILITOT, PROT, ALBUMIN in the last 72 hours. No results for input(s): LIPASE, AMYLASE in the last 72 hours. No results for input(s): AMMONIA in the last 72 hours. CBC:  Recent Labs  01/02/15 1537  WBC 6.5  NEUTROABS 4.6  HGB 14.3  HCT 43.4  MCV 91.0  PLT 290   Cardiac Enzymes:  Recent Labs  01/02/15 1537  TROPONINI 0.06*   BNP: No results for input(s): PROBNP in  the last 72 hours. D-Dimer: No results for input(s): DDIMER in the last 72 hours. CBG: No results for input(s): GLUCAP in the last 72 hours. Hemoglobin A1C: No results for input(s): HGBA1C in the last 72 hours. Fasting Lipid Panel: No results for input(s): CHOL, HDL, LDLCALC, TRIG, CHOLHDL, LDLDIRECT in the last 72 hours. Thyroid Function Tests: No results for input(s): TSH, T4TOTAL, FREET4, T3FREE, THYROIDAB in the last 72 hours. Anemia Panel: No results for input(s): VITAMINB12, FOLATE, FERRITIN, TIBC, IRON, RETICCTPCT in the last 72 hours. Coagulation: No results for input(s): LABPROT, INR in the last 72 hours. Urine Drug Screen: Drugs of Abuse  No results found for: LABOPIA, COCAINSCRNUR, LABBENZ, AMPHETMU, THCU, LABBARB  Alcohol Level: No results for input(s): ETH in the last 72 hours. Urinalysis: No results for input(s): COLORURINE, LABSPEC, PHURINE, GLUCOSEU, HGBUR, BILIRUBINUR, KETONESUR, PROTEINUR, UROBILINOGEN, NITRITE, LEUKOCYTESUR  in the last 72 hours.  Invalid input(s): APPERANCEUR Misc. Labs:  Imaging results:  Dg Chest 2 View  01/02/2015   CLINICAL DATA:  Shortness breath for 2 days  EXAM: CHEST  2 VIEW  COMPARISON:  12/24/2014  FINDINGS: Mild cardiomegaly. Vascular congestion with diffuse interstitial prominence is similar to prior study and is stable dating back to 2014. I suspect there is underlying chronic interstitial lung disease. Is difficult to completely exclude interstitial edema. No confluent opacities or effusions. No acute bony abnormality.  IMPRESSION: Stable diffuse interstitial prominence, noted dating back to 2014. This may reflect chronic interstitial lung disease. Recommend clinical correlation to completely exclude interstitial edema.   Electronically Signed   By: Charlett Nose M.D.   On: 01/02/2015 16:45    Other results: EKG: low voltage. Sinus brady. Otherwise normal.  Assessment & Plan by Problem: Principal Problem:   Acute exacerbation of CHF  (congestive heart failure) Active Problems:   Hypertension   Diabetes mellitus type 2, controlled, with complications   Diabetic peripheral neuropathy   OSA (obstructive sleep apnea)   Atrial flutter   Long term current use of anticoagulant therapy  Pleuritic chest pain/rib pain - worse with inspiration on both sides. Unclear etiology - could be MSK pain, works under the car sometimes. Also could be pleuritic pain from volume overload. Has very low voltage on EKG, concerning for pericarditis or pericardial effusion but that was present on previous EKG and could be 2/2 to obesity. He also has no rubs on exam. There is concern for shingles causing pain as he had the rash and the pain around the ribs.  - trop elevated to 0.06, this could be 2/2 to AKI, no angina like chest pain currently. Will trend trops - will get ECHO to rule out pericardial effusion or pericarditis, al though unlikely - cont his norco he takes at home. Also he is already on gabapentin, which should treat the pain - could consider prednisone   SOB - unclear whether from volume overload vs chronic interstitial lung disease - CXR shows stable diffuse interstitial prominence dating back to 2014 which could reflect chronic intersitial lung disease. Doesn't show much of interstitial edema. He has significant occupational exposure hx to chemicals. Never had PFT in the past. Never saw pulmonologist. He has been using inhaler without knowing the indication.  He also needs PFT because he is on amio long term for last 1 year. Will need to make sure he is not developing pulmonary fibrosis from it. Will also obtain TSH and LFT's.   He likely also has OHS and OSA. Uses cpap only occasionally.  - will diurese him with 40 IV lasix BID (takes Demadex 40mg  BID at home). and see if that helps with SOB. This will indicate volume overload as the cause.  - obtain PFT to identify possible interstitial lung disease, also High resolution CT to rule  out interstitial lung disease.  - consider getting RF, ANA, and other serologic studies after CT result comes back. - CPAP QHS if tolerates. - strict i/os.  Ab pain - likely GERD - was on Pepcid. Will likely need PPI.  - Will do protonix 40mg  daily here. May need this on discharge.  AKI on CKD - crt baseline is unclear, has been ranging from 1.1 upto 1.5.  - now 1.51, last 9 days ago was 1.15. Could be cardiorenal. - will see if improves with diuresis.  - hold losartan for now.   CHF - EF 50-55%  on 03/2014, LA dilated  - CXR not clear about fluid overload but he has been on demadex. BNP not elevated significantly 237.  - cont spironolactone.  Aflutter - now in NSR - on amio and coreg.  - cont amio for now. May need to stop if he has pulm fibrosis - cont coreg. cong xarelto  Right knee pain - no fever, no injuries - could be gout, will get knee xray to rule out effusion. If effusion present, may get arthrocentesis and analyze fluid for crystals. no significant inflammation present on exam, will hold off steroid for now.  - cont Voltaren gel, hydrocodone - no NSAID as has AKI  Polyneuropathy likely from diabetes - hgba1c 6.4 - SSI here. Cont gabapentin.  Constipation - cont colace and miralax  Insomnia - traozodone.   Dispo: Disposition is deferred at this time, awaiting improvement of current medical problems. Anticipated discharge in approximately 1-2 day(s).   The patient does have a current PCP Christen Bame, MD) and does need an [facility] hospital follow-up appointment after discharge.  The patient does have transportation limitations that hinder transportation to clinic appointments.  Signed: Hyacinth Meeker, MD 01/02/2015, 8:01 PM

## 2015-01-03 ENCOUNTER — Inpatient Hospital Stay (HOSPITAL_COMMUNITY): Payer: PPO

## 2015-01-03 DIAGNOSIS — N183 Chronic kidney disease, stage 3 unspecified: Secondary | ICD-10-CM | POA: Diagnosis present

## 2015-01-03 DIAGNOSIS — E1122 Type 2 diabetes mellitus with diabetic chronic kidney disease: Secondary | ICD-10-CM

## 2015-01-03 DIAGNOSIS — Z7901 Long term (current) use of anticoagulants: Secondary | ICD-10-CM

## 2015-01-03 DIAGNOSIS — E038 Other specified hypothyroidism: Secondary | ICD-10-CM | POA: Diagnosis present

## 2015-01-03 DIAGNOSIS — M25561 Pain in right knee: Secondary | ICD-10-CM

## 2015-01-03 DIAGNOSIS — E039 Hypothyroidism, unspecified: Secondary | ICD-10-CM | POA: Diagnosis present

## 2015-01-03 DIAGNOSIS — I509 Heart failure, unspecified: Secondary | ICD-10-CM

## 2015-01-03 DIAGNOSIS — G4700 Insomnia, unspecified: Secondary | ICD-10-CM

## 2015-01-03 DIAGNOSIS — N179 Acute kidney failure, unspecified: Secondary | ICD-10-CM

## 2015-01-03 DIAGNOSIS — I4892 Unspecified atrial flutter: Secondary | ICD-10-CM

## 2015-01-03 DIAGNOSIS — E1142 Type 2 diabetes mellitus with diabetic polyneuropathy: Secondary | ICD-10-CM

## 2015-01-03 DIAGNOSIS — N189 Chronic kidney disease, unspecified: Secondary | ICD-10-CM

## 2015-01-03 DIAGNOSIS — J9620 Acute and chronic respiratory failure, unspecified whether with hypoxia or hypercapnia: Principal | ICD-10-CM

## 2015-01-03 DIAGNOSIS — Z87891 Personal history of nicotine dependence: Secondary | ICD-10-CM

## 2015-01-03 DIAGNOSIS — K5900 Constipation, unspecified: Secondary | ICD-10-CM

## 2015-01-03 LAB — PULMONARY FUNCTION TEST
DL/VA % PRED: 122 %
DL/VA: 5.44 ml/min/mmHg/L
DLCO UNC: 17.17 ml/min/mmHg
DLCO cor % pred: 61 %
DLCO cor: 17.32 ml/min/mmHg
DLCO unc % pred: 60 %
FEF 25-75 Post: 2.29 L/sec
FEF 25-75 Pre: 1.77 L/sec
FEF2575-%Change-Post: 29 %
FEF2575-%PRED-POST: 81 %
FEF2575-%Pred-Pre: 62 %
FEV1-%Change-Post: 5 %
FEV1-%PRED-POST: 61 %
FEV1-%Pred-Pre: 57 %
FEV1-POST: 1.76 L
FEV1-PRE: 1.66 L
FEV1FVC-%CHANGE-POST: 1 %
FEV1FVC-%Pred-Pre: 104 %
FEV6-%CHANGE-POST: 4 %
FEV6-%Pred-Post: 59 %
FEV6-%Pred-Pre: 57 %
FEV6-Post: 2.11 L
FEV6-Pre: 2.02 L
FEV6FVC-%Pred-Post: 104 %
FEV6FVC-%Pred-Pre: 104 %
FVC-%CHANGE-POST: 4 %
FVC-%Pred-Post: 57 %
FVC-%Pred-Pre: 55 %
FVC-Post: 2.11 L
FVC-Pre: 2.02 L
PRE FEV1/FVC RATIO: 83 %
PRE FEV6/FVC RATIO: 100 %
Post FEV1/FVC ratio: 84 %
Post FEV6/FVC ratio: 100 %
RV % PRED: 78 %
RV: 1.56 L
TLC % pred: 59 %
TLC: 3.78 L

## 2015-01-03 LAB — GLUCOSE, CAPILLARY
GLUCOSE-CAPILLARY: 139 mg/dL — AB (ref 70–99)
GLUCOSE-CAPILLARY: 172 mg/dL — AB (ref 70–99)
GLUCOSE-CAPILLARY: 167 mg/dL — AB (ref 70–99)
Glucose-Capillary: 119 mg/dL — ABNORMAL HIGH (ref 70–99)

## 2015-01-03 LAB — COMPREHENSIVE METABOLIC PANEL
ALBUMIN: 3.1 g/dL — AB (ref 3.5–5.2)
ALK PHOS: 67 U/L (ref 39–117)
ALT: 14 U/L (ref 0–53)
AST: 23 U/L (ref 0–37)
Anion gap: 10 (ref 5–15)
BUN: 37 mg/dL — ABNORMAL HIGH (ref 6–23)
CO2: 27 mmol/L (ref 19–32)
Calcium: 8.9 mg/dL (ref 8.4–10.5)
Chloride: 103 mmol/L (ref 96–112)
Creatinine, Ser: 1.58 mg/dL — ABNORMAL HIGH (ref 0.50–1.35)
GFR calc Af Amer: 55 mL/min — ABNORMAL LOW (ref >90)
GFR calc non Af Amer: 48 mL/min — ABNORMAL LOW (ref >90)
Glucose, Bld: 177 mg/dL — ABNORMAL HIGH (ref 70–99)
Potassium: 3.5 mmol/L (ref 3.5–5.1)
SODIUM: 140 mmol/L (ref 135–145)
TOTAL PROTEIN: 6.5 g/dL (ref 6.0–8.3)
Total Bilirubin: 0.8 mg/dL (ref 0.3–1.2)

## 2015-01-03 LAB — LIPASE, BLOOD: Lipase: 35 U/L (ref 11–59)

## 2015-01-03 LAB — TROPONIN I
TROPONIN I: 0.06 ng/mL — AB (ref <0.031)
Troponin I: 0.06 ng/mL — ABNORMAL HIGH (ref <0.031)

## 2015-01-03 LAB — TSH: TSH: 5.852 u[IU]/mL — ABNORMAL HIGH (ref 0.350–4.500)

## 2015-01-03 LAB — T4, FREE: FREE T4: 1.28 ng/dL (ref 0.80–1.80)

## 2015-01-03 MED ORDER — TORSEMIDE 20 MG PO TABS
40.0000 mg | ORAL_TABLET | Freq: Two times a day (BID) | ORAL | Status: DC
  Filled 2015-01-03 (×2): qty 2

## 2015-01-03 NOTE — Progress Notes (Signed)
Placed patient on CPAP set at 10cm H20.

## 2015-01-03 NOTE — Progress Notes (Signed)
Subjective: Patient states he has had DOE for several weeks but has progressively worsened over the last few days. He noted bilateral back pain that radiated to his rib cage on admission, but states it has improved. He states he did have a mildly pruritic rash on his upper middle back that has now resolved. He states his right knee pain is bothering him. He was able to extend his leg fully about 1 month ago, but now has difficulty.   Objective: Vital signs in last 24 hours: Filed Vitals:   01/02/15 2343 01/03/15 0531 01/03/15 0635 01/03/15 1300  BP:  105/62  118/56  Pulse: 60 52  54  Temp:  97.9 F (36.6 C)  97.5 F (36.4 C)  TempSrc:  Oral  Oral  Resp: Height:      Weight:   274 lb 3.2 oz (124.376 kg)   SpO2: 100% 98%  100%   Weight change:   Intake/Output Summary (Last 24 hours) at 01/03/15 1446 Last data filed at 01/03/15 1400  Gross per 24 hour  Intake   2176 ml  Output   1900 ml  Net    276 ml   Physical Exam General: alert, obese, sitting up in bed, NAD HEENT: [place]/AT, EOMI, mucus membranes moist CV: RRR, no m/g/r Pulm: CTA bilaterally, breaths non-labored Abd: BS+, soft, obese, non-tender Ext: R knee mildly tender to palpation, very minimal swelling. ROM limited, only able to extend leg about 30 degrees.  Neuro: alert and oriented x 3, no focal deficits  Skin: no rashes present   Lab Results: Basic Metabolic Panel:  Recent Labs Lab 01/02/15 1537 01/03/15 0315  NA 139 140  K 3.8 3.5  CL 101 103  CO2 26 27  GLUCOSE 125* 177*  BUN 39* 37*  CREATININE 1.51* 1.58*  CALCIUM 9.1 8.9   Liver Function Tests:  Recent Labs Lab 01/03/15 0315  AST 23  ALT 14  ALKPHOS 67  BILITOT 0.8  PROT 6.5  ALBUMIN 3.1*    Recent Labs Lab 01/03/15 0315  LIPASE 35   CBC:  Recent Labs Lab 01/02/15 1537  WBC 6.5  NEUTROABS 4.6  HGB 14.3  HCT 43.4  MCV 91.0  PLT 290   Cardiac Enzymes:  Recent Labs Lab 01/02/15 1537 01/02/15 2312  01/03/15 0315  TROPONINI 0.06* 0.06* 0.06*   CBG:  Recent Labs Lab 01/02/15 2307 01/03/15 0633 01/03/15 1247  GLUCAP 177* 139* 172*   Thyroid Function Tests:  Recent Labs Lab 01/03/15 0315  TSH 5.852*   Studies/Results: Dg Chest 2 View  01/02/2015   CLINICAL DATA:  Shortness breath for 2 days  EXAM: CHEST  2 VIEW  COMPARISON:  12/24/2014  FINDINGS: Mild cardiomegaly. Vascular congestion with diffuse interstitial prominence is similar to prior study and is stable dating back to 2014. I suspect there is underlying chronic interstitial lung disease. Is difficult to completely exclude interstitial edema. No confluent opacities or effusions. No acute bony abnormality.  IMPRESSION: Stable diffuse interstitial prominence, noted dating back to 2014. This may reflect chronic interstitial lung disease. Recommend clinical correlation to completely exclude interstitial edema.   Electronically Signed   By: Charlett Nose M.D.   On: 01/02/2015 16:45   Dg Knee Complete 4 Views Right  01/03/2015   CLINICAL DATA:  Acute onset of right lateral knee pain. Initial encounter.  EXAM: RIGHT KNEE - COMPLETE 4+ VIEW  COMPARISON:  None.  FINDINGS: There is no evidence of fracture  or dislocation. The joint spaces are preserved. Mild wall osteophytes and tibial spine osteophytes are seen; tiny osteophytes are suggested at the patellofemoral compartment, with suggestion of mild cortical irregularity along the articular surface of the patella.  A small knee joint effusion is noted. The visualized soft tissues are otherwise unremarkable in appearance.  IMPRESSION: 1. No evidence of fracture or dislocation. 2. Small knee joint effusion noted. 3. 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   Electronically Signed   By: Roanna Raider M.D.   On: 01/03/2015 02:27   Medications: I have reviewed the patient's current medications. Scheduled Meds: . amiodarone  200 mg Oral Daily  . atorvastatin  40 mg Oral q1800  . carvedilol   6.25 mg Oral BID WC  . docusate sodium  100 mg Oral BID  . gabapentin  600 mg Oral BID  . insulin aspart  0-9 Units Subcutaneous TID WC  . pantoprazole  40 mg Oral Daily  . rivaroxaban  20 mg Oral Q supper  . sodium chloride  3 mL Intravenous Q12H  . sodium chloride  3 mL Intravenous Q12H  . spironolactone  12.5 mg Oral Daily  . traZODone  50 mg Oral QHS   Continuous Infusions:  PRN Meds:.sodium chloride, acetaminophen **OR** acetaminophen, albuterol, HYDROcodone-acetaminophen, ondansetron **OR** ondansetron (ZOFRAN) IV, polyethylene glycol, sodium chloride Assessment/Plan:  Acute on Chronic Respiratory Failure: Patient reports dyspnea and DOE over last few months but progressively worsening over last few days. He states he has SOB whether he is at rest or with exertion. He is saturating 100% on room air. He does have significant environmental exposure which predisposes him to ILD. He has some smoking history as well. He also is morbidly obese. PFTs done today show FEV1/FVC 84, TLC 59% predicted, DLCO 60, and no response to bronchodilators. HRCT Chest shows no evidence of pulmonary fibrosis, but pulmonary hypertension is present. Still awaiting echo. - f/u echo results - CPAP PRN - Check O2 with ambulation - Patient would benefit from outpatient weight loss - Strict I&Os  Right Knee Pain: Patient states he has history of gout and has previously had arthrocentesis done in [place]. He states he was told he had gout due to an elevated uric acid level. Pain has been ongoing for about 1 month. X-ray shows a small effusion and minimal osteoarthritis. Fluid analysis ideal for confirmation of gout. IR was consulted, but state they do not perform arthrocentesis and doubt enough fluid present for tap anyway. Pain could also be secondary to tendonitis/ligament issue.  - Consider consulting ortho for arthrocentesis - Consider treating empirically with colchicine - Norco for pain   AKI on CKD: Cr 1.51 on  admission, repeat 1.58. Baseline unclear as has been fluctuating between 1.1-1.5.  - Continue to monitor - Repeat bmet in AM   CHF: Last echo in May 2015 shows EF 50-55% with mildly dilated left atrium. BNP only 230 in setting of CKD. He takes Coreg, Spironolactone, and Torsemide at home.  - Continue Spironolactone 12.5 mg daily   AFlutter: Currently in NSR. Patient takes Xarelto 20 mg daily at home. Also on Amiodarone 200 mg daily.  - Continue Xarelto  - Continue Amiodarone   Type 2 DM with Polyneuropathy: Last HbA1c 6.7. Patient takes Metformin 1000 mg daily and Gabapentin 600 mg BID at home. - Hold Metformin - Continue Gabapentin  - ISS - CBGs 4 times daily   Constipation: - Continue Colace and Miralax   Insomnia:  - Continue Trazadone  Diet: Heart healthy/Carb modified  VTE PPx: Xarelto Dispo: Disposition is deferred at this time, awaiting improvement of current medical problems.  Anticipated discharge in approximately 1-2 day(s).   The patient does have a current PCP Christen Bame, MD) and does need an [facility] hospital follow-up appointment after discharge.  The patient does not have transportation limitations that hinder transportation to clinic appointments.  .Services Needed at time of discharge: Y = Yes, Blank = No PT:   OT:   RN:   Equipment:   Other:     LOS: 1 day   Rich Number, MD 01/03/2015, 2:46 PM

## 2015-01-03 NOTE — Progress Notes (Signed)
Pt a/o, c/o knee pain, PRN Norco given as ordered, pt oob ad lib, pt stable

## 2015-01-03 NOTE — Consult Note (Signed)
Came to visit patient and offer [facility] Care Management services on behalf of his [employer] insurance. Patient lives with wife and stepdaughter. Reports he usually weighs himself everyday but will definitely do better with daily weights after this hospital discharge. Explained [facility] Care Management services and consents were obtained. Left [facility] Care Management packet at bedside. Patient will receive post hospital discharge call and will be evaluated for monthly home visits. Will make inpatient RNCM aware of visit. Also patient could benefit from home health services RN as well for CHF management. [facility] Care Management will not interfere or replace home health services. Will continue to follow.  [name], MSN-RN,BSN- [facility] [phone]

## 2015-01-04 DIAGNOSIS — G4733 Obstructive sleep apnea (adult) (pediatric): Secondary | ICD-10-CM

## 2015-01-04 DIAGNOSIS — E669 Obesity, unspecified: Secondary | ICD-10-CM

## 2015-01-04 DIAGNOSIS — R0600 Dyspnea, unspecified: Secondary | ICD-10-CM

## 2015-01-04 LAB — GLUCOSE, CAPILLARY
Glucose-Capillary: 166 mg/dL — ABNORMAL HIGH (ref 70–99)
Glucose-Capillary: 178 mg/dL — ABNORMAL HIGH (ref 70–99)
Glucose-Capillary: 181 mg/dL — ABNORMAL HIGH (ref 70–99)
Glucose-Capillary: 150 mg/dL — ABNORMAL HIGH (ref 70–99)

## 2015-01-04 LAB — TROPONIN I: TROPONIN I: 0.06 ng/mL — AB (ref <0.031)

## 2015-01-04 LAB — BASIC METABOLIC PANEL
Anion gap: 8 (ref 5–15)
BUN: 28 mg/dL — AB (ref 6–23)
CO2: 28 mmol/L (ref 19–32)
Calcium: 9.2 mg/dL (ref 8.4–10.5)
Chloride: 101 mmol/L (ref 96–112)
Creatinine, Ser: 1.28 mg/dL (ref 0.50–1.35)
GFR calc Af Amer: 71 mL/min — ABNORMAL LOW (ref >90)
GFR, EST NON AFRICAN AMERICAN: 61 mL/min — AB (ref >90)
GLUCOSE: 170 mg/dL — AB (ref 70–99)
Potassium: 4.1 mmol/L (ref 3.5–5.1)
Sodium: 137 mmol/L (ref 135–145)

## 2015-01-04 LAB — LIPID PANEL
Cholesterol: 186 mg/dL (ref 0–200)
HDL: 33 mg/dL — ABNORMAL LOW (ref >39)
LDL Cholesterol: 113 mg/dL — ABNORMAL HIGH (ref 0–99)
Total CHOL/HDL Ratio: 5.6 RATIO
Triglycerides: 198 mg/dL — ABNORMAL HIGH (ref <150)
VLDL: 40 mg/dL (ref 0–40)

## 2015-01-04 MED ORDER — TORSEMIDE 20 MG PO TABS
40.0000 mg | ORAL_TABLET | Freq: Two times a day (BID) | ORAL | Status: DC
  Filled 2015-01-04 (×2): qty 2

## 2015-01-04 MED ORDER — TORSEMIDE 20 MG PO TABS
40.0000 mg | ORAL_TABLET | Freq: Two times a day (BID) | ORAL | Status: DC
  Administered 2015-01-04 – 2015-01-05 (×2): 40 mg via ORAL
  Filled 2015-01-04 (×4): qty 2

## 2015-01-04 MED ORDER — PERFLUTREN LIPID MICROSPHERE
1.0000 mL | INTRAVENOUS | Status: AC | PRN
  Filled 2015-01-04: qty 10

## 2015-01-04 NOTE — Progress Notes (Signed)
Patient's HR ranging from 40-50. Patient sitting on side of bed with no complaints. Amiodarone 200mg  given this AM by student nurse. Dr. Beckie Salts paged and made aware. Dr. Beckie Salts also made aware that Amiodarone given. No new orders. Will continue to monitor.

## 2015-01-04 NOTE — Progress Notes (Signed)
Patient sitting in chair. Patient complains of moderate constipation and that belly is starting to get sore. Patient abdomen slightly distended. Bowel sounds active. Order for PRN Miralax given with of prune juice, per patient choice. Will continue to monitor patient.

## 2015-01-04 NOTE — Progress Notes (Signed)
  Echocardiogram 2D Echocardiogram has been performed.  Kyana Aicher FRANCES 01/04/2015, 3:19 PM

## 2015-01-04 NOTE — Progress Notes (Signed)
Pt refused BiPAP at this time informed pt if he changed his mind to call for RT.

## 2015-01-04 NOTE — Progress Notes (Signed)
Subjective: Patient used the CPAP machine overnight and tolerated well. It was emphasized to him the importance using his CPAP at home every night as pulmonary hypertension is a concern. Also, the importance of weight loss was discussed and exercising once his right knee pain is assessed by sports medicine and improves. Awaiting echo to assess right heart function as contributor to pulmonary hypertension.   Objective: Vital signs in last 24 hours: Filed Vitals:   01/03/15 1635 01/03/15 2014 01/04/15 0540 01/04/15 0820  BP: 126/97 116/54 112/60 137/67  Pulse: 51 58 51 64  Temp:  97.8 F (36.6 C) 98 F (36.7 C) 97.8 F (36.6 C)  TempSrc:  Oral Oral Oral  Resp:  18 18   Height:      Weight:   280 lb (127.007 kg)   SpO2:  99% 98% 100%   Weight change: 6 lb 3 oz (2.807 kg)  Intake/Output Summary (Last 24 hours) at 01/04/15 0943 Last data filed at 01/04/15 0820  Gross per 24 hour  Intake   1638 ml  Output   1600 ml  Net     38 ml   Physical Exam General: alert, sitting up in bed, pleasant  HEENT: [place]/AT, EOMI, mucus membranes moist CV: RRR, no m/g/r Pulm: CTA bilaterally, breaths non-labored Abd: BS+, soft, obese, non-tender Ext: R knee mildly swollen compared to left, very minimal effusion present. Mild tenderness to palpation present.  Neuro: alert and oriented x 3, no focal deficits  Lab Results: Basic Metabolic Panel:  Recent Labs Lab 01/03/15 0315 01/04/15 0518  NA 140 137  K 3.5 4.1  CL 103 101  CO2 27 28  GLUCOSE 177* 170*  BUN 37* 28*  CREATININE 1.58* 1.28  CALCIUM 8.9 9.2    CBC:  Recent Labs Lab 01/02/15 1537  WBC 6.5  NEUTROABS 4.6  HGB 14.3  HCT 43.4  MCV 91.0  PLT 290   Cardiac Enzymes:  Recent Labs Lab 01/02/15 2312 01/03/15 0315 01/04/15 0518  TROPONINI 0.06* 0.06* 0.06*   CBG:  Recent Labs Lab 01/02/15 2307 01/03/15 0633 01/03/15 1247 01/03/15 1624 01/03/15 2110 01/04/15 0622  GLUCAP 177* 139* 172* 119* 167* 166*    Fasting Lipid Panel:  Recent Labs Lab 01/04/15 0518  CHOL 186  HDL 33*  LDLCALC 113*  TRIG 198*  CHOLHDL 5.6   Thyroid Function Tests:  Recent Labs Lab 01/03/15 0315  TSH 5.852*  FREET4 1.28   Studies/Results: Dg Chest 2 View  01/02/2015   CLINICAL DATA:  Shortness breath for 2 days  EXAM: CHEST  2 VIEW  COMPARISON:  12/24/2014  FINDINGS: Mild cardiomegaly. Vascular congestion with diffuse interstitial prominence is similar to prior study and is stable dating back to 2014. I suspect there is underlying chronic interstitial lung disease. Is difficult to completely exclude interstitial edema. No confluent opacities or effusions. No acute bony abnormality.  IMPRESSION: Stable diffuse interstitial prominence, noted dating back to 2014. This may reflect chronic interstitial lung disease. Recommend clinical correlation to completely exclude interstitial edema.   Electronically Signed   By: Rolm Baptise M.D.   On: 01/02/2015 16:45   Ct Chest High Resolution  01/03/2015   CLINICAL DATA:  Interstitial lung disease, shortness of breath.  EXAM: CT CHEST WITHOUT CONTRAST  TECHNIQUE: Multidetector CT imaging of the chest was performed following the standard protocol without intravenous contrast. High resolution imaging of the lungs, as well as inspiratory and expiratory imaging, was performed.  COMPARISON:  10/27/2011.  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 The majority of mediastinal lymph nodes are not enlarged by CT size criteria. A 1.9 cm short axis lymph node adjacent to the right atrium is unchanged. Hilar regions are difficult to definitively evaluate without IV contrast. No axillary adenopathy. Pulmonary arteries and heart are enlarged. No pericardial effusion.  Lungs/Pleura: Mosaic attenuation is seen in the lungs. Scattered pulmonary nodules measure up to 8 mm (7 x 9 mm) along the right major fissure, unchanged and therefore benign. No subpleural  reticulation, traction bronchiectasis/ bronchiolectasis, architectural distortion or honeycombing. Suspect mild air trapping on expiratory phase imaging. No pleural fluid. Airway is unremarkable.  Upper abdomen: Visualized portions of the liver, adrenal glands, spleen and stomach are grossly unremarkable.  Musculoskeletal: No worrisome lytic or sclerotic lesions. Degenerative changes are seen in the spine.  IMPRESSION: 1. Probable small airways disease. No evidence of fibrotic interstitial lung disease. 2. Enlarged pulmonary arteries, indicative of pulmonary arterial hypertension.   Electronically Signed   By: Lorin Picket M.D.   On: 01/03/2015 16:07   Dg Knee Complete 4 Views Right  01/03/2015   CLINICAL DATA:  Acute onset of right lateral knee pain. Initial encounter.  EXAM: RIGHT KNEE - COMPLETE 4+ VIEW  COMPARISON:  None.  FINDINGS: There is no evidence of fracture or dislocation. The joint spaces are preserved. Mild wall osteophytes and tibial spine osteophytes are seen; tiny osteophytes are suggested at the patellofemoral compartment, with suggestion of mild cortical irregularity along the articular surface of the patella.  A small knee joint effusion is noted. The visualized soft tissues are otherwise unremarkable in appearance.  IMPRESSION: 1. No evidence of fracture or dislocation. 2. Small knee joint effusion noted. 3. 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   Electronically Signed   By: Garald Balding M.D.   On: 01/03/2015 02:27   Medications: I have reviewed the patient's current medications. Scheduled Meds: . amiodarone  200 mg Oral Daily  . atorvastatin  40 mg Oral q1800  . docusate sodium  100 mg Oral BID  . gabapentin  600 mg Oral BID  . insulin aspart  0-9 Units Subcutaneous TID WC  . pantoprazole  40 mg Oral Daily  . rivaroxaban  20 mg Oral Q supper  . sodium chloride  3 mL Intravenous Q12H  . sodium chloride  3 mL Intravenous Q12H  . spironolactone  12.5 mg Oral Daily  .  traZODone  50 mg Oral QHS   Continuous Infusions:  PRN Meds:.sodium chloride, acetaminophen **OR** acetaminophen, albuterol, HYDROcodone-acetaminophen, ondansetron **OR** ondansetron (ZOFRAN) IV, polyethylene glycol, sodium chloride Assessment/Plan: Acute on Chronic Respiratory Failure: Confirmed with radiology that no evidence of ILD is present on HRCT Chest. Radiology states there is "air trapping" and small bronchiole disease that is consistent with a restrictive process as seen on PFTs. Evidence of pulmonary hypertension was confirmed as well. Patient will need to undergo right heart cath to definitively make diagnosis. Awaiting echo to evaluate right heart function. Patient has had a sleep study in 2014 which confirmed diagnosis of obstructive sleep apnea. Today, it was emphasized to patient the importance of using his CPAP machine every night as this is likely the cause for his underlying PAH.  - f/u echo results - CPAP every night - Check O2 with ambulation - Patient would benefit from outpatient weight loss. This was discussed with patient and he will make effort once right knee pain improves. Will see sports medicine, see below.  - Strict I&Os  Right Knee Pain: Likely not gout flare as patient has had pain for over a month. Knee is not red, not significantly swollen or tender. Unable to do arthrocentesis as there is not a large enough effusion present. His pain is most likely a tendonitis or ligament issue in combination with morbid obesity. ?Patellofemoral syndrome. Will refer to sports medicine for evaluation. Patient states he has already met with our life coach/nutritionist Butch Penny in the clinic and knows how to count calories and what foods he should be eating. - Refer to sports medicine for further evaluation as outpatient  - Norco for pain   AKI on CKD: Cr improved this morning to 1.28. Baseline unclear as has been fluctuating between 1.1-1.5.  - Continue to monitor  CHF: Last echo  in May 2015 shows EF 50-55% with mildly dilated left atrium. BNP only 230 in setting of CKD. He takes Coreg, Spironolactone, and Torsemide at home.  - Continue Spironolactone 12.5 mg daily  - Start home Torsemide   AFlutter: Currently in NSR. Patient takes Xarelto 20 mg daily at home. Also on Amiodarone 200 mg daily.  - Continue Xarelto  - Continue Amiodarone   Type 2 DM with Polyneuropathy: Last HbA1c 6.7. Patient takes Metformin 1000 mg daily and Gabapentin 600 mg BID at home. - Hold Metformin - Continue Gabapentin  - ISS - CBGs 4 times daily   Constipation: - Continue Colace and Miralax   Insomnia:  - Continue Trazadone   Diet: Heart healthy/Carb modified  VTE PPx: Xarelto Dispo: Disposition is deferred at this time, awaiting improvement of current medical problems.  Anticipated discharge in approximately 1 day.   The patient does have a current PCP Clinton Gallant, MD) and does need an [facility] hospital follow-up appointment after discharge.  The patient does not have transportation limitations that hinder transportation to clinic appointments.  .Services Needed at time of discharge: Y = Yes, Blank = No PT:   OT:   RN:   Equipment:   Other:     LOS: 2 days   Albin Felling, MD 01/04/2015, 9:43 AM

## 2015-01-05 DIAGNOSIS — R001 Bradycardia, unspecified: Secondary | ICD-10-CM

## 2015-01-05 LAB — GLUCOSE, CAPILLARY
Glucose-Capillary: 133 mg/dL — ABNORMAL HIGH (ref 70–99)
Glucose-Capillary: 135 mg/dL — ABNORMAL HIGH (ref 70–99)

## 2015-01-05 LAB — HEMOGLOBIN A1C
Hgb A1c MFr Bld: 7 % — ABNORMAL HIGH (ref 4.8–5.6)
Mean Plasma Glucose: 154 mg/dL

## 2015-01-05 LAB — ANA: ANA: NEGATIVE

## 2015-01-05 LAB — RHEUMATOID FACTOR: Rhuematoid fact SerPl-aCnc: 8.2 IU/mL (ref 0.0–13.9)

## 2015-01-05 LAB — CYCLIC CITRUL PEPTIDE ANTIBODY, IGG: Cyclic Citrullin Peptide Ab: <2 U/mL (ref 0.0–5.0)

## 2015-01-05 MED ORDER — HYDROCODONE-ACETAMINOPHEN 5-325 MG PO TABS: 1.0000 | ORAL_TABLET | Freq: Four times a day (QID) | ORAL | Status: DC | PRN

## 2015-01-05 MED ORDER — ATORVASTATIN CALCIUM 40 MG PO TABS
40.0000 mg | ORAL_TABLET | Freq: Every day | ORAL | Status: DC
Start: 2015-01-05 — End: 2015-06-19

## 2015-01-05 NOTE — Progress Notes (Signed)
Subjective: Patient reports his dyspnea is better. He refused to use CPAP last night. Explained again this morning the importance of using CPAP every night.   Objective: Vital signs in last 24 hours: Filed Vitals:   01/04/15 0820 01/04/15 1319 01/04/15 2208 01/05/15 0547  BP: 137/67 126/62 137/70 104/55  Pulse: 64 54 56 53  Temp: 97.8 F (36.6 C) 97.8 F (36.6 C) 97.8 F (36.6 C) 98.6 F (37 C)  TempSrc: Oral Oral Oral Oral  Resp:  Height:      Weight:    278 lb 3.2 oz (126.191 kg)  SpO2: 100% 99% 100% 95%   Weight change: -1 lb 12.8 oz (-0.816 kg)  Intake/Output Summary (Last 24 hours) at 01/05/15 1148 Last data filed at 01/05/15 1127  Gross per 24 hour  Intake   1617 ml  Output   2000 ml  Net   -383 ml   Physical Exam General: alert, sitting up in bed, pleasant  HEENT: [place]/AT, EOMI, mucus membranes moist CV: RRR, no m/g/r Pulm: CTA bilaterally, breaths non-labored Abd: BS+, soft, obese, non-tender Ext: R knee mildly swollen compared to left, very minimal effusion present. Mild tenderness to palpation present.  Neuro: alert and oriented x 3, no focal deficits  Lab Results: Basic Metabolic Panel:  Recent Labs Lab 01/03/15 0315 01/04/15 0518  NA 140 137  K 3.5 4.1  CL 103 101  CO2 27 28  GLUCOSE 177* 170*  BUN 37* 28*  CREATININE 1.58* 1.28  CALCIUM 8.9 9.2   CBC:  Recent Labs Lab 01/02/15 1537  WBC 6.5  NEUTROABS 4.6  HGB 14.3  HCT 43.4  MCV 91.0  PLT 290   Cardiac Enzymes:  Recent Labs Lab 01/02/15 2312 01/03/15 0315 01/04/15 0518  TROPONINI 0.06* 0.06* 0.06*    Recent Labs Lab 01/04/15 0622 01/04/15 1059 01/04/15 1601 01/04/15 2205 01/05/15 0613 01/05/15 1126  GLUCAP 166* 178* 181* 150* 133* 135*   Hemoglobin A1C:  Recent Labs Lab 01/04/15 0518  HGBA1C 7.0*   Fasting Lipid Panel:  Recent Labs Lab 01/04/15 0518  CHOL 186  HDL 33*  LDLCALC 113*  TRIG 198*  CHOLHDL 5.6   Thyroid Function  Tests:  Recent Labs Lab 01/03/15 0315  TSH 5.852*  FREET4 1.28   Studies/Results: Ct Chest High Resolution  01/03/2015   CLINICAL DATA:  Interstitial lung disease, shortness of breath.  EXAM: CT CHEST WITHOUT CONTRAST  TECHNIQUE: Multidetector CT imaging of the chest was performed following the standard protocol without intravenous contrast. High resolution imaging of the lungs, as well as inspiratory and expiratory imaging, was performed.  COMPARISON:  10/27/2011.  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 The majority of mediastinal lymph nodes are not enlarged by CT size criteria. A 1.9 cm short axis lymph node adjacent to the right atrium is unchanged. Hilar regions are difficult to definitively evaluate without IV contrast. No axillary adenopathy. Pulmonary arteries and heart are enlarged. No pericardial effusion.  Lungs/Pleura: Mosaic attenuation is seen in the lungs. Scattered pulmonary nodules measure up to 8 mm (7 x 9 mm) along the right major fissure, unchanged and therefore benign. No subpleural reticulation, traction bronchiectasis/ bronchiolectasis, architectural distortion or honeycombing. Suspect mild air trapping on expiratory phase imaging. No pleural fluid. Airway is unremarkable.  Upper abdomen: Visualized portions of the liver, adrenal glands, spleen and stomach are grossly unremarkable.  Musculoskeletal: No worrisome lytic or sclerotic lesions. Degenerative changes are seen in the spine.  IMPRESSION: 1. Probable small airways disease. No evidence of fibrotic interstitial lung disease. 2. Enlarged pulmonary arteries, indicative of pulmonary arterial hypertension.   Electronically Signed   By: Leanna Battles M.D.   On: 01/03/2015 16:07   Medications: I have reviewed the patient's current medications. Scheduled Meds: . amiodarone  200 mg Oral Daily  . atorvastatin  40 mg Oral q1800  . docusate sodium  100 mg Oral BID  . gabapentin  600 mg  Oral BID  . insulin aspart  0-9 Units Subcutaneous TID WC  . pantoprazole  40 mg Oral Daily  . rivaroxaban  20 mg Oral Q supper  . sodium chloride  3 mL Intravenous Q12H  . sodium chloride  3 mL Intravenous Q12H  . spironolactone  12.5 mg Oral Daily  . torsemide  40 mg Oral BID  . traZODone  50 mg Oral QHS   Continuous Infusions:  PRN Meds:.sodium chloride, acetaminophen **OR** acetaminophen, albuterol, HYDROcodone-acetaminophen, ondansetron **OR** ondansetron (ZOFRAN) IV, polyethylene glycol, sodium chloride Assessment/Plan:  Acute on Chronic Respiratory Failure- Improved: Echo was not conclusive as EF and pulmonary artery pressures could not be assessed.due to poor image quality. PFTs most consistent with ILD, but HRCT Chest did not show evidence of this. He most likely has pulmonary hypertension as seen on HRCT secondary to his OSA. He will need to undergo right heart cath to definitively make diagnosis. He has follow up with Dr. Patty Sermons (cardiology) on Feb 19th. His morbid obesity is also likely contributing to his dyspnea.  - Patient encouraged to use CPAP every night - Patient would benefit from outpatient weight loss. This was discussed with patient and he will make effort once right knee pain improves. Will see sports medicine, see below.  - Strict I&Os  Right Knee Pain: Pain improving. Patient has follow up for sports medicine.   AKI on CKD- Improved: Cr to 1.28. Baseline unclear as has been fluctuating between 1.1-1.5.  - Continue to monitor  Bradycardia: HR in 50s. Patient states this is normal for him. His Coreg was discontinued on 2/10. He has follow up with Dr. Patty Sermons next week.  - Patient notified to stop taking Coreg until appt with Dr. Patty Sermons    CHF: Last echo in May 2015 shows EF 50-55% with mildly dilated left atrium. BNP only 230 in setting of CKD. He takes Coreg, Spironolactone, and Torsemide at home.  - Continue Spironolactone 12.5 mg daily  - Continue  Torsemide   AFlutter: Currently in NSR. Patient takes Xarelto 20 mg daily at home. Also on Amiodarone 200 mg daily.  - Continue Xarelto  - Continue Amiodarone   Type 2 DM with Polyneuropathy: Last HbA1c 6.7. Patient takes Metformin 1000 mg daily and Gabapentin 600 mg BID at home. - Resume Metformin 24 hours after discharge since had recent contrast exposure - Continue Gabapentin  - ISS - CBGs 4 times daily   Constipation: - Continue Colace and Miralax   Insomnia:  - Continue Trazadone   Diet: Heart healthy/Carb modified  VTE PPx: Xarelto Dispo: Discharge today  The patient does have a current PCP Christen Bame, MD) and does need an [facility] hospital follow-up appointment after discharge.  The patient does not have transportation limitations that hinder transportation to clinic appointments.  .Services Needed at time of discharge: Y = Yes, Blank = No PT:   OT:   RN:   Equipment:   Other:     LOS: 3 days   Rich Number, MD 01/05/2015, 11:48 AM

## 2015-01-05 NOTE — Discharge Summary (Signed)
Name: Jeremiah Perkins MRN: [id number] DOB: Nov 21, 1961 54 y.o. PCP: Christen Bame, MD  Date of Admission: 01/02/2015  6:01 PM Date of Discharge: 01/05/2015 Attending Physician: Burns Spain, MD  Discharge Diagnosis: Principal Problem Acute on Chronic Respiratory Failure Likely Pulmonary Hypertension Active Problems Right Knee Pain AKI on CKD Bradycardia CHF Atrial Flutter Type 2 DM with Polyneuropathy Constipation Insomnia  Discharge Medications:   Medication List    STOP taking these medications        carvedilol 6.25 MG tablet  Commonly known as:  COREG      TAKE these medications        amiodarone 200 MG tablet  Commonly known as:  PACERONE  TAKE 1 TABLET (200 MG TOTAL) BY MOUTH DAILY.     aspirin-sod bicarb-citric acid 325 MG Tbef tablet  Commonly known as:  ALKA-SELTZER  Take 325 mg by mouth every 6 (six) hours as needed (heartburn).     atorvastatin 40 MG tablet  Commonly known as:  LIPITOR  Take 1 tablet (40 mg total) by mouth daily.     Benzalkonium Chloride 50 % Soln  Clean scrotal area daily.     bismuth subsalicylate 262 MG/15ML suspension  Commonly known as:  PEPTO BISMOL  Take 30 mLs by mouth every 6 (six) hours as needed for indigestion.     diclofenac sodium 1 % Gel  Commonly known as:  VOLTAREN  Apply 2 g topically 4 (four) times daily.     docusate sodium 100 MG capsule  Commonly known as:  COLACE  Take 100 mg by mouth daily as needed for mild constipation. For constipation     famotidine 20 MG tablet  Commonly known as:  PEPCID  Take 1 tablet (20 mg total) by mouth 2 (two) times daily.     gabapentin 600 MG tablet  Commonly known as:  NEURONTIN  Take 1 tablet (600 mg total) by mouth 2 (two) times daily. Takes 600mg  in the am & 1200mg  in the pm     HYDROcodone-acetaminophen 5-325 MG per tablet  Commonly known as:  NORCO/VICODIN  Take 1-2 tablets by mouth every 6 (six) hours as needed for moderate pain.     levalbuterol 45  MCG/ACT inhaler  Commonly known as:  XOPENEX HFA  Inhale 1-2 puffs into the lungs every 4 (four) hours as needed for wheezing.     metFORMIN 1000 MG tablet  Commonly known as:  GLUCOPHAGE  Take 1 tablet (1,000 mg total) by mouth daily with breakfast.     sildenafil 100 MG tablet  Commonly known as:  VIAGRA  Take 1 tablet (100 mg total) by mouth daily as needed for erectile dysfunction (pt gets from [employer] note 01/24/14).     spironolactone 25 MG tablet  Commonly known as:  ALDACTONE  TAKE 1/2 TABLET (12.5 MG TOTAL) BY MOUTH DAILY.     torsemide 20 MG tablet  Commonly known as:  DEMADEX  Take 2 tablets (40 mg total) by mouth 2 (two) times daily.     XARELTO 20 MG Tabs tablet  Generic drug:  rivaroxaban  TAKE 1 TABLET (20 MG TOTAL) BY MOUTH DAILY WITH SUPPER.        Disposition and follow-up:   [name] was discharged from [facility] in Good condition.  At the hospital follow up visit please address:  1.  Chronic Resp Failure- Is patient still experiencing dyspnea and DOE? Is he using his CPAP machine every night?  Right Knee Pain- Has pain improved? Is Norco helping? Did he see sports medicine? AKI on CKD- check bmet  Bradycardia- Did Dr. Patty Sermons restart Coreg?  2.  Labs / imaging needed at time of follow-up: bmet  3.  Pending labs/ test needing follow-up: None  Follow-up Appointments:     Follow-up Information    Follow up with [facility], MD On 01/10/2015.   Specialty:  Family Medicine   Why:  9:30 AM, arrive 9 AM please bring your insurance card & photo ID   Contact information:   [address] [place] [address] [phone]       Follow up with Christen Bame, MD.   Specialty:  Internal Medicine   Why:  Appt on Feb 26th at 9:15 AM   Contact information:   [address] [place] [address] [phone]       Discharge Instructions: Discharge Instructions    Diet - low sodium heart healthy    Complete by:  As  directed      Increase activity slowly    Complete by:  As directed           Procedures Performed:  Dg Chest 2 View  01/02/2015   CLINICAL DATA:  Shortness breath for 2 days  EXAM: CHEST  2 VIEW  COMPARISON:  12/24/2014  FINDINGS: Mild cardiomegaly. Vascular congestion with diffuse interstitial prominence is similar to prior study and is stable dating back to 2014. I suspect there is underlying chronic interstitial lung disease. Is difficult to completely exclude interstitial edema. No confluent opacities or effusions. No acute bony abnormality.  IMPRESSION: Stable diffuse interstitial prominence, noted dating back to 2014. This may reflect chronic interstitial lung disease. Recommend clinical correlation to completely exclude interstitial edema.   Electronically Signed   By: Charlett Nose M.D.   On: 01/02/2015 16:45   Dg Chest 2 View  12/24/2014   CLINICAL DATA:  Pt states that she is having left sided chest pains and weakness for the past 3 days, hx of CHF  EXAM: CHEST  2 VIEW  COMPARISON:  07/26/2013  FINDINGS: Cardiac silhouette mildly enlarged. No mediastinal or hilar masses. Is bilateral interstitial thickening including mild thickening of the oblique fissures. No areas of focal lung consolidation. No pleural effusion or pneumothorax.  Bony thorax is demineralized but grossly intact.  IMPRESSION: 1. Interstitial thickening with mild cardiomegaly supports mild congestive heart failure. No evidence of pneumonia.   Electronically Signed   By: Amie Portland M.D.   On: 12/24/2014 09:47   Ct Chest High Resolution  01/03/2015   CLINICAL DATA:  Interstitial lung disease, shortness of breath.  EXAM: CT CHEST WITHOUT CONTRAST  TECHNIQUE: Multidetector CT imaging of the chest was performed following the standard protocol without intravenous contrast. High resolution imaging of the lungs, as well as inspiratory and expiratory imaging, was performed.  COMPARISON:  10/27/2011.  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 The majority of mediastinal lymph nodes are not enlarged by CT size criteria. A 1.9 cm short axis lymph node adjacent to the right atrium is unchanged. Hilar regions are difficult to definitively evaluate without IV contrast. No axillary adenopathy. Pulmonary arteries and heart are enlarged. No pericardial effusion.  Lungs/Pleura: Mosaic attenuation is seen in the lungs. Scattered pulmonary nodules measure up to 8 mm (7 x 9 mm) along the right major fissure, unchanged and therefore benign. No subpleural reticulation, traction bronchiectasis/ bronchiolectasis, architectural distortion or honeycombing. Suspect mild air trapping on  expiratory phase imaging. No pleural fluid. Airway is unremarkable.  Upper abdomen: Visualized portions of the liver, adrenal glands, spleen and stomach are grossly unremarkable.  Musculoskeletal: No worrisome lytic or sclerotic lesions. Degenerative changes are seen in the spine.  IMPRESSION: 1. Probable small airways disease. No evidence of fibrotic interstitial lung disease. 2. Enlarged pulmonary arteries, indicative of pulmonary arterial hypertension.   Electronically Signed   By: Leanna Battles M.D.   On: 01/03/2015 16:07   Dg Knee Complete 4 Views Right  01/03/2015   CLINICAL DATA:  Acute onset of right lateral knee pain. Initial encounter.  EXAM: RIGHT KNEE - COMPLETE 4+ VIEW  COMPARISON:  None.  FINDINGS: There is no evidence of fracture or dislocation. The joint spaces are preserved. Mild wall osteophytes and tibial spine osteophytes are seen; tiny osteophytes are suggested at the patellofemoral compartment, with suggestion of mild cortical irregularity along the articular surface of the patella.  A small knee joint effusion is noted. The visualized soft tissues are otherwise unremarkable in appearance.  IMPRESSION: 1. No evidence of fracture or dislocation. 2. Small knee joint effusion noted. 3. 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   Electronically Signed   By: Roanna Raider M.D.   On: 01/03/2015 02:27    2D Echo:  01/04/15 Study Conclusions - Left ventricle: Poor image quality unable to estimate EF likely at least mildly decreased Suggest MRI or other modality to asses. The cavity size was mildly dilated. Wall thickness was increased in a pattern of mild LVH. - Left atrium: The atrium was moderately dilated.  Admission HPI: 54 yo male with HTN, DM II, aflutter on anticoag, polyneuropathy, diastolic CHF EF 61% 2005 ECHO, depression/anxiety, HLD, DM II comes in with sob with exertion and feels that he gained weight and has swelling on his right knee with pain. Having subjective wheezing as well. He is unable to sleep lying flat. Has knee pain on right knee which he feels is from weight gain. He was out of his meds due to insurance issues but has been taking his demadex for last 1 week. He states he also has some pain around his ribs on both sides starting from his back coming towards the front. He noticed a crusty rash on his back which is resolving. Never had shingles in the past. He was in ED with SOB complaint 2 weeks ago and he said that time he also had some indigestion feeling. pepcid and lasix helped at that time. He takes pepcid occasionally that helps with indigestion but he ate pork skin few days ago and pepcid is not helping much with this. He was on PPi in the past for heart burn which was helping but it was d/ced when he was better.   He has hx of chemical exposues as Artist for 20 years. 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 He denies long term smoking hx, smokes occasionally. He uses inhaler, but not sure if it was for copd or something else. Never had PFT per patient. Never seen a pulmonologist. He has also been on amiodarone for last 1 year for his aflutter with RVR per cardiology. Had DCCV to NSR on 03/2014.   Patient has hx of gout, takes colchyx PRN for attacks, was on  allopurinol but was taken off due to worsening kidney function. Now has right knee pain for last few days making it hard for him to walk. Denies any fever/chills. Denies any urinary complaints. Has been taking ibuprofen frequently for  the knee pain.   He uses CPAP at home occasionally, feels anxious using the cpap mask. Denies any chest pain.  Hospital Course by problem list:  Acute on Chronic Respiratory Failure/Likely Pulmonary Hypertension: Patient presented with a few month history of dyspnea and DOE that progressively worsened over the last few days prior to admission. He reported SOB whether he was at rest or with exertion. Initially there was concern for possible ILD given his history of being an [employer] and exposure to aluminum and phenol as well as his PFTs showing a restrictive process (FEV1/FVC 84, TLC 59%, DLCO 60, no response to bronchodilators). He had a high resolution CT Chest which showed no evidence of pulmonary fibrosis, but did show pulmonary hypertension. A repeat echocardiogram was obtained and was a poor study but did suggest normal R-sided function. He will need to undergo right heart catheterization to confirm diagnosis of pulmonary hypertension. His PAH is likely a result of his OSA. Patient was instructed to use his CPAP machine every night. He also has morbid obesity which is liking contributing to his dyspnea as well. This was discussed with the patient and he agreed to make a bigger effort to exercise once his right knee pain improves. He has follow up with both the IM clinic and Dr. Patty Sermons (cardiology).   Right Knee Pain: Patient initially presented with a 1 month history of right knee pain that progressively worsened and had experienced difficulty walking for 2 days prior to admission. He reported a history of gout and previously had arthrocentesis done in [place]. He stated he was told he had gout due to an elevated uric acid level. Patient had a minimal effusion in  his right knee. IR was consulted for possible arthrocentesis, but were unable to perform this as there was not enough fluid for the procedure. It was determined that the patient's symptoms were likely not due to a gout flare given his pain had been present for over a month and his knee was not erythematous, significantly swollen or tender to palpation. His right knee pain was likely due to a tendonitis or ligament issue in combination with his morbid obesity. His pain continued to improve and by time of discharge he was able to walk around the hospital room without any difficulties. Patient was made a referral appointment to sports medicine. He has an appointment on 01/10/15 with Dr. Althea Charon.   AKI on CKD: Cr 1.51 on admission, increased to 1.58, and then dropped down to 1.28 without any intervention. Patient's baseline Cr unclear as has been fluctuating in 1.1-1.5. He will need a repeat bmet at his IM clinic follow up visit.   Bradycardia: Patient noted to have asymptomatic bradycardia in 40-50s. Patient stated that this was normal for him. His home Coreg 6.25 mg BID was held. Patient was instructed to stop taking this medication until his follow up appointment with Dr. Patty Sermons on 01/12/15.   CHF: Last echo in May 2015 shows EF 50-55% with mildly dilated left atrium. He had no evidence of CHF exacerbation as BNP was 237 on admission and CXR did not show pulmonary edema. Patient had a repeat echocardiogram done, but this study was inconclusive. He was continued on his home Spironolactone 12.5 mg daily and Torsemide 40 mg BID. His home Coreg 6.25 mg BID was held due to bradycardia (see above). He was discharged on his home Spironolactone and Torsemide, but instructed to not take his home Coreg until his follow up appointment with Dr. Patty Sermons  on 01/12/15.   Atrial Flutter: Patient has history of AFlutter but remained in NSR during his hospitalization. He was continued on his home Amiodarone 200 mg daily and  Xarelto 20 mg daily. He was discharged on these medications with no changes.   Type 2 DM with Polyneuropathy: Patient's last HbA1c was 6.4 on 07/28/14. Repeat HbA1c was 7.0. Patient was placed on an insulin sliding scale during his hospitalization. He was also continued on his home Gabapentin 600 mg BID. Upon discharge, patient was instructed to hold his home Metformin for 24 hours since he had contrast exposure during his hospitalization. He was then instructed to resume his home Metformin 1000 mg daily as normal.   Constipation: Patient was continued on his home Colace and Miralax.   Insomnia: Patient was continued on his home Trazodone for sleep.   Discharge Vitals:   BP 104/55 mmHg  Pulse 53  Temp(Src) 98.6 F (37 C) (Oral)  Resp 18  Ht 5\' 7"  (1.702 m)  Wt 278 lb 3.2 oz (126.191 kg)  BMI 43.56 kg/m2  SpO2 95% Physical Exam General: alert, sitting up in bed, pleasant  HEENT: /AT, EOMI, mucus membranes moist CV: RRR, no m/g/r Pulm: CTA bilaterally, breaths non-labored Abd: BS+, soft, obese, non-tender Ext: R knee mildly swollen compared to left, very minimal effusion present. Mild tenderness to palpation present.  Neuro: alert and oriented x 3, no focal deficits  Discharge Labs:  Results for orders placed or performed during the hospital encounter of 01/02/15 (from the past 24 hour(s))  Glucose, capillary     Status: Abnormal   Collection Time: 01/04/15  4:01 PM  Result Value Ref Range   Glucose-Capillary 181 (H) 70 - 99 mg/dL   Comment 1 Notify RN   Glucose, capillary     Status: Abnormal   Collection Time: 01/04/15 10:05 PM  Result Value Ref Range   Glucose-Capillary 150 (H) 70 - 99 mg/dL   Comment 1 Notify RN    Comment 2 Documented in Char   Glucose, capillary     Status: Abnormal   Collection Time: 01/05/15  6:13 AM  Result Value Ref Range   Glucose-Capillary 133 (H) 70 - 99 mg/dL   Comment 1 Notify RN    Comment 2 Documented in Char   Glucose, capillary      Status: Abnormal   Collection Time: 01/05/15 11:26 AM  Result Value Ref Range   Glucose-Capillary 135 (H) 70 - 99 mg/dL   Comment 1 Notify RN     Signed: Rich Number, MD 01/05/2015, 12:39 PM    Services Ordered on Discharge: None Equipment Ordered on Discharge: None

## 2015-01-05 NOTE — Progress Notes (Signed)
INTERNAL MEDICINE TEACHING ATTENDING ADDENDUM - Sander Speckman, MD: I reviewed and discussed at the time of visit with the resident Dr. Sadek, the patient's medical history, physical examination, diagnosis and results of pertinent tests and treatment and I agree with the patient's care as documented.  

## 2015-01-05 NOTE — Progress Notes (Signed)
  Date: 01/05/2015  Patient name: Jeremiah Perkins  Medical record number: [id number]  Date of birth: 09-10-1961   This patient has been seen and the plan of care was discussed with the house staff. Please see their note for complete details. I concur with their findings with the following additions/corrections: ECHO was poor quality but suggested nl R side. He will need F/U with cards as outpt to see if any additional testing to W/U Pul HTN is indicated. He was standing and walking on R knee fine today. D/C home.  Burns Spain, MD 01/05/2015, 12:06 PM

## 2015-01-05 NOTE — Discharge Instructions (Signed)
It was a pleasure taking care of you, Jeremiah Perkins.  Please do not take your Coreg medicine until you have your follow up appointment with Dr. Patty Sermons.  Also, please do not restart your Metformin until 01/06/15.   You have a follow up appointment on Feb 26th at 9:15AM in the Internal Medicine clinic.   Take care, Dr. Beckie Salts

## 2015-01-09 ENCOUNTER — Telehealth: Payer: Self-pay | Admitting: Cardiology

## 2015-01-09 NOTE — Telephone Encounter (Signed)
I returned call, the receptionist said since Dr Patty Sermons was his cardiologist they would need to contact his PCP. They will call back if further need.

## 2015-01-09 NOTE — Telephone Encounter (Signed)
Follow Up    Following up on a fax sent 1/29 regarding a back brace and a knee brace. Please call.

## 2015-01-12 ENCOUNTER — Ambulatory Visit (INDEPENDENT_AMBULATORY_CARE_PROVIDER_SITE_OTHER): Payer: PPO | Admitting: Cardiology

## 2015-01-12 ENCOUNTER — Encounter: Payer: Self-pay | Admitting: Cardiology

## 2015-01-12 VITALS — BP 134/70 | HR 67 | Ht 67.0 in | Wt 285.0 lb

## 2015-01-12 DIAGNOSIS — I5022 Chronic systolic (congestive) heart failure: Secondary | ICD-10-CM

## 2015-01-12 DIAGNOSIS — N179 Acute kidney failure, unspecified: Secondary | ICD-10-CM

## 2015-01-12 DIAGNOSIS — N189 Chronic kidney disease, unspecified: Secondary | ICD-10-CM

## 2015-01-12 DIAGNOSIS — I10 Essential (primary) hypertension: Secondary | ICD-10-CM

## 2015-01-12 MED ORDER — CARVEDILOL 3.125 MG PO TABS: 3.1250 mg | ORAL_TABLET | Freq: Two times a day (BID) | ORAL | Status: DC

## 2015-01-12 NOTE — Progress Notes (Signed)
Cardiology Office Note   Date:  01/12/2015   ID:  Jeremiah Perkins, DOB December 09, 1960, MRN [id number]  PCP:  Christen Bame, MD  Cardiologist:   Cassell Clement, MD   No chief complaint on file.     History of Present Illness: Jeremiah Perkins is a 54 y.o. male who presents for scheduled follow-up office visit.  This pleasant 54 year old African American gentleman is seen for a post hospital followup office visit. The patient underwent elective outpatient cardioversion on 10/26/13. He has remained in normal sinus rhythm. . He has a history of a nonischemic cardiomyopathy with previous catheterization showing no significant obstructive disease. His ejection fraction is estimated at 20-25%. He is diuresing on his current outpatient regimen. He still notes significant dyspnea with minimal exertion. Jeremiah Perkins He does have occasional chest discomfort. The patient also has a history of diabetes mellitus type 2 which is followed at the internal medicine clinic. He has a history of obstructive sleep apnea and morbid obesity and diabetic peripheral neuropathy. He has a past history of hypertension and gout. He has a past history of peripheral neuropathy and is on gabapentin The patient has finished the the cardiac rehabilitation program. He elected not to go into the maintenance phase because it would have been expensive for him. So far he is tolerating exercise without adverse effect. The patient was recently hospitalized with exacerbation of dyspnea and congestive heart failure.  He responded to diuresis.  He was discharged on 01/05/15.  He had an echocardiogram while hospitalized which showed technically poor images but ejection fraction was felt to be depressed.  During the hospitalization his carvedilol was stopped because of bradycardia. Past Medical History  Diagnosis Date  . Hypertension   . Congestive heart failure     No echo data available. Myocardial perfusion 2005 with EF 61%,  presumed diastolic HF   . Diverticulosis     with history of diverticulitis in 10/2011  . Gout   . Major depression   . Anxiety   . Psychosexual dysfunction with inhibited sexual excitement   . Hyperlipidemia   . Atrial flutter   . Morbid obesity   . Chronic bronchitis     "get it q yr" (01/02/2015)  . Shortness of breath     "all the time" (07/26/2013)  . Chronic back pain   . GERD (gastroesophageal reflux disease)   . Asthma   . Myocardial infarction     "one doctor said I did back in 2014"  . Pneumonia 2013; 2014  . OSA (obstructive sleep apnea) 1998    "wear my CPAP a couple times/month" (01/02/2015)  . Diabetic peripheral neuropathy   . Type II diabetes mellitus dx'd 1991    previously on insulin in 2000 for 3-4 years (but was stopped because adamantly did not want to be on insulin)  . Arthritis     involving knees, ankles, back, elbows (01/02/2015)    Past Surgical History  Procedure Laterality Date  . Ankle reconstruction Right 1990's    2/2 trauma sustained after fall  . 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 Cyst removal leg Left 1960's    back of  leg  . Tee without cardioversion N/A 07/28/2013    Procedure: TRANSESOPHAGEAL ECHOCARDIOGRAM (TEE);  Surgeon: Laurey Morale, MD;  Location[facility] ENDOSCOPY;  Service: Cardiovascular;  Laterality: N/A;  . Cardioversion N/A 07/28/2013    Procedure: CARDIOVERSION;  Surgeon: Laurey Morale, MD;  Location[facility] ENDOSCOPY;  Service: Cardiovascular;  Laterality: N/A;  . Cardioversion N/A 10/26/2013    Procedure: CARDIOVERSION;  Surgeon: Cassell Clement, MD;  Location: [facility] ENDOSCOPY;  Service: Cardiovascular;  Laterality: N/A;  . Left heart catheterization with coronary angiogram N/A 07/27/2013    Procedure: LEFT HEART CATHETERIZATION WITH CORONARY ANGIOGRAM;  Surgeon: Peter M Swaziland, MD;  Location: [facility] CATH LAB;  Service: Cardiovascular;  Laterality: N/A;  . Cardiac catheterization  1990's; 07/2013     Current Outpatient Prescriptions    Medication Sig Dispense Refill  . amiodarone (PACERONE) 200 MG tablet TAKE 1 TABLET (200 MG TOTAL) BY MOUTH DAILY. 30 tablet 0  . aspirin-sod bicarb-citric acid (ALKA-SELTZER) 325 MG TBEF tablet Take 325 mg by mouth every 6 (six) hours as needed (heartburn).    Jeremiah Perkins atorvastatin (LIPITOR) 40 MG tablet Take 1 tablet (40 mg total) by mouth daily. 30 tablet 2  . Benzalkonium Chloride 50 % SOLN Clean scrotal area daily. 100 mL 0  . bismuth subsalicylate (PEPTO BISMOL) 262 MG/15ML suspension Take 30 mLs by mouth every 6 (six) hours as needed for indigestion.    . diclofenac sodium (VOLTAREN) 1 % GEL Apply 2 g topically 4 (four) times daily.    Jeremiah Perkins docusate sodium (COLACE) 100 MG capsule Take 100 mg by mouth daily as needed for mild constipation. For constipation    . famotidine (PEPCID) 20 MG tablet Take 1 tablet (20 mg total) by mouth 2 (two) times daily. 30 tablet 0  . gabapentin (NEURONTIN) 600 MG tablet Take 1 tablet (600 mg total) by mouth 2 (two) times daily. Takes  in the am &  in the pm 60 tablet 5  . HYDROcodone-acetaminophen (NORCO/VICODIN) 5-325 MG per tablet Take 1-2 tablets by mouth every 6 (six) hours as needed for moderate pain. 60 tablet 0  . HYDROcodone-acetaminophen (NORCO/VICODIN) 5-325 MG per tablet Take 1-2 tablets by mouth every 6 (six) hours as needed for moderate pain. 30 tablet 0  . levalbuterol (XOPENEX HFA) 45 MCG/ACT inhaler Inhale 1-2 puffs into the lungs every 4 (four) hours as needed for wheezing. 1 Inhaler 12  . metFORMIN (GLUCOPHAGE) 1000 MG tablet Take 1 tablet (1,000 mg total) by mouth daily with breakfast. 60 tablet 11  . sildenafil (VIAGRA) 100 MG tablet Take 1 tablet (100 mg total) by mouth daily as needed for erectile dysfunction (pt gets from [employer] note 01/24/14). 10 tablet 3  . spironolactone (ALDACTONE) 25 MG tablet TAKE 1/2 TABLET (12.5 MG TOTAL) BY MOUTH DAILY. 15 tablet 2  . torsemide (DEMADEX) 20 MG tablet Take 2 tablets (40 mg total) by mouth 2 (two)  times daily. 60 tablet 6  . XARELTO 20 MG TABS tablet TAKE 1 TABLET (20 MG TOTAL) BY MOUTH DAILY WITH SUPPER. 30 tablet 3  . carvedilol (COREG) 3.125 MG tablet Take 1 tablet (3.125 mg total) by mouth 2 (two) times daily. 60 tablet 5   No current facility-administered medications for this visit.    Allergies:   Eggs or egg-derived products and Sulfa antibiotics    Social History:  The patient  reports that he has been smoking Cigarettes.  He has a 1.2 pack-year smoking history. He has never used smokeless tobacco. He reports that he drinks alcohol. He reports that he uses illicit drugs (Marijuana).   Family History:  The patient's family history includes Alzheimer's disease in his maternal grandmother and mother; Breast cancer in his paternal aunt; CAD (age of onset: 105) in his brother; CAD (age of onset: 71) in his mother;  Diabetes in his maternal aunt, maternal grandmother, and mother; Gout in his father; Hypertension in his brother and mother; Lung cancer in his father; Seizures in his brother and paternal uncle; Stomach cancer in his father.    ROS:  Please see the history of present illness.   Otherwise, review of systems are positive for none.   All other systems are reviewed and negative.    PHYSICAL EXAM: VS:  BP 134/70 mmHg  Pulse 67  Ht  (1.702 m)  Wt 285 lb (129.275 kg)  BMI 44.63 kg/m2 , BMI Body mass index is 44.63 kg/(m^2). GEN: Well nourished, well developed, in no acute distress HEENT: normal Neck: no JVD, carotid bruits, or masses Cardiac: RRR; no murmurs, rubs, or gallops,no edema  Respiratory:  clear to auscultation bilaterally, normal work of breathing GI: soft, nontender, nondistended, + BS MS: no deformity or atrophy Skin: warm and dry, no rash Neuro:  Strength and sensation are intact Psych: euthymic mood, full affect   EKG:  EKG is ordered today. The ekg ordered today demonstrates sinus rhythm at 65 bpm with first-degree AV block.  Low voltage QRS.  No  acute ischemic changes.   Recent Labs: 01/31/2014: Pro B Natriuretic peptide (BNP) 54.0 01/02/2015: B Natriuretic Peptide 237.2*; Hemoglobin 14.3; Platelets 290 01/03/2015: ALT 14; TSH 5.852* 01/04/2015: BUN 28*; Creatinine 1.28; Potassium 4.1; Sodium 137    Lipid Panel    Component Value Date/Time   CHOL 186 01/04/2015 0518   TRIG 198* 01/04/2015 0518   HDL 33* 01/04/2015 0518   CHOLHDL 5.6 01/04/2015 0518   VLDL 40 01/04/2015 0518   LDLCALC 113* 01/04/2015 0518      Wt Readings from Last 3 Encounters:  01/12/15 285 lb (129.275 kg)  01/05/15 278 lb 3.2 oz (126.191 kg)  01/02/15 283 lb 6.4 oz (128.549 kg)        ASSESSMENT AND PLAN:  1.  Paroxysmal atrial flutter, maintaining normal sinus rhythm on amiodarone 2.  Chronic systolic heart failure 3.  Diabetic peripheral neuropathy 4.  Morbid obesity   Current medicines are reviewed at length with the patient today.  The patient does not have concerns regarding medicines.  The following changes have been made:  Restart carvedilol at a lower dose of 3.125 mg twice a day.   Orders Placed This Encounter  Procedures  . Basic metabolic panel  . EKG 12-Lead     Disposition:   FU with Dr. Patty Sermons in 2 months for office visit and basal metabolic panel   Signed, Cassell Clement, MD  01/12/2015 7:04 PM    [facility] [address] [place], [place], [place]  [address] Phone: [phone]; Fax: [phone]

## 2015-01-12 NOTE — Patient Instructions (Addendum)
RESUME YOUR CARVEDILOL AT 3.125 MG TWICE A DAY  Your physician recommends that you schedule a follow-up appointment in: 2 MONTH OV/BMET  WORK HARDER ON DIET AND WEIGHT LOSS

## 2015-01-17 ENCOUNTER — Other Ambulatory Visit: Payer: Self-pay | Admitting: *Deleted

## 2015-01-17 MED ORDER — ONETOUCH ULTRA SYSTEM W/DEVICE KIT: 1.0000 | PACK | Freq: Once | Status: DC

## 2015-01-18 ENCOUNTER — Other Ambulatory Visit: Payer: Self-pay | Admitting: *Deleted

## 2015-01-18 ENCOUNTER — Other Ambulatory Visit: Payer: Self-pay | Admitting: Internal Medicine

## 2015-01-18 MED ORDER — FAMOTIDINE 20 MG PO TABS: 20.0000 mg | ORAL_TABLET | Freq: Two times a day (BID) | ORAL | Status: DC

## 2015-01-18 NOTE — Telephone Encounter (Signed)
Pt takes bid so will need # 60 a month

## 2015-01-19 ENCOUNTER — Ambulatory Visit: Payer: PPO | Admitting: Internal Medicine

## 2015-01-26 ENCOUNTER — Telehealth: Payer: Self-pay | Admitting: Dietician

## 2015-01-26 ENCOUNTER — Ambulatory Visit (INDEPENDENT_AMBULATORY_CARE_PROVIDER_SITE_OTHER): Payer: PPO | Admitting: Internal Medicine

## 2015-01-26 ENCOUNTER — Encounter: Payer: Self-pay | Admitting: Internal Medicine

## 2015-01-26 VITALS — BP 130/59 | HR 69 | Temp 97.8°F | Ht 67.0 in | Wt 274.5 lb

## 2015-01-26 DIAGNOSIS — M10022 Idiopathic gout, left elbow: Secondary | ICD-10-CM

## 2015-01-26 DIAGNOSIS — M1990 Unspecified osteoarthritis, unspecified site: Secondary | ICD-10-CM

## 2015-01-26 DIAGNOSIS — N179 Acute kidney failure, unspecified: Secondary | ICD-10-CM

## 2015-01-26 DIAGNOSIS — E118 Type 2 diabetes mellitus with unspecified complications: Secondary | ICD-10-CM

## 2015-01-26 DIAGNOSIS — M1000 Idiopathic gout, unspecified site: Secondary | ICD-10-CM

## 2015-01-26 LAB — GLUCOSE, CAPILLARY: Glucose-Capillary: 143 mg/dL — ABNORMAL HIGH (ref 70–99)

## 2015-01-26 LAB — CBC WITH DIFFERENTIAL/PLATELET
BASOS ABS: 0 10*3/uL (ref 0.0–0.1)
Basophils Relative: 0 % (ref 0–1)
EOS ABS: 0.1 10*3/uL (ref 0.0–0.7)
Eosinophils Relative: 2 % (ref 0–5)
HCT: 45.1 % (ref 39.0–52.0)
Hemoglobin: 14.9 g/dL (ref 13.0–17.0)
Lymphocytes Relative: 17 % (ref 12–46)
Lymphs Abs: 1.3 10*3/uL (ref 0.7–4.0)
MCH: 28.6 pg (ref 26.0–34.0)
MCHC: 33 g/dL (ref 30.0–36.0)
MCV: 86.6 fL (ref 78.0–100.0)
MPV: 10 fL (ref 8.6–12.4)
Monocytes Absolute: 0.4 10*3/uL (ref 0.1–1.0)
Monocytes Relative: 6 % (ref 3–12)
NEUTROS ABS: 5.6 10*3/uL (ref 1.7–7.7)
Neutrophils Relative %: 75 % (ref 43–77)
PLATELETS: 293 10*3/uL (ref 150–400)
RBC: 5.21 MIL/uL (ref 4.22–5.81)
RDW: 14.8 % (ref 11.5–15.5)
WBC: 7.4 10*3/uL (ref 4.0–10.5)

## 2015-01-26 LAB — URIC ACID: URIC ACID, SERUM: 11.5 mg/dL — AB (ref 4.0–7.8)

## 2015-01-26 LAB — BASIC METABOLIC PANEL WITH GFR
BUN: 29 mg/dL — ABNORMAL HIGH (ref 6–23)
CALCIUM: 9.3 mg/dL (ref 8.4–10.5)
CO2: 24 meq/L (ref 19–32)
Chloride: 103 mEq/L (ref 96–112)
Creat: 1.16 mg/dL (ref 0.50–1.35)
GFR, EST NON AFRICAN AMERICAN: 70 mL/min
GFR, Est African American: 81 mL/min
Glucose, Bld: 122 mg/dL — ABNORMAL HIGH (ref 70–99)
Potassium: 4.1 mEq/L (ref 3.5–5.3)
SODIUM: 141 meq/L (ref 135–145)

## 2015-01-26 MED ORDER — HYDROCODONE-ACETAMINOPHEN 5-325 MG PO TABS: 1.0000 | ORAL_TABLET | Freq: Four times a day (QID) | ORAL | Status: DC | PRN

## 2015-01-26 MED ORDER — COLCHICINE 0.6 MG PO TABS: ORAL_TABLET | ORAL | Status: DC

## 2015-01-26 NOTE — Progress Notes (Signed)
Medicine attending: I personally interviewed and briefly examined this patient and reviewed pertinent laboratory and radiographic data together with resident physician Dr.Kathyrn Sherrine Maples and we formulated a  management plan. 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 On Xarelto anticoagulation eg NSAIDS contraindicated.  Will begin colchicine. oxycodone for pain until acute inflammation subsides. Obtain report of recent right elbow joint aspiration from Orthopedic Surgery.

## 2015-01-26 NOTE — Patient Instructions (Signed)
Take Colchicine 1.2mg  (2 tablets) now and then 0.6mg  (1 tablet) 1 hour later, then starting tomorrow, begin taking 0.6mg  daily.  Keep you appointment with Dr. Burtis Junes next Friday for further evaluation.   If you develop fevers, chills, or worsening pain, please call the clinic or go to the ER for further evaluation      Gout Gout is an inflammatory arthritis caused by a buildup of uric acid crystals in the joints. Uric acid is a chemical that is normally present in the blood. When the level of uric acid in the blood is too high it can form crystals that deposit in your joints and tissues. This causes joint redness, soreness, and swelling (inflammation). Repeat attacks are common. Over time, uric acid crystals can form into masses (tophi) near a joint, destroying bone and causing disfigurement. Gout is treatable and often preventable. CAUSES  The disease begins with elevated levels of uric acid in the blood. Uric acid is produced by your body when it breaks down a naturally found substance called purines. Certain foods you eat, such as meats and fish, contain high amounts of purines. Causes of an elevated uric acid level include:  Being passed down from parent to child (heredity).  Diseases that cause increased uric acid production (such as obesity, psoriasis, and certain cancers).  Excessive alcohol use.  Diet, especially diets rich in meat and seafood.  Medicines, including certain cancer-fighting medicines (chemotherapy), water pills (diuretics), and aspirin.  Chronic kidney disease. The kidneys are no longer able to remove uric acid well.  Problems with metabolism. Conditions strongly associated with gout include:  Obesity.  High blood pressure.  High cholesterol.  Diabetes. Not everyone with elevated uric acid levels gets gout. It is not understood why some people get gout and others do not. Surgery, joint injury, and eating too much of certain foods are some of the factors that  can lead to gout attacks. SYMPTOMS   An attack of gout comes on quickly. It causes intense pain with redness, swelling, and warmth in a joint.  Fever can occur.  Often, only one joint is involved. Certain joints are more commonly involved:  Base of the big toe.  Knee.  Ankle.  Wrist.  Finger. Without treatment, an attack usually goes away in a few days to weeks. Between attacks, you usually will not have symptoms, which is different from many other forms of arthritis. DIAGNOSIS  Your caregiver will suspect gout based on your symptoms and exam. In some cases, tests may be recommended. The tests may include:  Blood tests.  Urine tests.  X-rays.  Joint fluid exam. This exam requires a needle to remove fluid from the joint (arthrocentesis). Using a microscope, gout is confirmed when uric acid crystals are seen in the joint fluid. TREATMENT  There are two phases to gout treatment: treating the sudden onset (acute) attack and preventing attacks (prophylaxis).  Treatment of an Acute Attack.  Medicines are used. These include anti-inflammatory medicines or steroid medicines.  An injection of steroid medicine into the affected joint is sometimes necessary.  The painful joint is rested. Movement can worsen the arthritis.  You may use warm or cold treatments on painful joints, depending which works best for you.  Treatment to Prevent Attacks.  If you suffer from frequent gout attacks, your caregiver may advise preventive medicine. These medicines are started after the acute attack subsides. These medicines either help your kidneys eliminate uric acid from your body or decrease your uric acid production.  You may need to stay on these medicines for a very long time.  The early phase of treatment with preventive medicine can be associated with an increase in acute gout attacks. For this reason, during the first few months of treatment, your caregiver may also advise you to take  medicines usually used for acute gout treatment. Be sure you understand your caregiver's directions. Your caregiver may make several adjustments to your medicine dose before these medicines are effective.  Discuss dietary treatment with your caregiver or dietitian. Alcohol and drinks high in sugar and fructose and foods such as meat, poultry, and seafood can increase uric acid levels. Your caregiver or dietitian can advise you on drinks and foods that should be limited. HOME CARE INSTRUCTIONS   Do not take aspirin to relieve pain. This raises uric acid levels.  Only take over-the-counter or prescription medicines for pain, discomfort, or fever as directed by your caregiver.  Rest the joint as much as possible. When in bed, keep sheets and blankets off painful areas.  Keep the affected joint raised (elevated).  Apply warm or cold treatments to painful joints. Use of warm or cold treatments depends on which works best for you.  Use crutches if the painful joint is in your leg.  Drink enough fluids to keep your urine clear or pale yellow. This helps your body get rid of uric acid. Limit alcohol, sugary drinks, and fructose drinks.  Follow your dietary instructions. Pay careful attention to the amount of protein you eat. Your daily diet should emphasize fruits, vegetables, whole grains, and fat-free or low-fat milk products. Discuss the use of coffee, vitamin C, and cherries with your caregiver or dietitian. These may be helpful in lowering uric acid levels.  Maintain a healthy body weight. SEEK MEDICAL CARE IF:   You develop diarrhea, vomiting, or any side effects from medicines.  You do not feel better in 24 hours, or you are getting worse. SEEK IMMEDIATE MEDICAL CARE IF:   Your joint becomes suddenly more tender, and you have chills or a fever. MAKE SURE YOU:   Understand these instructions.  Will watch your condition.  Will get help right away if you are not doing well or get  worse. Document Released: 11/07/2000 Document Revised: 03/27/2014 Document Reviewed: 06/23/2012 [facility] Patient Information 2015 [place], [place]. This information is not intended to replace advice given to you by your health care provider. Make sure you discuss any questions you have with your health care provider. Colchicine tablets or capsules What is this medicine? COLCHICINE (KOL chi seen) is for joint pain and swelling due to attacks of acute gouty arthritis. The medicine is also used to treat familial Mediterranean fever. This medicine may be used for other purposes; ask your health care provider or pharmacist if you have questions. COMMON BRAND NAME(S): Colcrys, MITIGARE What should I tell my health care provider before I take this medicine? They need to know if you have any of these conditions: -anemia -blood disorders like leukemia or lymphoma -heart disease -immune system problems -intestinal disease -kidney disease -liver disease -muscle pain or weakness -take other medicines -stomach problems -an unusual or allergic reaction to colchicine, other medicines, lactose, foods, dyes, or preservatives -pregnant or trying to get pregnant -breast-feeding How should I use this medicine? Take this medicine by mouth with a full glass of water. Follow the directions on the prescription label. You can take it with or without food. If it upsets your stomach, take it with food. Take  your medicine at regular intervals. Do not take your medicine more often than directed. A special MedGuide will be given to you by the pharmacist with each prescription and refill. Be sure to read this information carefully each time. Talk to your pediatrician regarding the use of this medicine in children. While this drug may be prescribed for children as young as 41 years old for selected conditions, precautions do apply. Patients over 40 years old may have a stronger reaction and need a smaller  dose. Overdosage: If you think you have taken too much of this medicine contact a poison control center or emergency room at once. NOTE: This medicine is only for you. Do not share this medicine with others. What if I miss a dose? If you miss a dose, take it as soon as you can. If it is almost time for your next dose, take only that dose. Do not take double or extra doses. What may interact with this medicine? Do not take this medicine with any of the following medications: -certain medicines for fungal infections like itraconazole This medicine may also interact with the following medications: -alcohol -certain medicines for cholesterol like atorvastatin -certain medicines for coughs and colds -certain medicines to help you breathe better -cyclosporine -digoxin -epinephrine -grapefruit or grapefruit juice -methenamine -other medicines for fungal infection -sodium bicarbonate -some antibiotics like clarithromycin, erythromycin, and telithromycin -some medicines for an irregular heartbeat or other heart problems -some medicines for cancer, like lapatinib and tamoxifen -some medicines for HIV This list may not describe all possible interactions. Give your health care provider a list of all the medicines, herbs, non-prescription drugs, or dietary supplements you use. Also tell them if you smoke, drink alcohol, or use illegal drugs. Some items may interact with your medicine. What should I watch for while using this medicine? Visit your doctor or health care professional for regular checks on your progress. You may need periodic blood checks. Alcohol can increase the chance of getting stomach problems and gout attacks. Do not drink alcohol. What side effects may I notice from receiving this medicine? Side effects that you should report to your doctor or health care professional as soon as possible: -allergic reactions like skin rash, itching or hives, swelling of the face, lips, or  tongue -fever, chills, or sore throat -muscle tenderness, pain, or weakness -numbness or tingling in hands or feet -unusual bleeding or bruising -unusually weak or tired -vomiting Side effects that usually do not require medical attention (report to your doctor or health care professional if they continue or are bothersome): -diarrhea -hair loss -loss of appetite -stomach pain or nausea This list may not describe all possible side effects. Call your doctor for medical advice about side effects. You may report side effects to FDA at 1-800-FDA-1088. Where should I keep my medicine? Keep out of the reach of children. Store at room temperature between 15 and 30 degrees C (59 and 86 degrees F). Keep container tightly closed. Protect from light. Throw away any unused medicine after the expiration date. NOTE: This sheet is a summary. It may not cover all possible information. If you have questions about this medicine, talk to your doctor, pharmacist, or health care provider.  2015, Elsevier/Gold Standard. (2013-05-09 16:48:38)  General Instructions:   Please bring your medicines with you each time you come to clinic.  Medicines may include prescription medications, over-the-counter medications, herbal remedies, eye drops, vitamins, or other pills.   Progress Toward Treatment Goals:  Treatment Goal 11/30/2014  Hemoglobin A1C at goal  Blood pressure at goal  Stop smoking -    Self Care Goals & Plans:  Self Care Goal 11/30/2014  Manage my medications take my medicines as prescribed; bring my medications to every visit; refill my medications on time  Monitor my health keep track of my blood glucose; bring my glucose meter and log to each visit  Eat healthy foods drink diet soda or water instead of juice or soda; eat more vegetables; eat foods that are low in salt; eat baked foods instead of fried foods  Be physically active -  Stop smoking -    Home Blood Glucose Monitoring 01/17/2013   Check my blood sugar once a day  When to check my blood sugar before breakfast     Care Management & Community Referrals:  Referral 01/17/2013  Referrals made for care management support none needed

## 2015-01-26 NOTE — Assessment & Plan Note (Signed)
Improved prior to hospital discharge. Checking BMP today to assess renal function.

## 2015-01-26 NOTE — Progress Notes (Signed)
Patient ID: Jeremiah Perkins, Perkins   DOB: 13-Jun-1961, 54 y.o.   MRN: [id number]  Subjective:   Patient ID: Jeremiah Perkins   DOB: June 26, 1961 54 y.o.   MRN: [id number]  HPI: Jeremiah Perkins is a 54 y.o. M w/ PMH HTN, DM II, aflutter on anticoag, polyneuropathy, diastolic CHF EF 27% 7412 ECHO, depression/anxiety, HLD, DM II who presents for an acute visit for elbow pain.  He states that a knot appeared on his left elbow this morning and is painful. He also endorses pain in his right elbow and bilateral knee pain. He is out of pain medication and wants a refill. He does not have a pain contract. He had an appt with Sports Medicine 2/17 and had a steroid injection in his right elbow, and fluid was withdrawn and sent to look for crystals, per pt.   Of note, he was discharged from the hospital 2/12 after being admitted with worsening DOE thought to be 2/2 pulmonary hypertension. He saw Dr. Mare Ferrari with Cardiology 2/19 and his carvedilol was restarted at a lower dose of 3.184m BID. He was asked to f/u in 2 mo. During that hospitalization, he was found to have an AKI w/ Cr up to 1.58, which improved to 1.28 prior to discharge.   Today he states that his breathing is doing well and denies DOE. He only c/o pain in his elbows and knees bilaterally.    Past Medical History  Diagnosis Date  . Hypertension   . Congestive heart failure     No echo data available. Myocardial perfusion 2005 with EF 61%, presumed diastolic HF   . Diverticulosis     with history of diverticulitis in 10/2011  . Gout   . Major depression   . Anxiety   . Psychosexual dysfunction with inhibited sexual excitement   . Hyperlipidemia   . Atrial flutter   . Morbid obesity   . Chronic bronchitis     "get it q yr" (01/02/2015)  . Shortness of breath     "all the time" (07/26/2013)  . Chronic back pain   . GERD (gastroesophageal reflux disease)   . Asthma   . Myocardial infarction     "one doctor said I did back in  2014"  . Pneumonia 2013; 2014  . OSA (obstructive sleep apnea) 1998    "wear my CPAP a couple times/month" (01/02/2015)  . Diabetic peripheral neuropathy   . Type II diabetes mellitus dx'd 1991    previously on insulin in 2000 for 3-4 years (but was stopped because adamantly did not want to be on insulin)  . Arthritis     involving knees, ankles, back, elbows (01/02/2015)   Current Outpatient Prescriptions  Medication Sig Dispense Refill  . amiodarone (PACERONE) 200 MG tablet TAKE 1 TABLET (200 MG TOTAL) BY MOUTH DAILY. 30 tablet 0  . aspirin-sod bicarb-citric acid (ALKA-SELTZER) 325 MG TBEF tablet Take 325 mg by mouth every 6 (six) hours as needed (heartburn).    .Marland Kitchenatorvastatin (LIPITOR) 40 MG tablet Take 1 tablet (40 mg total) by mouth daily. 30 tablet 2  . Benzalkonium Chloride 50 % SOLN Clean scrotal area daily. 100 mL 0  . bismuth subsalicylate (PEPTO BISMOL) 262 MG/15ML suspension Take 30 mLs by mouth every 6 (six) hours as needed for indigestion.    . Blood Glucose Monitoring Suppl (ONE TOUCH ULTRA SYSTEM KIT) W/DEVICE KIT 1 kit by Does not apply route once. 1 each 3  . carvedilol (COREG) 3.125  MG tablet Take 1 tablet (3.125 mg total) by mouth 2 (two) times daily. 60 tablet 5  . diclofenac sodium (VOLTAREN) 1 % GEL Apply 2 g topically 4 (four) times daily.    Marland Kitchen docusate sodium (COLACE) 100 MG capsule Take 100 mg by mouth daily as needed for mild constipation. For constipation    . famotidine (PEPCID) 20 MG tablet Take 1 tablet (20 mg total) by mouth 2 (two) times daily. 60 tablet 0  . gabapentin (NEURONTIN) 600 MG tablet Take 1 tablet (600 mg total) by mouth 2 (two) times daily. Takes 636m in the am & 12017min the pm 60 tablet 5  . HYDROcodone-acetaminophen (NORCO/VICODIN) 5-325 MG per tablet Take 1-2 tablets by mouth every 6 (six) hours as needed for moderate pain. 60 tablet 0  . HYDROcodone-acetaminophen (NORCO/VICODIN) 5-325 MG per tablet Take 1-2 tablets by mouth every 6 (six) hours  as needed for moderate pain. 30 tablet 0  . levalbuterol (XOPENEX HFA) 45 MCG/ACT inhaler Inhale 1-2 puffs into the lungs every 4 (four) hours as needed for wheezing. 1 Inhaler 12  . metFORMIN (GLUCOPHAGE) 1000 MG tablet Take 1 tablet (1,000 mg total) by mouth daily with breakfast. 60 tablet 11  . sildenafil (VIAGRA) 100 MG tablet Take 1 tablet (100 mg total) by mouth daily as needed for erectile dysfunction (pt gets from [facility] [employer] 01/24/14). 10 tablet 3  . spironolactone (ALDACTONE) 25 MG tablet TAKE 1/2 TABLET (12.5 MG TOTAL) BY MOUTH DAILY. 15 tablet 2  . torsemide (DEMADEX) 20 MG tablet Take 2 tablets (40 mg total) by mouth 2 (two) times daily. 60 tablet 6  . XARELTO 20 MG TABS tablet TAKE 1 TABLET (20 MG TOTAL) BY MOUTH DAILY WITH SUPPER. 30 tablet 3   No current facility-administered medications for this visit.   Family History  Problem Relation Age of Onset  . Diabetes Mother   . CAD Mother 7048  requiring quadruple bypass  . Hypertension Mother   . Alzheimer's disease Mother   . Stomach cancer Father   . Lung cancer Father     was a smoker  . Gout Father   . CAD Brother 4853  requiring quadruple bypass  . Seizures Brother   . Hypertension Brother   . Diabetes Maternal Grandmother   . Alzheimer's disease Maternal Grandmother   . Breast cancer Paternal Aunt   . Diabetes Maternal Aunt   . Seizures Paternal Uncle    History   Social History  . Marital Status: Married    Spouse Name: N/A  . Number of Children: 3  . Years of Education: 14   Occupational History  . Now unemployed     AiConservation officer, [employer].  [employer]   Social History Main Topics  . Smoking status: Current Some Day Smoker -- 0.12 packs/day for 10 years    Types: Cigarettes  . Smokeless tobacco: Never Used     Comment: rarely.  . Alcohol Use: 0.0 oz/week    0 Standard drinks or equivalent per week     Comment: Rarely.  . Drug Use: No  . Sexual Activity: Not on file   Other Topics Concern  .  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 They each have children but none between the two of them.   Review of Systems: A 12 point ROS was performed; pertinent positives and negatives are noted  below  Constitutional: Denies fever, chills. +malaise  Respiratory: Denies SOB, DOE Cardiovascular: Denies chest pain Gastrointestinal: +diarrhea that has resolved Genitourinary: Denies dysuria Musculoskeletal: +arthralgias and gait problem.  Skin: +new nodule on left elbow with tenderness.   Neurological: Denies dizziness or syncope   Objective:  Physical Exam: Filed Vitals:   01/26/15 1439  BP: 130/59  Pulse: 69  Temp: 97.8 F (36.6 C)  TempSrc: Oral  Height: 5' 7" (1.702 m)  Weight: 274 lb 8 oz (124.512 kg)  SpO2: 98%   Constitutional: Vital signs reviewed.  Patient is a well-developed and well-nourished Perkins in no acute distress and cooperative with exam. Alert and oriented x3.  Head: Normocephalic and atraumatic Eyes: EOMI. No scleral icterus.  Neck: Normal ROM Cardiovascular: RRR, no MRG Pulmonary/Chest: Normal respiratory effort, CTAB, no wheezes, rales, or rhonchi Abdominal: Soft. Non-tender, non-distended, bowel sounds are normal Musculoskeletal: +left elbow and bilateral knees with tenderness to palpation. Decreased ROM of knees 2/2 pain, good ROM of bilateral elbows.  Neurological: A&O x3, Strength is normal and symmetric bilaterally, cranial nerve II-XII are grossly intact, no focal motor deficit Skin: Left elbow with warmth and nodule at elbow joint and nodule of proximal forearm on dorsal surface with tenderness to palpation but no appreciable fluctuance.   Psychiatric: Normal mood and affect. Speech and behavior is normal.   Assessment & Plan:   Please refer to Problem List based Assessment and Plan

## 2015-01-26 NOTE — Assessment & Plan Note (Addendum)
Acute pain in the L elbow with nodules. Warmth and tenderness present. Pt with h/o gout but was taken off colchicine and allopurinol, per pt 2/2 his renal function. Last uric acid was 8.1 in '14. Differential includes acute gout flare vs infection. Pt afebrile with normal vital signs and has full ROM of the elbow, so septic joint is thought to be less likely than acute gout flare.  - Checking CBC and uric acid (which can be falsely lower in an acute flare) - Starting Colchicine 1.2mg  now and 0.6mg  1 hr later, then 0.6mg  daily x3 mo - Avoiding NSAIDs in the setting of Xarelto use due to bleeding risk.  - Vicodin 5-325mg  1-2 tablets q6h PRN pain, disp #60, 0 refills.  - F/u in 1 week - Trying to get medical records from [facility] regarding joint aspiration results.

## 2015-01-26 NOTE — Assessment & Plan Note (Signed)
Pt also c/o pain in his knees and R elbow. Gout could be contributing, but the knee pain is likely due to osteoarthritis in the setting of morbid obesity.  - Refilled Vicodin 5-325mg  1-2 tablets q6h PRN pain, disp #60, 0 refills.

## 2015-01-26 NOTE — Telephone Encounter (Signed)
Called patient to offer medical nutrition therapy appointment. He scheduled an appointment for follow up to Medical Nutrition Therapy for weight loss. Request referral from PCP

## 2015-01-29 ENCOUNTER — Other Ambulatory Visit (HOSPITAL_COMMUNITY): Payer: Self-pay | Admitting: Cardiology

## 2015-01-29 ENCOUNTER — Other Ambulatory Visit: Payer: Self-pay | Admitting: *Deleted

## 2015-01-29 DIAGNOSIS — I5022 Chronic systolic (congestive) heart failure: Secondary | ICD-10-CM

## 2015-01-29 MED ORDER — TORSEMIDE 20 MG PO TABS: 40.0000 mg | ORAL_TABLET | Freq: Two times a day (BID) | ORAL | Status: DC

## 2015-02-02 ENCOUNTER — Ambulatory Visit: Payer: PPO | Admitting: Dietician

## 2015-02-02 ENCOUNTER — Ambulatory Visit (INDEPENDENT_AMBULATORY_CARE_PROVIDER_SITE_OTHER): Payer: PPO | Admitting: Internal Medicine

## 2015-02-02 ENCOUNTER — Encounter: Payer: Self-pay | Admitting: Internal Medicine

## 2015-02-02 VITALS — BP 125/73 | HR 65 | Temp 98.0°F | Ht 67.0 in | Wt 271.0 lb

## 2015-02-02 DIAGNOSIS — E118 Type 2 diabetes mellitus with unspecified complications: Secondary | ICD-10-CM

## 2015-02-02 DIAGNOSIS — M109 Gout, unspecified: Secondary | ICD-10-CM

## 2015-02-02 DIAGNOSIS — M10021 Idiopathic gout, right elbow: Secondary | ICD-10-CM

## 2015-02-02 LAB — GLUCOSE, CAPILLARY: GLUCOSE-CAPILLARY: 132 mg/dL — AB (ref 70–99)

## 2015-02-02 NOTE — Progress Notes (Signed)
Case discussed with Dr. Sadek at the time of the visit.  We reviewed the resident's history and exam and pertinent patient test results.  I agree with the assessment, diagnosis, and plan of care documented in the resident's note. 

## 2015-02-02 NOTE — Patient Instructions (Addendum)
General Instructions:   Thank you for bringing your medicines today. This helps [place] keep you safe from mistakes.   Progress Toward Treatment Goals:  Treatment Goal 11/30/2014  Hemoglobin A1C at goal  Blood pressure at goal  Stop smoking -    Self Care Goals & Plans:  Self Care Goal 02/02/2015  Manage my medications take my medicines as prescribed; bring my medications to every visit; refill my medications on time  Monitor my health -  Eat healthy foods drink diet soda or water instead of juice or soda; eat more vegetables; eat foods that are low in salt; eat baked foods instead of fried foods; eat fruit for snacks and desserts  Be physically active -  Stop smoking -    Home Blood Glucose Monitoring 01/17/2013  Check my blood sugar once a day  When to check my blood sugar before breakfast     Care Management & Community Referrals:  Referral 01/17/2013  Referrals made for care management support none needed       Gout Gout is an inflammatory arthritis caused by a buildup of uric acid crystals in the joints. Uric acid is a chemical that is normally present in the blood. When the level of uric acid in the blood is too high it can form crystals that deposit in your joints and tissues. This causes joint redness, soreness, and swelling (inflammation). Repeat attacks are common. Over time, uric acid crystals can form into masses (tophi) near a joint, destroying bone and causing disfigurement. Gout is treatable and often preventable. CAUSES  The disease begins with elevated levels of uric acid in the blood. Uric acid is produced by your body when it breaks down a naturally found substance called purines. Certain foods you eat, such as meats and fish, contain high amounts of purines. Causes of an elevated uric acid level include:  Being passed down from parent to child (heredity).  Diseases that cause increased uric acid production (such as obesity, psoriasis, and certain  cancers).  Excessive alcohol use.  Diet, especially diets rich in meat and seafood.  Medicines, including certain cancer-fighting medicines (chemotherapy), water pills (diuretics), and aspirin.  Chronic kidney disease. The kidneys are no longer able to remove uric acid well.  Problems with metabolism. Conditions strongly associated with gout include:  Obesity.  High blood pressure.  High cholesterol.  Diabetes. Not everyone with elevated uric acid levels gets gout. It is not understood why some people get gout and others do not. Surgery, joint injury, and eating too much of certain foods are some of the factors that can lead to gout attacks. SYMPTOMS   An attack of gout comes on quickly. It causes intense pain with redness, swelling, and warmth in a joint.  Fever can occur.  Often, only one joint is involved. Certain joints are more commonly involved:  Base of the big toe.  Knee.  Ankle.  Wrist.  Finger. Without treatment, an attack usually goes away in a few days to weeks. Between attacks, you usually will not have symptoms, which is different from many other forms of arthritis. DIAGNOSIS  Your caregiver will suspect gout based on your symptoms and exam. In some cases, tests may be recommended. The tests may include:  Blood tests.  Urine tests.  X-rays.  Joint fluid exam. This exam requires a needle to remove fluid from the joint (arthrocentesis). Using a microscope, gout is confirmed when uric acid crystals are seen in the joint fluid. TREATMENT  There are  two phases to gout treatment: treating the sudden onset (acute) attack and preventing attacks (prophylaxis).  Treatment of an Acute Attack.  Medicines are used. These include anti-inflammatory medicines or steroid medicines.  An injection of steroid medicine into the affected joint is sometimes necessary.  The painful joint is rested. Movement can worsen the arthritis.  You may use warm or cold  treatments on painful joints, depending which works best for you.  Treatment to Prevent Attacks.  If you suffer from frequent gout attacks, your caregiver may advise preventive medicine. These medicines are started after the acute attack subsides. These medicines either help your kidneys eliminate uric acid from your body or decrease your uric acid production. You may need to stay on these medicines for a very long time.  The early phase of treatment with preventive medicine can be associated with an increase in acute gout attacks. For this reason, during the first few months of treatment, your caregiver may also advise you to take medicines usually used for acute gout treatment. Be sure you understand your caregiver's directions. Your caregiver may make several adjustments to your medicine dose before these medicines are effective.  Discuss dietary treatment with your caregiver or dietitian. Alcohol and drinks high in sugar and fructose and foods such as meat, poultry, and seafood can increase uric acid levels. Your caregiver or dietitian can advise you on drinks and foods that should be limited. HOME CARE INSTRUCTIONS   Do not take aspirin to relieve pain. This raises uric acid levels.  Only take over-the-counter or prescription medicines for pain, discomfort, or fever as directed by your caregiver.  Rest the joint as much as possible. When in bed, keep sheets and blankets off painful areas.  Keep the affected joint raised (elevated).  Apply warm or cold treatments to painful joints. Use of warm or cold treatments depends on which works best for you.  Use crutches if the painful joint is in your leg.  Drink enough fluids to keep your urine clear or pale yellow. This helps your body get rid of uric acid. Limit alcohol, sugary drinks, and fructose drinks.  Follow your dietary instructions. Pay careful attention to the amount of protein you eat. Your daily diet should emphasize fruits,  vegetables, whole grains, and fat-free or low-fat milk products. Discuss the use of coffee, vitamin C, and cherries with your caregiver or dietitian. These may be helpful in lowering uric acid levels.  Maintain a healthy body weight. SEEK MEDICAL CARE IF:   You develop diarrhea, vomiting, or any side effects from medicines.  You do not feel better in 24 hours, or you are getting worse. SEEK IMMEDIATE MEDICAL CARE IF:   Your joint becomes suddenly more tender, and you have chills or a fever. MAKE SURE YOU:   Understand these instructions.  Will watch your condition.  Will get help right away if you are not doing well or get worse. Document Released: 11/07/2000 Document Revised: 03/27/2014 Document Reviewed: 06/23/2012 [facility] Patient Information 2015 [place], [place]. This information is not intended to replace advice given to you by your health care provider. Make sure you discuss any questions you have with your health care provider.

## 2015-02-03 NOTE — Progress Notes (Signed)
Subjective:   Patient ID: Jeremiah Perkins male   DOB: 06/04/1961 54 y.o.   MRN: [id number]  HPI: [name] Jeremiah Perkins is a 54 y.o. man pmh as listed below presents for follow up of his presumed gout flare.   Pt is concerned that he may have RA and wants to be initiated on treatment. Overall he states while taking the colchicine his symptoms have improved, he has had better range of motion of the elbow, and the swelling has reduced. He stated that he wasn't taking any colchicine previously and had been on allopurinol before but is currently not taking any of those medications regularly. He denied any fever, chills, new rashes, or other joint pain beside his known OA of knees and back. He mentions that he also saw an orthopedist regarding this concern and had the elbow aspirated and was told it didn't represent gout (although pt had been on treatment as well.)     Past Medical History  Diagnosis Date  . Hypertension   . Congestive heart failure     No echo data available. Myocardial perfusion 2005 with EF 61%, presumed diastolic HF   . Diverticulosis     with history of diverticulitis in 10/2011  . Gout   . Major depression   . Anxiety   . Psychosexual dysfunction with inhibited sexual excitement   . Hyperlipidemia   . Atrial flutter   . Morbid obesity   . Chronic bronchitis     "get it q yr" (01/02/2015)  . Shortness of breath     "all the time" (07/26/2013)  . Chronic back pain   . GERD (gastroesophageal reflux disease)   . Asthma   . Myocardial infarction     "one doctor said I did back in 2014"  . Pneumonia 2013; 2014  . OSA (obstructive sleep apnea) 1998    "wear my CPAP a couple times/month" (01/02/2015)  . Diabetic peripheral neuropathy   . Type II diabetes mellitus dx'd 1991    previously on insulin in 2000 for 3-4 years (but was stopped because adamantly did not want to be on insulin)  . Arthritis     involving knees, ankles, back, elbows (01/02/2015)   Current Outpatient  Prescriptions  Medication Sig Dispense Refill  . amiodarone (PACERONE) 200 MG tablet TAKE 1 TABLET (200 MG TOTAL) BY MOUTH DAILY. 30 tablet 1  . aspirin-sod bicarb-citric acid (ALKA-SELTZER) 325 MG TBEF tablet Take 325 mg by mouth every 6 (six) hours as needed (heartburn).    Marland Kitchen atorvastatin (LIPITOR) 40 MG tablet Take 1 tablet (40 mg total) by mouth daily. 30 tablet 2  . Benzalkonium Chloride 50 % SOLN Clean scrotal area daily. 100 mL 0  . bismuth subsalicylate (PEPTO BISMOL) 262 MG/15ML suspension Take 30 mLs by mouth every 6 (six) hours as needed for indigestion.    . Blood Glucose Monitoring Suppl (ONE TOUCH ULTRA SYSTEM KIT) W/DEVICE KIT 1 kit by Does not apply route once. 1 each 3  . carvedilol (COREG) 3.125 MG tablet Take 1 tablet (3.125 mg total) by mouth 2 (two) times daily. 60 tablet 5  . colchicine 0.6 MG tablet Begin taking 1.72m (2 tablets) now, and 1 hour later take 0.669m(1 tablet), then 0.94m73maily 30 tablet 2  . diclofenac sodium (VOLTAREN) 1 % GEL Apply 2 g topically 4 (four) times daily.    . dMarland Kitchencusate sodium (COLACE) 100 MG capsule Take 100 mg by mouth daily as needed for mild constipation. For constipation    .  famotidine (PEPCID) 20 MG tablet Take 1 tablet (20 mg total) by mouth 2 (two) times daily. 60 tablet 0  . gabapentin (NEURONTIN) 600 MG tablet Take 1 tablet (600 mg total) by mouth 2 (two) times daily. Takes 637m in the am & 12029min the pm 60 tablet 5  . HYDROcodone-acetaminophen (NORCO/VICODIN) 5-325 MG per tablet Take 1-2 tablets by mouth every 6 (six) hours as needed for moderate pain. 60 tablet 0  . levalbuterol (XOPENEX HFA) 45 MCG/ACT inhaler Inhale 1-2 puffs into the lungs every 4 (four) hours as needed for wheezing. 1 Inhaler 12  . metFORMIN (GLUCOPHAGE) 1000 MG tablet Take 1 tablet (1,000 mg total) by mouth daily with breakfast. 60 tablet 11  . sildenafil (VIAGRA) 100 MG tablet Take 1 tablet (100 mg total) by mouth daily as needed for erectile dysfunction (pt  gets from [facility] 01/24/14). 10 tablet 3  . spironolactone (ALDACTONE) 25 MG tablet TAKE 1/2 TABLET (12.5 MG TOTAL) BY MOUTH DAILY. 15 tablet 2  . torsemide (DEMADEX) 20 MG tablet Take 2 tablets (40 mg total) by mouth 2 (two) times daily. 60 tablet 6  . XARELTO 20 MG TABS tablet TAKE 1 TABLET (20 MG TOTAL) BY MOUTH DAILY WITH SUPPER. 30 tablet 3   No current facility-administered medications for this visit.   Family History  Problem Relation Age of Onset  . Diabetes Mother   . CAD Mother 7017  requiring quadruple bypass  . Hypertension Mother   . Alzheimer's disease Mother   . Stomach cancer Father   . Lung cancer Father     was a smoker  . Gout Father   . CAD Brother 4844  requiring quadruple bypass  . Seizures Brother   . Hypertension Brother   . Diabetes Maternal Grandmother   . Alzheimer's disease Maternal Grandmother   . Breast cancer Paternal Aunt   . Diabetes Maternal Aunt   . Seizures Paternal Uncle    History   Social History  . Marital Status: Married    Spouse Name: N/A  . Number of Children: 3  . Years of Education: 14   Occupational History  . Now unemployed     AiConservation officer, [employer].  [employer]   Social History Main Topics  . Smoking status: Current Some Day Smoker -- 0.12 packs/day for 10 years    Types: Cigarettes  . Smokeless tobacco: Never Used     Comment: rarely.  . Alcohol Use: 0.0 oz/week    0 Standard drinks or equivalent per week     Comment: Rarely.  . Drug Use: No  . Sexual Activity: Not on file   Other Topics Concern  . 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 They each have children but none between the two of them.   Review of Systems: Pertinent items are noted in HPI. Objective:  Physical Exam: Filed Vitals:   02/02/15 1457  BP: 125/73  Pulse: 65  Temp: 98 F (36.7 C)  TempSrc: Oral  Height: _0  (1.702 m)  Weight: 271 lb (122.925 kg)  SpO2: 98%     General: sitting in chair, NAD Cardiac: RRR, no rubs, murmurs or gallops Pulm: clear to auscultation bilaterally, moving normal volumes of air Abd: soft, nontender, nondistended, BS present Ext: warm and well perfused, no pedal edema, minimal to reduced swelling of right elbow, FROM of elbow joints, 5/5  UE strength, normal sensation, 5/5 finger grip strength   Assessment & Plan:  Please see problem oriented charting  Pt discussed with Dr. Ellwood Dense

## 2015-02-03 NOTE — Assessment & Plan Note (Signed)
The patient's symptoms seem very likely to represent a gout flare. Pt uric acid was elevated at 11 when usually in the 8s. Pt has improved on gout treatment and has a significant personal history of gout. There are no records of the aspiration and from previous encounter records are being obtained. Pt is having symptomatic relief with cold packs and vicodin. Extensive discussion that this is most likely RA and that the patient doesn't need or warrant RA treatment at this time was had with the patient. -continue colchiene 0.6mg  daily -pt has been referred to rheumatology by his orthopedist -continue symptomatic management pt doesn't warrant steroids at this time and was discussed with patient

## 2015-02-03 NOTE — Assessment & Plan Note (Signed)
Will follow up once acute issues resolve.

## 2015-02-05 ENCOUNTER — Other Ambulatory Visit: Payer: Self-pay

## 2015-02-05 ENCOUNTER — Emergency Department (HOSPITAL_COMMUNITY)
Admission: EM | Admit: 2015-02-05 | Discharge: 2015-02-06 | Disposition: A | Discharge status: Home / Self Care | Payer: PPO | Attending: Emergency Medicine | Admitting: Emergency Medicine

## 2015-02-05 ENCOUNTER — Emergency Department (HOSPITAL_COMMUNITY): Payer: PPO

## 2015-02-05 ENCOUNTER — Encounter (HOSPITAL_COMMUNITY): Payer: Self-pay | Admitting: Emergency Medicine

## 2015-02-05 DIAGNOSIS — K219 Gastro-esophageal reflux disease without esophagitis: Secondary | ICD-10-CM | POA: Insufficient documentation

## 2015-02-05 DIAGNOSIS — I509 Heart failure, unspecified: Secondary | ICD-10-CM | POA: Insufficient documentation

## 2015-02-05 DIAGNOSIS — Z720 Tobacco use: Secondary | ICD-10-CM | POA: Diagnosis not present

## 2015-02-05 DIAGNOSIS — Z8659 Personal history of other mental and behavioral disorders: Secondary | ICD-10-CM | POA: Diagnosis not present

## 2015-02-05 DIAGNOSIS — Z791 Long term (current) use of non-steroidal anti-inflammatories (NSAID): Secondary | ICD-10-CM | POA: Diagnosis not present

## 2015-02-05 DIAGNOSIS — J45909 Unspecified asthma, uncomplicated: Secondary | ICD-10-CM | POA: Diagnosis not present

## 2015-02-05 DIAGNOSIS — M159 Polyosteoarthritis, unspecified: Secondary | ICD-10-CM | POA: Diagnosis not present

## 2015-02-05 DIAGNOSIS — E1342 Other specified diabetes mellitus with diabetic polyneuropathy: Secondary | ICD-10-CM | POA: Diagnosis not present

## 2015-02-05 DIAGNOSIS — G629 Polyneuropathy, unspecified: Secondary | ICD-10-CM | POA: Insufficient documentation

## 2015-02-05 DIAGNOSIS — Z9981 Dependence on supplemental oxygen: Secondary | ICD-10-CM | POA: Diagnosis not present

## 2015-02-05 DIAGNOSIS — I252 Old myocardial infarction: Secondary | ICD-10-CM | POA: Insufficient documentation

## 2015-02-05 DIAGNOSIS — E785 Hyperlipidemia, unspecified: Secondary | ICD-10-CM | POA: Insufficient documentation

## 2015-02-05 DIAGNOSIS — K8020 Calculus of gallbladder without cholecystitis without obstruction: Secondary | ICD-10-CM | POA: Diagnosis not present

## 2015-02-05 DIAGNOSIS — I10 Essential (primary) hypertension: Secondary | ICD-10-CM | POA: Diagnosis not present

## 2015-02-05 DIAGNOSIS — Z8701 Personal history of pneumonia (recurrent): Secondary | ICD-10-CM | POA: Diagnosis not present

## 2015-02-05 DIAGNOSIS — Z9889 Other specified postprocedural states: Secondary | ICD-10-CM | POA: Insufficient documentation

## 2015-02-05 DIAGNOSIS — Z7901 Long term (current) use of anticoagulants: Secondary | ICD-10-CM | POA: Insufficient documentation

## 2015-02-05 DIAGNOSIS — G4733 Obstructive sleep apnea (adult) (pediatric): Secondary | ICD-10-CM | POA: Diagnosis not present

## 2015-02-05 DIAGNOSIS — Z79899 Other long term (current) drug therapy: Secondary | ICD-10-CM | POA: Insufficient documentation

## 2015-02-05 DIAGNOSIS — R1010 Upper abdominal pain, unspecified: Secondary | ICD-10-CM | POA: Diagnosis not present

## 2015-02-05 DIAGNOSIS — R52 Pain, unspecified: Secondary | ICD-10-CM

## 2015-02-05 DIAGNOSIS — G8929 Other chronic pain: Secondary | ICD-10-CM | POA: Diagnosis not present

## 2015-02-05 DIAGNOSIS — R079 Chest pain, unspecified: Secondary | ICD-10-CM | POA: Diagnosis present

## 2015-02-05 DIAGNOSIS — M109 Gout, unspecified: Secondary | ICD-10-CM | POA: Diagnosis not present

## 2015-02-05 LAB — CBC WITH DIFFERENTIAL/PLATELET
BASOS ABS: 0 10*3/uL (ref 0.0–0.1)
Basophils Relative: 0 % (ref 0–1)
EOS ABS: 0.1 10*3/uL (ref 0.0–0.7)
Eosinophils Relative: 1 % (ref 0–5)
HCT: 46.3 % (ref 39.0–52.0)
HEMOGLOBIN: 15.7 g/dL (ref 13.0–17.0)
Lymphocytes Relative: 13 % (ref 12–46)
Lymphs Abs: 1.2 10*3/uL (ref 0.7–4.0)
MCH: 29.7 pg (ref 26.0–34.0)
MCHC: 33.9 g/dL (ref 30.0–36.0)
MCV: 87.5 fL (ref 78.0–100.0)
MONO ABS: 0.4 10*3/uL (ref 0.1–1.0)
MONOS PCT: 4 % (ref 3–12)
NEUTROS ABS: 7.6 10*3/uL (ref 1.7–7.7)
Neutrophils Relative %: 82 % — ABNORMAL HIGH (ref 43–77)
Platelets: 265 10*3/uL (ref 150–400)
RBC: 5.29 MIL/uL (ref 4.22–5.81)
RDW: 13.4 % (ref 11.5–15.5)
WBC: 9.2 10*3/uL (ref 4.0–10.5)

## 2015-02-05 LAB — BASIC METABOLIC PANEL
Anion gap: 13 (ref 5–15)
BUN: 32 mg/dL — AB (ref 6–23)
CALCIUM: 9.3 mg/dL (ref 8.4–10.5)
CHLORIDE: 99 mmol/L (ref 96–112)
CO2: 28 mmol/L (ref 19–32)
CREATININE: 1.31 mg/dL (ref 0.50–1.35)
GFR calc Af Amer: 69 mL/min — ABNORMAL LOW (ref >90)
GFR calc non Af Amer: 60 mL/min — ABNORMAL LOW (ref >90)
GLUCOSE: 223 mg/dL — AB (ref 70–99)
POTASSIUM: 4.1 mmol/L (ref 3.5–5.1)
Sodium: 140 mmol/L (ref 135–145)

## 2015-02-05 LAB — BRAIN NATRIURETIC PEPTIDE: B NATRIURETIC PEPTIDE 5: 118.6 pg/mL — AB (ref 0.0–100.0)

## 2015-02-05 LAB — I-STAT TROPONIN, ED: TROPONIN I, POC: 0.03 ng/mL (ref 0.00–0.08)

## 2015-02-05 LAB — LIPASE, BLOOD: Lipase: 58 U/L (ref 11–59)

## 2015-02-05 LAB — HEPATIC FUNCTION PANEL
ALT: 23 U/L (ref 0–53)
AST: 27 U/L (ref 0–37)
Albumin: 3.8 g/dL (ref 3.5–5.2)
Alkaline Phosphatase: 77 U/L (ref 39–117)
Total Bilirubin: 0.6 mg/dL (ref 0.3–1.2)
Total Protein: 7.9 g/dL (ref 6.0–8.3)

## 2015-02-05 MED ORDER — IOHEXOL 300 MG/ML  SOLN
25.0000 mL | Freq: Once | INTRAMUSCULAR | Status: AC | PRN
  Administered 2015-02-05: 25 mL via ORAL

## 2015-02-05 MED ORDER — ONDANSETRON HCL 4 MG/2ML IJ SOLN
4.0000 mg | Freq: Once | INTRAMUSCULAR | Status: AC
  Administered 2015-02-05: 4 mg via INTRAVENOUS
  Filled 2015-02-05: qty 2

## 2015-02-05 MED ORDER — SODIUM CHLORIDE 0.9 % IV BOLUS (SEPSIS)
1000.0000 mL | Freq: Once | INTRAVENOUS | Status: AC
  Administered 2015-02-05: 1000 mL via INTRAVENOUS

## 2015-02-05 MED ORDER — HYDROMORPHONE HCL 1 MG/ML IJ SOLN
1.0000 mg | INTRAMUSCULAR | Status: AC | PRN
  Administered 2015-02-05 (×3): 1 mg via INTRAVENOUS
  Filled 2015-02-05 (×3): qty 1

## 2015-02-05 NOTE — ED Notes (Signed)
Pt remains monitored by blood pressure, pulse ox, and 5 lead.  

## 2015-02-05 NOTE — ED Notes (Signed)
Pt sts right sided CP into side and abd area starting this am; pt denies swelling in legs

## 2015-02-05 NOTE — ED Notes (Signed)
Patient transported to CT 

## 2015-02-05 NOTE — ED Notes (Signed)
Pt asking for water.  Given per order  From dr Patria Mane.  He will be in shortly to talk with the pt.  Pt satisfied with the answer

## 2015-02-06 MED ORDER — OXYCODONE-ACETAMINOPHEN 5-325 MG PO TABS: 1.0000 | ORAL_TABLET | ORAL | Status: DC | PRN

## 2015-02-06 MED ORDER — ONDANSETRON 8 MG PO TBDP: 8.0000 mg | ORAL_TABLET | Freq: Three times a day (TID) | ORAL | Status: DC | PRN

## 2015-02-06 NOTE — ED Notes (Signed)
Pt a/o x 4 on d/c in wheelchair. 

## 2015-02-06 NOTE — ED Provider Notes (Signed)
CSN: [id number]     Arrival date & time 02/05/15  1524 History   First MD Initiated Contact with Patient 02/05/15 1845     Chief Complaint  Patient presents with  . Chest Pain      HPI Patient reports severe upper abdominal pain with some right sided stabbing abdominal pain which began this morning.  His associated nausea and vomiting.  He's never had symptoms like this before.  He denies cough or congestion.  Denies shortness of breath.  Denies diarrhea.  No fevers or chills.  Pain seems to be coming and going on the right side of his abdomen.  Denies right flank pain.  No testicular pain.  Pain is moderate to severe in severity   Past Medical History  Diagnosis Date  . Hypertension   . Congestive heart failure     No echo data available. Myocardial perfusion 2005 with EF 61%, presumed diastolic HF   . Diverticulosis     with history of diverticulitis in 10/2011  . Gout   . Major depression   . Anxiety   . Psychosexual dysfunction with inhibited sexual excitement   . Hyperlipidemia   . Atrial flutter   . Morbid obesity   . Chronic bronchitis     "get it q yr" (01/02/2015)  . Shortness of breath     "all the time" (07/26/2013)  . Chronic back pain   . GERD (gastroesophageal reflux disease)   . Asthma   . Myocardial infarction     "one doctor said I did back in 2014"  . Pneumonia 2013; 2014  . OSA (obstructive sleep apnea) 1998    "wear my CPAP a couple times/month" (01/02/2015)  . Diabetic peripheral neuropathy   . Type II diabetes mellitus dx'd 1991    previously on insulin in 2000 for 3-4 years (but was stopped because adamantly did not want to be on insulin)  . Arthritis     involving knees, ankles, back, elbows (01/02/2015)   Past Surgical History  Procedure Laterality Date  . Ankle reconstruction Right 1990's    2/2 trauma sustained after fall  . 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 Cyst removal leg Left 1960's    back of  leg  . Tee without cardioversion N/A  07/28/2013    Procedure: TRANSESOPHAGEAL ECHOCARDIOGRAM (TEE);  Surgeon: Laurey Morale, MD;  Location: [facility] ENDOSCOPY;  Service: Cardiovascular;  Laterality: N/A;  . Cardioversion N/A 07/28/2013    Procedure: CARDIOVERSION;  Surgeon: Laurey Morale, MD;  Location: [facility] ENDOSCOPY;  Service: Cardiovascular;  Laterality: N/A;  . Cardioversion N/A 10/26/2013    Procedure: CARDIOVERSION;  Surgeon: Cassell Clement, MD;  Location: [facility] ENDOSCOPY;  Service: Cardiovascular;  Laterality: N/A;  . Left heart catheterization with coronary angiogram N/A 07/27/2013    Procedure: LEFT HEART CATHETERIZATION WITH CORONARY ANGIOGRAM;  Surgeon: Peter M Swaziland, MD;  Location: [facility] CATH LAB;  Service: Cardiovascular;  Laterality: N/A;  . Cardiac catheterization  1990's; 07/2013   Family History  Problem Relation Age of Onset  . Diabetes Mother   . CAD Mother 18    requiring quadruple bypass  . Hypertension Mother   . Alzheimer's disease Mother   . Stomach cancer Father   . Lung cancer Father     was a smoker  . Gout Father   . CAD Brother 48    requiring quadruple bypass  . Seizures Brother   . Hypertension Brother   . Diabetes Maternal  Grandmother   . Alzheimer's disease Maternal Grandmother   . Breast cancer Paternal Aunt   . Diabetes Maternal Aunt   . Seizures Paternal Uncle    History  Substance Use Topics  . Smoking status: Current Some Day Smoker -- 0.12 packs/day for 10 years    Types: Cigarettes  . Smokeless tobacco: Never Used     Comment: rarely.  . Alcohol Use: 0.0 oz/week    0 Standard drinks or equivalent per week     Comment: Rarely.    Review of Systems  All other systems reviewed and are negative.     Allergies  Eggs or egg-derived products and Sulfa antibiotics  Home Medications   Prior to Admission medications   Medication Sig Start Date End Date Taking? Authorizing Provider  amiodarone (PACERONE) 200 MG tablet TAKE 1 TABLET (200 MG TOTAL) BY MOUTH DAILY. 01/29/15  Yes Cassell Clement, MD  atorvastatin (LIPITOR) 40 MG tablet Take 1 tablet (40 mg total) by mouth daily. 01/05/15  Yes Su Hoff, MD  Benzalkonium Chloride 50 % SOLN Clean scrotal area daily. 12/26/14  Yes Carolan Clines, MD  bismuth subsalicylate (PEPTO BISMOL) 262 MG/15ML suspension Take 30 mLs by mouth every 6 (six) hours as needed for indigestion.   Yes Historical Provider, MD  carvedilol (COREG) 3.125 MG tablet Take 1 tablet (3.125 mg total) by mouth 2 (two) times daily. 01/12/15  Yes Cassell Clement, MD  colchicine 0.6 MG tablet Begin taking 1.2mg  (2 tablets) now, and 1 hour later take 0.6mg  (1 tablet), then 0.6mg  daily 01/26/15 05/04/15 Yes Genelle Gather, MD  diclofenac sodium (VOLTAREN) 1 % GEL Apply 2 g topically 4 (four) times daily.   Yes Historical Provider, MD  docusate sodium (COLACE) 100 MG capsule Take 100 mg by mouth daily as needed for mild constipation. For constipation   Yes Historical Provider, MD  famotidine (PEPCID) 20 MG tablet Take 1 tablet (20 mg total) by mouth 2 (two) times daily. 01/18/15  Yes Carolan Clines, MD  gabapentin (NEURONTIN) 600 MG tablet Take 1 tablet (600 mg total) by mouth 2 (two) times daily. Takes  in the am &  in the pm 08/31/14  Yes Carolan Clines, MD  HYDROcodone-acetaminophen (NORCO/VICODIN) 5-325 MG per tablet Take 1-2 tablets by mouth every 6 (six) hours as needed for moderate pain. 01/26/15  Yes Genelle Gather, MD  levalbuterol [facility] HFA) 45 MCG/ACT inhaler Inhale 1-2 puffs into the lungs every 4 (four) hours as needed for wheezing. 07/31/13  Yes Roger A Arguello, PA-C  metFORMIN (GLUCOPHAGE) 1000 MG tablet Take 1 tablet (1,000 mg total) by mouth daily with breakfast. 07/28/14 07/28/15 Yes Carolan Clines, MD  sildenafil (VIAGRA) 100 MG tablet Take 1 tablet (100 mg total) by mouth daily as needed for erectile dysfunction (pt gets from [facility] note 01/24/14). 05/05/14  Yes Carolan Clines, MD  spironolactone (ALDACTONE) 25 MG tablet TAKE 1/2 TABLET (12.5 MG TOTAL) BY MOUTH  DAILY. 11/01/14  Yes Dolores Patty, MD  torsemide (DEMADEX) 20 MG tablet Take 2 tablets (40 mg total) by mouth 2 (two) times daily. 01/29/15  Yes Cassell Clement, MD  XARELTO 20 MG TABS tablet TAKE 1 TABLET (20 MG TOTAL) BY MOUTH DAILY WITH SUPPER. 09/29/14  Yes Marinus Maw, MD  aspirin-sod bicarb-citric acid (ALKA-SELTZER) 325 MG TBEF tablet Take 325 mg by mouth every 6 (six) hours as needed (heartburn).    Historical Provider, MD  ondansetron (ZOFRAN ODT) 8 MG  disintegrating tablet Take 1 tablet (8 mg total) by mouth every 8 (eight) hours as needed for nausea or vomiting. 02/06/15   Azalia Bilis, MD  oxyCODONE-acetaminophen (PERCOCET/ROXICET) 5-325 MG per tablet Take 1 tablet by mouth every 4 (four) hours as needed for severe pain. 02/06/15   Azalia Bilis, MD   BP 117/56 mmHg  Pulse 78  Temp(Src) 98 F (36.7 C) (Oral)  Resp 17  Ht 5\' 7"  (1.702 m)  SpO2 98% Physical Exam  Constitutional: He is oriented to person, place, and time. He appears well-developed and well-nourished.  HENT:  Head: Normocephalic and atraumatic.  Eyes: EOM are normal.  Neck: Normal range of motion.  Cardiovascular: Normal rate, regular rhythm, normal heart sounds and intact distal pulses.   Pulmonary/Chest: Effort normal and breath sounds normal. No respiratory distress.  Abdominal: Soft. He exhibits no distension.  Mild epigastric tenderness  Musculoskeletal: Normal range of motion.  Neurological: He is alert and oriented to person, place, and time.  Skin: Skin is warm and dry.  Psychiatric: He has a normal mood and affect. Judgment normal.  Nursing note and vitals reviewed.   ED Course  Procedures (including critical care time) Labs Review Labs Reviewed  BASIC METABOLIC PANEL - Abnormal; Notable for the following:    Glucose, Bld 223 (*)    BUN 32 (*)    GFR calc non Af Amer 60 (*)    GFR calc Af Amer 69 (*)    All other components within normal limits  BRAIN NATRIURETIC PEPTIDE - Abnormal;  Notable for the following:    B Natriuretic Peptide 118.6 (*)    All other components within normal limits  CBC WITH DIFFERENTIAL/PLATELET - Abnormal; Notable for the following:    Neutrophils Relative % 82 (*)    All other components within normal limits  LIPASE, BLOOD  HEPATIC FUNCTION PANEL  I-STAT TROPOININ, ED    Imaging Review Ct Abdomen Pelvis Wo Contrast  02/05/2015   CLINICAL DATA:  Upper abdominal pain, nausea/vomiting  EXAM: CT ABDOMEN AND PELVIS WITHOUT CONTRAST  TECHNIQUE: Multidetector CT imaging of the abdomen and pelvis was performed following the standard protocol without IV contrast.  COMPARISON:  CT abdomen pelvis dated 07/09/2004. CT chest dated 01/03/2015.  FINDINGS: Lower chest: Mild nodularity along the right major fissure, unchanged since 2012, benign.  Hepatobiliary: Unenhanced liver is unremarkable.  Cholelithiasis. Mild pericholecystic stranding/inflammatory changes (coronal image 61). No intrahepatic or extrahepatic ductal dilatation.  Pancreas: Within normal limits.  Spleen: Within normal limits.  Adrenals/Urinary Tract: Adrenal glands are unremarkable.  Kidneys are within normal limits.  No ureteral or bladder calculi.  Bladder is within normal limits.  Stomach/Bowel: Stomach is within normal limits.  No evidence of bowel obstruction.  Normal appendix.  Vascular/Lymphatic: No evidence of abdominal aortic aneurysm.  Small upper abdominal lymph nodes which do not meet pathologic CT size criteria.  Reproductive: Prostate is unremarkable.  Other: No abdominopelvic ascites.  Musculoskeletal: Degenerative changes of the visualized thoracolumbar spine.  IMPRESSION: Cholelithiasis with mild pericholecystic stranding/inflammatory changes, raising the possibility of early acute cholecystitis.  Consider right upper quadrant ultrasound or hepatobiliary nuclear medicine scan for further evaluation as clinically warranted.  Otherwise, no CT findings to account for the patient's abdominal  pain. Normal appendix.   Electronically Signed   By: Charline Bills M.D.   On: 02/05/2015 21:20   Dg Chest 2 View  02/05/2015   CLINICAL DATA:  Lambert Mody diffuse chest pain initial evaluation, right-sided abdominal pain constipation and vomiting  today  EXAM: CHEST  2 VIEW  COMPARISON:  01/02/2015  FINDINGS: Stable mild to moderate cardiac enlargement with mild vascular congestion. Mild diffuse stable interstitial prominence, which has been described as stable since 2014. No consolidation or effusion.  IMPRESSION: No change from prior study. Cardiac enlargement with interstitial prominence that is stable suggesting interstitial lung disease.   Electronically Signed   By: Esperanza Heir M.D.   On: 02/05/2015 16:23   US Abdomen Limited  02/05/2015   CLINICAL DATA:  Abdominal pain for 1 day subsequent evaluation  EXAM: US ABDOMEN LIMITED - RIGHT UPPER QUADRANT  COMPARISON:  CT scan performed 02/05/2015  FINDINGS: Gallbladder:  Multiple gallstones the largest measuring 18 mm. Gallbladder wall thickness is normal at 2 mm. No sonographic Murphy sign.  Common bile duct:  Diameter: Approximately 5.5 mm birth seen and only limited detail.  Liver:  Not well characterized due to patient body habitus.  IMPRESSION: Study limited by patient body habitus. Numerous gallstones the largest measuring 18 mm. Gallbladder wall does not appear to be thickened.   Electronically Signed   By: Esperanza Heir M.D.   On: 02/05/2015 22:34  I personally reviewed the imaging tests through PACS system I reviewed available ER/hospitalization records through the EMR    EKG Interpretation None      MDM   Final diagnoses:  Calculus of gallbladder without cholecystitis without obstruction    12:09 AM Patient feels much better at this time.  Repeat abdominal exam is benign.  This sounds like biliary colic without cholecystitis.  Ultrasound shows normal gallbladder wall without thickening.  No pericholecystic fluid.  Findings on CT  were noted.  White count is normal.  Patient understands return the ER for new or worsening symptoms.  Best managed as an outpatient.  Home with pain medicine and nausea medicine and instructions to call the general surgery office in the morning for close follow-up.    Azalia Bilis, MD 02/06/15 [phone]

## 2015-02-06 NOTE — Discharge Instructions (Signed)
Biliary Colic  °Biliary colic is a steady or irregular pain in the upper abdomen. It is usually under the right side of the rib cage. It happens when gallstones interfere with the normal flow of bile from the gallbladder. Bile is a liquid that helps to digest fats. Bile is made in the liver and stored in the gallbladder. When you eat a meal, bile passes from the gallbladder through the cystic duct and the common bile duct into the small intestine. There, it mixes with partially digested food. If a gallstone blocks either of these ducts, the normal flow of bile is blocked. The muscle cells in the bile duct contract forcefully to try to move the stone. This causes the pain of biliary colic.  °SYMPTOMS  °· A person with biliary colic usually complains of pain in the upper abdomen. This pain can be: °¨ In the center of the upper abdomen just below the breastbone. °¨ In the upper-right part of the abdomen, near the gallbladder and liver. °¨ Spread back toward the right shoulder blade. °· Nausea and vomiting. °· The pain usually occurs after eating. °· Biliary colic is usually triggered by the digestive system's demand for bile. The demand for bile is high after fatty meals. Symptoms can also occur when a person who has been fasting suddenly eats a very large meal. Most episodes of biliary colic pass after 1 to 5 hours. After the most intense pain passes, your abdomen may continue to ache mildly for about 24 hours. °DIAGNOSIS  °After you describe your symptoms, your caregiver will perform a physical exam. He or she will pay attention to the upper right portion of your belly (abdomen). This is the area of your liver and gallbladder. An ultrasound will help your caregiver look for gallstones. Specialized scans of the gallbladder may also be done. Blood tests may be done, especially if you have fever or if your pain persists. °PREVENTION  °Biliary colic can be prevented by controlling the risk factors for gallstones. Some of  these risk factors, such as heredity, increasing age, and pregnancy are a normal part of life. Obesity and a high-fat diet are risk factors you can change through a healthy lifestyle. Women going through menopause who take hormone replacement therapy (estrogen) are also more likely to develop biliary colic. °TREATMENT  °· Pain medication may be prescribed. °· You may be encouraged to eat a fat-free diet. °· If the first episode of biliary colic is severe, or episodes of colic keep retuning, surgery to remove the gallbladder (cholecystectomy) is usually recommended. This procedure can be done through small incisions using an instrument called a laparoscope. The procedure often requires a brief stay in the hospital. Some people can leave the hospital the same day. It is the most widely used treatment in people troubled by painful gallstones. It is effective and safe, with no complications in more than 90% of cases. °· If surgery cannot be done, medication that dissolves gallstones may be used. This medication is expensive and can take months or years to work. Only small stones will dissolve. °· Rarely, medication to dissolve gallstones is combined with a procedure called shock-wave lithotripsy. This procedure uses carefully aimed shock waves to break up gallstones. In many people treated with this procedure, gallstones form again within a few years. °PROGNOSIS  °If gallstones block your cystic duct or common bile duct, you are at risk for repeated episodes of biliary colic. There is also a 25% chance that you will develop   a gallbladder infection(acute cholecystitis), or some other complication of gallstones within 10 to 20 years. If you have surgery, schedule it at a time that is convenient for you and at a time when you are not sick. °HOME CARE INSTRUCTIONS  °· Drink plenty of clear fluids. °· Avoid fatty, greasy or fried foods, or any foods that make your pain worse. °· Take medications as directed. °SEEK MEDICAL  CARE IF:  °· You develop a fever over 100.5° F (38.1° C). °· Your pain gets worse over time. °· You develop nausea that prevents you from eating and drinking. °· You develop vomiting. °SEEK IMMEDIATE MEDICAL CARE IF:  °· You have continuous or severe belly (abdominal) pain which is not relieved with medications. °· You develop nausea and vomiting which is not relieved with medications. °· You have symptoms of biliary colic and you suddenly develop a fever and shaking chills. This may signal cholecystitis. Call your caregiver immediately. °· You develop a yellow color to your skin or the white part of your eyes (jaundice). °Document Released: 04/13/2006 Document Revised: 02/02/2012 Document Reviewed: 06/22/2008 °ExitCare® Patient Information ©2015 [employer]. This information is not intended to replace advice given to you by your health care provider. Make sure you discuss any questions you have with your health care provider. ° °

## 2015-02-13 ENCOUNTER — Other Ambulatory Visit: Payer: PPO

## 2015-02-15 ENCOUNTER — Encounter: Payer: Self-pay | Admitting: *Deleted

## 2015-02-15 ENCOUNTER — Other Ambulatory Visit: Payer: Self-pay | Admitting: *Deleted

## 2015-02-15 DIAGNOSIS — Z7901 Long term (current) use of anticoagulants: Secondary | ICD-10-CM

## 2015-02-15 NOTE — Patient Instructions (Signed)
CSW will perform a case closure on patient, as patient no longer meets eligibility criteria to be enrolled in [facility] [employer] [facility] [facility] services.  CSW will mail a case closure letter to patients home.  CSW will also mail a case closure letter to pt.'s Primary Care Physician, Dr. Burtis Junes.  CSW will converse with patients RNCM with [facility], Emilia Beck to ensure that Jeremiah Perkins is aware of CSW's plans to close patients case.  Danford Bad, BSW, MSW, LCSW [email or web address] [phone]

## 2015-02-16 ENCOUNTER — Other Ambulatory Visit: Payer: Self-pay | Admitting: Internal Medicine

## 2015-02-20 ENCOUNTER — Other Ambulatory Visit: Payer: Self-pay

## 2015-02-20 NOTE — Patient Outreach (Signed)
Unsuccessful outreach. Will make another attempt to contact patient again on February 23, 2015

## 2015-02-23 ENCOUNTER — Other Ambulatory Visit: Payer: Self-pay

## 2015-02-23 NOTE — Patient Outreach (Signed)
Briefly spoke to patient related to his management of chronic illnesses. Patient states he has been better compliant with his diet.  States he had an episode where his gallbladder gave him trouble but is feeling better now.    This RNCM and patient agreed on home visit on Thursday April 7.

## 2015-02-23 NOTE — Patient Instructions (Signed)
Avoid foods high in sodium Avoid processed foods such as processed snacks Weigh daily and record

## 2015-02-27 ENCOUNTER — Encounter: Payer: Self-pay | Admitting: General Surgery

## 2015-02-27 NOTE — Progress Notes (Addendum)
Patient ID: Jeremiah Perkins, male   DOB: [birth year], 54 y.o.   MRN: [id number]  History of Present Illness Jeremiah Pollack MD; 02/27/2015 3:00 PM) The patient is a 54 year old male   Note:He is referred by Dr. Patria Mane because of upper abdominal pain and cholelithiasis. On March 15, he went to the emergency room because of day long progressive pressure type pain in the epigastrium and right upper quadrant region. Belching did not help the pain. He was evaluated there. Liver function tests were not elevated. Lipase was normal. Cardiac enzymes were normal. Multiple gallstones were noted on ultrasound and CT scan. White blood cell count was not elevated. He became comfortable while in the emergency department and was discharged from there with recommendations to follow up in our office. He has had less intense episodes in the past which were more self-limited. He is started on a low fat diet and has not had any episodes in the past 3 weeks.   Other Problems Jeremiah Perkins, CMA; 02/27/2015 3:02 PM) Arthritis Atrial Fibrillation Back Pain Bladder Problems Cholelithiasis Diabetes Mellitus  Past Surgical History Jeremiah Perkins, CMA; 02/27/2015 3:02 PM) Oral Surgery  Social History Jeremiah Perkins, CMA; 02/27/2015 3:02 PM) Alcohol use Occasional alcohol use. Caffeine use Coffee. No drug use Tobacco use Current some day smoker.  Family History Jeremiah Perkins, [place]; 02/27/2015 3:02 PM) Respiratory Condition Father. Seizure disorder Brother.  Review of Systems Jeremiah Perkins Jeremiah Perkins CMA; 02/27/2015 3:02 PM) General Not Present- Appetite Loss, Chills, Fatigue, Fever, Night Sweats, Weight Gain and Weight Loss. HEENT Not Present- Earache, Hearing Loss, Hoarseness, Nose Bleed, Oral Ulcers, Ringing in the Ears, Seasonal Allergies, Sinus Pain, Sore Throat, Visual Disturbances, Wears glasses/contact lenses and Yellow Eyes. Musculoskeletal Not Present- Back Pain, Joint Pain, Joint Stiffness,  Muscle Pain, Muscle Weakness and Swelling of Extremities. Neurological Not Present- Decreased Memory, Fainting, Headaches, Numbness, Seizures, Tingling, Tremor, Trouble walking and Weakness.   Physical Exam Jeremiah Pollack MD; 02/27/2015 3:00 PM) The physical exam findings are as follows: Note:General: Morbidly obese male in NAD. Pleasant and cooperative.  HEENT: /AT, no facial masses  EYES: EOMI, no icterus  CV: RRR.  CHEST: Breath sounds equal and clear. Respirations nonlabored.  ABDOMEN: Soft, nontender, nondistended, no masses, no organomegaly, active bowel sounds, no scars, no hernias.  NEUROLOGIC: Alert and oriented, answers questions appropriately, normal gait and station.  PSYCHIATRIC: Normal mood, affect , and behavior.    Assessment & Plan Jeremiah Pollack MD; 02/27/2015 3:05 PM) SYMPTOMATIC CHOLELITHIASIS (574.20  K80.20) Impression: We discussed alternatives for treatment. One alternative would be to stay on a low-fat diet and hope he does not have any further episodes. The other alternative would be to proceed with scheduling laparoscopic cholecystectomy with cholangiogram. I have explained the procedure, risks, and aftercare of cholecystectomy. Risks include but are not limited to bleeding, infection, wound problems, anesthesia, diarrhea, bile leak, injury to common bile duct/liver/intestine.  Plan: He is not sure he wants to pursue surgery at this time. We discussed the possible complications of cholelithiasis. I asked him to call me back when he decides whether or whether not he wants to proceed with surgery. Current Plans  Follow up as needed Free Text Instructions : discussed with patient and provided information.   Avel Peace, MD

## 2015-03-01 ENCOUNTER — Other Ambulatory Visit: Payer: Self-pay

## 2015-03-01 NOTE — Patient Instructions (Signed)
EMMI videos on Gallbladder surgery #[id number]

## 2015-03-06 ENCOUNTER — Other Ambulatory Visit: Payer: Self-pay | Admitting: General Surgery

## 2015-03-06 NOTE — Progress Notes (Signed)
Dr. Patty Sermons feels he need no further preop cardiac evaluation.  He hold his Xarelto perioperatively.

## 2015-03-08 ENCOUNTER — Telehealth: Payer: Self-pay | Admitting: Cardiology

## 2015-03-08 NOTE — Telephone Encounter (Signed)
Request for surgical clearance:  1. What type of surgery is being performed? Remove Gal bladder   2. When is this surgery scheduled? Not scheduled until clearance is received   3. Are there any medications that need to be held prior to surgery and how long? Requests to hold blodd thinners for 5-7 days   4. Name of physician performing surgery? Dr. Abbey Chatters   5. What is your office phone and fax number? [phone] Fax# [phone] Comments: Dr. Arne Cleveland notes are in epic

## 2015-03-08 NOTE — Telephone Encounter (Signed)
I believe he is due for a follow-up office visit in the near future? We should see him in the office and then talk about surgical risk.  Get EKG at next office visit

## 2015-03-09 ENCOUNTER — Ambulatory Visit: Payer: PPO | Admitting: Dietician

## 2015-03-09 NOTE — Telephone Encounter (Signed)
Scheduled appointment for patient next week, patient aware

## 2015-03-13 ENCOUNTER — Telehealth: Payer: Self-pay | Admitting: *Deleted

## 2015-03-13 ENCOUNTER — Ambulatory Visit: Payer: PPO | Admitting: Cardiology

## 2015-03-13 NOTE — Telephone Encounter (Signed)
Reorder number [id number], pt ID # [id number]  Order number [id number]

## 2015-03-22 ENCOUNTER — Telehealth: Payer: Self-pay

## 2015-03-22 NOTE — Telephone Encounter (Signed)
Call patient to let him know that his viagra was here at the office

## 2015-03-27 ENCOUNTER — Ambulatory Visit: Payer: PPO | Admitting: Cardiology

## 2015-03-27 ENCOUNTER — Other Ambulatory Visit: Payer: PPO

## 2015-04-02 ENCOUNTER — Other Ambulatory Visit (HOSPITAL_COMMUNITY): Payer: Self-pay | Admitting: Cardiology

## 2015-04-04 ENCOUNTER — Encounter: Payer: Self-pay | Admitting: *Deleted

## 2015-04-06 ENCOUNTER — Other Ambulatory Visit: Payer: PPO

## 2015-04-06 ENCOUNTER — Ambulatory Visit: Payer: PPO | Admitting: Cardiology

## 2015-04-27 ENCOUNTER — Telehealth: Payer: Self-pay | Admitting: *Deleted

## 2015-04-27 ENCOUNTER — Other Ambulatory Visit (INDEPENDENT_AMBULATORY_CARE_PROVIDER_SITE_OTHER): Payer: PPO | Admitting: *Deleted

## 2015-04-27 ENCOUNTER — Encounter: Payer: Self-pay | Admitting: Cardiology

## 2015-04-27 ENCOUNTER — Ambulatory Visit (INDEPENDENT_AMBULATORY_CARE_PROVIDER_SITE_OTHER): Payer: PPO | Admitting: Cardiology

## 2015-04-27 VITALS — BP 124/72 | HR 91 | Ht 67.0 in | Wt 273.8 lb

## 2015-04-27 DIAGNOSIS — I480 Paroxysmal atrial fibrillation: Secondary | ICD-10-CM | POA: Diagnosis not present

## 2015-04-27 DIAGNOSIS — I429 Cardiomyopathy, unspecified: Secondary | ICD-10-CM

## 2015-04-27 DIAGNOSIS — I10 Essential (primary) hypertension: Secondary | ICD-10-CM | POA: Diagnosis not present

## 2015-04-27 DIAGNOSIS — I428 Other cardiomyopathies: Secondary | ICD-10-CM

## 2015-04-27 DIAGNOSIS — K8000 Calculus of gallbladder with acute cholecystitis without obstruction: Secondary | ICD-10-CM

## 2015-04-27 DIAGNOSIS — K8010 Calculus of gallbladder with chronic cholecystitis without obstruction: Secondary | ICD-10-CM

## 2015-04-27 DIAGNOSIS — I5022 Chronic systolic (congestive) heart failure: Secondary | ICD-10-CM

## 2015-04-27 LAB — BASIC METABOLIC PANEL
BUN: 32 mg/dL — ABNORMAL HIGH (ref 6–23)
CHLORIDE: 98 meq/L (ref 96–112)
CO2: 27 mEq/L (ref 19–32)
CREATININE: 1.64 mg/dL — AB (ref 0.40–1.50)
Calcium: 9.1 mg/dL (ref 8.4–10.5)
GFR: 56.21 mL/min — ABNORMAL LOW (ref >60.00)
Glucose, Bld: 209 mg/dL — ABNORMAL HIGH (ref 70–99)
Potassium: 3.4 mEq/L — ABNORMAL LOW (ref 3.5–5.1)
Sodium: 135 mEq/L (ref 135–145)

## 2015-04-27 MED ORDER — LOSARTAN POTASSIUM 25 MG PO TABS: 25.0000 mg | ORAL_TABLET | Freq: Every day | ORAL | Status: DC

## 2015-04-27 NOTE — Patient Instructions (Signed)
Medication Instructions:  START LOSARTAN 25 MG ONE TABLET DAILY  Labwork: BMET IN 1 WEEK  Testing/Procedures: NONE  Follow-Up: Your physician recommends that you schedule a follow-up appointment in: 2 MONTH OV/BMET/EKG

## 2015-04-27 NOTE — Telephone Encounter (Signed)
-----   Message from Cassell Clement, MD sent at 04/27/2015  4:46 PM EDT ----- His creatinine has jumped up to 1.64.  So hold off on starting losartan for now. His creatinine clearance is above 50 so he is still on the correct dose of xarelto. Needs to try to drink more water. Return for BMET in a week as already scheduled.

## 2015-04-27 NOTE — Telephone Encounter (Signed)
Advised wife, verbalized understanding.  

## 2015-04-27 NOTE — Progress Notes (Signed)
Cardiology Office Note   Date:  04/27/2015   ID:  Jeremiah Perkins, DOB Jan 06, 1961, MRN [id number]  PCP:  Jeremiah Bame, MD  Cardiologist: Jeremiah Clement MD  Chief Complaint  Patient presents with  . Follow-up    2 month      History of Present Illness: Jeremiah Perkins is a 54 y.o. male who presents for a four-month follow-up office visit.  This pleasant 54 year old [place] American gentleman is seen for a  office visit. The patient underwent elective outpatient cardioversion on 10/26/13. He hadremained in normal sinus rhythm.  His most recent EKG of 02/05/15 showed normal sinus rhythm.  Today he is back in atrial flutter fibrillation.  He was not aware that his heart rhythm had changed. Jeremiah Perkins He has a history of a nonischemic cardiomyopathy with previous catheterization showing no significant obstructive disease. His ejection fraction is estimated at 20-25%. He is diuresing on his current outpatient regimen. He still notes significant dyspnea with minimal exertion. Jeremiah Perkins He does have occasional chest discomfort. The patient also has a history of diabetes mellitus type 2 which is followed at the internal medicine clinic. He has a history of obstructive sleep apnea and morbid obesity and diabetic peripheral neuropathy. He has a past history of hypertension and gout. He has a past history of peripheral neuropathy and is on gabapentin The patient has finished the the cardiac rehabilitation program. He elected not to go into the maintenance phase because it would have been expensive for him. The patient reiterated today that he is not interested in an ICD or a pacemaker. He has been getting some exercise.  He has been playing some golf.  Yesterday he cut his grass and did a lot of physical yardwork.  He denies exertional chest pain dizziness or syncope.  He does have exertional dyspnea and has to stop and rest periodically. He has known cholelithiasis.  Surgery was discussed.  Dr. Suzzanne Perkins is his surgeon.  Previously scheduled surgery was canceled because the patient's wife got sick and he had to be her caregiver.  He has not been having any recent symptoms from his gallbladder and he is not sure whether he will proceed with surgery or not.  Past Medical History  Diagnosis Date  . Hypertension   . Congestive heart failure     No echo data available. Myocardial perfusion 2005 with EF 61%, presumed diastolic HF   . Diverticulosis     with history of diverticulitis in 10/2011  . Gout   . Major depression   . Anxiety   . Psychosexual dysfunction with inhibited sexual excitement   . Hyperlipidemia   . Atrial flutter   . Morbid obesity   . Chronic bronchitis     "get it q yr" (01/02/2015)  . Shortness of breath     "all the time" (07/26/2013)  . Chronic back pain   . GERD (gastroesophageal reflux disease)   . Asthma   . Myocardial infarction     "one doctor said I did back in 2014"  . Pneumonia 2013; 2014  . OSA (obstructive sleep apnea) 1998    "wear my CPAP a couple times/month" (01/02/2015)  . Diabetic peripheral neuropathy   . Type II diabetes mellitus dx'd 1991    previously on insulin in 2000 for 3-4 years (but was stopped because adamantly did not want to be on insulin)  . Arthritis     involving knees, ankles, back, elbows (01/02/2015)    Past  Surgical History  Procedure Laterality Date  . Ankle reconstruction Right 1990's    2/2 trauma sustained after fall  . 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 Cyst removal leg Left 1960's    back of  leg  . Tee without cardioversion N/A 07/28/2013    Procedure: TRANSESOPHAGEAL ECHOCARDIOGRAM (TEE);  Surgeon: Jeremiah Morale, MD;  Location: [facility] ENDOSCOPY;  Service: Cardiovascular;  Laterality: N/A;  . Cardioversion N/A 07/28/2013    Procedure: CARDIOVERSION;  Surgeon: Jeremiah Morale, MD;  Location: [facility] ENDOSCOPY;  Service: Cardiovascular;  Laterality: N/A;  . Cardioversion N/A 10/26/2013    Procedure: CARDIOVERSION;   Surgeon: Jeremiah Clement, MD;  Location: [facility] ENDOSCOPY;  Service: Cardiovascular;  Laterality: N/A;  . Left heart catheterization with coronary angiogram N/A 07/27/2013    Procedure: LEFT HEART CATHETERIZATION WITH CORONARY ANGIOGRAM;  Surgeon: Jeremiah M [place], MD;  Location: [facility] CATH LAB;  Service: Cardiovascular;  Laterality: N/A;  . Cardiac catheterization  1990's; 07/2013     Current Outpatient Prescriptions  Medication Sig Dispense Refill  . amiodarone (PACERONE) 200 MG tablet TAKE 1 TABLET (200 MG TOTAL) BY MOUTH DAILY. 30 tablet 0  . aspirin-sod bicarb-citric acid (ALKA-SELTZER) 325 MG TBEF tablet Take 325 mg by mouth every 6 (six) hours as needed (heartburn).    Jeremiah Perkins atorvastatin (LIPITOR) 40 MG tablet Take 1 tablet (40 mg total) by mouth daily. 30 tablet 2  . Benzalkonium Chloride 50 % SOLN Clean scrotal area daily. 100 mL 0  . bismuth subsalicylate (PEPTO BISMOL) 262 MG/15ML suspension Take 30 mLs by mouth every 6 (six) hours as needed for indigestion.    . carvedilol (COREG) 3.125 MG tablet Take 1 tablet (3.125 mg total) by mouth 2 (two) times daily. 60 tablet 5  . colchicine 0.6 MG tablet Begin taking 1.2mg  (2 tablets) now, and 1 hour later take 0.6mg  (1 tablet), then 0.6mg  daily 30 tablet 2  . diclofenac sodium (VOLTAREN) 1 % GEL Apply 2 g topically 4 (four) times daily.    Jeremiah Perkins docusate sodium (COLACE) 100 MG capsule Take 100 mg by mouth daily as needed for mild constipation. For constipation    . famotidine (PEPCID) 20 MG tablet Take 1 tablet (20 mg total) by mouth 2 (two) times daily. 60 tablet 0  . gabapentin (NEURONTIN) 600 MG tablet Take 1 tablet (600 mg total) by mouth 2 (two) times daily. Takes 600mg  in the am & 1200mg  in the pm 60 tablet 5  . HYDROcodone-acetaminophen (NORCO/VICODIN) 5-325 MG per tablet Take 1-2 tablets by mouth every 6 (six) hours as needed for moderate pain. 60 tablet 0  . levalbuterol (XOPENEX HFA) 45 MCG/ACT inhaler Inhale 1-2 puffs into the lungs every 4 (four)  hours as needed for wheezing. 1 Inhaler 12  . metFORMIN (GLUCOPHAGE) 1000 MG tablet Take 1 tablet (1,000 mg total) by mouth daily with breakfast. 60 tablet 11  . ondansetron (ZOFRAN ODT) 8 MG disintegrating tablet Take 1 tablet (8 mg total) by mouth every 8 (eight) hours as needed for nausea or vomiting. 12 tablet 0  . oxyCODONE-acetaminophen (PERCOCET/ROXICET) 5-325 MG per tablet Take 1 tablet by mouth every 4 (four) hours as needed for severe pain. 15 tablet 0  . sildenafil (VIAGRA) 100 MG tablet Take 1 tablet (100 mg total) by mouth daily as needed for erectile dysfunction (pt gets from [employer] note 01/24/14). 10 tablet 3  . spironolactone (ALDACTONE) 25 MG tablet TAKE 1/2 TABLET (12.5 MG TOTAL) BY MOUTH DAILY. 15  tablet 2  . torsemide (DEMADEX) 20 MG tablet Take 2 tablets (40 mg total) by mouth 2 (two) times daily. 60 tablet 6  . XARELTO 20 MG TABS tablet TAKE 1 TABLET (20 MG TOTAL) BY MOUTH DAILY WITH SUPPER. 30 tablet 2  . losartan (COZAAR) 25 MG tablet Take 1 tablet (25 mg total) by mouth daily. 90 tablet 3   No current facility-administered medications for this visit.    Allergies:   Eggs or egg-derived products and Sulfa antibiotics    Social History:  The patient  reports that he has been smoking Cigarettes.  He has a 1.2 pack-year smoking history. He has never used smokeless tobacco. He reports that he drinks alcohol. He reports that he does not use illicit drugs.   Family History:  The patient's family history includes Alzheimer's disease in his maternal grandmother and mother; Breast cancer in his paternal aunt; CAD (age of onset: 55) in his brother; CAD (age of onset: 24) in his mother; Diabetes in his maternal aunt, maternal grandmother, and mother; Gout in his father; Hypertension in his brother and mother; Lung cancer in his father; Seizures in his brother and paternal uncle; Stomach cancer in his father.    ROS:  Please see the history of present illness.   Otherwise, review of  systems are positive for none.   All other systems are reviewed and negative.    PHYSICAL EXAM: VS:  BP 124/72 mmHg  Pulse 91  Ht 5\' 7"  (1.702 m)  Wt 273 lb 12.8 oz (124.195 kg)  BMI 42.87 kg/m2 , BMI Body mass index is 42.87 kg/(m^2). GEN: Well nourished, well developed, in no acute distress HEENT: normal Neck: no JVD, carotid bruits, or masses Cardiac: Regular at 91/m.   no murmurs, rubs, or gallops,no edema  Respiratory:  clear to auscultation bilaterally, normal work of breathing GI: soft, nontender, nondistended, + BS MS: no deformity or atrophy Skin: warm and dry, no rash Neuro:  Strength and sensation are intact Psych: euthymic mood, full affect   EKG:  EKG is ordered today. The ekg ordered today demonstrates atrial flutter fibrillation which is new since 02/05/15.  Heart rate is 91 bpm   Recent Labs: 01/03/2015: TSH 5.852* 02/05/2015: ALT 23; B Natriuretic Peptide 118.6*; BUN 32*; Creatinine 1.31; Hemoglobin 15.7; Platelets 265; Potassium 4.1; Sodium 140    Lipid Panel    Component Value Date/Time   CHOL 186 01/04/2015 0518   TRIG 198* 01/04/2015 0518   HDL 33* 01/04/2015 0518   CHOLHDL 5.6 01/04/2015 0518   VLDL 40 01/04/2015 0518   LDLCALC 113* 01/04/2015 0518      Wt Readings from Last 3 Encounters:  04/27/15 273 lb 12.8 oz (124.195 kg)  02/02/15 271 lb (122.925 kg)  01/26/15 274 lb 8 oz (124.512 kg)        ASSESSMENT AND PLAN:  1. Paroxysmal atrial flutter, now back in atrial flutter fibrillation since previous EKG in March 2016 showing normal sinus rhythm 2. Chronic systolic heart failure 3. Diabetic peripheral neuropathy 4. Morbid obesity   Current medicines are reviewed at length with the patient today. The patient does not have concerns regarding medicines.  The following changes have been made: We are going to add losartan 25 mg one daily for his left ventricular systolic dysfunction.  We will have him return in one week to be sure that  his basal metabolic panel is stable.  We will have him return in 2 months for office visit EKG  and basal metabolic panel.  At this point we will not proceed with another cardioversion.  We will pursue strategy of long-term anticoagulation and rate control.  Consider up titration of carvedilol as necessary to control rate.  He needs to continue to work hard to lose weight.  We reemphasized again today however port it is for him to stay on the Xarelto to prevent embolic stroke. Current medicines are reviewed at length with the patient today.  The patient does not have concerns regarding medicines.  The following changes have been made:  no change  Labs/ tests ordered today include:   Orders Placed This Encounter  Procedures  . Basic metabolic panel  . Basic metabolic panel  . EKG 12-Lead    Disposition: Return in one week for lab work.  Return 2 months for office visit EKG and basal metabolic panel.  Karie Schwalbe MD 04/27/2015 10:24 AM    [facility] [address] [place], [place], [place]  [address] Phone: [phone]; Fax: [phone]

## 2015-05-03 ENCOUNTER — Other Ambulatory Visit (INDEPENDENT_AMBULATORY_CARE_PROVIDER_SITE_OTHER): Payer: PPO | Admitting: *Deleted

## 2015-05-03 DIAGNOSIS — I480 Paroxysmal atrial fibrillation: Secondary | ICD-10-CM | POA: Diagnosis not present

## 2015-05-03 DIAGNOSIS — I428 Other cardiomyopathies: Secondary | ICD-10-CM

## 2015-05-03 DIAGNOSIS — I429 Cardiomyopathy, unspecified: Secondary | ICD-10-CM | POA: Diagnosis not present

## 2015-05-03 LAB — BASIC METABOLIC PANEL
BUN: 20 mg/dL (ref 6–23)
CHLORIDE: 102 meq/L (ref 96–112)
CO2: 24 meq/L (ref 19–32)
Calcium: 8.9 mg/dL (ref 8.4–10.5)
Creatinine, Ser: 1.22 mg/dL (ref 0.40–1.50)
GFR: 79.08 mL/min (ref >60.00)
Glucose, Bld: 196 mg/dL — ABNORMAL HIGH (ref 70–99)
Potassium: 3.4 mEq/L — ABNORMAL LOW (ref 3.5–5.1)
Sodium: 135 mEq/L (ref 135–145)

## 2015-05-03 NOTE — Addendum Note (Signed)
Addended by: Tonita Phoenix on: 05/03/2015 09:19 AM   Modules accepted: Orders

## 2015-05-04 ENCOUNTER — Telehealth: Payer: Self-pay | Admitting: Cardiology

## 2015-05-04 DIAGNOSIS — E876 Hypokalemia: Secondary | ICD-10-CM

## 2015-05-04 MED ORDER — LOSARTAN POTASSIUM 25 MG PO TABS: 25.0000 mg | ORAL_TABLET | Freq: Every day | ORAL | Status: DC

## 2015-05-04 NOTE — Telephone Encounter (Signed)
New message      Want lab results so that he can start taking a new medication

## 2015-05-04 NOTE — Telephone Encounter (Signed)
Spoke with patient regarding labs. When reviewing labs patient asked if should start on the Losartan 25 mg one daily  Dr. Patty Sermons had originally wanted him to start last week. Patient was told not to start secondary to lab results then.  Dr. Patty Sermons out of the office so discussed with Dawayne Patricia NP and will have patient start Losartan 25 mg one tablet daily and hold off on starting K+ at this time. Will recheck bmet 1 week. Advised patient, verbalized understanding

## 2015-05-04 NOTE — Telephone Encounter (Signed)
-----   Message from Cassell Clement, MD sent at 05/03/2015  6:16 PM EDT ----- Labs are stable except potassium is still low.  Start K-Dur 20 milliequivalents one daily

## 2015-05-05 ENCOUNTER — Other Ambulatory Visit (HOSPITAL_COMMUNITY): Payer: Self-pay | Admitting: Cardiology

## 2015-05-05 ENCOUNTER — Other Ambulatory Visit: Payer: Self-pay | Admitting: Internal Medicine

## 2015-05-11 ENCOUNTER — Other Ambulatory Visit (INDEPENDENT_AMBULATORY_CARE_PROVIDER_SITE_OTHER): Payer: PPO | Admitting: *Deleted

## 2015-05-11 DIAGNOSIS — E876 Hypokalemia: Secondary | ICD-10-CM | POA: Diagnosis not present

## 2015-05-11 LAB — BASIC METABOLIC PANEL
BUN: 20 mg/dL (ref 6–23)
CHLORIDE: 100 meq/L (ref 96–112)
CO2: 27 meq/L (ref 19–32)
Calcium: 9.3 mg/dL (ref 8.4–10.5)
Creatinine, Ser: 1.17 mg/dL (ref 0.40–1.50)
GFR: 82.99 mL/min (ref >60.00)
Glucose, Bld: 170 mg/dL — ABNORMAL HIGH (ref 70–99)
Potassium: 4.1 mEq/L (ref 3.5–5.1)
Sodium: 134 mEq/L — ABNORMAL LOW (ref 135–145)

## 2015-05-14 NOTE — Progress Notes (Signed)
Quick Note:  Please report to patient. The recent labs are stable. Continue same medication and careful diet. ______ 

## 2015-05-21 ENCOUNTER — Other Ambulatory Visit: Payer: Self-pay

## 2015-05-21 ENCOUNTER — Other Ambulatory Visit: Payer: Self-pay | Admitting: Cardiology

## 2015-05-30 ENCOUNTER — Ambulatory Visit (INDEPENDENT_AMBULATORY_CARE_PROVIDER_SITE_OTHER): Payer: PPO | Admitting: Internal Medicine

## 2015-05-30 ENCOUNTER — Telehealth: Payer: Self-pay

## 2015-05-30 ENCOUNTER — Encounter: Payer: Self-pay | Admitting: Internal Medicine

## 2015-05-30 VITALS — BP 126/80 | HR 89 | Temp 98.0°F | Resp 22 | Ht 66.5 in | Wt 281.4 lb

## 2015-05-30 DIAGNOSIS — E1342 Other specified diabetes mellitus with diabetic polyneuropathy: Secondary | ICD-10-CM

## 2015-05-30 DIAGNOSIS — G629 Polyneuropathy, unspecified: Secondary | ICD-10-CM

## 2015-05-30 DIAGNOSIS — M1A00X0 Idiopathic chronic gout, unspecified site, without tophus (tophi): Secondary | ICD-10-CM | POA: Diagnosis not present

## 2015-05-30 DIAGNOSIS — F528 Other sexual dysfunction not due to a substance or known physiological condition: Secondary | ICD-10-CM | POA: Diagnosis not present

## 2015-05-30 DIAGNOSIS — I4892 Unspecified atrial flutter: Secondary | ICD-10-CM | POA: Diagnosis not present

## 2015-05-30 DIAGNOSIS — I10 Essential (primary) hypertension: Secondary | ICD-10-CM

## 2015-05-30 DIAGNOSIS — M1990 Unspecified osteoarthritis, unspecified site: Secondary | ICD-10-CM

## 2015-05-30 DIAGNOSIS — G4733 Obstructive sleep apnea (adult) (pediatric): Secondary | ICD-10-CM

## 2015-05-30 DIAGNOSIS — I5022 Chronic systolic (congestive) heart failure: Secondary | ICD-10-CM

## 2015-05-30 DIAGNOSIS — Z7901 Long term (current) use of anticoagulants: Secondary | ICD-10-CM

## 2015-05-30 DIAGNOSIS — K8020 Calculus of gallbladder without cholecystitis without obstruction: Secondary | ICD-10-CM | POA: Diagnosis not present

## 2015-05-30 DIAGNOSIS — E1142 Type 2 diabetes mellitus with diabetic polyneuropathy: Secondary | ICD-10-CM

## 2015-05-30 MED ORDER — HYDROCODONE-ACETAMINOPHEN 5-325 MG PO TABS: 1.0000 | ORAL_TABLET | Freq: Four times a day (QID) | ORAL | Status: DC | PRN

## 2015-05-30 MED ORDER — TADALAFIL 20 MG PO TABS: 20.0000 mg | ORAL_TABLET | Freq: Every day | ORAL | Status: DC | PRN

## 2015-05-30 MED ORDER — ALBUTEROL SULFATE HFA 108 (90 BASE) MCG/ACT IN AERS: 2.0000 | INHALATION_SPRAY | RESPIRATORY_TRACT | Status: DC | PRN

## 2015-05-30 NOTE — Telephone Encounter (Signed)
Garment/textile technologist for [employer] [phone]  Patient id # [id number]  Order #[id number] 7-10 days for receiving

## 2015-05-30 NOTE — Patient Instructions (Signed)
Continue current medications as ordered  Follow up with specialists as scheduled  Follow up in 1 month for CPE. Will call with lab results

## 2015-05-30 NOTE — Progress Notes (Signed)
Patient ID: Jeremiah Perkins, male   DOB: [birth year], 55 y.o.   MRN: [id number]    Location:    [facility]   Place of Service:   OFFICE   Chief Complaint  Patient presents with  . Establish Care    Establish care    HPI:  54 yo male seen today as a new pt. He reports "not feeling well this morning" but cannot be more specific. He is c/a knots on elbows and knees. He has hx gout that is being treated by previous PCP. Last uric acid level 11.5 in March 2016. He has taken colchicine prn and was taking allopurinol but was taken off due to renal insufficiency. He has an Scientist, research (life sciences) for knee OA. He has never seen a rheumatologist. He takes prn norco  He sees cardio Dr Patty Sermons for atrial flutter and HTN. He is taking amiodarone and coreg which controls rate but was told he may need another cardioversion. Losartan also Rx "to help with SOB". He takes xeralto daily. No longer taking lipitor due to ED issues and refuses to resume med. He states he would "rather have a heart attack" if it means a better sex life now  He is actively smoking about 3 cigs per week.  He has not been taking metformin daily. Last A1c 7% in Feb 2016. He states he was told to only take med 3 times per week. Last eye exam in Jan 2016 which showed no retinopathy.  He takes demadex CHF. He no longer takes aldactone as it was d/c'd by cardio per pt.  He is c/a weight gain. He maintains a heart healthy diet including plenty of fruits/veggies and exercises several times per week including riding bicycle, push mowing lawn, walking, playing golf.  He uses xopenex prn wheezing. He has chronic bronchitis.  He has gallstones and needs surg but wife just had back sx and he postponed procedure. No c/o abdominal pain now  Past Medical History  Diagnosis Date  . Hypertension   . Congestive heart failure     No echo data available. Myocardial perfusion 2005 with EF 61%, presumed diastolic HF   . Diverticulosis     with history of  diverticulitis in 10/2011  . Gout   . Major depression   . Anxiety   . Psychosexual dysfunction with inhibited sexual excitement   . Hyperlipidemia   . Atrial flutter   . Morbid obesity   . Chronic bronchitis     "get it q yr" (01/02/2015)  . Shortness of breath     "all the time" (07/26/2013)  . Chronic back pain   . GERD (gastroesophageal reflux disease)   . Asthma   . Myocardial infarction     "one doctor said I did back in 2014"  . Pneumonia 2013; 2014  . OSA (obstructive sleep apnea) 1998    "wear my CPAP a couple times/month" (01/02/2015)  . Diabetic peripheral neuropathy   . Type II diabetes mellitus dx'd 1991    previously on insulin in 2000 for 3-4 years (but was stopped because adamantly did not want to be on insulin)  . Arthritis     involving knees, ankles, back, elbows (01/02/2015)    Past Surgical History  Procedure Laterality Date  . Ankle reconstruction Right 1990's    2/2 trauma sustained after fall  . 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 Cyst removal leg Left 1960's    back of  leg  . Rhae Hammock  without cardioversion N/A 07/28/2013    Procedure: TRANSESOPHAGEAL ECHOCARDIOGRAM (TEE);  Surgeon: Laurey Morale, MD;  Location: [facility] ENDOSCOPY;  Service: Cardiovascular;  Laterality: N/A;  . Cardioversion N/A 07/28/2013    Procedure: CARDIOVERSION;  Surgeon: Laurey Morale, MD;  Location: [facility] ENDOSCOPY;  Service: Cardiovascular;  Laterality: N/A;  . Cardioversion N/A 10/26/2013    Procedure: CARDIOVERSION;  Surgeon: Cassell Clement, MD;  Location: [facility] ENDOSCOPY;  Service: Cardiovascular;  Laterality: N/A;  . Left heart catheterization with coronary angiogram N/A 07/27/2013    Procedure: LEFT HEART CATHETERIZATION WITH CORONARY ANGIOGRAM;  Surgeon: Peter M Swaziland, MD;  Location: [facility] CATH LAB;  Service: Cardiovascular;  Laterality: N/A;  . Cardiac catheterization  1990's; 07/2013  . Finger amputation      Patient Care Team: Courtney Paris, MD as PCP - General (Internal  Medicine) Burns Spain, MD as Referring Physician (Internal Medicine) Lavone Orn, RN as Registered Nurse Cassell Clement, MD as Consulting Physician (Cardiology)  History   Social History  . Marital Status: Married    Spouse Name: N/A  . Number of Children: 3  . Years of Education: 14   Occupational History  . Now unemployed     Barrister's clerk at [employer]  .  [employer]   Social History Main Topics  . Smoking status: Current Some Day Smoker -- 0.12 packs/day for 10 years    Types: Cigarettes  . Smokeless tobacco: Never Used     Comment: rarely.  . Alcohol Use: 0.0 oz/week    0 Standard drinks or equivalent per week     Comment: Rarely.  . Drug Use: No  . Sexual Activity: Not on file   Other Topics Concern  . 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 They each have children but none between the two of them.      Diet:      Do you drink/ eat things with caffeine?  Yes      Marital status:   Married                             What year were you married ? 2010      Do you live in a house, apartment,assistred living, condo, trailer, etc.)? House      Is it one or more stories? Yes      How many persons live in your home ? 3      Do you have any pets in your home ?(please list) Dog      Current or past profession: Aircraft Mechanic/Instructor      Do you exercise?  Yes                            Type & how often:  Golf,cut grass, walk      Do you have a living will? No      Do you have a DNR form?   No                    If not, do you want to discuss one?       Do you have signed POA?HPOA forms?   No              If so, please bring to your  appointment           reports that he has been smoking Cigarettes.  He has a 1.2 pack-year smoking history. He has never used smokeless tobacco. He reports that he drinks alcohol. He reports that he does not use illicit  drugs.  Family History  Problem Relation Age of Onset  . Diabetes Mother   . CAD Mother 77    requiring quadruple bypass  . Hypertension Mother   . Alzheimer's disease Mother   . Stomach cancer Father   . Lung cancer Father     was a smoker  . Gout Father   . CAD Brother 48    requiring quadruple bypass  . Seizures Brother   . Hypertension Brother   . Diabetes Maternal Grandmother   . Alzheimer's disease Maternal Grandmother   . Breast cancer Paternal Aunt   . Diabetes Maternal Aunt   . Seizures Paternal Uncle    Family Status  Relation Status Death Age  . Mother Deceased   . Father Deceased   . Brother Alive   . Brother Deceased   . Brother Deceased   . Brother Deceased   . Brother Deceased   . Son Alive   . Daughter Alive   . Daughter Alive     Immunization History  Administered Date(s) Administered  . Influenza,inj,Quad PF,36+ Mos 07/19/2013, 07/28/2014  . Influenza-Unspecified 09/09/2012  . Pneumococcal Polysaccharide-23 07/12/2013  . Tdap 07/19/2013    Allergies  Allergen Reactions  . Eggs Or Egg-Derived Products Nausea And Vomiting    Did ok with flu shot, seems to be an issue with the type of preparation of egg product.  . Sulfa Antibiotics Nausea And Vomiting    Medications: Patient's Medications  New Prescriptions   No medications on file  Previous Medications   ALBUTEROL (PROAIR HFA) 108 (90 BASE) MCG/ACT INHALER    Inhale 1-2 puff every 4-6 hours   AMIODARONE (PACERONE) 200 MG TABLET    TAKE 1 TABLET (200 MG TOTAL) BY MOUTH DAILY.   ASPIRIN-SOD BICARB-CITRIC ACID (ALKA-SELTZER) 325 MG TBEF TABLET    Take 325 mg by mouth every 6 (six) hours as needed (heartburn).   ATORVASTATIN (LIPITOR) 40 MG TABLET    Take 1 tablet (40 mg total) by mouth daily.   BENZALKONIUM CHLORIDE 50 % SOLN    Clean scrotal area daily.   BISMUTH SUBSALICYLATE (PEPTO BISMOL) 262 MG/15ML SUSPENSION    Take 30 mLs by mouth every 6 (six) hours as needed for indigestion.    CARVEDILOL (COREG) 3.125 MG TABLET    Take 1 tablet (3.125 mg total) by mouth 2 (two) times daily.   COLCHICINE 0.6 MG TABLET    BEGIN TAKING 2 TABLETS NOW. AND, 1 HOUR LATER TAKE 1 TABLET. THEN TAKE 1 TABLET DAILY.   DICLOFENAC SODIUM (VOLTAREN) 1 % GEL    Apply 2 g topically 4 (four) times daily.   DOCUSATE SODIUM (COLACE) 100 MG CAPSULE    Take 100 mg by mouth daily as needed for mild constipation. For constipation   FAMOTIDINE (PEPCID) 20 MG TABLET    Take 1 tablet (20 mg total) by mouth 2 (two) times daily.   GABAPENTIN (NEURONTIN) 600 MG TABLET    Take 1 tablet (600 mg total) by mouth 2 (two) times daily. Takes 600mg  in the am & 1200mg  in the pm   HYDROCODONE-ACETAMINOPHEN (NORCO/VICODIN) 5-325 MG PER TABLET    Take 1-2 tablets by mouth every 6 (six) hours as needed for  moderate pain.   LEVALBUTEROL (XOPENEX HFA) 45 MCG/ACT INHALER    Inhale 1-2 puffs into the lungs every 4 (four) hours as needed for wheezing.   LOSARTAN (COZAAR) 25 MG TABLET    Take 1 tablet (25 mg total) by mouth daily.   METFORMIN (GLUCOPHAGE) 1000 MG TABLET    Take 1 tablet (1,000 mg total) by mouth daily with breakfast.   ONDANSETRON (ZOFRAN ODT) 8 MG DISINTEGRATING TABLET    Take 1 tablet (8 mg total) by mouth every 8 (eight) hours as needed for nausea or vomiting.   OXYCODONE-ACETAMINOPHEN (PERCOCET/ROXICET) 5-325 MG PER TABLET    Take 1 tablet by mouth every 4 (four) hours as needed for severe pain.   SILDENAFIL (VIAGRA) 100 MG TABLET    Take 1 tablet (100 mg total) by mouth daily as needed for erectile dysfunction (pt gets from [employer] note 01/24/14).   SPIRONOLACTONE (ALDACTONE) 25 MG TABLET    TAKE 1/2 TABLET (12.5 MG TOTAL) BY MOUTH DAILY.   TORSEMIDE (DEMADEX) 20 MG TABLET    Take 2 tablets (40 mg total) by mouth 2 (two) times daily.   XARELTO 20 MG TABS TABLET    TAKE 1 TABLET (20 MG TOTAL) BY MOUTH DAILY WITH SUPPER.  Modified Medications   No medications on file  Discontinued Medications   No medications on file     Review of Systems  Unable to perform ROS: Psychiatric disorder  Constitutional: Positive for fever.       Night sweats  HENT: Positive for dental problem (wears dentures/has bridge).   Eyes: Positive for visual disturbance (wears glasses).  Respiratory: Positive for wheezing.   Cardiovascular: Positive for palpitations.  Gastrointestinal:       Gallbladder problems  Musculoskeletal: Positive for back pain, joint swelling and arthralgias.  Neurological:       Loss of balance; memory loss  Psychiatric/Behavioral: Positive for dysphoric mood and agitation. The patient is nervous/anxious.        Prior psych intervention; increased stress  above taken from new pt packet  Filed Vitals:   05/30/15 1031  BP: 126/80  Pulse: 89  Temp: 98 F (36.7 C)  TempSrc: Oral  Resp: 22  Height: 5' 6.5" (1.689 m)  Weight: 281 lb 6.4 oz (127.642 kg)  SpO2: 95%   Body mass index is 44.74 kg/(m^2).  Physical Exam  Constitutional: He is oriented to person, place, and time. He appears well-developed and well-nourished.  HENT:  Mouth/Throat: Oropharynx is clear and moist.  Eyes: Pupils are equal, round, and reactive to light. No scleral icterus.  Neck: Neck supple. Carotid bruit is not present. No thyromegaly present.  Cardiovascular: Normal rate and intact distal pulses.  An irregularly irregular rhythm present. Exam reveals no gallop and no friction rub.   Murmur heard.  Systolic murmur is present with a grade of 1/6  no distal LE swelling. No calf TTP  Pulmonary/Chest: Effort normal and breath sounds normal. He has no wheezes. He has no rales. He exhibits no tenderness.  Abdominal: Soft. Bowel sounds are normal. He exhibits no distension, no abdominal bruit, no pulsatile midline mass and no mass. There is tenderness (RUQ TTP). There is no rebound and no guarding.  Musculoskeletal: He exhibits edema and tenderness.  Left elbow bursitis and distal arm egg sized lump, NT. Reduced elbow ROM   Lymphadenopathy:    He has no cervical adenopathy.  Neurological: He is alert and oriented to person, place, and time.  Skin: Skin is warm  and dry. No rash noted.  Psychiatric: He has a normal mood and affect. His behavior is normal. Judgment and thought content normal.     Labs reviewed: Lab on 05/11/2015  Component Date Value Ref Range Status  . Sodium 05/11/2015 134* 135 - 145 mEq/L Final  . Potassium 05/11/2015 4.1  3.5 - 5.1 mEq/L Final  . Chloride 05/11/2015 100  96 - 112 mEq/L Final  . CO2 05/11/2015 27  19 - 32 mEq/L Final  . Glucose, Bld 05/11/2015 170* 70 - 99 mg/dL Final  . BUN 16/08/9603 20  6 - 23 mg/dL Final  . Creatinine, Ser 05/11/2015 1.17  0.40 - 1.50 mg/dL Final  . Calcium 54/07/8118 9.3  8.4 - 10.5 mg/dL Final  . GFR 14/78/2956 82.99  >60.00 mL/min Final  Lab on 05/03/2015  Component Date Value Ref Range Status  . Sodium 05/03/2015 135  135 - 145 mEq/L Final  . Potassium 05/03/2015 3.4* 3.5 - 5.1 mEq/L Final  . Chloride 05/03/2015 102  96 - 112 mEq/L Final  . CO2 05/03/2015 24  19 - 32 mEq/L Final  . Glucose, Bld 05/03/2015 196* 70 - 99 mg/dL Final  . BUN 21/30/8657 20  6 - 23 mg/dL Final  . Creatinine, Ser 05/03/2015 1.22  0.40 - 1.50 mg/dL Final  . Calcium 84/69/6295 8.9  8.4 - 10.5 mg/dL Final  . GFR 28/41/3244 79.08  >60.00 mL/min Final  Lab on 04/27/2015  Component Date Value Ref Range Status  . Sodium 04/27/2015 135  135 - 145 mEq/L Final  . Potassium 04/27/2015 3.4* 3.5 - 5.1 mEq/L Final  . Chloride 04/27/2015 98  96 - 112 mEq/L Final  . CO2 04/27/2015 27  19 - 32 mEq/L Final  . Glucose, Bld 04/27/2015 209* 70 - 99 mg/dL Final  . BUN 11/26/7251 32* 6 - 23 mg/dL Final  . Creatinine, Ser 04/27/2015 1.64* 0.40 - 1.50 mg/dL Final  . Calcium 66/44/0347 9.1  8.4 - 10.5 mg/dL Final  . GFR 42/59/5638 56.21* >60.00 mL/min Final    No results found.   Assessment/Plan    ICD-9-CM ICD-10-CM   1. Arthritis - pain uncontrolled 716.90 M19.90  HYDROcodone-acetaminophen (NORCO/VICODIN) 5-325 MG per tablet  2. 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 Diabetic peripheral neuropathy - uncontrolled pain 250.60 E13.42 CMP   357.2 G62.9 Hemoglobin A1c     Lipid Panel  4. Chronic systolic heart failure - stable 428.22 I50.22   5. Atrial flutter, unspecified - rate controlled 427.32 I48.92   6. Long term current use of anticoagulant therapy due to #4 V58.61 Z79.01   7. Psychosexual dysfunction with inhibited sexual excitement - multiple factors contributing (chronic medical conditions + meds) 302.72 F52.8   8. OSA (obstructive sleep apnea) - unchanged 327.23 G47.33   9. Morbid obesity 278.01 E66.01   10. Essential hypertension - stable 401.9 I10 CMP  11. Calculus of gallbladder w/o mention of cholecystitis or obstruction - currently asymptomatic 574.20 K80.20    --refer to rheumatology for arthritis and gout  --Continue current medications as ordered  --Follow up with specialists as scheduled  --Follow up in 1 month for CPE  [facility] S. Ancil Linsey  [facility] [address] [place], [place] [address] [phone] Cell (Monday-Friday 8 AM - 5 PM) [phone] After 5 PM and follow prompts

## 2015-05-31 DIAGNOSIS — M1000 Idiopathic gout, unspecified site: Secondary | ICD-10-CM | POA: Insufficient documentation

## 2015-05-31 LAB — LIPID PANEL
CHOLESTEROL TOTAL: 160 mg/dL (ref 100–199)
Chol/HDL Ratio: 4 ratio units (ref 0.0–5.0)
HDL: 40 mg/dL (ref >39)
LDL Calculated: 88 mg/dL (ref 0–99)
Triglycerides: 158 mg/dL — ABNORMAL HIGH (ref 0–149)
VLDL CHOLESTEROL CAL: 32 mg/dL (ref 5–40)

## 2015-05-31 LAB — COMPREHENSIVE METABOLIC PANEL
ALBUMIN: 4 g/dL (ref 3.5–5.5)
ALT: 21 IU/L (ref 0–44)
AST: 25 IU/L (ref 0–40)
Albumin/Globulin Ratio: 1.3 (ref 1.1–2.5)
Alkaline Phosphatase: 96 IU/L (ref 39–117)
BUN/Creatinine Ratio: 18 (ref 9–20)
BUN: 23 mg/dL (ref 6–24)
Bilirubin Total: 1 mg/dL (ref 0.0–1.2)
CO2: 25 mmol/L (ref 18–29)
CREATININE: 1.28 mg/dL — AB (ref 0.76–1.27)
Calcium: 9.1 mg/dL (ref 8.7–10.2)
Chloride: 96 mmol/L — ABNORMAL LOW (ref 97–108)
GFR calc Af Amer: 72 mL/min/{1.73_m2} (ref >59)
GFR calc non Af Amer: 63 mL/min/{1.73_m2} (ref >59)
GLOBULIN, TOTAL: 3.2 g/dL (ref 1.5–4.5)
GLUCOSE: 150 mg/dL — AB (ref 65–99)
Potassium: 4.2 mmol/L (ref 3.5–5.2)
Sodium: 143 mmol/L (ref 134–144)
TOTAL PROTEIN: 7.2 g/dL (ref 6.0–8.5)

## 2015-05-31 LAB — HEMOGLOBIN A1C
ESTIMATED AVERAGE GLUCOSE: 146 mg/dL
Hgb A1c MFr Bld: 6.7 % — ABNORMAL HIGH (ref 4.8–5.6)

## 2015-06-01 NOTE — Telephone Encounter (Signed)
erroneous

## 2015-06-08 ENCOUNTER — Telehealth: Payer: Self-pay | Admitting: *Deleted

## 2015-06-08 NOTE — Telephone Encounter (Signed)
Spoke with patient and let him know that his viagra had arrived from the company.

## 2015-06-19 ENCOUNTER — Ambulatory Visit (INDEPENDENT_AMBULATORY_CARE_PROVIDER_SITE_OTHER): Payer: PPO | Admitting: Cardiology

## 2015-06-19 ENCOUNTER — Encounter: Payer: Self-pay | Admitting: Cardiology

## 2015-06-19 VITALS — BP 112/84 | HR 103 | Ht 67.0 in | Wt 275.8 lb

## 2015-06-19 DIAGNOSIS — I428 Other cardiomyopathies: Secondary | ICD-10-CM

## 2015-06-19 DIAGNOSIS — I5022 Chronic systolic (congestive) heart failure: Secondary | ICD-10-CM | POA: Diagnosis not present

## 2015-06-19 DIAGNOSIS — I429 Cardiomyopathy, unspecified: Secondary | ICD-10-CM

## 2015-06-19 DIAGNOSIS — I480 Paroxysmal atrial fibrillation: Secondary | ICD-10-CM | POA: Diagnosis not present

## 2015-06-19 MED ORDER — CARVEDILOL 6.25 MG PO TABS: 6.2500 mg | ORAL_TABLET | Freq: Two times a day (BID) | ORAL | Status: DC

## 2015-06-19 NOTE — Patient Instructions (Signed)
Medication Instructions:  STOP AMIODARONE   INCREASE YOUR CARVEDILOL TO 6.25 MG TWICE A DAY   Labwork: NONE  Testing/Procedures: NONE  Follow-Up: Your physician recommends that you schedule a follow-up appointment in: 4 MONTH OV/EKG

## 2015-06-19 NOTE — Progress Notes (Signed)
Cardiology Office Note   Date:  06/19/2015   ID:  Jeremiah Perkins, DOB 05/17/1961, MRN [id number]  PCP:  Kirt Boys, DO  Cardiologist: Cassell Clement MD  No chief complaint on file.     History of Present Illness: Jeremiah Perkins is a 54 y.o. male who presents for follow-up office visit  This pleasant 54 year old African American gentleman is seen for a office visit. The patient underwent elective outpatient cardioversion on 10/26/13. He hadremained in normal sinus rhythm. His most recent EKG of 02/05/15 showed normal sinus rhythm. Today he is back in atrial flutter fibrillation. He was not aware that his heart rhythm had changed. Marland Kitchen He has a history of a nonischemic cardiomyopathy with previous catheterization showing no significant obstructive disease. His ejection fraction is estimated at 20-25%. He is diuresing on his current outpatient regimen. He still notes significant dyspnea with minimal exertion. Marland Kitchen He does have occasional chest discomfort. The patient also has a history of diabetes mellitus type 2 which is followed at the internal medicine clinic. He has a history of obstructive sleep apnea and morbid obesity and diabetic peripheral neuropathy. He has a past history of hypertension and gout. He has a past history of peripheral neuropathy and is on gabapentin The patient has finished the the cardiac rehabilitation program. He elected not to go into the maintenance phase because it would have been expensive for him. The patient reiterated today that he is not interested in an ICD or a pacemaker. He has been getting some exercise. He has been playing some golf. Yesterday he cut his grass and did a lot of physical yardwork. He denies exertional chest pain dizziness or syncope. He does have exertional dyspnea and has to stop and rest periodically. He has known cholelithiasis. Surgery was discussed. Dr. Suzzanne Cloud is his surgeon. Previously scheduled  surgery was canceled because the patient's wife got sick and he had to be her caregiver. He has not been having any recent symptoms from his gallbladder and he is not sure whether he will proceed with surgery or not. Since last visit he has had less shortness of breath.  He has had to use his pro air inhaler occasionally for wheezing.  He is not having any chest pain. He has been on amiodarone.  He remains in atrial flutter with controlled ventricular response.  Past Medical History  Diagnosis Date  . Hypertension   . Congestive heart failure     No echo data available. Myocardial perfusion 2005 with EF 61%, presumed diastolic HF   . Diverticulosis     with history of diverticulitis in 10/2011  . Gout   . Major depression   . Anxiety   . Psychosexual dysfunction with inhibited sexual excitement   . Hyperlipidemia   . Atrial flutter   . Morbid obesity   . Chronic bronchitis     "get it q yr" (01/02/2015)  . Shortness of breath     "all the time" (07/26/2013)  . Chronic back pain   . GERD (gastroesophageal reflux disease)   . Asthma   . Myocardial infarction     "one doctor said I did back in 2014"  . Pneumonia 2013; 2014  . OSA (obstructive sleep apnea) 1998    "wear my CPAP a couple times/month" (01/02/2015)  . Diabetic peripheral neuropathy   . Type II diabetes mellitus dx'd 1991    previously on insulin in 2000 for 3-4 years (but was stopped because adamantly did not  want to be on insulin)  . Arthritis     involving knees, ankles, back, elbows (01/02/2015)  . Gout     Past Surgical History  Procedure Laterality Date  . Ankle reconstruction Right 1990's    2/2 trauma sustained after fall  . 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 Cyst removal leg Left 1960's    back of  leg  . Tee without cardioversion N/A 07/28/2013    Procedure: TRANSESOPHAGEAL ECHOCARDIOGRAM (TEE);  Surgeon: Laurey Morale, MD;  Location: [facility] ENDOSCOPY;  Service: Cardiovascular;  Laterality: N/A;  .  Cardioversion N/A 07/28/2013    Procedure: CARDIOVERSION;  Surgeon: Laurey Morale, MD;  Location: [facility] ENDOSCOPY;  Service: Cardiovascular;  Laterality: N/A;  . Cardioversion N/A 10/26/2013    Procedure: CARDIOVERSION;  Surgeon: Cassell Clement, MD;  Location: [facility] ENDOSCOPY;  Service: Cardiovascular;  Laterality: N/A;  . Left heart catheterization with coronary angiogram N/A 07/27/2013    Procedure: LEFT HEART CATHETERIZATION WITH CORONARY ANGIOGRAM;  Surgeon: Peter M Swaziland, MD;  Location: [facility] CATH LAB;  Service: Cardiovascular;  Laterality: N/A;  . Cardiac catheterization  1990's; 07/2013  . Finger amputation       Current Outpatient Prescriptions  Medication Sig Dispense Refill  . albuterol (PROAIR HFA) 108 (90 BASE) MCG/ACT inhaler Inhale 2 puffs into the lungs every 4 (four) hours as needed for wheezing or shortness of breath. Inhale 1-2 puff every 4-6 hours 18 g 3  . aspirin-sod bicarb-citric acid (ALKA-SELTZER) 325 MG TBEF tablet Take 325 mg by mouth every 6 (six) hours as needed (heartburn).    . bismuth subsalicylate (PEPTO BISMOL) 262 MG/15ML suspension Take 30 mLs by mouth every 6 (six) hours as needed for indigestion.    . carvedilol (COREG) 6.25 MG tablet Take 1 tablet (6.25 mg total) by mouth 2 (two) times daily. 60 tablet 5  . colchicine 0.6 MG tablet BEGIN TAKING 2 TABLETS NOW. AND, 1 HOUR LATER TAKE 1 TABLET. THEN TAKE 1 TABLET DAILY. 30 tablet 1  . diclofenac sodium (VOLTAREN) 1 % GEL Apply 2 g topically 4 (four) times daily.    Marland Kitchen docusate sodium (COLACE) 100 MG capsule Take 100 mg by mouth daily as needed for mild constipation. For constipation    . famotidine (PEPCID) 20 MG tablet Take 20 mg by mouth 2 (two) times daily as needed for heartburn or indigestion.    . gabapentin (NEURONTIN) 600 MG tablet Take 1 tablet (600 mg total) by mouth 2 (two) times daily. Takes 600mg  in the am & 1200mg  in the pm 60 tablet 5  . HYDROcodone-acetaminophen (NORCO/VICODIN) 5-325 MG per tablet Take  1-2 tablets by mouth every 6 (six) hours as needed for moderate pain. 60 tablet 0  . levalbuterol (XOPENEX HFA) 45 MCG/ACT inhaler Inhale 1-2 puffs into the lungs every 4 (four) hours as needed for wheezing. 1 Inhaler 12  . losartan (COZAAR) 25 MG tablet Take 1 tablet (25 mg total) by mouth daily. 30 tablet 5  . metFORMIN (GLUCOPHAGE) 1000 MG tablet Take 1 tablet (1,000 mg total) by mouth daily with breakfast. 60 tablet 11  . ondansetron (ZOFRAN ODT) 8 MG disintegrating tablet Take 1 tablet (8 mg total) by mouth every 8 (eight) hours as needed for nausea or vomiting. 12 tablet 0  . oxyCODONE-acetaminophen (PERCOCET/ROXICET) 5-325 MG per tablet Take 1 tablet by mouth every 4 (four) hours as needed for severe pain. 15 tablet 0  . sildenafil (VIAGRA) 100 MG tablet Take 100  mg by mouth daily as needed for erectile dysfunction.    Marland Kitchen spironolactone (ALDACTONE) 25 MG tablet TAKE 1/2 TABLET (12.5 MG TOTAL) BY MOUTH DAILY. 15 tablet 2  . torsemide (DEMADEX) 20 MG tablet Take 2 tablets (40 mg total) by mouth 2 (two) times daily. 60 tablet 6  . XARELTO 20 MG TABS tablet TAKE 1 TABLET (20 MG TOTAL) BY MOUTH DAILY WITH SUPPER. 30 tablet 6   No current facility-administered medications for this visit.    Allergies:   Eggs or egg-derived products and Sulfa antibiotics    Social History:  The patient  reports that he has been smoking Cigarettes.  He has a 1.2 pack-year smoking history. He has never used smokeless tobacco. He reports that he drinks alcohol. He reports that he does not use illicit drugs.   Family History:  The patient's family history includes Alzheimer's disease in his maternal grandmother and mother; Breast cancer in his paternal aunt; CAD (age of onset: 42) in his brother; CAD (age of onset: 69) in his mother; Diabetes in his maternal aunt, maternal grandmother, and mother; Gout in his father; Hypertension in his brother and mother; Lung cancer in his father; Seizures in his brother and paternal  uncle; Stomach cancer in his father.    ROS:  Please see the history of present illness.   Otherwise, review of systems are positive for none.   All other systems are reviewed and negative.    PHYSICAL EXAM: VS:  BP 112/84 mmHg  Pulse 103  Ht 5\' 7"  (1.702 m)  Wt 275 lb 12.8 oz (125.102 kg)  BMI 43.19 kg/m2 , BMI Body mass index is 43.19 kg/(m^2). GEN: Well nourished, well developed, in no acute distress HEENT: normal Neck: no JVD, carotid bruits, or masses Cardiac: RRR; no murmurs, rubs, or gallops,no edema.  Slow atrial flutter with 2 to one block.  Respiratory:  clear to auscultation bilaterally, normal work of breathing GI: soft, nontender, nondistended, + BS MS: no deformity or atrophy Skin: warm and dry, no rash Neuro:  Strength and sensation are intact Psych: euthymic mood, full affect   EKG:  EKG is ordered today. The ekg ordered today demonstrates slow atrial flutter with 2 to one ventricular response.  Ventricular rate is 103 bpm.   Recent Labs: 01/03/2015: TSH 5.852* 02/05/2015: B Natriuretic Peptide 118.6*; Hemoglobin 15.7; Platelets 265 05/30/2015: ALT 21; BUN 23; Creatinine, Ser 1.28*; Potassium 4.2; Sodium 143    Lipid Panel    Component Value Date/Time   CHOL 160 05/30/2015 1208   CHOL 186 01/04/2015 0518   TRIG 158* 05/30/2015 1208   HDL 40 05/30/2015 1208   HDL 33* 01/04/2015 0518   CHOLHDL 4.0 05/30/2015 1208   CHOLHDL 5.6 01/04/2015 0518   VLDL 40 01/04/2015 0518   LDLCALC 88 05/30/2015 1208   LDLCALC 113* 01/04/2015 0518      Wt Readings from Last 3 Encounters:  06/19/15 275 lb 12.8 oz (125.102 kg)  05/30/15 281 lb 6.4 oz (127.642 kg)  04/27/15 273 lb 12.8 oz (124.195 kg)         ASSESSMENT AND PLAN: 1. Paroxysmal atrial flutter fibrillation,, now back in atrial flutter since previous EKG in March 2016 showing normal sinus rhythm 2. Chronic systolic heart failure 3. Diabetic peripheral neuropathy 4. Morbid obesity   Current  medicines are reviewed at length with the patient today. The patient does not have concerns regarding medicines.   Current medicines are reviewed at length with the patient today.  The patient does not have concerns regarding medicines.   Current medicines are reviewed at length with the patient today.  The patient does not have concerns regarding medicines.  The following changes have been made:  We are going to stop his amiodarone at this point.  There is no point in keeping the patient on it since we are pursuing a strategy of rate control and long-term anticoagulation. For additional rate control we are increasing his available to 6.25 mg twice a day  Labs/ tests ordered today include:   Orders Placed This Encounter  Procedures  . EKG 12-Lead    Disposition: Stop amiodarone.  Increase carvedilol.  Recheck in 4 months for office visit and EKG.  Continue long-term Xarelto.  Karie Schwalbe MD 06/19/2015 7:08 PM    [facility] [address] [place], [place], [place]  [address] Phone: [phone]; Fax: [phone]

## 2015-06-23 ENCOUNTER — Other Ambulatory Visit: Payer: Self-pay | Admitting: Internal Medicine

## 2015-06-24 ENCOUNTER — Other Ambulatory Visit: Payer: Self-pay | Admitting: Internal Medicine

## 2015-06-28 ENCOUNTER — Other Ambulatory Visit: Payer: Self-pay | Admitting: Internal Medicine

## 2015-07-02 ENCOUNTER — Telehealth: Payer: Self-pay | Admitting: *Deleted

## 2015-07-02 DIAGNOSIS — M1990 Unspecified osteoarthritis, unspecified site: Secondary | ICD-10-CM

## 2015-07-02 MED ORDER — GABAPENTIN 600 MG PO TABS: ORAL_TABLET | ORAL | Status: DC

## 2015-07-02 NOTE — Telephone Encounter (Signed)
No - he told me he was only taking the norco and not the oxy

## 2015-07-02 NOTE — Telephone Encounter (Signed)
Patient called and stated that he wanted a refill on his Oxycodone stated that he takes both Oxy and Hydrocodone but switches between the two. Refilled the Hydrocodone at his appointment on 05/30/2015. Is it ok to refill? Please Advise.

## 2015-07-03 ENCOUNTER — Other Ambulatory Visit: Payer: Self-pay | Admitting: Internal Medicine

## 2015-07-03 ENCOUNTER — Other Ambulatory Visit: Payer: Self-pay | Admitting: *Deleted

## 2015-07-03 MED ORDER — HYDROCODONE-ACETAMINOPHEN 5-325 MG PO TABS: 1.0000 | ORAL_TABLET | Freq: Four times a day (QID) | ORAL | Status: DC | PRN

## 2015-07-03 MED ORDER — COLCHICINE 0.6 MG PO TABS: ORAL_TABLET | ORAL | Status: DC

## 2015-07-03 NOTE — Telephone Encounter (Signed)
Patient requested to be faxed to pharmacy 

## 2015-07-03 NOTE — Telephone Encounter (Signed)
Patient didn't seem happy with this response. But stated he would come pick up the Norco.

## 2015-07-10 ENCOUNTER — Telehealth: Payer: Self-pay | Admitting: Cardiology

## 2015-07-10 NOTE — Telephone Encounter (Signed)
New message       Calling to check the status on his medication (forms to be completed) from [employer].  Please call and let him know when the paper work is completed.

## 2015-07-10 NOTE — Telephone Encounter (Signed)
Left message to call back  

## 2015-07-16 ENCOUNTER — Telehealth: Payer: Self-pay | Admitting: *Deleted

## 2015-07-16 ENCOUNTER — Encounter (HOSPITAL_COMMUNITY): Payer: Self-pay

## 2015-07-16 ENCOUNTER — Emergency Department (HOSPITAL_COMMUNITY): Payer: PPO

## 2015-07-16 ENCOUNTER — Emergency Department (HOSPITAL_COMMUNITY)
Admission: EM | Admit: 2015-07-16 | Discharge: 2015-07-16 | Disposition: A | Discharge status: Home / Self Care | Payer: PPO | Attending: Physician Assistant | Admitting: Physician Assistant

## 2015-07-16 DIAGNOSIS — I509 Heart failure, unspecified: Secondary | ICD-10-CM | POA: Diagnosis not present

## 2015-07-16 DIAGNOSIS — G4733 Obstructive sleep apnea (adult) (pediatric): Secondary | ICD-10-CM | POA: Insufficient documentation

## 2015-07-16 DIAGNOSIS — Z720 Tobacco use: Secondary | ICD-10-CM | POA: Diagnosis not present

## 2015-07-16 DIAGNOSIS — R1011 Right upper quadrant pain: Secondary | ICD-10-CM | POA: Insufficient documentation

## 2015-07-16 DIAGNOSIS — E1140 Type 2 diabetes mellitus with diabetic neuropathy, unspecified: Secondary | ICD-10-CM | POA: Diagnosis not present

## 2015-07-16 DIAGNOSIS — R140 Abdominal distension (gaseous): Secondary | ICD-10-CM | POA: Insufficient documentation

## 2015-07-16 DIAGNOSIS — G8929 Other chronic pain: Secondary | ICD-10-CM | POA: Diagnosis not present

## 2015-07-16 DIAGNOSIS — M1990 Unspecified osteoarthritis, unspecified site: Secondary | ICD-10-CM | POA: Insufficient documentation

## 2015-07-16 DIAGNOSIS — Z9981 Dependence on supplemental oxygen: Secondary | ICD-10-CM | POA: Diagnosis not present

## 2015-07-16 DIAGNOSIS — Z8659 Personal history of other mental and behavioral disorders: Secondary | ICD-10-CM | POA: Insufficient documentation

## 2015-07-16 DIAGNOSIS — Z8701 Personal history of pneumonia (recurrent): Secondary | ICD-10-CM | POA: Insufficient documentation

## 2015-07-16 DIAGNOSIS — Z79899 Other long term (current) drug therapy: Secondary | ICD-10-CM | POA: Diagnosis not present

## 2015-07-16 DIAGNOSIS — Z8719 Personal history of other diseases of the digestive system: Secondary | ICD-10-CM | POA: Diagnosis not present

## 2015-07-16 DIAGNOSIS — I252 Old myocardial infarction: Secondary | ICD-10-CM | POA: Diagnosis not present

## 2015-07-16 DIAGNOSIS — I10 Essential (primary) hypertension: Secondary | ICD-10-CM | POA: Diagnosis not present

## 2015-07-16 DIAGNOSIS — J45909 Unspecified asthma, uncomplicated: Secondary | ICD-10-CM | POA: Diagnosis not present

## 2015-07-16 LAB — COMPREHENSIVE METABOLIC PANEL
ALT: 24 U/L (ref 17–63)
ANION GAP: 10 (ref 5–15)
AST: 30 U/L (ref 15–41)
Albumin: 3.5 g/dL (ref 3.5–5.0)
Alkaline Phosphatase: 73 U/L (ref 38–126)
BILIRUBIN TOTAL: 1.2 mg/dL (ref 0.3–1.2)
BUN: 16 mg/dL (ref 6–20)
CHLORIDE: 100 mmol/L — AB (ref 101–111)
CO2: 27 mmol/L (ref 22–32)
Calcium: 8.7 mg/dL — ABNORMAL LOW (ref 8.9–10.3)
Creatinine, Ser: 1.12 mg/dL (ref 0.61–1.24)
GFR calc Af Amer: >60 mL/min (ref >60)
GFR calc non Af Amer: >60 mL/min (ref >60)
GLUCOSE: 139 mg/dL — AB (ref 65–99)
POTASSIUM: 3.6 mmol/L (ref 3.5–5.1)
Sodium: 137 mmol/L (ref 135–145)
TOTAL PROTEIN: 7.6 g/dL (ref 6.5–8.1)

## 2015-07-16 LAB — URINALYSIS, ROUTINE W REFLEX MICROSCOPIC
BILIRUBIN URINE: NEGATIVE
Glucose, UA: NEGATIVE mg/dL
Hgb urine dipstick: NEGATIVE
KETONES UR: NEGATIVE mg/dL
LEUKOCYTES UA: NEGATIVE
NITRITE: NEGATIVE
PH: 5.5 (ref 5.0–8.0)
PROTEIN: NEGATIVE mg/dL
Specific Gravity, Urine: 1.017 (ref 1.005–1.030)
UROBILINOGEN UA: 0.2 mg/dL (ref 0.0–1.0)

## 2015-07-16 LAB — LIPASE, BLOOD: Lipase: 26 U/L (ref 22–51)

## 2015-07-16 LAB — CBC
HCT: 42.9 % (ref 39.0–52.0)
Hemoglobin: 14.3 g/dL (ref 13.0–17.0)
MCH: 30 pg (ref 26.0–34.0)
MCHC: 33.3 g/dL (ref 30.0–36.0)
MCV: 90.1 fL (ref 78.0–100.0)
Platelets: 254 10*3/uL (ref 150–400)
RBC: 4.76 MIL/uL (ref 4.22–5.81)
RDW: 14 % (ref 11.5–15.5)
WBC: 6.6 10*3/uL (ref 4.0–10.5)

## 2015-07-16 MED ORDER — SODIUM CHLORIDE 0.9 % IV BOLUS (SEPSIS)
1000.0000 mL | Freq: Once | INTRAVENOUS | Status: AC
  Administered 2015-07-16: 1000 mL via INTRAVENOUS

## 2015-07-16 MED ORDER — OXYCODONE-ACETAMINOPHEN 5-325 MG PO TABS: 1.0000 | ORAL_TABLET | Freq: Four times a day (QID) | ORAL | Status: DC | PRN

## 2015-07-16 MED ORDER — FENTANYL CITRATE (PF) 100 MCG/2ML IJ SOLN
50.0000 ug | Freq: Once | INTRAMUSCULAR | Status: AC
  Administered 2015-07-16: 50 ug via INTRAVENOUS
  Filled 2015-07-16: qty 2

## 2015-07-16 MED ORDER — SIMETHICONE 40 MG/0.6ML PO SUSP (UNIT DOSE)
40.0000 mg | Freq: Once | ORAL | Status: AC
  Administered 2015-07-16: 40 mg via ORAL
  Filled 2015-07-16: qty 0.6

## 2015-07-16 MED ORDER — OXYCODONE-ACETAMINOPHEN 5-325 MG PO TABS
1.0000 | ORAL_TABLET | Freq: Once | ORAL | Status: AC
  Administered 2015-07-16: 1 via ORAL
  Filled 2015-07-16: qty 1

## 2015-07-16 NOTE — ED Notes (Signed)
Pt with abdominal pain x 3 days.  Pt states feels like when his gallbladder acted up x in March.  Symptoms had improved and gallbladder was not removed.  Pt states pain to rlq with belching.  Nausea with no vomiting.  Some diarrhea.  No fever.

## 2015-07-16 NOTE — Telephone Encounter (Signed)
noted 

## 2015-07-16 NOTE — ED Provider Notes (Signed)
CSN: [id number]     Arrival date & time 07/16/15  1325 History   First MD Initiated Contact with Patient 07/16/15 1523     Chief Complaint  Patient presents with  . Abdominal Pain     (Consider location/radiation/quality/duration/timing/severity/associated sxs/prior Treatment) HPI   Patient is a 54 year old male morbid obesity congestive heart failure diverticulosis atrial flutter and recent diagnosis of gallstones presenting today with right upper quadrant pain. Patient was diagnosed with gallstones and scheduled for surgery however he canceled surgery because his wife needed to have surgery in her back. He is now having recurrence of symptoms. They're not made worse by eating. They're made worse by moving. He is gassy, no fevers.   He would like to attend his birthday celebration in [place] on Thursday. So he came here and get this checked out before then.  Past Medical History  Diagnosis Date  . Hypertension   . Congestive heart failure     No echo data available. Myocardial perfusion 2005 with EF 61%, presumed diastolic HF   . Diverticulosis     with history of diverticulitis in 10/2011  . Gout   . Major depression   . Anxiety   . Psychosexual dysfunction with inhibited sexual excitement   . Hyperlipidemia   . Atrial flutter   . Morbid obesity   . Chronic bronchitis     "get it q yr" (01/02/2015)  . Shortness of breath     "all the time" (07/26/2013)  . Chronic back pain   . GERD (gastroesophageal reflux disease)   . Asthma   . Myocardial infarction     "one doctor said I did back in 2014"  . Pneumonia 2013; 2014  . OSA (obstructive sleep apnea) 1998    "wear my CPAP a couple times/month" (01/02/2015)  . Diabetic peripheral neuropathy   . Type II diabetes mellitus dx'd 1991    previously on insulin in 2000 for 3-4 years (but was stopped because adamantly did not want to be on insulin)  . Arthritis     involving knees, ankles, back, elbows (01/02/2015)  . Gout    Past  Surgical History  Procedure Laterality Date  . Ankle reconstruction Right 1990's    2/2 trauma sustained after fall  . 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 Cyst removal leg Left 1960's    back of  leg  . Tee without cardioversion N/A 07/28/2013    Procedure: TRANSESOPHAGEAL ECHOCARDIOGRAM (TEE);  Surgeon: Laurey Morale, MD;  Location: [facility] ENDOSCOPY;  Service: Cardiovascular;  Laterality: N/A;  . Cardioversion N/A 07/28/2013    Procedure: CARDIOVERSION;  Surgeon: Laurey Morale, MD;  Location: [facility] ENDOSCOPY;  Service: Cardiovascular;  Laterality: N/A;  . Cardioversion N/A 10/26/2013    Procedure: CARDIOVERSION;  Surgeon: Cassell Clement, MD;  Location: [facility] ENDOSCOPY;  Service: Cardiovascular;  Laterality: N/A;  . Left heart catheterization with coronary angiogram N/A 07/27/2013    Procedure: LEFT HEART CATHETERIZATION WITH CORONARY ANGIOGRAM;  Surgeon: Peter M Swaziland, MD;  Location: [facility] CATH LAB;  Service: Cardiovascular;  Laterality: N/A;  . Cardiac catheterization  1990's; 07/2013  . Finger amputation     Family History  Problem Relation Age of Onset  . Diabetes Mother   . CAD Mother 67    requiring quadruple bypass  . Hypertension Mother   . Alzheimer's disease Mother   . Stomach cancer Father   . Lung cancer Father     was a smoker  .  Gout Father   . CAD Brother 48    requiring quadruple bypass  . Seizures Brother   . Hypertension Brother   . Diabetes Maternal Grandmother   . Alzheimer's disease Maternal Grandmother   . Breast cancer Paternal Aunt   . Diabetes Maternal Aunt   . Seizures Paternal Uncle    Social History  Substance Use Topics  . Smoking status: Current Some Day Smoker -- 0.12 packs/day for 10 years    Types: Cigarettes  . Smokeless tobacco: Never Used     Comment: rarely.  . Alcohol Use: 0.0 oz/week    0 Standard drinks or equivalent per week     Comment: Rarely.    Review of Systems  Constitutional: Negative for fever and activity change.  HENT:  Negative for drooling and hearing loss.   Eyes: Negative for discharge and redness.  Respiratory: Negative for cough and shortness of breath.   Cardiovascular: Negative for chest pain.  Gastrointestinal: Positive for abdominal pain and abdominal distention. Negative for nausea, vomiting and diarrhea.  Genitourinary: Negative for dysuria and urgency.  Musculoskeletal: Negative for arthralgias.  Allergic/Immunologic: Negative for immunocompromised state.  Neurological: Negative for seizures and speech difficulty.  Psychiatric/Behavioral: Negative for behavioral problems and agitation.  All other systems reviewed and are negative.     Allergies  Eggs or egg-derived products and Sulfa antibiotics  Home Medications   Prior to Admission medications   Medication Sig Start Date End Date Taking? Authorizing Provider  albuterol (PROAIR HFA) 108 (90 BASE) MCG/ACT inhaler Inhale 2 puffs into the lungs every 4 (four) hours as needed for wheezing or shortness of breath. Inhale 1-2 puff every 4-6 hours 05/30/15  Yes Kirt Boys, DO  calcium carbonate (TUMS - DOSED IN MG ELEMENTAL CALCIUM) 500 MG chewable tablet Chew 1 tablet by mouth as needed for indigestion or heartburn.   Yes Historical Provider, MD  carvedilol (COREG) 6.25 MG tablet Take 1 tablet (6.25 mg total) by mouth 2 (two) times daily. 06/19/15  Yes Cassell Clement, MD  colchicine 0.6 MG tablet Take one tablet by mouth once daily for gout prevention 07/03/15  Yes Kirt Boys, DO  diclofenac sodium (VOLTAREN) 1 % GEL Apply 2 g topically 4 (four) times daily.   Yes Historical Provider, MD  gabapentin (NEURONTIN) 600 MG tablet Take 600mg  in the am & 1200mg  in the pm for pains 07/02/15  Yes Kirt Boys, DO  HYDROcodone-acetaminophen (NORCO/VICODIN) 5-325 MG per tablet Take 1-2 tablets by mouth every 6 (six) hours as needed for moderate pain. 07/03/15  Yes Sharon Seller, NP  losartan (COZAAR) 25 MG tablet Take 1 tablet (25 mg total) by mouth  daily. 05/04/15  Yes Cassell Clement, MD  metFORMIN (GLUCOPHAGE) 1000 MG tablet Take 1 tablet (1,000 mg total) by mouth daily with breakfast. 07/28/14 07/28/15 Yes Carolan Clines, MD  sildenafil (VIAGRA) 100 MG tablet Take 100 mg by mouth daily as needed for erectile dysfunction.   Yes Historical Provider, MD  torsemide (DEMADEX) 20 MG tablet Take 2 tablets (40 mg total) by mouth 2 (two) times daily. 01/29/15  Yes Cassell Clement, MD  XARELTO 20 MG TABS tablet TAKE 1 TABLET (20 MG TOTAL) BY MOUTH DAILY WITH SUPPER. Patient taking differently: Take 1 tablet (20 mg total) by mouth daily with supper. 05/21/15  Yes Cassell Clement, MD  levalbuterol [facility] HFA) 45 MCG/ACT inhaler Inhale 1-2 puffs into the lungs every 4 (four) hours as needed for wheezing. Patient not taking: Reported on 07/16/2015 07/31/13  Roger A Arguello, PA-C  ondansetron (ZOFRAN ODT) 8 MG disintegrating tablet Take 1 tablet (8 mg total) by mouth every 8 (eight) hours as needed for nausea or vomiting. Patient not taking: Reported on 07/16/2015 02/06/15   Azalia Bilis, MD  oxyCODONE-acetaminophen (PERCOCET/ROXICET) 5-325 MG per tablet Take 1 tablet by mouth every 6 (six) hours as needed for severe pain. 07/16/15   Danyelle Brookover Lyn Geniece Akers, MD  spironolactone (ALDACTONE) 25 MG tablet TAKE 1/2 TABLET (12.5 MG TOTAL) BY MOUTH DAILY. Patient not taking: Reported on 07/16/2015 11/01/14   Bevelyn Buckles Bensimhon, MD   BP 131/86 mmHg  Pulse 107  Temp(Src) 98.2 F (36.8 C) (Oral)  Resp 20  Ht  (1.702 m)  Wt 279 lb (126.554 kg)  BMI 43.69 kg/m2  SpO2 99% Physical Exam  Constitutional: He is oriented to person, place, and time. He appears well-nourished.  Morbid obesity  HENT:  Head: Normocephalic.  Mouth/Throat: Oropharynx is clear and moist.  Eyes: Conjunctivae are normal.  Neck: No tracheal deviation present.  Cardiovascular: Normal rate.   Pulmonary/Chest: Effort normal. No stridor. No respiratory distress.  Abdominal: Soft. He exhibits  distension. There is tenderness.  Tenderness to flank and back and right upper quadrant.  Musculoskeletal: Normal range of motion. He exhibits no edema.  Neurological: He is oriented to person, place, and time. No cranial nerve deficit.  Skin: Skin is warm and dry. No rash noted. He is not diaphoretic.  Psychiatric: He has a normal mood and affect. His behavior is normal.  Nursing note and vitals reviewed.   ED Course  Procedures (including critical care time) Labs Review Labs Reviewed  COMPREHENSIVE METABOLIC PANEL - Abnormal; Notable for the following:    Chloride 100 (*)    Glucose, Bld 139 (*)    Calcium 8.7 (*)    All other components within normal limits  URINALYSIS, ROUTINE W REFLEX MICROSCOPIC (NOT AT [facility]) - Abnormal; Notable for the following:    APPearance CLOUDY (*)    All other components within normal limits  URINE CULTURE  LIPASE, BLOOD  CBC    Imaging Review US Abdomen Limited Ruq  07/16/2015   CLINICAL DATA:  RIGHT upper quadrant pain for 3 days, history hypertension, CHF, asthma, diabetes mellitus, MI  EXAM: US ABDOMEN LIMITED - RIGHT UPPER QUADRANT  COMPARISON:  02/05/2015  FINDINGS: Degradation of image quality secondary to body habitus.  Gallbladder:  Multiple shadowing gallstones fill gallbladder, largest discrete stone measured at 15 mm diameter. No sonographic Murphy sign or pericholecystic fluid. Upper normal gallbladder wall thickness.  Common bile duct:  Diameter: Suboptimally visualized, question 5 mm diameter  Liver:  Suboptimal assessment of intrahepatic detail due to body habitus. No gross hepatic mass or nodularity. Hepatopetal portal venous flow.  No RIGHT upper quadrant free fluid.  IMPRESSION: Cholelithiasis without definite evidence of acute cholecystitis.  Limitations of exam secondary to body habitus.   Electronically Signed   By: Ulyses Southward M.D.   On: 07/16/2015 17:07   I have personally reviewed and evaluated these images and lab results as part  of my medical decision-making.   EKG Interpretation None      MDM   Final diagnoses:  RUQ pain    Patient is a 54 year old male with morbid obesity and multiple health comorbidities presenting today with right upper quadrant tenderness similar to the last time he was diagnosed with gallstones. Story is not consistent with gallbladder disease, however he states it felt same as it did before. We  will get right upper quadrant ultrasound at this time. No fever. We'll assess for pancreatitis as well. The pain has been going on for 3 days and associated with gas and burping therefore do not suspect cardiac disease. No chest pain.   Patient asking to take PO, will give him diet precautions. Gallstones present, he will follow up wth his own surgeon.   Brigetta Beckstrom Randall An, MD 07/16/15 2324

## 2015-07-16 NOTE — Telephone Encounter (Signed)
Patient called and stated that he is having a gallbladder flare up and does have Gallstones. Stated that he is scheduled to have surgery in November. Patient has been seeing Dr. Abbey Chatters for this and will give his office a call. Agreed.

## 2015-07-16 NOTE — Discharge Instructions (Signed)
You need to get your gallbladder removed at some point. Follow up with your surgeonas already planned. Return if paingets worse or you have fever.   Biliary Colic  Biliary colic is a steady or irregular pain in the upper abdomen. It is usually under the right side of the rib cage. It happens when gallstones interfere with the normal flow of bile from the gallbladder. Bile is a liquid that helps to digest fats. Bile is made in the liver and stored in the gallbladder. When you eat a meal, bile passes from the gallbladder through the cystic duct and the common bile duct into the small intestine. There, it mixes with partially digested food. If a gallstone blocks either of these ducts, the normal flow of bile is blocked. The muscle cells in the bile duct contract forcefully to try to move the stone. This causes the pain of biliary colic.  SYMPTOMS   A person with biliary colic usually complains of pain in the upper abdomen. This pain can be:  In the center of the upper abdomen just below the breastbone.  In the upper-right part of the abdomen, near the gallbladder and liver.  Spread back toward the right shoulder blade.  Nausea and vomiting.  The pain usually occurs after eating.  Biliary colic is usually triggered by the digestive system's demand for bile. The demand for bile is high after fatty meals. Symptoms can also occur when a person who has been fasting suddenly eats a very large meal. Most episodes of biliary colic pass after 1 to 5 hours. After the most intense pain passes, your abdomen may continue to ache mildly for about 24 hours. DIAGNOSIS  After you describe your symptoms, your caregiver will perform a physical exam. He or she will pay attention to the upper right portion of your belly (abdomen). This is the area of your liver and gallbladder. An ultrasound will help your caregiver look for gallstones. Specialized scans of the gallbladder may also be done. Blood tests may be done,  especially if you have fever or if your pain persists. PREVENTION  Biliary colic can be prevented by controlling the risk factors for gallstones. Some of these risk factors, such as heredity, increasing age, and pregnancy are a normal part of life. Obesity and a high-fat diet are risk factors you can change through a healthy lifestyle. Women going through menopause who take hormone replacement therapy (estrogen) are also more likely to develop biliary colic. TREATMENT   Pain medication may be prescribed.  You may be encouraged to eat a fat-free diet.  If the first episode of biliary colic is severe, or episodes of colic keep retuning, surgery to remove the gallbladder (cholecystectomy) is usually recommended. This procedure can be done through small incisions using an instrument called a laparoscope. The procedure often requires a brief stay in the hospital. Some people can leave the hospital the same day. It is the most widely used treatment in people troubled by painful gallstones. It is effective and safe, with no complications in more than 90% of cases.  If surgery cannot be done, medication that dissolves gallstones may be used. This medication is expensive and can take months or years to work. Only small stones will dissolve.  Rarely, medication to dissolve gallstones is combined with a procedure called shock-wave lithotripsy. This procedure uses carefully aimed shock waves to break up gallstones. In many people treated with this procedure, gallstones form again within a few years. PROGNOSIS  If gallstones block  your cystic duct or common bile duct, you are at risk for repeated episodes of biliary colic. There is also a 25% chance that you will develop a gallbladder infection(acute cholecystitis), or some other complication of gallstones within 10 to 20 years. If you have surgery, schedule it at a time that is convenient for you and at a time when you are not sick. HOME CARE INSTRUCTIONS    Drink plenty of clear fluids.  Avoid fatty, greasy or fried foods, or any foods that make your pain worse.  Take medications as directed. SEEK MEDICAL CARE IF:   You develop a fever over 100.5 F (38.1 C).  Your pain gets worse over time.  You develop nausea that prevents you from eating and drinking.  You develop vomiting. SEEK IMMEDIATE MEDICAL CARE IF:   You have continuous or severe belly (abdominal) pain which is not relieved with medications.  You develop nausea and vomiting which is not relieved with medications.  You have symptoms of biliary colic and you suddenly develop a fever and shaking chills. This may signal cholecystitis. Call your caregiver immediately.  You develop a yellow color to your skin or the white part of your eyes (jaundice). Document Released: 04/13/2006 Document Revised: 02/02/2012 Document Reviewed: 06/22/2008 [facility] Patient Information 2015 [place], [place]. This information is not intended to replace advice given to you by your health care provider. Make sure you discuss any questions you have with your health care provider.

## 2015-07-18 ENCOUNTER — Ambulatory Visit: Payer: PPO | Admitting: Internal Medicine

## 2015-07-18 LAB — URINE CULTURE: CULTURE: NO GROWTH

## 2015-07-23 ENCOUNTER — Emergency Department (HOSPITAL_COMMUNITY): Payer: PPO

## 2015-07-23 ENCOUNTER — Other Ambulatory Visit: Payer: Self-pay | Admitting: Cardiology

## 2015-07-23 ENCOUNTER — Encounter (HOSPITAL_COMMUNITY): Payer: Self-pay | Admitting: Emergency Medicine

## 2015-07-23 ENCOUNTER — Telehealth: Payer: Self-pay | Admitting: *Deleted

## 2015-07-23 ENCOUNTER — Telehealth: Payer: Self-pay | Admitting: Physician Assistant

## 2015-07-23 ENCOUNTER — Emergency Department (HOSPITAL_COMMUNITY)
Admission: EM | Admit: 2015-07-23 | Discharge: 2015-07-23 | Disposition: A | Discharge status: Home / Self Care | Payer: PPO | Attending: Emergency Medicine | Admitting: Emergency Medicine

## 2015-07-23 DIAGNOSIS — E1140 Type 2 diabetes mellitus with diabetic neuropathy, unspecified: Secondary | ICD-10-CM | POA: Insufficient documentation

## 2015-07-23 DIAGNOSIS — R0602 Shortness of breath: Secondary | ICD-10-CM

## 2015-07-23 DIAGNOSIS — M1990 Unspecified osteoarthritis, unspecified site: Secondary | ICD-10-CM | POA: Insufficient documentation

## 2015-07-23 DIAGNOSIS — G8929 Other chronic pain: Secondary | ICD-10-CM | POA: Insufficient documentation

## 2015-07-23 DIAGNOSIS — F419 Anxiety disorder, unspecified: Secondary | ICD-10-CM | POA: Diagnosis not present

## 2015-07-23 DIAGNOSIS — I5023 Acute on chronic systolic (congestive) heart failure: Secondary | ICD-10-CM

## 2015-07-23 DIAGNOSIS — I252 Old myocardial infarction: Secondary | ICD-10-CM | POA: Insufficient documentation

## 2015-07-23 DIAGNOSIS — I10 Essential (primary) hypertension: Secondary | ICD-10-CM | POA: Diagnosis not present

## 2015-07-23 DIAGNOSIS — Z79899 Other long term (current) drug therapy: Secondary | ICD-10-CM | POA: Insufficient documentation

## 2015-07-23 DIAGNOSIS — F329 Major depressive disorder, single episode, unspecified: Secondary | ICD-10-CM | POA: Insufficient documentation

## 2015-07-23 DIAGNOSIS — Z720 Tobacco use: Secondary | ICD-10-CM | POA: Diagnosis not present

## 2015-07-23 DIAGNOSIS — R2243 Localized swelling, mass and lump, lower limb, bilateral: Secondary | ICD-10-CM | POA: Insufficient documentation

## 2015-07-23 DIAGNOSIS — M109 Gout, unspecified: Secondary | ICD-10-CM | POA: Diagnosis not present

## 2015-07-23 DIAGNOSIS — E785 Hyperlipidemia, unspecified: Secondary | ICD-10-CM | POA: Insufficient documentation

## 2015-07-23 DIAGNOSIS — R600 Localized edema: Secondary | ICD-10-CM

## 2015-07-23 DIAGNOSIS — J45901 Unspecified asthma with (acute) exacerbation: Secondary | ICD-10-CM | POA: Diagnosis not present

## 2015-07-23 LAB — I-STAT TROPONIN, ED: Troponin i, poc: 0.04 ng/mL (ref 0.00–0.08)

## 2015-07-23 LAB — BASIC METABOLIC PANEL
ANION GAP: 10 (ref 5–15)
BUN: 22 mg/dL — ABNORMAL HIGH (ref 6–20)
CO2: 27 mmol/L (ref 22–32)
Calcium: 8.9 mg/dL (ref 8.9–10.3)
Chloride: 102 mmol/L (ref 101–111)
Creatinine, Ser: 1.31 mg/dL — ABNORMAL HIGH (ref 0.61–1.24)
GFR calc Af Amer: >60 mL/min (ref >60)
GFR, EST NON AFRICAN AMERICAN: 59 mL/min — AB (ref >60)
GLUCOSE: 127 mg/dL — AB (ref 65–99)
POTASSIUM: 3.4 mmol/L — AB (ref 3.5–5.1)
Sodium: 139 mmol/L (ref 135–145)

## 2015-07-23 LAB — CBC WITH DIFFERENTIAL/PLATELET
BASOS ABS: 0 10*3/uL (ref 0.0–0.1)
Basophils Relative: 1 % (ref 0–1)
Eosinophils Absolute: 0.1 10*3/uL (ref 0.0–0.7)
Eosinophils Relative: 2 % (ref 0–5)
HEMATOCRIT: 39.7 % (ref 39.0–52.0)
HEMOGLOBIN: 13.3 g/dL (ref 13.0–17.0)
LYMPHS PCT: 21 % (ref 12–46)
Lymphs Abs: 1.3 10*3/uL (ref 0.7–4.0)
MCH: 29.8 pg (ref 26.0–34.0)
MCHC: 33.5 g/dL (ref 30.0–36.0)
MCV: 88.8 fL (ref 78.0–100.0)
MONO ABS: 0.4 10*3/uL (ref 0.1–1.0)
Monocytes Relative: 6 % (ref 3–12)
NEUTROS ABS: 4.4 10*3/uL (ref 1.7–7.7)
NEUTROS PCT: 70 % (ref 43–77)
Platelets: 307 10*3/uL (ref 150–400)
RBC: 4.47 MIL/uL (ref 4.22–5.81)
RDW: 13.9 % (ref 11.5–15.5)
WBC: 6.3 10*3/uL (ref 4.0–10.5)

## 2015-07-23 LAB — BRAIN NATRIURETIC PEPTIDE: B NATRIURETIC PEPTIDE 5: 239.8 pg/mL — AB (ref 0.0–100.0)

## 2015-07-23 MED ORDER — METOLAZONE 2.5 MG PO TABS: 2.5000 mg | ORAL_TABLET | Freq: Every day | ORAL | Status: DC

## 2015-07-23 MED ORDER — FUROSEMIDE 10 MG/ML IJ SOLN
40.0000 mg | Freq: Once | INTRAMUSCULAR | Status: AC
  Administered 2015-07-23: 40 mg via INTRAVENOUS
  Filled 2015-07-23: qty 4

## 2015-07-23 NOTE — ED Provider Notes (Signed)
CSN: [id number]     Arrival date & time 07/23/15  1357 History   First MD Initiated Contact with Patient 07/23/15 1608     Chief Complaint  Patient presents with  . Shortness of Breath  . Leg Swelling     (Consider location/radiation/quality/duration/timing/severity/associated sxs/prior Treatment) HPI Comments: Jeremiah Perkins is a 54 y.o. male with a PMHx of HTN, CHF, diverticulosis, gout, depression/anxiety, Aflutter s/p cardioversion on xarelto, COPD, chronic SOB, GERD, ?MI, OSA on CPAP, DM2, and arthritis, who presents to the ED with complaints of gradual onset bilateral lower extremity swelling and shortness of breath 5 days. Shortness of breath is at rest, but worsens with activity. No relief with his home torsemide 40mg  BID. Associated symptoms include mild chest tightness, dry cough, wheezing, and 4 pillow orthopnea up from 1 pillow. Additionally he reports weight gain, but he is unsure of how much he has gained. He denies any fevers, chills, hemoptysis, chest pain, recent travel/surgery/immobilization, history of DVT/PE, abdominal pain, nausea, vomiting, diarrhea, constipation, dysuria, hematuria, numbness, tingling, weakness, or claudication. He is a smoker. His cardiologist is Dr. Patty Sermons  Patient is a 54 y.o. male presenting with shortness of breath. The history is provided by the patient. No language interpreter was used.  Shortness of Breath Severity:  Moderate Onset quality:  Gradual Duration:  4 days Timing:  Constant Progression:  Worsening Chronicity:  Recurrent Relieved by:  Nothing Worsened by:  Activity Ineffective treatments:  Diuretics Associated symptoms: cough (dry) and wheezing   Associated symptoms: no abdominal pain, no chest pain, no claudication, no fever, no hemoptysis, no sputum production and no vomiting   Associated symptoms comment:  +4 pillow orthopnea up from 1 pillow Risk factors: obesity and tobacco use   Risk factors: no hx of PE/DVT, no  prolonged immobilization and no recent surgery     Past Medical History  Diagnosis Date  . Hypertension   . Congestive heart failure     No echo data available. Myocardial perfusion 2005 with EF 61%, presumed diastolic HF   . Diverticulosis     with history of diverticulitis in 10/2011  . Gout   . Major depression   . Anxiety   . Psychosexual dysfunction with inhibited sexual excitement   . Hyperlipidemia   . Atrial flutter   . Morbid obesity   . Chronic bronchitis     "get it q yr" (01/02/2015)  . Shortness of breath     "all the time" (07/26/2013)  . Chronic back pain   . GERD (gastroesophageal reflux disease)   . Asthma   . Myocardial infarction     "one doctor said I did back in 2014"  . Pneumonia 2013; 2014  . OSA (obstructive sleep apnea) 1998    "wear my CPAP a couple times/month" (01/02/2015)  . Diabetic peripheral neuropathy   . Type II diabetes mellitus dx'd 1991    previously on insulin in 2000 for 3-4 years (but was stopped because adamantly did not want to be on insulin)  . Arthritis     involving knees, ankles, back, elbows (01/02/2015)  . Gout    Past Surgical History  Procedure Laterality Date  . Ankle reconstruction Right 1990's    2/2 trauma sustained after fall  . 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 Cyst removal leg Left 1960's    back of  leg  . Tee without cardioversion N/A 07/28/2013    Procedure: TRANSESOPHAGEAL ECHOCARDIOGRAM (TEE);  Surgeon: Freida Busman  Alford Highland, MD;  Location: [facility] ENDOSCOPY;  Service: Cardiovascular;  Laterality: N/A;  . Cardioversion N/A 07/28/2013    Procedure: CARDIOVERSION;  Surgeon: Laurey Morale, MD;  Location: [facility] ENDOSCOPY;  Service: Cardiovascular;  Laterality: N/A;  . Cardioversion N/A 10/26/2013    Procedure: CARDIOVERSION;  Surgeon: Cassell Clement, MD;  Location: [facility] ENDOSCOPY;  Service: Cardiovascular;  Laterality: N/A;  . Left heart catheterization with coronary angiogram N/A 07/27/2013    Procedure: LEFT HEART  CATHETERIZATION WITH CORONARY ANGIOGRAM;  Surgeon: Peter M Swaziland, MD;  Location: [facility] CATH LAB;  Service: Cardiovascular;  Laterality: N/A;  . Cardiac catheterization  1990's; 07/2013  . Finger amputation     Family History  Problem Relation Age of Onset  . Diabetes Mother   . CAD Mother 14    requiring quadruple bypass  . Hypertension Mother   . Alzheimer's disease Mother   . Stomach cancer Father   . Lung cancer Father     was a smoker  . Gout Father   . CAD Brother 48    requiring quadruple bypass  . Seizures Brother   . Hypertension Brother   . Diabetes Maternal Grandmother   . Alzheimer's disease Maternal Grandmother   . Breast cancer Paternal Aunt   . Diabetes Maternal Aunt   . Seizures Paternal Uncle    Social History  Substance Use Topics  . Smoking status: Current Some Day Smoker -- 0.12 packs/day for 10 years    Types: Cigarettes  . Smokeless tobacco: Never Used     Comment: rarely.  . Alcohol Use: 0.0 oz/week    0 Standard drinks or equivalent per week     Comment: Rarely.    Review of Systems  Constitutional: Positive for unexpected weight change (wt gain, unsure how much). Negative for fever and chills.  Respiratory: Positive for cough (dry), chest tightness, shortness of breath and wheezing. Negative for hemoptysis and sputum production.   Cardiovascular: Positive for leg swelling. Negative for chest pain and claudication.  Gastrointestinal: Negative for nausea, vomiting, abdominal pain, diarrhea and constipation.  Genitourinary: Negative for dysuria and hematuria.  Musculoskeletal: Negative for myalgias and arthralgias.  Skin: Negative for color change.  Allergic/Immunologic: Positive for immunocompromised state (diabetic).  Neurological: Negative for weakness and numbness.  Hematological: Bruises/bleeds easily (on xarelto).  Psychiatric/Behavioral: Negative for confusion.   10 Systems reviewed and are negative for acute change except as noted in the HPI.     Allergies  Eggs or egg-derived products and Sulfa antibiotics  Home Medications   Prior to Admission medications   Medication Sig Start Date End Date Taking? Authorizing Provider  albuterol (PROAIR HFA) 108 (90 BASE) MCG/ACT inhaler Inhale 2 puffs into the lungs every 4 (four) hours as needed for wheezing or shortness of breath. Inhale 1-2 puff every 4-6 hours 05/30/15   Kirt Boys, DO  calcium carbonate (TUMS - DOSED IN MG ELEMENTAL CALCIUM) 500 MG chewable tablet Chew 1 tablet by mouth as needed for indigestion or heartburn.    Historical Provider, MD  carvedilol (COREG) 6.25 MG tablet Take 1 tablet (6.25 mg total) by mouth 2 (two) times daily. 06/19/15   Cassell Clement, MD  colchicine 0.6 MG tablet Take one tablet by mouth once daily for gout prevention 07/03/15   Kirt Boys, DO  diclofenac sodium (VOLTAREN) 1 % GEL Apply 2 g topically 4 (four) times daily.    Historical Provider, MD  gabapentin (NEURONTIN) 600 MG tablet Take 600mg  in the am & 1200mg  in  the pm for pains 07/02/15   Kirt Boys, DO  HYDROcodone-acetaminophen (NORCO/VICODIN) 5-325 MG per tablet Take 1-2 tablets by mouth every 6 (six) hours as needed for moderate pain. 07/03/15   Sharon Seller, NP  levalbuterol [facility] HFA) 45 MCG/ACT inhaler Inhale 1-2 puffs into the lungs every 4 (four) hours as needed for wheezing. Patient not taking: Reported on 07/16/2015 07/31/13   Roger A Arguello, PA-C  losartan (COZAAR) 25 MG tablet Take 1 tablet (25 mg total) by mouth daily. 05/04/15   Cassell Clement, MD  metFORMIN (GLUCOPHAGE) 1000 MG tablet Take 1 tablet (1,000 mg total) by mouth daily with breakfast. 07/28/14 07/28/15  Carolan Clines, MD  ondansetron (ZOFRAN ODT) 8 MG disintegrating tablet Take 1 tablet (8 mg total) by mouth every 8 (eight) hours as needed for nausea or vomiting. Patient not taking: Reported on 07/16/2015 02/06/15   Azalia Bilis, MD  oxyCODONE-acetaminophen (PERCOCET/ROXICET) 5-325 MG per tablet Take 1 tablet by  mouth every 6 (six) hours as needed for severe pain. 07/16/15   Courteney Lyn Mackuen, MD  sildenafil (VIAGRA) 100 MG tablet Take 100 mg by mouth daily as needed for erectile dysfunction.    Historical Provider, MD  spironolactone (ALDACTONE) 25 MG tablet TAKE 1/2 TABLET (12.5 MG TOTAL) BY MOUTH DAILY. Patient not taking: Reported on 07/16/2015 11/01/14   Dolores Patty, MD  torsemide (DEMADEX) 20 MG tablet Take 2 tablets (40 mg total) by mouth 2 (two) times daily. 01/29/15   Cassell Clement, MD  XARELTO 20 MG TABS tablet TAKE 1 TABLET (20 MG TOTAL) BY MOUTH DAILY WITH SUPPER. Patient taking differently: Take 1 tablet (20 mg total) by mouth daily with supper. 05/21/15   Cassell Clement, MD   BP 127/81 mmHg  Pulse 94  Temp(Src) 97.3 F (36.3 C) (Oral)  Resp 20  Ht 5\' 7"  (1.702 m)  Wt 283 lb 3.2 oz (128.459 kg)  BMI 44.35 kg/m2  SpO2 99% Physical Exam  Constitutional: He is oriented to person, place, and time. Vital signs are normal. He appears well-developed and well-nourished.  Non-toxic appearance. He appears distressed (appears uncomfortable, sitting fully upright).  Afebrile, nontoxic, somewhat uncomfortable, sitting fully upright  HENT:  Head: Normocephalic and atraumatic.  Mouth/Throat: Oropharynx is clear and moist and mucous membranes are normal.  Eyes: Conjunctivae and EOM are normal. Right eye exhibits no discharge. Left eye exhibits no discharge.  Neck: Normal range of motion. Neck supple.  Cardiovascular: Normal rate, regular rhythm, normal heart sounds and intact distal pulses.  Exam reveals no gallop and no friction rub.   No murmur heard. RRR, nl s1/s2, no m/r/g, distal pulses intact, 2+ b/l pedal edema up to knee  Pulmonary/Chest: Effort normal. No respiratory distress. He has no decreased breath sounds. He has no wheezes. He has no rhonchi. He has rales.  Bibasilar rales in lower fields bilaterally, no wheezing or rhonchi, no hypoxia, slightly increased WOB with pt  panting to get full deep respirations but no retractions noted, speaking in full sentences, SpO2 99% on RA   Abdominal: Soft. Normal appearance and bowel sounds are normal. He exhibits no distension. There is no tenderness. There is no rigidity, no rebound and no guarding.  Musculoskeletal: Normal range of motion.  MAE x4 Strength and sensation grossly intact Distal pulses intact 2+ b/l pedal edema  Neurological: He is alert and oriented to person, place, and time. He has normal strength. No sensory deficit.  Skin: Skin is warm, dry and intact. No rash  noted.  Psychiatric: He has a normal mood and affect.  Nursing note and vitals reviewed.   ED Course  Procedures (including critical care time) Labs Review Labs Reviewed  BASIC METABOLIC PANEL - Abnormal; Notable for the following:    Potassium 3.4 (*)    Glucose, Bld 127 (*)    BUN 22 (*)    Creatinine, Ser 1.31 (*)    GFR calc non Af Amer 59 (*)    All other components within normal limits  BRAIN NATRIURETIC PEPTIDE - Abnormal; Notable for the following:    B Natriuretic Peptide 239.8 (*)    All other components within normal limits  CBC WITH DIFFERENTIAL/PLATELET  Rosezena Sensor, ED    Imaging Review Dg Chest 2 View  07/23/2015   CLINICAL DATA:  Chest pain for 3 days.  EXAM: CHEST  2 VIEW  COMPARISON:  02/05/2015 an 12/24/2014  FINDINGS: Stable mild enlargement of the cardiac silhouette. Prominent interstitial lung markings are similar to the prior examinations. The trachea is midline. No focal airspace disease and no pleural effusions. Bridging osteophytes in the thoracic spine.  IMPRESSION: Stable enlargement of the cardiac silhouette with chronic interstitial prominence.  No focal airspace disease and no definite pulmonary edema.   Electronically Signed   By: Richarda Overlie M.D.   On: 07/23/2015 15:18   I have personally reviewed and evaluated these images and lab results as part of my medical decision-making.  01/04/15  Echo: Study Conclusions - Left ventricle: Poor image quality unable to estimate EF likely at least mildly decreased Suggest MRI or other modality to asses. The cavity size was mildly dilated. Wall thickness was increased in a pattern of mild LVH. - Left atrium: The atrium was moderately dilated.  Transthoracic echocardiography. M-mode, complete 2D, spectral Doppler, and color Doppler. Birthdate: Patient birthdate: 11-05-1961. Age: Patient is 54 yr old. Sex: Gender: male. BMI: 43.9 kg/m^2. Blood pressure:   137/67 Patient status: Inpatient. Study date: Study date: 01/04/2015. Study time: 02:16 PM.   07/27/13 LHC:  Final Conclusions:  1. Normal coronary anatomy. 2. Severe LV dysfunction EF 20-25%  Recommendations: Medical management.  Peter Swaziland MD,FACC 07/27/2013, 1:14 PM   EKG Interpretation   Date/Time:  Monday July 23 2015 14:04:20 EDT Ventricular Rate:  81 PR Interval:    QRS Duration: 90 QT Interval:  428 QTC Calculation: 497 R Axis:   56 Text Interpretation:  Atrial fibrillation with premature ventricular or  aberrantly conducted complexes Low voltage QRS Cannot rule out Anterior  infarct , age undetermined Abnormal ECG Atrial flutter, no ischemic  changes Confirmed by LITTLE MD, RACHEL [phone]) on 07/23/2015 6:22:11 PM      MDM   Final diagnoses:  Acute on chronic systolic congestive heart failure  Shortness of breath  Pedal edema    54 y.o. male here with SOB and chest tightness and BLE swelling despite home torsemide. 2+ pitting edema bilaterally up to the knee, slight bibasilar rales in lower fields bilaterally. Chart review notable for 4lb weight gain since 07/16/15. EKG unchanged from prior. Trop neg. BMP with mildly low K at 3.4, mild AKI with BUN 22, Cr 1.31. CBC w/diff unremarkable. CXR with stable cardiomegaly and prominent interstitial lung markings, no pulm edema. Will get BNP and give 40mg  lasix once. Will reassess shortly.   6:19  PM BNP close to baseline. Mild improvement in symptoms, pt urinating ~500cc since being given lasix. Currently appears more comfortable, laying nearly flat. Will consult cardiology to discuss home management  vs admission.   6:26 PM Dr. Delton See returning page, they stated that pt could be started on Metolazone 2.5mg  once before morning torsemide dose, and they will schedule him an appt for Friday and call him with that appt time. Pt agrees with this plan. I explained the diagnosis and have given explicit precautions to return to the ER including for any other new or worsening symptoms. The patient understands and accepts the medical plan as it's been dictated and I have answered their questions. Discharge instructions concerning home care and prescriptions have been given. The patient is STABLE and is discharged to home in good condition.  BP 136/81 mmHg  Pulse 96  Temp(Src) 97.3 F (36.3 C) (Oral)  Resp 13  Ht  (1.702 m)  Wt 283 lb 3.2 oz (128.459 kg)  BMI 44.35 kg/m2  SpO2 98%  Meds ordered this encounter  Medications  . furosemide (LASIX) injection 40 mg    Sig:   . metolazone (ZAROXOLYN) 2.5 MG tablet    Sig: Take 1 tablet (2.5 mg total) by mouth daily. Take 30 minutes prior to morning Torsemide dose    Dispense:  5 tablet    Refill:  0    Order Specific Question:  Supervising Provider    Answer:  Eber Hong [[id number]]     Omkar Stratmann Camprubi-Soms, PA-C 07/23/15 1833  Laurence Spates, MD 07/23/15 [phone]

## 2015-07-23 NOTE — Telephone Encounter (Signed)
Received message from Dr. Delton See that she consulted with EDP over the phone about this patient and he needs an appointment in [facility] scheduled. Patient of [place]. Please call patient to arrange. Thank you! Dayna Dunn PA-C

## 2015-07-23 NOTE — Discharge Instructions (Signed)
Take Metolazone prior to your morning torsemide dose every morning. Your cardiologist's office will call you to advise you of your appointment time on Friday 07/27/15. Return to the ER for changes or worsening symptoms.   Shortness of Breath Shortness of breath means you have trouble breathing. Shortness of breath needs medical care right away. HOME CARE   Do not smoke.  Avoid being around chemicals or things (paint fumes, dust) that may bother your breathing.  Rest as needed. Slowly begin your normal activities.  Only take medicines as told by your doctor.  Keep all doctor visits as told. GET HELP RIGHT AWAY IF:   Your shortness of breath gets worse.  You feel lightheaded, pass out (faint), or have a cough that is not helped by medicine.  You cough up blood.  You have pain with breathing.  You have pain in your chest, arms, shoulders, or belly (abdomen).  You have a fever.  You cannot walk up stairs or exercise the way you normally do.  You do not get better in the time expected.  You have a hard time doing normal activities even with rest.  You have problems with your medicines.  You have any new symptoms. MAKE SURE YOU:  Understand these instructions.  Will watch your condition.  Will get help right away if you are not doing well or get worse. Document Released: 04/28/2008 Document Revised: 11/15/2013 Document Reviewed: 01/26/2012 [facility] Patient Information 2015 [place], [place]. This information is not intended to replace advice given to you by your health care provider. Make sure you discuss any questions you have with your health care provider.  Heart Failure Heart failure means your heart has trouble pumping blood. This makes it hard for your body to work well. Heart failure is usually a long-term (chronic) condition. You must take good care of yourself and follow your doctor's treatment plan. HOME CARE  Take your heart medicine as told by your  doctor.  Do not stop taking medicine unless your doctor tells you to.  Do not skip any dose of medicine.  Refill your medicines before they run out.  Take other medicines only as told by your doctor or pharmacist.  Stay active if told by your doctor. The elderly and people with severe heart failure should talk with a doctor about physical activity.  Eat heart-healthy foods. Choose foods that are without trans fat and are low in saturated fat, cholesterol, and salt (sodium). This includes fresh or frozen fruits and vegetables, fish, lean meats, fat-free or low-fat dairy foods, whole grains, and high-fiber foods. Lentils and dried peas and beans (legumes) are also good choices.  Limit salt if told by your doctor.  Cook in a healthy way. Roast, grill, broil, bake, poach, steam, or stir-fry foods.  Limit fluids as told by your doctor.  Weigh yourself every morning. Do this after you pee (urinate) and before you eat breakfast. Write down your weight to give to your doctor.  Take your blood pressure and write it down if your doctor tells you to.  Ask your doctor how to check your pulse. Check your pulse as told.  Lose weight if told by your doctor.  Stop smoking or chewing tobacco. Do not use gum or patches that help you quit without your doctor's approval.  Schedule and go to doctor visits as told.  Nonpregnant women should have no more than 1 drink a day. Men should have no more than 2 drinks a day. Talk to  your doctor about drinking alcohol.  Stop illegal drug use.  Stay current with shots (immunizations).  Manage your health conditions as told by your doctor.  Learn to manage your stress.  Rest when you are tired.  If it is really hot outside:  Avoid intense activities.  Use air conditioning or fans, or get in a cooler place.  Avoid caffeine and alcohol.  Wear loose-fitting, lightweight, and light-colored clothing.  If it is really cold outside:  Avoid intense  activities.  Layer your clothing.  Wear mittens or gloves, a hat, and a scarf when going outside.  Avoid alcohol.  Learn about heart failure and get support as needed.  Get help to maintain or improve your quality of life and your ability to care for yourself as needed. GET HELP IF:   You gain 03 lb/1.4 kg or more in 1 day or 05 lb/2.3 kg in a week.  You are more short of breath than usual.  You cannot do your normal activities.  You tire easily.  You cough more than normal, especially with activity.  You have any or more puffiness (swelling) in areas such as your hands, feet, ankles, or belly (abdomen).  You cannot sleep because it is hard to breathe.  You feel like your heart is beating fast (palpitations).  You get dizzy or light-headed when you stand up. GET HELP RIGHT AWAY IF:   You have trouble breathing.  There is a change in mental status, such as becoming less alert or not being able to focus.  You have chest pain or discomfort.  You faint. MAKE SURE YOU:   Understand these instructions.  Will watch your condition.  Will get help right away if you are not doing well or get worse. Document Released: 08/19/2008 Document Revised: 03/27/2014 Document Reviewed: 12/27/2012 [facility] Patient Information 2015 [place], [place]. This information is not intended to replace advice given to you by your health care provider. Make sure you discuss any questions you have with your health care provider.  Edema Edema is an abnormal buildup of fluids in your bodytissues. Edema is somewhatdependent on gravity to pull the fluid to the lowest place in your body. That makes the condition more common in the legs and thighs (lower extremities). Painless swelling of the feet and ankles is common and becomes more likely as you get older. It is also common in looser tissues, like around your eyes.  When the affected area is squeezed, the fluid may move out of that spot and leave a  dent for a few moments. This dent is called pitting.  CAUSES  There are many possible causes of edema. Eating too much salt and being on your feet or sitting for a long time can cause edema in your legs and ankles. Hot weather may make edema worse. Common medical causes of edema include:  Heart failure.  Liver disease.  Kidney disease.  Weak blood vessels in your legs.  Cancer.  An injury.  Pregnancy.  Some medications.  Obesity. SYMPTOMS  Edema is usually painless.Your skin may look swollen or shiny.  DIAGNOSIS  Your health care provider may be able to diagnose edema by asking about your medical history and doing a physical exam. You may need to have tests such as X-rays, an electrocardiogram, or blood tests to check for medical conditions that may cause edema.  TREATMENT  Edema treatment depends on the cause. If you have heart, liver, or kidney disease, you need the treatment appropriate for  these conditions. General treatment may include:  Elevation of the affected body part above the level of your heart.  Compression of the affected body part. Pressure from elastic bandages or support stockings squeezes the tissues and forces fluid back into the blood vessels. This keeps fluid from entering the tissues.  Restriction of fluid and salt intake.  Use of a water pill (diuretic). These medications are appropriate only for some types of edema. They pull fluid out of your body and make you urinate more often. This gets rid of fluid and reduces swelling, but diuretics can have side effects. Only use diuretics as directed by your health care provider. HOME CARE INSTRUCTIONS   Keep the affected body part above the level of your heart when you are lying down.   Do not sit still or stand for prolonged periods.   Do not put anything directly under your knees when lying down.  Do not wear constricting clothing or garters on your upper legs.   Exercise your legs to work the  fluid back into your blood vessels. This may help the swelling go down.   Wear elastic bandages or support stockings to reduce ankle swelling as directed by your health care provider.   Eat a low-salt diet to reduce fluid if your health care provider recommends it.   Only take medicines as directed by your health care provider. SEEK MEDICAL CARE IF:   Your edema is not responding to treatment.  You have heart, liver, or kidney disease and notice symptoms of edema.  You have edema in your legs that does not improve after elevating them.   You have sudden and unexplained weight gain. SEEK IMMEDIATE MEDICAL CARE IF:   You develop shortness of breath or chest pain.   You cannot breathe when you lie down.  You develop pain, redness, or warmth in the swollen areas.   You have heart, liver, or kidney disease and suddenly get edema.  You have a fever and your symptoms suddenly get worse. MAKE SURE YOU:   Understand these instructions.  Will watch your condition.  Will get help right away if you are not doing well or get worse. Document Released: 11/10/2005 Document Revised: 03/27/2014 Document Reviewed: 09/02/2013 [facility] Patient Information 2015 [place], [place]. This information is not intended to replace advice given to you by your health care provider. Make sure you discuss any questions you have with your health care provider.

## 2015-07-23 NOTE — Telephone Encounter (Signed)
Patient called and stated that he just got back from vacation and he is retaining water, has a history of CHF, hasn't slept in 2 days and it is very hard to catch his breath. I instructed him to go ahead and go to ER to be evaluated and he stated that when he gets like this they have in the past placed him on Lasix IV. Patient agreed and will go.

## 2015-07-23 NOTE — ED Notes (Signed)
Pt sts increased SOB and leg swelling x 4 days; pt sts taking diuretics without output

## 2015-07-25 ENCOUNTER — Ambulatory Visit (INDEPENDENT_AMBULATORY_CARE_PROVIDER_SITE_OTHER): Payer: PPO | Admitting: Internal Medicine

## 2015-07-25 ENCOUNTER — Encounter: Payer: Self-pay | Admitting: Internal Medicine

## 2015-07-25 VITALS — BP 92/76 | HR 73 | Temp 97.9°F | Resp 20 | Ht 67.0 in | Wt 271.0 lb

## 2015-07-25 DIAGNOSIS — I4892 Unspecified atrial flutter: Secondary | ICD-10-CM | POA: Diagnosis not present

## 2015-07-25 DIAGNOSIS — K8020 Calculus of gallbladder without cholecystitis without obstruction: Secondary | ICD-10-CM

## 2015-07-25 DIAGNOSIS — I9589 Other hypotension: Secondary | ICD-10-CM

## 2015-07-25 DIAGNOSIS — I5022 Chronic systolic (congestive) heart failure: Secondary | ICD-10-CM | POA: Diagnosis not present

## 2015-07-25 DIAGNOSIS — R1011 Right upper quadrant pain: Secondary | ICD-10-CM | POA: Diagnosis not present

## 2015-07-25 DIAGNOSIS — I959 Hypotension, unspecified: Secondary | ICD-10-CM | POA: Insufficient documentation

## 2015-07-25 DIAGNOSIS — Z7901 Long term (current) use of anticoagulants: Secondary | ICD-10-CM | POA: Diagnosis not present

## 2015-07-25 DIAGNOSIS — E118 Type 2 diabetes mellitus with unspecified complications: Secondary | ICD-10-CM | POA: Diagnosis not present

## 2015-07-25 MED ORDER — METOLAZONE 2.5 MG PO TABS: ORAL_TABLET | ORAL | Status: DC

## 2015-07-25 MED ORDER — LEVALBUTEROL TARTRATE 45 MCG/ACT IN AERO: 1.0000 | INHALATION_SPRAY | RESPIRATORY_TRACT | Status: DC | PRN

## 2015-07-25 MED ORDER — OXYCODONE-ACETAMINOPHEN 5-325 MG PO TABS: 1.0000 | ORAL_TABLET | ORAL | Status: DC | PRN

## 2015-07-25 NOTE — Progress Notes (Signed)
Patient ID: Jeremiah Perkins, male   DOB: [birth year], 54 y.o.   MRN: [id number]    Location:    [facility]   Place of Service:  OFFICE   Chief Complaint  Patient presents with  . Acute Visit    Retaining fluid in feet have swelling    HPI:  54 yo male seen today for f/u after ER visit. He went to [facility] about 1 week ago for RUQ pain. US revealed cholelithiasis but no cystitis. He then presented to ED 2 days ago with LE swelling. He was given IV lasix and po zaroxolyn to take prior to other diuretic. Swelling is improved. He went [place] vacation to the beach on [place]th and experienced exacerbation of gallbladder. He saw sx this AM and needs cardiac clearance which is scheduled for next week. He was in aflutter at ED and takes xeralto.  In the meantime, he needs better pain control. Current pain radiates to right shoulder blade. (+) nausea but no emesis and appetite reduced.   DM - last A1c 6.7%. He does not check BS at home.he takes metformin 1061m daily.  Edema - on torsemide BID and recently added zaroxolyn daily    Past Medical History  Diagnosis Date  . Hypertension   . Congestive heart failure     No echo data available. Myocardial perfusion 2005 with EF 61%, presumed diastolic HF   . Diverticulosis     with history of diverticulitis in 10/2011  . Gout   . Major depression   . Anxiety   . Psychosexual dysfunction with inhibited sexual excitement   . Hyperlipidemia   . Atrial flutter   . Morbid obesity   . Chronic bronchitis     "get it q yr" (01/02/2015)  . Shortness of breath     "all the time" (07/26/2013)  . Chronic back pain   . GERD (gastroesophageal reflux disease)   . Asthma   . Myocardial infarction     "one doctor said I did back in 2014"  . Pneumonia 2013; 2014  . OSA (obstructive sleep apnea) 1998    "wear my CPAP a couple times/month" (01/02/2015)  . Diabetic peripheral neuropathy   . Type II diabetes mellitus dx'd 1991    previously on insulin in 2000 for 3-4  years (but was stopped because adamantly did not want to be on insulin)  . Arthritis     involving knees, ankles, back, elbows (01/02/2015)  . Gout     Past Surgical History  Procedure Laterality Date  . Ankle reconstruction Right 1990's    2/2 trauma sustained after fall  . 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 Cyst removal leg Left 1960's    back of  leg  . Tee without cardioversion N/A 07/28/2013    Procedure: TRANSESOPHAGEAL ECHOCARDIOGRAM (TEE);  Surgeon: DLarey Dresser MD;  [facility]  Service: Cardiovascular;  Laterality: N/A;  . Cardioversion N/A 07/28/2013    Procedure: CARDIOVERSION;  Surgeon: DLarey Dresser MD;  [place] [facility]  Service: Cardiovascular;  Laterality: N/A;  . Cardioversion N/A 10/26/2013    Procedure: CARDIOVERSION;  Surgeon: TDarlin Coco MD;  [facility] [facility]  Service: Cardiovascular;  Laterality: N/A;  . Left heart catheterization with coronary angiogram N/A 07/27/2013    Procedure: LEFT HEART CATHETERIZATION WITH CORONARY ANGIOGRAM;  Surgeon: Peter M JMartinique MD;  [facility];  Service: Cardiovascular;  Laterality: N/A;  . Cardiac catheterization  1990's; 07/2013  .  Finger amputation      Patient Care Team: [name], DO as PCP - General (Internal Medicine) Bartholomew Crews, MD as Referring Physician (Internal Medicine) Tobi Bastos, RN as Registered Nurse [name], MD as Consulting Physician (Cardiology)  Social History   Social History  . Marital Status: Married    Spouse Name: N/A  . Number of Children: 3  . Years of Education: 14   Occupational History  . Now unemployed     Conservation officer, nature at [employer]  .  [employer]   Social History Main Topics  . Smoking status: Current Some Day Smoker -- 0.12 packs/day for 10 years    Types: Cigarettes  . Smokeless tobacco: Never Used     Comment: rarely.  . Alcohol Use: 0.0 oz/week    0 Standard drinks or equivalent per week     Comment: Rarely.   . Drug Use: No  . Sexual Activity: Not on file   Other Topics Concern  . Not on file   Social History Narrative   Previously worked as Conservation officer, nature and lives at home with his wife. They each have children but none between the two of them.      Diet:      Do you drink/ eat things with caffeine?  Yes      Marital status:   Married                             What year were you married ? 2010      Do you live in a house, apartment,assistred living, condo, trailer, etc.)? House      Is it one or more stories? Yes      How many persons live in your home ? 3      Do you have any pets in your home ?(please list) Dog      Current or past profession: Aircraft Mechanic/Instructor      Do you exercise?  Yes                            Type & how often:  Golf,cut grass, walk      Do you have a living will? No      Do you have a DNR form?   No                    If not, do you want to discuss one?       Do you have signed POA?HPOA forms?   No              If so, please bring to your        appointment           reports that he has been smoking Cigarettes.  He has a 1.2 pack-year smoking history. He has never used smokeless tobacco. He reports that he drinks alcohol. He reports that he does not use illicit drugs.  Allergies  Allergen Reactions  . Eggs Or Egg-Derived Products Nausea And Vomiting    Did ok with flu shot, seems to be an issue with the type of preparation of egg product.  . Sulfa Antibiotics Nausea And Vomiting    Medications: Patient's Medications  New Prescriptions   No medications on file  Previous Medications   ALBUTEROL (PROAIR HFA) 108 (90 BASE) MCG/ACT INHALER    Inhale 2  puffs into the lungs every 4 (four) hours as needed for wheezing or shortness of breath. Inhale 1-2 puff every 4-6 hours   CALCIUM CARBONATE (TUMS - DOSED IN MG ELEMENTAL CALCIUM) 500 MG CHEWABLE TABLET    Chew 1 tablet by mouth as needed for indigestion or heartburn.   CARVEDILOL  (COREG) 6.25 MG TABLET    Take 1 tablet (6.25 mg total) by mouth 2 (two) times daily.   COLCHICINE 0.6 MG TABLET    Take one tablet by mouth once daily for gout prevention   DICLOFENAC SODIUM (VOLTAREN) 1 % GEL    Apply 2 g topically 4 (four) times daily.   GABAPENTIN (NEURONTIN) 600 MG TABLET    Take 642m in the am & 1204min the pm for pains   HYDROCODONE-ACETAMINOPHEN (NORCO/VICODIN) 5-325 MG PER TABLET    Take 1-2 tablets by mouth every 6 (six) hours as needed for moderate pain.   LEVALBUTEROL (XOPENEX HFA) 45 MCG/ACT INHALER    Inhale 1-2 puffs into the lungs every 4 (four) hours as needed for wheezing.   LOSARTAN (COZAAR) 25 MG TABLET    Take 1 tablet (25 mg total) by mouth daily.   METFORMIN (GLUCOPHAGE) 1000 MG TABLET    Take 1 tablet (1,000 mg total) by mouth daily with breakfast.   METOLAZONE (ZAROXOLYN) 2.5 MG TABLET    Take 1 tablet (2.5 mg total) by mouth daily. Take 30 minutes prior to morning Torsemide dose   ONDANSETRON (ZOFRAN ODT) 8 MG DISINTEGRATING TABLET    Take 1 tablet (8 mg total) by mouth every 8 (eight) hours as needed for nausea or vomiting.   OXYCODONE-ACETAMINOPHEN (PERCOCET/ROXICET) 5-325 MG PER TABLET    Take 1 tablet by mouth every 6 (six) hours as needed for severe pain.   RIVAROXABAN (XARELTO) 20 MG TABS TABLET    Take 20 mg by mouth every morning.   SILDENAFIL (VIAGRA) 100 MG TABLET    Take 100 mg by mouth daily as needed for erectile dysfunction.   SPIRONOLACTONE (ALDACTONE) 25 MG TABLET    TAKE 1/2 TABLET (12.5 MG TOTAL) BY MOUTH DAILY.   TORSEMIDE (DEMADEX) 20 MG TABLET    TAKE 2 TABLETS (40 MG TOTAL) BY MOUTH 2 (TWO) TIMES DAILY.   XARELTO 20 MG TABS TABLET    TAKE 1 TABLET (20 MG TOTAL) BY MOUTH DAILY WITH SUPPER.  Modified Medications   No medications on file  Discontinued Medications   No medications on file    Review of Systems  Constitutional: Positive for chills, appetite change and fatigue. Negative for fever and activity change.  HENT: Negative  for sore throat and trouble swallowing.   Eyes: Negative for visual disturbance.  Respiratory: Negative for cough, chest tightness and shortness of breath.   Cardiovascular: Positive for leg swelling. Negative for chest pain and palpitations.  Gastrointestinal: Positive for nausea, abdominal pain and diarrhea (stools loose). Negative for vomiting and blood in stool.  Genitourinary: Negative for urgency, frequency and difficulty urinating.  Musculoskeletal: Positive for back pain and arthralgias. Negative for gait problem.  Skin: Negative for rash.  Neurological: Negative for weakness and headaches.  Psychiatric/Behavioral: Negative for confusion and sleep disturbance. The patient is not nervous/anxious.     Filed Vitals:   07/25/15 1602  BP: 92/76  Pulse: 73  Temp: 97.9 F (36.6 C)  TempSrc: Oral  Resp: 20  Height: _0  (1.702 m)  Weight: 271 lb (122.925 kg)  SpO2: 98%   Body mass index is  42.43 kg/(m^2).  Physical Exam  Constitutional: He is oriented to person, place, and time. He appears well-developed. He appears ill.  HENT:  Mouth/Throat: Oropharynx is clear and moist. No oropharyngeal exudate.  MMM  Eyes: Pupils are equal, round, and reactive to light. No scleral icterus.  Neck: No JVD present.  Cardiovascular: Regular rhythm, intact distal pulses and normal pulses.  Exam reveals no S4.   Murmur heard.  Systolic murmur is present with a grade of 1/6  +1 pitting LE edema b/l. No calf TTP  Pulmonary/Chest: Effort normal and breath sounds normal. No respiratory distress. He has no wheezes. He has no rales. He exhibits no tenderness.  Abdominal: Soft. Bowel sounds are normal. He exhibits distension. There is no hepatomegaly. There is tenderness in the right upper quadrant. There is positive Murphy's sign. There is no rebound, no guarding and no CVA tenderness.  Musculoskeletal: He exhibits edema and tenderness.       Thoracic back: He exhibits tenderness and spasm.        Back:  Lymphadenopathy:    He has no cervical adenopathy.  Neurological: He is alert and oriented to person, place, and time.  Skin: Skin is warm and dry. There is erythema.  Psychiatric: He has a normal mood and affect. His behavior is normal. Thought content normal.     Labs reviewed: Admission on 07/23/2015, Discharged on 07/23/2015  Component Date Value Ref Range Status  . Sodium 07/23/2015 139  135 - 145 mmol/L Final  . Potassium 07/23/2015 3.4* 3.5 - 5.1 mmol/L Final  . Chloride 07/23/2015 102  101 - 111 mmol/L Final  . CO2 07/23/2015 27  22 - 32 mmol/L Final  . Glucose, Bld 07/23/2015 127* 65 - 99 mg/dL Final  . BUN 07/23/2015 22* 6 - 20 mg/dL Final  . Creatinine, Ser 07/23/2015 1.31* 0.61 - 1.24 mg/dL Final  . Calcium 07/23/2015 8.9  8.9 - 10.3 mg/dL Final  . GFR calc non Af Amer 07/23/2015 59* >60 mL/min Final  . GFR calc Af Amer 07/23/2015 >60  >60 mL/min Final   Comment: (NOTE) The eGFR has been calculated using the CKD EPI equation. This calculation has not been validated in all clinical situations. eGFR's persistently <60 mL/min signify possible Chronic Kidney Disease.   . Anion gap 07/23/2015 10  5 - 15 Final  . WBC 07/23/2015 6.3  4.0 - 10.5 K/uL Final  . RBC 07/23/2015 4.47  4.22 - 5.81 MIL/uL Final  . Hemoglobin 07/23/2015 13.3  13.0 - 17.0 g/dL Final  . HCT 07/23/2015 39.7  39.0 - 52.0 % Final  . MCV 07/23/2015 88.8  78.0 - 100.0 fL Final  . MCH 07/23/2015 29.8  26.0 - 34.0 pg Final  . MCHC 07/23/2015 33.5  30.0 - 36.0 g/dL Final  . RDW 07/23/2015 13.9  11.5 - 15.5 % Final  . Platelets 07/23/2015 307  150 - 400 K/uL Final  . Neutrophils Relative % 07/23/2015 70  43 - 77 % Final  . Neutro Abs 07/23/2015 4.4  1.7 - 7.7 K/uL Final  . Lymphocytes Relative 07/23/2015 21  12 - 46 % Final  . Lymphs Abs 07/23/2015 1.3  0.7 - 4.0 K/uL Final  . Monocytes Relative 07/23/2015 6  3 - 12 % Final  . Monocytes Absolute 07/23/2015 0.4  0.1 - 1.0 K/uL Final  . Eosinophils  Relative 07/23/2015 2  0 - 5 % Final  . Eosinophils Absolute 07/23/2015 0.1  0.0 - 0.7 K/uL Final  .  Basophils Relative 07/23/2015 1  0 - 1 % Final  . Basophils Absolute 07/23/2015 0.0  0.0 - 0.1 K/uL Final  . Troponin i, poc 07/23/2015 0.04  0.00 - 0.08 ng/mL Final  . Comment 3 07/23/2015          Final   Comment: Due to the release kinetics of cTnI, a negative result within the first hours of the onset of symptoms does not rule out myocardial infarction with certainty. If myocardial infarction is still suspected, repeat the test at appropriate intervals.   . B Natriuretic Peptide 07/23/2015 239.8* 0.0 - 100.0 pg/mL Final  Admission on 07/16/2015, Discharged on 07/16/2015  Component Date Value Ref Range Status  . Lipase 07/16/2015 26  22 - 51 U/L Final  . Sodium 07/16/2015 137  135 - 145 mmol/L Final  . Potassium 07/16/2015 3.6  3.5 - 5.1 mmol/L Final  . Chloride 07/16/2015 100* 101 - 111 mmol/L Final  . CO2 07/16/2015 27  22 - 32 mmol/L Final  . Glucose, Bld 07/16/2015 139* 65 - 99 mg/dL Final  . BUN 07/16/2015 16  6 - 20 mg/dL Final  . Creatinine, Ser 07/16/2015 1.12  0.61 - 1.24 mg/dL Final  . Calcium 07/16/2015 8.7* 8.9 - 10.3 mg/dL Final  . Total Protein 07/16/2015 7.6  6.5 - 8.1 g/dL Final  . Albumin 07/16/2015 3.5  3.5 - 5.0 g/dL Final  . AST 07/16/2015 30  15 - 41 U/L Final  . ALT 07/16/2015 24  17 - 63 U/L Final  . Alkaline Phosphatase 07/16/2015 73  38 - 126 U/L Final  . Total Bilirubin 07/16/2015 1.2  0.3 - 1.2 mg/dL Final  . GFR calc non Af Amer 07/16/2015 >60  >60 mL/min Final  . GFR calc Af Amer 07/16/2015 >60  >60 mL/min Final   Comment: (NOTE) The eGFR has been calculated using the CKD EPI equation. This calculation has not been validated in all clinical situations. eGFR's persistently <60 mL/min signify possible Chronic Kidney Disease.   . Anion gap 07/16/2015 10  5 - 15 Final  . WBC 07/16/2015 6.6  4.0 - 10.5 K/uL Final  . RBC 07/16/2015 4.76  4.22 - 5.81  MIL/uL Final  . Hemoglobin 07/16/2015 14.3  13.0 - 17.0 g/dL Final  . HCT 07/16/2015 42.9  39.0 - 52.0 % Final  . MCV 07/16/2015 90.1  78.0 - 100.0 fL Final  . MCH 07/16/2015 30.0  26.0 - 34.0 pg Final  . MCHC 07/16/2015 33.3  30.0 - 36.0 g/dL Final  . RDW 07/16/2015 14.0  11.5 - 15.5 % Final  . Platelets 07/16/2015 254  150 - 400 K/uL Final  . Color, Urine 07/16/2015 YELLOW  YELLOW Final  . APPearance 07/16/2015 CLOUDY* CLEAR Final  . Specific Gravity, Urine 07/16/2015 1.017  1.005 - 1.030 Final  . pH 07/16/2015 5.5  5.0 - 8.0 Final  . Glucose, UA 07/16/2015 NEGATIVE  NEGATIVE mg/dL Final  . Hgb urine dipstick 07/16/2015 NEGATIVE  NEGATIVE Final  . Bilirubin Urine 07/16/2015 NEGATIVE  NEGATIVE Final  . Ketones, ur 07/16/2015 NEGATIVE  NEGATIVE mg/dL Final  . Protein, ur 07/16/2015 NEGATIVE  NEGATIVE mg/dL Final  . Urobilinogen, UA 07/16/2015 0.2  0.0 - 1.0 mg/dL Final  . Nitrite 07/16/2015 NEGATIVE  NEGATIVE Final  . Leukocytes, UA 07/16/2015 NEGATIVE  NEGATIVE Final   MICROSCOPIC NOT DONE ON URINES WITH NEGATIVE PROTEIN, BLOOD, LEUKOCYTES, NITRITE, OR GLUCOSE <1000 mg/dL.  Marland Kitchen Specimen Description 07/16/2015 URINE, CLEAN CATCH   Final  .  Special Requests 07/16/2015 NONE   Final  . Culture 07/16/2015    Final                   Value:NO GROWTH 2 DAYS Performed at [facility]   . Report Status 07/16/2015 07/18/2015 FINAL   Final  Office Visit on 05/30/2015  Component Date Value Ref Range Status  . Glucose 05/30/2015 150* 65 - 99 mg/dL Final   Comment: Specimen received in contact with cells. No visible hemolysis present. However GLUC may be decreased and K increased. Clinical correlation indicated.   . BUN 05/30/2015 23  6 - 24 mg/dL Final  . Creatinine, Ser 05/30/2015 1.28* 0.76 - 1.27 mg/dL Final  . GFR calc non Af Amer 05/30/2015 63  >59 mL/min/1.73 Final  . GFR calc Af Amer 05/30/2015 72  >59 mL/min/1.73 Final  . BUN/Creatinine Ratio 05/30/2015 18  9 - 20 Final  .  Sodium 05/30/2015 143  134 - 144 mmol/L Final  . Potassium 05/30/2015 4.2  3.5 - 5.2 mmol/L Final  . Chloride 05/30/2015 96* 97 - 108 mmol/L Final  . CO2 05/30/2015 25  18 - 29 mmol/L Final  . Calcium 05/30/2015 9.1  8.7 - 10.2 mg/dL Final  . Total Protein 05/30/2015 7.2  6.0 - 8.5 g/dL Final  . Albumin 05/30/2015 4.0  3.5 - 5.5 g/dL Final  . Globulin, Total 05/30/2015 3.2  1.5 - 4.5 g/dL Final  . Albumin/Globulin Ratio 05/30/2015 1.3  1.1 - 2.5 Final  . Bilirubin Total 05/30/2015 1.0  0.0 - 1.2 mg/dL Final  . Alkaline Phosphatase 05/30/2015 96  39 - 117 IU/L Final  . AST 05/30/2015 25  0 - 40 IU/L Final  . ALT 05/30/2015 21  0 - 44 IU/L Final  . Hgb A1c MFr Bld 05/30/2015 6.7* 4.8 - 5.6 % Final   Comment:          Pre-diabetes: 5.7 - 6.4          Diabetes: >6.4          Glycemic control for adults with diabetes: <7.0   . Est. average glucose Bld gHb Est-m* 05/30/2015 146   Final  . Cholesterol, Total 05/30/2015 160  100 - 199 mg/dL Final  . Triglycerides 05/30/2015 158* 0 - 149 mg/dL Final  . HDL 05/30/2015 40  >39 mg/dL Final   Comment: According to ATP-III Guidelines, HDL-C >59 mg/dL is considered a negative risk factor for CHD.   Marland Kitchen VLDL Cholesterol Cal 05/30/2015 32  5 - 40 mg/dL Final  . LDL Calculated 05/30/2015 88  0 - 99 mg/dL Final  . Chol/HDL Ratio 05/30/2015 4.0  0.0 - 5.0 ratio units Final   Comment:                                   T. Chol/HDL Ratio                                             Men  Women                               1/2 Avg.Risk  3.4    3.3  Avg.Risk  5.0    4.4                                2X Avg.Risk  9.6    7.1                                3X Avg.Risk 23.4   11.0   Lab on 05/11/2015  Component Date Value Ref Range Status  . Sodium 05/11/2015 134* 135 - 145 mEq/L Final  . Potassium 05/11/2015 4.1  3.5 - 5.1 mEq/L Final  . Chloride 05/11/2015 100  96 - 112 mEq/L Final  . CO2 05/11/2015 27  19 - 32 mEq/L Final   . Glucose, Bld 05/11/2015 170* 70 - 99 mg/dL Final  . BUN 05/11/2015 20  6 - 23 mg/dL Final  . Creatinine, Ser 05/11/2015 1.17  0.40 - 1.50 mg/dL Final  . Calcium 05/11/2015 9.3  8.4 - 10.5 mg/dL Final  . GFR 05/11/2015 82.99  >60.00 mL/min Final  Lab on 05/03/2015  Component Date Value Ref Range Status  . Sodium 05/03/2015 135  135 - 145 mEq/L Final  . Potassium 05/03/2015 3.4* 3.5 - 5.1 mEq/L Final  . Chloride 05/03/2015 102  96 - 112 mEq/L Final  . CO2 05/03/2015 24  19 - 32 mEq/L Final  . Glucose, Bld 05/03/2015 196* 70 - 99 mg/dL Final  . BUN 05/03/2015 20  6 - 23 mg/dL Final  . Creatinine, Ser 05/03/2015 1.22  0.40 - 1.50 mg/dL Final  . Calcium 05/03/2015 8.9  8.4 - 10.5 mg/dL Final  . GFR 05/03/2015 79.08  >60.00 mL/min Final  Lab on 04/27/2015  Component Date Value Ref Range Status  . Sodium 04/27/2015 135  135 - 145 mEq/L Final  . Potassium 04/27/2015 3.4* 3.5 - 5.1 mEq/L Final  . Chloride 04/27/2015 98  96 - 112 mEq/L Final  . CO2 04/27/2015 27  19 - 32 mEq/L Final  . Glucose, Bld 04/27/2015 209* 70 - 99 mg/dL Final  . BUN 04/27/2015 32* 6 - 23 mg/dL Final  . Creatinine, Ser 04/27/2015 1.64* 0.40 - 1.50 mg/dL Final  . Calcium 04/27/2015 9.1  8.4 - 10.5 mg/dL Final  . GFR 04/27/2015 56.21* >60.00 mL/min Final    Dg Chest 2 View  07/23/2015   CLINICAL DATA:  Chest pain for 3 days.  EXAM: CHEST  2 VIEW  COMPARISON:  02/05/2015 an 12/24/2014  FINDINGS: Stable mild enlargement of the cardiac silhouette. Prominent interstitial lung markings are similar to the prior examinations. The trachea is midline. No focal airspace disease and no pleural effusions. Bridging osteophytes in the thoracic spine.  IMPRESSION: Stable enlargement of the cardiac silhouette with chronic interstitial prominence.  No focal airspace disease and no definite pulmonary edema.   Electronically Signed   By: Markus Daft M.D.   On: 07/23/2015 15:18   US Abdomen Limited Ruq  07/16/2015   CLINICAL DATA:  RIGHT  upper quadrant pain for 3 days, history hypertension, CHF, asthma, diabetes mellitus, MI  EXAM: US ABDOMEN LIMITED - RIGHT UPPER QUADRANT  COMPARISON:  02/05/2015  FINDINGS: Degradation of image quality secondary to body habitus.  Gallbladder:  Multiple shadowing gallstones fill gallbladder, largest discrete stone measured at 15 mm diameter. No sonographic Murphy sign or pericholecystic fluid. Upper normal gallbladder wall thickness.  Common bile duct:  Diameter: Suboptimally visualized, question  5 mm diameter  Liver:  Suboptimal assessment of intrahepatic detail due to body habitus. No gross hepatic mass or nodularity. Hepatopetal portal venous flow.  No RIGHT upper quadrant free fluid.  IMPRESSION: Cholelithiasis without definite evidence of acute cholecystitis.  Limitations of exam secondary to body habitus.   Electronically Signed   By: Lavonia Dana M.D.   On: 07/16/2015 17:07     Assessment/Plan   ICD-9-CM ICD-10-CM   1. RUQ abdominal pain due to #2 789.01 R10.11 oxyCODONE-acetaminophen (PERCOCET/ROXICET) 5-325 MG per tablet  2. Calculus of gallbladder w/o mention of cholecystitis or obstruction 574.20 K80.20 oxyCODONE-acetaminophen (PERCOCET/ROXICET) 5-325 MG per tablet  3. Diabetes mellitus type 2, controlled, with complications - controlled 250.90 E11.8   4. Long term current use of anticoagulant therapy V58.61 Z79.01   5. Other specified hypotension due to diuresis 458.8 I95.89   6. Chronic systolic heart failure - edema improved 428.22 I50.22   7. Atrial flutter, unspecified - rate controlled 427.32 I48.92    Start checking sugars at home 3 times weekly fasting  Reduce zaroxolyn to 3 times a week (Tuesday, Thursday, Saturday)  Change diet to bland/clear liquids. Avoid foods high in fat or sugar  Reduce metformin to 1/2 tablet daily until gallbladder surgery  Follow up as scheduled  Sterlin Knightly S. Perlie Gold  [facility] [address] [place], [place] [address] [phone] Cell (Monday-Friday 8 AM - 5 PM) [phone] After 5 PM and follow prompts

## 2015-07-25 NOTE — Patient Instructions (Addendum)
Start check sugars at home 3 times weekly fasting  Reduce zaroxolyn to 3 times a week (Tuesday, Thursday, Saturday)  Change diet to bland/clear liquids. Avoid foods high in fat or sugar  Reduce metformin to 1/2 tablet daily until gallbladder surgery  Follow up as scheduled

## 2015-07-26 ENCOUNTER — Telehealth: Payer: Self-pay | Admitting: *Deleted

## 2015-07-26 NOTE — Telephone Encounter (Signed)
Received a fax from [employer] for prior Authorization for Xopenex. To Call #[phone]. Aram Beecham called to get prior authorization and they stated that patient needs to try alternative drug 1. Proair, 2. Proventil or 3. Ventolin. Please Advise.

## 2015-07-26 NOTE — Telephone Encounter (Signed)
Jeremiah Perkins [phone] and spoke with Joni Reining and she will be sending a prior authorization form to be completed and signed by Dr. Darcus Pester. Made aware)

## 2015-07-26 NOTE — Telephone Encounter (Signed)
Pt needs Xopenex due to atrial flutter. other HFAs will cause heart rate to become uncontrolled. They will cause more harm than good

## 2015-07-30 NOTE — Progress Notes (Signed)
Cardiology Office Note   Date:  07/31/2015   ID:  Jeremiah Perkins, DOB 03-Apr-1961, MRN [id number]  Patient Care Team: Kirt Boys, DO as PCP - General (Internal Medicine) Cassell Clement, MD as Consulting Physician (Cardiology)    Chief Complaint  Patient presents with  . Hospitalization Follow-up    s/p visit to ED  . Congestive Heart Failure     History of Present Illness: Jeremiah Perkins is a 54 y.o. male with a hx of nonischemic cardiomyopathy, EF reduced to 20-25% the past, systolic HF, atrial fibrillation, DM2, OSA, obesity, HTN, gout and peripheral neuropathy, cholelithiasis. He is s/p DCCV in the past. Last seen by Dr. Patty Sermons 06/19/15. He was noted to be back in atrial flutter. Strategy of rate control was pursued and he was taken off of amiodarone. He is on Xarelto for anticoagulation.  He was seen in emergency room 07/23/15 with volume excess. He was given a dose of IV Lasix. Records indicate the ER physician spoke to Dr. Delton See and metolazone was added to his medical regimen. He was seen by his primary care physician 8/31. Metolazone was decreased to 3 times a week. He returns for follow-up.  Here alone.  He feels better since starting on Metolazone.  Weight is back to baseline. He had increased 13 lbs.  He is chronically short of breath. He is NYHA 2b-3 without significant change. Denies chest pain. Denies syncope.  Sleeps on 1 pillow.  No PND.  Edema is improved.    Studies/Reports Reviewed Today:  Echo 01/04/15 Poor image quality, EF likely at least mildly decreased, mild LVH, moderate LAE  Echo 5/15 EF 50-55%  LHC 07/2013 Left mainstem: Normal  Left anterior descending (LAD): Normal  Left circumflex (LCx): Normal  Right coronary artery (RCA): Normal  Left ventriculography: Left ventricular systolic function is markedly abnormal. It is moderately enlarged with severe global hypokinesis and overall EF of 20-25%. There is mild mitral insufficiency.  Final  Conclusions:  1. Normal coronary anatomy.  2. Severe LV dysfunction EF 20-25%   Myoview 3/05 IMPRESSION Suboptimal exam secondary to photon attenuation due to patient's size. No definite perfusion defects. Normal left ventricular ejection fraction of 61 percent.    Past Medical History  Diagnosis Date  . Hypertension   . Congestive heart failure     No echo data available. Myocardial perfusion 2005 with EF 61%, presumed diastolic HF   . Diverticulosis     with history of diverticulitis in 10/2011  . Gout   . Major depression   . Anxiety   . Psychosexual dysfunction with inhibited sexual excitement   . Hyperlipidemia   . Atrial flutter   . Morbid obesity   . Chronic bronchitis     "get it q yr" (01/02/2015)  . Shortness of breath     "all the time" (07/26/2013)  . Chronic back pain   . GERD (gastroesophageal reflux disease)   . Asthma   . Myocardial infarction     "one doctor said I did back in 2014"  . Pneumonia 2013; 2014  . OSA (obstructive sleep apnea) 1998    "wear my CPAP a couple times/month" (01/02/2015)  . Diabetic peripheral neuropathy   . Type II diabetes mellitus dx'd 1991    previously on insulin in 2000 for 3-4 years (but was stopped because adamantly did not want to be on insulin)  . Arthritis     involving knees, ankles, back, elbows (01/02/2015)  . Gout  Past Surgical History  Procedure Laterality Date  . Ankle reconstruction Right 1990's    2/2 trauma sustained after fall  . 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 Cyst removal leg Left 1960's    back of  leg  . Tee without cardioversion N/A 07/28/2013    Procedure: TRANSESOPHAGEAL ECHOCARDIOGRAM (TEE);  Surgeon: Laurey Morale, MD;  Location: [facility] ENDOSCOPY;  Service: Cardiovascular;  Laterality: N/A;  . Cardioversion N/A 07/28/2013    Procedure: CARDIOVERSION;  Surgeon: Laurey Morale, MD;  Location: [facility] ENDOSCOPY;  Service: Cardiovascular;  Laterality: N/A;  . Cardioversion N/A 10/26/2013     Procedure: CARDIOVERSION;  Surgeon: Cassell Clement, MD;  Location: [facility] ENDOSCOPY;  Service: Cardiovascular;  Laterality: N/A;  . Left heart catheterization with coronary angiogram N/A 07/27/2013    Procedure: LEFT HEART CATHETERIZATION WITH CORONARY ANGIOGRAM;  Surgeon: Peter M Swaziland, MD;  Location: [facility] CATH LAB;  Service: Cardiovascular;  Laterality: N/A;  . Cardiac catheterization  1990's; 07/2013  . Finger amputation       Current Outpatient Prescriptions  Medication Sig Dispense Refill  . albuterol (PROAIR HFA) 108 (90 BASE) MCG/ACT inhaler Inhale 2 puffs into the lungs every 4 (four) hours as needed for wheezing or shortness of breath. Inhale 1-2 puff every 4-6 hours 18 g 3  . calcium carbonate (TUMS - DOSED IN MG ELEMENTAL CALCIUM) 500 MG chewable tablet Chew 1 tablet by mouth as needed for indigestion or heartburn.    . carvedilol (COREG) 6.25 MG tablet Take 1 tablet (6.25 mg total) by mouth 2 (two) times daily. 60 tablet 5  . colchicine 0.6 MG tablet Take one tablet by mouth once daily for gout prevention 30 tablet 3  . diclofenac sodium (VOLTAREN) 1 % GEL Apply 2 g topically 4 (four) times daily.    Marland Kitchen gabapentin (NEURONTIN) 600 MG tablet Take 600mg  in the am & 1200mg  in the pm for pains 90 tablet 5  . HYDROcodone-acetaminophen (NORCO/VICODIN) 5-325 MG per tablet Take 1-2 tablets by mouth every 6 (six) hours as needed for moderate pain. 120 tablet 0  . losartan (COZAAR) 25 MG tablet Take 1 tablet (25 mg total) by mouth daily. 30 tablet 5  . metolazone (ZAROXOLYN) 2.5 MG tablet Take 30 minutes prior to morning Torsemide dose 3 times weekly 30 tablet 0  . ondansetron (ZOFRAN ODT) 8 MG disintegrating tablet Take 1 tablet (8 mg total) by mouth every 8 (eight) hours as needed for nausea or vomiting. 12 tablet 0  . oxyCODONE-acetaminophen (PERCOCET/ROXICET) 5-325 MG per tablet Take 1-2 tablets by mouth every 4 (four) hours as needed for severe pain. 60 tablet 0  . rivaroxaban (XARELTO) 20 MG  TABS tablet Take 20 mg by mouth every morning.    . sildenafil (VIAGRA) 100 MG tablet Take 100 mg by mouth daily as needed for erectile dysfunction.    . torsemide (DEMADEX) 20 MG tablet TAKE 2 TABLETS (40 MG TOTAL) BY MOUTH 2 (TWO) TIMES DAILY. 120 tablet 1  . XARELTO 20 MG TABS tablet TAKE 1 TABLET (20 MG TOTAL) BY MOUTH DAILY WITH SUPPER. (Patient taking differently: Take 1 tablet (20 mg total) by mouth daily with supper.) 30 tablet 6   No current facility-administered medications for this visit.    Allergies:   Eggs or egg-derived products and Sulfa antibiotics    Social History:  The patient  reports that he has been smoking Cigarettes.  He has a 1.2 pack-year smoking history. He has never  used smokeless tobacco. He reports that he drinks alcohol. He reports that he does not use illicit drugs.   Family History:  The patient's family history includes Alzheimer's disease in his maternal grandmother and mother; Breast cancer in his paternal aunt; CAD (age of onset: 76) in his brother; CAD (age of onset: 38) in his mother; Diabetes in his maternal aunt, maternal grandmother, and mother; Gout in his father; Hypertension in his brother and mother; Lung cancer in his father; Seizures in his brother and paternal uncle; Stomach cancer in his father.    ROS:   Please see the history of present illness.   Review of Systems  Constitution: Positive for decreased appetite.  Cardiovascular: Positive for dyspnea on exertion, leg swelling and orthopnea.  Gastrointestinal: Positive for abdominal pain.  All other systems reviewed and are negative.     PHYSICAL EXAM: VS:  BP 122/68 mmHg  Pulse 92  Ht 5\' 7"  (1.702 m)  Wt 277 lb 12.8 oz (126.009 kg)  BMI 43.50 kg/m2    Wt Readings from Last 3 Encounters:  07/31/15 277 lb 12.8 oz (126.009 kg)  07/25/15 271 lb (122.925 kg)  07/23/15 283 lb 3.2 oz (128.459 kg)     GEN: Well nourished, well developed, in no acute distress HEENT: normal Neck: no  JVD,   no masses Cardiac:  Normal S1/S2, irreg irreg rhythm; no murmur ,  no rubs or gallops, no edema   Respiratory:  clear to auscultation bilaterally, no wheezing, rhonchi or rales. GI: soft, nondistended  MS: no deformity or atrophy Skin: warm and dry  Neuro:  CNs II-XII intact, Strength and sensation are intact Psych: Normal affect   EKG:  EKG is ordered today.  It demonstrates:   Coarse AFib, HR 92, low voltage, PVCs, no significant    Recent Labs: 01/03/2015: TSH 5.852* 07/16/2015: ALT 24 07/23/2015: B Natriuretic Peptide 239.8*; BUN 22*; Creatinine, Ser 1.31*; Hemoglobin 13.3; Platelets 307; Potassium 3.4*; Sodium 139    Lipid Panel    Component Value Date/Time   CHOL 160 05/30/2015 1208   CHOL 186 01/04/2015 0518   TRIG 158* 05/30/2015 1208   HDL 40 05/30/2015 1208   HDL 33* 01/04/2015 0518   CHOLHDL 4.0 05/30/2015 1208   CHOLHDL 5.6 01/04/2015 0518   VLDL 40 01/04/2015 0518   LDLCALC 88 05/30/2015 1208   LDLCALC 113* 01/04/2015 0518      ASSESSMENT AND PLAN:    Chronic Systolic CHF:  He has a NICM.  EF has been as low as 20-25%.  He had a recent trip to the ED with volume excess. Symptoms and weight are improved.  He is on a large amount of Torsemide and, now, Metolazone 3 x a week.  Check FU BMET today.    NICM:  Continue beta-blocker, angiotensin receptor blocker.  Chronic Atrial Fibrillation/Flutter:  Rate control strategy. Continue Xarelto, beta-blocker.  Hypertension:  Controlled.  Surgical Clearance:  Patient needs to undergo lap cholecystectomy with Dr. Abbey Chatters.  He does not have any unstable cardiac conditions.  He is not that active, but had a cardiac cath 2 years ago with normal coronary arteries.  He does not need any further cardiac testing and should be at acceptable risk.  I reviewed his case with Dr. Delane Ginger (DOD) today who agreed.  He will need close attention to volume status to avoid volume excess at the time of his surgery.  He does not  have a documented hx of CVA.  If anticoagulation needs  to be held, he can hold his Xarelto for 2 doses prior to surgery.  This should be resumed post op when felt to be safe from a bleeding perspective by the surgeon.    Diabetes Mellitus:  FU with PCP.  Sleep Apnea:  Continue CPAP.   Medication Changes: Current medicines are reviewed at length with the patient today.  Concerns regarding medicines are as outlined above.  The following changes have been made:   Discontinued Medications   LEVALBUTEROL (XOPENEX HFA) 45 MCG/ACT INHALER    Inhale 1-2 puffs into the lungs every 4 (four) hours as needed for wheezing.   SPIRONOLACTONE (ALDACTONE) 25 MG TABLET    TAKE 1/2 TABLET (12.5 MG TOTAL) BY MOUTH DAILY.   Modified Medications   No medications on file   New Prescriptions   No medications on file    Labs/ tests ordered today include:   Orders Placed This Encounter  Procedures  . Basic Metabolic Panel (BMET)  . EKG 12-Lead      Disposition:    FU with Dr. Cassell Clement 09/2015 as planned.    Signed, Brynda Rim, MHS 07/31/2015 12:54 PM    [facility] [address] [place], [place], [place]  [address] Phone: [phone]; Fax: [phone]

## 2015-07-31 ENCOUNTER — Encounter: Payer: Self-pay | Admitting: Physician Assistant

## 2015-07-31 ENCOUNTER — Ambulatory Visit (INDEPENDENT_AMBULATORY_CARE_PROVIDER_SITE_OTHER): Payer: PPO | Admitting: Physician Assistant

## 2015-07-31 VITALS — BP 122/68 | HR 92 | Ht 67.0 in | Wt 277.8 lb

## 2015-07-31 DIAGNOSIS — I10 Essential (primary) hypertension: Secondary | ICD-10-CM

## 2015-07-31 DIAGNOSIS — E1140 Type 2 diabetes mellitus with diabetic neuropathy, unspecified: Secondary | ICD-10-CM

## 2015-07-31 DIAGNOSIS — G4733 Obstructive sleep apnea (adult) (pediatric): Secondary | ICD-10-CM

## 2015-07-31 DIAGNOSIS — I482 Chronic atrial fibrillation, unspecified: Secondary | ICD-10-CM

## 2015-07-31 DIAGNOSIS — I429 Cardiomyopathy, unspecified: Secondary | ICD-10-CM

## 2015-07-31 DIAGNOSIS — I5022 Chronic systolic (congestive) heart failure: Secondary | ICD-10-CM

## 2015-07-31 DIAGNOSIS — I428 Other cardiomyopathies: Secondary | ICD-10-CM

## 2015-07-31 LAB — BASIC METABOLIC PANEL
BUN: 60 mg/dL — AB (ref 6–23)
CALCIUM: 9.1 mg/dL (ref 8.4–10.5)
CO2: 29 meq/L (ref 19–32)
Chloride: 88 mEq/L — ABNORMAL LOW (ref 96–112)
Creatinine, Ser: 1.81 mg/dL — ABNORMAL HIGH (ref 0.40–1.50)
GFR: 50.12 mL/min — AB (ref >60.00)
GLUCOSE: 163 mg/dL — AB (ref 70–99)
Potassium: 3.5 mEq/L (ref 3.5–5.1)
Sodium: 132 mEq/L — ABNORMAL LOW (ref 135–145)

## 2015-07-31 NOTE — Telephone Encounter (Signed)
Received fax from [facility] APPROVED from 07/29/2015 until 11/24/2015. [facility] #: [phone] Fax: [phone]

## 2015-07-31 NOTE — Patient Instructions (Signed)
Medication Instructions:  Your physician recommends that you continue on your current medications as directed. Please refer to the Current Medication list given to you today.   Labwork: TODAY BMET  Testing/Procedures: NONE  Follow-Up: KEEP YOUR FOLLOW WITH DR. Patty Sermons ON 10/24/15  Any Other Special Instructions Will Be Listed Below (If Applicable).

## 2015-08-01 ENCOUNTER — Telehealth: Payer: Self-pay | Admitting: *Deleted

## 2015-08-01 DIAGNOSIS — N183 Chronic kidney disease, stage 3 unspecified: Secondary | ICD-10-CM

## 2015-08-01 DIAGNOSIS — I10 Essential (primary) hypertension: Secondary | ICD-10-CM

## 2015-08-01 DIAGNOSIS — I5022 Chronic systolic (congestive) heart failure: Secondary | ICD-10-CM

## 2015-08-01 NOTE — Telephone Encounter (Signed)
Both pt and wife notified of lab results and med changes;  1. STOP METOLAZONE  2. HOLD TORSEMIDE X 1 DAY; THEN TAKE TORSEMIDE 40 MG ONCE DAILY X 1 DAY;  THEN RESUME TORSEMIDE 40 MG BID  3.HOLD LOSARTAN X 1 WEEK THEN RESUME  4. STAT BMET 9/9 MORNING                  STAT BMET 9/9 AM; pt verbalized understanding to plan of care.

## 2015-08-03 ENCOUNTER — Other Ambulatory Visit (INDEPENDENT_AMBULATORY_CARE_PROVIDER_SITE_OTHER): Payer: PPO | Admitting: *Deleted

## 2015-08-03 ENCOUNTER — Encounter: Payer: Self-pay | Admitting: Internal Medicine

## 2015-08-03 ENCOUNTER — Ambulatory Visit (INDEPENDENT_AMBULATORY_CARE_PROVIDER_SITE_OTHER): Payer: PPO | Admitting: Internal Medicine

## 2015-08-03 ENCOUNTER — Telehealth: Payer: Self-pay

## 2015-08-03 ENCOUNTER — Encounter: Payer: Self-pay | Admitting: General Surgery

## 2015-08-03 VITALS — BP 120/78 | HR 88 | Temp 97.7°F | Resp 20 | Ht 67.0 in | Wt 279.0 lb

## 2015-08-03 DIAGNOSIS — I4892 Unspecified atrial flutter: Secondary | ICD-10-CM

## 2015-08-03 DIAGNOSIS — K8020 Calculus of gallbladder without cholecystitis without obstruction: Secondary | ICD-10-CM

## 2015-08-03 DIAGNOSIS — Z0000 Encounter for general adult medical examination without abnormal findings: Secondary | ICD-10-CM | POA: Diagnosis not present

## 2015-08-03 DIAGNOSIS — Z125 Encounter for screening for malignant neoplasm of prostate: Secondary | ICD-10-CM | POA: Diagnosis not present

## 2015-08-03 DIAGNOSIS — N183 Chronic kidney disease, stage 3 unspecified: Secondary | ICD-10-CM

## 2015-08-03 DIAGNOSIS — E118 Type 2 diabetes mellitus with unspecified complications: Secondary | ICD-10-CM

## 2015-08-03 DIAGNOSIS — Z7901 Long term (current) use of anticoagulants: Secondary | ICD-10-CM

## 2015-08-03 DIAGNOSIS — M7022 Olecranon bursitis, left elbow: Secondary | ICD-10-CM | POA: Diagnosis not present

## 2015-08-03 DIAGNOSIS — I5022 Chronic systolic (congestive) heart failure: Secondary | ICD-10-CM | POA: Diagnosis not present

## 2015-08-03 DIAGNOSIS — I10 Essential (primary) hypertension: Secondary | ICD-10-CM

## 2015-08-03 DIAGNOSIS — R899 Unspecified abnormal finding in specimens from other organs, systems and tissues: Secondary | ICD-10-CM

## 2015-08-03 DIAGNOSIS — M1990 Unspecified osteoarthritis, unspecified site: Secondary | ICD-10-CM | POA: Diagnosis not present

## 2015-08-03 DIAGNOSIS — Z23 Encounter for immunization: Secondary | ICD-10-CM

## 2015-08-03 LAB — BASIC METABOLIC PANEL
BUN: 40 mg/dL — ABNORMAL HIGH (ref 6–23)
CHLORIDE: 93 meq/L — AB (ref 96–112)
CO2: 27 meq/L (ref 19–32)
CREATININE: 1.24 mg/dL (ref 0.40–1.50)
Calcium: 9.4 mg/dL (ref 8.4–10.5)
GFR: 77.54 mL/min (ref >60.00)
Glucose, Bld: 184 mg/dL — ABNORMAL HIGH (ref 70–99)
POTASSIUM: 3.7 meq/L (ref 3.5–5.1)
Sodium: 132 mEq/L — ABNORMAL LOW (ref 135–145)

## 2015-08-03 NOTE — Patient Instructions (Addendum)
Encouraged him to exercise 30-45 minutes 4-5 times per week. Eat a well balanced diet. Avoid smoking. Limit alcohol intake. Wear seatbelt when riding in the car. Wear sun block (SPF >50) when spending extended times outside.  Flu shot given today  PSA lab in 3-4 weeks  Continue current medications as ordered  Follow up with specialists as scheduled  Will call with lab results  Follow up in 3 mos for routine visit

## 2015-08-03 NOTE — Progress Notes (Signed)
Patient ID: Jeremiah Perkins, male   DOB: 01/14/1961, 54 y.o.   MRN: [id number] Subjective:     BASHAR MILAM is a 54 y.o. male and is here for a comprehensive physical exam. The patient reports problems - awaiting to hear back from surgery to have gallbladder out. he was cleared by cardio earlier this week. he still has left elbow pain and has not seen rheumatology yet.  He saw cardiology and Cr was elevated. meds adjusted and had f/u blood test this AM which showed improvement but will cont with reduced meds until redraw next week. He is taking 1/2 dose of torsemide and losartan on hold.  Past Medical History  Diagnosis Date  . Hypertension   . Congestive heart failure     No echo data available. Myocardial perfusion 2005 with EF 61%, presumed diastolic HF   . Diverticulosis     with history of diverticulitis in 10/2011  . Gout   . Major depression   . Anxiety   . Psychosexual dysfunction with inhibited sexual excitement   . Hyperlipidemia   . Atrial flutter   . Morbid obesity   . Chronic bronchitis     "get it q yr" (01/02/2015)  . Shortness of breath     "all the time" (07/26/2013)  . Chronic back pain   . GERD (gastroesophageal reflux disease)   . Asthma   . Myocardial infarction     "one doctor said I did back in 2014"  . Pneumonia 2013; 2014  . OSA (obstructive sleep apnea) 1998    "wear my CPAP a couple times/month" (01/02/2015)  . Diabetic peripheral neuropathy   . Type II diabetes mellitus dx'd 1991    previously on insulin in 2000 for 3-4 years (but was stopped because adamantly did not want to be on insulin)  . Arthritis     involving knees, ankles, back, elbows (01/02/2015)  . Gout    Past Surgical History  Procedure Laterality Date  . Ankle reconstruction Right 1990's    2/2 trauma sustained after fall  . 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 Cyst removal leg Left 1960's    back of  leg  . Tee without cardioversion N/A 07/28/2013    Procedure: TRANSESOPHAGEAL  ECHOCARDIOGRAM (TEE);  Surgeon: Larey Dresser, MD;  Location: [facility];  Service: Cardiovascular;  Laterality: N/A;  . Cardioversion N/A 07/28/2013    Procedure: CARDIOVERSION;  Surgeon: Larey Dresser, MD;  Location: [facility];  Service: Cardiovascular;  Laterality: N/A;  . Cardioversion N/A 10/26/2013    Procedure: CARDIOVERSION;  Surgeon: [name], MD;  Location: [facility];  Service: Cardiovascular;  Laterality: N/A;  . Left heart catheterization with coronary angiogram N/A 07/27/2013    Procedure: LEFT HEART CATHETERIZATION WITH CORONARY ANGIOGRAM;  Surgeon: Peter M Martinique, MD;  Location: [facility] CATH LAB;  Service: Cardiovascular;  Laterality: N/A;  . Cardiac catheterization  1990's; 07/2013  . Finger amputation     Family History  Problem Relation Age of Onset  . Diabetes Mother   . CAD Mother 42    requiring quadruple bypass  . Hypertension Mother   . Alzheimer's disease Mother   . Stomach cancer Father   . Lung cancer Father     was a smoker  . Gout Father   . CAD Brother 39    requiring quadruple bypass  . Seizures Brother   . Hypertension Brother   . Diabetes Maternal Grandmother   .  Alzheimer's disease Maternal Grandmother   . Breast cancer Paternal Aunt   . Diabetes Maternal Aunt   . Seizures Paternal Uncle     Social History   Social History  . Marital Status: Married    Spouse Name: N/A  . Number of Children: 3  . Years of Education: 14   Occupational History  . Now unemployed     Conservation officer, nature at [employer]  .  [employer]   Social History Main Topics  . Smoking status: Current Some Day Smoker -- 0.12 packs/day for 10 years    Types: Cigarettes  . Smokeless tobacco: Never Used     Comment: rarely.  . Alcohol Use: 0.0 oz/week    0 Standard drinks or equivalent per week     Comment: Rarely.  . Drug Use: No  . Sexual Activity: Not on file   Other Topics Concern  . Not on file   Social History Narrative   Previously worked as Conservation officer, nature  and lives at home with his wife. They each have children but none between the two of them.      Diet:      Do you drink/ eat things with caffeine?  Yes      Marital status:   Married                             What year were you married ? 2010      Do you live in a house, apartment,assistred living, condo, trailer, etc.)? House      Is it one or more stories? Yes      How many persons live in your home ? 3      Do you have any pets in your home ?(please list) Dog      Current or past profession: Aircraft Mechanic/Instructor      Do you exercise?  Yes                            Type & how often:  Golf,cut grass, walk      Do you have a living will? No      Do you have a DNR form?   No                    If not, do you want to discuss one?       Do you have signed POA?HPOA forms?   No              If so, please bring to your        appointment         Health Maintenance  Topic Date Due  . Hepatitis C Screening  March 01, 1961  . FOOT EXAM  10/07/2014  . INFLUENZA VACCINE  06/25/2015  . URINE MICROALBUMIN  07/29/2015  . HEMOGLOBIN A1C  08/30/2015  . OPHTHALMOLOGY EXAM  12/01/2015  . LIPID PANEL  05/29/2016  . PNEUMOCOCCAL POLYSACCHARIDE VACCINE (2) 07/12/2018  . COLONOSCOPY  11/05/2022  . TETANUS/TDAP  07/20/2023  . HIV Screening  Completed    Review of Systems   Review of Systems  Constitutional: Negative for fever, chills and malaise/fatigue.  HENT: Negative for sore throat and tinnitus.   Eyes: Negative for blurred vision and double vision.  Respiratory: Negative for cough, shortness of breath and wheezing.   Cardiovascular: Positive for palpitations. Negative for chest pain,  orthopnea and leg swelling.  Gastrointestinal: Positive for nausea and abdominal pain. Negative for heartburn, vomiting, diarrhea, constipation and blood in stool.  Genitourinary: Negative for dysuria, urgency, frequency and hematuria.  Musculoskeletal: Positive for back pain and joint pain.  Negative for myalgias and falls.  Skin: Negative for rash.  Neurological: Negative for dizziness, tingling, tremors, sensory change, focal weakness, seizures, loss of consciousness, weakness and headaches.  Endo/Heme/Allergies: Negative for environmental allergies. Does not bruise/bleed easily.  Psychiatric/Behavioral: Negative for depression and memory loss. The patient is not nervous/anxious and does not have insomnia.      Objective:      Physical Exam  Constitutional: He is oriented to person, place, and time and well-developed, well-nourished, and in no distress.  HENT:  Head: Normocephalic and atraumatic.  Right Ear: Hearing, tympanic membrane, external ear and ear canal normal.  Left Ear: Hearing, tympanic membrane, external ear and ear canal normal.  Mouth/Throat: Uvula is midline, oropharynx is clear and moist and mucous membranes are normal.  Eyes: Conjunctivae, EOM and lids are normal. Right eye exhibits no discharge. No scleral icterus.  Neck: Trachea normal. Neck supple. Carotid bruit is not present. No tracheal deviation present. No thyroid mass and no thyromegaly present.  Cardiovascular: Normal rate and intact distal pulses.  An irregularly irregular rhythm present. Exam reveals no gallop and no friction rub.   Murmur heard.  Systolic murmur is present with a grade of 1/6  Pulmonary/Chest: Effort normal and breath sounds normal. No stridor. No respiratory distress. He has no wheezes. He has no rhonchi. He has no rales. He exhibits no mass, no tenderness and no crepitus. Right breast exhibits no inverted nipple, no mass, no nipple discharge, no skin change and no tenderness. Left breast exhibits no inverted nipple, no mass, no nipple discharge, no skin change and no tenderness. Breasts are symmetrical.  Abdominal: Soft. Normal appearance, normal aorta and bowel sounds are normal. He exhibits no abdominal bruit, no ascites, no pulsatile midline mass and no mass. There is no  hepatosplenomegaly. There is tenderness (RUQ with (+) McMurray). There is no rebound. No hernia.  Genitourinary: Rectum normal. Rectal exam shows no external hemorrhoid, no mass and no tenderness. Guaiac negative stool. Prostate is enlarged (no palpable mass, smooth). Prostate is not tender.  Declined hernia/penis exam  Musculoskeletal: He exhibits edema and tenderness.  Left olecranon egg sized bursitis, TTP. 2nd enlarged, TTP subcut lump posterior forearm, TTP  Lymphadenopathy:       Head (right side): No submandibular and no posterior auricular adenopathy present.       Head (left side): No submandibular and no posterior auricular adenopathy present.    He has no cervical adenopathy.       Right: No supraclavicular adenopathy present.       Left: No supraclavicular adenopathy present.  Neurological: He is alert and oriented to person, place, and time. He has normal motor skills, normal strength and normal reflexes. Gait normal.  Skin: Skin is warm, dry and intact. No rash noted.  Psychiatric: Mood, memory, affect and judgment normal.   Diabetic Foot Exam - Simple   Simple Foot Form  Diabetic Foot exam was performed with the following findings:  Yes 08/03/2015  5:19 PM  Visual Inspection  See comments:  Yes  Sensation Testing  Pulse Check  Posterior Tibialis and Dorsalis pulse intact bilaterally:  Yes  Comments  B/l hammertoes. No bunions. No calluses or ulcerations. Monofilament testing reduced b/l.      MMSE -  Mini Mental State Exam 08/03/2015  Orientation to time 5  Orientation to Place 5  Registration 3  Attention/ Calculation 0  Recall 1  Language- name 2 objects 2  Language- repeat 1  Language- follow 3 step command 3  Language- read & follow direction 1  Write a sentence 1  Copy design 0  Total score 22  He was unable to complete copy and attention portion   Recent Results (from the past 2160 hour(s))  Basic metabolic panel     Status: Abnormal   Collection Time:  05/11/15  9:40 AM  Result Value Ref Range   Sodium 134 (L) 135 - 145 mEq/L   Potassium 4.1 3.5 - 5.1 mEq/L   Chloride 100 96 - 112 mEq/L   CO2 27 19 - 32 mEq/L   Glucose, Bld 170 (H) 70 - 99 mg/dL   BUN 20 6 - 23 mg/dL   Creatinine, Ser 1.17 0.40 - 1.50 mg/dL   Calcium 9.3 8.4 - 10.5 mg/dL   GFR 82.99 >60.00 mL/min  CMP     Status: Abnormal   Collection Time: 05/30/15 12:08 PM  Result Value Ref Range   Glucose 150 (H) 65 - 99 mg/dL    Comment: Specimen received in contact with cells. No visible hemolysis present. However GLUC may be decreased and K increased. Clinical correlation indicated.    BUN 23 6 - 24 mg/dL   Creatinine, Ser 1.28 (H) 0.76 - 1.27 mg/dL   GFR calc non Af Amer 63 >59 mL/min/1.73   GFR calc Af Amer 72 >59 mL/min/1.73   BUN/Creatinine Ratio 18 9 - 20   Sodium 143 134 - 144 mmol/L   Potassium 4.2 3.5 - 5.2 mmol/L   Chloride 96 (L) 97 - 108 mmol/L   CO2 25 18 - 29 mmol/L   Calcium 9.1 8.7 - 10.2 mg/dL   Total Protein 7.2 6.0 - 8.5 g/dL   Albumin 4.0 3.5 - 5.5 g/dL   Globulin, Total 3.2 1.5 - 4.5 g/dL   Albumin/Globulin Ratio 1.3 1.1 - 2.5   Bilirubin Total 1.0 0.0 - 1.2 mg/dL   Alkaline Phosphatase 96 39 - 117 IU/L   AST 25 0 - 40 IU/L   ALT 21 0 - 44 IU/L  Hemoglobin A1c     Status: Abnormal   Collection Time: 05/30/15 12:08 PM  Result Value Ref Range   Hgb A1c MFr Bld 6.7 (H) 4.8 - 5.6 %    Comment:          Pre-diabetes: 5.7 - 6.4          Diabetes: >6.4          Glycemic control for adults with diabetes: <7.0    Est. average glucose Bld gHb Est-mCnc 146 mg/dL  Lipid Panel     Status: Abnormal   Collection Time: 05/30/15 12:08 PM  Result Value Ref Range   Cholesterol, Total 160 100 - 199 mg/dL   Triglycerides 158 (H) 0 - 149 mg/dL   HDL 40 >39 mg/dL    Comment: According to ATP-III Guidelines, HDL-C >59 mg/dL is considered a negative risk factor for CHD.    VLDL Cholesterol Cal 32 5 - 40 mg/dL   LDL Calculated 88 0 - 99 mg/dL   Chol/HDL  Ratio 4.0 0.0 - 5.0 ratio units    Comment:  T. Chol/HDL Ratio                                             Men  Women                               1/2 Avg.Risk  3.4    3.3                                   Avg.Risk  5.0    4.4                                2X Avg.Risk  9.6    7.1                                3X Avg.Risk 23.4   11.0   Lipase, blood     Status: None   Collection Time: 07/16/15  2:15 PM  Result Value Ref Range   Lipase 26 22 - 51 U/L  Comprehensive metabolic panel     Status: Abnormal   Collection Time: 07/16/15  2:15 PM  Result Value Ref Range   Sodium 137 135 - 145 mmol/L   Potassium 3.6 3.5 - 5.1 mmol/L   Chloride 100 (L) 101 - 111 mmol/L   CO2 27 22 - 32 mmol/L   Glucose, Bld 139 (H) 65 - 99 mg/dL   BUN 16 6 - 20 mg/dL   Creatinine, Ser 1.12 0.61 - 1.24 mg/dL   Calcium 8.7 (L) 8.9 - 10.3 mg/dL   Total Protein 7.6 6.5 - 8.1 g/dL   Albumin 3.5 3.5 - 5.0 g/dL   AST 30 15 - 41 U/L   ALT 24 17 - 63 U/L   Alkaline Phosphatase 73 38 - 126 U/L   Total Bilirubin 1.2 0.3 - 1.2 mg/dL   GFR calc non Af Amer >60 >60 mL/min   GFR calc Af Amer >60 >60 mL/min    Comment: (NOTE) The eGFR has been calculated using the CKD EPI equation. This calculation has not been validated in all clinical situations. eGFR's persistently <60 mL/min signify possible Chronic Kidney Disease.    Anion gap 10 5 - 15  CBC     Status: None   Collection Time: 07/16/15  2:15 PM  Result Value Ref Range   WBC 6.6 4.0 - 10.5 K/uL   RBC 4.76 4.22 - 5.81 MIL/uL   Hemoglobin 14.3 13.0 - 17.0 g/dL   HCT 42.9 39.0 - 52.0 %   MCV 90.1 78.0 - 100.0 fL   MCH 30.0 26.0 - 34.0 pg   MCHC 33.3 30.0 - 36.0 g/dL   RDW 14.0 11.5 - 15.5 %   Platelets 254 150 - 400 K/uL  Urinalysis, Routine w reflex microscopic (not at [facility])     Status: Abnormal   Collection Time: 07/16/15  6:45 PM  Result Value Ref Range   Color, Urine YELLOW YELLOW   APPearance CLOUDY (A) CLEAR    Specific Gravity, Urine 1.017 1.005 - 1.030   pH 5.5 5.0 - 8.0   Glucose, UA NEGATIVE NEGATIVE mg/dL  Hgb urine dipstick NEGATIVE NEGATIVE   Bilirubin Urine NEGATIVE NEGATIVE   Ketones, ur NEGATIVE NEGATIVE mg/dL   Protein, ur NEGATIVE NEGATIVE mg/dL   Urobilinogen, UA 0.2 0.0 - 1.0 mg/dL   Nitrite NEGATIVE NEGATIVE   Leukocytes, UA NEGATIVE NEGATIVE    Comment: MICROSCOPIC NOT DONE ON URINES WITH NEGATIVE PROTEIN, BLOOD, LEUKOCYTES, NITRITE, OR GLUCOSE <1000 mg/dL.  Urine culture     Status: None   Collection Time: 07/16/15  6:45 PM  Result Value Ref Range   Specimen Description URINE, CLEAN CATCH    Special Requests NONE    Culture      NO GROWTH 2 DAYS Performed at [facility]    Report Status 07/18/2015 FINAL   Basic metabolic panel     Status: Abnormal   Collection Time: 07/23/15  2:27 PM  Result Value Ref Range   Sodium 139 135 - 145 mmol/L   Potassium 3.4 (L) 3.5 - 5.1 mmol/L   Chloride 102 101 - 111 mmol/L   CO2 27 22 - 32 mmol/L   Glucose, Bld 127 (H) 65 - 99 mg/dL   BUN 22 (H) 6 - 20 mg/dL   Creatinine, Ser 1.31 (H) 0.61 - 1.24 mg/dL   Calcium 8.9 8.9 - 10.3 mg/dL   GFR calc non Af Amer 59 (L) >60 mL/min   GFR calc Af Amer >60 >60 mL/min    Comment: (NOTE) The eGFR has been calculated using the CKD EPI equation. This calculation has not been validated in all clinical situations. eGFR's persistently <60 mL/min signify possible Chronic Kidney Disease.    Anion gap 10 5 - 15  CBC with Differential     Status: None   Collection Time: 07/23/15  2:27 PM  Result Value Ref Range   WBC 6.3 4.0 - 10.5 K/uL   RBC 4.47 4.22 - 5.81 MIL/uL   Hemoglobin 13.3 13.0 - 17.0 g/dL   HCT 39.7 39.0 - 52.0 %   MCV 88.8 78.0 - 100.0 fL   MCH 29.8 26.0 - 34.0 pg   MCHC 33.5 30.0 - 36.0 g/dL   RDW 13.9 11.5 - 15.5 %   Platelets 307 150 - 400 K/uL   Neutrophils Relative % 70 43 - 77 %   Neutro Abs 4.4 1.7 - 7.7 K/uL   Lymphocytes Relative 21 12 - 46 %   Lymphs Abs 1.3  0.7 - 4.0 K/uL   Monocytes Relative 6 3 - 12 %   Monocytes Absolute 0.4 0.1 - 1.0 K/uL   Eosinophils Relative 2 0 - 5 %   Eosinophils Absolute 0.1 0.0 - 0.7 K/uL   Basophils Relative 1 0 - 1 %   Basophils Absolute 0.0 0.0 - 0.1 K/uL  I-stat troponin, ED     Status: None   Collection Time: 07/23/15  2:36 PM  Result Value Ref Range   Troponin i, poc 0.04 0.00 - 0.08 ng/mL   Comment 3            Comment: Due to the release kinetics of cTnI, a negative result within the first hours of the onset of symptoms does not rule out myocardial infarction with certainty. If myocardial infarction is still suspected, repeat the test at appropriate intervals.   Brain natriuretic peptide     Status: Abnormal   Collection Time: 07/23/15  4:31 PM  Result Value Ref Range   B Natriuretic Peptide 239.8 (H) 0.0 - 100.0 pg/mL  Basic Metabolic Panel (BMET)  Status: Abnormal   Collection Time: 07/31/15 12:55 PM  Result Value Ref Range   Sodium 132 (L) 135 - 145 mEq/L   Potassium 3.5 3.5 - 5.1 mEq/L   Chloride 88 (L) 96 - 112 mEq/L   CO2 29 19 - 32 mEq/L   Glucose, Bld 163 (H) 70 - 99 mg/dL   BUN 60 (H) 6 - 23 mg/dL   Creatinine, Ser 1.81 (H) 0.40 - 1.50 mg/dL   Calcium 9.1 8.4 - 10.5 mg/dL   GFR 50.12 (L) >60.00 mL/min  Basic Metabolic Panel (BMET)     Status: Abnormal   Collection Time: 08/03/15  9:01 AM  Result Value Ref Range   Sodium 132 (L) 135 - 145 mEq/L   Potassium 3.7 3.5 - 5.1 mEq/L   Chloride 93 (L) 96 - 112 mEq/L   CO2 27 19 - 32 mEq/L   Glucose, Bld 184 (H) 70 - 99 mg/dL   BUN 40 (H) 6 - 23 mg/dL   Creatinine, Ser 1.24 0.40 - 1.50 mg/dL   Calcium 9.4 8.4 - 10.5 mg/dL   GFR 77.54 >60.00 mL/min       Assessment:    Healthy male exam.       ICD-9-CM ICD-10-CM   1. Well adult exam V70.0 Z00.00   2. Calculus of gallbladder w/o mention of cholecystitis or obstruction 574.20 K80.20   3. Diabetes mellitus type 2, controlled, with complications 701.41 C30.1   4. Long term  current use of anticoagulant therapy V58.61 Z79.01   5. Chronic systolic heart failure 314.38 I50.22   6. Atrial flutter, unspecified 427.32 I48.92   7. Arthritis 716.90 M19.90   8. Screening for prostate cancer V76.44 Z12.5 PSA  9.      Left olecranon bursitis - probably gout  Plan:     See After Visit Summary for Counseling Recommendations   Pt is UTD on health maintenance. Vaccinations are UTD. Pt maintains a healthy lifestyle. Encouraged pt to exercise 30-45 minutes 4-5 times per week. Eat a well balanced diet. Avoid smoking. Limit alcohol intake. Wear seatbelt when riding in the car. Wear sun block (SPF >50) when spending extended times outside.  influenza vaccine given today  PSA lab in 3-4 weeks  Continue current medications as ordered  Follow up with specialists as scheduled  Will call with lab results  Follow up in 3 mos for routine visit  Jaree Trinka S. Perlie Gold  [facility] [address] [place], [place] [address] [phone] Cell (Monday-Friday 8 AM - 5 PM) [phone] After 5 PM and follow prompts

## 2015-08-03 NOTE — Progress Notes (Signed)
Passed clock test 

## 2015-08-03 NOTE — Telephone Encounter (Signed)
Called patient about lab results. Per Tereso Newcomer PA, BUN and Creatinine improved. K+ ok. Continue with current treatment plan. Repeat BMET 1 week. Patient verbalized understanding and will come in 08/10/2015 next week and have a repeat BMET.

## 2015-08-03 NOTE — Progress Notes (Signed)
Jeremiah Perkins 07/25/2015 9:13 AM DOB: 04/03/1961 Married / Language: English / Race: Black or African American Male  History of Present Illness Jeremiah Pollack MD; 07/25/2015 9:49 AM) Patient words: gallbladder.  The patient is a 54 year old male   Note:He presents today because he is continuing to have symptoms from his cholelithiasis. He is tried diet control but has multiple episodes of biliary colic. He actually went to the emergency department last week. He is interested in proceeding with cholecystectomy. He has recently been treated for an exacerbation of his congestive heart failure.    Allergies  Sulfa Antibiotics  Medication History  Albuterol Sulfate (108 (90 Base)MCG/ACT Aero Pow Br Act, Inhalation) Active. Carvedilol (6.25MG  Tablet, Oral) Active. Colchicine (0.6MG  Capsule, Oral) Active. Gabapentin (600MG  Tablet, Oral) Active. Xopenex HFA (45MCG/ACT Aerosol, Inhalation) Active. Losartan Potassium (25MG  Tablet, Oral) Active. MetFORMIN HCl (1000MG  Tablet, Oral) Active. Sildenafil Citrate (100MG  Tablet, Oral) Active. Spironolactone (25MG  Tablet, Oral) Active. Torsemide (20MG  Tablet, Oral) Active. Xarelto (20MG  Tablet, Oral) Active. Medications Reconciled  Review of Systems  Note: No fevers with the episodes.  Physical Exam   The physical exam findings are as follows: Note:General-morbidly obese male in no acute distress.  Eyes-no icterus.  Cardiovascular-regular rate and rhythm, no JVD.  Lungs-clear.  Abdomen-soft, massively obese, no right upper quadrant or epigastric tenderness.    Assessment & Plan SYMPTOMATIC CHOLELITHIASIS (574.20  K80.20) Impression: He has failed conservative management. He is interested in having the surgery. Had a recent episode of congestive heart failure and will be seeing Dr. Patty Perkins for that next week.  Plan: Schedule laparoscopic cholecystectomy.  He is felt to be an acceptable risk from a  cardiac standpoint. We went over the procedure and risks again in detail as we did before.   Jeremiah Peace, MD

## 2015-08-07 ENCOUNTER — Telehealth: Payer: Self-pay | Admitting: *Deleted

## 2015-08-07 NOTE — Telephone Encounter (Signed)
Patient called and wanted to know if he can take ______________ Protein. I am not sure what kind of Protein he stated on voicemail. Called patient back and LMOM to return my call regarding what type of Protein.

## 2015-08-08 ENCOUNTER — Telehealth: Payer: Self-pay | Admitting: Cardiology

## 2015-08-08 NOTE — Telephone Encounter (Signed)
Spoke with patient and he will bring patient assistance forms by when he receives them later this year

## 2015-08-08 NOTE — Telephone Encounter (Signed)
Ordered already and in process Order [id number]

## 2015-08-08 NOTE — Telephone Encounter (Signed)
Do not recommend that he takes any protein shakes due to medical problems (CHF, CKD, etc) as it can make them worse

## 2015-08-08 NOTE — Telephone Encounter (Signed)
Advised patient approved and ordered for patient  [employer] reorder for viagra  Reorder phone [phone]  Patient id # [phone]

## 2015-08-08 NOTE — Telephone Encounter (Signed)
New message ° ° ° ° °Pt returning your call °

## 2015-08-08 NOTE — Telephone Encounter (Signed)
Patient called back and stated that he wants to take a Protein Shake Whey-- once daily if it is ok with his other medications he is on. Please Advise.

## 2015-08-08 NOTE — Telephone Encounter (Signed)
Left message to call back  Approved until 11-24-2015

## 2015-08-09 NOTE — Telephone Encounter (Signed)
Patient notified. Patient wants to know what he can drink that would be ok for him. Would Ensure be ok? Patient wants something to build up his muscles and lose weight.  Please Advise.

## 2015-08-10 ENCOUNTER — Other Ambulatory Visit (INDEPENDENT_AMBULATORY_CARE_PROVIDER_SITE_OTHER): Payer: PPO | Admitting: *Deleted

## 2015-08-10 ENCOUNTER — Telehealth: Payer: Self-pay | Admitting: *Deleted

## 2015-08-10 DIAGNOSIS — R899 Unspecified abnormal finding in specimens from other organs, systems and tissues: Secondary | ICD-10-CM

## 2015-08-10 LAB — BASIC METABOLIC PANEL
BUN: 29 mg/dL — AB (ref 6–23)
CALCIUM: 8.9 mg/dL (ref 8.4–10.5)
CO2: 27 meq/L (ref 19–32)
Chloride: 100 mEq/L (ref 96–112)
Creatinine, Ser: 1.32 mg/dL (ref 0.40–1.50)
GFR: 72.14 mL/min (ref >60.00)
GLUCOSE: 161 mg/dL — AB (ref 70–99)
POTASSIUM: 3.9 meq/L (ref 3.5–5.1)
Sodium: 137 mEq/L (ref 135–145)

## 2015-08-10 NOTE — Addendum Note (Signed)
Addended by: Tonita Phoenix on: 08/10/2015 09:39 AM   Modules accepted: Orders

## 2015-08-10 NOTE — Telephone Encounter (Signed)
Pt notified of lab results by phone with verbal understanding.  

## 2015-08-13 NOTE — Patient Instructions (Addendum)
YOUR PROCEDURE IS SCHEDULED ON : 08/24/15  REPORT TO [facility] MAIN ENTRANCE FOLLOW SIGNS TO EAST ELEVATOR - GO TO 3rd FLOOR CHECK IN AT 3 EAST NURSES STATION (SHORT STAY) AT:  8:00 AM  CALL THIS NUMBER IF YOU HAVE PROBLEMS THE MORNING OF SURGERY [phone]  REMEMBER:ONLY 1 PER PERSON MAY GO TO SHORT STAY WITH YOU TO GET READY THE MORNING OF YOUR SURGERY  DO NOT EAT FOOD OR DRINK LIQUIDS AFTER MIDNIGHT  TAKE THESE MEDICINES THE MORNING OF SURGERY: CARVEDILOL / PREDNISONE / COLCHICINE / GABAPENTIN / USE ALBUTEROL INHALER AS NEEDED (BRING INHALER TO HOSPITAL) XOPENEX INHALER / MAY USE PERCOCET / ZOFRAN IF NEEDED  STOP ASPIRIN / IBUPROFEN / ALEVE / VITAMINS / HERBAL MEDS __7__ DAYS BEFORE SURGERY  STOP XARELTO 3 DAYS BEFORE SURGERY  BRING C-PAP TUBING AND MASK TO HOSPITAL  YOU MAY NOT HAVE ANY METAL ON YOUR BODY INCLUDING HAIR PINS AND PIERCING'S. DO NOT WEAR JEWELRY, MAKEUP, LOTIONS, POWDERS OR PERFUMES. DO NOT WEAR NAIL POLISH. DO NOT SHAVE 48 HRS PRIOR TO SURGERY. MEN MAY SHAVE FACE AND NECK.  DO NOT BRING VALUABLES TO HOSPITAL. [facility] IS NOT RESPONSIBLE FOR VALUABLES.  CONTACTS, DENTURES OR PARTIALS MAY NOT BE WORN TO SURGERY. LEAVE SUITCASE IN CAR. CAN BE BROUGHT TO ROOM AFTER SURGERY.  PATIENTS DISCHARGED THE DAY OF SURGERY WILL NOT BE ALLOWED TO DRIVE HOME.  PLEASE READ OVER THE FOLLOWING INSTRUCTION SHEETS _________________________________________________________________________________                                          [place] - PREPARING FOR SURGERY  Before surgery, you can play an important role.  Because skin is not sterile, your skin needs to be as free of germs as possible.  You can reduce the number of germs on your skin by washing with CHG (chlorahexidine gluconate) soap before surgery.  CHG is an antiseptic cleaner which kills germs and bonds with the skin to continue killing germs even after washing. Please DO NOT use if you  have an allergy to CHG or antibacterial soaps.  If your skin becomes reddened/irritated stop using the CHG and inform your nurse when you arrive at Short Stay. Do not shave (including legs and underarms) for at least 48 hours prior to the first CHG shower.  You may shave your face. Please follow these instructions carefully:   1.  Shower with CHG Soap the night before surgery and the  morning of Surgery.   2.  If you choose to wash your hair, wash your hair first as usual with your  normal  Shampoo.   3.  After you shampoo, rinse your hair and body thoroughly to remove the  shampoo.                                         4.  Use CHG as you would any other liquid soap.  You can apply chg directly  to the skin and wash . Gently wash with scrungie or clean wascloth    5.  Apply the CHG Soap to your body ONLY FROM THE NECK DOWN.   Do not use on open  Wound or open sores. Avoid contact with eyes, ears mouth and genitals (private parts).                        Genitals (private parts) with your normal soap.              6.  Wash thoroughly, paying special attention to the area where your surgery  will be performed.   7.  Thoroughly rinse your body with warm water from the neck down.   8.  DO NOT shower/wash with your normal soap after using and rinsing off  the CHG Soap .                9.  Pat yourself dry with a clean towel.             10.  Wear clean night clothes to bed after shower             11.  Place clean sheets on your bed the night of your first shower and do not  sleep with pets.  Day of Surgery : Do not apply any lotions/deodorants the morning of surgery.  Please wear clean clothes to the hospital/surgery center.  FAILURE TO FOLLOW THESE INSTRUCTIONS MAY RESULT IN THE CANCELLATION OF YOUR SURGERY    PATIENT SIGNATURE_________________________________  ______________________________________________________________________

## 2015-08-14 ENCOUNTER — Telehealth: Payer: Self-pay | Admitting: *Deleted

## 2015-08-14 NOTE — Telephone Encounter (Signed)
Recommend nutrition eval so that he can discuss with nutritionist his weight loss and muscle bldg goal. He has co-morbidities that can be affected with choice of diet and exercise program as well as nutritional supplements

## 2015-08-14 NOTE — Telephone Encounter (Signed)
Spoke with patient and let him know that his medication had arrived from the company.

## 2015-08-15 ENCOUNTER — Encounter (HOSPITAL_COMMUNITY)
Admission: RE | Admit: 2015-08-15 | Discharge: 2015-08-15 | Disposition: A | Discharge status: Home / Self Care | Payer: PPO | Source: Ambulatory Visit | Attending: General Surgery | Admitting: General Surgery

## 2015-08-15 ENCOUNTER — Encounter (HOSPITAL_COMMUNITY): Payer: Self-pay

## 2015-08-15 DIAGNOSIS — K8020 Calculus of gallbladder without cholecystitis without obstruction: Secondary | ICD-10-CM | POA: Diagnosis not present

## 2015-08-15 DIAGNOSIS — Z01818 Encounter for other preprocedural examination: Secondary | ICD-10-CM | POA: Diagnosis not present

## 2015-08-15 HISTORY — DX: Calculus of gallbladder without cholecystitis without obstruction: K80.20

## 2015-08-15 LAB — COMPREHENSIVE METABOLIC PANEL
ALK PHOS: 61 U/L (ref 38–126)
ALT: 29 U/L (ref 17–63)
AST: 36 U/L (ref 15–41)
Albumin: 3.7 g/dL (ref 3.5–5.0)
Anion gap: 13 (ref 5–15)
BILIRUBIN TOTAL: 1.1 mg/dL (ref 0.3–1.2)
BUN: 37 mg/dL — ABNORMAL HIGH (ref 6–20)
CALCIUM: 9.3 mg/dL (ref 8.9–10.3)
CO2: 28 mmol/L (ref 22–32)
CREATININE: 1.3 mg/dL — AB (ref 0.61–1.24)
Chloride: 101 mmol/L (ref 101–111)
GFR calc non Af Amer: 60 mL/min — ABNORMAL LOW (ref >60)
Glucose, Bld: 153 mg/dL — ABNORMAL HIGH (ref 65–99)
Potassium: 4.1 mmol/L (ref 3.5–5.1)
SODIUM: 142 mmol/L (ref 135–145)
Total Protein: 8 g/dL (ref 6.5–8.1)

## 2015-08-15 LAB — CBC WITH DIFFERENTIAL/PLATELET
Basophils Absolute: 0 10*3/uL (ref 0.0–0.1)
Basophils Relative: 0 %
EOS ABS: 0.1 10*3/uL (ref 0.0–0.7)
Eosinophils Relative: 1 %
HEMATOCRIT: 44.3 % (ref 39.0–52.0)
HEMOGLOBIN: 14.5 g/dL (ref 13.0–17.0)
LYMPHS ABS: 1.1 10*3/uL (ref 0.7–4.0)
LYMPHS PCT: 12 %
MCH: 29.8 pg (ref 26.0–34.0)
MCHC: 32.7 g/dL (ref 30.0–36.0)
MCV: 91 fL (ref 78.0–100.0)
Monocytes Absolute: 0.3 10*3/uL (ref 0.1–1.0)
Monocytes Relative: 3 %
NEUTROS ABS: 7.8 10*3/uL — AB (ref 1.7–7.7)
NEUTROS PCT: 84 %
Platelets: 324 10*3/uL (ref 150–400)
RBC: 4.87 MIL/uL (ref 4.22–5.81)
RDW: 15.1 % (ref 11.5–15.5)
WBC: 9.4 10*3/uL (ref 4.0–10.5)

## 2015-08-15 LAB — PROTIME-INR
INR: 2.88 — AB (ref 0.00–1.49)
Prothrombin Time: 29.7 seconds — ABNORMAL HIGH (ref 11.6–15.2)

## 2015-08-15 NOTE — Telephone Encounter (Signed)
Patient stated that he will call his insurance company to see what is covered and set it up. He stated that he has already saw a nutritionist a couple years ago.

## 2015-08-15 NOTE — Progress Notes (Signed)
Abnormal CMET faxed to Dr. Rosenbower 

## 2015-08-23 MED ORDER — CEFAZOLIN SODIUM 10 G IJ SOLR
3.0000 g | INTRAMUSCULAR | Status: AC
  Administered 2015-08-24: 3 g via INTRAVENOUS
  Filled 2015-08-23: qty 3000

## 2015-08-24 ENCOUNTER — Ambulatory Visit (HOSPITAL_COMMUNITY): Payer: PPO

## 2015-08-24 ENCOUNTER — Ambulatory Visit (HOSPITAL_COMMUNITY): Payer: PPO | Admitting: Certified Registered Nurse Anesthetist

## 2015-08-24 ENCOUNTER — Other Ambulatory Visit: Payer: PPO

## 2015-08-24 ENCOUNTER — Encounter (HOSPITAL_COMMUNITY)
Admission: RE | Disposition: A | Discharge status: Home / Self Care | Payer: Self-pay | Source: Ambulatory Visit | Attending: General Surgery

## 2015-08-24 ENCOUNTER — Encounter (HOSPITAL_COMMUNITY): Payer: Self-pay | Admitting: *Deleted

## 2015-08-24 ENCOUNTER — Ambulatory Visit (HOSPITAL_COMMUNITY)
Admission: RE | Admit: 2015-08-24 | Discharge: 2015-08-24 | Disposition: A | Discharge status: Home / Self Care | Payer: PPO | Source: Ambulatory Visit | Attending: General Surgery | Admitting: General Surgery

## 2015-08-24 DIAGNOSIS — Z79899 Other long term (current) drug therapy: Secondary | ICD-10-CM | POA: Diagnosis not present

## 2015-08-24 DIAGNOSIS — F17290 Nicotine dependence, other tobacco product, uncomplicated: Secondary | ICD-10-CM | POA: Insufficient documentation

## 2015-08-24 DIAGNOSIS — K8010 Calculus of gallbladder with chronic cholecystitis without obstruction: Secondary | ICD-10-CM | POA: Diagnosis not present

## 2015-08-24 DIAGNOSIS — I10 Essential (primary) hypertension: Secondary | ICD-10-CM | POA: Diagnosis not present

## 2015-08-24 DIAGNOSIS — K219 Gastro-esophageal reflux disease without esophagitis: Secondary | ICD-10-CM | POA: Diagnosis not present

## 2015-08-24 DIAGNOSIS — G4730 Sleep apnea, unspecified: Secondary | ICD-10-CM | POA: Insufficient documentation

## 2015-08-24 DIAGNOSIS — I252 Old myocardial infarction: Secondary | ICD-10-CM | POA: Diagnosis not present

## 2015-08-24 DIAGNOSIS — Z6841 Body Mass Index (BMI) 40.0 and over, adult: Secondary | ICD-10-CM | POA: Insufficient documentation

## 2015-08-24 DIAGNOSIS — J45909 Unspecified asthma, uncomplicated: Secondary | ICD-10-CM | POA: Insufficient documentation

## 2015-08-24 DIAGNOSIS — K8020 Calculus of gallbladder without cholecystitis without obstruction: Secondary | ICD-10-CM

## 2015-08-24 DIAGNOSIS — G709 Myoneural disorder, unspecified: Secondary | ICD-10-CM | POA: Insufficient documentation

## 2015-08-24 DIAGNOSIS — M1990 Unspecified osteoarthritis, unspecified site: Secondary | ICD-10-CM | POA: Insufficient documentation

## 2015-08-24 DIAGNOSIS — I509 Heart failure, unspecified: Secondary | ICD-10-CM | POA: Diagnosis not present

## 2015-08-24 DIAGNOSIS — Z419 Encounter for procedure for purposes other than remedying health state, unspecified: Secondary | ICD-10-CM

## 2015-08-24 DIAGNOSIS — Z7901 Long term (current) use of anticoagulants: Secondary | ICD-10-CM | POA: Diagnosis not present

## 2015-08-24 HISTORY — PX: CHOLECYSTECTOMY: SHX55

## 2015-08-24 LAB — PROTIME-INR
INR: 1.12 (ref 0.00–1.49)
Prothrombin Time: 14.6 seconds (ref 11.6–15.2)

## 2015-08-24 LAB — GLUCOSE, CAPILLARY
GLUCOSE-CAPILLARY: 170 mg/dL — AB (ref 65–99)
Glucose-Capillary: 233 mg/dL — ABNORMAL HIGH (ref 65–99)

## 2015-08-24 SURGERY — LAPAROSCOPIC CHOLECYSTECTOMY WITH INTRAOPERATIVE CHOLANGIOGRAM
Anesthesia: General | Site: Abdomen

## 2015-08-24 MED ORDER — HYDROMORPHONE HCL 1 MG/ML IJ SOLN
INTRAMUSCULAR | Status: AC
  Filled 2015-08-24: qty 1

## 2015-08-24 MED ORDER — NEOSTIGMINE METHYLSULFATE 10 MG/10ML IV SOLN
INTRAVENOUS | Status: DC | PRN
  Administered 2015-08-24: 4 mg via INTRAVENOUS

## 2015-08-24 MED ORDER — LIDOCAINE HCL (CARDIAC) 20 MG/ML IV SOLN
INTRAVENOUS | Status: DC | PRN
  Administered 2015-08-24: 50 mg via INTRAVENOUS

## 2015-08-24 MED ORDER — ROCURONIUM BROMIDE 100 MG/10ML IV SOLN
INTRAVENOUS | Status: DC | PRN
  Administered 2015-08-24: 20 mg via INTRAVENOUS
  Administered 2015-08-24: 10 mg via INTRAVENOUS
  Administered 2015-08-24: 20 mg via INTRAVENOUS

## 2015-08-24 MED ORDER — HYDROMORPHONE HCL 1 MG/ML IJ SOLN
0.2500 mg | INTRAMUSCULAR | Status: DC | PRN
  Administered 2015-08-24 (×2): 0.5 mg via INTRAVENOUS

## 2015-08-24 MED ORDER — PROPOFOL 10 MG/ML IV BOLUS
INTRAVENOUS | Status: AC
  Filled 2015-08-24: qty 20

## 2015-08-24 MED ORDER — ACETAMINOPHEN 325 MG PO TABS: 650.0000 mg | ORAL_TABLET | ORAL | Status: DC | PRN

## 2015-08-24 MED ORDER — MIDAZOLAM HCL 2 MG/2ML IJ SOLN
INTRAMUSCULAR | Status: AC
  Filled 2015-08-24: qty 4

## 2015-08-24 MED ORDER — FENTANYL CITRATE (PF) 100 MCG/2ML IJ SOLN
INTRAMUSCULAR | Status: DC | PRN
  Administered 2015-08-24 (×5): 50 ug via INTRAVENOUS

## 2015-08-24 MED ORDER — FENTANYL CITRATE (PF) 250 MCG/5ML IJ SOLN
INTRAMUSCULAR | Status: AC
  Filled 2015-08-24: qty 25

## 2015-08-24 MED ORDER — BUPIVACAINE HCL 0.5 % IJ SOLN
INTRAMUSCULAR | Status: DC | PRN
  Administered 2015-08-24: 15 mL

## 2015-08-24 MED ORDER — SUCCINYLCHOLINE CHLORIDE 20 MG/ML IJ SOLN
INTRAMUSCULAR | Status: DC | PRN
  Administered 2015-08-24: 160 mg via INTRAVENOUS

## 2015-08-24 MED ORDER — IOHEXOL 300 MG/ML  SOLN
INTRAMUSCULAR | Status: DC | PRN
  Administered 2015-08-24: 6 mL

## 2015-08-24 MED ORDER — NEOSTIGMINE METHYLSULFATE 10 MG/10ML IV SOLN
INTRAVENOUS | Status: AC
  Filled 2015-08-24: qty 1

## 2015-08-24 MED ORDER — OXYCODONE HCL 5 MG PO TABS
5.0000 mg | ORAL_TABLET | ORAL | Status: DC | PRN
  Administered 2015-08-24: 10 mg via ORAL
  Filled 2015-08-24: qty 2

## 2015-08-24 MED ORDER — GLYCOPYRROLATE 0.2 MG/ML IJ SOLN
INTRAMUSCULAR | Status: AC
  Filled 2015-08-24: qty 3

## 2015-08-24 MED ORDER — PHENYLEPHRINE HCL 10 MG/ML IJ SOLN
INTRAMUSCULAR | Status: AC
  Filled 2015-08-24: qty 1

## 2015-08-24 MED ORDER — SODIUM CHLORIDE 0.9 % IV SOLN: 10000.0000 ug | INTRAVENOUS | Status: DC | PRN

## 2015-08-24 MED ORDER — ACETAMINOPHEN 650 MG RE SUPP: 650.0000 mg | RECTAL | Status: DC | PRN

## 2015-08-24 MED ORDER — MORPHINE SULFATE (PF) 10 MG/ML IV SOLN: 2.0000 mg | INTRAVENOUS | Status: DC | PRN

## 2015-08-24 MED ORDER — KCL IN DEXTROSE-NACL 20-5-0.45 MEQ/L-%-% IV SOLN: INTRAVENOUS | Status: DC

## 2015-08-24 MED ORDER — ONDANSETRON HCL 4 MG/2ML IJ SOLN
INTRAMUSCULAR | Status: AC
  Filled 2015-08-24: qty 2

## 2015-08-24 MED ORDER — SODIUM CHLORIDE 0.9 % IJ SOLN: 3.0000 mL | INTRAMUSCULAR | Status: DC | PRN

## 2015-08-24 MED ORDER — ONDANSETRON HCL 4 MG/2ML IJ SOLN
INTRAMUSCULAR | Status: DC | PRN
  Administered 2015-08-24: 4 mg via INTRAVENOUS

## 2015-08-24 MED ORDER — ONDANSETRON HCL 4 MG PO TABS: 4.0000 mg | ORAL_TABLET | ORAL | Status: DC | PRN

## 2015-08-24 MED ORDER — FENTANYL CITRATE (PF) 100 MCG/2ML IJ SOLN: 25.0000 ug | INTRAMUSCULAR | Status: DC | PRN

## 2015-08-24 MED ORDER — 0.9 % SODIUM CHLORIDE (POUR BTL) OPTIME
TOPICAL | Status: DC | PRN
  Administered 2015-08-24: 1000 mL

## 2015-08-24 MED ORDER — LACTATED RINGERS IR SOLN
Status: DC | PRN
  Administered 2015-08-24: 1

## 2015-08-24 MED ORDER — BUPIVACAINE HCL (PF) 0.5 % IJ SOLN
INTRAMUSCULAR | Status: AC
  Filled 2015-08-24: qty 30

## 2015-08-24 MED ORDER — GLYCOPYRROLATE 0.2 MG/ML IJ SOLN
INTRAMUSCULAR | Status: DC | PRN
  Administered 2015-08-24: .6 mg via INTRAVENOUS

## 2015-08-24 MED ORDER — LIDOCAINE HCL (CARDIAC) 20 MG/ML IV SOLN
INTRAVENOUS | Status: AC
  Filled 2015-08-24: qty 5

## 2015-08-24 MED ORDER — PROPOFOL 10 MG/ML IV BOLUS
INTRAVENOUS | Status: DC | PRN
  Administered 2015-08-24: 140 mg via INTRAVENOUS

## 2015-08-24 MED ORDER — PHENYLEPHRINE HCL 10 MG/ML IJ SOLN
INTRAMUSCULAR | Status: DC | PRN
  Administered 2015-08-24 (×2): 80 ug via INTRAVENOUS

## 2015-08-24 MED ORDER — OXYCODONE HCL 5 MG PO TABS: 5.0000 mg | ORAL_TABLET | ORAL | Status: DC | PRN

## 2015-08-24 MED ORDER — ROCURONIUM BROMIDE 100 MG/10ML IV SOLN
INTRAVENOUS | Status: AC
  Filled 2015-08-24: qty 1

## 2015-08-24 MED ORDER — PHENYLEPHRINE 40 MCG/ML (10ML) SYRINGE FOR IV PUSH (FOR BLOOD PRESSURE SUPPORT)
PREFILLED_SYRINGE | INTRAVENOUS | Status: AC
  Filled 2015-08-24: qty 10

## 2015-08-24 MED ORDER — SODIUM CHLORIDE 0.9 % IV SOLN
10000.0000 ug | INTRAVENOUS | Status: DC | PRN
  Administered 2015-08-24: 20 ug via INTRAVENOUS

## 2015-08-24 MED ORDER — MIDAZOLAM HCL 5 MG/5ML IJ SOLN
INTRAMUSCULAR | Status: DC | PRN
  Administered 2015-08-24: 2 mg via INTRAVENOUS

## 2015-08-24 MED ORDER — LACTATED RINGERS IV SOLN
INTRAVENOUS | Status: DC
  Administered 2015-08-24 (×2): 1000 mL via INTRAVENOUS

## 2015-08-24 MED ORDER — PROMETHAZINE HCL 25 MG/ML IJ SOLN: 6.2500 mg | INTRAMUSCULAR | Status: DC | PRN

## 2015-08-24 SURGICAL SUPPLY — 41 items
APL SKNCLS STERI-STRIP NONHPOA (GAUZE/BANDAGES/DRESSINGS) ×1
APPLIER CLIP 5 13 M/L LIGAMAX5 (MISCELLANEOUS) ×3
APR CLP MED LRG 5 ANG JAW (MISCELLANEOUS) ×1
BAG SPEC RTRVL LRG 6X4 10 (ENDOMECHANICALS) ×2
BENZOIN TINCTURE PRP APPL 2/3 (GAUZE/BANDAGES/DRESSINGS) ×3 IMPLANT
CATH REDDICK CHOLANGI 4FR 50CM (CATHETERS) ×3 IMPLANT
CHLORAPREP W/TINT 26ML (MISCELLANEOUS) ×3 IMPLANT
CLIP APPLIE 5 13 M/L LIGAMAX5 (MISCELLANEOUS) ×1 IMPLANT
CLOSURE WOUND 1/2 X4 (GAUZE/BANDAGES/DRESSINGS) ×1
COVER MAYO STAND STRL (DRAPES) ×3 IMPLANT
COVER SURGICAL LIGHT HANDLE (MISCELLANEOUS) ×3 IMPLANT
DECANTER SPIKE VIAL GLASS SM (MISCELLANEOUS) ×1 IMPLANT
DISSECTOR BLUNT TIP ENDO 5MM (MISCELLANEOUS) ×2 IMPLANT
DRAPE C-ARM 42X120 X-RAY (DRAPES) ×3 IMPLANT
DRAPE LAPAROSCOPIC ABDOMINAL (DRAPES) ×3 IMPLANT
DRSG TEGADERM 2-3/8X2-3/4 SM (GAUZE/BANDAGES/DRESSINGS) ×9 IMPLANT
ELECT REM PT RETURN 9FT ADLT (ELECTROSURGICAL) ×3
ELECTRODE REM PT RTRN 9FT ADLT (ELECTROSURGICAL) ×1 IMPLANT
ENDOLOOP SUT PDS II  0 18 (SUTURE)
ENDOLOOP SUT PDS II 0 18 (SUTURE) IMPLANT
GAUZE SPONGE 2X2 8PLY STRL LF (GAUZE/BANDAGES/DRESSINGS) ×1 IMPLANT
GLOVE ECLIPSE 8.0 STRL XLNG CF (GLOVE) ×3 IMPLANT
GLOVE INDICATOR 8.0 STRL GRN (GLOVE) ×3 IMPLANT
GOWN STRL REUS W/TWL XL LVL3 (GOWN DISPOSABLE) ×9 IMPLANT
HEMOSTAT SNOW SURGICEL 2X4 (HEMOSTASIS) ×2 IMPLANT
IV CATH 14GX2 1/4 (CATHETERS) ×3 IMPLANT
KIT BASIN OR (CUSTOM PROCEDURE TRAY) ×3 IMPLANT
POUCH SPECIMEN RETRIEVAL 10MM (ENDOMECHANICALS) ×5 IMPLANT
SCISSORS LAP 5X35 DISP (ENDOMECHANICALS) ×3 IMPLANT
SET IRRIG TUBING LAPAROSCOPIC (IRRIGATION / IRRIGATOR) ×3 IMPLANT
SLEEVE XCEL OPT CAN 5 100 (ENDOMECHANICALS) ×6 IMPLANT
SPONGE GAUZE 2X2 STER 10/PKG (GAUZE/BANDAGES/DRESSINGS) ×2
STRIP CLOSURE SKIN 1/2X4 (GAUZE/BANDAGES/DRESSINGS) ×2 IMPLANT
SUT MNCRL AB 4-0 PS2 18 (SUTURE) ×3 IMPLANT
SUT VIC AB 0 UR5 27 (SUTURE) ×4 IMPLANT
TOWEL OR 17X26 10 PK STRL BLUE (TOWEL DISPOSABLE) ×3 IMPLANT
TOWEL OR NON WOVEN STRL DISP B (DISPOSABLE) IMPLANT
TRAY LAPAROSCOPIC (CUSTOM PROCEDURE TRAY) ×3 IMPLANT
TROCAR BLADELESS OPT 5 100 (ENDOMECHANICALS) ×3 IMPLANT
TROCAR XCEL BLUNT TIP 100MML (ENDOMECHANICALS) ×3 IMPLANT
TROCAR XCEL NON-BLD 11X100MML (ENDOMECHANICALS) IMPLANT

## 2015-08-24 NOTE — Anesthesia Postprocedure Evaluation (Signed)
  Anesthesia Post-op Note  Patient: Jeremiah Perkins  Procedure(s) Performed: Procedure(s) (LRB): LAPAROSCOPIC CHOLECYSTECTOMY WITH INTRAOPERATIVE CHOLANGIOGRAM (N/A)  Patient Location: PACU  Anesthesia Type: General  Level of Consciousness: awake and alert   Airway and Oxygen Therapy: Patient Spontanous Breathing  Post-op Pain: mild  Post-op Assessment: Post-op Vital signs reviewed, Patient's Cardiovascular Status Stable, Respiratory Function Stable, Patent Airway and No signs of Nausea or vomiting  Last Vitals:  Filed Vitals:   08/24/15 1527  BP: 118/84  Pulse: 70  Temp: 36.6 C  Resp: 16    Post-op Vital Signs: stable   Complications: No apparent anesthesia complications

## 2015-08-24 NOTE — Anesthesia Procedure Notes (Signed)
Procedure Name: Intubation Date/Time: 08/24/2015 9:52 AM Performed by: Carolyne Fiscal, AMY F Pre-anesthesia Checklist: Patient identified, Emergency Drugs available, Suction available, Patient being monitored and Timeout performed Patient Re-evaluated:Patient Re-evaluated prior to inductionOxygen Delivery Method: Circle system utilized Preoxygenation: Pre-oxygenation with 100% oxygen Intubation Type: IV induction Ventilation: Mask ventilation without difficulty Laryngoscope Size: Mac and 4 Grade View: Grade I Tube type: Oral Tube size: 7.5 mm Number of attempts: 1 Airway Equipment and Method: Stylet Placement Confirmation: ETT inserted through vocal cords under direct vision,  positive ETCO2 and breath sounds checked- equal and bilateral Secured at: 24 cm Tube secured with: Tape Dental Injury: Teeth and Oropharynx as per pre-operative assessment

## 2015-08-24 NOTE — Op Note (Signed)
Operative Note  Jeremiah Perkins male 54 y.o. 08/24/2015  PREOPERATIVE DX:  Symptomatic cholelithiasis  POSTOPERATIVE DX:  Same + chronic cholecystitis  PROCEDURE:   Laparoscopic cholecystectomy with cholangiogram         Surgeon: [name]   Assistants: Darnell Level M.D.  Anesthesia: General endotracheal anesthesia  Indications:   This is a 54 year old male with known cholelithiasis that been symptomatic for a long time. He tried managing this with diet but continues to have the symptoms. He now presents for the above procedure. He was seen by his cardiologist preoperatively who felt it was safe for him to undergo the operation.    Procedure Detail:  He was brought to the operating room placed supine on the operating table and a general anesthetic was given. Hair on the abdominal wall was clipped. The abdominal wall was widely sterilely prepped and draped.  Local anesthetic consisting of Marcaine was infiltrated just superior to the umbilicus. A small incision was made through the skin and subcutaneous tissue just superior to the umbilicus. A small incision was made in the fascia and peritoneum entered the peritoneal cavity under direct vision. A pursestring suture of 0 Vicryl was placed around the edges of the fascia. A Hassan trocar is introduced into the peritoneal cavity and a pneumoperitoneum created by insufflation of CO2 gas. Inspection of the area under the trocar demonstrated no evidence of bleeding or organ injury.  He was placed in reverse Trendelenburg position with right side tilted slightly up. A 5 mm trocar is placed in the epigastric region. A 5 mm trocar was placed in the mid right upper quadrant region. A 5 mm trocar was  placed in the lateral right upper quadrant position.  Because of his morbid obesity, a 30 laparoscope was used for better visualization.  The gallbladder was very firm with gross chronic inflammatory changes noted. Adhesions between the  omentum and the gallbladder were separated bluntly. With dissection on the gallbladder, the infundibulum was carefully mobilized. The cystic duct was identified and a window created around it. A cholangiocatheter was placed at the junction of the neck of the gallbladder and the cystic duct. A small incision was made in the cystic duct. A small gallstone was milked out of the cystic duct and removed from the abdominal cavity. A cholangiocatheter was passed through the abdominal wall and placed into the cystic duct. A cholangiogram was then performed.  Dilute contrast was injected into the cystic duct with live fluoroscopy. The common hepatic, right and left hepatic, and common bile ducts all promptly filled. Contrast promptly drained into the duodenum. There was no obvious evidence of obstruction.  The cholangiogram was reviewed by the radiologist who also did not see a common bile duct obstruction or filling defect.  The Cholangiocath was removed, the cystic duct was then clipped 3 times on the biliary side and divided.  An anterior branch of the cystic artery was identified close to the gallbladder. It was isolated, clipped, and divided. A small posterior branch was also isolated clipped and divided. I then dissected the gallbladder free from the liver using electrocautery. 2 small defects were made in the gallbladder with minimal spillage of bile. Once the gallbladder was freed from the liver, it was placed and a retrieval bag. The gallbladder fossa was then irrigated and bleeding was controlled with electrocautery.  The gallbladder was then removed in the retrieval bag through the subumbilical port after lengthening the fascial incision. Following this, the gallbladder fossa was once  again inspected and there were 2 small bleeding points controlled electrocautery. Irrigation was performed. There was no further bleeding. There is no bile leak. Surgicel was placed in the gallbladder fossa.  The defect in  the fascia above the umbilicus was then closed by tightening up and tying down the pursestring suture as well as using interrupted 0 Vicryl sutures. Laparoscopy was performed and the closure was solid. As no evidence of bleeding or bile leak. Trocars were removed and the pneumoperitoneum was released.  Skin incisions were closed with 4-0 Monocryl subcuticular stitches. Steri-Strips and sterile dressings were applied.  He tolerated the procedure well without any apparent complications and was taken to the recovery room in satisfactory condition.   Estimated Blood Loss:  200 mL                Specimens: gallbladder        Complications:  * No complications entered in OR log *         Disposition: PACU - hemodynamically stable.         Condition: stable

## 2015-08-24 NOTE — Transfer of Care (Signed)
Immediate Anesthesia Transfer of Care Note  Patient: Jeremiah Perkins  Procedure(s) Performed: Procedure(s): LAPAROSCOPIC CHOLECYSTECTOMY WITH INTRAOPERATIVE CHOLANGIOGRAM (N/A)  Patient Location: PACU  Anesthesia Type:General  Level of Consciousness: awake, alert  and oriented  Airway & Oxygen Therapy: Patient Spontanous Breathing and Patient connected to face mask oxygen  Post-op Assessment: Report given to RN and Post -op Vital signs reviewed and stable  Post vital signs: Reviewed and stable  Last Vitals:  Filed Vitals:   08/24/15 0811  BP: 128/93  Pulse: 63  Temp: 36.6 C  Resp: 16    Complications: No apparent anesthesia complications

## 2015-08-24 NOTE — Discharge Instructions (Signed)
LAPAROSCOPIC SURGERY: POST OP INSTRUCTIONS  1. DIET: Follow a light bland diet the first 24 hours after arrival home, such as soup, liquids, crackers, etc.  Be sure to include lots of fluids daily.  Avoid fast food or heavy meals as your are more likely to get nauseated.  Eat a low fat the next few days after surgery.   2. Take your usually prescribed home medications unless otherwise directed. 3. PAIN CONTROL: a. Pain is best controlled by a usual combination of three different methods TOGETHER: i. Ice/Heat ii. Over the counter pain medication iii. Prescription pain medication b. Most patients will experience some swelling and bruising around the incisions.  Ice packs or heating pads (30-60 minutes up to 6 times a day) will help. Use ice for the first few days to help decrease swelling and bruising, then switch to heat to help relax tight/sore spots and speed recovery.  Some people prefer to use ice alone, heat alone, alternating between ice & heat.  Experiment to what works for you.  Swelling and bruising can take several weeks to resolve.   c. It is helpful to take an over-the-counter pain medication regularly for the first few weeks.  Choose one of the following that works best for you: i. Naproxen (Aleve, etc)  Two 220mg  tabs twice a day ii. Ibuprofen (Advil, etc) Three 200mg  tabs four times a day (every meal & bedtime) iii. Acetaminophen (Tylenol, etc) 500-650mg  four times a day (every meal & bedtime) d. A  prescription for pain medication (such as oxycodone, hydrocodone, etc) should be given to you upon discharge.  Take your pain medication as prescribed.  i. If you are having problems/concerns with the prescription medicine (does not control pain, nausea, vomiting, rash, itching, etc), please call us [phone] to see if we need to switch you to a different pain medicine that will work better for you and/or control your side effect better. ii. If you need a refill on your pain medication,  please contact your pharmacy.  They will contact our office to request authorization. Prescriptions will not be filled after 5 pm or on week-ends. 4. Avoid getting constipated.  Between the surgery and the pain medications, it is common to experience some constipation.  Increasing fluid intake and taking a fiber supplement (such as Metamucil, Citrucel, FiberCon, MiraLax, etc) 1-2 times a day regularly will usually help prevent this problem from occurring.  A mild laxative (prune juice, Milk of Magnesia, MiraLax, etc) should be taken according to package directions if there are no bowel movements after 48 hours.   5. Watch out for diarrhea.  If you have many loose bowel movements, simplify your diet to bland foods & liquids for a few days.  Stop any stool softeners and decrease your fiber supplement.  Switching to mild anti-diarrheal medications (Kayopectate, Pepto Bismol) can help.  If this worsens or does not improve, please call us. 6. Wash / shower every day.  You may shower over the dressings as they are waterproof.  Continue to shower over incision(s) after the dressing is off. 7. Remove your waterproof bandages 3 days after surgery. Leave Steri-Strips on. They will fall off by themselves. You may leave the incision open to air.  You may replace a dressing/Band-Aid to cover the incision for comfort if you wish.  8. ACTIVITIES as tolerated:   a. You may resume regular (light) daily activities beginning the next day--such as daily self-care, walking, climbing stairs--gradually increasing light activities as tolerated.  No heavy lifting (over 10 pounds), straining, or intense activities for 3 weeks. b. DO NOT PUSH THROUGH PAIN.  Let pain be your guide: If it hurts to do something, don't do it.  Pain is your body warning you to avoid that activity for another week until the pain goes down. c. You may drive when you are no longer taking prescription pain medication, you can comfortably wear a seatbelt, and  you can safely maneuver your car and apply brakes. d. Bonita Quin may have sexual intercourse when it is comfortable.  9. FOLLOW UP in our office a. Please call [facility] at [phone] to set up an appointment to see your surgeon in the office for a follow-up appointment approximately 2-3 weeks after your surgery. b. Make sure that you call for this appointment the day you arrive home to insure a convenient appointment time. 10. IF YOU HAVE DISABILITY OR FAMILY LEAVE FORMS, BRING THEM TO THE OFFICE FOR PROCESSING.  DO NOT GIVE THEM TO YOUR DOCTOR.  11.  Return to work/school:  Desk work/light activities in 5-7 days, full duty/activities in 2 weeks if pain-free.  12.  Restart Xarelto on October 2nd.   WHEN TO CALL us [phone]: 1. Poor pain control 2. Reactions / problems with new medications (rash/itching, nausea, etc)  3. Fever over 101.5 F (38.5 C) 4. Inability to urinate 5. Nausea and/or vomiting 6. Worsening swelling or bruising 7. Continued bleeding from incision. 8. Increased pain, redness, or drainage from the incision   The clinic staff is available to answer your questions during regular business hours (8:30am-5pm).  Please dont hesitate to call and ask to speak to one of our nurses for clinical concerns.   If you have a medical emergency, go to the nearest emergency room or call 911.  A surgeon from [facility] is always on call at the [facility], [place] [address], [address], [place], [place]  [address] ? MAIN: [phone] ? TOLL FREE: [phone] ?  FAX [phone] [email or web address]

## 2015-08-24 NOTE — Anesthesia Preprocedure Evaluation (Addendum)
Anesthesia Evaluation  Patient identified by MRN, date of birth, ID band Patient awake  General Assessment Comment: Hypertension  . Congestive heart failure    No echo data available. Myocardial perfusion 2005 with EF 61%, presumed diastolic HF  . Diverticulosis    with history of diverticulitis in 10/2011 . Gout  . Major depression  . Anxiety  . Psychosexual dysfunction with inhibited sexual excitement  . Hyperlipidemia  . Atrial flutter  . Morbid obesity  . Chronic bronchitis    "get it q yr" (01/02/2015) . Shortness of breath    "all the time" (07/26/2013) . Chronic back pain  . GERD (gastroesophageal reflux disease)  . Asthma  . Myocardial infarction    "one doctor said I did back in 2014" . Pneumonia 2013; 2014 . OSA (obstructive sleep apnea) 1998   "wear my CPAP a couple times/month" (01/02/2015) . Diabetic peripheral neuropathy  . Type II diabetes mellitus dx'd 1991   previously on insulin in 2000 for 3-4 years (but was stopped because adamantly did not want to be on insulin) . Arthritis    involving knees, ankles, back, elbows (01/02/2015) . Gout        Reviewed: Allergy & Precautions, NPO status , Patient's Chart, lab work & pertinent test results  Airway Mallampati: II  TM Distance: >3 FB Neck ROM: Full    Dental  (+) Dental Advisory Given, Poor Dentition,    Pulmonary shortness of breath, asthma , sleep apnea , pneumonia, resolved, Current Smoker,    Pulmonary exam normal breath sounds clear to auscultation       Cardiovascular hypertension, Pt. on medications and Pt. on home beta blockers + Past MI and +CHF  Normal cardiovascular exam Rhythm:Irregular Rate:Normal  EF 35%  On ECHO in 2015.  Last cardiology office visit of 07-31-15 reviewed. H/O very reduced EF in the past. Clearance given.  In ER on 07-23-15 with fluid  overload.    Neuro/Psych PSYCHIATRIC DISORDERS Anxiety Depression  Neuromuscular disease    GI/Hepatic Neg liver ROS, GERD  Medicated,  Endo/Other  diabetes, Type 2, Oral Hypoglycemic AgentsHypothyroidism Morbid obesity  Renal/GU Renal disease  negative genitourinary   Musculoskeletal  (+) Arthritis ,   Abdominal (+) + obese,   Peds negative pediatric ROS (+)  Hematology negative hematology ROS (+)   Anesthesia Other Findings   Reproductive/Obstetrics negative OB ROS                         Anesthesia Physical Anesthesia Plan  ASA: IV  Anesthesia Plan: General   Post-op Pain Management:    Induction: Intravenous  Airway Management Planned: Oral ETT  Additional Equipment:   Intra-op Plan:   Post-operative Plan: Extubation in OR and Possible Post-op intubation/ventilation  Informed Consent: I have reviewed the patients History and Physical, chart, labs and discussed the procedure including the risks, benefits and alternatives for the proposed anesthesia with the patient or authorized representative who has indicated his/her understanding and acceptance.   Dental advisory given  Plan Discussed with: CRNA  Anesthesia Plan Comments: (Discussed possible postoperative intubation with patient. Discussed increased risk for cardiopulmonary complication with patient and he voices understanding. Questions answered. He wishes to proceed.)       Anesthesia Quick Evaluation

## 2015-08-24 NOTE — H&P (View-Only) (Signed)
Jeremiah Perkins 07/25/2015 9:13 AM DOB: 11/06/1961 Married / Language: English / Race: Black or African American Male  History of Present Illness (Angel Hobdy J. Jaylenn Baiza MD; 07/25/2015 9:49 AM) Patient words: gallbladder.  The patient is a 54 year old male   Note:He presents today because he is continuing to have symptoms from his cholelithiasis. He is tried diet control but has multiple episodes of biliary colic. He actually went to the emergency department last week. He is interested in proceeding with cholecystectomy. He has recently been treated for an exacerbation of his congestive heart failure.    Allergies  Sulfa Antibiotics  Medication History  Albuterol Sulfate (108 (90 Base)MCG/ACT Aero Pow Br Act, Inhalation) Active. Carvedilol (6.25MG Tablet, Oral) Active. Colchicine (0.6MG Capsule, Oral) Active. Gabapentin (600MG Tablet, Oral) Active. Xopenex HFA (45MCG/ACT Aerosol, Inhalation) Active. Losartan Potassium (25MG Tablet, Oral) Active. MetFORMIN HCl (1000MG Tablet, Oral) Active. Sildenafil Citrate (100MG Tablet, Oral) Active. Spironolactone (25MG Tablet, Oral) Active. Torsemide (20MG Tablet, Oral) Active. Xarelto (20MG Tablet, Oral) Active. Medications Reconciled  Review of Systems  Note: No fevers with the episodes.  Physical Exam   The physical exam findings are as follows: Note:General-morbidly obese male in no acute distress.  Eyes-no icterus.  Cardiovascular-regular rate and rhythm, no JVD.  Lungs-clear.  Abdomen-soft, massively obese, no right upper quadrant or epigastric tenderness.    Assessment & Plan SYMPTOMATIC CHOLELITHIASIS (574.20  K80.20) Impression: He has failed conservative management. He is interested in having the surgery. Had a recent episode of congestive heart failure and will be seeing Dr. Brackbill for that next week.  Plan: Schedule laparoscopic cholecystectomy.  He is felt to be an acceptable risk from a  cardiac standpoint. We went over the procedure and risks again in detail as we did before.   Taralyn Ferraiolo, MD 

## 2015-08-24 NOTE — Interval H&P Note (Signed)
History and Physical Interval Note:  08/24/2015 9:41 AM  Jeremiah Perkins  has presented today for surgery, with the diagnosis of Cholelithiasis  The various methods of treatment have been discussed with the patient and family. After consideration of risks, benefits and other options for treatment, the patient has consented to  Procedure(s): LAPAROSCOPIC CHOLECYSTECTOMY WITH INTRAOPERATIVE CHOLANGIOGRAM (N/A) as a surgical intervention .  The patient's history has been reviewed, patient examined, no change in status, stable for surgery.  I have reviewed the patient's chart and labs.  Questions were answered to the patient's satisfaction.     ROSENBOWER,TODD Shela Commons

## 2015-08-31 ENCOUNTER — Other Ambulatory Visit: Payer: PPO

## 2015-08-31 DIAGNOSIS — Z125 Encounter for screening for malignant neoplasm of prostate: Secondary | ICD-10-CM

## 2015-08-31 LAB — BASIC METABOLIC PANEL
BUN: 32 mg/dL — AB (ref 4–21)
CREATININE: 1.1 mg/dL (ref 0.6–1.3)
Glucose: 173 mg/dL
Potassium: 4.7 mmol/L (ref 3.4–5.3)
Sodium: 140 mmol/L (ref 137–147)

## 2015-08-31 LAB — CBC AND DIFFERENTIAL
HEMATOCRIT: 41 % (ref 41–53)
Hemoglobin: 13.5 g/dL (ref 13.5–17.5)
PLATELETS: 235 10*3/uL (ref 150–399)
WBC: 7.7 10*3/mL

## 2015-08-31 LAB — HEPATIC FUNCTION PANEL
ALT: 27 U/L (ref 10–40)
AST: 29 U/L (ref 14–40)
Alkaline Phosphatase: 82 U/L (ref 25–125)
Bilirubin, Total: 0.7 mg/dL

## 2015-09-01 LAB — PSA: PROSTATE SPECIFIC AG, SERUM: 1 ng/mL (ref 0.0–4.0)

## 2015-09-03 ENCOUNTER — Telehealth: Payer: Self-pay | Admitting: *Deleted

## 2015-09-03 NOTE — Telephone Encounter (Signed)
Patient called and wanted to know if he could have a refill on his Oxycodone. Just had #40 refilled on 9/30 by surgeon. Please Advise.

## 2015-09-03 NOTE — Telephone Encounter (Signed)
Patient notified and will call surgeon

## 2015-09-03 NOTE — Telephone Encounter (Signed)
No - he needs to contact sx

## 2015-09-05 ENCOUNTER — Telehealth: Payer: Self-pay | Admitting: Cardiology

## 2015-09-05 NOTE — Telephone Encounter (Signed)
Pt c/o BP issue: STAT if pt c/o blurred vision, one-sided weakness or slurred speech  1. What are your last 5 BP readings?  80/50  2. Are you having any other symptoms (ex. Dizziness, headache, blurred vision, passed out)? No  3. What is your BP issue? Pt went to see his rheumatology doctor and was told to call office concerning his low bp.

## 2015-09-05 NOTE — Telephone Encounter (Signed)
Spoke with patient and he was at a doctors appointment this am and blood pressure 80/50 with the doctor taking blood pressure Patient is asymptomatic, reviewed medications  Discussed with  Dr. Patty Sermons and will have patient decrease his Losartan to 25 mg 1/2 tablet daily and get blood pressure monitor and check at home if systolic remains below 100 call back if not call with update in couple of weeks Advised patient, verbalized understanding

## 2015-09-06 ENCOUNTER — Encounter: Payer: Self-pay | Admitting: *Deleted

## 2015-09-13 ENCOUNTER — Telehealth: Payer: Self-pay

## 2015-09-13 NOTE — Telephone Encounter (Signed)
Called the patients insurance to find out what is needed to reorder his supplies for his CPAP Left message on machine for patient to return call when available

## 2015-09-21 ENCOUNTER — Telehealth: Payer: Self-pay

## 2015-09-21 NOTE — Telephone Encounter (Signed)
Patient left message on triage voice mail 09/20/15 at 4:51. Returned his call 09/21/15 - he had called last week about someone to  call his Insurance for his CPap and no one called back. In notes Kennon Rounds had left him a message 09/13/15 for him to call her, she had called his insurance. I will leave message for Kennon Rounds to call him back at [phone].

## 2015-09-24 ENCOUNTER — Telehealth: Payer: Self-pay

## 2015-09-24 NOTE — Telephone Encounter (Signed)
Patient came to office to find out about his c-pap supplies told him I still had not heard from his insurance. We got in touch with some one named [place] at his insurance agency  and she faxed me paper work to fill out for his supplies

## 2015-09-25 ENCOUNTER — Telehealth: Payer: Self-pay | Admitting: *Deleted

## 2015-09-25 NOTE — Telephone Encounter (Signed)
Patient called and stated that the pain medication he is currently taking is not controlling his pain and would like something different. Informed patient he would need to make an appointment and follow up with Dr. Montez Morita. I offered him an appointment for tomorrow but he doesn't have money for copayment and will call next week to schedule.

## 2015-09-25 NOTE — Telephone Encounter (Signed)
Filled out the papers that were sent to me by his insurance put them in file for Dr. Montez Morita to review and add any comments if she has any.

## 2015-09-27 ENCOUNTER — Other Ambulatory Visit: Payer: Self-pay | Admitting: Internal Medicine

## 2015-09-28 ENCOUNTER — Telehealth: Payer: Self-pay

## 2015-09-28 NOTE — Telephone Encounter (Signed)
The patient should get his diabetes medicines from his PCP

## 2015-09-28 NOTE — Telephone Encounter (Signed)
Patient's pharmacy is requesting a refill of Metformin HCL 1000 mg tab.

## 2015-09-28 NOTE — Telephone Encounter (Signed)
Called patient to let him know that I had faxed the paper work for his C-PAP supplies. Faxed to [employer] [phone] )

## 2015-09-29 ENCOUNTER — Other Ambulatory Visit: Payer: Self-pay | Admitting: Internal Medicine

## 2015-10-05 ENCOUNTER — Encounter: Payer: Self-pay | Admitting: Cardiology

## 2015-10-08 ENCOUNTER — Other Ambulatory Visit: Payer: Self-pay | Admitting: Internal Medicine

## 2015-10-09 ENCOUNTER — Other Ambulatory Visit: Payer: Self-pay | Admitting: *Deleted

## 2015-10-09 ENCOUNTER — Other Ambulatory Visit: Payer: Self-pay

## 2015-10-09 ENCOUNTER — Other Ambulatory Visit: Payer: Self-pay | Admitting: Internal Medicine

## 2015-10-09 MED ORDER — METFORMIN HCL 1000 MG PO TABS: 1000.0000 mg | ORAL_TABLET | Freq: Every day | ORAL | Status: DC

## 2015-10-09 NOTE — Telephone Encounter (Signed)
Patient requested to be faxed to pharmacy 

## 2015-10-24 ENCOUNTER — Ambulatory Visit: Payer: PPO | Admitting: Cardiology

## 2015-10-30 ENCOUNTER — Telehealth: Payer: Self-pay | Admitting: Cardiology

## 2015-10-30 NOTE — Telephone Encounter (Signed)
New message      Calling to let the nurse know he will bring a form by to be completed so that we can get his viagra from [employer].

## 2015-11-01 NOTE — Telephone Encounter (Signed)
Patient left message on refill voicemail to hear back from [place], Dr. Yevonne Pax nurse about some papers he left up front for her and Dr. Patty Sermons to fill out for his rx of [place]. He would like a call back from [place] at her earliest convenience on the subject.  I will forward this message to [place].

## 2015-11-01 NOTE — Telephone Encounter (Signed)
Follow up      Calling to see if you received the form for [employer] and when can he expect the form to be completed.  I told the patient that Dr Yevonne Pax nurse is working at another office but he insist that she call

## 2015-11-02 ENCOUNTER — Ambulatory Visit (INDEPENDENT_AMBULATORY_CARE_PROVIDER_SITE_OTHER): Payer: PPO | Admitting: Internal Medicine

## 2015-11-02 ENCOUNTER — Encounter: Payer: Self-pay | Admitting: Internal Medicine

## 2015-11-02 VITALS — BP 116/76 | HR 90 | Temp 98.1°F | Resp 20 | Ht 67.0 in | Wt 287.6 lb

## 2015-11-02 DIAGNOSIS — I5022 Chronic systolic (congestive) heart failure: Secondary | ICD-10-CM

## 2015-11-02 DIAGNOSIS — I4892 Unspecified atrial flutter: Secondary | ICD-10-CM

## 2015-11-02 DIAGNOSIS — E118 Type 2 diabetes mellitus with unspecified complications: Secondary | ICD-10-CM

## 2015-11-02 DIAGNOSIS — N529 Male erectile dysfunction, unspecified: Secondary | ICD-10-CM

## 2015-11-02 DIAGNOSIS — J45909 Unspecified asthma, uncomplicated: Secondary | ICD-10-CM | POA: Insufficient documentation

## 2015-11-02 DIAGNOSIS — I10 Essential (primary) hypertension: Secondary | ICD-10-CM

## 2015-11-02 DIAGNOSIS — M1A00X0 Idiopathic chronic gout, unspecified site, without tophus (tophi): Secondary | ICD-10-CM | POA: Diagnosis not present

## 2015-11-02 DIAGNOSIS — J4530 Mild persistent asthma, uncomplicated: Secondary | ICD-10-CM

## 2015-11-02 DIAGNOSIS — M1990 Unspecified osteoarthritis, unspecified site: Secondary | ICD-10-CM

## 2015-11-02 MED ORDER — OXYCODONE HCL 5 MG PO TABS: 5.0000 mg | ORAL_TABLET | ORAL | Status: DC | PRN

## 2015-11-02 NOTE — Progress Notes (Signed)
Patient ID: Jeremiah Perkins, male   DOB: 10-01-1961, 54 y.o.   MRN: [id number]    Location:    [facility]   Place of Service:   OFFICE  Chief Complaint  Patient presents with  . Medical Management of Chronic Issues    3 month follow-up for DM, Hypertension  . Immunizations    Discuss prevnar 13    HPI:  55 yo male seen today for f/u.  HTN/CHF/aflutter - rate controlled on coreg. He also takes losartan and demadex. Followed by cardio  Depression/anxiety - mood stable   Hyperlipidemia - diet controlled  Asthma/OSA - stable on xopenex HFA. He uses it 1x daily  GERD - stable. He takes prn tums and zofran. He has frequent loose stools since his gallbladder surgery. He goes for BM about 46mn after meals  DM/neuropathy - BS at home stable. No low BS reactions. He takes metformin. gabapentin helps neuropathy  Arthritis/gout - pain uncontrolled. He takes roxicodone and uses voltaren gel. Occasional gout attacks. He no longer takes prednisone as it was causing slow wound healing  ED - he has difficulty sustaining an erection even with Viagra. He states it is beginning to interfere with QOL and causing unnecessary friction with wife. Prostate was enlarged on last OV exam  Past Medical History  Diagnosis Date  . Hypertension   . Congestive heart failure ([facility]     No echo data available. Myocardial perfusion 2005 with EF 61%, presumed diastolic HF   . Diverticulosis     with history of diverticulitis in 10/2011  . Major depression ([facility]   . Anxiety   . Psychosexual dysfunction with inhibited sexual excitement   . Hyperlipidemia   . Atrial flutter ([facility]   . Morbid obesity ([facility]   . Chronic bronchitis ([facility]     "get it q yr" (01/02/2015)  . Shortness of breath     "all the time" (07/26/2013)  . Chronic back pain   . GERD (gastroesophageal reflux disease)   . Asthma   . Pneumonia 2013; 2014  . OSA (obstructive sleep apnea) 1998    "wear my CPAP a couple times/month" (01/02/2015)  .  Diabetic peripheral neuropathy ([facility]   . Type II diabetes mellitus ([facility] dx'd 1991    previously on insulin in 2000 for 3-4 years (but was stopped because adamantly did not want to be on insulin)  . Arthritis     involving knees, ankles, back, elbows (01/02/2015)  . Gout   . Myocardial infarction ([facility]     "one doctor said I did back in 2014"  . Cholelithiasis     Past Surgical History  Procedure Laterality Date  . Ankle reconstruction Right 1990's    2/2 trauma sustained after fall  . 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 Cyst removal leg Left 1960's    back of  leg  . Tee without cardioversion N/A 07/28/2013    Procedure: TRANSESOPHAGEAL ECHOCARDIOGRAM (TEE);  Surgeon: DLarey Dresser MD;  [facility]  Service: Cardiovascular;  Laterality: N/A;  . Cardioversion N/A 07/28/2013    Procedure: CARDIOVERSION;  Surgeon: DLarey Dresser MD;  Location: [facility]  Service: Cardiovascular;  Laterality: N/A;  . Cardioversion N/A 10/26/2013    Procedure: CARDIOVERSION;  Surgeon: TDarlin Coco MD;  [facility]: [facility]  Service: Cardiovascular;  Laterality: N/A;  . Left heart catheterization with coronary angiogram N/A 07/27/2013    Procedure: LEFT HEART CATHETERIZATION WITH CORONARY ANGIOGRAM;  Surgeon: PCollier Salina  M Martinique, MD;  Location: [facility] CATH LAB;  Service: Cardiovascular;  Laterality: N/A;  . Cardiac catheterization  1990's; 07/2013  . Finger amputation    . Cholecystectomy N/A 08/24/2015    Procedure: LAPAROSCOPIC CHOLECYSTECTOMY WITH INTRAOPERATIVE CHOLANGIOGRAM;  Surgeon: Jackolyn Confer, MD;  Location: [facility];  Service: General;  Laterality: N/A;    Patient Care Team: [name], DO as PCP - General (Internal Medicine) [name], MD as Consulting Physician (Cardiology)  Social History   Social History  . Marital Status: Married    Spouse Name: N/A  . Number of Children: 3  . Years of Education: 14   Occupational History  . Now unemployed     Forensic scientist at [employer]  .  [employer]   Social History Main Topics  . Smoking status: Former Smoker -- 0.12 packs/day for 10 years    Types: Cigarettes  . Smokeless tobacco: Never Used     Comment: rarely.  . Alcohol Use: 0.0 oz/week    0 Standard drinks or equivalent per week     Comment: OCCASIONAL  . Drug Use: No     Comment: LAST USED DEC 2015  . Sexual Activity: Not on file   Other Topics Concern  . Not on file   Social History Narrative   Previously worked as Conservation officer, nature and lives at home with his wife. They each have children but none between the two of them.      Diet:      Do you drink/ eat things with caffeine?  Yes      Marital status:   Married                             What year were you married ? 2010      Do you live in a house, apartment,assistred living, condo, trailer, etc.)? House      Is it one or more stories? Yes      How many persons live in your home ? 3      Do you have any pets in your home ?(please list) Dog      Current or past profession: Aircraft Mechanic/Instructor      Do you exercise?  Yes                            Type & how often:  Golf,cut grass, walk      Do you have a living will? No      Do you have a DNR form?   No                    If not, do you want to discuss one?       Do you have signed POA?HPOA forms?   No              If so, please bring to your        appointment           reports that he has quit smoking. His smoking use included Cigarettes. He has a 1.2 pack-year smoking history. He has never used smokeless tobacco. He reports that he drinks alcohol. He reports that he does not use illicit drugs.  Allergies  Allergen Reactions  . Eggs Or Egg-Derived Products Nausea And Vomiting    Did ok with flu shot, seems to be an issue with the  type of preparation of egg product.  . Sulfa Antibiotics Nausea And Vomiting    Medications: Patient's Medications  New Prescriptions   No medications on file  Previous  Medications   ALBUTEROL (PROAIR HFA) 108 (90 BASE) MCG/ACT INHALER    Inhale 2 puffs into the lungs every 4 (four) hours as needed for wheezing or shortness of breath. Inhale 1-2 puff every 4-6 hours   CALCIUM CARBONATE (TUMS - DOSED IN MG ELEMENTAL CALCIUM) 500 MG CHEWABLE TABLET    Chew 1 tablet by mouth as needed for indigestion or heartburn.   CARVEDILOL (COREG) 6.25 MG TABLET    Take 1 tablet (6.25 mg total) by mouth 2 (two) times daily.   COLCHICINE 0.6 MG TABLET    Take one tablet by mouth once daily for gout prevention   DICLOFENAC SODIUM (VOLTAREN) 1 % GEL    Apply 2 g topically 4 (four) times daily.   GABAPENTIN (NEURONTIN) 600 MG TABLET    Take 682m in the am & 12079min the pm for pains   LEVALBUTEROL (XOPENEX HFA) 45 MCG/ACT INHALER    Inhale 1-2 puffs into the lungs every 4 (four) hours as needed for wheezing.   LOSARTAN (COZAAR) 25 MG TABLET    Take 1 tablet (25 mg total) by mouth daily.   METFORMIN (GLUCOPHAGE) 1000 MG TABLET    Take 1 tablet (1,000 mg total) by mouth daily with breakfast.   ONDANSETRON (ZOFRAN ODT) 8 MG DISINTEGRATING TABLET    Take 1 tablet (8 mg total) by mouth every 8 (eight) hours as needed for nausea or vomiting.   ONDANSETRON (ZOFRAN) 4 MG TABLET    Take 1 tablet (4 mg total) by mouth every 4 (four) hours as needed for nausea.   OXYCODONE (OXY IR/ROXICODONE) 5 MG IMMEDIATE RELEASE TABLET    Take 1-2 tablets (5-10 mg total) by mouth every 4 (four) hours as needed for moderate pain, severe pain or breakthrough pain.   PREDNISONE (DELTASONE) 10 MG TABLET    Take 10 mg by mouth daily with breakfast.   SILDENAFIL (VIAGRA) 100 MG TABLET    Take 100 mg by mouth daily as needed for erectile dysfunction.   TORSEMIDE (DEMADEX) 20 MG TABLET    Take 20 mg by mouth as directed. Take 2 daily  Modified Medications   No medications on file  Discontinued Medications   No medications on file    Review of Systems  Constitutional: Negative for chills, activity change and  fatigue.  HENT: Negative for sore throat and trouble swallowing.   Eyes: Negative for visual disturbance.  Respiratory: Positive for shortness of breath and wheezing. Negative for cough and chest tightness.   Cardiovascular: Negative for chest pain, palpitations and leg swelling.  Gastrointestinal: Negative for nausea, vomiting, abdominal pain and blood in stool.  Genitourinary: Negative for urgency, frequency and difficulty urinating.  Musculoskeletal: Positive for back pain and arthralgias. Negative for gait problem.  Skin: Negative for rash.  Neurological: Negative for weakness and headaches.  Psychiatric/Behavioral: Negative for confusion and sleep disturbance. The patient is not nervous/anxious.     Filed Vitals:   11/02/15 0927  BP: 116/76  Pulse: 90  Temp: 98.1 F (36.7 C)  TempSrc: Oral  Resp: 20  Height: '5\' 7"'  (1.702 m)  Weight: 287 lb 9.6 oz (130.455 kg)  SpO2: 98%   Body mass index is 45.03 kg/(m^2).  Physical Exam  Constitutional: He is oriented to person, place, and time. He appears well-developed and well-nourished.  HENT:  Mouth/Throat: Oropharynx is clear and moist.  Eyes: Pupils are equal, round, and reactive to light. No scleral icterus.  Neck: Neck supple. Carotid bruit is not present. No thyromegaly present.  Cardiovascular: Normal rate, normal heart sounds and intact distal pulses.  An irregularly irregular rhythm present. Exam reveals no gallop and no friction rub.   No murmur heard. +2 pitting LE edema b/l. No calf TTP  Pulmonary/Chest: Effort normal and breath sounds normal. He has no wheezes. He has no rales. He exhibits no tenderness.  Abdominal: Soft. Bowel sounds are normal. He exhibits no distension, no abdominal bruit, no pulsatile midline mass and no mass. There is no tenderness. There is no rebound and no guarding.  Musculoskeletal: He exhibits edema and tenderness.  Lymphadenopathy:    He has no cervical adenopathy.  Neurological: He is alert  and oriented to person, place, and time.  Skin: Skin is warm and dry. No rash noted.  Psychiatric: He has a normal mood and affect. His behavior is normal. Judgment and thought content normal.     Labs reviewed: Abstract on 09/06/2015  Component Date Value Ref Range Status  . Hemoglobin 08/31/2015 13.5  13.5 - 17.5 g/dL Final  . HCT 08/31/2015 41  41 - 53 % Final  . Platelets 08/31/2015 235  150 - 399 K/L Final  . WBC 08/31/2015 7.7   Final  . Glucose 08/31/2015 173   Final  . BUN 08/31/2015 32* 4 - 21 mg/dL Final  . Creatinine 08/31/2015 1.1  0.6 - 1.3 mg/dL Final  . Potassium 08/31/2015 4.7  3.4 - 5.3 mmol/L Final  . Sodium 08/31/2015 140  137 - 147 mmol/L Final  . Alkaline Phosphatase 08/31/2015 82  25 - 125 U/L Final  . ALT 08/31/2015 27  10 - 40 U/L Final  . AST 08/31/2015 29  14 - 40 U/L Final  . Bilirubin, Total 08/31/2015 0.7   Final  Appointment on 08/31/2015  Component Date Value Ref Range Status  . Prostate Specific Ag, Serum 08/31/2015 1.0  0.0 - 4.0 ng/mL Final   Comment: Roche ECLIA methodology. According to the American Urological Association, Serum PSA should decrease and remain at undetectable levels after radical prostatectomy. The AUA defines biochemical recurrence as an initial PSA value 0.2 ng/mL or greater followed by a subsequent confirmatory PSA value 0.2 ng/mL or greater. Values obtained with different assay methods or kits cannot be used interchangeably. Results cannot be interpreted as absolute evidence of the presence or absence of malignant disease.   Admission on 08/24/2015, Discharged on 08/24/2015  Component Date Value Ref Range Status  . Prothrombin Time 08/24/2015 14.6  11.6 - 15.2 seconds Final  . INR 08/24/2015 1.12  0.00 - 1.49 Final  . Glucose-Capillary 08/24/2015 170* 65 - 99 mg/dL Final  . Comment 1 08/24/2015 Notify RN   Final  . Glucose-Capillary 08/24/2015 233* 65 - 99 mg/dL Final  . Comment 1 08/24/2015 Notify RN   Final  .  Comment 2 08/24/2015 Document in Chart   Final  Hospital Outpatient Visit on 08/15/2015  Component Date Value Ref Range Status  . WBC 08/15/2015 9.4  4.0 - 10.5 K/uL Final  . RBC 08/15/2015 4.87  4.22 - 5.81 MIL/uL Final  . Hemoglobin 08/15/2015 14.5  13.0 - 17.0 g/dL Final  . HCT 08/15/2015 44.3  39.0 - 52.0 % Final  . MCV 08/15/2015 91.0  78.0 - 100.0 fL Final  . MCH 08/15/2015 29.8  26.0 - 34.0 pg  Final  . MCHC 08/15/2015 32.7  30.0 - 36.0 g/dL Final  . RDW 08/15/2015 15.1  11.5 - 15.5 % Final  . Platelets 08/15/2015 324  150 - 400 K/uL Final  . Neutrophils Relative % 08/15/2015 84   Final  . Neutro Abs 08/15/2015 7.8* 1.7 - 7.7 K/uL Final  . Lymphocytes Relative 08/15/2015 12   Final  . Lymphs Abs 08/15/2015 1.1  0.7 - 4.0 K/uL Final  . Monocytes Relative 08/15/2015 3   Final  . Monocytes Absolute 08/15/2015 0.3  0.1 - 1.0 K/uL Final  . Eosinophils Relative 08/15/2015 1   Final  . Eosinophils Absolute 08/15/2015 0.1  0.0 - 0.7 K/uL Final  . Basophils Relative 08/15/2015 0   Final  . Basophils Absolute 08/15/2015 0.0  0.0 - 0.1 K/uL Final  . Sodium 08/15/2015 142  135 - 145 mmol/L Final  . Potassium 08/15/2015 4.1  3.5 - 5.1 mmol/L Final  . Chloride 08/15/2015 101  101 - 111 mmol/L Final  . CO2 08/15/2015 28  22 - 32 mmol/L Final  . Glucose, Bld 08/15/2015 153* 65 - 99 mg/dL Final  . BUN 08/15/2015 37* 6 - 20 mg/dL Final  . Creatinine, Ser 08/15/2015 1.30* 0.61 - 1.24 mg/dL Final  . Calcium 08/15/2015 9.3  8.9 - 10.3 mg/dL Final  . Total Protein 08/15/2015 8.0  6.5 - 8.1 g/dL Final  . Albumin 08/15/2015 3.7  3.5 - 5.0 g/dL Final  . AST 08/15/2015 36  15 - 41 U/L Final  . ALT 08/15/2015 29  17 - 63 U/L Final  . Alkaline Phosphatase 08/15/2015 61  38 - 126 U/L Final  . Total Bilirubin 08/15/2015 1.1  0.3 - 1.2 mg/dL Final  . GFR calc non Af Amer 08/15/2015 60* >60 mL/min Final  . GFR calc Af Amer 08/15/2015 >60  >60 mL/min Final   Comment: (NOTE) The eGFR has been calculated  using the CKD EPI equation. This calculation has not been validated in all clinical situations. eGFR's persistently <60 mL/min signify possible Chronic Kidney Disease.   . Anion gap 08/15/2015 13  5 - 15 Final  . Prothrombin Time 08/15/2015 29.7* 11.6 - 15.2 seconds Final  . INR 08/15/2015 2.88* 0.00 - 1.49 Final  Lab on 08/10/2015  Component Date Value Ref Range Status  . Sodium 08/10/2015 137  135 - 145 mEq/L Final  . Potassium 08/10/2015 3.9  3.5 - 5.1 mEq/L Final  . Chloride 08/10/2015 100  96 - 112 mEq/L Final  . CO2 08/10/2015 27  19 - 32 mEq/L Final  . Glucose, Bld 08/10/2015 161* 70 - 99 mg/dL Final  . BUN 08/10/2015 29* 6 - 23 mg/dL Final  . Creatinine, Ser 08/10/2015 1.32  0.40 - 1.50 mg/dL Final  . Calcium 08/10/2015 8.9  8.4 - 10.5 mg/dL Final  . GFR 08/10/2015 72.14  >60.00 mL/min Final    No results found.   Assessment/Plan   ICD-9-CM ICD-10-CM   1. Erectile dysfunction, unspecified erectile dysfunction type - multiple etiologies including medication induce vs psychogenic vs enlarged prostate 607.84 N52.9 Ambulatory referral to Urology  2. Chronic systolic heart failure (HCC) 428.22 I50.22 CMP     Lipid Panel  3. Atrial flutter, unspecified 427.32 I48.92   4. Controlled type 2 diabetes mellitus with complication, without long-term current use of insulin (HCC) 250.90 E11.8 Hemoglobin A1c  5. Idiopathic chronic gout without tophus, unspecified site 274.02 [id number]   6. Essential hypertension 401.9 I10 Lipid Panel  7. Arthritis 716.90  M19.90   8. Asthma, chronic, mild persistent, uncomplicated 116.57 X03.83     Declines prevnar today  Continue current medications as ordered  Follow up with cardiology as scheduled  Follow up in 3 mos for routine visit. Check fasting labs prior to appt  [facility] S. Perlie Gold  [facility] [address] [place], [place] [address] [phone] Cell (Monday-Friday 8 AM - 5  PM) [phone] After 5 PM and follow prompts

## 2015-11-02 NOTE — Patient Instructions (Signed)
Will call with urology referral  Continue current medications as ordered  Follow up with cardiology as scheduled  Follow up in 3 mos for routine visit. Check fasting labs prior to appt

## 2015-11-02 NOTE — Telephone Encounter (Signed)
Ordered Viagra Order #[id number] Will look for papers again Monday

## 2015-11-08 ENCOUNTER — Telehealth: Payer: Self-pay

## 2015-11-08 NOTE — Telephone Encounter (Signed)
Opened in error

## 2015-11-09 NOTE — Telephone Encounter (Signed)
Advised patient medication ready for pick up

## 2015-11-20 ENCOUNTER — Telehealth: Payer: Self-pay | Admitting: Cardiology

## 2015-11-20 NOTE — Telephone Encounter (Signed)
Faxed patient assistance papers for Viagra to [employer], confirmation received

## 2015-11-20 NOTE — Telephone Encounter (Signed)
°  New Prob   Pt has a question regarding some paperwork that was to be faxed to [facility]. Please call.

## 2015-11-27 DIAGNOSIS — M109 Gout, unspecified: Secondary | ICD-10-CM | POA: Diagnosis not present

## 2015-11-27 DIAGNOSIS — R799 Abnormal finding of blood chemistry, unspecified: Secondary | ICD-10-CM | POA: Diagnosis not present

## 2015-11-27 DIAGNOSIS — M25422 Effusion, left elbow: Secondary | ICD-10-CM | POA: Diagnosis not present

## 2015-11-27 DIAGNOSIS — M79642 Pain in left hand: Secondary | ICD-10-CM | POA: Diagnosis not present

## 2015-11-29 ENCOUNTER — Encounter: Payer: Self-pay | Admitting: Cardiology

## 2015-11-29 ENCOUNTER — Ambulatory Visit (INDEPENDENT_AMBULATORY_CARE_PROVIDER_SITE_OTHER): Payer: PPO | Admitting: Cardiology

## 2015-11-29 VITALS — BP 102/68 | HR 96 | Ht 67.0 in | Wt 292.8 lb

## 2015-11-29 DIAGNOSIS — I5022 Chronic systolic (congestive) heart failure: Secondary | ICD-10-CM

## 2015-11-29 DIAGNOSIS — I482 Chronic atrial fibrillation, unspecified: Secondary | ICD-10-CM

## 2015-11-29 NOTE — Patient Instructions (Signed)
Medication Instructions:  Your physician recommends that you continue on your current medications as directed. Please refer to the Current Medication list given to you today.  Labwork: none  Testing/Procedures: none  Follow-Up: Your physician wants you to follow-up in: 4 month ov with Dr Nahser  You will receive a reminder letter in the mail two months in advance. If you don't receive a letter, please call our office to schedule the follow-up appointment.  If you need a refill on your cardiac medications before your next appointment, please call your pharmacy.  

## 2015-11-29 NOTE — Progress Notes (Signed)
Cardiology Office Note   Date:  11/29/2015   ID:  VOSHON PETRO, DOB 12/14/1960, MRN [id number]  PCP:  Kirt Boys, DO  Cardiologist: Cassell Clement MD  Chief Complaint  Patient presents with  . Shortness of Breath  . Benign hypertensive heart disease without heart failure  . Edema    legs and feet      History of Present Illness: Jeremiah Perkins is a 55 y.o. male who presents for scheduled follow-up visit He had a past history of paroxysmal atrial fibrillation. The patient underwent elective outpatient cardioversion on 10/26/13.  Marland Kitchen He has a history of a nonischemic cardiomyopathy with previous catheterization showing no significant obstructive disease. His ejection fraction is estimated at 20-25%. He is diuresing on his current outpatient regimen. He still notes significant dyspnea with minimal exertion. Marland Kitchen He does have occasional chest discomfort. The patient also has a history of diabetes mellitus type 2 which is followed at the internal medicine clinic. He has a history of obstructive sleep apnea and morbid obesity and diabetic peripheral neuropathy. He has a past history of hypertension and gout. He has a past history of peripheral neuropathy and is on gabapentin The patient has finished the the cardiac rehabilitation program. He elected not to go into the maintenance phase because it would have been expensive for him. The patient reiterated today that he is not interested in an ICD or a pacemaker. He has been getting some exercise. In the summer he plays golf.  In the winter he is sedentary.  His weight has been going up because of inactivity.  He is also had some mild peripheral edema.e. He does have exertional dyspnea and has to stop and rest periodically. Since we last saw him he had successful cholecystectomy by Dr. Abbey Chatters. The patient has erectile dysfunction.  He states that Viagra has not been to effective for him.  He has an appointment pending  with urology. Kirt Boys at [facility] is his PCP  Past Medical History  Diagnosis Date  . Hypertension   . Congestive heart failure (HCC)     No echo data available. Myocardial perfusion 2005 with EF 61%, presumed diastolic HF   . Diverticulosis     with history of diverticulitis in 10/2011  . Major depression (HCC)   . Anxiety   . Psychosexual dysfunction with inhibited sexual excitement   . Hyperlipidemia   . Atrial flutter (HCC)   . Morbid obesity (HCC)   . Chronic bronchitis (HCC)     "get it q yr" (01/02/2015)  . Shortness of breath     "all the time" (07/26/2013)  . Chronic back pain   . GERD (gastroesophageal reflux disease)   . Asthma   . Pneumonia 2013; 2014  . OSA (obstructive sleep apnea) 1998    "wear my CPAP a couple times/month" (01/02/2015)  . Diabetic peripheral neuropathy (HCC)   . Type II diabetes mellitus (HCC) dx'd 1991    previously on insulin in 2000 for 3-4 years (but was stopped because adamantly did not want to be on insulin)  . Arthritis     involving knees, ankles, back, elbows (01/02/2015)  . Gout   . Myocardial infarction [facility])     "one doctor said I did back in 2014"  . Cholelithiasis     Past Surgical History  Procedure Laterality Date  . Ankle reconstruction Right 1990's    2/2 trauma sustained after fall  . Amputation Left 1976  third digit  . Cyst removal leg Left 1960's    back of  leg  . Tee without cardioversion N/A 07/28/2013    Procedure: TRANSESOPHAGEAL ECHOCARDIOGRAM (TEE);  Surgeon: Laurey Morale, MD;  Location: [facility] ENDOSCOPY;  Service: Cardiovascular;  Laterality: N/A;  . Cardioversion N/A 07/28/2013    Procedure: CARDIOVERSION;  Surgeon: Laurey Morale, MD;  Location: [facility] ENDOSCOPY;  Service: Cardiovascular;  Laterality: N/A;  . Cardioversion N/A 10/26/2013    Procedure: CARDIOVERSION;  Surgeon: Cassell Clement, MD;  Location: [facility] ENDOSCOPY;  Service: Cardiovascular;  Laterality: N/A;  . Left heart catheterization  with coronary angiogram N/A 07/27/2013    Procedure: LEFT HEART CATHETERIZATION WITH CORONARY ANGIOGRAM;  Surgeon: Peter M Swaziland, MD;  Location: [facility] CATH LAB;  Service: Cardiovascular;  Laterality: N/A;  . Cardiac catheterization  1990's; 07/2013  . Finger amputation    . Cholecystectomy N/A 08/24/2015    Procedure: LAPAROSCOPIC CHOLECYSTECTOMY WITH INTRAOPERATIVE CHOLANGIOGRAM;  Surgeon: Avel Peace, MD;  Location: [facility] ORS;  Service: General;  Laterality: N/A;     Current Outpatient Prescriptions  Medication Sig Dispense Refill  . calcium carbonate (TUMS - DOSED IN MG ELEMENTAL CALCIUM) 500 MG chewable tablet Chew 1 tablet by mouth as needed for indigestion or heartburn.    . carvedilol (COREG) 6.25 MG tablet Take 1 tablet (6.25 mg total) by mouth 2 (two) times daily. 60 tablet 5  . colchicine 0.6 MG tablet Take one tablet by mouth once daily for gout prevention 30 tablet 3  . diclofenac sodium (VOLTAREN) 1 % GEL Apply 2 g topically 4 (four) times daily.    Marland Kitchen gabapentin (NEURONTIN) 600 MG tablet Take by mouth. Take [phone] mg by mouth twice daily    . levalbuterol (XOPENEX HFA) 45 MCG/ACT inhaler Inhale 1-2 puffs into the lungs every 4 (four) hours as needed for wheezing.    Marland Kitchen losartan (COZAAR) 25 MG tablet Take 12.5 mg by mouth daily.    . metFORMIN (GLUCOPHAGE) 1000 MG tablet Take 1 tablet (1,000 mg total) by mouth daily with breakfast. 90 tablet 1  . ondansetron (ZOFRAN ODT) 8 MG disintegrating tablet Take 1 tablet (8 mg total) by mouth every 8 (eight) hours as needed for nausea or vomiting. 12 tablet 0  . ondansetron (ZOFRAN) 4 MG tablet Take 1 tablet (4 mg total) by mouth every 4 (four) hours as needed for nausea. 30 tablet 0  . oxyCODONE (OXY IR/ROXICODONE) 5 MG immediate release tablet Take 1-2 tablets (5-10 mg total) by mouth every 4 (four) hours as needed for moderate pain, severe pain or breakthrough pain. 60 tablet 0  . rivaroxaban (XARELTO) 20 MG TABS tablet Take 20 mg by mouth  daily with supper.    . sildenafil (VIAGRA) 100 MG tablet Take 100 mg by mouth daily as needed for erectile dysfunction.    . torsemide (DEMADEX) 20 MG tablet Take 20 mg by mouth as directed. Take 2 daily     No current facility-administered medications for this visit.    Allergies:   Eggs or egg-derived products and Sulfa antibiotics    Social History:  The patient  reports that he has quit smoking. His smoking use included Cigarettes. He has a 1.2 pack-year smoking history. He has never used smokeless tobacco. He reports that he drinks alcohol. He reports that he does not use illicit drugs.   Family History:  The patient's family history includes Alzheimer's disease in his maternal grandmother and mother; Breast cancer in his paternal aunt; CAD (age  of onset: 40) in his brother; CAD (age of onset: 18) in his mother; Diabetes in his maternal aunt, maternal grandmother, and mother; Gout in his father; Hypertension in his brother and mother; Lung cancer in his father; Seizures in his brother and paternal uncle; Stomach cancer in his father.    ROS:  Please see the history of present illness.   Otherwise, review of systems are positive for none.   All other systems are reviewed and negative.    PHYSICAL EXAM: VS:  BP 102/68 mmHg  Pulse 96  Ht 5\' 7"  (1.702 m)  Wt 292 lb 12.8 oz (132.813 kg)  BMI 45.85 kg/m2 , BMI Body mass index is 45.85 kg/(m^2). GEN: Well nourished, well developed, in no acute distress HEENT: normal Neck: no JVD, carotid bruits, or masses Cardiac: Atrial fibrillation with controlled ventricular response.  no murmurs, rubs, or gallops,no edema  Respiratory:  clear to auscultation bilaterally, normal work of breathing GI: soft, nontender, nondistended, + BS MS: no deformity or atrophy Skin: warm and dry, no rash Neuro:  Strength and sensation are intact Psych: euthymic mood, full affect   EKG:  EKG is ordered today. The ekg ordered today demonstrates atrial  fibrillation with heart rate 96 bpm.  Low voltage QRS.  Cannot rule out inferior wall MI.  Since prior tracing of 07/31/15, no significant change   Recent Labs: 01/03/2015: TSH 5.852* 07/23/2015: B Natriuretic Peptide 239.8* 08/31/2015: ALT 27; BUN 32*; Creatinine 1.1; Hemoglobin 13.5; Platelets 235; Potassium 4.7; Sodium 140    Lipid Panel    Component Value Date/Time   CHOL 160 05/30/2015 1208   CHOL 186 01/04/2015 0518   TRIG 158* 05/30/2015 1208   HDL 40 05/30/2015 1208   HDL 33* 01/04/2015 0518   CHOLHDL 4.0 05/30/2015 1208   CHOLHDL 5.6 01/04/2015 0518   VLDL 40 01/04/2015 0518   LDLCALC 88 05/30/2015 1208   LDLCALC 113* 01/04/2015 0518      Wt Readings from Last 3 Encounters:  11/29/15 292 lb 12.8 oz (132.813 kg)  11/02/15 287 lb 9.6 oz (130.455 kg)  08/24/15 277 lb (125.646 kg)         ASSESSMENT AND PLAN:  1. Paroxysmal atrial flutter fibrillation,, now back in atrial flutter since previous EKG in March 2016 showing normal sinus rhythm.  He has been unable to hold normal sinus rhythm.  Therefore previous amiodarone was stopped at a previous visit.  Strategy is rate control and permanent anticoagulation. 2. Chronic systolic heart failure 3. Diabetic peripheral neuropathy 4. Morbid obesity 5.  Erectile dysfunction.  He states that he has an appointment with urology pending     Current medicines are reviewed at length with the patient today.  The patient does not have concerns regarding medicines.  The following changes have been made:  no change  Labs/ tests ordered today include:   Orders Placed This Encounter  Procedures  . EKG 12-Lead    Disposition: Continue current medication including long-term Xarelto 20 mg daily.  Recheck in 4 months for office visit with Dr. Elease Hashimoto   Signed, Cassell Clement MD 11/29/2015 5:54 PM    [facility] [address] [place], [place], [place]  [address] Phone: [phone]; Fax: [phone]

## 2015-12-13 ENCOUNTER — Emergency Department (HOSPITAL_COMMUNITY)
Admission: EM | Admit: 2015-12-13 | Discharge: 2015-12-13 | Discharge status: Against Medical Advice | Payer: PPO | Source: Home / Self Care

## 2015-12-13 ENCOUNTER — Emergency Department (HOSPITAL_COMMUNITY): Payer: PPO

## 2015-12-13 ENCOUNTER — Encounter: Payer: Self-pay | Admitting: Nurse Practitioner

## 2015-12-13 ENCOUNTER — Telehealth: Payer: Self-pay | Admitting: Internal Medicine

## 2015-12-13 ENCOUNTER — Encounter (HOSPITAL_COMMUNITY): Payer: Self-pay | Admitting: Radiology

## 2015-12-13 ENCOUNTER — Ambulatory Visit (INDEPENDENT_AMBULATORY_CARE_PROVIDER_SITE_OTHER): Payer: PPO | Admitting: Nurse Practitioner

## 2015-12-13 ENCOUNTER — Encounter (HOSPITAL_COMMUNITY): Payer: Self-pay | Admitting: Family Medicine

## 2015-12-13 ENCOUNTER — Inpatient Hospital Stay (HOSPITAL_COMMUNITY)
Admission: EM | Admit: 2015-12-13 | Discharge: 2015-12-21 | DRG: 291 | Disposition: A | Discharge status: Home / Self Care | Payer: PPO | Source: Ambulatory Visit | Attending: Internal Medicine | Admitting: Internal Medicine

## 2015-12-13 VITALS — BP 120/80 | HR 105 | Temp 97.4°F | Resp 24 | Ht 67.0 in | Wt 285.8 lb

## 2015-12-13 DIAGNOSIS — M109 Gout, unspecified: Secondary | ICD-10-CM | POA: Diagnosis not present

## 2015-12-13 DIAGNOSIS — I255 Ischemic cardiomyopathy: Secondary | ICD-10-CM | POA: Diagnosis present

## 2015-12-13 DIAGNOSIS — I5043 Acute on chronic combined systolic (congestive) and diastolic (congestive) heart failure: Secondary | ICD-10-CM | POA: Diagnosis not present

## 2015-12-13 DIAGNOSIS — I130 Hypertensive heart and chronic kidney disease with heart failure and stage 1 through stage 4 chronic kidney disease, or unspecified chronic kidney disease: Secondary | ICD-10-CM | POA: Diagnosis not present

## 2015-12-13 DIAGNOSIS — R7989 Other specified abnormal findings of blood chemistry: Secondary | ICD-10-CM | POA: Diagnosis not present

## 2015-12-13 DIAGNOSIS — R197 Diarrhea, unspecified: Secondary | ICD-10-CM

## 2015-12-13 DIAGNOSIS — G8929 Other chronic pain: Secondary | ICD-10-CM

## 2015-12-13 DIAGNOSIS — Z9114 Patient's other noncompliance with medication regimen: Secondary | ICD-10-CM

## 2015-12-13 DIAGNOSIS — R112 Nausea with vomiting, unspecified: Secondary | ICD-10-CM | POA: Insufficient documentation

## 2015-12-13 DIAGNOSIS — I252 Old myocardial infarction: Secondary | ICD-10-CM

## 2015-12-13 DIAGNOSIS — J4530 Mild persistent asthma, uncomplicated: Secondary | ICD-10-CM

## 2015-12-13 DIAGNOSIS — I509 Heart failure, unspecified: Secondary | ICD-10-CM | POA: Insufficient documentation

## 2015-12-13 DIAGNOSIS — Z882 Allergy status to sulfonamides status: Secondary | ICD-10-CM | POA: Diagnosis not present

## 2015-12-13 DIAGNOSIS — K219 Gastro-esophageal reflux disease without esophagitis: Secondary | ICD-10-CM | POA: Diagnosis not present

## 2015-12-13 DIAGNOSIS — M1990 Unspecified osteoarthritis, unspecified site: Secondary | ICD-10-CM

## 2015-12-13 DIAGNOSIS — Z7901 Long term (current) use of anticoagulants: Secondary | ICD-10-CM | POA: Diagnosis not present

## 2015-12-13 DIAGNOSIS — N183 Chronic kidney disease, stage 3 unspecified: Secondary | ICD-10-CM

## 2015-12-13 DIAGNOSIS — R1013 Epigastric pain: Secondary | ICD-10-CM

## 2015-12-13 DIAGNOSIS — I4892 Unspecified atrial flutter: Secondary | ICD-10-CM | POA: Diagnosis not present

## 2015-12-13 DIAGNOSIS — Z6841 Body Mass Index (BMI) 40.0 and over, adult: Secondary | ICD-10-CM | POA: Diagnosis not present

## 2015-12-13 DIAGNOSIS — F528 Other sexual dysfunction not due to a substance or known physiological condition: Secondary | ICD-10-CM

## 2015-12-13 DIAGNOSIS — R748 Abnormal levels of other serum enzymes: Secondary | ICD-10-CM | POA: Diagnosis present

## 2015-12-13 DIAGNOSIS — E662 Morbid (severe) obesity with alveolar hypoventilation: Secondary | ICD-10-CM | POA: Diagnosis present

## 2015-12-13 DIAGNOSIS — E1122 Type 2 diabetes mellitus with diabetic chronic kidney disease: Secondary | ICD-10-CM | POA: Diagnosis present

## 2015-12-13 DIAGNOSIS — Z89022 Acquired absence of left finger(s): Secondary | ICD-10-CM | POA: Diagnosis not present

## 2015-12-13 DIAGNOSIS — J9601 Acute respiratory failure with hypoxia: Secondary | ICD-10-CM | POA: Diagnosis present

## 2015-12-13 DIAGNOSIS — R609 Edema, unspecified: Secondary | ICD-10-CM | POA: Diagnosis not present

## 2015-12-13 DIAGNOSIS — I5022 Chronic systolic (congestive) heart failure: Secondary | ICD-10-CM

## 2015-12-13 DIAGNOSIS — J449 Chronic obstructive pulmonary disease, unspecified: Secondary | ICD-10-CM | POA: Insufficient documentation

## 2015-12-13 DIAGNOSIS — M1000 Idiopathic gout, unspecified site: Secondary | ICD-10-CM | POA: Diagnosis present

## 2015-12-13 DIAGNOSIS — I480 Paroxysmal atrial fibrillation: Secondary | ICD-10-CM | POA: Diagnosis not present

## 2015-12-13 DIAGNOSIS — G4733 Obstructive sleep apnea (adult) (pediatric): Secondary | ICD-10-CM | POA: Diagnosis not present

## 2015-12-13 DIAGNOSIS — N182 Chronic kidney disease, stage 2 (mild): Secondary | ICD-10-CM | POA: Diagnosis present

## 2015-12-13 DIAGNOSIS — R0602 Shortness of breath: Secondary | ICD-10-CM

## 2015-12-13 DIAGNOSIS — J45901 Unspecified asthma with (acute) exacerbation: Secondary | ICD-10-CM | POA: Diagnosis present

## 2015-12-13 DIAGNOSIS — M1A00X0 Idiopathic chronic gout, unspecified site, without tophus (tophi): Secondary | ICD-10-CM

## 2015-12-13 DIAGNOSIS — J189 Pneumonia, unspecified organism: Secondary | ICD-10-CM | POA: Diagnosis present

## 2015-12-13 DIAGNOSIS — E118 Type 2 diabetes mellitus with unspecified complications: Secondary | ICD-10-CM

## 2015-12-13 DIAGNOSIS — R0600 Dyspnea, unspecified: Secondary | ICD-10-CM | POA: Diagnosis not present

## 2015-12-13 DIAGNOSIS — R778 Other specified abnormalities of plasma proteins: Secondary | ICD-10-CM | POA: Diagnosis present

## 2015-12-13 DIAGNOSIS — I248 Other forms of acute ischemic heart disease: Secondary | ICD-10-CM | POA: Diagnosis not present

## 2015-12-13 DIAGNOSIS — E1142 Type 2 diabetes mellitus with diabetic polyneuropathy: Secondary | ICD-10-CM | POA: Diagnosis not present

## 2015-12-13 DIAGNOSIS — I9589 Other hypotension: Secondary | ICD-10-CM

## 2015-12-13 DIAGNOSIS — E119 Type 2 diabetes mellitus without complications: Secondary | ICD-10-CM

## 2015-12-13 DIAGNOSIS — J441 Chronic obstructive pulmonary disease with (acute) exacerbation: Secondary | ICD-10-CM | POA: Diagnosis present

## 2015-12-13 DIAGNOSIS — Z7984 Long term (current) use of oral hypoglycemic drugs: Secondary | ICD-10-CM | POA: Diagnosis not present

## 2015-12-13 DIAGNOSIS — N529 Male erectile dysfunction, unspecified: Secondary | ICD-10-CM | POA: Diagnosis present

## 2015-12-13 DIAGNOSIS — I482 Chronic atrial fibrillation: Secondary | ICD-10-CM | POA: Diagnosis present

## 2015-12-13 DIAGNOSIS — E039 Hypothyroidism, unspecified: Secondary | ICD-10-CM

## 2015-12-13 DIAGNOSIS — R05 Cough: Secondary | ICD-10-CM | POA: Insufficient documentation

## 2015-12-13 DIAGNOSIS — I481 Persistent atrial fibrillation: Secondary | ICD-10-CM | POA: Diagnosis not present

## 2015-12-13 DIAGNOSIS — I10 Essential (primary) hypertension: Secondary | ICD-10-CM | POA: Insufficient documentation

## 2015-12-13 DIAGNOSIS — I5023 Acute on chronic systolic (congestive) heart failure: Secondary | ICD-10-CM | POA: Diagnosis not present

## 2015-12-13 DIAGNOSIS — R1011 Right upper quadrant pain: Secondary | ICD-10-CM

## 2015-12-13 DIAGNOSIS — E876 Hypokalemia: Secondary | ICD-10-CM | POA: Diagnosis not present

## 2015-12-13 DIAGNOSIS — K8020 Calculus of gallbladder without cholecystitis without obstruction: Secondary | ICD-10-CM

## 2015-12-13 DIAGNOSIS — E038 Other specified hypothyroidism: Secondary | ICD-10-CM | POA: Diagnosis present

## 2015-12-13 DIAGNOSIS — Z87891 Personal history of nicotine dependence: Secondary | ICD-10-CM

## 2015-12-13 DIAGNOSIS — J440 Chronic obstructive pulmonary disease with acute lower respiratory infection: Secondary | ICD-10-CM | POA: Diagnosis present

## 2015-12-13 DIAGNOSIS — R059 Cough, unspecified: Secondary | ICD-10-CM

## 2015-12-13 DIAGNOSIS — R069 Unspecified abnormalities of breathing: Secondary | ICD-10-CM | POA: Diagnosis not present

## 2015-12-13 DIAGNOSIS — Z0000 Encounter for general adult medical examination without abnormal findings: Secondary | ICD-10-CM

## 2015-12-13 HISTORY — DX: Chronic obstructive pulmonary disease, unspecified: J44.9

## 2015-12-13 LAB — COMPREHENSIVE METABOLIC PANEL
ALBUMIN: 2.9 g/dL — AB (ref 3.5–5.0)
ALK PHOS: 58 U/L (ref 38–126)
ALT: 20 U/L (ref 17–63)
ALT: 21 U/L (ref 17–63)
ANION GAP: 11 (ref 5–15)
AST: 49 U/L — AB (ref 15–41)
AST: 47 U/L — AB (ref 15–41)
Albumin: 2.9 g/dL — ABNORMAL LOW (ref 3.5–5.0)
Alkaline Phosphatase: 60 U/L (ref 38–126)
Anion gap: 11 (ref 5–15)
BUN: 13 mg/dL (ref 6–20)
BUN: 14 mg/dL (ref 6–20)
CALCIUM: 8.7 mg/dL — AB (ref 8.9–10.3)
CHLORIDE: 101 mmol/L (ref 101–111)
CHLORIDE: 104 mmol/L (ref 101–111)
CO2: 24 mmol/L (ref 22–32)
CO2: 22 mmol/L (ref 22–32)
CREATININE: 0.94 mg/dL (ref 0.61–1.24)
Calcium: 8.7 mg/dL — ABNORMAL LOW (ref 8.9–10.3)
Creatinine, Ser: 0.95 mg/dL (ref 0.61–1.24)
GFR calc Af Amer: >60 mL/min (ref >60)
GFR calc non Af Amer: >60 mL/min (ref >60)
GFR calc non Af Amer: >60 mL/min (ref >60)
GLUCOSE: 107 mg/dL — AB (ref 65–99)
Glucose, Bld: 106 mg/dL — ABNORMAL HIGH (ref 65–99)
POTASSIUM: 3.5 mmol/L (ref 3.5–5.1)
Potassium: 3.6 mmol/L (ref 3.5–5.1)
SODIUM: 136 mmol/L (ref 135–145)
SODIUM: 137 mmol/L (ref 135–145)
Total Bilirubin: 1.2 mg/dL (ref 0.3–1.2)
Total Bilirubin: 1.2 mg/dL (ref 0.3–1.2)
Total Protein: 6.6 g/dL (ref 6.5–8.1)
Total Protein: 6.4 g/dL — ABNORMAL LOW (ref 6.5–8.1)

## 2015-12-13 LAB — CBC
HEMATOCRIT: 39.2 % (ref 39.0–52.0)
HEMOGLOBIN: 12.4 g/dL — AB (ref 13.0–17.0)
MCH: 29.2 pg (ref 26.0–34.0)
MCHC: 31.6 g/dL (ref 30.0–36.0)
MCV: 92.2 fL (ref 78.0–100.0)
Platelets: 217 10*3/uL (ref 150–400)
RBC: 4.25 MIL/uL (ref 4.22–5.81)
RDW: 15.1 % (ref 11.5–15.5)
WBC: 4.3 10*3/uL (ref 4.0–10.5)

## 2015-12-13 LAB — CBC WITH DIFFERENTIAL/PLATELET
BASOS PCT: 0 %
Basophils Absolute: 0 10*3/uL (ref 0.0–0.1)
EOS ABS: 0 10*3/uL (ref 0.0–0.7)
EOS PCT: 0 %
HCT: 39.9 % (ref 39.0–52.0)
HEMOGLOBIN: 12.7 g/dL — AB (ref 13.0–17.0)
LYMPHS ABS: 0.9 10*3/uL (ref 0.7–4.0)
Lymphocytes Relative: 22 %
MCH: 29.1 pg (ref 26.0–34.0)
MCHC: 31.8 g/dL (ref 30.0–36.0)
MCV: 91.3 fL (ref 78.0–100.0)
MONO ABS: 0.2 10*3/uL (ref 0.1–1.0)
MONOS PCT: 6 %
NEUTROS PCT: 72 %
Neutro Abs: 2.9 10*3/uL (ref 1.7–7.7)
PLATELETS: 201 10*3/uL (ref 150–400)
RBC: 4.37 MIL/uL (ref 4.22–5.81)
RDW: 14.9 % (ref 11.5–15.5)
WBC: 4.1 10*3/uL (ref 4.0–10.5)

## 2015-12-13 LAB — BRAIN NATRIURETIC PEPTIDE: B Natriuretic Peptide: 197.5 pg/mL — ABNORMAL HIGH (ref 0.0–100.0)

## 2015-12-13 LAB — TROPONIN I: Troponin I: 0.15 ng/mL — ABNORMAL HIGH (ref <0.031)

## 2015-12-13 MED ORDER — LOPERAMIDE HCL 2 MG PO CAPS
4.0000 mg | ORAL_CAPSULE | Freq: Once | ORAL | Status: AC
  Administered 2015-12-13: 4 mg via ORAL
  Filled 2015-12-13: qty 2

## 2015-12-13 MED ORDER — ONDANSETRON HCL 4 MG/2ML IJ SOLN
4.0000 mg | Freq: Once | INTRAMUSCULAR | Status: AC
  Administered 2015-12-13: 4 mg via INTRAVENOUS
  Filled 2015-12-13: qty 2

## 2015-12-13 MED ORDER — SODIUM CHLORIDE 0.9 % IV SOLN
Freq: Once | INTRAVENOUS | Status: AC
  Administered 2015-12-13: 18:00:00 via INTRAVENOUS

## 2015-12-13 MED ORDER — SODIUM CHLORIDE 0.9 % IV BOLUS (SEPSIS): 500.0000 mL | Freq: Once | INTRAVENOUS | Status: DC

## 2015-12-13 MED ORDER — IOHEXOL 350 MG/ML SOLN
100.0000 mL | Freq: Once | INTRAVENOUS | Status: AC | PRN
  Administered 2015-12-13: 100 mL via INTRAVENOUS

## 2015-12-13 MED ORDER — IPRATROPIUM-ALBUTEROL 0.5-2.5 (3) MG/3ML IN SOLN
3.0000 mL | Freq: Once | RESPIRATORY_TRACT | Status: AC
  Administered 2015-12-13: 3 mL via RESPIRATORY_TRACT
  Filled 2015-12-13: qty 3

## 2015-12-13 MED ORDER — IPRATROPIUM-ALBUTEROL 0.5-2.5 (3) MG/3ML IN SOLN
3.0000 mL | Freq: Once | RESPIRATORY_TRACT | Status: AC
Start: 2015-12-13 — End: 2015-12-13
  Administered 2015-12-13: 3 mL via RESPIRATORY_TRACT
  Filled 2015-12-13: qty 3

## 2015-12-13 NOTE — ED Notes (Signed)
Pt here for cold, cough and SOB. sts some swelling in extremities. sts weak and unable to sleep

## 2015-12-13 NOTE — ED Provider Notes (Signed)
CSN: [id number]     Arrival date & time 12/13/15  1409 History   First MD Initiated Contact with Patient 12/13/15 1607     Chief Complaint  Patient presents with  . Shortness of Breath     (Consider location/radiation/quality/duration/timing/severity/associated sxs/prior Treatment) Patient is a 55 y.o. Perkins presenting with shortness of breath. The history is provided by the patient.  Shortness of Breath He has been sick for the past 4 days with cough productive of caramel colored sputum. Is also associated nausea and vomiting and diarrhea. This has been associated with dyspnea. He denies fever or sweats but has had some chills. He denies arthralgias or myalgias. There was some contact a family function at about the time he started getting sick. He has been using his home inhaler which does give slight, temporary relief. He states he has lost his appetite. He saw his PCP who referred him here for further evaluation.  Past Medical History  Diagnosis Date  . Hypertension   . Congestive heart failure (HCC)     No echo data available. Myocardial perfusion 2005 with EF 61%, presumed diastolic HF   . Diverticulosis     with history of diverticulitis in 10/2011  . Major depression (HCC)   . Anxiety   . Psychosexual dysfunction with inhibited sexual excitement   . Hyperlipidemia   . Atrial flutter (HCC)   . Morbid obesity (HCC)   . Chronic bronchitis (HCC)     "get it q yr" (01/02/2015)  . Shortness of breath     "all the time" (07/26/2013)  . Chronic back pain   . GERD (gastroesophageal reflux disease)   . Asthma   . Pneumonia 2013; 2014  . OSA (obstructive sleep apnea) 1998    "wear my CPAP a couple times/month" (01/02/2015)  . Diabetic peripheral neuropathy (HCC)   . Type II diabetes mellitus (HCC) dx'd 1991    previously on insulin in 2000 for 3-4 years (but was stopped because adamantly did not want to be on insulin)  . Arthritis     involving knees, ankles, back, elbows (01/02/2015)  .  Gout   . Myocardial infarction [facility])     "one doctor said I did back in 2014"  . Cholelithiasis    Past Surgical History  Procedure Laterality Date  . Ankle reconstruction Right 1990's    2/2 trauma sustained after fall  . 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 Cyst removal leg Left 1960's    back of  leg  . Tee without cardioversion N/A 07/28/2013    Procedure: TRANSESOPHAGEAL ECHOCARDIOGRAM (TEE);  Surgeon: Laurey Morale, MD;  Location: [facility] ENDOSCOPY;  Service: Cardiovascular;  Laterality: N/A;  . Cardioversion N/A 07/28/2013    Procedure: CARDIOVERSION;  Surgeon: Laurey Morale, MD;  Location: [facility] ENDOSCOPY;  Service: Cardiovascular;  Laterality: N/A;  . Cardioversion N/A 10/26/2013    Procedure: CARDIOVERSION;  Surgeon: Cassell Clement, MD;  Location: [facility] ENDOSCOPY;  Service: Cardiovascular;  Laterality: N/A;  . Left heart catheterization with coronary angiogram N/A 07/27/2013    Procedure: LEFT HEART CATHETERIZATION WITH CORONARY ANGIOGRAM;  Surgeon: Peter M Swaziland, MD;  Location: [facility] CATH LAB;  Service: Cardiovascular;  Laterality: N/A;  . Cardiac catheterization  1990's; 07/2013  . Finger amputation    . Cholecystectomy N/A 08/24/2015    Procedure: LAPAROSCOPIC CHOLECYSTECTOMY WITH INTRAOPERATIVE CHOLANGIOGRAM;  Surgeon: Avel Peace, MD;  Location: [facility] ORS;  Service: General;  Laterality: N/A;   Family History  Problem Relation Age of Onset  . Diabetes Mother   . CAD Mother 14    requiring quadruple bypass  . Hypertension Mother   . Alzheimer's disease Mother   . Stomach cancer Father   . Lung cancer Father     was a smoker  . Gout Father   . CAD Brother 48    requiring quadruple bypass  . Seizures Brother   . Hypertension Brother   . Diabetes Maternal Grandmother   . Alzheimer's disease Maternal Grandmother   . Breast cancer Paternal Aunt   . Diabetes Maternal Aunt   . Seizures Paternal Uncle    Social History  Substance Use Topics  . Smoking status: Former Smoker  -- 0.12 packs/day for 10 years    Types: Cigarettes  . Smokeless tobacco: Never Used     Comment: rarely.  . Alcohol Use: 0.0 oz/week    0 Standard drinks or equivalent per week     Comment: OCCASIONAL    Review of Systems  Respiratory: Positive for shortness of breath.   All other systems reviewed and are negative.     Allergies  Eggs or egg-derived products and Sulfa antibiotics  Home Medications   Prior to Admission medications   Medication Sig Start Date End Date Taking? Authorizing Provider  calcium carbonate (TUMS - DOSED IN MG ELEMENTAL CALCIUM) 500 MG chewable tablet Chew 1 tablet by mouth as needed for indigestion or heartburn. Reported on 12/13/2015    Historical Provider, MD  carvedilol (COREG) 6.25 MG tablet Take 1 tablet (6.25 mg total) by mouth 2 (two) times daily. Patient not taking: Reported on 12/13/2015 06/19/15   Cassell Clement, MD  colchicine 0.6 MG tablet Take one tablet by mouth once daily for gout prevention Patient not taking: Reported on 12/13/2015 07/03/15   Kirt Boys, DO  diclofenac sodium (VOLTAREN) 1 % GEL Apply 2 g topically 4 (four) times daily. Reported on 12/13/2015    Historical Provider, MD  gabapentin (NEURONTIN) 600 MG tablet Take by mouth. Reported on 12/13/2015    Historical Provider, MD  levalbuterol [facility] HFA) 45 MCG/ACT inhaler Inhale 1-2 puffs into the lungs every 4 (four) hours as needed for wheezing. Reported on 12/13/2015    Historical Provider, MD  losartan (COZAAR) 25 MG tablet Take 12.5 mg by mouth daily. Reported on 12/13/2015    Historical Provider, MD  metFORMIN (GLUCOPHAGE) 1000 MG tablet Take 1 tablet (1,000 mg total) by mouth daily with breakfast. Patient not taking: Reported on 12/13/2015 10/09/15   Kirt Boys, DO  ondansetron (ZOFRAN ODT) 8 MG disintegrating tablet Take 1 tablet (8 mg total) by mouth every 8 (eight) hours as needed for nausea or vomiting. Patient not taking: Reported on 12/13/2015 02/06/15   Azalia Bilis, MD   ondansetron (ZOFRAN) 4 MG tablet Take 1 tablet (4 mg total) by mouth every 4 (four) hours as needed for nausea. Patient not taking: Reported on 12/13/2015 08/24/15   Avel Peace, MD  oxyCODONE (OXY IR/ROXICODONE) 5 MG immediate release tablet Take 1-2 tablets (5-10 mg total) by mouth every 4 (four) hours as needed for moderate pain, severe pain or breakthrough pain. Patient not taking: Reported on 12/13/2015 11/02/15   Kirt Boys, DO  rivaroxaban (XARELTO) 20 MG TABS tablet Take 20 mg by mouth daily with supper. Reported on 12/13/2015    Historical Provider, MD  sildenafil (VIAGRA) 100 MG tablet Take 100 mg by mouth daily as needed for erectile dysfunction. Reported on 12/13/2015    Historical  Provider, MD  torsemide (DEMADEX) 20 MG tablet Take 20 mg by mouth as directed. Reported on 12/13/2015    Historical Provider, MD   BP 101/71 mmHg  Pulse 59  Temp(Src) 98.9 F (37.2 C)  Resp 24  SpO2 95% Physical Exam  Nursing note and vitals reviewed.  55 year old Perkins, who is mildly dyspneic at rest with tachypnea but not using accessory muscles of respiration. Vital signs are significant for tachypnea. Oxygen saturation is 95%, which is normal. Head is normocephalic and atraumatic. PERRLA, EOMI. Oropharynx is clear. Neck is nontender and supple without adenopathy or JVD. Back is nontender and there is no CVA tenderness. Lungs have coarse wheezes throughout but no rales or rhonchi. Chest is nontender. Heart has regular rate and rhythm without murmur. Abdomen is soft, flat, nontender without masses or hepatosplenomegaly and peristalsis is normoactive. Extremities have 2+ edema, full range of motion is present. Skin is warm and dry without rash. Neurologic: Mental status is normal, cranial nerves are intact, there are no motor or sensory deficits.  ED Course  Procedures (including critical care time) Labs Review Results for orders placed or performed during the hospital encounter of 12/13/15   Brain natriuretic peptide  Result Value Ref Range   B Natriuretic Peptide 197.5 (H) 0.0 - 100.0 pg/mL  Troponin I  Result Value Ref Range   Troponin I 0.15 (H) <0.031 ng/mL  Comprehensive metabolic panel  Result Value Ref Range   Sodium 137 135 - 145 mmol/L   Potassium 3.6 3.5 - 5.1 mmol/L   Chloride 104 101 - 111 mmol/L   CO2 22 22 - 32 mmol/L   Glucose, Bld 106 (H) 65 - 99 mg/dL   BUN 14 6 - 20 mg/dL   Creatinine, Ser 1.61 0.61 - 1.24 mg/dL   Calcium 8.7 (L) 8.9 - 10.3 mg/dL   Total Protein 6.4 (L) 6.5 - 8.1 g/dL   Albumin 2.9 (L) 3.5 - 5.0 g/dL   AST 47 (H) 15 - 41 U/L   ALT 21 17 - 63 U/L   Alkaline Phosphatase 58 Jeremiah - 126 U/L   Total Bilirubin 1.2 0.3 - 1.2 mg/dL   GFR calc non Af Amer >60 >60 mL/min   GFR calc Af Amer >60 >60 mL/min   Anion gap 11 5 - 15  CBC with Differential  Result Value Ref Range   WBC 4.1 4.0 - 10.5 K/uL   RBC 4.37 4.22 - 5.81 MIL/uL   Hemoglobin 12.7 (L) 13.0 - 17.0 g/dL   HCT 09.6 04.5 - 40.9 %   MCV 91.3 78.0 - 100.0 fL   MCH 29.1 26.0 - 34.0 pg   MCHC 31.8 30.0 - 36.0 g/dL   RDW 81.1 91.4 - 78.2 %   Platelets 201 150 - 400 K/uL   Neutrophils Relative % 72 %   Neutro Abs 2.9 1.7 - 7.7 K/uL   Lymphocytes Relative 22 %   Lymphs Abs 0.9 0.7 - 4.0 K/uL   Monocytes Relative 6 %   Monocytes Absolute 0.2 0.1 - 1.0 K/uL   Eosinophils Relative 0 %   Eosinophils Absolute 0.0 0.0 - 0.7 K/uL   Basophils Relative 0 %   Basophils Absolute 0.0 0.0 - 0.1 K/uL   Imaging Review Dg Chest 2 View  12/13/2015  CLINICAL DATA:  Productive cough.  Chest pain. EXAM: CHEST  2 VIEW COMPARISON:  07/23/2015 FINDINGS: Moderate enlargement of the cardiopericardial silhouette with bilateral abnormal interstitial accentuation favoring the mid lungs and  lung bases. Tortuous thoracic aorta. No pleural effusion. Mild thoracic spondylosis. IMPRESSION: 1. Bilateral abnormal increased perihilar and basilar interstitial accentuation with underlying chronic moderate  enlargement of the cardiopericardial silhouette. Appearance could be due to interstitial edema or atypical pneumonia. Electronically Signed   By: Gaylyn Rong M.D.   On: 12/13/2015 13:02   I have personally reviewed and evaluated these images and lab results as part of my medical decision-making.   EKG Interpretation   Date/Time:  Thursday December 13 2015 14:22:52 EST Ventricular Rate:  129 PR Interval:    QRS Duration: 84 QT Interval:  286 QTC Calculation: 418 R Axis:   128 Text Interpretation:  Atrial fibrillation with rapid ventricular response  with premature ventricular or aberrantly conducted complexes Right axis  deviation Low voltage QRS Cannot rule out Anterior infarct , age  undetermined Abnormal ECG Low voltage, no significant change since last  EkG Confirmed by LIU MD, DANA ([id number]) on 12/13/2015 2:32:10 PM      MDM   Final diagnoses:  COPD exacerbation (HCC)  Elevated troponin I level  Peripheral edema    Cough, dyspnea, vomiting, diarrhea. These may be partly a single viral illness. He does show signs of fluid overload with peripheral edema and chest x-ray suggestive of pulmonary edema. Review of old records confirms history of congestive heart failure as well as bronchitis and asthma. He'll be given additional albuterol with ipratropium and is given ondansetron for nausea and loperamide for diarrhea. Screening labs will be obtained.  5:46 PM There is significant improvement with albuterol with ipratropium with her still residual wheezing. Troponin is come back mildly elevated which is probably a demand ischemia. He also had a mild elevation of troponin with hospitalization last year. Albuterol with ipratropium will be repeated.  He feels considerably better after second nebulizer treatment. He will need to be admitted in light of elevated troponin. Case is discussed with Dr. Adela Glimpse of [facility] who agrees to admit the patient. She has requested CT  angiogram of the chest be obtained and this is been ordered.  Dione Booze, MD 12/13/15 2147

## 2015-12-13 NOTE — ED Notes (Signed)
Pt here for cough, N,V,D. sts since Saturday

## 2015-12-13 NOTE — Progress Notes (Signed)
Patient ID: Jeremiah Perkins, male   DOB: 01-29-1961, 55 y.o.   MRN: [id number]    PCP: Kirt Boys, DO  Advanced Directive information    Allergies  Allergen Reactions  . Eggs Or Egg-Derived Products Nausea And Vomiting    Did ok with flu shot, seems to be an issue with the type of preparation of egg product.  . Sulfa Antibiotics Nausea And Vomiting    Chief Complaint  Patient presents with  . Acute Visit    SOB, cough, runny nose, wheezing, chest pain when he coughs,      HPI: Patient is a 55 y.o. male seen in the office today due to not feeling well. Pt with a pmh of asthma, obesity, CHF, htn, DM. He was at a birthday party this weekend and family member had a bad cold. He has not been able to drink water since for 5 days. Able to keep down water. Not able to sleep due to shortness of breath, wheezing. Increase cough. Coughing up caramel color sputum.  Using xopenex 2 puffs every 4 hours at baseline pt using xopenex twice daily at baseline.  Increased shortness of breath at this time, feeling like breathing has not changed. Can not go anywhere without increased shortness of breath.  Having diarrhea for 4 days.  Decrease appetite, nausea and dry heaves.  Able to keep some down fluids  Has been taking tylenol.   Review of Systems:  Review of Systems  Constitutional: Positive for activity change, appetite change and fatigue. Negative for fever.  Respiratory: Positive for cough, shortness of breath and wheezing.   Cardiovascular: Positive for chest pain (pain with coughing). Negative for palpitations.  Gastrointestinal: Positive for diarrhea. Negative for nausea and abdominal distention.  Skin: Negative for color change.  Neurological: Negative for dizziness and headaches.    Past Medical History  Diagnosis Date  . Hypertension   . Congestive heart failure (HCC)     No echo data available. Myocardial perfusion 2005 with EF 61%, presumed diastolic HF   . Diverticulosis       with history of diverticulitis in 10/2011  . Major depression (HCC)   . Anxiety   . Psychosexual dysfunction with inhibited sexual excitement   . Hyperlipidemia   . Atrial flutter (HCC)   . Morbid obesity (HCC)   . Chronic bronchitis (HCC)     "get it q yr" (01/02/2015)  . Shortness of breath     "all the time" (07/26/2013)  . Chronic back pain   . GERD (gastroesophageal reflux disease)   . Asthma   . Pneumonia 2013; 2014  . OSA (obstructive sleep apnea) 1998    "wear my CPAP a couple times/month" (01/02/2015)  . Diabetic peripheral neuropathy (HCC)   . Type II diabetes mellitus (HCC) dx'd 1991    previously on insulin in 2000 for 3-4 years (but was stopped because adamantly did not want to be on insulin)  . Arthritis     involving knees, ankles, back, elbows (01/02/2015)  . Gout   . Myocardial infarction [facility])     "one doctor said I did back in 2014"  . Cholelithiasis    Past Surgical History  Procedure Laterality Date  . Ankle reconstruction Right 1990's    2/2 trauma sustained after fall  . 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 Cyst removal leg Left 1960's    back of  leg  . Tee without cardioversion N/A 07/28/2013  Procedure: TRANSESOPHAGEAL ECHOCARDIOGRAM (TEE);  Surgeon: Laurey Morale, MD;  Location: [facility] ENDOSCOPY;  Service: Cardiovascular;  Laterality: N/A;  . Cardioversion N/A 07/28/2013    Procedure: CARDIOVERSION;  Surgeon: Laurey Morale, MD;  Location: [facility] ENDOSCOPY;  Service: Cardiovascular;  Laterality: N/A;  . Cardioversion N/A 10/26/2013    Procedure: CARDIOVERSION;  Surgeon: Cassell Clement, MD;  Location: [facility] ENDOSCOPY;  Service: Cardiovascular;  Laterality: N/A;  . Left heart catheterization with coronary angiogram N/A 07/27/2013    Procedure: LEFT HEART CATHETERIZATION WITH CORONARY ANGIOGRAM;  Surgeon: Peter M Swaziland, MD;  Location: [facility] CATH LAB;  Service: Cardiovascular;  Laterality: N/A;  . Cardiac catheterization  1990's; 07/2013  . Finger amputation    .  Cholecystectomy N/A 08/24/2015    Procedure: LAPAROSCOPIC CHOLECYSTECTOMY WITH INTRAOPERATIVE CHOLANGIOGRAM;  Surgeon: Avel Peace, MD;  Location: [facility] ORS;  Service: General;  Laterality: N/A;   Social History:   reports that he has quit smoking. His smoking use included Cigarettes. He has a 1.2 pack-year smoking history. He has never used smokeless tobacco. He reports that he drinks alcohol. He reports that he does not use illicit drugs.  Family History  Problem Relation Age of Onset  . Diabetes Mother   . CAD Mother 31    requiring quadruple bypass  . Hypertension Mother   . Alzheimer's disease Mother   . Stomach cancer Father   . Lung cancer Father     was a smoker  . Gout Father   . CAD Brother 48    requiring quadruple bypass  . Seizures Brother   . Hypertension Brother   . Diabetes Maternal Grandmother   . Alzheimer's disease Maternal Grandmother   . Breast cancer Paternal Aunt   . Diabetes Maternal Aunt   . Seizures Paternal Uncle     Medications: Patient's Medications  New Prescriptions   No medications on file  Previous Medications   CALCIUM CARBONATE (TUMS - DOSED IN MG ELEMENTAL CALCIUM) 500 MG CHEWABLE TABLET    Chew 1 tablet by mouth as needed for indigestion or heartburn. Reported on 12/13/2015   CARVEDILOL (COREG) 6.25 MG TABLET    Take 1 tablet (6.25 mg total) by mouth 2 (two) times daily.   COLCHICINE 0.6 MG TABLET    Take one tablet by mouth once daily for gout prevention   DICLOFENAC SODIUM (VOLTAREN) 1 % GEL    Apply 2 g topically 4 (four) times daily. Reported on 12/13/2015   GABAPENTIN (NEURONTIN) 600 MG TABLET    Take by mouth. Reported on 12/13/2015   LEVALBUTEROL (XOPENEX HFA) 45 MCG/ACT INHALER    Inhale 1-2 puffs into the lungs every 4 (four) hours as needed for wheezing. Reported on 12/13/2015   LOSARTAN (COZAAR) 25 MG TABLET    Take 12.5 mg by mouth daily. Reported on 12/13/2015   METFORMIN (GLUCOPHAGE) 1000 MG TABLET    Take 1 tablet (1,000 mg total)  by mouth daily with breakfast.   ONDANSETRON (ZOFRAN ODT) 8 MG DISINTEGRATING TABLET    Take 1 tablet (8 mg total) by mouth every 8 (eight) hours as needed for nausea or vomiting.   ONDANSETRON (ZOFRAN) 4 MG TABLET    Take 1 tablet (4 mg total) by mouth every 4 (four) hours as needed for nausea.   OXYCODONE (OXY IR/ROXICODONE) 5 MG IMMEDIATE RELEASE TABLET    Take 1-2 tablets (5-10 mg total) by mouth every 4 (four) hours as needed for moderate pain, severe pain or breakthrough pain.   RIVAROXABAN (  XARELTO) 20 MG TABS TABLET    Take 20 mg by mouth daily with supper. Reported on 12/13/2015   SILDENAFIL (VIAGRA) 100 MG TABLET    Take 100 mg by mouth daily as needed for erectile dysfunction. Reported on 12/13/2015   TORSEMIDE (DEMADEX) 20 MG TABLET    Take 20 mg by mouth as directed. Reported on 12/13/2015  Modified Medications   No medications on file  Discontinued Medications   No medications on file     Physical Exam:  Filed Vitals:   12/13/15 1047  BP: 120/80  Pulse: 105  Temp: 97.4 F (36.3 C)  TempSrc: Oral  Resp: 24  Height: 5\' 7"  (1.702 m)  Weight: 285 lb 12.8 oz (129.638 kg)  SpO2: 98%   Body mass index is 44.75 kg/(m^2).  Physical Exam  Constitutional: He is oriented to person, place, and time. He appears well-developed and well-nourished.  HENT:  Mouth/Throat: Oropharynx is clear and moist.  Eyes: Pupils are equal, round, and reactive to light. No scleral icterus.  Neck: Neck supple. Carotid bruit is not present. No thyromegaly present.  Cardiovascular: Normal rate, normal heart sounds and intact distal pulses.  An irregularly irregular rhythm present. Exam reveals no gallop and no friction rub.   No murmur heard. +2 pitting LE edema b/l. No calf TTP  Pulmonary/Chest: He is in respiratory distress (increased work of breathing). He has wheezes (throughout).  Abdominal: Soft. Bowel sounds are normal. He exhibits no abdominal bruit and no pulsatile midline mass. There is no  tenderness.  Musculoskeletal: He exhibits edema.  Lymphadenopathy:    He has no cervical adenopathy.  Neurological: He is alert and oriented to person, place, and time.  Skin: Skin is warm and dry. He is not diaphoretic.    Labs reviewed: Basic Metabolic Panel:  Recent Labs  16/10/96 0315  08/03/15 0901 08/10/15 0939 08/15/15 1125 08/31/15  NA 140  < > 132* 137 142 140  K 3.5  < > 3.7 3.9 4.1 4.7  CL 103  < > 93* 100 101  --   CO2 27  < > 27 27 28   --   GLUCOSE 177*  < > 184* 161* 153*  --   BUN 37*  < > 40* 29* 37* 32*  CREATININE 1.58*  < > 1.24 1.32 1.30* 1.1  CALCIUM 8.9  < > 9.4 8.9 9.3  --   TSH 5.852*  --   --   --   --   --   < > = values in this interval not displayed. Liver Function Tests:  Recent Labs  05/30/15 1208 07/16/15 1415 08/15/15 1125 08/31/15  AST 25 30 36 29  ALT 21 24 29 27   ALKPHOS 96 73 61 82  BILITOT 1.0 1.2 1.1  --   PROT 7.2 7.6 8.0  --   ALBUMIN 4.0 3.5 3.7  --     Recent Labs  01/03/15 0315 02/05/15 1612 07/16/15 1415  LIPASE 35 58 26   No results for input(s): AMMONIA in the last 8760 hours. CBC:  Recent Labs  02/05/15 1550 07/16/15 1415 07/23/15 1427 08/15/15 1125 08/31/15  WBC 9.2 6.6 6.3 9.4 7.7  NEUTROABS 7.6  --  4.4 7.8*  --   HGB 15.7 14.3 13.3 14.5 13.5  HCT 46.3 42.9 39.7 44.3 41  MCV 87.5 90.1 88.8 91.0  --   PLT 265 254 307 324 235   Lipid Panel:  Recent Labs  01/04/15 0518 05/30/15 1208  CHOL 186 160  HDL 33* 40  LDLCALC 113* 88  TRIG 198* 158*  CHOLHDL 5.6 4.0   TSH:  Recent Labs  01/03/15 0315  TSH 5.852*   A1C: Lab Results  Component Value Date   HGBA1C 6.7* 05/30/2015     Assessment/Plan Shortness of breath Asthma with acute exacerbation, unspecified asthma severity CHF Diarrhea with Nausea  Pt with hx of chf, asthma who has been having shortness of breath, wheezing for 4 days. Shortness of breath has worsened, unable to walk across the room without severe shortness of  breath.  Pt using xopenex q 4 hours without much relief. Duoneb given in office. Will send to the hospital for further evaluation of shortness of breath, asthma exacerbation with possible influenza or pneumonia.    Janene Harvey. Biagio Borg  [facility] [phone] 8 am - 5 pm) [phone] (after hours)

## 2015-12-14 ENCOUNTER — Encounter (HOSPITAL_COMMUNITY): Payer: Self-pay | Admitting: General Practice

## 2015-12-14 ENCOUNTER — Inpatient Hospital Stay (HOSPITAL_COMMUNITY): Payer: PPO

## 2015-12-14 DIAGNOSIS — E876 Hypokalemia: Secondary | ICD-10-CM | POA: Diagnosis not present

## 2015-12-14 DIAGNOSIS — I482 Chronic atrial fibrillation: Secondary | ICD-10-CM

## 2015-12-14 DIAGNOSIS — K219 Gastro-esophageal reflux disease without esophagitis: Secondary | ICD-10-CM | POA: Diagnosis not present

## 2015-12-14 DIAGNOSIS — I4892 Unspecified atrial flutter: Secondary | ICD-10-CM | POA: Diagnosis not present

## 2015-12-14 DIAGNOSIS — R0600 Dyspnea, unspecified: Secondary | ICD-10-CM | POA: Diagnosis not present

## 2015-12-14 DIAGNOSIS — J45901 Unspecified asthma with (acute) exacerbation: Secondary | ICD-10-CM | POA: Diagnosis not present

## 2015-12-14 DIAGNOSIS — M1000 Idiopathic gout, unspecified site: Secondary | ICD-10-CM | POA: Diagnosis not present

## 2015-12-14 DIAGNOSIS — Z6841 Body Mass Index (BMI) 40.0 and over, adult: Secondary | ICD-10-CM | POA: Diagnosis not present

## 2015-12-14 DIAGNOSIS — M109 Gout, unspecified: Secondary | ICD-10-CM | POA: Diagnosis not present

## 2015-12-14 DIAGNOSIS — E1122 Type 2 diabetes mellitus with diabetic chronic kidney disease: Secondary | ICD-10-CM | POA: Diagnosis not present

## 2015-12-14 DIAGNOSIS — I10 Essential (primary) hypertension: Secondary | ICD-10-CM | POA: Diagnosis not present

## 2015-12-14 DIAGNOSIS — J441 Chronic obstructive pulmonary disease with (acute) exacerbation: Secondary | ICD-10-CM

## 2015-12-14 DIAGNOSIS — I5022 Chronic systolic (congestive) heart failure: Secondary | ICD-10-CM | POA: Diagnosis not present

## 2015-12-14 DIAGNOSIS — J9601 Acute respiratory failure with hypoxia: Secondary | ICD-10-CM | POA: Diagnosis not present

## 2015-12-14 DIAGNOSIS — E038 Other specified hypothyroidism: Secondary | ICD-10-CM | POA: Diagnosis not present

## 2015-12-14 DIAGNOSIS — E662 Morbid (severe) obesity with alveolar hypoventilation: Secondary | ICD-10-CM | POA: Diagnosis not present

## 2015-12-14 DIAGNOSIS — Z87891 Personal history of nicotine dependence: Secondary | ICD-10-CM | POA: Diagnosis not present

## 2015-12-14 DIAGNOSIS — E118 Type 2 diabetes mellitus with unspecified complications: Secondary | ICD-10-CM

## 2015-12-14 DIAGNOSIS — N182 Chronic kidney disease, stage 2 (mild): Secondary | ICD-10-CM | POA: Diagnosis not present

## 2015-12-14 DIAGNOSIS — R748 Abnormal levels of other serum enzymes: Secondary | ICD-10-CM | POA: Diagnosis not present

## 2015-12-14 DIAGNOSIS — I5023 Acute on chronic systolic (congestive) heart failure: Secondary | ICD-10-CM

## 2015-12-14 DIAGNOSIS — J189 Pneumonia, unspecified organism: Secondary | ICD-10-CM | POA: Diagnosis present

## 2015-12-14 DIAGNOSIS — G4733 Obstructive sleep apnea (adult) (pediatric): Secondary | ICD-10-CM | POA: Diagnosis not present

## 2015-12-14 DIAGNOSIS — I481 Persistent atrial fibrillation: Secondary | ICD-10-CM | POA: Diagnosis not present

## 2015-12-14 DIAGNOSIS — R0602 Shortness of breath: Secondary | ICD-10-CM | POA: Diagnosis not present

## 2015-12-14 DIAGNOSIS — Z9114 Patient's other noncompliance with medication regimen: Secondary | ICD-10-CM | POA: Diagnosis not present

## 2015-12-14 DIAGNOSIS — I480 Paroxysmal atrial fibrillation: Secondary | ICD-10-CM | POA: Diagnosis not present

## 2015-12-14 DIAGNOSIS — Z89022 Acquired absence of left finger(s): Secondary | ICD-10-CM | POA: Diagnosis not present

## 2015-12-14 DIAGNOSIS — Z882 Allergy status to sulfonamides status: Secondary | ICD-10-CM | POA: Diagnosis not present

## 2015-12-14 DIAGNOSIS — R197 Diarrhea, unspecified: Secondary | ICD-10-CM | POA: Diagnosis not present

## 2015-12-14 DIAGNOSIS — I130 Hypertensive heart and chronic kidney disease with heart failure and stage 1 through stage 4 chronic kidney disease, or unspecified chronic kidney disease: Secondary | ICD-10-CM | POA: Diagnosis not present

## 2015-12-14 DIAGNOSIS — R7989 Other specified abnormal findings of blood chemistry: Secondary | ICD-10-CM | POA: Diagnosis not present

## 2015-12-14 DIAGNOSIS — I255 Ischemic cardiomyopathy: Secondary | ICD-10-CM | POA: Diagnosis not present

## 2015-12-14 DIAGNOSIS — R05 Cough: Secondary | ICD-10-CM | POA: Diagnosis not present

## 2015-12-14 DIAGNOSIS — R609 Edema, unspecified: Secondary | ICD-10-CM | POA: Diagnosis not present

## 2015-12-14 DIAGNOSIS — E1142 Type 2 diabetes mellitus with diabetic polyneuropathy: Secondary | ICD-10-CM

## 2015-12-14 DIAGNOSIS — I5043 Acute on chronic combined systolic (congestive) and diastolic (congestive) heart failure: Secondary | ICD-10-CM | POA: Diagnosis not present

## 2015-12-14 DIAGNOSIS — N529 Male erectile dysfunction, unspecified: Secondary | ICD-10-CM | POA: Diagnosis not present

## 2015-12-14 DIAGNOSIS — J440 Chronic obstructive pulmonary disease with acute lower respiratory infection: Secondary | ICD-10-CM | POA: Diagnosis not present

## 2015-12-14 DIAGNOSIS — I252 Old myocardial infarction: Secondary | ICD-10-CM | POA: Diagnosis not present

## 2015-12-14 DIAGNOSIS — I248 Other forms of acute ischemic heart disease: Secondary | ICD-10-CM | POA: Diagnosis not present

## 2015-12-14 DIAGNOSIS — Z7984 Long term (current) use of oral hypoglycemic drugs: Secondary | ICD-10-CM | POA: Diagnosis not present

## 2015-12-14 DIAGNOSIS — R069 Unspecified abnormalities of breathing: Secondary | ICD-10-CM | POA: Diagnosis not present

## 2015-12-14 DIAGNOSIS — Z7901 Long term (current) use of anticoagulants: Secondary | ICD-10-CM | POA: Diagnosis not present

## 2015-12-14 LAB — CBC WITH DIFFERENTIAL/PLATELET
Basophils Absolute: 0 10*3/uL (ref 0.0–0.1)
Basophils Relative: 1 %
EOS ABS: 0 10*3/uL (ref 0.0–0.7)
Eosinophils Relative: 1 %
HEMATOCRIT: 37.2 % — AB (ref 39.0–52.0)
HEMOGLOBIN: 12.2 g/dL — AB (ref 13.0–17.0)
LYMPHS ABS: 0.9 10*3/uL (ref 0.7–4.0)
LYMPHS PCT: 25 %
MCH: 29.9 pg (ref 26.0–34.0)
MCHC: 32.8 g/dL (ref 30.0–36.0)
MCV: 91.2 fL (ref 78.0–100.0)
Monocytes Absolute: 0.4 10*3/uL (ref 0.1–1.0)
Monocytes Relative: 12 %
NEUTROS ABS: 2.4 10*3/uL (ref 1.7–7.7)
NEUTROS PCT: 63 %
Platelets: 183 10*3/uL (ref 150–400)
RBC: 4.08 MIL/uL — AB (ref 4.22–5.81)
RDW: 14.8 % (ref 11.5–15.5)
WBC: 3.8 10*3/uL — AB (ref 4.0–10.5)

## 2015-12-14 LAB — COMPREHENSIVE METABOLIC PANEL
ALT: 19 U/L (ref 17–63)
ANION GAP: 11 (ref 5–15)
AST: 40 U/L (ref 15–41)
Albumin: 2.8 g/dL — ABNORMAL LOW (ref 3.5–5.0)
Alkaline Phosphatase: 56 U/L (ref 38–126)
BUN: 14 mg/dL (ref 6–20)
CHLORIDE: 103 mmol/L (ref 101–111)
CO2: 23 mmol/L (ref 22–32)
CREATININE: 1.02 mg/dL (ref 0.61–1.24)
Calcium: 8.3 mg/dL — ABNORMAL LOW (ref 8.9–10.3)
Glucose, Bld: 146 mg/dL — ABNORMAL HIGH (ref 65–99)
POTASSIUM: 3.4 mmol/L — AB (ref 3.5–5.1)
SODIUM: 137 mmol/L (ref 135–145)
Total Bilirubin: 1 mg/dL (ref 0.3–1.2)
Total Protein: 6.4 g/dL — ABNORMAL LOW (ref 6.5–8.1)

## 2015-12-14 LAB — GLUCOSE, CAPILLARY
GLUCOSE-CAPILLARY: 96 mg/dL (ref 65–99)
GLUCOSE-CAPILLARY: 236 mg/dL — AB (ref 65–99)
GLUCOSE-CAPILLARY: 292 mg/dL — AB (ref 65–99)
Glucose-Capillary: 112 mg/dL — ABNORMAL HIGH (ref 65–99)
Glucose-Capillary: 147 mg/dL — ABNORMAL HIGH (ref 65–99)

## 2015-12-14 LAB — STREP PNEUMONIAE URINARY ANTIGEN: STREP PNEUMO URINARY ANTIGEN: NEGATIVE

## 2015-12-14 LAB — TROPONIN I
TROPONIN I: 0.12 ng/mL — AB (ref <0.031)
Troponin I: 0.16 ng/mL — ABNORMAL HIGH (ref <0.031)
Troponin I: 0.11 ng/mL — ABNORMAL HIGH (ref <0.031)

## 2015-12-14 MED ORDER — PREDNISONE 20 MG PO TABS
40.0000 mg | ORAL_TABLET | Freq: Every day | ORAL | Status: DC
  Administered 2015-12-14 – 2015-12-19 (×6): 40 mg via ORAL
  Filled 2015-12-14 (×6): qty 2

## 2015-12-14 MED ORDER — LEVALBUTEROL HCL 0.63 MG/3ML IN NEBU
0.6300 mg | INHALATION_SOLUTION | RESPIRATORY_TRACT | Status: DC | PRN
  Administered 2015-12-14 – 2015-12-21 (×13): 0.63 mg via RESPIRATORY_TRACT
  Filled 2015-12-14 (×15): qty 3

## 2015-12-14 MED ORDER — INSULIN ASPART 100 UNIT/ML ~~LOC~~ SOLN
0.0000 [IU] | Freq: Every day | SUBCUTANEOUS | Status: DC
  Administered 2015-12-14: 3 [IU] via SUBCUTANEOUS
  Administered 2015-12-15 – 2015-12-17 (×3): 2 [IU] via SUBCUTANEOUS
  Administered 2015-12-18: 5 [IU] via SUBCUTANEOUS
  Administered 2015-12-19 – 2015-12-20 (×2): 2 [IU] via SUBCUTANEOUS

## 2015-12-14 MED ORDER — COLCHICINE 0.6 MG PO TABS
0.6000 mg | ORAL_TABLET | Freq: Every day | ORAL | Status: DC
  Administered 2015-12-14 – 2015-12-21 (×8): 0.6 mg via ORAL
  Filled 2015-12-14 (×8): qty 1

## 2015-12-14 MED ORDER — INSULIN ASPART 100 UNIT/ML ~~LOC~~ SOLN
0.0000 [IU] | Freq: Three times a day (TID) | SUBCUTANEOUS | Status: DC
  Administered 2015-12-14: 2 [IU] via SUBCUTANEOUS
  Administered 2015-12-14: 5 [IU] via SUBCUTANEOUS
  Administered 2015-12-15: 3 [IU] via SUBCUTANEOUS
  Administered 2015-12-15: 5 [IU] via SUBCUTANEOUS
  Administered 2015-12-15 – 2015-12-16 (×2): 3 [IU] via SUBCUTANEOUS
  Administered 2015-12-16: 5 [IU] via SUBCUTANEOUS
  Administered 2015-12-16 – 2015-12-17 (×2): 8 [IU] via SUBCUTANEOUS
  Administered 2015-12-17: 2 [IU] via SUBCUTANEOUS
  Administered 2015-12-17: 3 [IU] via SUBCUTANEOUS
  Administered 2015-12-18 (×2): 8 [IU] via SUBCUTANEOUS
  Administered 2015-12-18: 3 [IU] via SUBCUTANEOUS
  Administered 2015-12-19: 8 [IU] via SUBCUTANEOUS
  Administered 2015-12-19: 3 [IU] via SUBCUTANEOUS
  Administered 2015-12-19: 11 [IU] via SUBCUTANEOUS
  Administered 2015-12-20: 2 [IU] via SUBCUTANEOUS
  Administered 2015-12-20: 8 [IU] via SUBCUTANEOUS
  Administered 2015-12-20 – 2015-12-21 (×2): 5 [IU] via SUBCUTANEOUS

## 2015-12-14 MED ORDER — IPRATROPIUM BROMIDE 0.02 % IN SOLN
0.5000 mg | Freq: Four times a day (QID) | RESPIRATORY_TRACT | Status: DC
  Administered 2015-12-14 – 2015-12-15 (×6): 0.5 mg via RESPIRATORY_TRACT
  Filled 2015-12-14 (×9): qty 2.5

## 2015-12-14 MED ORDER — OXYCODONE HCL 5 MG PO TABS
5.0000 mg | ORAL_TABLET | ORAL | Status: DC | PRN
  Administered 2015-12-16 – 2015-12-20 (×5): 10 mg via ORAL
  Filled 2015-12-14 (×5): qty 2

## 2015-12-14 MED ORDER — ALBUTEROL SULFATE (2.5 MG/3ML) 0.083% IN NEBU
2.5000 mg | INHALATION_SOLUTION | RESPIRATORY_TRACT | Status: AC | PRN
  Administered 2015-12-14: 2.5 mg via RESPIRATORY_TRACT
  Filled 2015-12-14: qty 3

## 2015-12-14 MED ORDER — DEXTROSE 5 % IV SOLN
500.0000 mg | INTRAVENOUS | Status: DC
Start: 2015-12-14 — End: 2015-12-15
  Administered 2015-12-14 – 2015-12-15 (×2): 500 mg via INTRAVENOUS
  Filled 2015-12-14 (×2): qty 500

## 2015-12-14 MED ORDER — PERFLUTREN LIPID MICROSPHERE
1.0000 mL | INTRAVENOUS | Status: AC | PRN
  Administered 2015-12-14: 4 mL via INTRAVENOUS
  Filled 2015-12-14: qty 10

## 2015-12-14 MED ORDER — GABAPENTIN 600 MG PO TABS
600.0000 mg | ORAL_TABLET | Freq: Two times a day (BID) | ORAL | Status: DC
  Administered 2015-12-14 – 2015-12-21 (×15): 600 mg via ORAL
  Filled 2015-12-14 (×15): qty 1

## 2015-12-14 MED ORDER — FUROSEMIDE 10 MG/ML IJ SOLN
40.0000 mg | Freq: Two times a day (BID) | INTRAMUSCULAR | Status: DC
  Administered 2015-12-14 – 2015-12-15 (×3): 40 mg via INTRAVENOUS
  Filled 2015-12-14 (×3): qty 4

## 2015-12-14 MED ORDER — RIVAROXABAN 20 MG PO TABS
20.0000 mg | ORAL_TABLET | Freq: Every day | ORAL | Status: DC
  Administered 2015-12-14 – 2015-12-20 (×7): 20 mg via ORAL
  Filled 2015-12-14 (×7): qty 1

## 2015-12-14 MED ORDER — ZOLPIDEM TARTRATE 5 MG PO TABS
2.5000 mg | ORAL_TABLET | Freq: Every evening | ORAL | Status: DC | PRN
  Administered 2015-12-14 – 2015-12-16 (×3): 2.5 mg via ORAL
  Filled 2015-12-14 (×3): qty 1

## 2015-12-14 MED ORDER — CARVEDILOL 6.25 MG PO TABS
6.2500 mg | ORAL_TABLET | Freq: Two times a day (BID) | ORAL | Status: DC
  Administered 2015-12-14: 6.25 mg via ORAL
  Filled 2015-12-14: qty 1

## 2015-12-14 MED ORDER — LOSARTAN POTASSIUM 25 MG PO TABS
12.5000 mg | ORAL_TABLET | Freq: Every day | ORAL | Status: DC
  Administered 2015-12-14 – 2015-12-21 (×7): 12.5 mg via ORAL
  Filled 2015-12-14 (×8): qty 0.5

## 2015-12-14 MED ORDER — CEFTRIAXONE SODIUM 1 G IJ SOLR
1.0000 g | INTRAMUSCULAR | Status: DC
  Administered 2015-12-14: 1 g via INTRAVENOUS
  Filled 2015-12-14 (×2): qty 10

## 2015-12-14 MED ORDER — CARVEDILOL 12.5 MG PO TABS
12.5000 mg | ORAL_TABLET | Freq: Two times a day (BID) | ORAL | Status: DC
  Administered 2015-12-14 – 2015-12-21 (×14): 12.5 mg via ORAL
  Filled 2015-12-14 (×14): qty 1

## 2015-12-14 NOTE — Progress Notes (Signed)
Pt is mildly SOB after his shower. X/A nebs given. Pt is stable at this time no complications noted. No wheezing noted.

## 2015-12-14 NOTE — Progress Notes (Signed)
Utilization review completed. Malachy Coleman, RN, BSN. 

## 2015-12-14 NOTE — Progress Notes (Signed)
Ask pt did he want to use NIV for the night. Pt stated just make sure water is in there and stated that he didn't need any help setting it up. Sterile water place in reservoir of the CPAP per RRT. Pt is stable at this time.

## 2015-12-14 NOTE — Consult Note (Signed)
CARDIOLOGY CONSULT NOTE   Patient ID: Jeremiah Perkins MRN: [id number], DOB/AGE: 57/24/1962   Admit date: 12/13/2015 Date of Consult: 12/14/2015 Reason for Consult: Elevated Troponin Physician Requesting Consult: Dr. Adela Glimpse   Primary Physician: Kirt Boys, DO Primary Cardiologist: Dr. Patty Sermons  HPI: Jeremiah Perkins is a 55 y.o. male with past medical history of chronic atrial fibrillation (on Xarelto), nonischemic cardiomyopathy (EF 20-25% in 07/2013 with cath showing normal cors, 50-55% in 03/2014), HTN, HLD, Type 2 DM, OSA who presented to [facility] ED on 12/13/2015 for cough, dyspnea, nausea, vomiting, and diarrhea.  While in the ED, he was given a Nebulizer treatment with some improvement in his dyspnea. WBC normal at 4.3. Troponin mildly elevated at 0.15. BNP 197. EKG showed atrial fibrillation with RVR, HR 129. CXR showed Bilateral primarily interstitial accentuation similar to prior, favoring interstitial edema or atypical pneumonia along with stable enlargement of the cardiopericardial silhouette. CTA showed no evidence of PE. Pulmonary nodules were noted, measuring up to 2.2 cm. He was started on Rocephin and Azithromycin for possible CAP.   Cyclic troponin values have been 0.15 and 0.16. On review, his troponin values have been mildly elevated since 2014.  At the time of this evaluation, he reports his breathing has significantly improved since last night. He reports mild chest pain, only when coughing. Denies any chest pain otherwise or prior to admission. Reports being unable to keep his medications down for the past 4 days, which is when he initially developed nausea and vomiting. He reports a visiting relative was sick this weekend and was his likely exposure. Reports having lower extremity edema for the past few months, worse over the past few days since he has been unable to take his Torsemide secondary to vomiting. Denies any recent orthopnea or PND.  Recently seen by  Dr. Patty Sermons on 11/29/2015 and doing well from a Cardiology stand point. He reported issues with erectile dysfunction at that time and has planned follow-up with Urology. EKG showed atrial fibrillation with HR of 96. In regards to his atrial fibrillation, he failed past DCCV and Amiodarone therapy which was stopped in 2016. The current strategy for him is rate control and permanent anticoagulation.   Problem List Past Medical History  Diagnosis Date  . Hypertension   . Congestive heart failure (HCC)     No echo data available. Myocardial perfusion 2005 with EF 61%, presumed diastolic HF   . Diverticulosis     with history of diverticulitis in 10/2011  . Major depression (HCC)   . Anxiety   . Psychosexual dysfunction with inhibited sexual excitement   . Hyperlipidemia   . Atrial flutter (HCC)   . Morbid obesity (HCC)   . Chronic bronchitis (HCC)     "get it q yr" (01/02/2015)  . Shortness of breath     "all the time" (07/26/2013)  . Chronic back pain   . GERD (gastroesophageal reflux disease)   . Asthma   . Pneumonia 2013; 2014  . OSA (obstructive sleep apnea) 1998    "wear my CPAP a couple times/month" (01/02/2015)  . Diabetic peripheral neuropathy (HCC)   . Type II diabetes mellitus (HCC) dx'd 1991    previously on insulin in 2000 for 3-4 years (but was stopped because adamantly did not want to be on insulin)  . Arthritis     involving knees, ankles, back, elbows (01/02/2015)  . Gout   . Myocardial infarction [facility])     "one doctor said I did  back in 2014"  . Cholelithiasis     Past Surgical History  Procedure Laterality Date  . Ankle reconstruction Right 1990's    2/2 trauma sustained after fall  . 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 Cyst removal leg Left 1960's    back of  leg  . Tee without cardioversion N/A 07/28/2013    Procedure: TRANSESOPHAGEAL ECHOCARDIOGRAM (TEE);  Surgeon: Laurey Morale, MD;  Location: [facility] ENDOSCOPY;  Service: Cardiovascular;  Laterality: N/A;  .  Cardioversion N/A 07/28/2013    Procedure: CARDIOVERSION;  Surgeon: Laurey Morale, MD;  Location: [facility] ENDOSCOPY;  Service: Cardiovascular;  Laterality: N/A;  . Cardioversion N/A 10/26/2013    Procedure: CARDIOVERSION;  Surgeon: Cassell Clement, MD;  Location: [facility] ENDOSCOPY;  Service: Cardiovascular;  Laterality: N/A;  . Left heart catheterization with coronary angiogram N/A 07/27/2013    Procedure: LEFT HEART CATHETERIZATION WITH CORONARY ANGIOGRAM;  Surgeon: Peter M Swaziland, MD;  Location: [facility] CATH LAB;  Service: Cardiovascular;  Laterality: N/A;  . Cardiac catheterization  1990's; 07/2013  . Finger amputation    . Cholecystectomy N/A 08/24/2015    Procedure: LAPAROSCOPIC CHOLECYSTECTOMY WITH INTRAOPERATIVE CHOLANGIOGRAM;  Surgeon: Avel Peace, MD;  Location: [facility] ORS;  Service: General;  Laterality: N/A;     Allergies Allergies  Allergen Reactions  . Eggs Or Egg-Derived Products Nausea And Vomiting    Did ok with flu shot, seems to be an issue with the type of preparation of egg product.  . Sulfa Antibiotics Nausea And Vomiting      Inpatient Medications . azithromycin  500 mg Intravenous Q24H  . carvedilol  6.25 mg Oral BID WC  . cefTRIAXone (ROCEPHIN)  IV  1 g Intravenous Q24H  . colchicine  0.6 mg Oral Daily  . furosemide  40 mg Intravenous BID  . gabapentin  600 mg Oral BID  . insulin aspart  0-15 Units Subcutaneous TID WC  . insulin aspart  0-5 Units Subcutaneous QHS  . ipratropium  0.5 mg Nebulization Q6H  . losartan  12.5 mg Oral Daily  . predniSONE  40 mg Oral Q breakfast  . rivaroxaban  20 mg Oral Q supper    Family History Family History  Problem Relation Age of Onset  . Diabetes Mother   . CAD Mother 22    requiring quadruple bypass  . Hypertension Mother   . Alzheimer's disease Mother   . Stomach cancer Father   . Lung cancer Father     was a smoker  . Gout Father   . CAD Brother 48    requiring quadruple bypass  . Seizures Brother   . Hypertension Brother    . Diabetes Maternal Grandmother   . Alzheimer's disease Maternal Grandmother   . Breast cancer Paternal Aunt   . Diabetes Maternal Aunt   . Seizures Paternal Uncle      Social History Social History   Social History  . Marital Status: Married    Spouse Name: N/A  . Number of Children: 3  . Years of Education: 14   Occupational History  . Now unemployed     Barrister's clerk at [employer]  .  [place]   Social History Main Topics  . Smoking status: Former Smoker -- 0.12 packs/day for 10 years    Types: Cigarettes  . Smokeless tobacco: Never Used     Comment: rarely.  . Alcohol Use: 0.0 oz/week    0 Standard drinks or equivalent per week  Comment: OCCASIONAL  . Drug Use: No     Comment: LAST USED DEC 2015  . Sexual Activity: Not on file   Other Topics Concern  . 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 They each have children but none between the two of them.      Diet:      Do you drink/ eat things with caffeine?  Yes      Marital status:   Married                             What year were you married ? 2010      Do you live in a house, apartment,assistred living, condo, trailer, etc.)? House      Is it one or more stories? Yes      How many persons live in your home ? 3      Do you have any pets in your home ?(please list) Dog      Current or past profession: Aircraft Mechanic/Instructor      Do you exercise?  Yes                            Type & how often:  Golf,cut grass, walk      Do you have a living will? No      Do you have a DNR form?   No                    If not, do you want to discuss one?       Do you have signed POA?HPOA forms?   No              If so, please bring to your        appointment           Review of Systems General:  No chills, fever, night sweats or weight changes.  Cardiovascular:  No chest pain, dyspnea on exertion, edema, orthopnea, palpitations,  paroxysmal nocturnal dyspnea. Dermatological: No rash, lesions/masses Respiratory: Positive for dyspnea and cough. Urologic: No hematuria, dysuria Abdominal:   No bright red blood per rectum, melena, or hematemesis. Positive for nausea, vomiting, and diarrhea.  Neurologic:  No visual changes, wkns, changes in mental status. All other systems reviewed and are otherwise negative except as noted above.  Physical Exam Blood pressure 102/75, pulse 112, temperature 98.7 F (37.1 C), temperature source Oral, resp. rate 18, height 5\' 7"  (1.702 m), weight 283 lb 9.6 oz (128.64 kg), SpO2 98 %.  General: Pleasant, obese African American male appearing in NAD Psych: Normal affect. Neuro: Alert and oriented X 3. Moves all extremities spontaneously. HEENT: Normal  Neck: Supple without bruits. JVD unable to be assessed secondary to body habitus. Lungs:  Resp regular and unlabored, minimal rales at bases. No audible wheezing appreciated. Heart: Irregularly irregular, no s3, s4, or murmurs. Abdomen: Soft, non-tender, non-distended, BS + x 4.  Extremities: No clubbing or cyanosis. 1+ edema bilaterally. DP/PT/Radials 2+ and equal bilaterally.  Labs   Recent Labs  12/13/15 1627 12/14/15 0401  TROPONINI 0.15* 0.16*   Lab Results  Component Value Date   WBC 3.8* 12/14/2015   HGB 12.2* 12/14/2015   HCT 37.2* 12/14/2015   MCV 91.2 12/14/2015   PLT 183 12/14/2015  Recent Labs Lab 12/13/15 1627  NA 137  K 3.6  CL 104  CO2 22  BUN 14  CREATININE 0.94  CALCIUM 8.7*  PROT 6.4*  BILITOT 1.2  ALKPHOS 58  ALT 21  AST 47*  GLUCOSE 106*   Lab Results  Component Value Date   CHOL 160 05/30/2015   HDL 40 05/30/2015   LDLCALC 88 05/30/2015   TRIG 158* 05/30/2015    Radiology/Studies  Dg Chest 2 View: 12/13/2015  CLINICAL DATA:  Cough for 5 days. Chest pain. Shortness of breath. COPD. EXAM: CHEST  2 VIEW COMPARISON:  12/13/2015 at 12:33 p.m. FINDINGS: Stable enlargement of the  cardiopericardial silhouette with bilateral interstitial accentuation similar to today's earlier exam. Thoracic spondylosis. IMPRESSION: 1. Bilateral primarily interstitial accentuation similar to prior, favoring interstitial edema or atypical pneumonia. 2. Stable enlargement of the cardiopericardial silhouette. Electronically Signed   By: Gaylyn Rong M.D.   On: 12/13/2015 19:08   Dg Chest 2 View: 12/13/2015  CLINICAL DATA:  Productive cough.  Chest pain. EXAM: CHEST  2 VIEW COMPARISON:  07/23/2015 FINDINGS: Moderate enlargement of the cardiopericardial silhouette with bilateral abnormal interstitial accentuation favoring the mid lungs and lung bases. Tortuous thoracic aorta. No pleural effusion. Mild thoracic spondylosis. IMPRESSION: 1. Bilateral abnormal increased perihilar and basilar interstitial accentuation with underlying chronic moderate enlargement of the cardiopericardial silhouette. Appearance could be due to interstitial edema or atypical pneumonia. Electronically Signed   By: Gaylyn Rong M.D.   On: 12/13/2015 13:02   Ct Angio Chest Pe W/cm &/or Wo Cm: 12/13/2015  CLINICAL DATA:  Acute onset of cough and shortness of breath. Extremity swelling. Generalized weakness and insomnia. Initial encounter. EXAM: CT ANGIOGRAPHY CHEST WITH CONTRAST TECHNIQUE: Multidetector CT imaging of the chest was performed using the standard protocol during bolus administration of intravenous contrast. Multiplanar CT image reconstructions and MIPs were obtained to evaluate the vascular anatomy. CONTRAST:  OMNIPAQUE IOHEXOL 350 MG/ML SOLN COMPARISON:  CT of the chest performed 01/03/2015, and chest radiograph performed earlier today at 6:53 p.m. FINDINGS: There is no evidence of significant pulmonary embolus. Vascular congestion is noted, with bilateral central ground-glass airspace opacities. This most likely reflects pulmonary edema, though infection could have a similar appearance. There is no evidence  of pleural effusion or pneumothorax. No masses are identified; no abnormal focal contrast enhancement is seen. The heart is enlarged. Enlarged mediastinal nodes are seen, measuring up to 1.9 cm in short axis, in the right paratracheal, periaortic, precarinal and subcarinal regions, and at the azygoesophageal recess. Incidental note is made of a retroesophageal right subclavian artery. The great vessels are grossly unremarkable in appearance. No pericardial effusion is identified. No axillary lymphadenopathy is seen. The visualized portions of the thyroid gland are unremarkable in appearance. There is also a 2.2 cm epicardial fat pad node, located relatively superiorly. The visualized portions of the liver and spleen are unremarkable. Perinephric stranding is noted bilaterally. The visualized portions of the pancreas and adrenal glands are within normal limits. No acute osseous abnormalities are seen. Review of the MIP images confirms the above findings. IMPRESSION: 1. No evidence of significant pulmonary embolus. 2. Vascular congestion and cardiomegaly, with bilateral central ground-glass airspace opacities. This most likely reflects pulmonary edema, though infection could have a similar appearance. 3. Enlarged mediastinal nodes are more prominent than on the prior study, measuring up to 1.9 cm in short axis. Some of these would be amenable to biopsy, as deemed clinically appropriate. Relatively superior 2.2 cm epicardial fat  pad node also noted. Electronically Signed   By: Roanna Raider M.D.   On: 12/13/2015 23:37    ECG: Atrial Fibrillation, HR 129, No acute ST or T-wave changes   ECHOCARDIOGRAM: 12/14/2015 Study Conclusions - Left ventricle: The cavity size was normal. Systolic function was moderately reduced. The estimated ejection fraction was in the range of 35% to 40%. Akinesis of the apicalanteroseptal and apical myocardium.  Impressions: - Very technically difficult echo due to body  habitus. We were not able to visualize all cardiac structures.  ASSESSMENT AND PLAN  1. Atrial Fibrillation with RVR - has history of atrial fibrillation, having failed DCCV and Amiodarone therapy in the past. Current strategy is rate control and chronic anticoagulation. - HR in the 90's - 120's in the setting of CAP. Will increase Coreg from 6.25mg  BID to 12.5mg  BID to help with tachycardia.  - This patients CHA2DS2-VASc Score and unadjusted Ischemic Stroke Rate (% per year) is equal to 3.2 % stroke rate/year from a score of 3 (CHF, HTN, DM). Continue Xarelto for anticoagulation.  2. Elevated Troponin - levels have been 0.15 and 0.16 while admitted. Likely to represent demand ischemia in the setting of CAP and atrial fibrillation with RVR.  - reports pleuritic pain with coughing. Denies any other chest pain at this time. - normal coronary arteries on cath in 07/2013. - would not pursue further ischemic workup at this time.  3. Nonischemic Cardiomyopathy/ Chronic Systolic CHF - EF 20-25% in 07/2013 with cath showing normal cors, 50-55% in 03/2014. Echo this admission was technically difficult but showed an estimated EF of 35-40%. - CXR on admission showed bilateral primarily interstitial accentuation similar to prior, favoring interstitial edema or atypical pneumonia along with stable enlargement of the cardiopericardial silhouette. - does have lower extremity edema on exam and mild rales at lung bases bilaterally. - agree with Lasix 40mg  IV BID for now. May need to hold Losartan temporarily if hypotension occurs. - obtain strict I&O's and daily weights. - continue Coreg with dose adjustment as listed above.  4. OSA  - on CPAP  5. CAP - on Abx treatment - per admitting team  6. Pulmonary Nodules - noted on CTA - will need to be followed as outpatient.   Signed, Ellsworth Lennox, PA-C 12/14/2015, 11:32 AM Pager: [phone]  Pt seen and examined  I agree with findings as  noted abov eby B Strader   On exam, pt in NAD  Lungs with basilar crackles Cardiac exam  Irreg Irreg  Ext show 1+ edema  Recommend continued rate control for afib  Adjust as bp tolerates  Volume increased on exam  WOuld use IV lasix.  WIll continue to follow.  Dietrich Pates

## 2015-12-14 NOTE — ED Notes (Signed)
Attempted report 

## 2015-12-14 NOTE — Progress Notes (Signed)
7:57 AM I agree with HPI/GPe and A/P per Dr. Adela Glimpse   54 ?  H/o NICM ef 20-25 % [disinterested ppm]-last cath 07/31/13, last echo 12/2014 poor quality DM ty ii with complication Periph Neuropathy OSA Morbid obesity, Body mass index is 44.41 kg/(m^2).  htn  Erectile dysfucntion Gout Aflutter s/op CV 10/26/13  Admitted to [facility] hospital c Acute respiratory failure-PNA vs AECHF States has had ill contacts and contact with people who've had coughs and colds Feverish recently Also states over the past couple weeks he's had further increased and lower extremity swelling and increase in weight  Usually does not have lower extremity swelling     Patient Active Problem List   Diagnosis Date Noted  . COPD exacerbation (HCC) 12/14/2015  . CAP (community acquired pneumonia) 12/14/2015  . Asthma exacerbation 12/13/2015  . Elevated troponin 12/13/2015  . Acute on chronic systolic heart failure, NYHA class 3 (HCC) 12/13/2015  . Asthma, chronic 11/02/2015  . RUQ abdominal pain 07/25/2015  . Hypotension 07/25/2015  . Primary gout 05/31/2015  . Calculus of gallbladder w/o mention of cholecystitis or obstruction 05/30/2015  . Subclinical hypothyroidism 01/03/2015  . Chronic kidney disease (CKD), stage III (moderate) 01/03/2015  . Abdominal discomfort, epigastric 07/28/2014  . Lower back pain 02/27/2014  . Healthcare maintenance 10/07/2013  . Long term current use of anticoagulant therapy 07/22/2013  . Atrial flutter (HCC) 07/15/2013  . Morbid obesity (HCC) 11/11/2012  . Arthritis   . Major depression (HCC)   . Diabetic peripheral neuropathy (HCC)   . Psychosexual dysfunction with inhibited sexual excitement   . OSA (obstructive sleep apnea)   . Hypertension   . Chronic systolic heart failure (HCC)   . Gout   . Diabetes mellitus type 2, controlled, with complications (HCC) 11/30/1989   Multifactorial dyspnea Diuresis with IV Lasix, treat also for pneumonia although this is a little less  likely. Cardiology input sought regarding elevated troponins and likely demand ischemia. May need to reevaluate whether pacemaker is appropriate Discussed with the patient  [name], MD [facility] (P) [phone]

## 2015-12-14 NOTE — Discharge Instructions (Signed)

## 2015-12-14 NOTE — Progress Notes (Signed)
Patient stated he is having difficulty breathing unless sitting completely upright. Sp02 96% on room air. Lower lung sounds crackles. +1 edema lower legs. Dr. Mahala Menghini made aware and telephone order to give lasix scheduled at 1800 at 1500 instead.

## 2015-12-14 NOTE — H&P (Signed)
PCP: Kirt Boys, DO  [facility] [place]  Referring provider Preston Fleeting   Chief Complaint:   Coughing and shortness of breath and peripheral edema  HPI: Jeremiah Perkins is a 55 y.o. male   has a past medical history of Hypertension; Congestive heart failure (HCC); Diverticulosis; Major depression (HCC); Anxiety; Psychosexual dysfunction with inhibited sexual excitement; Hyperlipidemia; Atrial flutter (HCC); Morbid obesity (HCC); Chronic bronchitis (HCC); Shortness of breath; Chronic back pain; GERD (gastroesophageal reflux disease); Asthma; Pneumonia (2013; 2014); OSA (obstructive sleep apnea) (1998); Diabetic peripheral neuropathy (HCC); Type II diabetes mellitus (HCC) (dx'd 1991); Arthritis; Gout; Myocardial infarction (HCC); and Cholelithiasis.   Presented with URi symptoms for 5 days of runny nose, cough productive of tan color sputum some wheezing. Some chest pain with coughing. Reports diarrhea for the past 4 days no hx of antibiotics. Denies any fever. HE was having some wheezing. Hs known hx of asthma. Reports leg swelling bilaterally he have not been taking all his meds because he had no appetite.   He tried to use his inhalers more often it helped but he was still short of breath. He was seen by PCP today and was told to to go to ER.   In ER: was given nebulizer with some relief. Afebrile WBC 3.1 CTA negative for PE Ground glass appearence edema vs atypical pNA  Hospitalist was called for admission for Asthma exacerbation vs atypical infection with possible mild CHF exacerbation.   Review of Systems:    Pertinent positives include:  fatigue, Bilateral lower extremity swelling chest pain diarrhea, nausea, vomiting,  shortness of breath at rest. dyspnea on exertion,  excess mucus, productive cough,  wheezing. Constitutional:  No weight loss, night sweats, Fevers, chills,weight loss  HEENT:  No headaches, Difficulty swallowing,Tooth/dental problems,Sore throat,  No sneezing,  itching, ear ache, nasal congestion, post nasal drip,  Cardio-vascular:  No , Orthopnea, PND, anasarca, dizziness, palpitations.no  GI:  No heartburn, indigestion, abdominal pain, , change in bowel habits, loss of appetite, melena, blood in stool, hematemesis Resp:    non-productive cough, No coughing up of blood.No change in color of mucus.No Skin:  no rash or lesions. No jaundice GU:  no dysuria, change in color of urine, no urgency or frequency. No straining to urinate.  No flank pain.  Musculoskeletal:  No joint pain or no joint swelling. No decreased range of motion. No back pain.  Psych:  No change in mood or affect. No depression or anxiety. No memory loss.  Neuro: no localizing neurological complaints, no tingling, no weakness, no double vision, no gait abnormality, no slurred speech, no confusion  Otherwise ROS are negative except for above, 10 systems were reviewed  Past Medical History: Past Medical History  Diagnosis Date  . Hypertension   . Congestive heart failure (HCC)     No echo data available. Myocardial perfusion 2005 with EF 61%, presumed diastolic HF   . Diverticulosis     with history of diverticulitis in 10/2011  . Major depression (HCC)   . Anxiety   . Psychosexual dysfunction with inhibited sexual excitement   . Hyperlipidemia   . Atrial flutter (HCC)   . Morbid obesity (HCC)   . Chronic bronchitis (HCC)     "get it q yr" (01/02/2015)  . Shortness of breath     "all the time" (07/26/2013)  . Chronic back pain   . GERD (gastroesophageal reflux disease)   . Asthma   . Pneumonia 2013; 2014  . OSA (obstructive sleep apnea)  1998    "wear my CPAP a couple times/month" (01/02/2015)  . Diabetic peripheral neuropathy (HCC)   . Type II diabetes mellitus (HCC) dx'd 1991    previously on insulin in 2000 for 3-4 years (but was stopped because adamantly did not want to be on insulin)  . Arthritis     involving knees, ankles, back, elbows (01/02/2015)  . Gout   .  Myocardial infarction [facility])     "one doctor said I did back in 2014"  . Cholelithiasis    Past Surgical History  Procedure Laterality Date  . Ankle reconstruction Right 1990's    2/2 trauma sustained after fall  . 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 Cyst removal leg Left 1960's    back of  leg  . Tee without cardioversion N/A 07/28/2013    Procedure: TRANSESOPHAGEAL ECHOCARDIOGRAM (TEE);  Surgeon: Laurey Morale, MD;  Location: [facility] ENDOSCOPY;  Service: Cardiovascular;  Laterality: N/A;  . Cardioversion N/A 07/28/2013    Procedure: CARDIOVERSION;  Surgeon: Laurey Morale, MD;  Location: [facility] ENDOSCOPY;  Service: Cardiovascular;  Laterality: N/A;  . Cardioversion N/A 10/26/2013    Procedure: CARDIOVERSION;  Surgeon: Cassell Clement, MD;  Location: [facility] ENDOSCOPY;  Service: Cardiovascular;  Laterality: N/A;  . Left heart catheterization with coronary angiogram N/A 07/27/2013    Procedure: LEFT HEART CATHETERIZATION WITH CORONARY ANGIOGRAM;  Surgeon: Peter M Swaziland, MD;  Location: [facility] CATH LAB;  Service: Cardiovascular;  Laterality: N/A;  . Cardiac catheterization  1990's; 07/2013  . Finger amputation    . Cholecystectomy N/A 08/24/2015    Procedure: LAPAROSCOPIC CHOLECYSTECTOMY WITH INTRAOPERATIVE CHOLANGIOGRAM;  Surgeon: Avel Peace, MD;  Location: [facility] ORS;  Service: General;  Laterality: N/A;     Medications: Prior to Admission medications   Medication Sig Start Date End Date Taking? Authorizing Provider  calcium carbonate (TUMS - DOSED IN MG ELEMENTAL CALCIUM) 500 MG chewable tablet Chew 1 tablet by mouth as needed for indigestion or heartburn. Reported on 12/13/2015   Yes Historical Provider, MD  carvedilol (COREG) 6.25 MG tablet Take 1 tablet (6.25 mg total) by mouth 2 (two) times daily. 06/19/15  Yes Cassell Clement, MD  colchicine 0.6 MG tablet Take one tablet by mouth once daily for gout prevention 07/03/15  Yes Kirt Boys, DO  diclofenac sodium (VOLTAREN) 1 % GEL Apply 2 g topically  4 (four) times daily. Reported on 12/13/2015   Yes Historical Provider, MD  gabapentin (NEURONTIN) 600 MG tablet Take 600 mg by mouth 2 (two) times daily. Reported on 12/13/2015   Yes Historical Provider, MD  levalbuterol [facility] HFA) 45 MCG/ACT inhaler Inhale 1-2 puffs into the lungs every 4 (four) hours as needed for wheezing. Reported on 12/13/2015   Yes Historical Provider, MD  losartan (COZAAR) 25 MG tablet Take 12.5 mg by mouth daily. Reported on 12/13/2015   Yes Historical Provider, MD  metFORMIN (GLUCOPHAGE) 1000 MG tablet Take 1 tablet (1,000 mg total) by mouth daily with breakfast. 10/09/15  Yes Kirt Boys, DO  oxyCODONE (OXY IR/ROXICODONE) 5 MG immediate release tablet Take 1-2 tablets (5-10 mg total) by mouth every 4 (four) hours as needed for moderate pain, severe pain or breakthrough pain. 11/02/15  Yes Kirt Boys, DO  rivaroxaban (XARELTO) 20 MG TABS tablet Take 20 mg by mouth daily with supper. Reported on 12/13/2015   Yes Historical Provider, MD  sildenafil (VIAGRA) 100 MG tablet Take 100 mg by mouth daily as needed for erectile dysfunction. Reported on 12/13/2015  Yes Historical Provider, MD  torsemide (DEMADEX) 20 MG tablet Take 20 mg by mouth daily. Reported on 12/13/2015    Historical Provider, MD    Allergies:   Allergies  Allergen Reactions  . Eggs Or Egg-Derived Products Nausea And Vomiting    Did ok with flu shot, seems to be an issue with the type of preparation of egg product.  . Sulfa Antibiotics Nausea And Vomiting    Social History:  Ambulatory  Independently  Lives at home a  With family     reports that he has quit smoking. His smoking use included Cigarettes. He has a 1.2 pack-year smoking history. He has never used smokeless tobacco. He reports that he drinks alcohol. He reports that he does not use illicit drugs.    Family History: family history includes Alzheimer's disease in his maternal grandmother and mother; Breast cancer in his paternal aunt;  CAD (age of onset: 29) in his brother; CAD (age of onset: 21) in his mother; Diabetes in his maternal aunt, maternal grandmother, and mother; Gout in his father; Hypertension in his brother and mother; Lung cancer in his father; Seizures in his brother and paternal uncle; Stomach cancer in his father.    Physical Exam: Patient Vitals for the past 24 hrs:  BP Temp Pulse Resp SpO2  12/13/15 2215 112/92 mmHg - - - -  12/13/15 2200 106/69 mmHg - - - -  12/13/15 2145 112/70 mmHg - - - -  12/13/15 1800 (!) 84/65 mmHg - 113 - 93 %  12/13/15 1745 92/71 mmHg - 96 - 98 %  12/13/15 1730 (!) 105/45 mmHg - 112 - 96 %  12/13/15 1715 108/94 mmHg - 120 - 95 %  12/13/15 1700 108/80 mmHg - (!) 121 - 97 %  12/13/15 1645 116/86 mmHg - - - 97 %  12/13/15 1555 - - (!) 59 - 95 %  12/13/15 1553 101/71 mmHg - - - -  12/13/15 1525 - - 119 - -  12/13/15 1435 (!) 125/104 mmHg 98.9 F (37.2 C) 120 24 96 %    1. General:  in No Acute distress 2. Psychological: Alert and Oriented 3. Head/ENT:   Moist  Mucous Membranes                          Head Non traumatic, neck supple                          Normal   Dentition 4. SKIN: normal  Skin turgor,  Skin clean Dry and intact no rash 5. Heart: Regular rate and rhythm no Murmur, Rub or gallop 6. Lungs:some  wheezes bilaterally mild crackles   7. Abdomen: Soft, non-tender, Non distended 8. Lower extremities: no clubbing, cyanosis, trace edema bilateraly 9. Neurologically Grossly intact, moving all 4 extremities equally 10. MSK: Normal range of motion  body mass index is unknown because there is no weight on file.   Labs on Admission:   Results for orders placed or performed during the hospital encounter of 12/13/15 (from the past 24 hour(s))  Brain natriuretic peptide     Status: Abnormal   Collection Time: 12/13/15  4:27 PM  Result Value Ref Range   B Natriuretic Peptide 197.5 (H) 0.0 - 100.0 pg/mL  Troponin I     Status: Abnormal   Collection Time:  12/13/15  4:27 PM  Result Value Ref Range   Troponin I  0.15 (H) <0.031 ng/mL  Comprehensive metabolic panel     Status: Abnormal   Collection Time: 12/13/15  4:27 PM  Result Value Ref Range   Sodium 137 135 - 145 mmol/L   Potassium 3.6 3.5 - 5.1 mmol/L   Chloride 104 101 - 111 mmol/L   CO2 22 22 - 32 mmol/L   Glucose, Bld 106 (H) 65 - 99 mg/dL   BUN 14 6 - 20 mg/dL   Creatinine, Ser 6.04 0.61 - 1.24 mg/dL   Calcium 8.7 (L) 8.9 - 10.3 mg/dL   Total Protein 6.4 (L) 6.5 - 8.1 g/dL   Albumin 2.9 (L) 3.5 - 5.0 g/dL   AST 47 (H) 15 - 41 U/L   ALT 21 17 - 63 U/L   Alkaline Phosphatase 58 38 - 126 U/L   Total Bilirubin 1.2 0.3 - 1.2 mg/dL   GFR calc non Af Amer >60 >60 mL/min   GFR calc Af Amer >60 >60 mL/min   Anion gap 11 5 - 15  CBC with Differential     Status: Abnormal   Collection Time: 12/13/15  4:27 PM  Result Value Ref Range   WBC 4.1 4.0 - 10.5 K/uL   RBC 4.37 4.22 - 5.81 MIL/uL   Hemoglobin 12.7 (L) 13.0 - 17.0 g/dL   HCT 54.0 98.1 - 19.1 %   MCV 91.3 78.0 - 100.0 fL   MCH 29.1 26.0 - 34.0 pg   MCHC 31.8 30.0 - 36.0 g/dL   RDW 47.8 29.5 - 62.1 %   Platelets 201 150 - 400 K/uL   Neutrophils Relative % 72 %   Neutro Abs 2.9 1.7 - 7.7 K/uL   Lymphocytes Relative 22 %   Lymphs Abs 0.9 0.7 - 4.0 K/uL   Monocytes Relative 6 %   Monocytes Absolute 0.2 0.1 - 1.0 K/uL   Eosinophils Relative 0 %   Eosinophils Absolute 0.0 0.0 - 0.7 K/uL   Basophils Relative 0 %   Basophils Absolute 0.0 0.0 - 0.1 K/uL    UA not obtained  Lab Results  Component Value Date   HGBA1C 6.7* 05/30/2015    Estimated Creatinine Clearance: 113.4 mL/min (by C-G formula based on Cr of 0.94).  BNP (last 3 results) No results for input(s): PROBNP in the last 8760 hours.  Other results:  I have pearsonaly reviewed this: ECG REPORT  Rate:129  Rhythm: Atrial fibrillation ST&T Change: No ischemic changes small QRS QTC within normal limits  There were no vitals filed for this  visit.   Cultures:    Component Value Date/Time   SDES URINE, CLEAN CATCH 07/16/2015 1845   SPECREQUEST NONE 07/16/2015 1845   CULT  07/16/2015 1845    NO GROWTH 2 DAYS Performed at [facility]    REPTSTATUS 07/18/2015 FINAL 07/16/2015 1845     Radiological Exams on Admission: Dg Chest 2 View  12/13/2015  CLINICAL DATA:  Cough for 5 days. Chest pain. Shortness of breath. COPD. EXAM: CHEST  2 VIEW COMPARISON:  12/13/2015 at 12:33 p.m. FINDINGS: Stable enlargement of the cardiopericardial silhouette with bilateral interstitial accentuation similar to today's earlier exam. Thoracic spondylosis. IMPRESSION: 1. Bilateral primarily interstitial accentuation similar to prior, favoring interstitial edema or atypical pneumonia. 2. Stable enlargement of the cardiopericardial silhouette. Electronically Signed   By: Gaylyn Rong M.D.   On: 12/13/2015 19:08   Dg Chest 2 View  12/13/2015  CLINICAL DATA:  Productive cough.  Chest pain. EXAM: CHEST  2 VIEW COMPARISON:  07/23/2015 FINDINGS: Moderate enlargement of the cardiopericardial silhouette with bilateral abnormal interstitial accentuation favoring the mid lungs and lung bases. Tortuous thoracic aorta. No pleural effusion. Mild thoracic spondylosis. IMPRESSION: 1. Bilateral abnormal increased perihilar and basilar interstitial accentuation with underlying chronic moderate enlargement of the cardiopericardial silhouette. Appearance could be due to interstitial edema or atypical pneumonia. Electronically Signed   By: Gaylyn Rong M.D.   On: 12/13/2015 13:02   Ct Angio Chest Pe W/cm &/or Wo Cm  12/13/2015  CLINICAL DATA:  Acute onset of cough and shortness of breath. Extremity swelling. Generalized weakness and insomnia. Initial encounter. EXAM: CT ANGIOGRAPHY CHEST WITH CONTRAST TECHNIQUE: Multidetector CT imaging of the chest was performed using the standard protocol during bolus administration of intravenous contrast. Multiplanar CT  image reconstructions and MIPs were obtained to evaluate the vascular anatomy. CONTRAST:  OMNIPAQUE IOHEXOL 350 MG/ML SOLN COMPARISON:  CT of the chest performed 01/03/2015, and chest radiograph performed earlier today at 6:53 p.m. FINDINGS: There is no evidence of significant pulmonary embolus. Vascular congestion is noted, with bilateral central ground-glass airspace opacities. This most likely reflects pulmonary edema, though infection could have a similar appearance. There is no evidence of pleural effusion or pneumothorax. No masses are identified; no abnormal focal contrast enhancement is seen. The heart is enlarged. Enlarged mediastinal nodes are seen, measuring up to 1.9 cm in short axis, in the right paratracheal, periaortic, precarinal and subcarinal regions, and at the azygoesophageal recess. Incidental note is made of a retroesophageal right subclavian artery. The great vessels are grossly unremarkable in appearance. No pericardial effusion is identified. No axillary lymphadenopathy is seen. The visualized portions of the thyroid gland are unremarkable in appearance. There is also a 2.2 cm epicardial fat pad node, located relatively superiorly. The visualized portions of the liver and spleen are unremarkable. Perinephric stranding is noted bilaterally. The visualized portions of the pancreas and adrenal glands are within normal limits. No acute osseous abnormalities are seen. Review of the MIP images confirms the above findings. IMPRESSION: 1. No evidence of significant pulmonary embolus. 2. Vascular congestion and cardiomegaly, with bilateral central ground-glass airspace opacities. This most likely reflects pulmonary edema, though infection could have a similar appearance. 3. Enlarged mediastinal nodes are more prominent than on the prior study, measuring up to 1.9 cm in short axis. Some of these would be amenable to biopsy, as deemed clinically appropriate. Relatively superior 2.2 cm epicardial  fat pad node also noted. Electronically Signed   By: Roanna Raider M.D.   On: 12/13/2015 23:37    Chart has been reviewed  Family not at  Bedside    Assessment/Plan  55 year old gentleman with history of paroxysmal atrial fibrillation, diabetes, diastolic heart failure and COPD/asthma presents with worsening shortness of breath coughing and wheezing evidence of peripheral edema since he discontinued his home medications was noted to have elevated troponin but is chest pain-free likely secondary to demand ischemia  Present on Admission:  . CAP (community acquired pneumonia) CT scan of the chest showing possibility of inflammatory versus edematous process given cough productive of brown sputum worrisome for infectious etiology will cover with Rocephin and azithromycin  Atrial fibrillation continue anticoagulation given chads score of 4, continue rate control with Coreg patient has not been taking his medications will restart who've this will help with rate control would avoid albuterol tachycardic and use Xopenex  . Diabetes mellitus type 2, controlled, with complications (HCC) stable continue home medications  . Diabetic peripheral neuropathy (HCC) continue Neurontin  .  OSA (obstructive sleep apnea) ordered CPAP  . Asthma exacerbation/COPD exacerbation (HCC) mild will make sure he is on Xopenex when necessary and Atrovent. Prednisone 40 a day and monitor respiratory status  . Elevated troponin in a setting of mild CHF exacerbation noncompliance at home medication likely demand ischemia currently no chest pain we will continue to obtain serial troponins cardiology consult in the morning  . Acute on chronic systolic heart failure, NYHA class 3 (HCC) - - admit on telemetry, cycle cardiac enzymes, obtain serial ECG, to evaluate for ischemia as a cause of heart failure  monitor daily weight  diurese with IV lasix and monitor orthostatics and creatinine to avoid over diuresis.  Order echogram to  evaluate EF and valves Make sure patient is on ACE/ARBi   cardiology consult   Prophylaxis: xarelto   CODE STATUS:  FULL CODE  as per patient   Disposition:   To home once workup is complete and patient is stable  Other plan as per orders.  I have spent a total of 65 min on this admission  Kyerra Vargo 12/14/2015, 3:24 AM   [facility]  Pager [phone]   after 2 AM please page floor coverage PA If 7AM-7PM, please contact the day team taking care of the patient  [email or web address]  Password [email or web address]

## 2015-12-14 NOTE — Progress Notes (Signed)
Patient requests for sleeping med. On call Lynch MD paged and notified.

## 2015-12-14 NOTE — Progress Notes (Signed)
Placed patient on CPAP auto mode 5-20cm H20 via a large full face mask.  Patient is tolerating well at this time.

## 2015-12-14 NOTE — Progress Notes (Signed)
  Echocardiogram 2D Echocardiogram has been performed.  Delcie Roch 12/14/2015, 11:31 AM

## 2015-12-15 LAB — BASIC METABOLIC PANEL
ANION GAP: 9 (ref 5–15)
BUN: 24 mg/dL — ABNORMAL HIGH (ref 6–20)
CHLORIDE: 104 mmol/L (ref 101–111)
CO2: 23 mmol/L (ref 22–32)
Calcium: 8.6 mg/dL — ABNORMAL LOW (ref 8.9–10.3)
Creatinine, Ser: 1.2 mg/dL (ref 0.61–1.24)
GFR calc non Af Amer: >60 mL/min (ref >60)
Glucose, Bld: 198 mg/dL — ABNORMAL HIGH (ref 65–99)
POTASSIUM: 4 mmol/L (ref 3.5–5.1)
Sodium: 136 mmol/L (ref 135–145)

## 2015-12-15 LAB — GLUCOSE, CAPILLARY
GLUCOSE-CAPILLARY: 196 mg/dL — AB (ref 65–99)
GLUCOSE-CAPILLARY: 204 mg/dL — AB (ref 65–99)
GLUCOSE-CAPILLARY: 222 mg/dL — AB (ref 65–99)
Glucose-Capillary: 176 mg/dL — ABNORMAL HIGH (ref 65–99)

## 2015-12-15 LAB — HEMOGLOBIN A1C
HEMOGLOBIN A1C: 7.5 % — AB (ref 4.8–5.6)
MEAN PLASMA GLUCOSE: 169 mg/dL

## 2015-12-15 MED ORDER — IPRATROPIUM BROMIDE 0.02 % IN SOLN
0.5000 mg | Freq: Two times a day (BID) | RESPIRATORY_TRACT | Status: DC
  Administered 2015-12-16 – 2015-12-21 (×11): 0.5 mg via RESPIRATORY_TRACT
  Filled 2015-12-15 (×11): qty 2.5

## 2015-12-15 MED ORDER — PANTOPRAZOLE SODIUM 40 MG PO TBEC
40.0000 mg | DELAYED_RELEASE_TABLET | Freq: Every day | ORAL | Status: DC
  Administered 2015-12-15 – 2015-12-19 (×5): 40 mg via ORAL
  Filled 2015-12-15 (×5): qty 1

## 2015-12-15 MED ORDER — FUROSEMIDE 10 MG/ML IJ SOLN
80.0000 mg | Freq: Once | INTRAMUSCULAR | Status: AC
  Administered 2015-12-15: 80 mg via INTRAVENOUS
  Filled 2015-12-15: qty 8

## 2015-12-15 MED ORDER — LEVOFLOXACIN 500 MG PO TABS
500.0000 mg | ORAL_TABLET | Freq: Every day | ORAL | Status: DC
  Administered 2015-12-15 – 2015-12-17 (×3): 500 mg via ORAL
  Filled 2015-12-15 (×3): qty 1

## 2015-12-15 MED ORDER — FUROSEMIDE 10 MG/ML IJ SOLN
40.0000 mg | Freq: Two times a day (BID) | INTRAMUSCULAR | Status: DC
  Administered 2015-12-16: 40 mg via INTRAVENOUS
  Filled 2015-12-15 (×2): qty 4

## 2015-12-15 NOTE — Progress Notes (Signed)
Neb not given pt is resting well at this time on his CPAP.  No distress or complications noted.

## 2015-12-15 NOTE — Progress Notes (Signed)
Pt c/o indigestion. Paged Dr Mahala Menghini. Waiting to hear back from dr.

## 2015-12-15 NOTE — Progress Notes (Signed)
Came to assess pt and see if patient was ready for NIV for the night. Pt wife is at bedside with him. Pt stated that he wasn't ready for bed yet. Sterile water was placed in the reservoir of the device and pressure was titrated to the appropriate pressure that was toleratable for him. Pt stated once he is ready for bed he will use device. Instructed pt that device is ready for use all he has to do is press button and it is ready. Pt is stable at this time.

## 2015-12-15 NOTE — Care Management Note (Signed)
Case Management Note  Patient Details  Name: Jeremiah Perkins MRN: [id number] Date of Birth: [birth year]  Subjective/Objective:                  Admitted with Acute respiratory failure-PNA vs AECHF. From home with wife. Independent with ADL's pta. No DME usage.   Action/Plan:  Return to home when medically stable. CM to f/u with d/cneeds. Expected Discharge Date:                  Expected Discharge Plan:  Home/Self Care  In-House Referral:     Discharge planning Services  CM Consult  Post Acute Care Choice:    Choice offered to:     DME Arranged:    DME Agency:     HH Arranged:    HH Agency:     Status of Service:  In process, will continue to follow  Medicare Important Message Given:    Date Medicare IM Given:    Medicare IM give by:    Date Additional Medicare IM Given:    Additional Medicare Important Message give by:     If discussed at Long Length of Stay Meetings, dates discussed:    Additional Comments:     Sayge Mcdonnell (Spouse)  [phone]  Gae Gallop [place], [place] [phone] 12/15/2015, 12:35 PM

## 2015-12-15 NOTE — Progress Notes (Signed)
Jeremiah Perkins ZOX:[id number] DOB: 14-Nov-1961 DOA: 12/13/2015 PCP: Kirt Boys, DO  Brief narrative:  53 ? 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  htn  Erectile dysfucntion Gout   Admitted to [facility] hospital c Acute respiratory failure-PNA vs AECHF States has had ill contacts and contact with people who've had coughs and colds Feverish recently Also states over the past couple weeks he's had further increased and lower extremity swelling and increase in weight  Usually does not have lower extremity swelling   Past medical history-As per Problem list Chart reviewed as below- Reviewed  Consultants:  Cardiology  Procedures:  None  Antibiotics:  Ceftriaxone 1/19  Azithromycin 1/19    Subjective  Still somewhat short of breath Positional changes including laying back seems to make symptoms worse Still feeling swollen and states not passing much urine No chest pain No nausea No vomiting No blurred vision Eating okay No diarrhea    Objective    Interim History: None  Telemetry: Sinus   Objective: Filed Vitals:   12/15/15 0232 12/15/15 0404 12/15/15 0406 12/15/15 0601  BP:    100/67  Pulse:    74  Temp: 97.3 F (36.3 C)   97.6 F (36.4 C)  TempSrc:      Resp:    20  Height:      Weight:  128.64 kg (283 lb 9.6 oz)    SpO2:   97% 100%    Intake/Output Summary (Last 24 hours) at 12/15/15 0841 Last data filed at 12/15/15 0630  Gross per 24 hour  Intake   1570 ml  Output    900 ml  Net    670 ml    Exam:  General: Alert obese pleasant oriented no apparent distress no JVD no bruit Cardiovascular: S1-S2 no murmur rub or gallop Respiratory: Clinically clear no added sound Abdomen: Obese nontender nondistended no rebound Skin grade 1-2 lower extremity  edema nonpitting  Neuro intact moving all 4 limbs equally  Data Reviewed: Basic Metabolic Panel:  Recent Labs Lab 12/13/15 1237 12/13/15 1627 12/14/15 1056 12/15/15 0545  NA 136 137 137 136  K 3.5 3.6 3.4* 4.0  CL 101 104 103 104  CO2 GLUCOSE 107* 106* 146* 198*  BUN 24*  CREATININE 0.95 0.94 1.02 1.20  CALCIUM 8.7* 8.7* 8.3* 8.6*   Liver Function Tests:  Recent Labs Lab 12/13/15 1237 12/13/15 1627 12/14/15 1056  AST 49* 47* 40  ALT ALKPHOS 60 58 56  BILITOT 1.2 1.2 1.0  PROT 6.6 6.4* 6.4*  ALBUMIN 2.9* 2.9* 2.8*   No results for input(s): LIPASE, AMYLASE in the last 168 hours. No results for input(s): AMMONIA in the last 168 hours. CBC:  Recent Labs Lab 12/13/15 1237 12/13/15 1627 12/14/15 1056  WBC 4.3 4.1 3.8*  NEUTROABS  --  2.9 2.4  HGB 12.4* 12.7* 12.2*  HCT 39.2 39.9 37.2*  MCV 92.2 91.3 91.2  PLT 217 201 183   Cardiac Enzymes:  Recent Labs Lab 12/13/15 1627 12/14/15 0401 12/14/15 1056 12/14/15 1448  TROPONINI 0.15* 0.16* 0.12* 0.11*   BNP: Invalid input(s): POCBNP CBG:  Recent Labs Lab 12/14/15 0745 12/14/15 1137 12/14/15 1735 12/14/15 2111 12/15/15 0805  GLUCAP 112* 147* 236* 292* 176*    No results found  for this or any previous visit (from the past 240 hour(s)).   Studies:              All Imaging reviewed and is as per above notation   Scheduled Meds: . azithromycin  500 mg Intravenous Q24H  . carvedilol  12.5 mg Oral BID WC  . cefTRIAXone (ROCEPHIN)  IV  1 g Intravenous Q24H  . colchicine  0.6 mg Oral Daily  . furosemide  40 mg Intravenous BID  . gabapentin  600 mg Oral BID  . insulin aspart  0-15 Units Subcutaneous TID WC  . insulin aspart  0-5 Units Subcutaneous QHS  . ipratropium  0.5 mg Nebulization Q6H  . losartan  12.5 mg Oral Daily  . predniSONE  40 mg Oral Q breakfast  . rivaroxaban  20 mg Oral Q supper   Continuous Infusions:    Assessment/Plan:  1. Acute  respiratory failure secondary to Multifactorial dyspnea-CT performed cannot confirm either pneumonia nor heart failure and as he had a fever and had sputum would be reasonable to treat for pneumonia in addition to heart failure 2. Community-acquired pneumonia-transitioned ceftriaxone and azithromycin to by mouth Levaquin, complete course of antibiotics 1/23 3. Acute exacerbation of nonischemic cardiomyopathy and chronic systolic heart failure-EF this admission 35-40%. Continue Lasix 40 IV twice a day--> note that his home medication as torsemide 20 daily so may need to increase dose?Marland Kitchen Ins and outs are not accurate and he is again 1.3 liters?Marland Kitchen His weight is stable. I have encouraged fluid restriction 1500 cc, continue losartan 12.5 daily, Coreg 12.5 twice a day.  He will need further diuresis. 4. Paroxysmal atrial flutter status post DCC V 2014-continue Coreg. Discontinue telemetry. Continue Xarelto 20 qd 5. Obstructive sleep apnea-continue BiPAP 6. Obesity-BMI is 44-patient should be encouraged regarding weight loss and may benefit from gastric outlet procedure 7. 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 At home is on metformin 1 g twice a day which we will hold for now 8. Erectile dysfunction-hold by gravida 100 mg when necessary for now 9. Gout-continue colchicine 0.6 daily  No family present Presumed full code Inpatient   [name], MD  [facility] Pager [phone] 12/15/2015, 8:41 AM    LOS: 1 day

## 2015-12-15 NOTE — Progress Notes (Signed)
SUBJECTIVE: The patient is doing well today.  At this time, he denies chest pain or any new concerns.  Does complain of shortness of breath.  Says that he gets SOB when walking to the door and when sitting back in the chair.  Did sit back when I was in the room and had coughing fit for approximately 30 seconds.  . carvedilol  12.5 mg Oral BID WC  . colchicine  0.6 mg Oral Daily  . furosemide  40 mg Intravenous BID  . gabapentin  600 mg Oral BID  . insulin aspart  0-15 Units Subcutaneous TID WC  . insulin aspart  0-5 Units Subcutaneous QHS  . ipratropium  0.5 mg Nebulization Q6H  . levofloxacin  500 mg Oral Daily  . losartan  12.5 mg Oral Daily  . predniSONE  40 mg Oral Q breakfast  . rivaroxaban  20 mg Oral Q supper      OBJECTIVE: Physical Exam: Filed Vitals:   12/15/15 0406 12/15/15 0601 12/15/15 0920 12/15/15 1006  BP:  100/67 117/88   Pulse:  74 111   Temp:  97.6 F (36.4 C)    TempSrc:      Resp:  20    Height:      Weight:      SpO2: 97% 100% 100% 97%    Intake/Output Summary (Last 24 hours) at 12/15/15 1242 Last data filed at 12/15/15 1100  Gross per 24 hour  Intake   1450 ml  Output    900 ml  Net    550 ml    Telemetry reveals sinus rhythm  GEN- The patient is well appearing, alert and oriented x 3 today.   Head- normocephalic, atraumatic Eyes-  Sclera clear, conjunctiva pink Ears- hearing intact Oropharynx- clear Neck- supple, no JVP Lymph- no cervical lymphadenopathy Lungs- mild crackles, normal work of breathing Heart- irreglar rhythm, no murmurs, rubs or gallops, PMI not laterally displaced GI- soft, NT, ND, + BS Extremities- no clubbing, cyanosis, or edema Skin- no rash or lesion Psych- euthymic mood, full affect Neuro- strength and sensation are intact  LABS: Basic Metabolic Panel:  Recent Labs  19/14/78 1056 12/15/15 0545  NA 137 136  K 3.4* 4.0  CL 103 104  CO2 23 23  GLUCOSE 146* 198*  BUN 14 24*  CREATININE 1.02 1.20    CALCIUM 8.3* 8.6*   Liver Function Tests:  Recent Labs  12/13/15 1627 12/14/15 1056  AST 47* 40  ALT 21 19  ALKPHOS 58 56  BILITOT 1.2 1.0  PROT 6.4* 6.4*  ALBUMIN 2.9* 2.8*   No results for input(s): LIPASE, AMYLASE in the last 72 hours. CBC:  Recent Labs  12/13/15 1627 12/14/15 1056  WBC 4.1 3.8*  NEUTROABS 2.9 2.4  HGB 12.7* 12.2*  HCT 39.9 37.2*  MCV 91.3 91.2  PLT 201 183   Cardiac Enzymes:  Recent Labs  12/14/15 0401 12/14/15 1056 12/14/15 1448  TROPONINI 0.16* 0.12* 0.11*   BNP: Invalid input(s): POCBNP D-Dimer: No results for input(s): DDIMER in the last 72 hours. Hemoglobin A1C:  Recent Labs  12/14/15 0404  HGBA1C 7.5*   Fasting Lipid Panel: No results for input(s): CHOL, HDL, LDLCALC, TRIG, CHOLHDL, LDLDIRECT in the last 72 hours. Thyroid Function Tests: No results for input(s): TSH, T4TOTAL, T3FREE, THYROIDAB in the last 72 hours.  Invalid input(s): FREET3 Anemia Panel: No results for input(s): VITAMINB12, FOLATE, FERRITIN, TIBC, IRON, RETICCTPCT in the last 72 hours.  RADIOLOGY: Dg Chest 2  View  12/13/2015  CLINICAL DATA:  Cough for 5 days. Chest pain. Shortness of breath. COPD. EXAM: CHEST  2 VIEW COMPARISON:  12/13/2015 at 12:33 p.m. FINDINGS: Stable enlargement of the cardiopericardial silhouette with bilateral interstitial accentuation similar to today's earlier exam. Thoracic spondylosis. IMPRESSION: 1. Bilateral primarily interstitial accentuation similar to prior, favoring interstitial edema or atypical pneumonia. 2. Stable enlargement of the cardiopericardial silhouette. Electronically Signed   By: Gaylyn Rong M.D.   On: 12/13/2015 19:08   Dg Chest 2 View  12/13/2015  CLINICAL DATA:  Productive cough.  Chest pain. EXAM: CHEST  2 VIEW COMPARISON:  07/23/2015 FINDINGS: Moderate enlargement of the cardiopericardial silhouette with bilateral abnormal interstitial accentuation favoring the mid lungs and lung bases. Tortuous  thoracic aorta. No pleural effusion. Mild thoracic spondylosis. IMPRESSION: 1. Bilateral abnormal increased perihilar and basilar interstitial accentuation with underlying chronic moderate enlargement of the cardiopericardial silhouette. Appearance could be due to interstitial edema or atypical pneumonia. Electronically Signed   By: Gaylyn Rong M.D.   On: 12/13/2015 13:02   Ct Angio Chest Pe W/cm &/or Wo Cm  12/13/2015  CLINICAL DATA:  Acute onset of cough and shortness of breath. Extremity swelling. Generalized weakness and insomnia. Initial encounter. EXAM: CT ANGIOGRAPHY CHEST WITH CONTRAST TECHNIQUE: Multidetector CT imaging of the chest was performed using the standard protocol during bolus administration of intravenous contrast. Multiplanar CT image reconstructions and MIPs were obtained to evaluate the vascular anatomy. CONTRAST:  OMNIPAQUE IOHEXOL 350 MG/ML SOLN COMPARISON:  CT of the chest performed 01/03/2015, and chest radiograph performed earlier today at 6:53 p.m. FINDINGS: There is no evidence of significant pulmonary embolus. Vascular congestion is noted, with bilateral central ground-glass airspace opacities. This most likely reflects pulmonary edema, though infection could have a similar appearance. There is no evidence of pleural effusion or pneumothorax. No masses are identified; no abnormal focal contrast enhancement is seen. The heart is enlarged. Enlarged mediastinal nodes are seen, measuring up to 1.9 cm in short axis, in the right paratracheal, periaortic, precarinal and subcarinal regions, and at the azygoesophageal recess. Incidental note is made of a retroesophageal right subclavian artery. The great vessels are grossly unremarkable in appearance. No pericardial effusion is identified. No axillary lymphadenopathy is seen. The visualized portions of the thyroid gland are unremarkable in appearance. There is also a 2.2 cm epicardial fat pad node, located relatively  superiorly. The visualized portions of the liver and spleen are unremarkable. Perinephric stranding is noted bilaterally. The visualized portions of the pancreas and adrenal glands are within normal limits. No acute osseous abnormalities are seen. Review of the MIP images confirms the above findings. IMPRESSION: 1. No evidence of significant pulmonary embolus. 2. Vascular congestion and cardiomegaly, with bilateral central ground-glass airspace opacities. This most likely reflects pulmonary edema, though infection could have a similar appearance. 3. Enlarged mediastinal nodes are more prominent than on the prior study, measuring up to 1.9 cm in short axis. Some of these would be amenable to biopsy, as deemed clinically appropriate. Relatively superior 2.2 cm epicardial fat pad node also noted. Electronically Signed   By: Roanna Raider M.D.   On: 12/13/2015 23:37    ASSESSMENT AND PLAN:  Active Problems:   Diabetes mellitus type 2, controlled, with complications (HCC)   Diabetic peripheral neuropathy (HCC)   OSA (obstructive sleep apnea)   Long term current use of anticoagulant therapy   Asthma exacerbation   Elevated troponin   Acute on chronic systolic heart failure, NYHA class 3 (  HCC)   COPD exacerbation (HCC)   CAP (community acquired pneumonia) Atrial fibrillation: better controlled with increased dose of coreg.  Would titrate up if necessary.  Elevated troponin: likely due to demand ischemia  Nonischemic cardiomyopathy: EF 35-40%.  CXR and CT both show edema.  Not diuresing well with 40 lasix, will try 80 this evening as a one time dose.  Should the higher dose work, may want to increase every dose to 80.  Will Jorja Loa, MD 12/15/2015 12:42 PM

## 2015-12-15 NOTE — Progress Notes (Signed)
CM received consult:Heart failure home health screen and may place order for PT/OT eval and treat if indicated. CM spoke with pt regarding CHF home screen. Pt states he's familiar with HF teaching and has been education on subject. States he has seen  HF video and owns scales. Pt states he knows the importance of weighing daily . CM encouraged pt to refer to CHF packet for reinforcement  In managing  his heart failure. Pt is independent with ADL's, no need for PT/OT evaluations. Gae Gallop RN,BSN,CM [phone]

## 2015-12-16 LAB — BASIC METABOLIC PANEL
ANION GAP: 12 (ref 5–15)
Anion gap: 9 (ref 5–15)
BUN: 29 mg/dL — ABNORMAL HIGH (ref 6–20)
BUN: 26 mg/dL — ABNORMAL HIGH (ref 6–20)
CALCIUM: 8.7 mg/dL — AB (ref 8.9–10.3)
CALCIUM: 8.9 mg/dL (ref 8.9–10.3)
CO2: 22 mmol/L (ref 22–32)
CO2: 25 mmol/L (ref 22–32)
CREATININE: 1 mg/dL (ref 0.61–1.24)
Chloride: 101 mmol/L (ref 101–111)
Chloride: 103 mmol/L (ref 101–111)
Creatinine, Ser: 1.05 mg/dL (ref 0.61–1.24)
GFR calc non Af Amer: >60 mL/min (ref >60)
Glucose, Bld: 239 mg/dL — ABNORMAL HIGH (ref 65–99)
Glucose, Bld: 226 mg/dL — ABNORMAL HIGH (ref 65–99)
Potassium: 3.7 mmol/L (ref 3.5–5.1)
Potassium: 4.1 mmol/L (ref 3.5–5.1)
SODIUM: 135 mmol/L (ref 135–145)
SODIUM: 137 mmol/L (ref 135–145)

## 2015-12-16 LAB — GLUCOSE, CAPILLARY
GLUCOSE-CAPILLARY: 185 mg/dL — AB (ref 65–99)
GLUCOSE-CAPILLARY: 237 mg/dL — AB (ref 65–99)
GLUCOSE-CAPILLARY: 274 mg/dL — AB (ref 65–99)
GLUCOSE-CAPILLARY: 228 mg/dL — AB (ref 65–99)

## 2015-12-16 MED ORDER — FUROSEMIDE 10 MG/ML IJ SOLN: 40.0000 mg | Freq: Once | INTRAMUSCULAR | Status: DC

## 2015-12-16 MED ORDER — FUROSEMIDE 10 MG/ML IJ SOLN
80.0000 mg | Freq: Two times a day (BID) | INTRAMUSCULAR | Status: DC
  Administered 2015-12-16 – 2015-12-20 (×8): 80 mg via INTRAVENOUS
  Filled 2015-12-16 (×8): qty 8

## 2015-12-16 MED ORDER — ALLOPURINOL 100 MG PO TABS
100.0000 mg | ORAL_TABLET | Freq: Every day | ORAL | Status: DC
  Administered 2015-12-16 – 2015-12-21 (×6): 100 mg via ORAL
  Filled 2015-12-16 (×6): qty 1

## 2015-12-16 NOTE — Progress Notes (Signed)
Jeremiah Perkins GYF:[id number] DOB: [birth year] DOA: 12/13/2015 PCP: Kirt Boys, DO  Brief narrative:  50 ? 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  htn  Erectile dysfucntion Gout  Admitted to [facility] hospital c Acute respiratory failure-PNA vs AECHF States has had ill contacts and contact with people who've had coughs and colds Feverish recently Also states over the past couple weeks he's had further increased and lower extremity swelling and increase in weight  Usually does not have lower extremity swelling   Past medical history-As per Problem list Chart reviewed as below- Reviewed  Consultants:  Cardiology  Procedures:  None  Antibiotics:   Ceftriaxone 1/19  Azithromycin 1/19    Subjective   Doing fair Not SOB but swollen still Not passing much urine as usually does No cp     Objective    Interim History: None  Telemetry: Sinus   Objective: Filed Vitals:   12/15/15 2209 12/16/15 0630 12/16/15 0920 12/16/15 1100  BP: 124/93 113/81  108/69  Pulse: 99 69  85  Temp: 97.6 F (36.4 C) 97.7 F (36.5 C)    TempSrc: Oral Oral    Resp: 18 18    Height:      Weight:  128.6 kg (283 lb 8.2 oz)    SpO2: 98% 98% 98%     Intake/Output Summary (Last 24 hours) at 12/16/15 1123 Last data filed at 12/15/15 1800  Gross per 24 hour  Intake      0 ml  Output    625 ml  Net   -625 ml    Exam:  General: Alert obese pleasant oriented no apparent distress no JVD no bruit Cardiovascular: S1-S2 no murmur rub or gallop Respiratory: Clinically clear no added sound Abdomen: Obese nontender nondistended no rebound Skin grade 1-2 lower extremity edema nonpitting  Neuro intact moving all 4 limbs equally  Data Reviewed: Basic Metabolic Panel:  Recent Labs Lab  12/13/15 1237 12/13/15 1627 12/14/15 1056 12/15/15 0545 12/16/15 0725  NA 136 137 137 136 135  K 3.5 3.6 3.4* 4.0 3.7  CL 101 104 103 104 101  CO2 24 22 23 23 22   GLUCOSE 107* 106* 146* 198* 239*  BUN 13 14 14  24* 29*  CREATININE 0.95 0.94 1.02 1.20 1.05  CALCIUM 8.7* 8.7* 8.3* 8.6* 8.7*   Liver Function Tests:  Recent Labs Lab 12/13/15 1237 12/13/15 1627 12/14/15 1056  AST 49* 47* 40  ALT 20 21 19   ALKPHOS 60 58 56  BILITOT 1.2 1.2 1.0  PROT 6.6 6.4* 6.4*  ALBUMIN 2.9* 2.9* 2.8*   No results for input(s): LIPASE, AMYLASE in the last 168 hours. No results for input(s): AMMONIA in the last 168 hours. CBC:  Recent Labs Lab 12/13/15 1237 12/13/15 1627 12/14/15 1056  WBC 4.3 4.1 3.8*  NEUTROABS  --  2.9 2.4  HGB 12.4* 12.7* 12.2*  HCT 39.2 39.9 37.2*  MCV 92.2 91.3 91.2  PLT 217 201 183   Cardiac Enzymes:  Recent Labs Lab 12/13/15 1627 12/14/15 0401 12/14/15 1056 12/14/15 1448  TROPONINI 0.15* 0.16* 0.12* 0.11*   BNP: Invalid input(s): POCBNP CBG:  Recent Labs Lab 12/15/15 0805 12/15/15 1213 12/15/15 1639 12/15/15 2207 12/16/15 0849  GLUCAP 176* 196* 204* 222* 185*    No results found for this or any previous visit (from the  past 240 hour(s)).   Studies:              All Imaging reviewed and is as per above notation   Scheduled Meds: . carvedilol  12.5 mg Oral BID WC  . colchicine  0.6 mg Oral Daily  . furosemide  40 mg Intravenous BID  . gabapentin  600 mg Oral BID  . insulin aspart  0-15 Units Subcutaneous TID WC  . insulin aspart  0-5 Units Subcutaneous QHS  . ipratropium  0.5 mg Nebulization BID  . levofloxacin  500 mg Oral Daily  . losartan  12.5 mg Oral Daily  . pantoprazole  40 mg Oral Daily  . predniSONE  40 mg Oral Q breakfast  . rivaroxaban  20 mg Oral Q supper   Continuous Infusions:    Assessment/Plan:  1. Acute respiratory failure secondary to Multifactorial dyspnea-CT performed cannot confirm either pneumonia nor  heart failure and as he had a fever and had sputum would be reasonable to treat for pneumonia in addition to heart failure 2. Community-acquired pneumonia-transitioned ceftriaxone and azithromycin to by mouth Levaquin, complete course of antibiotics 1/23 3. Acute exacerbation of nonischemic cardiomyopathy and chronic systolic heart failure-EF this admission 35-40%. Continue Lasix 40 IV twice a day--> 80 mg IV bid.  If no improvement add Zaroxyln in am. Ins and outs are not accurate and he +0.8 liters?Marland Kitchen His weight is unchanged. I have encouraged fluid restriction 1500 cc, continue losartan 12.5 daily, Coreg 12.5 twice a day.  He will need further diuresis. 4. Paroxysmal atrial flutter status post DCC V 2014-continue Coreg. Discontinue telemetry. Continue Xarelto 20 qd 5. Obstructive sleep apnea-continue BiPAP 6. Obesity-BMI is 44-patient should be encouraged regarding weight loss and may benefit from gastric outlet procedure 7. 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 At home is on metformin 1 g twice a day which we will hold for now 8. Erectile dysfunction-hold by gravida 100 mg when necessary for now 9. Gout-continue colchicine 0.6 daily  D/w wife at bedside full code Inpatient   [name], MD  [facility] Pager [phone] 12/16/2015, 11:23 AM    LOS: 2 days

## 2015-12-16 NOTE — Progress Notes (Signed)
SUBJECTIVE: The patient is doing well today.  At this time, he denies chest pain or any new concerns.  Continues to be short of breath.  . allopurinol  100 mg Oral Daily  . carvedilol  12.5 mg Oral BID WC  . colchicine  0.6 mg Oral Daily  . furosemide  80 mg Intravenous BID  . gabapentin  600 mg Oral BID  . insulin aspart  0-15 Units Subcutaneous TID WC  . insulin aspart  0-5 Units Subcutaneous QHS  . ipratropium  0.5 mg Nebulization BID  . levofloxacin  500 mg Oral Daily  . losartan  12.5 mg Oral Daily  . pantoprazole  40 mg Oral Daily  . predniSONE  40 mg Oral Q breakfast  . rivaroxaban  20 mg Oral Q supper      OBJECTIVE: Physical Exam: Filed Vitals:   12/16/15 0630 12/16/15 0920 12/16/15 1100 12/16/15 1401  BP: 113/81  108/69 149/104  Pulse: 69  85 82  Temp: 97.7 F (36.5 C)   97.7 F (36.5 C)  TempSrc: Oral   Oral  Resp: 18   20  Height:      Weight: 283 lb 8.2 oz (128.6 kg)     SpO2: 98% 98%  90%    Intake/Output Summary (Last 24 hours) at 12/16/15 1425 Last data filed at 12/16/15 1100  Gross per 24 hour  Intake      0 ml  Output   1225 ml  Net  -1225 ml    Telemetry reveals sinus rhythm  GEN- The patient is well appearing, alert and oriented x 3 today.   Head- normocephalic, atraumatic Eyes-  Sclera clear, conjunctiva pink Ears- hearing intact Oropharynx- clear Neck- supple, no JVP Lymph- no cervical lymphadenopathy Lungs- mild crackles, normal work of breathing Heart- irreglar rhythm, no murmurs, rubs or gallops, PMI not laterally displaced GI- soft, NT, ND, + BS Extremities- no clubbing, cyanosis, or edema Skin- no rash or lesion Psych- euthymic mood, full affect Neuro- strength and sensation are intact  LABS: Basic Metabolic Panel:  Recent Labs  95/32/02 0725 12/16/15 1253  NA 135 137  K 3.7 4.1  CL 101 103  CO2 22 25  GLUCOSE 239* 226*  BUN 29* 26*  CREATININE 1.05 1.00  CALCIUM 8.7* 8.9   Liver Function Tests:  Recent Labs  12/13/15 1627 12/14/15 1056  AST 47* 40  ALT 21 19  ALKPHOS 58 56  BILITOT 1.2 1.0  PROT 6.4* 6.4*  ALBUMIN 2.9* 2.8*   No results for input(s): LIPASE, AMYLASE in the last 72 hours. CBC:  Recent Labs  12/13/15 1627 12/14/15 1056  WBC 4.1 3.8*  NEUTROABS 2.9 2.4  HGB 12.7* 12.2*  HCT 39.9 37.2*  MCV 91.3 91.2  PLT 201 183   Cardiac Enzymes:  Recent Labs  12/14/15 0401 12/14/15 1056 12/14/15 1448  TROPONINI 0.16* 0.12* 0.11*   BNP: Invalid input(s): POCBNP D-Dimer: No results for input(s): DDIMER in the last 72 hours. Hemoglobin A1C:  Recent Labs  12/14/15 0404  HGBA1C 7.5*   Fasting Lipid Panel: No results for input(s): CHOL, HDL, LDLCALC, TRIG, CHOLHDL, LDLDIRECT in the last 72 hours. Thyroid Function Tests: No results for input(s): TSH, T4TOTAL, T3FREE, THYROIDAB in the last 72 hours.  Invalid input(s): FREET3 Anemia Panel: No results for input(s): VITAMINB12, FOLATE, FERRITIN, TIBC, IRON, RETICCTPCT in the last 72 hours.  RADIOLOGY: Dg Chest 2 View  12/13/2015  CLINICAL DATA:  Cough for 5 days. Chest pain. Shortness  of breath. COPD. EXAM: CHEST  2 VIEW COMPARISON:  12/13/2015 at 12:33 p.m. FINDINGS: Stable enlargement of the cardiopericardial silhouette with bilateral interstitial accentuation similar to today's earlier exam. Thoracic spondylosis. IMPRESSION: 1. Bilateral primarily interstitial accentuation similar to prior, favoring interstitial edema or atypical pneumonia. 2. Stable enlargement of the cardiopericardial silhouette. Electronically Signed   By: Gaylyn Rong M.D.   On: 12/13/2015 19:08   Dg Chest 2 View  12/13/2015  CLINICAL DATA:  Productive cough.  Chest pain. EXAM: CHEST  2 VIEW COMPARISON:  07/23/2015 FINDINGS: Moderate enlargement of the cardiopericardial silhouette with bilateral abnormal interstitial accentuation favoring the mid lungs and lung bases. Tortuous thoracic aorta. No pleural effusion. Mild thoracic spondylosis.  IMPRESSION: 1. Bilateral abnormal increased perihilar and basilar interstitial accentuation with underlying chronic moderate enlargement of the cardiopericardial silhouette. Appearance could be due to interstitial edema or atypical pneumonia. Electronically Signed   By: Gaylyn Rong M.D.   On: 12/13/2015 13:02   Ct Angio Chest Pe W/cm &/or Wo Cm  12/13/2015  CLINICAL DATA:  Acute onset of cough and shortness of breath. Extremity swelling. Generalized weakness and insomnia. Initial encounter. EXAM: CT ANGIOGRAPHY CHEST WITH CONTRAST TECHNIQUE: Multidetector CT imaging of the chest was performed using the standard protocol during bolus administration of intravenous contrast. Multiplanar CT image reconstructions and MIPs were obtained to evaluate the vascular anatomy. CONTRAST:  OMNIPAQUE IOHEXOL 350 MG/ML SOLN COMPARISON:  CT of the chest performed 01/03/2015, and chest radiograph performed earlier today at 6:53 p.m. FINDINGS: There is no evidence of significant pulmonary embolus. Vascular congestion is noted, with bilateral central ground-glass airspace opacities. This most likely reflects pulmonary edema, though infection could have a similar appearance. There is no evidence of pleural effusion or pneumothorax. No masses are identified; no abnormal focal contrast enhancement is seen. The heart is enlarged. Enlarged mediastinal nodes are seen, measuring up to 1.9 cm in short axis, in the right paratracheal, periaortic, precarinal and subcarinal regions, and at the azygoesophageal recess. Incidental note is made of a retroesophageal right subclavian artery. The great vessels are grossly unremarkable in appearance. No pericardial effusion is identified. No axillary lymphadenopathy is seen. The visualized portions of the thyroid gland are unremarkable in appearance. There is also a 2.2 cm epicardial fat pad node, located relatively superiorly. The visualized portions of the liver and spleen are  unremarkable. Perinephric stranding is noted bilaterally. The visualized portions of the pancreas and adrenal glands are within normal limits. No acute osseous abnormalities are seen. Review of the MIP images confirms the above findings. IMPRESSION: 1. No evidence of significant pulmonary embolus. 2. Vascular congestion and cardiomegaly, with bilateral central ground-glass airspace opacities. This most likely reflects pulmonary edema, though infection could have a similar appearance. 3. Enlarged mediastinal nodes are more prominent than on the prior study, measuring up to 1.9 cm in short axis. Some of these would be amenable to biopsy, as deemed clinically appropriate. Relatively superior 2.2 cm epicardial fat pad node also noted. Electronically Signed   By: Roanna Raider M.D.   On: 12/13/2015 23:37    ASSESSMENT AND PLAN:  Active Problems:   Diabetes mellitus type 2, controlled, with complications (HCC)   Diabetic peripheral neuropathy (HCC)   OSA (obstructive sleep apnea)   Long term current use of anticoagulant therapy   Asthma exacerbation   Elevated troponin   Acute on chronic systolic heart failure, NYHA class 3 (HCC)   COPD exacerbation (HCC)   CAP (community acquired pneumonia) Atrial fibrillation:  better controlled with increased dose of coreg.  Would titrate up if necessary.  Elevated troponin: likely due to demand ischemia  Nonischemic cardiomyopathy: EF 35-40%.  CXR and CT both show edema.  Agree with increasing lasix to 80 BID for further diuresis.  Juanelle Trueheart Jorja Loa, MD 12/16/2015 2:25 PM

## 2015-12-17 DIAGNOSIS — I248 Other forms of acute ischemic heart disease: Secondary | ICD-10-CM

## 2015-12-17 DIAGNOSIS — Z7901 Long term (current) use of anticoagulants: Secondary | ICD-10-CM

## 2015-12-17 DIAGNOSIS — I255 Ischemic cardiomyopathy: Secondary | ICD-10-CM

## 2015-12-17 DIAGNOSIS — I481 Persistent atrial fibrillation: Secondary | ICD-10-CM

## 2015-12-17 LAB — BASIC METABOLIC PANEL
Anion gap: 12 (ref 5–15)
BUN: 30 mg/dL — AB (ref 6–20)
CALCIUM: 9 mg/dL (ref 8.9–10.3)
CO2: 25 mmol/L (ref 22–32)
CREATININE: 1.11 mg/dL (ref 0.61–1.24)
Chloride: 100 mmol/L — ABNORMAL LOW (ref 101–111)
GFR calc Af Amer: >60 mL/min (ref >60)
GFR calc non Af Amer: >60 mL/min (ref >60)
GLUCOSE: 154 mg/dL — AB (ref 65–99)
Potassium: 4.4 mmol/L (ref 3.5–5.1)
Sodium: 137 mmol/L (ref 135–145)

## 2015-12-17 LAB — CBC WITH DIFFERENTIAL/PLATELET
BASOS PCT: 0 %
Basophils Absolute: 0 10*3/uL (ref 0.0–0.1)
EOS ABS: 0 10*3/uL (ref 0.0–0.7)
EOS PCT: 0 %
HEMATOCRIT: 43.2 % (ref 39.0–52.0)
Hemoglobin: 14.3 g/dL (ref 13.0–17.0)
Lymphocytes Relative: 20 %
Lymphs Abs: 1.5 10*3/uL (ref 0.7–4.0)
MCH: 29.9 pg (ref 26.0–34.0)
MCHC: 33.1 g/dL (ref 30.0–36.0)
MCV: 90.2 fL (ref 78.0–100.0)
MONO ABS: 0.4 10*3/uL (ref 0.1–1.0)
MONOS PCT: 5 %
NEUTROS ABS: 5.8 10*3/uL (ref 1.7–7.7)
Neutrophils Relative %: 75 %
Platelets: 283 10*3/uL (ref 150–400)
RBC: 4.79 MIL/uL (ref 4.22–5.81)
RDW: 14.3 % (ref 11.5–15.5)
WBC: 7.8 10*3/uL (ref 4.0–10.5)

## 2015-12-17 LAB — GLUCOSE, CAPILLARY
GLUCOSE-CAPILLARY: 141 mg/dL — AB (ref 65–99)
GLUCOSE-CAPILLARY: 230 mg/dL — AB (ref 65–99)
Glucose-Capillary: 255 mg/dL — ABNORMAL HIGH (ref 65–99)
Glucose-Capillary: 196 mg/dL — ABNORMAL HIGH (ref 65–99)

## 2015-12-17 LAB — LEGIONELLA ANTIGEN, URINE

## 2015-12-17 MED ORDER — ZOLPIDEM TARTRATE 5 MG PO TABS
5.0000 mg | ORAL_TABLET | Freq: Every day | ORAL | Status: DC
  Administered 2015-12-17 – 2015-12-20 (×4): 5 mg via ORAL
  Filled 2015-12-17 (×4): qty 1

## 2015-12-17 NOTE — Progress Notes (Signed)
Jeremiah Perkins ZOX:[id number] DOB: 08-05-1961 DOA: 12/13/2015 PCP: Kirt Boys, DO  Brief narrative:  47 ? 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  htn  Erectile dysfucntion Gout  Admitted to [facility] c Acute respiratory failure-PNA vs AECHF States has had ill contacts and contact with people who've had coughs and colds Feverish recently Also states over the past couple weeks he's had further increased and lower extremity swelling and increase in weight  Usually does not have lower extremity swelling   Past medical history-As per Problem list Chart reviewed as below- Reviewed  Consultants:  Cardiology  Procedures:  None  Antibiotics:   Ceftriaxone 1/19-1/20  Azithromycin 1/19-1/20  Levaquin 1/21-1/23    Subjective   fair tol diet Seems to understand fluid restriction No cp no sob No blurred vision   Objective    Interim History: None  Telemetry: Sinus   Objective: Filed Vitals:   12/17/15 0736 12/17/15 0920 12/17/15 1052 12/17/15 1302  BP:  151/93  122/70  Pulse:  65  109  Temp:    97.4 F (36.3 C)  TempSrc:    Oral  Resp:    20  Height:      Weight:   128.685 kg (283 lb 11.2 oz)   SpO2: 93% 99%  96%    Intake/Output Summary (Last 24 hours) at 12/17/15 1444 Last data filed at 12/17/15 1000  Gross per 24 hour  Intake   1300 ml  Output   1400 ml  Net   -100 ml    Exam:  General: Alert obese pleasant oriented no apparent distress no JVD no bruit Cardiovascular: S1-S2 no murmur rub or gallop Respiratory: Clinically clear no added sound Abdomen: Obese nontender nondistended no rebound Skin grade 1-2 lower extremity edema Neuro intact moving all 4 limbs equally  Data Reviewed: Basic Metabolic Panel:  Recent Labs Lab 12/14/15 1056  12/15/15 0545 12/16/15 0725 12/16/15 1253 12/17/15 0530  NA 137 136 135 137 137  K 3.4* 4.0 3.7 4.1 4.4  CL 103 104 101 103 100*  CO2 GLUCOSE 146* 198* 239* 226* 154*  BUN 14 24* 29* 26* 30*  CREATININE 1.02 1.20 1.05 1.00 1.11  CALCIUM 8.3* 8.6* 8.7* 8.9 9.0   Liver Function Tests:  Recent Labs Lab 12/13/15 1237 12/13/15 1627 12/14/15 1056  AST 49* 47* 40  ALT ALKPHOS 60 58 56  BILITOT 1.2 1.2 1.0  PROT 6.6 6.4* 6.4*  ALBUMIN 2.9* 2.9* 2.8*   No results for input(s): LIPASE, AMYLASE in the last 168 hours. No results for input(s): AMMONIA in the last 168 hours. CBC:  Recent Labs Lab 12/13/15 1237 12/13/15 1627 12/14/15 1056 12/17/15 0530  WBC 4.3 4.1 3.8* 7.8  NEUTROABS  --  2.9 2.4 5.8  HGB 12.4* 12.7* 12.2* 14.3  HCT 39.2 39.9 37.2* 43.2  MCV 92.2 91.3 91.2 90.2  PLT 217 201 183 283   Cardiac Enzymes:  Recent Labs Lab 12/13/15 1627 12/14/15 0401 12/14/15 1056 12/14/15 1448  TROPONINI 0.15* 0.16* 0.12* 0.11*   BNP: Invalid input(s): POCBNP CBG:  Recent Labs Lab 12/16/15 1150 0Xavi Tomasik/17 1638 12/16/15 2224 12/17/15 0817 12/17/15 1201  GLUCAP 237* 274* 228* 141* 255*    No results found for this or any previous visit (from the past  240 hour(s)).   Studies:              All Imaging reviewed and is as per above notation   Scheduled Meds: . allopurinol  100 mg Oral Daily  . carvedilol  12.5 mg Oral BID WC  . colchicine  0.6 mg Oral Daily  . furosemide  80 mg Intravenous BID  . gabapentin  600 mg Oral BID  . insulin aspart  0-15 Units Subcutaneous TID WC  . insulin aspart  0-5 Units Subcutaneous QHS  . ipratropium  0.5 mg Nebulization BID  . levofloxacin  500 mg Oral Daily  . losartan  12.5 mg Oral Daily  . pantoprazole  40 mg Oral Daily  . predniSONE  40 mg Oral Q breakfast  . rivaroxaban  20 mg Oral Q supper  . zolpidem  5 mg Oral QHS   Continuous Infusions:    Assessment/Plan:  1. Acute respiratory  failure secondary to Multifactorial dyspnea-CT equivocal pneumonia nor heart failure. 2. Community-acquired pneumonia-transitioned ceftriaxone and azithromycin to by mouth Levaquin, complete course of antibiotics 1/23. 3. Acute exacerbation of nonischemic cardiomyopathy and chronic systolic heart failure-EF this admission 35-40%. Continue Lasix 40 IV twice a day--> 80 mg IV bid 12/15/14.  Await improvement, ? add Zaroxyln in am. Ins and outs are not accurate.  His weight is unchanged. I have encouraged fluid restriction 1500 cc, continue losartan 12.5 daily, Coreg 12.5 twice a day.   4. Paroxysmal atrial flutter status post DCC V 2014-continue Coreg. Discontinue telemetry. Continue Xarelto 20 qd 5. Obstructive sleep apnea-continue BiPAP 6. Obesity-BMI is 44-patient should be encouraged regarding weight loss and may benefit from gastric outlet procedure 7. Diabetes mellitus type 2 with complications neuropathy-blood sugars ranging 141-255, continue sliding scale coverage 3 times a day insulin.  At home is on metformin 1 g twice a day which we will hold for now 8. Erectile dysfunction-hold by gravida 100 mg when necessary for now 9. Gout-continue colchicine 0.6 daily  D/w wife at bedside full code Inpatient pending resolution of Edema   [name], MD  [facility] Pager [phone] 12/17/2015, 2:44 PM    LOS: 3 days

## 2015-12-17 NOTE — Progress Notes (Signed)
Placed patient on CPAP for the night.  Patient is tolerating well at this time. 

## 2015-12-17 NOTE — Progress Notes (Signed)
Patient asking for a nicotine patch. Pt smokes 1pk a week. MD paged.

## 2015-12-17 NOTE — Care Management Important Message (Signed)
Important Message  Patient Details  Name: Jeremiah Perkins MRN: [id number] Date of Birth: 12/17/1960   Medicare Important Message Given:  Yes    Kyla Balzarine 12/17/2015, 4:02 PM

## 2015-12-17 NOTE — Progress Notes (Signed)
Patient Name: Jeremiah Perkins Date of Encounter: 12/17/2015  Active Problems:   Diabetes mellitus type 2, controlled, with complications (HCC)   Diabetic peripheral neuropathy (HCC)   OSA (obstructive sleep apnea)   Long term current use of anticoagulant therapy   Asthma exacerbation   Elevated troponin   Acute on chronic systolic heart failure, NYHA class 3 (HCC)   COPD exacerbation (HCC)   CAP (community acquired pneumonia)   Length of Stay: 3  SUBJECTIVE  He complain of mild SOB while in bed and while sitting, walked down the hallway without significant DOE.   CURRENT MEDS . allopurinol  100 mg Oral Daily  . carvedilol  12.5 mg Oral BID WC  . colchicine  0.6 mg Oral Daily  . furosemide  80 mg Intravenous BID  . gabapentin  600 mg Oral BID  . insulin aspart  0-15 Units Subcutaneous TID WC  . insulin aspart  0-5 Units Subcutaneous QHS  . ipratropium  0.5 mg Nebulization BID  . levofloxacin  500 mg Oral Daily  . losartan  12.5 mg Oral Daily  . pantoprazole  40 mg Oral Daily  . predniSONE  40 mg Oral Q breakfast  . rivaroxaban  20 mg Oral Q supper    OBJECTIVE  Filed Vitals:   12/16/15 2143 12/17/15 0545 12/17/15 0736 12/17/15 0920  BP: 100/59 122/82  151/93  Pulse: 98 84  65  Temp: 98 F (36.7 C) 97.4 F (36.3 C)    TempSrc: Oral Oral    Resp: 18 20    Height:      Weight:  283 lb 8.9 oz (128.62 kg)    SpO2: 97% 98% 93% 99%    Intake/Output Summary (Last 24 hours) at 12/17/15 1048 Last data filed at 12/17/15 0919  Gross per 24 hour  Intake   1300 ml  Output   1500 ml  Net   -200 ml   Filed Weights   12/15/15 0404 12/16/15 0630 12/17/15 0545  Weight: 283 lb 9.6 oz (128.64 kg) 283 lb 8.2 oz (128.6 kg) 283 lb 8.9 oz (128.62 kg)    PHYSICAL EXAM  General: Pleasant, NAD. Morbidly obese. Neuro: Alert and oriented X 3. Moves all extremities spontaneously. Psych: Normal affect. HEENT:  Normal  Neck: Supple without bruits or JVD. Lungs:  Resp  regular and unlabored, CTA. Heart: RRR no s3, s4, or murmurs. Abdomen: Soft, non-tender, non-distended, BS + x 4.  Extremities: No clubbing, cyanosis, B/L 1+ LE edema. DP/PT/Radials 2+ and equal bilaterally.  Accessory Clinical Findings  CBC  Recent Labs  12/14/15 1056 12/17/15 0530  WBC 3.8* 7.8  NEUTROABS 2.4 5.8  HGB 12.2* 14.3  HCT 37.2* 43.2  MCV 91.2 90.2  PLT 183 283   Basic Metabolic Panel  Recent Labs  12/16/15 1253 12/17/15 0530  NA 137 137  K 4.1 4.4  CL 103 100*  CO2 25 25  GLUCOSE 226* 154*  BUN 26* 30*  CREATININE 1.00 1.11  CALCIUM 8.9 9.0   Liver Function Tests  Recent Labs  12/14/15 1056  AST 40  ALT 19  ALKPHOS 56  BILITOT 1.0  PROT 6.4*  ALBUMIN 2.8*    Recent Labs  12/14/15 1056 12/14/15 1448  TROPONINI 0.12* 0.11*   Radiology/Studies  Dg Chest 2 View  12/13/2015  CLINICAL DATA:  Cough for 5 days. Chest pain. Shortness of breath. COPD. EXAM: CHEST  2 VIEW COMPARISON:  12/13/2015 at 12:33 p.m. FINDINGS: Stable enlargement of  the cardiopericardial silhouette with bilateral interstitial accentuation similar to today's earlier exam. Thoracic spondylosis. IMPRESSION: 1. Bilateral primarily interstitial accentuation similar to prior, favoring interstitial edema or atypical pneumonia. 2. Stable enlargement of the cardiopericardial silhouette.   Ct Angio Chest Pe W/cm &/or Wo Cm  12/13/2015  CLINICAL DATA:  Acute onset of cough and shortness of breath. Extremity swelling. Generalized weakness and insomnia. Initial encounter.  IMPRESSION: 1. No evidence of significant pulmonary embolus. 2. Vascular congestion and cardiomegaly, with bilateral central ground-glass airspace opacities. This most likely reflects pulmonary edema, though infection could have a similar appearance. 3. Enlarged mediastinal nodes are more prominent than on the prior study, measuring up to 1.9 cm in short axis. Some of these would be amenable to biopsy, as deemed clinically  appropriate. Relatively superior 2.2 cm epicardial fat pad node also noted. Electronically Signed     TELE: a-fib, rate controlled  Echo: 12/14/15  Left ventricle: The cavity size was normal. Systolic function was moderately reduced. The estimated ejection fraction was in the range of 35% to 40%. Akinesis of the apicalanteroseptal and apical myocardium.  Impressions:  - Very technically difficult echo due to body habitus. We were not able to visualize all cardiac structures.   ASSESSMENT     Acute on chronic systolic heart failure, NYHA class 3 (HCC)   Elevated troponin    Ischemic cardiomyopathy, LVEF 35-40%   Chronic atrial fibrillation  Diabetes mellitus type 2, controlled, with complications (HCC)  Diabetic peripheral neuropathy (HCC)  OSA (obstructive sleep apnea)  Long term current use of anticoagulant therapy  Asthma exacerbation  Elevated troponin  COPD exacerbation (HCC)  CAP (community acquired pneumonia)  PLAN:  Continue increased dose of lasix, would consider adding metolazone tomorrow if no good diuresis overnight.  His elevated troponin is most probably likely due to demand ischemia, he denies chest pain. No cath at this time.  He is encouraged to walk.   Signed, Lars Masson MD, [facility] 12/17/2015

## 2015-12-18 DIAGNOSIS — R7989 Other specified abnormal findings of blood chemistry: Secondary | ICD-10-CM

## 2015-12-18 DIAGNOSIS — J45901 Unspecified asthma with (acute) exacerbation: Secondary | ICD-10-CM

## 2015-12-18 DIAGNOSIS — G4733 Obstructive sleep apnea (adult) (pediatric): Secondary | ICD-10-CM

## 2015-12-18 LAB — BASIC METABOLIC PANEL
ANION GAP: 10 (ref 5–15)
BUN: 29 mg/dL — AB (ref 6–20)
CALCIUM: 8.7 mg/dL — AB (ref 8.9–10.3)
CO2: 25 mmol/L (ref 22–32)
Chloride: 102 mmol/L (ref 101–111)
Creatinine, Ser: 1.07 mg/dL (ref 0.61–1.24)
GFR calc Af Amer: >60 mL/min (ref >60)
GLUCOSE: 234 mg/dL — AB (ref 65–99)
Potassium: 3.9 mmol/L (ref 3.5–5.1)
Sodium: 137 mmol/L (ref 135–145)

## 2015-12-18 LAB — CBC
HCT: 41.6 % (ref 39.0–52.0)
HEMOGLOBIN: 13.5 g/dL (ref 13.0–17.0)
MCH: 29.3 pg (ref 26.0–34.0)
MCHC: 32.5 g/dL (ref 30.0–36.0)
MCV: 90.4 fL (ref 78.0–100.0)
PLATELETS: 293 10*3/uL (ref 150–400)
RBC: 4.6 MIL/uL (ref 4.22–5.81)
RDW: 14.4 % (ref 11.5–15.5)
WBC: 8.3 10*3/uL (ref 4.0–10.5)

## 2015-12-18 LAB — GLUCOSE, CAPILLARY
GLUCOSE-CAPILLARY: 217 mg/dL — AB (ref 65–99)
GLUCOSE-CAPILLARY: 374 mg/dL — AB (ref 65–99)
Glucose-Capillary: 172 mg/dL — ABNORMAL HIGH (ref 65–99)
Glucose-Capillary: 283 mg/dL — ABNORMAL HIGH (ref 65–99)

## 2015-12-18 MED ORDER — GUAIFENESIN ER 600 MG PO TB12
600.0000 mg | ORAL_TABLET | Freq: Two times a day (BID) | ORAL | Status: DC
  Administered 2015-12-18 – 2015-12-21 (×7): 600 mg via ORAL
  Filled 2015-12-18 (×7): qty 1

## 2015-12-18 MED ORDER — NICOTINE 7 MG/24HR TD PT24
7.0000 mg | MEDICATED_PATCH | Freq: Every day | TRANSDERMAL | Status: DC
  Administered 2015-12-18 – 2015-12-21 (×4): 7 mg via TRANSDERMAL
  Filled 2015-12-18 (×5): qty 1

## 2015-12-18 MED ORDER — TIOTROPIUM BROMIDE MONOHYDRATE 18 MCG IN CAPS: 18.0000 ug | ORAL_CAPSULE | Freq: Every day | RESPIRATORY_TRACT | Status: DC

## 2015-12-18 MED ORDER — BUDESONIDE 0.5 MG/2ML IN SUSP
1.0000 mg | Freq: Two times a day (BID) | RESPIRATORY_TRACT | Status: DC
  Administered 2015-12-18 – 2015-12-20 (×5): 1 mg via RESPIRATORY_TRACT
  Administered 2015-12-21: 0.5 mg via RESPIRATORY_TRACT
  Filled 2015-12-18 (×8): qty 4

## 2015-12-18 NOTE — Progress Notes (Signed)
Patient Name: Jeremiah Perkins Date of Encounter: 12/18/2015  Active Problems:   Diabetes mellitus type 2, controlled, with complications (HCC)   Diabetic peripheral neuropathy (HCC)   OSA (obstructive sleep apnea)   Long term current use of anticoagulant therapy   Asthma exacerbation   Elevated troponin   Acute on chronic systolic heart failure, NYHA class 3 (HCC)   COPD exacerbation (HCC)   CAP (community acquired pneumonia)   Length of Stay: 4  SUBJECTIVE  He complain of mild SOB while in bed and while sitting, walked down the hallway without significant DOE.   CURRENT MEDS . allopurinol  100 mg Oral Daily  . carvedilol  12.5 mg Oral BID WC  . colchicine  0.6 mg Oral Daily  . furosemide  80 mg Intravenous BID  . gabapentin  600 mg Oral BID  . insulin aspart  0-15 Units Subcutaneous TID WC  . insulin aspart  0-5 Units Subcutaneous QHS  . ipratropium  0.5 mg Nebulization BID  . losartan  12.5 mg Oral Daily  . nicotine  7 mg Transdermal Daily  . pantoprazole  40 mg Oral Daily  . predniSONE  40 mg Oral Q breakfast  . rivaroxaban  20 mg Oral Q supper  . zolpidem  5 mg Oral QHS    OBJECTIVE  Filed Vitals:   12/18/15 0543 12/18/15 0545 12/18/15 0600 12/18/15 0825  BP: 127/104 130/79  106/91  Pulse: 89 73  84  Temp: 97.6 F (36.4 C)     TempSrc: Oral     Resp: 18     Height:      Weight:   274 lb 8 oz (124.512 kg)   SpO2: 92%   100%    Intake/Output Summary (Last 24 hours) at 12/18/15 1015 Last data filed at 12/18/15 0833  Gross per 24 hour  Intake    660 ml  Output   2500 ml  Net  -1840 ml   Filed Weights   12/17/15 0545 12/17/15 1052 12/18/15 0600  Weight: 283 lb 8.9 oz (128.62 kg) 283 lb 11.2 oz (128.685 kg) 274 lb 8 oz (124.512 kg)    PHYSICAL EXAM  General: Pleasant, NAD. Morbidly obese. Neuro: Alert and oriented X 3. Moves all extremities spontaneously. Psych: Normal affect. HEENT:  Normal  Neck: Supple without bruits or JVD. Lungs:  Resp  regular and unlabored, wheezing B/L. Heart: RRR no s3, s4, or murmurs. Abdomen: Soft, non-tender, non-distended, BS + x 4.  Extremities: No clubbing, cyanosis, B/L 1+ LE edema. DP/PT/Radials 2+ and equal bilaterally.  Accessory Clinical Findings  CBC  Recent Labs  12/17/15 0530 12/18/15 0601  WBC 7.8 8.3  NEUTROABS 5.8  --   HGB 14.3 13.5  HCT 43.2 41.6  MCV 90.2 90.4  PLT 283 293   Basic Metabolic Panel  Recent Labs  12/17/15 0530 12/18/15 0601  NA 137 137  K 4.4 3.9  CL 100* 102  CO2 25 25  GLUCOSE 154* 234*  BUN 30* 29*  CREATININE 1.11 1.07  CALCIUM 9.0 8.7*   Liver Function Tests No results for input(s): AST, ALT, ALKPHOS, BILITOT, PROT, ALBUMIN in the last 72 hours. No results for input(s): CKTOTAL, CKMB, CKMBINDEX, TROPONINI in the last 72 hours. Radiology/Studies  Dg Chest 2 View  12/13/2015  CLINICAL DATA:  Cough for 5 days. Chest pain. Shortness of breath. COPD. EXAM: CHEST  2 VIEW COMPARISON:  12/13/2015 at 12:33 p.m. FINDINGS: Stable enlargement of the cardiopericardial silhouette with  bilateral interstitial accentuation similar to today's earlier exam. Thoracic spondylosis. IMPRESSION: 1. Bilateral primarily interstitial accentuation similar to prior, favoring interstitial edema or atypical pneumonia. 2. Stable enlargement of the cardiopericardial silhouette.   Ct Angio Chest Pe W/cm &/or Wo Cm  12/13/2015  CLINICAL DATA:  Acute onset of cough and shortness of breath. Extremity swelling. Generalized weakness and insomnia. Initial encounter.  IMPRESSION: 1. No evidence of significant pulmonary embolus. 2. Vascular congestion and cardiomegaly, with bilateral central ground-glass airspace opacities. This most likely reflects pulmonary edema, though infection could have a similar appearance. 3. Enlarged mediastinal nodes are more prominent than on the prior study, measuring up to 1.9 cm in short axis. Some of these would be amenable to biopsy, as deemed clinically  appropriate. Relatively superior 2.2 cm epicardial fat pad node also noted. Electronically Signed     TELE: a-fib, rate controlled  Echo: 12/14/15  Left ventricle: The cavity size was normal. Systolic function was moderately reduced. The estimated ejection fraction was in the range of 35% to 40%. Akinesis of the apicalanteroseptal and apical myocardium.  Impressions:  - Very technically difficult echo due to body habitus. We were not able to visualize all cardiac structures.   ASSESSMENT     Acute on chronic systolic heart failure, NYHA class 3 (HCC)   Elevated troponin    Ischemic cardiomyopathy, LVEF 35-40%   Chronic atrial fibrillation  Diabetes mellitus type 2, controlled, with complications (HCC)  Diabetic peripheral neuropathy (HCC)  OSA (obstructive sleep apnea)  Long term current use of anticoagulant therapy  Asthma exacerbation  Elevated troponin  COPD exacerbation (HCC)  CAP (community acquired pneumonia)  PLAN:  Continue increased dose of lasix, now improved diuresis, but persistent LE edema. There is significant wheezing that is being managed by the hospitalist.  His elevated troponin is most probably likely due to demand ischemia, he denies chest pain. No cath at this time.  He is encouraged to walk.   Signed, Lars Masson MD, [facility] 12/18/2015

## 2015-12-18 NOTE — Consult Note (Signed)
   [facility] Inpatient Consult   12/18/2015  Jeremiah Perkins 1961/10/30 [id number] Patient was screened for care Perkins services. Explained that Jeremiah Perkins is a covered benefit of [employer] Medicare insurance.   Met with the patient regarding the benefits of [place] [facility] services. Review information for [facility] Care [facility] and a folder was provided with contact information.  Explained that Jeremiah Perkins does not interfere with or replace any services arranged by the inpatient care Perkins staff.  Patient declined services with Jeremiah Perkins stating, I've been doing pretty good.  I might need a nebulizer machine and that's all I think right now for my COPD."  Patient did accept a brochure and pamphlet, 'Know Before you Go" along with contact information.  For questions, please contact: Jeremiah Brood, RN BSN [facility] Liaison  [phone] business mobile phone Toll free office [phone]

## 2015-12-18 NOTE — Progress Notes (Signed)
Jeremiah Perkins ZOX:[id number] DOB: November 01, 1961 DOA: 12/13/2015 PCP: Kirt Boys, DO  Brief narrative:  56 ? 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  htn  Erectile dysfucntion Gout  Admitted to [facility] hospital c Acute respiratory failure-PNA vs AECHF States has had ill contacts and contact with people who've had coughs and colds Feverish recently Also states over the past couple weeks he's had further increased and lower extremity swelling and increase in weight  Usually does not have lower extremity swelling   Past medical history-As per Problem list Chart reviewed as below- Reviewed  Consultants:  Cardiology  Procedures:  None  Antibiotics:   Ceftriaxone 1/19-1/20  Azithromycin 1/19-1/20  Levaquin 1/21-1/23    Subjective   Overall fair Wheezy with walking Debilitated Swelling down a little   Objective    Interim History: None  Telemetry: Sinus   Objective: Filed Vitals:   12/18/15 0545 12/18/15 0600 12/18/15 0825 12/18/15 1024  BP: 130/79  106/91 110/77  Pulse: 73  84 91  Temp:      TempSrc:      Resp:      Height:      Weight:  124.512 kg (274 lb 8 oz)    SpO2:   100%     Intake/Output Summary (Last 24 hours) at 12/18/15 1226 Last data filed at 12/18/15 1029  Gross per 24 hour  Intake    660 ml  Output   3100 ml  Net  -2440 ml    Exam:  General: Alert obese pleasant oriented no apparent distress no JVD no bruit Cardiovascular: S1-S2 no murmur rub or gallop Respiratory: Clinically clear no added sound Abdomen: Obese nontender nondistended no rebound Skin grade 1-2 lower extremity edema Neuro intact moving all 4 limbs equally  Data Reviewed: Basic Metabolic Panel:  Recent Labs Lab 12/15/15 0545 12/16/15 0725 12/16/15 1253  12/17/15 0530 12/18/15 0601  NA 136 135 137 137 137  K 4.0 3.7 4.1 4.4 3.9  CL 104 101 103 100* 102  CO2 GLUCOSE 198* 239* 226* 154* 234*  BUN 24* 29* 26* 30* 29*  CREATININE 1.20 1.05 1.00 1.11 1.07  CALCIUM 8.6* 8.7* 8.9 9.0 8.7*   Liver Function Tests:  Recent Labs Lab 12/13/15 1237 12/13/15 1627 12/14/15 1056  AST 49* 47* 40  ALT ALKPHOS 60 58 56  BILITOT 1.2 1.2 1.0  PROT 6.6 6.4* 6.4*  ALBUMIN 2.9* 2.9* 2.8*   No results for input(s): LIPASE, AMYLASE in the last 168 hours. No results for input(s): AMMONIA in the last 168 hours. CBC:  Recent Labs Lab 12/13/15 1237 12/13/15 1627 12/14/15 1056 12/17/15 0530 12/18/15 0601  WBC 4.3 4.1 3.8* 7.8 8.3  NEUTROABS  --  2.9 2.4 5.8  --   HGB 12.4* 12.7* 12.2* 14.3 13.5  HCT 39.2 39.9 37.2* 43.2 41.6  MCV 92.2 91.3 91.2 90.2 90.4  PLT 217 201 183 283 293   Cardiac Enzymes:  Recent Labs Lab 12/13/15 1627 12/14/15 0401 12/14/15 1056 12/14/15 1448  TROPONINI 0.15* 0.16* 0.12* 0.11*   BNP: Invalid input(s): POCBNP CBG:  Recent Labs Lab 12/17/15 1201 12/17/15 1713 12/17/15 2152 12/18/15 0804 12/18/15 1220  GLUCAP 255* 196* 230* 172* 283*    No results found for this or any previous visit (from  the past 240 hour(s)).   Studies:              All Imaging reviewed and is as per above notation   Scheduled Meds: . allopurinol  100 mg Oral Daily  . budesonide  1 mg Nebulization BID  . carvedilol  12.5 mg Oral BID WC  . colchicine  0.6 mg Oral Daily  . furosemide  80 mg Intravenous BID  . gabapentin  600 mg Oral BID  . guaiFENesin  600 mg Oral BID  . insulin aspart  0-15 Units Subcutaneous TID WC  . insulin aspart  0-5 Units Subcutaneous QHS  . ipratropium  0.5 mg Nebulization BID  . losartan  12.5 mg Oral Daily  . nicotine  7 mg Transdermal Daily  . pantoprazole  40 mg Oral Daily  . predniSONE  40 mg Oral Q breakfast  . rivaroxaban  20 mg Oral Q supper  . zolpidem  5 mg  Oral QHS   Continuous Infusions:    Assessment/Plan:  1. Acute respiratory failure secondary to Multifactorial dyspnea-CT equivocal pneumonia nor heart failure. 2. Community-acquired pneumonia-transitioned ceftriaxone and azithromycin to by mouth Levaquin, complete course of antibiotics 1/23. 3. Acute exacerbation of nonischemic cardiomyopathy and chronic systolic heart failure-EF this admission 35-40%. Continue Lasix 40 IV twice a day--> 80 mg IV bid 12/15/14.   Ins and outs -2.3 liters .  Wght 283-->274. cont fluid restriction 1500 cc, continue losartan 12.5 daily, Coreg 12.5 twice a day.   4. Paroxysmal atrial flutter status post DCC V 2014-continue Coreg. Discontinue telemetry. Continue Xarelto 20 qd 5. Obstructive sleep apnea-continue BiPAP 6. Obesity-BMI is 44-patient should be encouraged regarding weight loss and may benefit from gastric outlet procedure 7. 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  At home is on metformin 1 g twice a day which we will hold for now 8. Erectile dysfunction-hold by gravida 100 mg when necessary for now 9. Gout-continue colchicine 0.6 daily  No family bedside full code Inpatient pending resolution of Edema Expect another 2-4 days  [name], MD  [facility] Pager [phone] 12/18/2015, 12:26 PM    LOS: 4 days

## 2015-12-18 NOTE — Progress Notes (Signed)
Patient complaining of cough with intermittent mucous, thick white production. Patient asking for medicine to "help get the mucous out". Patient has been ambulating halls and turn/cough/deep breath to try to get mucous out. Patient has fine crackles in lung bases and received IV lasix this morning. MD paged.

## 2015-12-19 LAB — BASIC METABOLIC PANEL
ANION GAP: 14 (ref 5–15)
BUN: 27 mg/dL — ABNORMAL HIGH (ref 6–20)
CALCIUM: 9.2 mg/dL (ref 8.9–10.3)
CO2: 26 mmol/L (ref 22–32)
Chloride: 97 mmol/L — ABNORMAL LOW (ref 101–111)
Creatinine, Ser: 0.96 mg/dL (ref 0.61–1.24)
GLUCOSE: 149 mg/dL — AB (ref 65–99)
Potassium: 3.8 mmol/L (ref 3.5–5.1)
SODIUM: 137 mmol/L (ref 135–145)

## 2015-12-19 LAB — EXPECTORATED SPUTUM ASSESSMENT W REFEX TO RESP CULTURE

## 2015-12-19 LAB — GLUCOSE, CAPILLARY
GLUCOSE-CAPILLARY: 289 mg/dL — AB (ref 65–99)
Glucose-Capillary: 188 mg/dL — ABNORMAL HIGH (ref 65–99)
Glucose-Capillary: 325 mg/dL — ABNORMAL HIGH (ref 65–99)
Glucose-Capillary: 249 mg/dL — ABNORMAL HIGH (ref 65–99)

## 2015-12-19 LAB — EXPECTORATED SPUTUM ASSESSMENT W GRAM STAIN, RFLX TO RESP C

## 2015-12-19 MED ORDER — PREDNISONE 20 MG PO TABS
20.0000 mg | ORAL_TABLET | Freq: Every day | ORAL | Status: DC
  Administered 2015-12-20: 20 mg via ORAL
  Filled 2015-12-19: qty 1

## 2015-12-19 NOTE — Plan of Care (Signed)
Problem: Food- and Nutrition-Related Knowledge Deficit (NB-1.1) Goal: Nutrition education Formal process to instruct or train a patient/client in a skill or to impart knowledge to help patients/clients voluntarily manage or modify food choices and eating behavior to maintain or improve health. Outcome: Adequate for Discharge Nutrition Education Note  RD consulted for nutrition education regarding a Heart Healthy diet.   Lipid Panel     Component Value Date/Time    CHOL 160 05/30/2015 1208    CHOL 186 01/04/2015 0518    TRIG 158* 05/30/2015 1208    HDL 40 05/30/2015 1208    HDL 33* 01/04/2015 0518    CHOLHDL 4.0 05/30/2015 1208    CHOLHDL 5.6 01/04/2015 0518    VLDL 40 01/04/2015 0518    LDLCALC 88 05/30/2015 1208    LDLCALC 113* 01/04/2015 0518   Spoke with pt at bedside, who reports that he has seen an RD in the outpatient setting Jeremiah Perkins, RD, LDN, [facility] Reviewed diet recall; pt generally consumes 1-2 meals per day with a bedtime snack. He reports that he limits the sodium and carbohydrates in his diet by adding sodium-free seasonings to foods, choosing high fiber carbohydrate sources (such as dry beans), and using fresh vegetables in his garden. His primary concern today is how optimize his diet prevent muscle loss with consideration to his DM and heart disease.   RD provided "Low Sodium Nutrition Therapy" handout from the Academy of Nutrition and Dietetics. Reviewed patient's dietary recall. Provided examples on ways to decrease sodium and fat intake in diet. Discouraged intake of processed foods and use of salt shaker. Encouraged fresh fruits and vegetables as well as whole grain sources of carbohydrates to maximize fiber intake.   Discussed different food groups and their effects on blood sugar, emphasizing carbohydrate-containing foods. Provided list of carbohydrates and recommended serving sizes of common foods.  Discussed importance of controlled  and consistent carbohydrate intake throughout the day. Provided examples of ways to balance meals/snacks and encouraged intake of high-fiber, whole grain complex carbohydrates.   Emphasized the importance of serving sizes and provided examples of correct portions of common foods. Discussed importance of controlled and consistent intake throughout the day. Provided examples of ways to balance meals/snacks and encouraged intake of high-fiber, whole grain complex carbohydrates. Emphasized the importance of hydration with calorie-free beverages and limiting sugar-sweetened beverages. Encouraged pt to discuss physical activity options with physician. Teach back method used.  Expect fair to good compliance.  Current diet order is Heart Healthy with 1500 ml fluid restriction, patient is consuming approximately 100% of meals at this time. Labs and medications reviewed. No further nutrition interventions warranted at this time. RD contact information provided. If additional nutrition issues arise, please re-consult RD.  Jeremiah Perkins, RD, LDN, CDE Pager: [phone] After hours Pager: [phone]

## 2015-12-19 NOTE — Progress Notes (Signed)
Patient Name: Jeremiah Perkins Date of Encounter: 12/19/2015  Active Problems:   Diabetes mellitus type 2, controlled, with complications (HCC)   Diabetic peripheral neuropathy (HCC)   OSA (obstructive sleep apnea)   Long term current use of anticoagulant therapy   Asthma exacerbation   Elevated troponin   Acute on chronic systolic heart failure, NYHA class 3 (HCC)   COPD exacerbation (HCC)   CAP (community acquired pneumonia)   Length of Stay: 5  SUBJECTIVE  SOB and LE edema has slightly improved.   CURRENT MEDS . allopurinol  100 mg Oral Daily  . budesonide  1 mg Nebulization BID  . carvedilol  12.5 mg Oral BID WC  . colchicine  0.6 mg Oral Daily  . furosemide  80 mg Intravenous BID  . gabapentin  600 mg Oral BID  . guaiFENesin  600 mg Oral BID  . insulin aspart  0-15 Units Subcutaneous TID WC  . insulin aspart  0-5 Units Subcutaneous QHS  . ipratropium  0.5 mg Nebulization BID  . losartan  12.5 mg Oral Daily  . nicotine  7 mg Transdermal Daily  . pantoprazole  40 mg Oral Daily  . [START ON 12/20/2015] predniSONE  20 mg Oral Q breakfast  . rivaroxaban  20 mg Oral Q supper  . zolpidem  5 mg Oral QHS    OBJECTIVE  Filed Vitals:   12/19/15 0545 12/19/15 0601 12/19/15 0752 12/19/15 1256  BP: 123/98  122/75 111/86  Pulse: 137 74 100 60  Temp: 98.7 F (37.1 C)     TempSrc:      Resp: 20   24  Height:      Weight: 280 lb 6.8 oz (127.2 kg)     SpO2: 100% 100%  100%    Intake/Output Summary (Last 24 hours) at 12/19/15 1433 Last data filed at 12/19/15 1404  Gross per 24 hour  Intake   1042 ml  Output   2450 ml  Net  -1408 ml   Filed Weights   12/17/15 1052 12/18/15 0600 12/19/15 0545  Weight: 283 lb 11.2 oz (128.685 kg) 274 lb 8 oz (124.512 kg) 280 lb 6.8 oz (127.2 kg)    PHYSICAL EXAM  General: Pleasant, NAD. Morbidly obese. Neuro: Alert and oriented X 3. Moves all extremities spontaneously. Psych: Normal affect. HEENT:  Normal  Neck: Supple  without bruits or JVD. Lungs:  Resp regular and unlabored, wheezing B/L. Heart: RRR no s3, s4, or murmurs. Abdomen: Soft, non-tender, non-distended, BS + x 4.  Extremities: No clubbing, cyanosis, B/L 1+ LE edema. DP/PT/Radials 2+ and equal bilaterally.  Accessory Clinical Findings  CBC  Recent Labs  12/17/15 0530 12/18/15 0601  WBC 7.8 8.3  NEUTROABS 5.8  --   HGB 14.3 13.5  HCT 43.2 41.6  MCV 90.2 90.4  PLT 283 293   Basic Metabolic Panel  Recent Labs  12/18/15 0601 12/19/15 0642  NA 137 137  K 3.9 3.8  CL 102 97*  CO2 25 26  GLUCOSE 234* 149*  BUN 29* 27*  CREATININE 1.07 0.96  CALCIUM 8.7* 9.2   Dg Chest 2 View  12/13/2015  CLINICAL DATA:  Cough for 5 days. Chest pain. Shortness of breath. COPD. EXAM: CHEST  2 VIEW COMPARISON:  12/13/2015 at 12:33 p.m. FINDINGS: Stable enlargement of the cardiopericardial silhouette with bilateral interstitial accentuation similar to today's earlier exam. Thoracic spondylosis. IMPRESSION: 1. Bilateral primarily interstitial accentuation similar to prior, favoring interstitial edema or atypical pneumonia. 2.  Stable enlargement of the cardiopericardial silhouette.   Ct Angio Chest Pe W/cm &/or Wo Cm  12/13/2015  CLINICAL DATA:  Acute onset of cough and shortness of breath. Extremity swelling. Generalized weakness and insomnia. Initial encounter.  IMPRESSION: 1. No evidence of significant pulmonary embolus. 2. Vascular congestion and cardiomegaly, with bilateral central ground-glass airspace opacities. This most likely reflects pulmonary edema, though infection could have a similar appearance. 3. Enlarged mediastinal nodes are more prominent than on the prior study, measuring up to 1.9 cm in short axis. Some of these would be amenable to biopsy, as deemed clinically appropriate. Relatively superior 2.2 cm epicardial fat pad node also noted. Electronically Signed     TELE: a-fib, rate controlled  Echo: 12/14/15  Left ventricle: The cavity  size was normal. Systolic function was moderately reduced. The estimated ejection fraction was in the range of 35% to 40%. Akinesis of the apicalanteroseptal and apical myocardium.  Impressions:  - Very technically difficult echo due to body habitus. We were not able to visualize all cardiac structures.   ASSESSMENT     Acute on chronic systolic heart failure, NYHA class 3 (HCC)   Elevated troponin    Ischemic cardiomyopathy, LVEF 35-40%   Chronic atrial fibrillation  Diabetes mellitus type 2, controlled, with complications (HCC)  Diabetic peripheral neuropathy (HCC)  OSA (obstructive sleep apnea)  Long term current use of anticoagulant therapy  Asthma exacerbation  Elevated troponin  COPD exacerbation (HCC)  CAP (community acquired pneumonia)  PLAN:  Continue increased dose of lasix for another day, switch to 80 mg po BID tomorrow,improved SOB and LE edema. His elevated troponin is most probably likely due to demand ischemia, he denies chest pain. No cath at this time.  He is encouraged to walk.   Signed, Lars Masson MD, [facility] 12/19/2015

## 2015-12-19 NOTE — Progress Notes (Addendum)
Explained to pt that he was unable to leave unit to walk in hallway. Pt stated he just wanted to walk to [facility] hospital that it was a peaceful walk. Told pt that he can ambulate on unit. Paged NP oncall to see if pt can get order to leave unit to ambulate. Schorr, NP stated that pt could walk to [facility] hospital with staff. Will continue to monitor pt. Nelda Marseille, RN

## 2015-12-19 NOTE — Progress Notes (Signed)
UR COMPLETED  

## 2015-12-19 NOTE — Progress Notes (Addendum)
Kellie Chisolm Hallahan UJW:[id number] DOB: 12-18-1960 DOA: 12/13/2015 PCP: Kirt Boys, DO  Brief narrative:  51M with NICM ef 20-25 % [disinterested ppm]-last cath 07/31/13,Aflutter s/p DCCV 10/26/13, DM ty ii with complication Periph Neuropathy, OSA, Morbid obesity, Body mass index is 44.41 kg/(m^2).  Admitted to [facility] hospital c Acute respiratory failure-PNA vs AECHF  Consultants:  Cardiology  Procedures:  None  Antibiotics:   Ceftriaxone 1/19-1/20  Azithromycin 1/19-1/20  Levaquin 1/21-1/23    Subjective  Breathing starting to improve   Objective      Objective: Filed Vitals:   12/19/15 0545 12/19/15 0601 12/19/15 0752 12/19/15 1256  BP: 123/98  122/75 111/86  Pulse: 137 74 100 60  Temp: 98.7 F (37.1 C)     TempSrc:      Resp: 20   24  Height:      Weight: 127.2 kg (280 lb 6.8 oz)     SpO2: 100% 100%  100%    Intake/Output Summary (Last 24 hours) at 12/19/15 1621 Last data filed at 12/19/15 1404  Gross per 24 hour  Intake   1042 ml  Output   2450 ml  Net  -1408 ml    Exam:  General: Alert obese pleasant oriented no apparent distress no JVD no bruit Cardiovascular: S1-S2 no murmur rub or gallop Respiratory: no wheezes, faint basilar crackles Abdomen: Obese nontender nondistended no rebound Skin grade 1-2 lower extremity edema Neuro intact moving all 4 limbs equally  Data Reviewed: Basic Metabolic Panel:  Recent Labs Lab 12/16/15 0725 12/16/15 1253 12/17/15 0530 12/18/15 0601 12/19/15 0642  NA 135 137 137 137 137  K 3.7 4.1 4.4 3.9 3.8  CL 101 103 100* 102 97*  CO2 GLUCOSE 239* 226* 154* 234* 149*  BUN 29* 26* 30* 29* 27*  CREATININE 1.05 1.00 1.11 1.07 0.96  CALCIUM 8.7* 8.9 9.0 8.7* 9.2   Liver Function Tests:  Recent Labs Lab 12/13/15 1237 12/13/15 1627 12/14/15 1056  AST 49* 47* 40  ALT ALKPHOS 60 58 56  BILITOT 1.2 1.2 1.0  PROT 6.6 6.4* 6.4*  ALBUMIN 2.9* 2.9* 2.8*   No results for input(s):  LIPASE, AMYLASE in the last 168 hours. No results for input(s): AMMONIA in the last 168 hours. CBC:  Recent Labs Lab 12/13/15 1237 12/13/15 1627 12/14/15 1056 12/17/15 0530 12/18/15 0601  WBC 4.3 4.1 3.8* 7.8 8.3  NEUTROABS  --  2.9 2.4 5.8  --   HGB 12.4* 12.7* 12.2* 14.3 13.5  HCT 39.2 39.9 37.2* 43.2 41.6  MCV 92.2 91.3 91.2 90.2 90.4  PLT 217 201 183 283 293   Cardiac Enzymes:  Recent Labs Lab 12/13/15 1627 12/14/15 0401 12/14/15 1056 12/14/15 1448  TROPONINI 0.15* 0.16* 0.12* 0.11*   BNP: Invalid input(s): POCBNP CBG:  Recent Labs Lab 12/18/15 1220 12/18/15 1745 12/18/15 2145 12/19/15 0855 12/19/15 1140  GLUCAP 283* 217* 374* 188* 325*    No results found for this or any previous visit (from the past 240 hour(s)).   Studies:              All Imaging reviewed and is as per above notation   Scheduled Meds: . allopurinol  100 mg Oral Daily  . budesonide  1 mg Nebulization BID  . carvedilol  12.5 mg Oral BID WC  . colchicine  0.6 mg Oral Daily  . furosemide  80 mg Intravenous BID  . gabapentin  600 mg Oral  BID  . guaiFENesin  600 mg Oral BID  . insulin aspart  0-15 Units Subcutaneous TID WC  . insulin aspart  0-5 Units Subcutaneous QHS  . ipratropium  0.5 mg Nebulization BID  . losartan  12.5 mg Oral Daily  . nicotine  7 mg Transdermal Daily  . pantoprazole  40 mg Oral Daily  . [START ON 12/20/2015] predniSONE  20 mg Oral Q breakfast  . rivaroxaban  20 mg Oral Q supper  . zolpidem  5 mg Oral QHS   Continuous Infusions:    Assessment/Plan:  1. Acute respiratory failure - secondary to Multifactorial dyspnea-CHF> pneumonia and OSA/OHS -improving, see below  2. Community-acquired pneumonia -was on IV ceftriaxone and azithromycin, changed to PO Levaquin,  -completed course of antibiotics 1/23. -stop Prednisone dont think he needs this  3. Acute on chronic systolic CHF/nonischemic cardiomyopathy-EF 35-40%.       -was on IV  Lasix 40mg  BID-->  80 mg IV bid on 12/15/14.        -Cards following, now improving, 2.8 L negative      -continue losartan, Coreg   4.   Paroxysmal atrial flutter     -   status post DCC V 2014-continue Coreg.Continue Xarelto   5. OSA      -continue CPAP  4. Obesity-BMI is 44-patient encouraged regarding weight loss  5. 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 Gout       -continue colchicine 0.6 daily  full code NO family at bedside DIspo: home in 1-2days   Zannie Cove, MD  [facility] Pager [phone]  12/19/2015, 4:21 PM    LOS: 5 days

## 2015-12-20 DIAGNOSIS — R7989 Other specified abnormal findings of blood chemistry: Secondary | ICD-10-CM

## 2015-12-20 DIAGNOSIS — R778 Other specified abnormalities of plasma proteins: Secondary | ICD-10-CM | POA: Insufficient documentation

## 2015-12-20 LAB — BASIC METABOLIC PANEL
Anion gap: 10 (ref 5–15)
BUN: 25 mg/dL — AB (ref 6–20)
CO2: 29 mmol/L (ref 22–32)
CREATININE: 0.96 mg/dL (ref 0.61–1.24)
Calcium: 8.9 mg/dL (ref 8.9–10.3)
Chloride: 99 mmol/L — ABNORMAL LOW (ref 101–111)
GFR calc Af Amer: >60 mL/min (ref >60)
GLUCOSE: 144 mg/dL — AB (ref 65–99)
Potassium: 3.8 mmol/L (ref 3.5–5.1)
SODIUM: 138 mmol/L (ref 135–145)

## 2015-12-20 LAB — GLUCOSE, CAPILLARY
GLUCOSE-CAPILLARY: 140 mg/dL — AB (ref 65–99)
GLUCOSE-CAPILLARY: 263 mg/dL — AB (ref 65–99)
GLUCOSE-CAPILLARY: 205 mg/dL — AB (ref 65–99)
Glucose-Capillary: 227 mg/dL — ABNORMAL HIGH (ref 65–99)

## 2015-12-20 LAB — CBC
HCT: 43.9 % (ref 39.0–52.0)
Hemoglobin: 14.2 g/dL (ref 13.0–17.0)
MCH: 29.2 pg (ref 26.0–34.0)
MCHC: 32.3 g/dL (ref 30.0–36.0)
MCV: 90.1 fL (ref 78.0–100.0)
PLATELETS: 414 10*3/uL — AB (ref 150–400)
RBC: 4.87 MIL/uL (ref 4.22–5.81)
RDW: 14.5 % (ref 11.5–15.5)
WBC: 10.8 10*3/uL — AB (ref 4.0–10.5)

## 2015-12-20 MED ORDER — FUROSEMIDE 80 MG PO TABS
160.0000 mg | ORAL_TABLET | Freq: Every day | ORAL | Status: DC
  Administered 2015-12-20 – 2015-12-21 (×2): 160 mg via ORAL
  Filled 2015-12-20 (×2): qty 2

## 2015-12-20 MED ORDER — METOLAZONE 2.5 MG PO TABS
2.5000 mg | ORAL_TABLET | Freq: Every day | ORAL | Status: DC
  Administered 2015-12-20 – 2015-12-21 (×2): 2.5 mg via ORAL
  Filled 2015-12-20 (×2): qty 1

## 2015-12-20 NOTE — Progress Notes (Signed)
Jeremiah Perkins GYF:[id number] DOB: 05-22-1961 DOA: 12/13/2015 PCP: Kirt Boys, DO  Brief narrative:  35M with NICM ef 20-25 % [disinterested ppm]-last cath 07/31/13,Aflutter s/p DCCV 10/26/13, DM ty ii with complication Periph Neuropathy, OSA, Morbid obesity, Body mass index is 44.41 kg/(m^2).  Admitted to [facility] hospital c Acute respiratory failure-PNA vs AECHF  Assessment/Plan:  1. Acute respiratory failure - secondary to Multifactorial dyspnea-CHF> pneumonia and OSA/OHS -improving, see below  2. Community-acquired pneumonia -was on IV ceftriaxone and azithromycin, changed to PO Levaquin,  -completed course of antibiotics 1/23. -stopped Prednisone   3. Acute on chronic systolic CHF/nonischemic cardiomyopathy-EF 35-40%.       -was on IV  Lasix 40mg  BID--> 80 mg IV bid on 12/15/14.        -Cards following, now improving, 4.2 L negative      -continue losartan, Coreg       -needs quick FU with Dr.Brackbill and Bmet at FU  4.   Paroxysmal atrial flutter     -   status post [facility] V 2014-continue Coreg.Continue Xarelto   5. OSA      -continue CPAP  4. Obesity-BMI is 44-patient encouraged regarding weight loss  5. 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 Gout       -continue colchicine 0.6 daily  full code NO family at bedside DIspo: home tomorrow if stable       Subjective  Breathing continues to improve, wants to walk in halls   Objective      Objective: Filed Vitals:   12/19/15 2258 12/20/15 0708 12/20/15 0830 12/20/15 1300  BP: 122/90 112/85  120/86  Pulse: 75 109  86  Temp: 97.9 F (36.6 C) 98.2 F (36.8 C)  98.1 F (36.7 C)  TempSrc: Axillary Oral  Oral  Resp: 20 22  20   Height:      Weight:  126.3 kg (278 lb 7.1 oz)    SpO2: 93% 100% 95% 97%    Intake/Output Summary (Last 24 hours) at 12/20/15 1343 Last data filed at 12/20/15 1215  Gross per 24 hour  Intake    222 ml  Output   3200 ml  Net   -2978 ml    Exam:  General: Alert obese pleasant oriented no apparent distress Cardiovascular: S1-S2 no murmur rub or gallop Respiratory: no wheezes, faint basilar crackles Abdomen: Obese nontender nondistended no rebound Skin grade 1-2 lower extremity edema Neuro intact moving all 4 limbs equally  Data Reviewed: Basic Metabolic Panel:  Recent Labs Lab 12/16/15 1253 12/17/15 0530 12/18/15 0601 12/19/15 0642 12/20/15 0713  NA 137 137 137 137 138  K 4.1 4.4 3.9 3.8 3.8  CL 103 100* 102 97* 99*  CO2 25 25 25 26 29   GLUCOSE 226* 154* 234* 149* 144*  BUN 26* 30* 29* 27* 25*  CREATININE 1.00 1.11 1.07 0.96 0.96  CALCIUM 8.9 9.0 8.7* 9.2 8.9   Liver Function Tests:  Recent Labs Lab 12/13/15 1627 12/14/15 1056  AST 47* 40  ALT 21 19  ALKPHOS 58 56  BILITOT 1.2 1.0  PROT 6.4* 6.4*  ALBUMIN 2.9* 2.8*   No results for input(s): LIPASE, AMYLASE in the last 168 hours. No results for input(s): AMMONIA in the last 168 hours. CBC:  Recent Labs Lab 12/13/15 1627 12/14/15 1056 12/17/15 0530 12/18/15 0601 12/20/15 0713  WBC 4.1 3.8* 7.8 8.3 10.8*  NEUTROABS 2.9 2.4 5.8  --   --  HGB 12.7* 12.2* 14.3 13.5 14.2  HCT 39.9 37.2* 43.2 41.6 43.9  MCV 91.3 91.2 90.2 90.4 90.1  PLT 201 183 283 293 414*   Cardiac Enzymes:  Recent Labs Lab 12/13/15 1627 12/14/15 0401 12/14/15 1056 12/14/15 1448  TROPONINI 0.15* 0.16* 0.12* 0.11*   BNP: Invalid input(s): POCBNP CBG:  Recent Labs Lab 12/19/15 1140 12/19/15 1706 12/19/15 2253 12/20/15 0757 12/20/15 1236  GLUCAP 325* 289* 249* 140* 263*    Recent Results (from the past 240 hour(s))  Culture, sputum-assessment     Status: None   Collection Time: 12/19/15 10:41 PM  Result Value Ref Range Status   Specimen Description SPUTUM  Final   Special Requests NONE  Final   Sputum evaluation   Final    THIS SPECIMEN IS ACCEPTABLE. RESPIRATORY CULTURE REPORT TO FOLLOW.   Report Status 12/19/2015 FINAL  Final  Culture,  respiratory (NON-Expectorated)     Status: None (Preliminary result)   Collection Time: 12/19/15 10:41 PM  Result Value Ref Range Status   Specimen Description SPUTUM  Final   Special Requests NONE  Final   Gram Stain   Final    ABUNDANT WBC PRESENT, PREDOMINANTLY PMN FEW SQUAMOUS EPITHELIAL CELLS PRESENT MODERATE GRAM POSITIVE COCCI IN PAIRS IN CHAINS RARE GRAM POSITIVE RODS RARE GRAM NEGATIVE RODS    Culture PENDING  Incomplete   Report Status PENDING  Incomplete     Studies:              All Imaging reviewed and is as per above notation   Scheduled Meds: . allopurinol  100 mg Oral Daily  . budesonide  1 mg Nebulization BID  . carvedilol  12.5 mg Oral BID WC  . colchicine  0.6 mg Oral Daily  . furosemide  160 mg Oral Daily  . gabapentin  600 mg Oral BID  . guaiFENesin  600 mg Oral BID  . insulin aspart  0-15 Units Subcutaneous TID WC  . insulin aspart  0-5 Units Subcutaneous QHS  . ipratropium  0.5 mg Nebulization BID  . losartan  12.5 mg Oral Daily  . metolazone  2.5 mg Oral Daily  . nicotine  7 mg Transdermal Daily  . rivaroxaban  20 mg Oral Q supper  . zolpidem  5 mg Oral QHS   Continuous Infusions:   Zannie Cove, MD  [facility] Pager [phone]  12/20/2015, 1:43 PM    LOS: 6 days

## 2015-12-20 NOTE — Progress Notes (Addendum)
Patient Name: Jeremiah Perkins Date of Encounter: 12/20/2015  Active Problems:   Diabetes mellitus type 2, controlled, with complications (HCC)   Diabetic peripheral neuropathy (HCC)   OSA (obstructive sleep apnea)   Long term current use of anticoagulant therapy   Asthma exacerbation   Elevated troponin   Acute on chronic systolic heart failure, NYHA class 3 (HCC)   COPD exacerbation (HCC)   CAP (community acquired pneumonia)   Length of Stay: 6  SUBJECTIVE  SOB and LE edema has slightly improved. He hasn't walked today yet.  CURRENT MEDS  . allopurinol  100 mg Oral Daily  . budesonide  1 mg Nebulization BID  . carvedilol  12.5 mg Oral BID WC  . colchicine  0.6 mg Oral Daily  . furosemide  160 mg Oral BID  . gabapentin  600 mg Oral BID  . guaiFENesin  600 mg Oral BID  . insulin aspart  0-15 Units Subcutaneous TID WC  . insulin aspart  0-5 Units Subcutaneous QHS  . ipratropium  0.5 mg Nebulization BID  . losartan  12.5 mg Oral Daily  . metolazone  2.5 mg Oral Daily  . nicotine  7 mg Transdermal Daily  . rivaroxaban  20 mg Oral Q supper  . zolpidem  5 mg Oral QHS    OBJECTIVE  Filed Vitals:   12/19/15 2120 12/19/15 2258 12/20/15 0708 12/20/15 0830  BP:  122/90 112/85   Pulse:  75 109   Temp:  97.9 F (36.6 C) 98.2 F (36.8 C)   TempSrc:  Axillary Oral   Resp:  20 22   Height:      Weight:   278 lb 7.1 oz (126.3 kg)   SpO2: 100% 93% 100% 95%    Intake/Output Summary (Last 24 hours) at 12/20/15 1031 Last data filed at 12/20/15 0907  Gross per 24 hour  Intake    222 ml  Output   2900 ml  Net  -2678 ml   Filed Weights   12/18/15 0600 12/19/15 0545 12/20/15 0708  Weight: 274 lb 8 oz (124.512 kg) 280 lb 6.8 oz (127.2 kg) 278 lb 7.1 oz (126.3 kg)    PHYSICAL EXAM  General: Pleasant, NAD. Morbidly obese. Neuro: Alert and oriented X 3. Moves all extremities spontaneously. Psych: Normal affect. HEENT:  Normal  Neck: Supple without bruits or  JVD. Lungs:  Resp regular and unlabored, wheezing B/L. Heart: RRR no s3, s4, or murmurs. Abdomen: Soft, non-tender, non-distended, BS + x 4.  Extremities: No clubbing, cyanosis, B/L 1+ LE edema. DP/PT/Radials 2+ and equal bilaterally.  Accessory Clinical Findings  CBC  Recent Labs  12/18/15 0601 12/20/15 0713  WBC 8.3 10.8*  HGB 13.5 14.2  HCT 41.6 43.9  MCV 90.4 90.1  PLT 293 414*   Basic Metabolic Panel  Recent Labs  12/19/15 0642 12/20/15 0713  NA 137 138  K 3.8 3.8  CL 97* 99*  CO2 26 29  GLUCOSE 149* 144*  BUN 27* 25*  CREATININE 0.96 0.96  CALCIUM 9.2 8.9   Dg Chest 2 View  12/13/2015  CLINICAL DATA:  Cough for 5 days. Chest pain. Shortness of breath. COPD. EXAM: CHEST  2 VIEW COMPARISON:  12/13/2015 at 12:33 p.m. FINDINGS: Stable enlargement of the cardiopericardial silhouette with bilateral interstitial accentuation similar to today's earlier exam. Thoracic spondylosis. IMPRESSION: 1. Bilateral primarily interstitial accentuation similar to prior, favoring interstitial edema or atypical pneumonia. 2. Stable enlargement of the cardiopericardial silhouette.   Ct  Angio Chest Pe W/cm &/or Wo Cm  12/13/2015  CLINICAL DATA:  Acute onset of cough and shortness of breath. Extremity swelling. Generalized weakness and insomnia. Initial encounter.  IMPRESSION: 1. No evidence of significant pulmonary embolus. 2. Vascular congestion and cardiomegaly, with bilateral central ground-glass airspace opacities. This most likely reflects pulmonary edema, though infection could have a similar appearance. 3. Enlarged mediastinal nodes are more prominent than on the prior study, measuring up to 1.9 cm in short axis. Some of these would be amenable to biopsy, as deemed clinically appropriate. Relatively superior 2.2 cm epicardial fat pad node also noted. Electronically Signed     TELE: a-fib, rate controlled  Echo: 12/14/15  Left ventricle: The cavity size was normal. Systolic function  was moderately reduced. The estimated ejection fraction was in the range of 35% to 40%. Akinesis of the apicalanteroseptal and apical myocardium.  Impressions:  - Very technically difficult echo due to body habitus. We were not able to visualize all cardiac structures.   ASSESSMENT     Acute on chronic systolic heart failure, NYHA class 3 (HCC)   Elevated troponin    Ischemic cardiomyopathy, LVEF 35-40%   Chronic atrial fibrillation  Diabetes mellitus type 2, controlled, with complications (HCC)  Diabetic peripheral neuropathy (HCC)  OSA (obstructive sleep apnea)  Long term current use of anticoagulant therapy  Asthma exacerbation  Elevated troponin  COPD exacerbation (HCC)  CAP (community acquired pneumonia)  PLAN:   The patient is responding very well to the iv lasix, he would like to go home, we switched to lasix 160 mg po BID and metolazone prior to the first lasix in the am, he responded very well to that with mild hypokalemia. He feels better. He can be discharged today, we will arrange for a follow up with BMP prior to the visit in 10-14 days.  He should be started on KCL 20 mEq daily.  Signed, Lars Masson MD, [facility] 12/20/2015

## 2015-12-21 DIAGNOSIS — I10 Essential (primary) hypertension: Secondary | ICD-10-CM

## 2015-12-21 LAB — GLUCOSE, CAPILLARY
GLUCOSE-CAPILLARY: 215 mg/dL — AB (ref 65–99)
Glucose-Capillary: 206 mg/dL — ABNORMAL HIGH (ref 65–99)

## 2015-12-21 LAB — BASIC METABOLIC PANEL
ANION GAP: 13 (ref 5–15)
BUN: 26 mg/dL — ABNORMAL HIGH (ref 6–20)
CALCIUM: 8.8 mg/dL — AB (ref 8.9–10.3)
CHLORIDE: 95 mmol/L — AB (ref 101–111)
CO2: 30 mmol/L (ref 22–32)
CREATININE: 1.02 mg/dL (ref 0.61–1.24)
GFR calc non Af Amer: >60 mL/min (ref >60)
GLUCOSE: 150 mg/dL — AB (ref 65–99)
Potassium: 3.4 mmol/L — ABNORMAL LOW (ref 3.5–5.1)
Sodium: 138 mmol/L (ref 135–145)

## 2015-12-21 MED ORDER — POTASSIUM CHLORIDE ER 10 MEQ PO TBCR
20.0000 meq | EXTENDED_RELEASE_TABLET | Freq: Every day | ORAL | Status: DC
Start: 2015-12-22 — End: 2015-12-21

## 2015-12-21 MED ORDER — FUROSEMIDE 80 MG PO TABS: 160.0000 mg | ORAL_TABLET | Freq: Two times a day (BID) | ORAL | Status: DC

## 2015-12-21 MED ORDER — NICOTINE 7 MG/24HR TD PT24: 7.0000 mg | MEDICATED_PATCH | Freq: Every day | TRANSDERMAL | Status: DC

## 2015-12-21 MED ORDER — POTASSIUM CHLORIDE ER 20 MEQ PO TBCR: 20.0000 meq | EXTENDED_RELEASE_TABLET | Freq: Every day | ORAL | Status: DC

## 2015-12-21 MED ORDER — LEVALBUTEROL HCL 0.63 MG/3ML IN NEBU: 0.6300 mg | INHALATION_SOLUTION | RESPIRATORY_TRACT | Status: DC | PRN

## 2015-12-21 MED ORDER — IPRATROPIUM BROMIDE 0.02 % IN SOLN: 0.5000 mg | Freq: Two times a day (BID) | RESPIRATORY_TRACT | Status: DC

## 2015-12-21 MED ORDER — ALLOPURINOL 100 MG PO TABS: 100.0000 mg | ORAL_TABLET | Freq: Every day | ORAL | Status: DC

## 2015-12-21 MED ORDER — CARVEDILOL 12.5 MG PO TABS: 12.5000 mg | ORAL_TABLET | Freq: Two times a day (BID) | ORAL | Status: DC

## 2015-12-21 MED ORDER — BUDESONIDE 0.5 MG/2ML IN SUSP: 1.0000 mg | Freq: Two times a day (BID) | RESPIRATORY_TRACT | Status: DC

## 2015-12-21 MED ORDER — METOLAZONE 2.5 MG PO TABS: 2.5000 mg | ORAL_TABLET | Freq: Every day | ORAL | Status: DC

## 2015-12-21 MED ORDER — GUAIFENESIN ER 600 MG PO TB12: 600.0000 mg | ORAL_TABLET | Freq: Two times a day (BID) | ORAL | Status: DC

## 2015-12-21 MED ORDER — OXYCODONE HCL 5 MG PO TABS: 5.0000 mg | ORAL_TABLET | ORAL | Status: DC | PRN

## 2015-12-21 MED ORDER — POTASSIUM CHLORIDE CRYS ER 20 MEQ PO TBCR
40.0000 meq | EXTENDED_RELEASE_TABLET | Freq: Once | ORAL | Status: AC
  Administered 2015-12-21: 40 meq via ORAL
  Filled 2015-12-21: qty 2

## 2015-12-21 NOTE — Discharge Summary (Addendum)
Physician Discharge Summary   Patient ID: Jeremiah Perkins MRN: [id number] DOB/AGE: [birth year] 55 y.o.  Admit date: 12/13/2015 Discharge date: 12/21/2015  Primary Care Physician:  Kirt Boys, DO  Discharge Diagnoses:    Acute hypoxic respiratory failure, multifactorial  Community-acquired pneumonia  Acute on chronic systolic CHF exacerbation NYHA class 3 (HCC) Nonischemic cardiomyopathy,  . Diabetes mellitus type 2, controlled, with complications (HCC) . Diabetic peripheral neuropathy (HCC) . OSA (obstructive sleep apnea) COPD exacerbation    Consults: Cardiology  Recommendations for Outpatient Follow-up:  1. Patient encouraged for diet and weight control 2. Please repeat BMET at next visit 3. Torsemide discontinued, patient placed on Lasix 160 mg twice a day with Zaroxolyn, follow BMET   DIET: Heart healthy diet with carb  modified diet    Allergies:  No Active Allergies   DISCHARGE MEDICATIONS: Current Discharge Medication List    START taking these medications   Details  allopurinol (ZYLOPRIM) 100 MG tablet Take 1 tablet (100 mg total) by mouth daily. Qty: 30 tablet, Refills: 3    budesonide (PULMICORT) 0.5 MG/2ML nebulizer solution Take 4 mLs (1 mg total) by nebulization 2 (two) times daily. Qty: 60 mL, Refills: 12    furosemide (LASIX) 80 MG tablet Take 2 tablets (160 mg total) by mouth 2 (two) times daily. Qty: 120 tablet, Refills: 4    guaiFENesin (MUCINEX) 600 MG 12 hr tablet Take 1 tablet (600 mg total) by mouth 2 (two) times daily. Qty: 30 tablet, Refills: 0    ipratropium (ATROVENT) 0.02 % nebulizer solution Take 2.5 mLs (0.5 mg total) by nebulization 2 (two) times daily. Qty: 75 mL, Refills: 12    levalbuterol (XOPENEX) 0.63 MG/3ML nebulizer solution Take 3 mLs (0.63 mg total) by nebulization every 4 (four) hours as needed for wheezing. Qty: 60 mL, Refills: 12    metolazone (ZAROXOLYN) 2.5 MG tablet Take 1 tablet (2.5 mg total) by mouth  daily. Qty: 30 tablet, Refills: 3    nicotine (NICODERM CQ - DOSED IN MG/24 HR) 7 mg/24hr patch Place 1 patch (7 mg total) onto the skin daily. Qty: 28 patch, Refills: 0    potassium chloride 20 MEQ TBCR Take 20 mEq by mouth daily. Qty: 30 tablet, Refills: 3      CONTINUE these medications which have CHANGED   Details  carvedilol (COREG) 12.5 MG tablet Take 1 tablet (12.5 mg total) by mouth 2 (two) times daily with a meal. Qty: 60 tablet, Refills: 3    oxyCODONE (OXY IR/ROXICODONE) 5 MG immediate release tablet Take 1-2 tablets (5-10 mg total) by mouth every 4 (four) hours as needed for moderate pain, severe pain or breakthrough pain. Qty: 20 tablet, Refills: 0      CONTINUE these medications which have NOT CHANGED   Details  calcium carbonate (TUMS - DOSED IN MG ELEMENTAL CALCIUM) 500 MG chewable tablet Chew 1 tablet by mouth as needed for indigestion or heartburn. Reported on 12/13/2015    colchicine 0.6 MG tablet Take one tablet by mouth once daily for gout prevention Qty: 30 tablet, Refills: 3    diclofenac sodium (VOLTAREN) 1 % GEL Apply 2 g topically 4 (four) times daily. Reported on 12/13/2015    gabapentin (NEURONTIN) 600 MG tablet Take 600 mg by mouth 2 (two) times daily. Reported on 12/13/2015    levalbuterol (XOPENEX HFA) 45 MCG/ACT inhaler Inhale 1-2 puffs into the lungs every 4 (four) hours as needed for wheezing. Reported on 12/13/2015    losartan (COZAAR) 25 MG  tablet Take 12.5 mg by mouth daily. Reported on 12/13/2015    metFORMIN (GLUCOPHAGE) 1000 MG tablet Take 1 tablet (1,000 mg total) by mouth daily with breakfast. Qty: 90 tablet, Refills: 1    rivaroxaban (XARELTO) 20 MG TABS tablet Take 20 mg by mouth daily with supper. Reported on 12/13/2015    sildenafil (VIAGRA) 100 MG tablet Take 100 mg by mouth daily as needed for erectile dysfunction. Reported on 12/13/2015      STOP taking these medications     torsemide (DEMADEX) 20 MG tablet          Brief H  and P: For complete details please refer to admission H and P, but in brief 55M with NICM ef 20-25 % [disinterested ppm]-last cath 07/31/13, Aflutter s/p DCCV 10/26/13, DM type 2with complication Periph Neuropathy, OSA, Morbid obesity, Body mass index is 44.41 kg/(m^2). Patient was admitted with URI symptoms for 5 days, cough with tan-colored sputum with wheezing, chest pain with coughing. Patient also reported leg swelling bilaterally and had not been taking all his medications due to no appetite. CT angiogram in the ED was negative for PE however showed groundglass appearance of edema versus atypical pneumonia. Patient was admitted for Acute respiratory failure-PNA vs AECHF  Hospital Course:  1. Acute respiratory failure: With hypoxia, improved - secondary to Multifactorial dyspnea-due to acute on chronic systolic CHF, may require pneumonia, COPD exacerbation, underlying OSA/OHS -improving, O2 sats 97% on room air at the time of discharge. Cardiology was consulted, patient was placed on IV diuresis. Torsemide was held. Patient was net -8.5 L at the time of discharge. Weight down from 283 -> 272 lbs at discharge.  2. Community-acquired pneumonia -was on IV ceftriaxone and azithromycin, changed to PO Levaquin, patient has completed full course of antibiotics on 1/23. No wheezing currently prednisone has been discontinued.  3. Acute on chronic systolic CHF/nonischemic cardiomyopathy-EF 35-40%.  -Patient was placed on IV diuresis with Lasix. Cardiology was consulted and was further placed on aggressive IV Lasix to which he responded very well, currently negative balance of 8.5 L  at the time of discharge. Weight down from 283 -> 272 lbs at discharge. Per cardiology, no cardiac cath at this time. Cardiology recommended continuing Lasix 160 mg twice a day and metolazone prior to the first day 6 in the morning. Patient was also placed on potassium 20 mg daily. Patient was recommended to discontinue  torsemide.   -needs quick follow-up with Dr. Patty Sermons  and Bmet at FU      - 2-D echo showed EF of 35-40% with akinesis of the apical anteroseptal and apical myocardium.  4. Paroxysmal atrial flutter   - status post DCC V 2014-continue Coreg.Continue Xarelto   5. OSA   -continue CPAP  4. Obesity-BMI is 44-patient encouraged regarding weight loss  5. Diabetes mellitus type 2 with complications neuropathy  -Patient was placed on sliding scale insulin while inpatient, continue metformin  6. Gout  -continue colchicine 0.6 daily  Day of Discharge BP 133/85 mmHg  Pulse 118  Temp(Src) 98.1 F (36.7 C) (Oral)  Resp 18  Ht 5\' 7"  (1.702 m)  Wt 123.6 kg (272 lb 7.8 oz)  BMI 42.67 kg/m2  SpO2 97%  Physical Exam: General: Alert and awake oriented x3 not in any acute distress. HEENT: anicteric sclera, pupils reactive to light and accommodation CVS: S1-S2 clear no murmur rubs or gallops Chest: clear to auscultation bilaterally, no wheezing rales or rhonchi Abdomen: soft nontender, nondistended, normal bowel sounds  Extremities: no cyanosis, clubbing or edema noted bilaterally Neuro: Cranial nerves II-XII intact, no focal neurological deficits   The results of significant diagnostics from this hospitalization (including imaging, microbiology, ancillary and laboratory) are listed below for reference.    LAB RESULTS: Basic Metabolic Panel:  Recent Labs Lab 12/20/15 0713 12/21/15 0603  NA 138 138  K 3.8 3.4*  CL 99* 95*  CO2 29 30  GLUCOSE 144* 150*  BUN 25* 26*  CREATININE 0.96 1.02  CALCIUM 8.9 8.8*   Liver Function Tests: No results for input(s): AST, ALT, ALKPHOS, BILITOT, PROT, ALBUMIN in the last 168 hours. No results for input(s): LIPASE, AMYLASE in the last 168 hours. No results for input(s): AMMONIA in the last 168 hours. CBC:  Recent Labs Lab 12/17/15 0530 12/18/15 0601 12/20/15 0713  WBC 7.8 8.3 10.8*  NEUTROABS 5.8  --   --    HGB 14.3 13.5 14.2  HCT 43.2 41.6 43.9  MCV 90.2 90.4 90.1  PLT 283 293 414*   Cardiac Enzymes:  Recent Labs Lab 12/14/15 1448  TROPONINI 0.11*   BNP: Invalid input(s): POCBNP CBG:  Recent Labs Lab 12/21/15 0741 12/21/15 1229  GLUCAP 206* 215*    Significant Diagnostic Studies:  Dg Chest 2 View  12/13/2015  CLINICAL DATA:  Cough for 5 days. Chest pain. Shortness of breath. COPD. EXAM: CHEST  2 VIEW COMPARISON:  12/13/2015 at 12:33 p.m. FINDINGS: Stable enlargement of the cardiopericardial silhouette with bilateral interstitial accentuation similar to today's earlier exam. Thoracic spondylosis. IMPRESSION: 1. Bilateral primarily interstitial accentuation similar to prior, favoring interstitial edema or atypical pneumonia. 2. Stable enlargement of the cardiopericardial silhouette. Electronically Signed   By: Gaylyn Rong M.D.   On: 12/13/2015 19:08   Dg Chest 2 View  12/13/2015  CLINICAL DATA:  Productive cough.  Chest pain. EXAM: CHEST  2 VIEW COMPARISON:  07/23/2015 FINDINGS: Moderate enlargement of the cardiopericardial silhouette with bilateral abnormal interstitial accentuation favoring the mid lungs and lung bases. Tortuous thoracic aorta. No pleural effusion. Mild thoracic spondylosis. IMPRESSION: 1. Bilateral abnormal increased perihilar and basilar interstitial accentuation with underlying chronic moderate enlargement of the cardiopericardial silhouette. Appearance could be due to interstitial edema or atypical pneumonia. Electronically Signed   By: Gaylyn Rong M.D.   On: 12/13/2015 13:02   Ct Angio Chest Pe W/cm &/or Wo Cm  12/13/2015  CLINICAL DATA:  Acute onset of cough and shortness of breath. Extremity swelling. Generalized weakness and insomnia. Initial encounter. EXAM: CT ANGIOGRAPHY CHEST WITH CONTRAST TECHNIQUE: Multidetector CT imaging of the chest was performed using the standard protocol during bolus administration of intravenous contrast. Multiplanar  CT image reconstructions and MIPs were obtained to evaluate the vascular anatomy. CONTRAST:  OMNIPAQUE IOHEXOL 350 MG/ML SOLN COMPARISON:  CT of the chest performed 01/03/2015, and chest radiograph performed earlier today at 6:53 p.m. FINDINGS: There is no evidence of significant pulmonary embolus. Vascular congestion is noted, with bilateral central ground-glass airspace opacities. This most likely reflects pulmonary edema, though infection could have a similar appearance. There is no evidence of pleural effusion or pneumothorax. No masses are identified; no abnormal focal contrast enhancement is seen. The heart is enlarged. Enlarged mediastinal nodes are seen, measuring up to 1.9 cm in short axis, in the right paratracheal, periaortic, precarinal and subcarinal regions, and at the azygoesophageal recess. Incidental note is made of a retroesophageal right subclavian artery. The great vessels are grossly unremarkable in appearance. No pericardial effusion is identified. No axillary lymphadenopathy is seen.  The visualized portions of the thyroid gland are unremarkable in appearance. There is also a 2.2 cm epicardial fat pad node, located relatively superiorly. The visualized portions of the liver and spleen are unremarkable. Perinephric stranding is noted bilaterally. The visualized portions of the pancreas and adrenal glands are within normal limits. No acute osseous abnormalities are seen. Review of the MIP images confirms the above findings. IMPRESSION: 1. No evidence of significant pulmonary embolus. 2. Vascular congestion and cardiomegaly, with bilateral central ground-glass airspace opacities. This most likely reflects pulmonary edema, though infection could have a similar appearance. 3. Enlarged mediastinal nodes are more prominent than on the prior study, measuring up to 1.9 cm in short axis. Some of these would be amenable to biopsy, as deemed clinically appropriate. Relatively superior 2.2 cm  epicardial fat pad node also noted. Electronically Signed   By: Roanna Raider M.D.   On: 12/13/2015 23:37    2D ECHO: Study Conclusions  - Left ventricle: The cavity size was normal. Systolic function was moderately reduced. The estimated ejection fraction was in the range of 35% to 40%. Akinesis of the apicalanteroseptal and apical myocardium.  Impressions:  - Very technically difficult echo due to body habitus. We were not able to visualize all cardiac structures.  Disposition and Follow-up: Discharge Instructions    (HEART FAILURE PATIENTS) Call MD:  Anytime you have any of the following symptoms: 1) 3 pound weight gain in 24 hours or 5 pounds in 1 week 2) shortness of breath, with or without a dry hacking cough 3) swelling in the hands, feet or stomach 4) if you have to sleep on extra pillows at night in order to breathe.    Complete by:  As directed      Diet - low sodium heart healthy    Complete by:  As directed      Diet Carb Modified    Complete by:  As directed      Increase activity slowly    Complete by:  As directed             DISPOSITION: Home DISCHARGE FOLLOW-UP Follow-up Information    Follow up with Ronny Flurry, MD. Schedule an appointment as soon as possible for a visit in 2 weeks.   Specialty:  Cardiology   Why:  for hospital follow-up   Contact information:   [address] [place] [address] [phone]       Follow up with Kirt Boys, DO. Schedule an appointment as soon as possible for a visit in 2 weeks.   Specialty:  Internal Medicine   Why:  for hospital follow-up   Contact information:   [address] [place] [address] [phone]        Time spent on Discharge: 35 minutes  Signed:   RAI,RIPUDEEP M.D. [facility] 12/21/2015, 12:47 PM Pager: [phone]    Coding Query Patient has chronic kidney disease, stage II (mild)     RAI,RIPUDEEP M.D. [facility] 01/01/2016, 6:32  PM  Pager: [phone]

## 2015-12-21 NOTE — Care Management Important Message (Signed)
Important Message  Patient Details  Name: CROCKETT RIDDER MRN: [id number] Date of Birth: Apr 14, 1961   Medicare Important Message Given:  Yes    Kyla Balzarine 12/21/2015, 10:56 AM

## 2015-12-22 LAB — CULTURE, RESPIRATORY W GRAM STAIN: Culture: NORMAL

## 2015-12-22 LAB — CULTURE, RESPIRATORY

## 2015-12-24 ENCOUNTER — Telehealth: Payer: Self-pay | Admitting: Cardiology

## 2015-12-24 ENCOUNTER — Other Ambulatory Visit: Payer: Self-pay | Admitting: *Deleted

## 2015-12-24 NOTE — Telephone Encounter (Signed)
New message    Pt c/o medication issue:  1. Name of Medication: fuosemide 80 mg   2. How are you currently taking this medication (dosage and times per day)? Twice a daily   3. Are you having a reaction (difficulty breathing--STAT)? No   4. What is your medication issue? Recently discharge from hospital  / new medication was added.

## 2015-12-24 NOTE — Telephone Encounter (Signed)
Spoke with patient and he wanted to see if he could continue the [facility] like he was doing until he finished  Advised patient he needed to get all the medications filled that he was given at discharge from recent hospital stay and do as instructed Patient has follow up ov Friday with PCP. Advised to call after appointment to discuss follow up lab work if none done at that time

## 2015-12-25 ENCOUNTER — Telehealth: Payer: Self-pay

## 2015-12-25 MED ORDER — BUDESONIDE 0.5 MG/2ML IN SUSP: 1.0000 mg | Freq: Two times a day (BID) | RESPIRATORY_TRACT | Status: AC

## 2015-12-25 MED ORDER — LEVALBUTEROL HCL 0.63 MG/3ML IN NEBU: 0.6300 mg | INHALATION_SOLUTION | RESPIRATORY_TRACT | Status: AC | PRN

## 2015-12-25 MED ORDER — IPRATROPIUM BROMIDE 0.02 % IN SOLN: 0.5000 mg | Freq: Two times a day (BID) | RESPIRATORY_TRACT | Status: AC

## 2015-12-25 NOTE — Telephone Encounter (Signed)
Called patient to confirm that he has requested home delivery of diabetic testing supplies from [facility]. Patient is using this company for his supplies. Form will be completed and faxed back to [phone].

## 2015-12-25 NOTE — Telephone Encounter (Signed)
Prior authorizations were received for Ipratropium inhaler solution 30 x 2.67mL and Levalbuterol 0.63mg /25mL neb 24 x 88mL. Prior authorization was initiated by calling [phone]. Patient ID # [id number].  Insurance company stated that medications needed to be ran under Medicare Part B and to notify pharmacy to resubmit under correct claim.   Pharmacy stated that prescriptions needed to be written by PCP in order for insurance to cover medications. Pharmacy stated that PCP needed to write and  re-send prescriptions.   Patient has an appointment on 12-26-15, forms have been placed in appointment folder for PCP to review.

## 2015-12-25 NOTE — Addendum Note (Signed)
Addended by: Nelda Severe A on: 12/25/2015 12:09 PM   Modules accepted: Orders

## 2015-12-25 NOTE — Telephone Encounter (Signed)
Patient called back and needed his medications. Confirmed his appointment with him for tomorrow and he will come in to see Dr. Montez Morita tomorrow at 2:15. Rx's faxed to pharmacy.

## 2015-12-26 ENCOUNTER — Encounter: Payer: Self-pay | Admitting: Internal Medicine

## 2015-12-26 ENCOUNTER — Ambulatory Visit (INDEPENDENT_AMBULATORY_CARE_PROVIDER_SITE_OTHER): Payer: PPO | Admitting: Internal Medicine

## 2015-12-26 ENCOUNTER — Telehealth: Payer: Self-pay | Admitting: *Deleted

## 2015-12-26 VITALS — BP 122/70 | HR 108 | Temp 97.6°F | Resp 20 | Ht 67.0 in | Wt 264.8 lb

## 2015-12-26 DIAGNOSIS — J4530 Mild persistent asthma, uncomplicated: Secondary | ICD-10-CM | POA: Diagnosis not present

## 2015-12-26 DIAGNOSIS — M1A00X0 Idiopathic chronic gout, unspecified site, without tophus (tophi): Secondary | ICD-10-CM | POA: Diagnosis not present

## 2015-12-26 DIAGNOSIS — E118 Type 2 diabetes mellitus with unspecified complications: Secondary | ICD-10-CM

## 2015-12-26 DIAGNOSIS — I5022 Chronic systolic (congestive) heart failure: Secondary | ICD-10-CM

## 2015-12-26 DIAGNOSIS — Z720 Tobacco use: Secondary | ICD-10-CM

## 2015-12-26 DIAGNOSIS — I4892 Unspecified atrial flutter: Secondary | ICD-10-CM

## 2015-12-26 DIAGNOSIS — I10 Essential (primary) hypertension: Secondary | ICD-10-CM

## 2015-12-26 MED ORDER — BUPROPION HCL ER (SR) 150 MG PO TB12: ORAL_TABLET | ORAL | Status: DC

## 2015-12-26 NOTE — Patient Instructions (Signed)
Start wellbutrin daily x 3 then increase to every 12 hours for smoking cessation  Continue other medications as ordered  Follow up with cardiology as scheduled  Keep appt in March for routine visit  Will call with lab results

## 2015-12-26 NOTE — Telephone Encounter (Signed)
Received Fax from Cover [employer] Meds for patient 1. Budesonide Key: [id number]-sent to insurance for review. 2. Levalbuterol HCL Key: [id number]-sent to insurance for review.  Awaiting determination.

## 2015-12-26 NOTE — Progress Notes (Signed)
Patient ID: Jeremiah Perkins, male   DOB: [birth year], 55 y.o.   MRN: [id number]    Location:    [facility]   Place of Service:   OFFICE  Chief Complaint  Patient presents with  . Hospitalization Follow-up    cardiologist wants bloodwork,    HPI:  55 yo male seen today for hospital f/u. He was d/c'd with dx with acute hypoxic respiratory failure, CAP, A/C systolic HF (NYHA class 3), NICM, COPD exacerbation. Cardiology was consulted. He was tx with IV diuresis -->po lasix and zaroxolyn with net wt loss 283lb-->272lbs. Pneumonia tx with IV rocephin and azithromycin -->po levaquin. Completed course of abx 1/23. 2D echo revealed EF 35-40%.   HTN/CHF/aflutter - rate controlled on coreg. Takes losartan for BP control. Followed by cardio. He takes xeralto. He is down to 264 lbs today. He is still smoking cigs but is ready to quit after this recent hospitalization  Depression/anxiety - mood stable   Hyperlipidemia - diet controlled  Asthma/OSA/COPD - stable. Started on pulmicort/atroventxopenex nebs. He could not tolerate xopenex HFA  GERD - stable. He takes prn tums and zofran. He has frequent loose stools since his gallbladder surgery. He goes for BM about 10mn after meals  DM/neuropathy - BS at home stable. No low BS reactions. He takes metformin. gabapentin helps neuropathy  Arthritis/gout - pain uncontrolled. He takes roxicodone and uses voltaren gel. Occasional gout attacks. He no longer takes prednisone as it was causing slow wound healing. Allopurinol for gout maintenance  Past Medical History  Diagnosis Date  . Hypertension   . Congestive heart failure ([facility]     No echo data available. Myocardial perfusion 2005 with EF 61%, presumed diastolic HF   . Diverticulosis     with history of diverticulitis in 10/2011  . Major depression ([facility]   . Anxiety   . Psychosexual dysfunction with inhibited sexual excitement   . Hyperlipidemia   . Atrial flutter ([facility]   . Morbid obesity ([facility]   .  Chronic bronchitis ([facility]     "get it q yr" (01/02/2015)  . Shortness of breath     "all the time" (07/26/2013)  . Chronic back pain   . GERD (gastroesophageal reflux disease)   . Asthma   . Pneumonia 2013; 2014  . OSA (obstructive sleep apnea) 1998    "wear my CPAP a couple times/month" (01/02/2015)  . Diabetic peripheral neuropathy ([facility]   . Type II diabetes mellitus ([facility] dx'd 1991    previously on insulin in 2000 for 3-4 years (but was stopped because adamantly did not want to be on insulin)  . Arthritis     involving knees, ankles, back, elbows (01/02/2015)  . Gout   . Myocardial infarction ([facility]     "one doctor said I did back in 2014"  . Cholelithiasis   . COPD (chronic obstructive pulmonary disease) ([facility]     Past Surgical History  Procedure Laterality Date  . Ankle reconstruction Right 1990's    2/2 trauma sustained after fall  . 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 Cyst removal leg Left 1960's    back of  leg  . Tee without cardioversion N/A 07/28/2013    Procedure: TRANSESOPHAGEAL ECHOCARDIOGRAM (TEE);  Surgeon: DLarey Dresser MD;  [facility]  Service: Cardiovascular;  Laterality: N/A;  . Cardioversion N/A 07/28/2013    Procedure: CARDIOVERSION;  Surgeon: DLarey Dresser MD;  Location: [facility]  Service: Cardiovascular;  Laterality: N/A;  .  Cardioversion N/A 10/26/2013    Procedure: CARDIOVERSION;  Surgeon: [name], MD;  Location: [facility] ENDOSCOPY;  Service: Cardiovascular;  Laterality: N/A;  . Left heart catheterization with coronary angiogram N/A 07/27/2013    Procedure: LEFT HEART CATHETERIZATION WITH CORONARY ANGIOGRAM;  Surgeon: Peter M Martinique, MD;  Location: [facility] CATH LAB;  Service: Cardiovascular;  Laterality: N/A;  . Cardiac catheterization  1990's; 07/2013  . Finger amputation    . Cholecystectomy N/A 08/24/2015    Procedure: LAPAROSCOPIC CHOLECYSTECTOMY WITH INTRAOPERATIVE CHOLANGIOGRAM;  Surgeon: Jackolyn Confer, MD;  Location: [facility] ORS;  Service:  General;  Laterality: N/A;    Patient Care Team: [name], DO as PCP - General (Internal Medicine) [name], MD as Consulting Physician (Cardiology)  Social History   Social History  . Marital Status: Married    Spouse Name: N/A  . Number of Children: 3  . Years of Education: 14   Occupational History  . Now unemployed     Conservation officer, nature at [employer]  .  [employer]   Social History Main Topics  . Smoking status: Former Smoker -- 0.12 packs/day for 10 years    Types: Cigarettes    Quit date: 09/13/2015  . Smokeless tobacco: Never Used     Comment: rarely.  . Alcohol Use: 0.0 oz/week    0 Standard drinks or equivalent per week     Comment: OCCASIONAL  . Drug Use: No     Comment: LAST USED DEC 2015  . Sexual Activity: Not on file   Other Topics Concern  . Not on file   Social History Narrative   Previously worked as Conservation officer, nature and lives at home with his wife. They each have children but none between the two of them.      Diet:      Do you drink/ eat things with caffeine?  Yes      Marital status:   Married                             What year were you married ? 2010      Do you live in a house, apartment,assistred living, condo, trailer, etc.)? House      Is it one or more stories? Yes      How many persons live in your home ? 3      Do you have any pets in your home ?(please list) Dog      Current or past profession: Aircraft Mechanic/Instructor      Do you exercise?  Yes                            Type & how often:  Golf,cut grass, walk      Do you have a living will? No      Do you have a DNR form?   No                    If not, do you want to discuss one?       Do you have signed POA?HPOA forms?   No              If so, please bring to your        appointment           reports that he quit smoking about 3 months ago. His smoking use included Cigarettes. He has  a 1.2 pack-year smoking history. He has never used smokeless tobacco. He  reports that he drinks alcohol. He reports that he does not use illicit drugs.  No Active Allergies  Medications: Patient's Medications  New Prescriptions   No medications on file  Previous Medications   ALLOPURINOL (ZYLOPRIM) 100 MG TABLET    Take 1 tablet (100 mg total) by mouth daily.   BUDESONIDE (PULMICORT) 0.5 MG/2ML NEBULIZER SOLUTION    Take 4 mLs (1 mg total) by nebulization 2 (two) times daily.   CALCIUM CARBONATE (TUMS - DOSED IN MG ELEMENTAL CALCIUM) 500 MG CHEWABLE TABLET    Chew 1 tablet by mouth as needed for indigestion or heartburn. Reported on 12/13/2015   CARVEDILOL (COREG) 12.5 MG TABLET    Take 1 tablet (12.5 mg total) by mouth 2 (two) times daily with a meal.   COLCHICINE 0.6 MG TABLET    Take one tablet by mouth once daily for gout prevention   DICLOFENAC SODIUM (VOLTAREN) 1 % GEL    Apply 2 g topically 4 (four) times daily. Reported on 12/13/2015   FUROSEMIDE (LASIX) 80 MG TABLET    Take 2 tablets (160 mg total) by mouth 2 (two) times daily.   GABAPENTIN (NEURONTIN) 600 MG TABLET    Take 600 mg by mouth 2 (two) times daily. Reported on 12/13/2015   GUAIFENESIN (MUCINEX) 600 MG 12 HR TABLET    Take 1 tablet (600 mg total) by mouth 2 (two) times daily.   IPRATROPIUM (ATROVENT) 0.02 % NEBULIZER SOLUTION    Take 2.5 mLs (0.5 mg total) by nebulization 2 (two) times daily.   LEVALBUTEROL (XOPENEX HFA) 45 MCG/ACT INHALER    Inhale 1-2 puffs into the lungs every 4 (four) hours as needed for wheezing. Reported on 12/13/2015   LEVALBUTEROL (XOPENEX) 0.63 MG/3ML NEBULIZER SOLUTION    Take 3 mLs (0.63 mg total) by nebulization every 4 (four) hours as needed for wheezing.   LOSARTAN (COZAAR) 25 MG TABLET    Take 12.5 mg by mouth daily. Reported on 12/13/2015   METFORMIN (GLUCOPHAGE) 1000 MG TABLET    Take 1 tablet (1,000 mg total) by mouth daily with breakfast.   METOLAZONE (ZAROXOLYN) 2.5 MG TABLET    Take 1 tablet (2.5 mg total) by mouth daily.   NICOTINE (NICODERM CQ - DOSED IN MG/24  HR) 7 MG/24HR PATCH    Place 1 patch (7 mg total) onto the skin daily.   OXYCODONE (OXY IR/ROXICODONE) 5 MG IMMEDIATE RELEASE TABLET    Take 1-2 tablets (5-10 mg total) by mouth every 4 (four) hours as needed for moderate pain, severe pain or breakthrough pain.   POTASSIUM CHLORIDE 20 MEQ TBCR    Take 20 mEq by mouth daily.   RIVAROXABAN (XARELTO) 20 MG TABS TABLET    Take 20 mg by mouth daily with supper. Reported on 12/13/2015   SILDENAFIL (VIAGRA) 100 MG TABLET    Take 100 mg by mouth daily as needed for erectile dysfunction. Reported on 12/13/2015  Modified Medications   No medications on file  Discontinued Medications   No medications on file    Review of Systems  Respiratory: Positive for shortness of breath.   Neurological: Positive for weakness.  All other systems reviewed and are negative.   Filed Vitals:   12/26/15 1432  BP: 122/70  Pulse: 108  Temp: 97.6 F (36.4 C)  TempSrc: Oral  Resp: 20  Height: '5\' 7"'  (1.702 m)  Weight: 264 lb 12.8 oz (120.112  kg)  SpO2: 96%   Body mass index is 41.46 kg/(m^2).  Physical Exam  Constitutional: He is oriented to person, place, and time. He appears well-developed and well-nourished.  HENT:  Mouth/Throat: Oropharynx is clear and moist.  Eyes: Pupils are equal, round, and reactive to light. No scleral icterus.  Neck: Neck supple. Carotid bruit is not present. No thyromegaly present.  Cardiovascular: Normal rate, intact distal pulses and normal pulses.  An irregular rhythm present. Exam reveals no gallop and no friction rub.   Murmur heard.  Systolic murmur is present with a grade of 1/6  Trace LE edema b/l. No calf TTP  Pulmonary/Chest: Effort normal. He has no wheezes. He has rales (R>L basilar ). He exhibits no tenderness.  Abdominal: Soft. Bowel sounds are normal. He exhibits no distension, no abdominal bruit, no pulsatile midline mass and no mass. There is no tenderness. There is no rebound and no guarding.  Musculoskeletal: He  exhibits edema and tenderness.  Lymphadenopathy:    He has no cervical adenopathy.  Neurological: He is alert and oriented to person, place, and time.  Skin: Skin is warm and dry. No rash noted.  Psychiatric: He has a normal mood and affect. His behavior is normal. Judgment and thought content normal.     Labs reviewed: Admission on 12/13/2015, Discharged on 12/21/2015  No results displayed because visit has over 200 results.  CBC Latest Ref Rng 12/20/2015 12/18/2015 12/17/2015  WBC 4.0 - 10.5 K/uL 10.8(H) 8.3 7.8  Hemoglobin 13.0 - 17.0 g/dL 14.2 13.5 14.3  Hematocrit 39.0 - 52.0 % 43.9 41.6 43.2  Platelets 150 - 400 K/uL 414(H) 293 283    CMP Latest Ref Rng 12/21/2015 12/20/2015 12/19/2015  Glucose 65 - 99 mg/dL 150(H) 144(H) 149(H)  BUN 6 - 20 mg/dL 26(H) 25(H) 27(H)  Creatinine 0.61 - 1.24 mg/dL 1.02 0.96 0.96  Sodium 135 - 145 mmol/L 138 138 137  Potassium 3.5 - 5.1 mmol/L 3.4(L) 3.8 3.8  Chloride 101 - 111 mmol/L 95(L) 99(L) 97(L)  CO2 22 - 32 mmol/L '30 29 26  ' Calcium 8.9 - 10.3 mg/dL 8.8(L) 8.9 9.2  Total Protein 6.5 - 8.1 g/dL - - -  Total Bilirubin 0.3 - 1.2 mg/dL - - -  Alkaline Phos 38 - 126 U/L - - -  AST 15 - 41 U/L - - -  ALT 17 - 63 U/L - - -      Admission on 12/13/2015, Discharged on 12/13/2015  Component Date Value Ref Range Status  . Sodium 12/13/2015 136  135 - 145 mmol/L Final  . Potassium 12/13/2015 3.5  3.5 - 5.1 mmol/L Final  . Chloride 12/13/2015 101  101 - 111 mmol/L Final  . CO2 12/13/2015 24  22 - 32 mmol/L Final  . Glucose, Bld 12/13/2015 107* 65 - 99 mg/dL Final  . BUN 12/13/2015 13  6 - 20 mg/dL Final  . Creatinine, Ser 12/13/2015 0.95  0.61 - 1.24 mg/dL Final  . Calcium 12/13/2015 8.7* 8.9 - 10.3 mg/dL Final  . Total Protein 12/13/2015 6.6  6.5 - 8.1 g/dL Final  . Albumin 12/13/2015 2.9* 3.5 - 5.0 g/dL Final  . AST 12/13/2015 49* 15 - 41 U/L Final  . ALT 12/13/2015 20  17 - 63 U/L Final  . Alkaline Phosphatase 12/13/2015 60  38 - 126 U/L  Final  . Total Bilirubin 12/13/2015 1.2  0.3 - 1.2 mg/dL Final  . GFR calc non Af Amer 12/13/2015 >60  >60 mL/min Final  .  GFR calc Af Amer 12/13/2015 >60  >60 mL/min Final   Comment: (NOTE) The eGFR has been calculated using the CKD EPI equation. This calculation has not been validated in all clinical situations. eGFR's persistently <60 mL/min signify possible Chronic Kidney Disease.   . Anion gap 12/13/2015 11  5 - 15 Final  . WBC 12/13/2015 4.3  4.0 - 10.5 K/uL Final  . RBC 12/13/2015 4.25  4.22 - 5.81 MIL/uL Final  . Hemoglobin 12/13/2015 12.4* 13.0 - 17.0 g/dL Final  . HCT 12/13/2015 39.2  39.0 - 52.0 % Final  . MCV 12/13/2015 92.2  78.0 - 100.0 fL Final  . MCH 12/13/2015 29.2  26.0 - 34.0 pg Final  . MCHC 12/13/2015 31.6  30.0 - 36.0 g/dL Final  . RDW 12/13/2015 15.1  11.5 - 15.5 % Final  . Platelets 12/13/2015 217  150 - 400 K/uL Final    Dg Chest 2 View  12/13/2015  CLINICAL DATA:  Cough for 5 days. Chest pain. Shortness of breath. COPD. EXAM: CHEST  2 VIEW COMPARISON:  12/13/2015 at 12:33 p.m. FINDINGS: Stable enlargement of the cardiopericardial silhouette with bilateral interstitial accentuation similar to today's earlier exam. Thoracic spondylosis. IMPRESSION: 1. Bilateral primarily interstitial accentuation similar to prior, favoring interstitial edema or atypical pneumonia. 2. Stable enlargement of the cardiopericardial silhouette. Electronically Signed   By: Van Clines M.D.   On: 12/13/2015 19:08   Dg Chest 2 View  12/13/2015  CLINICAL DATA:  Productive cough.  Chest pain. EXAM: CHEST  2 VIEW COMPARISON:  07/23/2015 FINDINGS: Moderate enlargement of the cardiopericardial silhouette with bilateral abnormal interstitial accentuation favoring the mid lungs and lung bases. Tortuous thoracic aorta. No pleural effusion. Mild thoracic spondylosis. IMPRESSION: 1. Bilateral abnormal increased perihilar and basilar interstitial accentuation with underlying chronic moderate  enlargement of the cardiopericardial silhouette. Appearance could be due to interstitial edema or atypical pneumonia. Electronically Signed   By: Van Clines M.D.   On: 12/13/2015 13:02   Ct Angio Chest Pe W/cm &/or Wo Cm  12/13/2015  CLINICAL DATA:  Acute onset of cough and shortness of breath. Extremity swelling. Generalized weakness and insomnia. Initial encounter. EXAM: CT ANGIOGRAPHY CHEST WITH CONTRAST TECHNIQUE: Multidetector CT imaging of the chest was performed using the standard protocol during bolus administration of intravenous contrast. Multiplanar CT image reconstructions and MIPs were obtained to evaluate the vascular anatomy. CONTRAST:  175m OMNIPAQUE IOHEXOL 350 MG/ML SOLN COMPARISON:  CT of the chest performed 01/03/2015, and chest radiograph performed earlier today at 6:53 p.m. FINDINGS: There is no evidence of significant pulmonary embolus. Vascular congestion is noted, with bilateral central ground-glass airspace opacities. This most likely reflects pulmonary edema, though infection could have a similar appearance. There is no evidence of pleural effusion or pneumothorax. No masses are identified; no abnormal focal contrast enhancement is seen. The heart is enlarged. Enlarged mediastinal nodes are seen, measuring up to 1.9 cm in short axis, in the right paratracheal, periaortic, precarinal and subcarinal regions, and at the azygoesophageal recess. Incidental note is made of a retroesophageal right subclavian artery. The great vessels are grossly unremarkable in appearance. No pericardial effusion is identified. No axillary lymphadenopathy is seen. The visualized portions of the thyroid gland are unremarkable in appearance. There is also a 2.2 cm epicardial fat pad node, located relatively superiorly. The visualized portions of the liver and spleen are unremarkable. Perinephric stranding is noted bilaterally. The visualized portions of the pancreas and adrenal glands are within normal  limits. No acute osseous  abnormalities are seen. Review of the MIP images confirms the above findings. IMPRESSION: 1. No evidence of significant pulmonary embolus. 2. Vascular congestion and cardiomegaly, with bilateral central ground-glass airspace opacities. This most likely reflects pulmonary edema, though infection could have a similar appearance. 3. Enlarged mediastinal nodes are more prominent than on the prior study, measuring up to 1.9 cm in short axis. Some of these would be amenable to biopsy, as deemed clinically appropriate. Relatively superior 2.2 cm epicardial fat pad node also noted. Electronically Signed   By: Garald Balding M.D.   On: 12/13/2015 23:37     Assessment/Plan   ICD-9-CM ICD-10-CM   1. Chronic systolic heart failure (HCC) 428.22 I21.79 Basic Metabolic Panel     CMP     Lipid Panel  2. Asthma, chronic, mild persistent, uncomplicated 810.25 G86.28   3. Controlled type 2 diabetes mellitus with complication, without long-term current use of insulin (HCC) 250.90 E11.8 Hemoglobin A1c  4. Essential hypertension 401.9 I10 Lipid Panel  5. Idiopathic chronic gout without tophus, unspecified site 274.02 [id number]   6. Atrial flutter, unspecified 427.32 I48.92   7. Tobacco abuse 305.1 Z72.0 buPROPion (WELLBUTRIN SR) 150 MG 12 hr tablet    Start wellbutrin daily x 3 then increase to every 12 hours for smoking cessation  Continue other medications as ordered  Follow up with cardiology as scheduled  Keep appt in March for routine visit  Will call with lab results  [place]. Perlie Gold  [facility] [address] [place], [place] [address] [phone] Cell (Monday-Friday 8 AM - 5 PM) [phone] After 5 PM and follow prompts

## 2015-12-26 NOTE — Telephone Encounter (Signed)
Received fax from [employer] #[phone] the coverage for Levalbuterol has been APPROVED and Budesonide has been APPROVED Member ID# [id number]

## 2015-12-26 NOTE — Telephone Encounter (Signed)
New message   Blood work done PCP

## 2015-12-27 ENCOUNTER — Other Ambulatory Visit: Payer: Self-pay | Admitting: Internal Medicine

## 2015-12-27 ENCOUNTER — Telehealth: Payer: Self-pay

## 2015-12-27 LAB — LIPID PANEL
CHOL/HDL RATIO: 6.6 ratio — AB (ref 0.0–5.0)
CHOLESTEROL TOTAL: 197 mg/dL (ref 100–199)
HDL: 30 mg/dL — ABNORMAL LOW (ref >39)
LDL CALC: 94 mg/dL (ref 0–99)
Triglycerides: 364 mg/dL — ABNORMAL HIGH (ref 0–149)
VLDL CHOLESTEROL CAL: 73 mg/dL — AB (ref 5–40)

## 2015-12-27 LAB — COMPREHENSIVE METABOLIC PANEL
ALK PHOS: 112 IU/L (ref 39–117)
ALT: 50 IU/L — AB (ref 0–44)
AST: 52 IU/L — AB (ref 0–40)
Albumin/Globulin Ratio: 1.2 (ref 1.1–2.5)
Albumin: 4.1 g/dL (ref 3.5–5.5)
BUN / CREAT RATIO: 38 — AB (ref 9–20)
BUN: 56 mg/dL — AB (ref 6–24)
Bilirubin Total: 1 mg/dL (ref 0.0–1.2)
CALCIUM: 10.1 mg/dL (ref 8.7–10.2)
CO2: 25 mmol/L (ref 18–29)
CREATININE: 1.48 mg/dL — AB (ref 0.76–1.27)
Chloride: 82 mmol/L — ABNORMAL LOW (ref 96–106)
GFR calc Af Amer: 60 mL/min/{1.73_m2} (ref >59)
GFR, EST NON AFRICAN AMERICAN: 52 mL/min/{1.73_m2} — AB (ref >59)
GLOBULIN, TOTAL: 3.4 g/dL (ref 1.5–4.5)
Glucose: 247 mg/dL — ABNORMAL HIGH (ref 65–99)
Potassium: 3.9 mmol/L (ref 3.5–5.2)
SODIUM: 131 mmol/L — AB (ref 134–144)
Total Protein: 7.5 g/dL (ref 6.0–8.5)

## 2015-12-27 LAB — HEMOGLOBIN A1C
ESTIMATED AVERAGE GLUCOSE: 183 mg/dL
Hgb A1c MFr Bld: 8 % — ABNORMAL HIGH (ref 4.8–5.6)

## 2015-12-27 NOTE — Telephone Encounter (Signed)
Message left on triage voicemail. Patient forgot to mention at his visit yesterday that he needs something to assist him with sleep.

## 2015-12-27 NOTE — Telephone Encounter (Signed)
He was started on wellbutrin SR for smoking cessation. Let's see if that helps him sleep before rx something like [place]

## 2015-12-27 NOTE — Telephone Encounter (Signed)
Spoke with patient, patient verbalized understanding of Dr.Carte's response. Patient noted he will call back if his sleeping habits do not improve soon, patient said he has this bad cycle of waking up and eating late and that's not good for him.

## 2015-12-28 ENCOUNTER — Telehealth: Payer: Self-pay

## 2015-12-28 ENCOUNTER — Telehealth: Payer: Self-pay | Admitting: Cardiology

## 2015-12-28 MED ORDER — INSULIN PEN NEEDLE 31G X 8 MM MISC: Status: DC

## 2015-12-28 MED ORDER — INSULIN GLARGINE 100 UNIT/ML SOLOSTAR PEN: 10.0000 [IU] | PEN_INJECTOR | Freq: Every day | SUBCUTANEOUS | Status: DC

## 2015-12-28 NOTE — Telephone Encounter (Signed)
Please report.  The kidney function is worse.  Currently he is on Lasix on 160 mg twice a day and metolazone 2.5 mg once a day. We will need to cut back on his diuretics.  He should decrease his furosemide to just 80 mg twice a day and decrease his metolazone to just 2.5 mg every other day.  Recheck basal metabolic panel next week

## 2015-12-28 NOTE — Telephone Encounter (Signed)
Advised patient, verbalized understanding  

## 2015-12-28 NOTE — Telephone Encounter (Signed)
-----   Message from [place], [place] sent at 12/27/2015  3:38 PM EST ----- Kidney fxn worse - please f/u with cardio regarding fluid pills as they may need adjusting; LDL/bad cholesterol at goal but triglycerides elevated - watch fatty foods; DM uncontrolled - stop metformin due to worsening kidney fxn; start lantus solostar 10 units subcut qhs #5 pens with 3 RF; needs insulin teaching appt; check BS BID; othre labs stable; f/u as scheduled

## 2015-12-28 NOTE — Telephone Encounter (Signed)
Patient seen by PCP and had lab work done Kidney functions elevated and PCP recommended contacting cardiologist to review and monitor diuretics

## 2015-12-28 NOTE — Telephone Encounter (Signed)
New message     Patient calling  1.  C/O having changes in her condition   2. Discuss blood work   3. Wants to discuss with nurse PCP information

## 2015-12-28 NOTE — Telephone Encounter (Signed)
Discussed with patient, patient verbalized understanding of results. Patient states he does not need insulin teaching for he has used insulin before. I removed metformin and added Lantus. Patient also verified that he has a blood sugar machine at home.

## 2015-12-28 NOTE — Telephone Encounter (Signed)
Cassell Clement, MD at 12/28/2015 1:30 PM     Status: Signed       Expand All Collapse All   Please report. The kidney function is worse. Currently he is on Lasix on 160 mg twice a day and metolazone 2.5 mg once a day. We will need to cut back on his diuretics. He should decrease his furosemide to just 80 mg twice a day and decrease his metolazone to just 2.5 mg every other day. Recheck basal metabolic panel next week            Burnell Blanks at 12/28/2015 1:04 PM     Status: Signed       Expand All Collapse All   Patient seen by PCP and had lab work done Kidney functions elevated and PCP recommended contacting cardiologist to review and monitor diuretics

## 2015-12-31 ENCOUNTER — Other Ambulatory Visit: Payer: Self-pay

## 2015-12-31 NOTE — Patient Outreach (Signed)
[facility]) Care Management  12/31/2015  Phu Blankenburg Speth [birth year] [id number]   Telephone Screen  Referral Date: 12/28/15 Referral Source: [employer] (HTA) Referral Reason: "help with nebulizers/medciation costs, disease and symptom management and medication management, monitoring daily weights and blood sugar"   Outreach attempt # 1 to patient. No answer at present. RN CM left HIPAA compliant voicemail along with contact info.   Plan: RN CM will make outreach attempt to patient within a week.  Antionette Fairy, RN,BSN,CCM [facility] Care Management Telephonic Care Management Coordinator Direct Phone: [phone] Toll Free: [phone] Fax: [phone]

## 2016-01-01 ENCOUNTER — Other Ambulatory Visit: Payer: Self-pay | Admitting: *Deleted

## 2016-01-01 MED ORDER — DICLOFENAC SODIUM 1 % TD GEL: 2.0000 g | Freq: Four times a day (QID) | TRANSDERMAL | Status: DC

## 2016-01-01 NOTE — Telephone Encounter (Signed)
[employer] 

## 2016-01-03 ENCOUNTER — Ambulatory Visit (INDEPENDENT_AMBULATORY_CARE_PROVIDER_SITE_OTHER): Payer: PPO | Admitting: Cardiology

## 2016-01-03 ENCOUNTER — Telehealth: Payer: Self-pay | Admitting: *Deleted

## 2016-01-03 ENCOUNTER — Encounter: Payer: Self-pay | Admitting: Cardiology

## 2016-01-03 VITALS — BP 140/70 | HR 110 | Ht 67.0 in | Wt 267.1 lb

## 2016-01-03 DIAGNOSIS — N183 Chronic kidney disease, stage 3 unspecified: Secondary | ICD-10-CM

## 2016-01-03 DIAGNOSIS — I482 Chronic atrial fibrillation, unspecified: Secondary | ICD-10-CM

## 2016-01-03 DIAGNOSIS — I5022 Chronic systolic (congestive) heart failure: Secondary | ICD-10-CM | POA: Diagnosis not present

## 2016-01-03 LAB — BASIC METABOLIC PANEL
BUN: 47 mg/dL — ABNORMAL HIGH (ref 7–25)
CALCIUM: 9.1 mg/dL (ref 8.6–10.3)
CO2: 30 mmol/L (ref 20–31)
Chloride: 82 mmol/L — ABNORMAL LOW (ref 98–110)
Creat: 1.43 mg/dL — ABNORMAL HIGH (ref 0.70–1.33)
Glucose, Bld: 278 mg/dL — ABNORMAL HIGH (ref 65–99)
Potassium: 3.5 mmol/L (ref 3.5–5.3)
SODIUM: 126 mmol/L — AB (ref 135–146)

## 2016-01-03 NOTE — Progress Notes (Signed)
Cardiology Office Note   Date:  01/03/2016   ID:  Jeremiah Perkins, DOB 1961/05/31, MRN [id number]  PCP:  Kirt Boys, DO  Cardiologist: Cassell Clement MD  Chief Complaint  Patient presents with  . post hospital follow up    denies chest pain, shortness of breath, le edema  . Claudication    History of Present Illness: Jeremiah Perkins is a 55 y.o. male who presents for Scheduled follow-up visit.  The patient was recently hospitalized from 12/13/15 until 12/21/15 for acute respiratory insufficiency, community-acquired pneumonia, and exacerbation of acute on chronic systolic heart failure. He had a past history of paroxysmal atrial fibrillation. The patient underwent elective outpatient cardioversion on 10/26/13. Marland Kitchen He has a history of a nonischemic cardiomyopathy with previous catheterization showing no significant obstructive disease. His ejection fraction is estimated at 20-25%. He is diuresing on his current outpatient regimen. He still notes significant dyspnea with minimal exertion. Marland Kitchen He does have occasional chest discomfort. The patient also has a history of diabetes mellitus type 2 which is followed at the internal medicine clinic. He has a history of obstructive sleep apnea and morbid obesity and diabetic peripheral neuropathy. He has a past history of hypertension and gout. He has a past history of peripheral neuropathy and is on gabapentin The patient has finished the the cardiac rehabilitation program. The patient reiterated today that he is not interested in an ICD or a pacemaker. The patient states that his breathing has improved since hospitalization.  He is not coughing up any sputum.  His most recent echocardiogram on 12/14/15 showed an ejection fraction of 35-40%.  While in the hospital he was diuresed aggressively and was sent home on furosemide 160 mg twice a day and Zaroxolyn 2.5 mg daily.  He is no longer on torsemide.  Lab work on February 3 showed  increased BUN and creatinine and is diuretics were reduced.  He is now on Lasix 80 mg twice a day and metolazone 2.5 mg every other day Kirt Boys at [facility] care is his PCP  Past Medical History  Diagnosis Date  . Hypertension   . Congestive heart failure (HCC)     No echo data available. Myocardial perfusion 2005 with EF 61%, presumed diastolic HF   . Diverticulosis     with history of diverticulitis in 10/2011  . Major depression (HCC)   . Anxiety   . Psychosexual dysfunction with inhibited sexual excitement   . Hyperlipidemia   . Atrial flutter (HCC)   . Morbid obesity (HCC)   . Chronic bronchitis (HCC)     "get it q yr" (01/02/2015)  . Shortness of breath     "all the time" (07/26/2013)  . Chronic back pain   . GERD (gastroesophageal reflux disease)   . Asthma   . Pneumonia 2013; 2014  . OSA (obstructive sleep apnea) 1998    "wear my CPAP a couple times/month" (01/02/2015)  . Diabetic peripheral neuropathy (HCC)   . Type II diabetes mellitus (HCC) dx'd 1991    previously on insulin in 2000 for 3-4 years (but was stopped because adamantly did not want to be on insulin)  . Arthritis     involving knees, ankles, back, elbows (01/02/2015)  . Gout   . Myocardial infarction [facility])     "one doctor said I did back in 2014"  . Cholelithiasis   . COPD (chronic obstructive pulmonary disease) [facility])     Past Surgical History  Procedure Laterality Date  .  Ankle reconstruction Right 1990's    2/2 trauma sustained after fall  . 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 Cyst removal leg Left 1960's    back of  leg  . Tee without cardioversion N/A 07/28/2013    Procedure: TRANSESOPHAGEAL ECHOCARDIOGRAM (TEE);  Surgeon: Laurey Morale, MD;  Location: [facility] ENDOSCOPY;  Service: Cardiovascular;  Laterality: N/A;  . Cardioversion N/A 07/28/2013    Procedure: CARDIOVERSION;  Surgeon: Laurey Morale, MD;  Location: [facility] ENDOSCOPY;  Service: Cardiovascular;  Laterality: N/A;  . Cardioversion  N/A 10/26/2013    Procedure: CARDIOVERSION;  Surgeon: Cassell Clement, MD;  Location: [facility] ENDOSCOPY;  Service: Cardiovascular;  Laterality: N/A;  . Left heart catheterization with coronary angiogram N/A 07/27/2013    Procedure: LEFT HEART CATHETERIZATION WITH CORONARY ANGIOGRAM;  Surgeon: Peter M Swaziland, MD;  Location: [facility] CATH LAB;  Service: Cardiovascular;  Laterality: N/A;  . Cardiac catheterization  1990's; 07/2013  . Finger amputation    . Cholecystectomy N/A 08/24/2015    Procedure: LAPAROSCOPIC CHOLECYSTECTOMY WITH INTRAOPERATIVE CHOLANGIOGRAM;  Surgeon: Avel Peace, MD;  Location: [facility] ORS;  Service: General;  Laterality: N/A;     Current Outpatient Prescriptions  Medication Sig Dispense Refill  . allopurinol (ZYLOPRIM) 100 MG tablet Take 1 tablet (100 mg total) by mouth daily. 30 tablet 3  . budesonide (PULMICORT) 0.5 MG/2ML nebulizer solution Take 4 mLs (1 mg total) by nebulization 2 (two) times daily. 60 mL 12  . buPROPion (WELLBUTRIN SR) 150 MG 12 hr tablet Take 1 tab po daily x 3 days then increase to q12hrs 60 tablet 6  . calcium carbonate (TUMS - DOSED IN MG ELEMENTAL CALCIUM) 500 MG chewable tablet Chew 1 tablet by mouth as needed for indigestion or heartburn. Reported on 12/13/2015    . carvedilol (COREG) 12.5 MG tablet Take 1 tablet (12.5 mg total) by mouth 2 (two) times daily with a meal. 60 tablet 3  . colchicine 0.6 MG tablet Take one tablet by mouth once daily for gout prevention 30 tablet 3  . diclofenac sodium (VOLTAREN) 1 % GEL Apply 2 g topically 4 (four) times daily. 100 g 0  . furosemide (LASIX) 80 MG tablet Take 80 mg by mouth 2 (two) times daily.    Marland Kitchen gabapentin (NEURONTIN) 600 MG tablet Take 600 mg by mouth 2 (two) times daily. Reported on 12/13/2015    . glucose blood (ONE TOUCH ULTRA TEST) test strip Check blood sugar twice daily E11.8    . guaiFENesin (MUCINEX) 600 MG 12 hr tablet Take 1 tablet (600 mg total) by mouth 2 (two) times daily. 30 tablet 0  . Insulin  Glargine (LANTUS SOLOSTAR) 100 UNIT/ML Solostar Pen Inject 10 Units into the skin daily at 10 pm. E11.8 15 mL 3  . Insulin Pen Needle 31G X 8 MM MISC Use daily with the administration of lantus E11.8 100 each 1  . ipratropium (ATROVENT) 0.02 % nebulizer solution Take 2.5 mLs (0.5 mg total) by nebulization 2 (two) times daily. 75 mL 12  . levalbuterol (XOPENEX HFA) 45 MCG/ACT inhaler Inhale 1-2 puffs into the lungs every 4 (four) hours as needed for wheezing. Reported on 12/13/2015    . levalbuterol (XOPENEX) 0.63 MG/3ML nebulizer solution Take 3 mLs (0.63 mg total) by nebulization every 4 (four) hours as needed for wheezing. 60 mL 12  . losartan (COZAAR) 25 MG tablet Take 12.5 mg by mouth daily. Reported on 12/13/2015    . metolazone (ZAROXOLYN) 2.5 MG tablet  Take 2.5 mg by mouth every other day.    . nicotine (NICODERM CQ - DOSED IN MG/24 HR) 7 mg/24hr patch Place 1 patch (7 mg total) onto the skin daily. 28 patch 0  . oxyCODONE (OXY IR/ROXICODONE) 5 MG immediate release tablet Take 1-2 tablets (5-10 mg total) by mouth every 4 (four) hours as needed for moderate pain, severe pain or breakthrough pain. 20 tablet 0  . potassium chloride 20 MEQ TBCR Take 20 mEq by mouth daily. 30 tablet 3  . rivaroxaban (XARELTO) 20 MG TABS tablet Take 20 mg by mouth daily with supper. Reported on 12/13/2015    . sildenafil (VIAGRA) 100 MG tablet Take 100 mg by mouth daily as needed for erectile dysfunction. Reported on 12/13/2015     No current facility-administered medications for this visit.    Allergies:   Review of patient's allergies indicates no active allergies.    Social History:  The patient  reports that he quit smoking about 3 months ago. His smoking use included Cigarettes. He has a 1.2 pack-year smoking history. He has never used smokeless tobacco. He reports that he drinks alcohol. He reports that he does not use illicit drugs.   Family History:  The patient's family history includes Alzheimer's disease  in his maternal grandmother and mother; Breast cancer in his paternal aunt; CAD (age of onset: 39) in his brother; CAD (age of onset: 31) in his mother; Diabetes in his maternal aunt, maternal grandmother, and mother; Gout in his father; Hypertension in his brother and mother; Lung cancer in his father; Seizures in his brother and paternal uncle; Stomach cancer in his father.    ROS:  Please see the history of present illness.   Otherwise, review of systems are positive for none.   All other systems are reviewed and negative.    PHYSICAL EXAM: VS:  BP 140/70 mmHg  Pulse 110  Ht  (1.702 m)  Wt 267 lb 1.9 oz (121.165 kg)  BMI 41.83 kg/m2 , BMI Body mass index is 41.83 kg/(m^2). GEN: Well nourished, well developed, in no acute distress HEENT: normal Neck: no JVD, carotid bruits, or masses Cardiac: Irregularly irregular; no murmurs, rubs, or gallops,no edema  Respiratory:  clear to auscultation bilaterally, normal work of breathing GI: soft, nontender, nondistended, + BS MS: no deformity or atrophy Skin: warm and dry, no rash Neuro:  Strength and sensation are intact Psych: euthymic mood, full affect   EKG:  EKG is ordered today. The ekg ordered today demonstrates Atrial fibrillation with rapid ventricular response 112 bpm.  Occasional PVCs.  Low voltage QRS.   Recent Labs: 12/13/2015: B Natriuretic Peptide 197.5* 12/20/2015: Hemoglobin 14.2; Platelets 414* 12/26/2015: ALT 50*; BUN 56*; Creatinine, Ser 1.48*; Potassium 3.9; Sodium 131*    Lipid Panel    Component Value Date/Time   CHOL 197 12/26/2015 1541   CHOL 186 01/04/2015 0518   TRIG 364* 12/26/2015 1541   HDL 30* 12/26/2015 1541   HDL 33* 01/04/2015 0518   CHOLHDL 6.6* 12/26/2015 1541   CHOLHDL 5.6 01/04/2015 0518   VLDL 40 01/04/2015 0518   LDLCALC 94 12/26/2015 1541   LDLCALC 113* 01/04/2015 0518      Wt Readings from Last 3 Encounters:  01/03/16 267 lb 1.9 oz (121.165 kg)  12/26/15 264 lb 12.8 oz (120.112 kg)   12/21/15 272 lb 7.8 oz (123.6 kg)        ASSESSMENT AND PLAN:  1. Paroxysmal atrial flutter fibrillation,, now back in atrial  Fibrillation since previous EKG in March 2016 showing normal sinus rhythm. He has been unable to hold normal sinus rhythm. Therefore previous amiodarone was stopped at a previous visit. Strategy is rate control and permanent anticoagulation. 2. Chronic systolic heart failureWith recent exacerbation. 3. Diabetic peripheral neuropathy 4. Morbid obesity 5. Erectile dysfunction. He states that he has an appointment with urology pending 6.Acute on chronic kidney disease.We are titrating back on his diuretics because of his worsening renal function.  We are repeating a BMET today.  Current medicines are reviewed at length with the patient today.  The patient does not have concerns regarding medicines.  The following changes have been made:  no change  Labs/ tests ordered today include:   Orders Placed This Encounter  Procedures  . Basic metabolic panel  . EKG 12-Lead     Disposition:   Continue current medication.  Await results of today's labs and then adjust diuretics further if necessary.  His weight is down 18 pounds since admission to the hospital.  Continue with intentional weight loss and caloric restriction.  The patient will return in 2 months for a follow-up visit with Dr. Elease Hashimoto.  Karie Schwalbe MD 01/03/2016 1:28 PM    [facility] [address] [place], [place], [place]  [address] Phone: [phone]; Fax: [phone]

## 2016-01-03 NOTE — Telephone Encounter (Signed)
Called and requested refill at the request of patient Per [employer] order already placed    [employer] reorder for viagra  Reorder phone [phone]  Patient id # [phone]

## 2016-01-03 NOTE — Patient Instructions (Signed)
Medication Instructions:  Your physician recommends that you continue on your current medications as directed. Please refer to the Current Medication list given to you today.  Labwork: bmet  Testing/Procedures: none  Follow-Up: Your physician recommends that you schedule a follow-up appointment in: 2 month ov with Dr Elease Hashimoto   If you need a refill on your cardiac medications before your next appointment, please call your pharmacy.

## 2016-01-04 ENCOUNTER — Other Ambulatory Visit: Payer: Self-pay | Admitting: Cardiology

## 2016-01-04 ENCOUNTER — Ambulatory Visit: Payer: PPO

## 2016-01-04 ENCOUNTER — Other Ambulatory Visit: Payer: Self-pay

## 2016-01-04 ENCOUNTER — Ambulatory Visit: Payer: Self-pay

## 2016-01-04 ENCOUNTER — Telehealth: Payer: Self-pay | Admitting: *Deleted

## 2016-01-04 ENCOUNTER — Ambulatory Visit (INDEPENDENT_AMBULATORY_CARE_PROVIDER_SITE_OTHER): Payer: PPO | Admitting: Internal Medicine

## 2016-01-04 ENCOUNTER — Encounter: Payer: Self-pay | Admitting: Internal Medicine

## 2016-01-04 VITALS — BP 120/78 | HR 95 | Temp 97.6°F | Resp 20 | Ht 67.0 in | Wt 269.2 lb

## 2016-01-04 DIAGNOSIS — E871 Hypo-osmolality and hyponatremia: Secondary | ICD-10-CM

## 2016-01-04 DIAGNOSIS — E119 Type 2 diabetes mellitus without complications: Secondary | ICD-10-CM

## 2016-01-04 DIAGNOSIS — R2681 Unsteadiness on feet: Secondary | ICD-10-CM | POA: Diagnosis not present

## 2016-01-04 DIAGNOSIS — I5022 Chronic systolic (congestive) heart failure: Secondary | ICD-10-CM

## 2016-01-04 DIAGNOSIS — S2341XA Sprain of ribs, initial encounter: Secondary | ICD-10-CM | POA: Diagnosis not present

## 2016-01-04 DIAGNOSIS — H9313 Tinnitus, bilateral: Secondary | ICD-10-CM

## 2016-01-04 DIAGNOSIS — Z79899 Other long term (current) drug therapy: Secondary | ICD-10-CM

## 2016-01-04 DIAGNOSIS — Z794 Long term (current) use of insulin: Principal | ICD-10-CM

## 2016-01-04 MED ORDER — POTASSIUM CHLORIDE ER 20 MEQ PO TBCR: 20.0000 meq | EXTENDED_RELEASE_TABLET | Freq: Two times a day (BID) | ORAL | Status: DC

## 2016-01-04 MED ORDER — DICLOFENAC SODIUM 1 % TD GEL: 2.0000 g | Freq: Four times a day (QID) | TRANSDERMAL | Status: DC

## 2016-01-04 NOTE — Telephone Encounter (Signed)
-----   Message from Cassell Clement, MD sent at 01/04/2016  7:48 AM EST ----- Please report.  The kidneys are still too dry.  The potassium is borderline low.  I want him to stop the metolazone.  Increase his potassium to twice a day. Recheck a BMET in 2 weeks

## 2016-01-04 NOTE — Patient Outreach (Signed)
[facility]) Care Management  01/04/2016  Jeremiah Perkins [birth year] [id number]   Telephone Screen  Referral Date: 12/28/15 Referral Source: [employer] (HTA) Referral Reason: "help with nebulizers/medication costs, disease and symptom management and medication management, monitoring daily weights and blood sugar"   Voicemail message received from patient. Return call to patient. Screening completed.  Social: Patient resides in his home. He denies any issues with ADLS/IADLs. He drives himself to medical appointments. He reports that he has been getting confused a lot more lately since last hospitalization. Patient also reports he has noticed issues with his balance being off. He denies any recent falls. Denies using assistive devices.   Conditions: Patient has h/o DM, HTN, obesity, A-fib, CHF, HLD, COPD,GERD, cardiomyopathy(declined ICD and pacemaker), depression/anxiety. Patient was recently discharged from hospital on 12/21/15 for PNA/respiratory failure. He states he is monitoring cbgs and blood sugars have been ranging in the 160s-200s. He reports he is supposed to monitor BID but forgets most of the time and only checks in the morning. He voices having a scale in the home and does monitor wgt.  Medications: Patient reports he takes greater than 10 meds. He voices trouble being able to afford meds. He states he uses med planner and fills it himself. However, he states he normally "runs out of meds" before the end of the month. [facility] referral already pending for further assistance.  Appointments: Patient reports he did f/u with heart MD since discharge. He is unsure if he has seen PCP since discharge. Patient voiced that he will contact PCP to verify that he has been seen as well as to discuss changes in his condition(increased forgetfulness & issues with balance).  Consent: Patient gave verbal consent for [facility] services.   Plan: RN CM will send [facility] community  referral for TOC and further in home eval and assessment. RN CM provided patient with [facility] contact info. RN CM confirmed that patient will f/u with PCP regarding appointment and his concerns.  Antionette Fairy, RN,BSN,CCM [facility] Care Management Telephonic Care Management Coordinator Direct Phone: [phone] Toll Free: [phone] Fax: [phone]

## 2016-01-04 NOTE — Patient Outreach (Signed)
[facility]) Care Management  01/04/2016  [name] 04/12/1961 [id number]   Telephone Screen  Referral Date: 12/28/15 Referral Source: [employer] (HTA) Referral Reason: "help with nebulizers/medciation costs, disease and symptom management and medication management, monitoring daily weights and blood sugar"   Outreach attempt # 2 to patient. No answer. RN CM left HIPAA compliant voicemail message for patient along with contact info.  Plan: RN CM will make outreach attempt to patient within a week.  Antionette Fairy, RN,BSN,CCM [facility] Care Management Telephonic Care Management Coordinator Direct Phone: [phone] Toll Free: [phone] Fax: [phone]

## 2016-01-04 NOTE — Patient Instructions (Addendum)
STOP ZAROXOLYN (METALOZONE)   CONTINUE TAKING LASIX (FUROSEMIDE)  Change positions slowly to avoid dizziness  Continue other medications as ordered  Continue fluid restriction (1500 ml daily)  Follow up with cardiology for repeat labs in 2 weeks  May apply warm compress as needed to left rib for pain  Follow up for routine visit as scheduled

## 2016-01-04 NOTE — Addendum Note (Signed)
Addended by: Regis Bill B on: 01/04/2016 12:11 PM   Modules accepted: Orders, Medications

## 2016-01-04 NOTE — Progress Notes (Signed)
Patient ID: Jeremiah Perkins, male   DOB: 1961-09-15, 55 y.o.   MRN: [id number]    Location:    [facility]   Place of Service:   OFFICE  Chief Complaint  Patient presents with  . Medical Management of Chronic Issues    memory isssues since hospital visit,     HPI:  55 yo male seen today for memory loss, dizziness, tinnitus since last hospital stay for A/C systolic HF. He reports poor concentration and unsteady gait. BMP done yesterday revealed stable Cr 1.43 and Na 126. Cardio stopped zaroxolyn today and increased potassium to BID. He is rescheduled for BMP in 2 weeks. He is a poor historian due to memory loss. Hx obtained from chart  Past Medical History  Diagnosis Date  . Hypertension   . Congestive heart failure ([place])     No echo data available. Myocardial perfusion 2005 with EF 61%, presumed diastolic HF   . Diverticulosis     with history of diverticulitis in 10/2011  . Major depression ([place])   . Anxiety   . Psychosexual dysfunction with inhibited sexual excitement   . Hyperlipidemia   . Atrial flutter ([place])   . Morbid obesity ([place])   . Chronic bronchitis ([place])     "get it q yr" (01/02/2015)  . Shortness of breath     "all the time" (07/26/2013)  . Chronic back pain   . GERD (gastroesophageal reflux disease)   . Asthma   . Pneumonia 2013; 2014  . OSA (obstructive sleep apnea) 1998    "wear my CPAP a couple times/month" (01/02/2015)  . Diabetic peripheral neuropathy ([place])   . Type II diabetes mellitus ([place]) dx'd 1991    previously on insulin in 2000 for 3-4 years (but was stopped because adamantly did not want to be on insulin)  . Arthritis     involving knees, ankles, back, elbows (01/02/2015)  . Gout   . Myocardial infarction [facility])     "one doctor said I did back in 2014"  . Cholelithiasis   . COPD (chronic obstructive pulmonary disease) [facility])     Past Surgical History  Procedure Laterality Date  . Ankle reconstruction Right 1990's    2/2 trauma sustained after fall  .  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 Cyst removal leg Left 1960's    back of  leg  . Tee without cardioversion N/A 07/28/2013    Procedure: TRANSESOPHAGEAL ECHOCARDIOGRAM (TEE);  Surgeon: Larey Dresser, MD;  Location: [place];  Service: Cardiovascular;  Laterality: N/A;  . Cardioversion N/A 07/28/2013    Procedure: CARDIOVERSION;  Surgeon: Larey Dresser, MD;  Location: [place];  Service: Cardiovascular;  Laterality: N/A;  . Cardioversion N/A 10/26/2013    Procedure: CARDIOVERSION;  Surgeon: [name], MD;  Location: [place];  Service: Cardiovascular;  Laterality: N/A;  . Left heart catheterization with coronary angiogram N/A 07/27/2013    Procedure: LEFT HEART CATHETERIZATION WITH CORONARY ANGIOGRAM;  Surgeon: Peter M Martinique, MD;  Location: [facility] CATH LAB;  Service: Cardiovascular;  Laterality: N/A;  . Cardiac catheterization  1990's; 07/2013  . Finger amputation    . Cholecystectomy N/A 08/24/2015    Procedure: LAPAROSCOPIC CHOLECYSTECTOMY WITH INTRAOPERATIVE CHOLANGIOGRAM;  Surgeon: Jackolyn Confer, MD;  Location: [facility];  Service: General;  Laterality: N/A;    Patient Care Team: [name], DO as PCP - General (Internal Medicine) [name], MD as Consulting Physician (Cardiology) Tobi Bastos, RN as [place]  Care Management  Social History   Social History  . Marital Status: Married    Spouse Name: N/A  . Number of Children: 3  . Years of Education: 14   Occupational History  . Now unemployed     Conservation officer, nature at [employer]  .  [employer]   Social History Main Topics  . Smoking status: Former Smoker -- 0.12 packs/day for 10 years    Types: Cigarettes    Quit date: 09/13/2015  . Smokeless tobacco: Never Used     Comment: rarely.  . Alcohol Use: 0.0 oz/week    0 Standard drinks or equivalent per week     Comment: OCCASIONAL  . Drug Use: No     Comment: LAST USED DEC 2015  . Sexual Activity: Not on file   Other Topics Concern    . Not on file   Social History Narrative   Previously worked as Conservation officer, nature and lives at home with his wife. They each have children but none between the two of them.      Diet:      Do you drink/ eat things with caffeine?  Yes      Marital status:   Married                             What year were you married ? 2010      Do you live in a house, apartment,assistred living, condo, trailer, etc.)? House      Is it one or more stories? Yes      How many persons live in your home ? 3      Do you have any pets in your home ?(please list) Dog      Current or past profession: Aircraft Mechanic/Instructor      Do you exercise?  Yes                            Type & how often:  Golf,cut grass, walk      Do you have a living will? No      Do you have a DNR form?   No                    If not, do you want to discuss one?       Do you have signed POA?HPOA forms?   No              If so, please bring to your        appointment           reports that he quit smoking about 3 months ago. His smoking use included Cigarettes. He has a 1.2 pack-year smoking history. He has never used smokeless tobacco. He reports that he drinks alcohol. He reports that he does not use illicit drugs.  No Known Allergies  Medications: Patient's Medications  New Prescriptions   No medications on file  Previous Medications   ALLOPURINOL (ZYLOPRIM) 100 MG TABLET    Take 1 tablet (100 mg total) by mouth daily.   BUDESONIDE (PULMICORT) 0.5 MG/2ML NEBULIZER SOLUTION    Take 4 mLs (1 mg total) by nebulization 2 (two) times daily.   BUPROPION (WELLBUTRIN SR) 150 MG 12 HR TABLET    Take 1 tab po daily x 3 days then increase to q12hrs   CALCIUM CARBONATE (TUMS - DOSED IN  MG ELEMENTAL CALCIUM) 500 MG CHEWABLE TABLET    Chew 1 tablet by mouth as needed for indigestion or heartburn. Reported on 12/13/2015   CARVEDILOL (COREG) 12.5 MG TABLET    Take 1 tablet (12.5 mg total) by mouth 2 (two) times daily with a  meal.   COLCHICINE 0.6 MG TABLET    Take one tablet by mouth once daily for gout prevention   DICLOFENAC SODIUM (VOLTAREN) 1 % GEL    Apply 2 g topically 4 (four) times daily.   FUROSEMIDE (LASIX) 80 MG TABLET    Take 80 mg by mouth 2 (two) times daily.   GABAPENTIN (NEURONTIN) 600 MG TABLET    Take 600 mg by mouth 2 (two) times daily. Reported on 12/13/2015   GLUCOSE BLOOD (ONE TOUCH ULTRA TEST) TEST STRIP    Check blood sugar twice daily E11.8   GUAIFENESIN (MUCINEX) 600 MG 12 HR TABLET    Take 1 tablet (600 mg total) by mouth 2 (two) times daily.   INSULIN GLARGINE (LANTUS SOLOSTAR) 100 UNIT/ML SOLOSTAR PEN    Inject 10 Units into the skin daily at 10 pm. E11.8   INSULIN PEN NEEDLE 31G X 8 MM MISC    Use daily with the administration of lantus E11.8   IPRATROPIUM (ATROVENT) 0.02 % NEBULIZER SOLUTION    Take 2.5 mLs (0.5 mg total) by nebulization 2 (two) times daily.   LEVALBUTEROL (XOPENEX HFA) 45 MCG/ACT INHALER    Inhale 1-2 puffs into the lungs every 4 (four) hours as needed for wheezing. Reported on 12/13/2015   LEVALBUTEROL (XOPENEX) 0.63 MG/3ML NEBULIZER SOLUTION    Take 3 mLs (0.63 mg total) by nebulization every 4 (four) hours as needed for wheezing.   LOSARTAN (COZAAR) 25 MG TABLET    Take 12.5 mg by mouth daily. Reported on 12/13/2015   NICOTINE (NICODERM CQ - DOSED IN MG/24 HR) 7 MG/24HR PATCH    Place 1 patch (7 mg total) onto the skin daily.   OXYCODONE (OXY IR/ROXICODONE) 5 MG IMMEDIATE RELEASE TABLET    Take 1-2 tablets (5-10 mg total) by mouth every 4 (four) hours as needed for moderate pain, severe pain or breakthrough pain.   POTASSIUM CHLORIDE ER 20 MEQ TBCR    Take 20 mEq by mouth 2 (two) times daily.   SILDENAFIL (VIAGRA) 100 MG TABLET    Take 100 mg by mouth daily as needed for erectile dysfunction. Reported on 12/13/2015   XARELTO 20 MG TABS TABLET    TAKE 1 TABLET (20 MG TOTAL) BY MOUTH DAILY WITH SUPPER.  Modified Medications   No medications on file  Discontinued  Medications   No medications on file    Review of Systems  Unable to perform ROS: Other    Filed Vitals:   01/04/16 1423  BP: 120/78  Pulse: 95  Temp: 97.6 F (36.4 C)  TempSrc: Oral  Resp: 20  Height: _0  (1.702 m)  Weight: 269 lb 3.2 oz (122.108 kg)  SpO2: 95%   Body mass index is 42.15 kg/(m^2).  Physical Exam  Constitutional: He appears well-developed and well-nourished.  HENT:  Mouth/Throat: Oropharynx is clear and moist. No oropharyngeal exudate.  Right TM increased cerumen in external ear canal. Left TM appears nml  Eyes: Pupils are equal, round, and reactive to light. No scleral icterus.  Neck: Neck supple.  Cardiovascular: An irregularly irregular rhythm present.  Murmur heard.  Systolic murmur is present with a grade of 1/6  +1 pitting LE edema.  No calf TTP  Pulmonary/Chest: Effort normal and breath sounds normal. He has no wheezes. He has no rhonchi. He has no rales. He exhibits tenderness (left lateral rib 7-8 stuck up and TTP).  Musculoskeletal: He exhibits edema and tenderness.  Lymphadenopathy:    He has no cervical adenopathy.  Neurological: He is alert. He is disoriented.  Skin: Skin is warm and dry. No rash noted.  Psychiatric: He has a normal mood and affect. His behavior is normal.     Labs reviewed: Office Visit on 01/03/2016  Component Date Value Ref Range Status  . Sodium 01/03/2016 126* 135 - 146 mmol/L Final  . Potassium 01/03/2016 3.5  3.5 - 5.3 mmol/L Final  . Chloride 01/03/2016 82* 98 - 110 mmol/L Final  . CO2 01/03/2016 30  20 - 31 mmol/L Final  . Glucose, Bld 01/03/2016 278* 65 - 99 mg/dL Final  . BUN 01/03/2016 47* 7 - 25 mg/dL Final  . Creat 01/03/2016 1.43* 0.70 - 1.33 mg/dL Final  . Calcium 01/03/2016 9.1  8.6 - 10.3 mg/dL Final  Office Visit on 12/26/2015  Component Date Value Ref Range Status  . Glucose 12/26/2015 247* 65 - 99 mg/dL Final  . BUN 12/26/2015 56* 6 - 24 mg/dL Final  . Creatinine, Ser 12/26/2015 1.48* 0.76 -  1.27 mg/dL Final  . GFR calc non Af Amer 12/26/2015 52* >59 mL/min/1.73 Final  . GFR calc Af Amer 12/26/2015 60  >59 mL/min/1.73 Final  . BUN/Creatinine Ratio 12/26/2015 38* 9 - 20 Final  . Sodium 12/26/2015 131* 134 - 144 mmol/L Final  . Potassium 12/26/2015 3.9  3.5 - 5.2 mmol/L Final  . Chloride 12/26/2015 82* 96 - 106 mmol/L Final  . CO2 12/26/2015 25  18 - 29 mmol/L Final  . Calcium 12/26/2015 10.1  8.7 - 10.2 mg/dL Final  . Total Protein 12/26/2015 7.5  6.0 - 8.5 g/dL Final  . Albumin 12/26/2015 4.1  3.5 - 5.5 g/dL Final  . Globulin, Total 12/26/2015 3.4  1.5 - 4.5 g/dL Final  . Albumin/Globulin Ratio 12/26/2015 1.2  1.1 - 2.5 Final  . Bilirubin Total 12/26/2015 1.0  0.0 - 1.2 mg/dL Final  . Alkaline Phosphatase 12/26/2015 112  39 - 117 IU/L Final  . AST 12/26/2015 52* 0 - 40 IU/L Final  . ALT 12/26/2015 50* 0 - 44 IU/L Final  . Cholesterol, Total 12/26/2015 197  100 - 199 mg/dL Final  . Triglycerides 12/26/2015 364* 0 - 149 mg/dL Final  . HDL 12/26/2015 30* >39 mg/dL Final  . VLDL Cholesterol Cal 12/26/2015 73* 5 - 40 mg/dL Final  . LDL Calculated 12/26/2015 94  0 - 99 mg/dL Final  . Chol/HDL Ratio 12/26/2015 6.6* 0.0 - 5.0 ratio units Final   Comment:                                   T. Chol/HDL Ratio                                             Men  Women                               1/2 Avg.Risk  3.4    3.3  Avg.Risk  5.0    4.4                                2X Avg.Risk  9.6    7.1                                3X Avg.Risk 23.4   11.0   . Hgb A1c MFr Bld 12/26/2015 8.0* 4.8 - 5.6 % Final   Comment:          Pre-diabetes: 5.7 - 6.4          Diabetes: >6.4          Glycemic control for adults with diabetes: <7.0   . Est. average glucose Bld gHb Est-m* 12/26/2015 183   Final  Admission on 12/13/2015, Discharged on 12/21/2015  No results displayed because visit has over 200 results.    Admission on 12/13/2015, Discharged on 12/13/2015    Component Date Value Ref Range Status  . Sodium 12/13/2015 136  135 - 145 mmol/L Final  . Potassium 12/13/2015 3.5  3.5 - 5.1 mmol/L Final  . Chloride 12/13/2015 101  101 - 111 mmol/L Final  . CO2 12/13/2015 24  22 - 32 mmol/L Final  . Glucose, Bld 12/13/2015 107* 65 - 99 mg/dL Final  . BUN 12/13/2015 13  6 - 20 mg/dL Final  . Creatinine, Ser 12/13/2015 0.95  0.61 - 1.24 mg/dL Final  . Calcium 12/13/2015 8.7* 8.9 - 10.3 mg/dL Final  . Total Protein 12/13/2015 6.6  6.5 - 8.1 g/dL Final  . Albumin 12/13/2015 2.9* 3.5 - 5.0 g/dL Final  . AST 12/13/2015 49* 15 - 41 U/L Final  . ALT 12/13/2015 20  17 - 63 U/L Final  . Alkaline Phosphatase 12/13/2015 60  38 - 126 U/L Final  . Total Bilirubin 12/13/2015 1.2  0.3 - 1.2 mg/dL Final  . GFR calc non Af Amer 12/13/2015 >60  >60 mL/min Final  . GFR calc Af Amer 12/13/2015 >60  >60 mL/min Final   Comment: (NOTE) The eGFR has been calculated using the CKD EPI equation. This calculation has not been validated in all clinical situations. eGFR's persistently <60 mL/min signify possible Chronic Kidney Disease.   . Anion gap 12/13/2015 11  5 - 15 Final  . WBC 12/13/2015 4.3  4.0 - 10.5 K/uL Final  . RBC 12/13/2015 4.25  4.22 - 5.81 MIL/uL Final  . Hemoglobin 12/13/2015 12.4* 13.0 - 17.0 g/dL Final  . HCT 12/13/2015 39.2  39.0 - 52.0 % Final  . MCV 12/13/2015 92.2  78.0 - 100.0 fL Final  . MCH 12/13/2015 29.2  26.0 - 34.0 pg Final  . MCHC 12/13/2015 31.6  30.0 - 36.0 g/dL Final  . RDW 12/13/2015 15.1  11.5 - 15.5 % Final  . Platelets 12/13/2015 217  150 - 400 K/uL Final    Dg Chest 2 View  12/13/2015  CLINICAL DATA:  Cough for 5 days. Chest pain. Shortness of breath. COPD. EXAM: CHEST  2 VIEW COMPARISON:  12/13/2015 at 12:33 p.m. FINDINGS: Stable enlargement of the cardiopericardial silhouette with bilateral interstitial accentuation similar to today's earlier exam. Thoracic spondylosis. IMPRESSION: 1. Bilateral primarily interstitial accentuation  similar to prior, favoring interstitial edema or atypical pneumonia. 2. Stable enlargement of the cardiopericardial silhouette. Electronically Signed   By: Van Clines M.D.   On: 12/13/2015  19:08   Dg Chest 2 View  12/13/2015  CLINICAL DATA:  Productive cough.  Chest pain. EXAM: CHEST  2 VIEW COMPARISON:  07/23/2015 FINDINGS: Moderate enlargement of the cardiopericardial silhouette with bilateral abnormal interstitial accentuation favoring the mid lungs and lung bases. Tortuous thoracic aorta. No pleural effusion. Mild thoracic spondylosis. IMPRESSION: 1. Bilateral abnormal increased perihilar and basilar interstitial accentuation with underlying chronic moderate enlargement of the cardiopericardial silhouette. Appearance could be due to interstitial edema or atypical pneumonia. Electronically Signed   By: Van Clines M.D.   On: 12/13/2015 13:02   Ct Angio Chest Pe W/cm &/or Wo Cm  12/13/2015  CLINICAL DATA:  Acute onset of cough and shortness of breath. Extremity swelling. Generalized weakness and insomnia. Initial encounter. EXAM: CT ANGIOGRAPHY CHEST WITH CONTRAST TECHNIQUE: Multidetector CT imaging of the chest was performed using the standard protocol during bolus administration of intravenous contrast. Multiplanar CT image reconstructions and MIPs were obtained to evaluate the vascular anatomy. CONTRAST:  131m OMNIPAQUE IOHEXOL 350 MG/ML SOLN COMPARISON:  CT of the chest performed 01/03/2015, and chest radiograph performed earlier today at 6:53 p.m. FINDINGS: There is no evidence of significant pulmonary embolus. Vascular congestion is noted, with bilateral central ground-glass airspace opacities. This most likely reflects pulmonary edema, though infection could have a similar appearance. There is no evidence of pleural effusion or pneumothorax. No masses are identified; no abnormal focal contrast enhancement is seen. The heart is enlarged. Enlarged mediastinal nodes are seen, measuring up  to 1.9 cm in short axis, in the right paratracheal, periaortic, precarinal and subcarinal regions, and at the azygoesophageal recess. Incidental note is made of a retroesophageal right subclavian artery. The great vessels are grossly unremarkable in appearance. No pericardial effusion is identified. No axillary lymphadenopathy is seen. The visualized portions of the thyroid gland are unremarkable in appearance. There is also a 2.2 cm epicardial fat pad node, located relatively superiorly. The visualized portions of the liver and spleen are unremarkable. Perinephric stranding is noted bilaterally. The visualized portions of the pancreas and adrenal glands are within normal limits. No acute osseous abnormalities are seen. Review of the MIP images confirms the above findings. IMPRESSION: 1. No evidence of significant pulmonary embolus. 2. Vascular congestion and cardiomegaly, with bilateral central ground-glass airspace opacities. This most likely reflects pulmonary edema, though infection could have a similar appearance. 3. Enlarged mediastinal nodes are more prominent than on the prior study, measuring up to 1.9 cm in short axis. Some of these would be amenable to biopsy, as deemed clinically appropriate. Relatively superior 2.2 cm epicardial fat pad node also noted. Electronically Signed   By: JGarald BaldingM.D.   On: 12/13/2015 23:37     Assessment/Plan   ICD-9-CM ICD-10-CM   1. Hyponatremia 276.1 E87.1   2. Unsteady gait 781.2 R26.81   3. Tinnitus, bilateral 388.30 H93.13   4. Chronic systolic heart failure (HCC) 428.22 I50.22   5. Rib sprain, initial encounter 848.3 S23.41XA    STOP ZAROXOLYN (METALOZONE)   CONTINUE TAKING LASIX (FUROSEMIDE)  Change positions slowly to avoid dizziness  Continue other medications as ordered  Continue fluid restriction (1500 ml daily)  Follow up with cardiology for repeat labs in 2 weeks  May apply warm compress as needed to left rib for pain  Follow up  for routine visit as scheduled  Valeen Borys S. CPerlie Gold PSelect Specialty Hospital - North Knoxvilleand Adult Medicine [address]GHumble  [phone] (Monday-Friday 8  AM - 5 PM) [phone] After 5 PM and follow prompts  

## 2016-01-04 NOTE — Telephone Encounter (Signed)
Advised patient of lab results and verbalized understanding Scheduled follow up labs

## 2016-01-07 ENCOUNTER — Telehealth: Payer: Self-pay | Admitting: *Deleted

## 2016-01-07 ENCOUNTER — Other Ambulatory Visit: Payer: Self-pay

## 2016-01-07 MED ORDER — OXYCODONE HCL 5 MG PO TABS: 5.0000 mg | ORAL_TABLET | ORAL | Status: DC | PRN

## 2016-01-07 NOTE — Telephone Encounter (Signed)
Printed and left up front for pick up. Patient notified.

## 2016-01-07 NOTE — Telephone Encounter (Signed)
Patient is calling wanting a refill on his Oxycodone due to rib pain. Received #20 on 12/21/15 from Dr. Thad Ranger. (Narcotic Contract On File) Please Advise.

## 2016-01-07 NOTE — Telephone Encounter (Signed)
Patient came in to pick up Rx and just wanted to let you know that he thinks he hurt his ribs on the other side now.

## 2016-01-07 NOTE — Telephone Encounter (Signed)
Ok for 30 tabs.  

## 2016-01-08 NOTE — Patient Outreach (Signed)
Successful contact made with patient to discuss need for community care coordination and to schedule home visit for next week.  Patient states he understands he has heart failure, stated he does not have a scale. Referral made to [facility]'s Heart Failure Program and request made for telemonitoring system for daily weights.    Patient and this RNCM collaborated to form this initial case management care plan. Care plan focus will be on educating patient for better heart Failure maintenance.   Patient agreed on home visit next week.  Plan: Home visit on February 21.

## 2016-01-09 ENCOUNTER — Ambulatory Visit: Payer: PPO

## 2016-01-10 ENCOUNTER — Telehealth: Payer: Self-pay | Admitting: Cardiology

## 2016-01-10 NOTE — Telephone Encounter (Signed)
Spoke with patient and he denies any swelling or shortness of breath No change in diet except he may have had more fluid intake than he usual  Will forward to  Dr. Patty Sermons for review

## 2016-01-10 NOTE — Telephone Encounter (Signed)
New message      Pt has gained 3 lbs overnight.  No sob or swelling in ankles or legs

## 2016-01-10 NOTE — Telephone Encounter (Signed)
Advised patient, verbalized understanding  

## 2016-01-10 NOTE — Telephone Encounter (Signed)
He should take an extra furosemide tonight and then go back to his usual dose tomorrow

## 2016-01-15 ENCOUNTER — Other Ambulatory Visit: Payer: Self-pay

## 2016-01-15 NOTE — Patient Outreach (Signed)
[facility]) Care Management  01/15/2016  Jeremiah Perkins 1961/03/07 [id number]     Hgb A1C 8.0 Changed Metformin to Lantus  CHF Walking [facility] provided scales for daily weights and calendar for recording, HF infomration. Reviewd HF Zones Action Plan  Has CPAP for Sleep Apnea.   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

## 2016-01-16 DIAGNOSIS — M109 Gout, unspecified: Secondary | ICD-10-CM | POA: Diagnosis not present

## 2016-01-16 DIAGNOSIS — Z79899 Other long term (current) drug therapy: Secondary | ICD-10-CM | POA: Diagnosis not present

## 2016-01-16 NOTE — Patient Outreach (Signed)
[facility]) Care Management  January 15, 2016   Jeremiah Perkins [facility] 09-05-1961 [id number]   Initial home visit to start Heart Failure Education, deliver scales for daily weights. Patient viewed EMMI Heart Failure bundle.  Patient states he was told he should drink no more than 1.5 liters of fluids. 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 This RNCM advised patient of the local substance abuse outpatient treatment centers in the community, and, could be accessed by calling [facility] or the [facility] for a referral.  Patient and this RNCM reviewed HF Action Plan Zones, patient  States he understands.

## 2016-01-18 ENCOUNTER — Telehealth: Payer: Self-pay | Admitting: Cardiology

## 2016-01-18 ENCOUNTER — Other Ambulatory Visit: Payer: PPO

## 2016-01-18 NOTE — Telephone Encounter (Signed)
Spoke with [employer] and Viagra in process  Patient aware

## 2016-01-18 NOTE — Telephone Encounter (Signed)
Calling about an order that needs to be faxed for his Viagara . The Pharmacy [facility]) is stating they do not have an order .  Thanks

## 2016-01-21 ENCOUNTER — Other Ambulatory Visit (INDEPENDENT_AMBULATORY_CARE_PROVIDER_SITE_OTHER): Payer: PPO | Admitting: *Deleted

## 2016-01-21 DIAGNOSIS — Z79899 Other long term (current) drug therapy: Secondary | ICD-10-CM | POA: Diagnosis not present

## 2016-01-21 LAB — BASIC METABOLIC PANEL
BUN: 38 mg/dL — ABNORMAL HIGH (ref 7–25)
CALCIUM: 9.9 mg/dL (ref 8.6–10.3)
CO2: 24 mmol/L (ref 20–31)
CREATININE: 1.32 mg/dL (ref 0.70–1.33)
Chloride: 99 mmol/L (ref 98–110)
GLUCOSE: 168 mg/dL — AB (ref 65–99)
POTASSIUM: 3.9 mmol/L (ref 3.5–5.3)
Sodium: 137 mmol/L (ref 135–146)

## 2016-01-22 NOTE — Progress Notes (Signed)
Quick Note:  Please report to patient. The recent labs are stable. Continue same medication and careful diet. Kidney function is better. ______ 

## 2016-01-24 ENCOUNTER — Telehealth: Payer: Self-pay

## 2016-01-24 NOTE — Telephone Encounter (Signed)
Shipment of Viagra 100mg  tabs received from [facility]. Informed patient that they are here for pickup. Also informed him of his recent labwork, because he had missed a call from [place].

## 2016-01-25 ENCOUNTER — Other Ambulatory Visit: Payer: Self-pay

## 2016-01-25 NOTE — Patient Outreach (Signed)
Care plan reviewed and updated with patient after he identified himself by providing date of birth and address.  Plan: Telephone contact next week for week #4 transition of care.

## 2016-01-30 ENCOUNTER — Other Ambulatory Visit: Payer: PPO

## 2016-01-30 DIAGNOSIS — Z0000 Encounter for general adult medical examination without abnormal findings: Secondary | ICD-10-CM | POA: Diagnosis not present

## 2016-01-30 DIAGNOSIS — N5201 Erectile dysfunction due to arterial insufficiency: Secondary | ICD-10-CM | POA: Diagnosis not present

## 2016-02-01 ENCOUNTER — Ambulatory Visit: Payer: PPO | Admitting: Internal Medicine

## 2016-02-07 ENCOUNTER — Other Ambulatory Visit: Payer: Self-pay

## 2016-02-07 NOTE — Patient Outreach (Signed)
Unsuccessful attempt made to contact patient via telephone for assessment of community care coordination needs. HIPPA compliant message left for patient with this RNCM's contact information.   Plan: Make another attempt to contact patient on March 17

## 2016-02-08 ENCOUNTER — Other Ambulatory Visit: Payer: Self-pay

## 2016-02-08 NOTE — Patient Outreach (Signed)
Unsuccessful attempt made to contact patient at [phone].  Plan: Make another attempt to contact patient on Monday, March 20 for community Care Coordination and to schedule home visit.

## 2016-02-18 ENCOUNTER — Other Ambulatory Visit: Payer: PPO

## 2016-02-20 ENCOUNTER — Ambulatory Visit: Payer: PPO | Admitting: Internal Medicine

## 2016-02-28 ENCOUNTER — Other Ambulatory Visit: Payer: Self-pay | Admitting: Licensed Clinical Social Worker

## 2016-02-28 DIAGNOSIS — I509 Heart failure, unspecified: Secondary | ICD-10-CM

## 2016-02-28 NOTE — Patient Outreach (Signed)
[facility]) Care Management  02/28/2016  Emirhan Sayson Buster 08-Sep-1961 [id number]   Assessment-CSW completed outreach to patient after receiving referral stating that patient's needs a list of dementia support groups. CSW completed initial outreach but was unable to reach him. HIPPA compliant voice message was left.  Plan-CSW will complete outreach by 03/04/16 if no return call has been made.  [name], BSW, MSW, LCSW [facility] [email or web address] Phone: [phone] Fax: [phone]

## 2016-02-28 NOTE — Patient Outreach (Unsigned)
[facility]) Care Management  02/28/2016  Jeremiah Perkins 29-Jan-1961 [id number]   Telephone call received from patient who expressed feeling of beign overwheilmed

## 2016-02-29 ENCOUNTER — Other Ambulatory Visit: Payer: Self-pay | Admitting: Licensed Clinical Social Worker

## 2016-02-29 NOTE — Patient Outreach (Signed)
[facility]) Care Management  02/29/2016  Jeremiah Perkins 1961/02/07 [id number]   Assessment-CSW completed outreach to patient. Patient answered. Patient provided HIPPA verifications. CSW introduced self, reason for call and of [facility] social work services. Patient shares "I just need a list of groups for dementia." Patient is agreeable to CSW mailing off resources. Patient states "Here talk to my wife. You called me at a bad time and I'm trying to handle busy. You can talk to her about what I'm needing. CSW stated that she will contact him back next week in order to discuss his needs further.  Plan-CSW will complete outreach on 03/03/16. CSW will mail off resources after that conversation.  Jeremiah Perkins, BSW, MSW, LCSW [email or web address] Phone: [phone] Fax: [phone]

## 2016-03-03 ENCOUNTER — Other Ambulatory Visit: Payer: Self-pay | Admitting: Licensed Clinical Social Worker

## 2016-03-03 NOTE — Patient Outreach (Signed)
[facility]) Care Management  03/03/2016  Jeremiah Perkins 1961-09-03 [id number]   Assessment- CSW completed outreach to patient. Patient was unable to talk to CSW last week as he was busy. CSW unable to reach patient today but left a HIPPA compliant voice message encouraging him call once available.  Plan-CSW will await return call or will complete an additional outreach by 03/06/16.  Jeremiah Perkins, BSW, MSW, LCSW [email or web address] Phone: [phone] Fax: [phone]

## 2016-03-03 NOTE — Patient Outreach (Signed)
[facility]) Care Management  03/03/2016  Haji Galluccio Elbert 06-02-1961 [id number]   Assessment-CSW received voice message from patient. CSW attempted an additional outreach to patient but could not establish contact. CSW left another HIPPA compliant voice message.  Plan-CSW will await to hear back from patient or complete an additional outreach.  [name], BSW, MSW, LCSW [email or web address] Phone: [phone] Fax: [phone]

## 2016-03-04 ENCOUNTER — Encounter: Payer: Self-pay | Admitting: Cardiovascular Disease

## 2016-03-04 ENCOUNTER — Ambulatory Visit (INDEPENDENT_AMBULATORY_CARE_PROVIDER_SITE_OTHER): Payer: PPO | Admitting: Cardiovascular Disease

## 2016-03-04 ENCOUNTER — Other Ambulatory Visit: Payer: Self-pay | Admitting: Licensed Clinical Social Worker

## 2016-03-04 VITALS — BP 92/70 | HR 90 | Ht 67.0 in | Wt 264.4 lb

## 2016-03-04 DIAGNOSIS — I5022 Chronic systolic (congestive) heart failure: Secondary | ICD-10-CM

## 2016-03-04 LAB — BASIC METABOLIC PANEL
BUN: 51 mg/dL — AB (ref 7–25)
CALCIUM: 9.2 mg/dL (ref 8.6–10.3)
CO2: 23 mmol/L (ref 20–31)
CREATININE: 1.24 mg/dL (ref 0.70–1.33)
Chloride: 93 mmol/L — ABNORMAL LOW (ref 98–110)
Glucose, Bld: 119 mg/dL — ABNORMAL HIGH (ref 65–99)
Potassium: 3.7 mmol/L (ref 3.5–5.3)
Sodium: 135 mmol/L (ref 135–146)

## 2016-03-04 LAB — BRAIN NATRIURETIC PEPTIDE: Brain Natriuretic Peptide: 127.8 pg/mL — ABNORMAL HIGH (ref <100)

## 2016-03-04 NOTE — Patient Instructions (Signed)
Medication Instructions:  Your physician recommends that you continue on your current medications as directed. Please refer to the Current Medication list given to you today.   Labwork: Bmet, Bnp  Testing/Procedures: None ordered  Follow-Up: Your physician recommends that you schedule a follow-up appointment in: 3  Months with Dr.Nahser   Any Other Special Instructions Will Be Listed Below (If Applicable).     If you need a refill on your cardiac medications before your next appointment, please call your pharmacy.

## 2016-03-04 NOTE — Patient Outreach (Addendum)
[facility]) Care Management  03/04/2016  Jeremiah Perkins Jeremiah Perkins [id number]   Assessment-CSW completed outreach to patient. Patient answered. HIPPA verifications provided. Patient shares that he needs dementia support groups for his brother who has dementia. Patient reports that he does not need any community resources but would like dementia support group information and mental health resources for his brother and not himself. Patient reports daily to every other day drinking. He has went to [employer] in the past. He denied wanting substance abuse resources. Patient denied wishing to complete advance directives. Patient denies needing any further social work resources. Patient denies needing social work services. CSW will complete social work discharge at this time.  Plan-CSW will send in basket request to [facility] Care Management Assistant to mail out resources. CSW will update RNCM on discharge.  Jeremiah Perkins, BSW, MSW, LCSW [name] Phone: [phone] Fax: [phone]

## 2016-03-04 NOTE — Progress Notes (Signed)
Cardiology Office Note   Date:  03/04/2016   ID:  Jeremiah Perkins, DOB 10-Oct-1961, MRN [id number]  PCP:  Kirt Boys, DO  Cardiologist:  Transfer from Cassell Clement MD, now Ransome Helwig  Problem list 1. Chronic systolic congestive heart failure 2. Paroxysmal atrial fibrillation 3. Diabetes mellitus 4. Hypertension 5. COPD  Chief Complaint  Patient presents with  . Follow-up    CHF    History of Present Illness: Jeremiah Perkins is a 55 y.o. male who presents for Scheduled follow-up visit.  The patient was recently hospitalized from 12/13/15 until 12/21/15 for acute respiratory insufficiency, community-acquired pneumonia, and exacerbation of acute on chronic systolic heart failure. He had a past history of paroxysmal atrial fibrillation. The patient underwent elective outpatient cardioversion on 10/26/13. Marland Kitchen He has a history of a nonischemic cardiomyopathy with previous catheterization showing no significant obstructive disease. His ejection fraction is estimated at 20-25%. He is diuresing on his current outpatient regimen. He still notes significant dyspnea with minimal exertion. Marland Kitchen He does have occasional chest discomfort. The patient also has a history of diabetes mellitus type 2 which is followed at the internal medicine clinic. He has a history of obstructive sleep apnea and morbid obesity and diabetic peripheral neuropathy. He has a past history of hypertension and gout. He has a past history of peripheral neuropathy and is on gabapentin The patient has finished the the cardiac rehabilitation program. The patient reiterated today that he is not interested in an ICD or a pacemaker. The patient states that his breathing has improved since hospitalization.  He is not coughing up any sputum.  His most recent echocardiogram on 12/14/15 showed an ejection fraction of 35-40%.  While in the hospital he was diuresed aggressively and was sent home on furosemide 160 mg twice a  day and Zaroxolyn 2.5 mg daily.  He is no longer on torsemide.  Lab work on February 3 showed increased BUN and creatinine and is diuretics were reduced.  He is now on Lasix 80 mg twice a day and metolazone 2.5 mg every other day Kirt Boys at [facility] is his PCP  March 04, 2016:  Feels a little weak today  BP is 84/64 No CP  Has allergies which have been acting up recently .  No specific dizziness but does describe orthostasis.   Able to do normal household activities. Gets very short of breath walking up stairs.  He can walk a mile without stopping  But has to take his time .   Has been trying to lose weight for a long time.       Past Medical History  Diagnosis Date  . Hypertension   . Congestive heart failure (HCC)     No echo data available. Myocardial perfusion 2005 with EF 61%, presumed diastolic HF   . Diverticulosis     with history of diverticulitis in 10/2011  . Major depression (HCC)   . Anxiety   . Psychosexual dysfunction with inhibited sexual excitement   . Hyperlipidemia   . Atrial flutter (HCC)   . Morbid obesity (HCC)   . Chronic bronchitis (HCC)     "get it q yr" (01/02/2015)  . Shortness of breath     "all the time" (07/26/2013)  . Chronic back pain   . GERD (gastroesophageal reflux disease)   . Asthma   . Pneumonia 2013; 2014  . OSA (obstructive sleep apnea) 1998    "wear my CPAP a couple times/month" (01/02/2015)  .  Diabetic peripheral neuropathy (HCC)   . Type II diabetes mellitus (HCC) dx'd 1991    previously on insulin in 2000 for 3-4 years (but was stopped because adamantly did not want to be on insulin)  . Arthritis     involving knees, ankles, back, elbows (01/02/2015)  . Gout   . Myocardial infarction [facility])     "one doctor said I did back in 2014"  . Cholelithiasis   . COPD (chronic obstructive pulmonary disease) [facility])     Past Surgical History  Procedure Laterality Date  . Ankle reconstruction Right 1990's    2/2 trauma  sustained after fall  . 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 Cyst removal leg Left 1960's    back of  leg  . Tee without cardioversion N/A 07/28/2013    Procedure: TRANSESOPHAGEAL ECHOCARDIOGRAM (TEE);  Surgeon: Laurey Morale, MD;  Location: [facility] ENDOSCOPY;  Service: Cardiovascular;  Laterality: N/A;  . Cardioversion N/A 07/28/2013    Procedure: CARDIOVERSION;  Surgeon: Laurey Morale, MD;  Location: [facility] ENDOSCOPY;  Service: Cardiovascular;  Laterality: N/A;  . Cardioversion N/A 10/26/2013    Procedure: CARDIOVERSION;  Surgeon: Cassell Clement, MD;  Location: [facility] ENDOSCOPY;  Service: Cardiovascular;  Laterality: N/A;  . Left heart catheterization with coronary angiogram N/A 07/27/2013    Procedure: LEFT HEART CATHETERIZATION WITH CORONARY ANGIOGRAM;  Surgeon: Peter M Swaziland, MD;  Location: [facility] CATH LAB;  Service: Cardiovascular;  Laterality: N/A;  . Cardiac catheterization  1990's; 07/2013  . Finger amputation    . Cholecystectomy N/A 08/24/2015    Procedure: LAPAROSCOPIC CHOLECYSTECTOMY WITH INTRAOPERATIVE CHOLANGIOGRAM;  Surgeon: Avel Peace, MD;  Location: [facility] ORS;  Service: General;  Laterality: N/A;     Current Outpatient Prescriptions  Medication Sig Dispense Refill  . allopurinol (ZYLOPRIM) 100 MG tablet Take 1 tablet (100 mg total) by mouth daily. 30 tablet 3  . budesonide (PULMICORT) 0.5 MG/2ML nebulizer solution Take 4 mLs (1 mg total) by nebulization 2 (two) times daily. 60 mL 12  . buPROPion (WELLBUTRIN SR) 150 MG 12 hr tablet Take 150 mg by mouth 2 (two) times daily.    . calcium carbonate (TUMS - DOSED IN MG ELEMENTAL CALCIUM) 500 MG chewable tablet Chew 1 tablet by mouth as needed for indigestion or heartburn. Reported on 01/15/2016    . carvedilol (COREG) 12.5 MG tablet Take 1 tablet (12.5 mg total) by mouth 2 (two) times daily with a meal. 60 tablet 3  . colchicine 0.6 MG tablet Take one tablet by mouth once daily for gout prevention 30 tablet 3  . dextromethorphan-guaiFENesin  (MUCINEX DM) 30-600 MG 12hr tablet Take 1 tablet by mouth 2 (two) times daily as needed for cough.    . diclofenac sodium (VOLTAREN) 1 % GEL Apply 2 g topically 4 (four) times daily. 100 g 2  . furosemide (LASIX) 80 MG tablet Take 80 mg by mouth 2 (two) times daily.    Marland Kitchen gabapentin (NEURONTIN) 600 MG tablet Take 600 mg by mouth 2 (two) times daily. Reported on 12/13/2015    . glucose blood (ONE TOUCH ULTRA TEST) test strip Check blood sugar twice daily E11.8    . Insulin Glargine (LANTUS SOLOSTAR) 100 UNIT/ML Solostar Pen Inject 10 Units into the skin daily at 10 pm. E11.8 15 mL 3  . Insulin Pen Needle 31G X 8 MM MISC Use daily with the administration of lantus E11.8 100 each 1  . ipratropium (ATROVENT) 0.02 % nebulizer solution Take 2.5  mLs (0.5 mg total) by nebulization 2 (two) times daily. 75 mL 12  . levalbuterol (XOPENEX HFA) 45 MCG/ACT inhaler Inhale 1-2 puffs into the lungs every 4 (four) hours as needed for wheezing. Reported on 12/13/2015    . levalbuterol (XOPENEX) 0.63 MG/3ML nebulizer solution Take 3 mLs (0.63 mg total) by nebulization every 4 (four) hours as needed for wheezing. 60 mL 12  . losartan (COZAAR) 25 MG tablet Take 12.5 mg by mouth daily. Reported on 12/13/2015    . metolazone (ZAROXOLYN) 2.5 MG tablet Take 2.5 mg by mouth 2 (two) times a week.    . Potassium Chloride ER 20 MEQ TBCR Take 20 mEq by mouth 2 (two) times daily. 60 tablet 5  . sildenafil (VIAGRA) 100 MG tablet Take 100 mg by mouth daily as needed for erectile dysfunction. Reported on 12/13/2015    . XARELTO 20 MG TABS tablet TAKE 1 TABLET (20 MG TOTAL) BY MOUTH DAILY WITH SUPPER. 30 tablet 11  . nicotine (NICODERM CQ - DOSED IN MG/24 HR) 7 mg/24hr patch Place 1 patch (7 mg total) onto the skin daily. (Patient not taking: Reported on 01/15/2016) 28 patch 0  . oxyCODONE (OXY IR/ROXICODONE) 5 MG immediate release tablet Take 1-2 tablets (5-10 mg total) by mouth every 4 (four) hours as needed for moderate pain, severe pain  or breakthrough pain. (Patient not taking: Reported on 03/04/2016) 30 tablet 0   No current facility-administered medications for this visit.    Allergies:   Review of patient's allergies indicates no known allergies.    Social History:  The patient  reports that he quit smoking about 5 months ago. His smoking use included Cigarettes. He has a 1.2 pack-year smoking history. He has never used smokeless tobacco. He reports that he drinks alcohol. He reports that he does not use illicit drugs.   Family History:  The patient's family history includes Alzheimer's disease in his maternal grandmother and mother; Breast cancer in his paternal aunt; CAD (age of onset: 45) in his brother; CAD (age of onset: 65) in his mother; Diabetes in his maternal aunt, maternal grandmother, and mother; Gout in his father; Hypertension in his brother and mother; Lung cancer in his father; Seizures in his brother and paternal uncle; Stomach cancer in his father.    ROS:  Please see the history of present illness.   Otherwise, review of systems are positive for none.   All other systems are reviewed and negative.    PHYSICAL EXAM: VS:  BP 92/70 mmHg  Pulse 90  Ht 5\' 7"  (1.702 m)  Wt 264 lb 6.4 oz (119.931 kg)  BMI 41.40 kg/m2 , BMI Body mass index is 41.4 kg/(m^2). GEN: Well nourished, well developed, in no acute distress HEENT: normal Neck: no JVD, carotid bruits, or masses Cardiac: Irregularly irregular; no murmurs, rubs, or gallops,no edema  Respiratory:  clear to auscultation bilaterally, normal work of breathing GI: soft, nontender, nondistended, + BS MS: no deformity or atrophy Skin: warm and dry, no rash Neuro:  Strength and sensation are intact Psych: euthymic mood, full affect   EKG:  EKG is ordered today. The ekg ordered today demonstrates Atrial fibrillation with rapid ventricular response 112 bpm.  Occasional PVCs.  Low voltage QRS.   Recent Labs: 12/13/2015: B Natriuretic Peptide  197.5* 12/20/2015: Hemoglobin 14.2; Platelets 414* 12/26/2015: ALT 50* 01/21/2016: BUN 38*; Creat 1.32; Potassium 3.9; Sodium 137    Lipid Panel    Component Value Date/Time   CHOL 197 12/26/2015  1541   CHOL 186 01/04/2015 0518   TRIG 364* 12/26/2015 1541   HDL 30* 12/26/2015 1541   HDL 33* 01/04/2015 0518   CHOLHDL 6.6* 12/26/2015 1541   CHOLHDL 5.6 01/04/2015 0518   VLDL 40 01/04/2015 0518   LDLCALC 94 12/26/2015 1541   LDLCALC 113* 01/04/2015 0518      Wt Readings from Last 3 Encounters:  03/04/16 264 lb 6.4 oz (119.931 kg)  01/04/16 269 lb 3.2 oz (122.108 kg)  01/03/16 267 lb 1.9 oz (121.165 kg)        ASSESSMENT AND PLAN:  1. Persistent atrial flutter fibrillation, on xarelto 2. Chronic systolic heart failureWith recent exacerbation. Will get BMP and BNP to assess his CHF 3. Diabetic peripheral neuropathy 4. Morbid obesity - advised him to lose weight . 5. Erectile dysfunction. - has seen urology .   Did not like the options they presented. Wants to keep going with viagra   6.Acute on chronic kidney disease We are repeating a BMET today.  Current medicines are reviewed at length with the patient today.  The patient does not have concerns regarding medicines.  The following changes have been made:  no change  Labs/ tests ordered today include:   Orders Placed This Encounter  Procedures  . Basic metabolic panel  . B Nat Peptide       Lekendrick Alpern, Deloris Ping, MD  03/04/2016 12:36 PM    [facility] [address] [place],  [address] [place], [place]  [address] Pager [phone] Phone: [phone]; Fax: [phone]

## 2016-03-05 ENCOUNTER — Encounter: Payer: Self-pay | Admitting: Internal Medicine

## 2016-03-05 ENCOUNTER — Telehealth: Payer: Self-pay

## 2016-03-05 ENCOUNTER — Encounter: Payer: Self-pay | Admitting: Licensed Clinical Social Worker

## 2016-03-05 NOTE — Patient Outreach (Signed)
[facility]) Care Management  03/05/2016  Jeremiah Perkins [birth year] [id number]   Request received from [name], LCSW to mail patient dementia support groups and mental health resources. Information mailed today, 03/05/16

## 2016-03-05 NOTE — Telephone Encounter (Signed)
Changed patient's dosage of lasix in his medication list.

## 2016-03-05 NOTE — Telephone Encounter (Signed)
-----   Message from Vesta Mixer, MD sent at 03/05/2016 11:47 AM EDT ----- He was hypotensive at his office visit and his BUN suggests that he is volume depleted.  Lets decrease his lasix to 80 QD ( from 80 BID)  May continue zaroxolyn 2.5 twice a week for now. We may need to decrease the zaroxolyn also

## 2016-03-10 ENCOUNTER — Other Ambulatory Visit: Payer: Self-pay

## 2016-03-12 ENCOUNTER — Other Ambulatory Visit: Payer: Self-pay | Admitting: *Deleted

## 2016-03-12 MED ORDER — CARVEDILOL 12.5 MG PO TABS: 12.5000 mg | ORAL_TABLET | Freq: Two times a day (BID) | ORAL | Status: DC

## 2016-03-26 DIAGNOSIS — M7532 Calcific tendinitis of left shoulder: Secondary | ICD-10-CM | POA: Diagnosis not present

## 2016-03-26 DIAGNOSIS — M25522 Pain in left elbow: Secondary | ICD-10-CM | POA: Diagnosis not present

## 2016-03-26 DIAGNOSIS — E119 Type 2 diabetes mellitus without complications: Secondary | ICD-10-CM | POA: Diagnosis not present

## 2016-03-26 DIAGNOSIS — M79671 Pain in right foot: Secondary | ICD-10-CM | POA: Diagnosis not present

## 2016-03-26 DIAGNOSIS — M79672 Pain in left foot: Secondary | ICD-10-CM | POA: Diagnosis not present

## 2016-03-26 DIAGNOSIS — N189 Chronic kidney disease, unspecified: Secondary | ICD-10-CM | POA: Diagnosis not present

## 2016-03-26 DIAGNOSIS — M109 Gout, unspecified: Secondary | ICD-10-CM | POA: Diagnosis not present

## 2016-03-27 ENCOUNTER — Telehealth: Payer: Self-pay | Admitting: *Deleted

## 2016-03-27 ENCOUNTER — Other Ambulatory Visit: Payer: Self-pay | Admitting: *Deleted

## 2016-03-27 MED ORDER — CARVEDILOL 12.5 MG PO TABS: 12.5000 mg | ORAL_TABLET | Freq: Two times a day (BID) | ORAL | Status: AC

## 2016-03-27 NOTE — Telephone Encounter (Signed)
Patient called for a refill on carvedilol and also he asked that I reorder his viagra through the patient assistance program.  [employer] reorder for viagra  Reorder phone [phone]  Patient id # [phone]              Order [phone] and it will arrive at the office in 7-10 business days.

## 2016-04-02 ENCOUNTER — Other Ambulatory Visit: Payer: Self-pay

## 2016-04-03 ENCOUNTER — Other Ambulatory Visit: Payer: Self-pay

## 2016-04-03 DIAGNOSIS — E118 Type 2 diabetes mellitus with unspecified complications: Secondary | ICD-10-CM

## 2016-04-04 ENCOUNTER — Ambulatory Visit: Payer: Self-pay | Admitting: Internal Medicine

## 2016-04-04 ENCOUNTER — Telehealth: Payer: Self-pay

## 2016-04-04 ENCOUNTER — Encounter: Payer: Self-pay | Admitting: Internal Medicine

## 2016-04-04 ENCOUNTER — Ambulatory Visit (INDEPENDENT_AMBULATORY_CARE_PROVIDER_SITE_OTHER): Payer: PPO | Admitting: Internal Medicine

## 2016-04-04 VITALS — BP 110/70 | HR 114 | Temp 97.4°F | Resp 22 | Ht 67.0 in | Wt 266.4 lb

## 2016-04-04 DIAGNOSIS — Z794 Long term (current) use of insulin: Secondary | ICD-10-CM

## 2016-04-04 DIAGNOSIS — I5022 Chronic systolic (congestive) heart failure: Secondary | ICD-10-CM | POA: Diagnosis not present

## 2016-04-04 DIAGNOSIS — I10 Essential (primary) hypertension: Secondary | ICD-10-CM

## 2016-04-04 DIAGNOSIS — R2681 Unsteadiness on feet: Secondary | ICD-10-CM | POA: Diagnosis not present

## 2016-04-04 DIAGNOSIS — J4530 Mild persistent asthma, uncomplicated: Secondary | ICD-10-CM

## 2016-04-04 DIAGNOSIS — E118 Type 2 diabetes mellitus with unspecified complications: Secondary | ICD-10-CM | POA: Diagnosis not present

## 2016-04-04 DIAGNOSIS — M1A00X0 Idiopathic chronic gout, unspecified site, without tophus (tophi): Secondary | ICD-10-CM

## 2016-04-04 DIAGNOSIS — G894 Chronic pain syndrome: Secondary | ICD-10-CM

## 2016-04-04 DIAGNOSIS — I4892 Unspecified atrial flutter: Secondary | ICD-10-CM | POA: Diagnosis not present

## 2016-04-04 MED ORDER — OXYCODONE HCL 5 MG PO TABS: 5.0000 mg | ORAL_TABLET | ORAL | Status: DC | PRN

## 2016-04-04 MED ORDER — INSULIN GLARGINE 100 UNIT/ML SOLOSTAR PEN: 10.0000 [IU] | PEN_INJECTOR | Freq: Every day | SUBCUTANEOUS | Status: DC

## 2016-04-04 MED ORDER — DICLOFENAC SODIUM 1 % TD GEL: 2.0000 g | Freq: Four times a day (QID) | TRANSDERMAL | Status: DC

## 2016-04-04 MED ORDER — BUPROPION HCL ER (SR) 150 MG PO TB12: 150.0000 mg | ORAL_TABLET | Freq: Two times a day (BID) | ORAL | Status: AC

## 2016-04-04 MED ORDER — INSULIN PEN NEEDLE 31G X 8 MM MISC: Status: AC

## 2016-04-04 NOTE — Telephone Encounter (Signed)
Patient notified that his Viagra 100mg  had arrived. He will pick this up today.

## 2016-04-04 NOTE — Progress Notes (Signed)
Patient ID: Jeremiah Perkins, male   DOB: 25-Feb-1961, 55 y.o.   MRN: [id number]    Location:    [facility]   Place of Service:   OFFICE  Chief Complaint  Patient presents with  . Medical Management of Chronic Issues    pain in hip and back , flairs with gout    HPI:  55 yo male seen today for f/u. He continues to have chronic joint pain. He has seasonal allergy sx's - cough, wheezing, post nasal drip, rhinorrhea. He has not had labs drawn yet  HTN/CHF/aflutter - rate controlled on coreg. Takes losartan for BP control. Followed by cardio. He takes xeralto. WEIGHT 266 LBS (prev 264 lb). He is still smoking cigs but markedly reduced with bupropion. Edema controlled on lasix and 2 times per week zaroxolyn. Home weights usually 259-264 lb.  Depression/anxiety - mood stable although he does get easily frustrated with his brother who has dementia. He is his primary caregiver  Hyperlipidemia - diet controlled. LDL 94  Asthma/OSA/COPD - stable. Started on pulmicort/atroventxopenex nebs. He could not tolerate xopenex HFA  GERD - stable. He takes prn tums and zofran. He has frequent loose stools since his gallbladder surgery. He goes for BM about after meals. He has increased ibuprofen use due to running out of roxicodone  DM/neuropathy - BS at home 140- 160s, rarely 200. No low BS reactions.  Metformin stopped due to renal insufficiency (Cr 1.48 --> 1.24). He is on lantus 10 units daily. gabapentin helps neuropathy. A1c 8%  3 mos ago  Arthritis/gout - pain uncontrolled. He takes roxicodone and uses voltaren gel. Occasional gout attacks. He resumed prednisone. Allopurinol for gout maintenance. Followed by rheumatology and was seen last week - had left shoulder injection and rx prednisone taper  Past Medical History  Diagnosis Date  . Hypertension   . Congestive heart failure (HCC)     No echo data available. Myocardial perfusion 2005 with EF 61%, presumed diastolic HF   . Diverticulosis    with history of diverticulitis in 10/2011  . Major depression (HCC)   . Anxiety   . Psychosexual dysfunction with inhibited sexual excitement   . Hyperlipidemia   . Atrial flutter (HCC)   . Morbid obesity (HCC)   . Chronic bronchitis (HCC)     "get it q yr" (01/02/2015)  . Shortness of breath     "all the time" (07/26/2013)  . Chronic back pain   . GERD (gastroesophageal reflux disease)   . Asthma   . Pneumonia 2013; 2014  . OSA (obstructive sleep apnea) 1998    "wear my CPAP a couple times/month" (01/02/2015)  . Diabetic peripheral neuropathy (HCC)   . Type II diabetes mellitus (HCC) dx'd 1991    previously on insulin in 2000 for 3-4 years (but was stopped because adamantly did not want to be on insulin)  . Arthritis     involving knees, ankles, back, elbows (01/02/2015)  . Gout   . Myocardial infarction [facility])     "one doctor said I did back in 2014"  . Cholelithiasis   . COPD (chronic obstructive pulmonary disease[facility]     Past Surgical History  Procedure Laterality Date  . Ankle reconstruction Right 1990's    2/2 trauma sustained after fall  . 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 Cyst removal leg Left 1960's    back of  leg  . Tee without cardioversion N/A 07/28/2013    Procedure:  TRANSESOPHAGEAL ECHOCARDIOGRAM (TEE);  Surgeon: Laurey Morale, MD;  Location: [facility] ENDOSCOPY;  Service: Cardiovascular;  Laterality: N/A;  . Cardioversion N/A 07/28/2013    Procedure: CARDIOVERSION;  Surgeon: Laurey Morale, MD;  Location: [facility] ENDOSCOPY;  Service: Cardiovascular;  Laterality: N/A;  . Cardioversion N/A 10/26/2013    Procedure: CARDIOVERSION;  Surgeon: Cassell Clement, MD;  Location: [facility] ENDOSCOPY;  Service: Cardiovascular;  Laterality: N/A;  . Left heart catheterization with coronary angiogram N/A 07/27/2013    Procedure: LEFT HEART CATHETERIZATION WITH CORONARY ANGIOGRAM;  Surgeon: Peter M Swaziland, MD;  Location: [facility] CATH LAB;  Service: Cardiovascular;  Laterality: N/A;  . Cardiac  catheterization  1990's; 07/2013  . Finger amputation    . Cholecystectomy N/A 08/24/2015    Procedure: LAPAROSCOPIC CHOLECYSTECTOMY WITH INTRAOPERATIVE CHOLANGIOGRAM;  Surgeon: Avel Peace, MD;  Location: [facility] ORS;  Service: General;  Laterality: N/A;    Patient Care Team: Kirt Boys, DO as PCP - General (Internal Medicine) Cassell Clement, MD as Consulting Physician (Cardiology) Lavone Orn, RN as [facility] Care Management  Social History   Social History  . Marital Status: Married    Spouse Name: N/A  . Number of Children: 3  . Years of Education: 14   Occupational History  . Now unemployed     Barrister's clerk at [employer]  .  [place]   Social History Main Topics  . Smoking status: Former Smoker -- 0.12 packs/day for 10 years    Types: Cigarettes    Quit date: 09/13/2015  . Smokeless tobacco: Never Used     Comment: rarely.  . Alcohol Use: 0.0 oz/week    0 Standard drinks or equivalent per week     Comment: OCCASIONAL  . Drug Use: No     Comment: LAST USED DEC 2015  . Sexual Activity: Not on file   Other Topics Concern  . 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 They each have children but none between the two of them.      Diet:      Do you drink/ eat things with caffeine?  Yes      Marital status:   Married                             What year were you married ? 2010      Do you live in a house, apartment,assistred living, condo, trailer, etc.)? House      Is it one or more stories? Yes      How many persons live in your home ? 3      Do you have any pets in your home ?(please list) Dog      Current or past profession: Aircraft Mechanic/Instructor      Do you exercise?  Yes                            Type & how often:  Golf,cut grass, walk      Do you have a living will? No      Do you have a DNR form?   No                    If not, do you want to discuss  one?       Do you have signed  POA?HPOA forms?   No              If so, please bring to your        appointment           reports that he quit smoking about 6 months ago. His smoking use included Cigarettes. He has a 1.2 pack-year smoking history. He has never used smokeless tobacco. He reports that he drinks alcohol. He reports that he does not use illicit drugs.  No Known Allergies  Medications: Patient's Medications  New Prescriptions   No medications on file  Previous Medications   ALLOPURINOL (ZYLOPRIM) 100 MG TABLET    Take 1 tablet (100 mg total) by mouth daily.   BUDESONIDE (PULMICORT) 0.5 MG/2ML NEBULIZER SOLUTION    Take 4 mLs (1 mg total) by nebulization 2 (two) times daily.   CALCIUM CARBONATE (TUMS - DOSED IN MG ELEMENTAL CALCIUM) 500 MG CHEWABLE TABLET    Chew 1 tablet by mouth as needed for indigestion or heartburn. Reported on 01/15/2016   CARVEDILOL (COREG) 12.5 MG TABLET    Take 1 tablet (12.5 mg total) by mouth 2 (two) times daily with a meal.   COLCHICINE 0.6 MG TABLET    Take one tablet by mouth once daily for gout prevention   DEXTROMETHORPHAN-GUAIFENESIN (MUCINEX DM) 30-600 MG 12HR TABLET    Take 1 tablet by mouth 2 (two) times daily as needed for cough.   FUROSEMIDE (LASIX) 80 MG TABLET    Take 80 mg by mouth daily.   GABAPENTIN (NEURONTIN) 600 MG TABLET    Take 600 mg by mouth 2 (two) times daily. Reported on 12/13/2015   GLUCOSE BLOOD (ONE TOUCH ULTRA TEST) TEST STRIP    Check blood sugar twice daily E11.8   IPRATROPIUM (ATROVENT) 0.02 % NEBULIZER SOLUTION    Take 2.5 mLs (0.5 mg total) by nebulization 2 (two) times daily.   LEVALBUTEROL (XOPENEX HFA) 45 MCG/ACT INHALER    Inhale 1-2 puffs into the lungs every 4 (four) hours as needed for wheezing. Reported on 12/13/2015   LEVALBUTEROL (XOPENEX) 0.63 MG/3ML NEBULIZER SOLUTION    Take 3 mLs (0.63 mg total) by nebulization every 4 (four) hours as needed for wheezing.   LOSARTAN (COZAAR) 25 MG TABLET    Take 12.5 mg  by mouth daily. Reported on 12/13/2015   METOLAZONE (ZAROXOLYN) 2.5 MG TABLET    Take 2.5 mg by mouth 2 (two) times a week.   NICOTINE (NICODERM CQ - DOSED IN MG/24 HR) 7 MG/24HR PATCH    Place 1 patch (7 mg total) onto the skin daily.   OXYCODONE (OXY IR/ROXICODONE) 5 MG IMMEDIATE RELEASE TABLET    Take 1-2 tablets (5-10 mg total) by mouth every 4 (four) hours as needed for moderate pain, severe pain or breakthrough pain.   POTASSIUM CHLORIDE ER 20 MEQ TBCR    Take 20 mEq by mouth 2 (two) times daily.   SILDENAFIL (VIAGRA) 100 MG TABLET    Take 100 mg by mouth daily as needed for erectile dysfunction. Reported on 12/13/2015   XARELTO 20 MG TABS TABLET    TAKE 1 TABLET (20 MG TOTAL) BY MOUTH DAILY WITH SUPPER.  Modified Medications   Modified Medication Previous Medication   BUPROPION (WELLBUTRIN SR) 150 MG 12 HR TABLET buPROPion (WELLBUTRIN SR) 150 MG 12 hr tablet      Take 1 tablet (150 mg total) by mouth 2 (two) times daily.    Take 150 mg by mouth  2 (two) times daily.   DICLOFENAC SODIUM (VOLTAREN) 1 % GEL diclofenac sodium (VOLTAREN) 1 % GEL      Apply 2 g topically 4 (four) times daily.    Apply 2 g topically 4 (four) times daily.   INSULIN GLARGINE (LANTUS SOLOSTAR) 100 UNIT/ML SOLOSTAR PEN Insulin Glargine (LANTUS SOLOSTAR) 100 UNIT/ML Solostar Pen      Inject 10 Units into the skin daily at 10 pm. E11.8    Inject 10 Units into the skin daily at 10 pm. E11.8   INSULIN PEN NEEDLE 31G X 8 MM MISC Insulin Pen Needle 31G X 8 MM MISC      Use daily with the administration of lantus E11.8    Use daily with the administration of lantus E11.8  Discontinued Medications   No medications on file    Review of Systems  Constitutional: Positive for fatigue.  Musculoskeletal: Positive for back pain, joint swelling, arthralgias, gait problem and neck pain.  Neurological: Positive for numbness.  Psychiatric/Behavioral: Positive for sleep disturbance.  All other systems reviewed and are  negative.   Filed Vitals:   04/04/16 1117  BP: 110/70  Pulse: 114  Temp: 97.4 F (36.3 C)  TempSrc: Oral  Resp: 22  Height:  (1.702 m)  Weight: 266 lb 6.4 oz (120.838 kg)  SpO2: 97%   Body mass index is 41.71 kg/(m^2).  Physical Exam  Constitutional: He appears well-developed and well-nourished.  HENT:  Mouth/Throat: Oropharynx is clear and moist. No oropharyngeal exudate.  Eyes: Pupils are equal, round, and reactive to light. No scleral icterus.  Neck: Neck supple. No thyromegaly present.  Cardiovascular: An irregularly irregular rhythm present.  Murmur heard.  Systolic murmur is present with a grade of 1/6  +1 pitting LE edema. No calf TTP  Pulmonary/Chest: Effort normal and breath sounds normal. He has no wheezes. He has no rhonchi. He has no rales. He exhibits tenderness (left lateral rib 7-8 stuck up and TTP).  Musculoskeletal: He exhibits edema and tenderness.  Small and large joint swelling  Lymphadenopathy:    He has no cervical adenopathy.  Neurological: He is alert. He is disoriented.  Skin: Skin is warm and dry. No rash noted.  Psychiatric: He has a normal mood and affect. His behavior is normal.     Labs reviewed: Office Visit on 03/04/2016  Component Date Value Ref Range Status  . Sodium 03/04/2016 135  135 - 146 mmol/L Final  . Potassium 03/04/2016 3.7  3.5 - 5.3 mmol/L Final  . Chloride 03/04/2016 93* 98 - 110 mmol/L Final  . CO2 03/04/2016 23  20 - 31 mmol/L Final  . Glucose, Bld 03/04/2016 119* 65 - 99 mg/dL Final  . BUN 40/98/1191 51* 7 - 25 mg/dL Final  . Creat 47/82/9562 1.24  0.70 - 1.33 mg/dL Final  . Calcium 13/06/6577 9.2  8.6 - 10.3 mg/dL Final  . Brain Natriuretic Peptide 03/04/2016 127.8* <100 pg/mL Final   Comment:   BNP levels increase with age in the general population with the highest values seen in individuals greater than 60 years of age. Reference: Earlyne Iba Cardiol 2002; 46:962-95.     Lab on 01/21/2016  Component Date  Value Ref Range Status  . Sodium 01/21/2016 137  135 - 146 mmol/L Final  . Potassium 01/21/2016 3.9  3.5 - 5.3 mmol/L Final  . Chloride 01/21/2016 99  98 - 110 mmol/L Final  . CO2 01/21/2016 24  20 - 31 mmol/L Final  .  Glucose, Bld 01/21/2016 168* 65 - 99 mg/dL Final  . BUN 40/98/1191 38* 7 - 25 mg/dL Final  . Creat 47/82/9562 1.32  0.70 - 1.33 mg/dL Final  . Calcium 13/06/6577 9.9  8.6 - 10.3 mg/dL Final    No results found.   Assessment/Plan   ICD-9-CM ICD-10-CM   1. Chronic pain syndrome 338.4 G89.4   2. Type 2 diabetes mellitus with complication, with long-term current use of insulin (HCC) 250.90 E11.8 Insulin Glargine (LANTUS SOLOSTAR) 100 UNIT/ML Solostar Pen   V58.67 Z79.4 Insulin Pen Needle 31G X 8 MM MISC     Ambulatory referral to Ophthalmology  3. Chronic systolic heart failure (HCC) 428.22 I50.22   4. Unsteady gait 781.2 R26.81   5. Asthma, chronic, mild persistent, uncomplicated 493.90 J45.30   6. Essential hypertension 401.9 I10   7. Idiopathic chronic gout without tophus, unspecified site 274.02 [id number].[id number]   8. Atrial flutter, unspecified 427.32 I48.92   9. Controlled type 2 diabetes mellitus with complication, without long-term current use of insulin (HCC) 250.90 E11.8 Lipid panel     Hemoglobin A1c   Recommend dementia support group for caregivers  Continue current medications as ordered  Follow up as scheduled with specialists  Follow up in 3 mos for routine visit. Fasting labsprior to appt  Deshanti Adcox S. Ancil Linsey  [facility] [address] [place], [place] [address] [phone] Cell (Monday-Friday 8 AM - 5 PM) [phone] After 5 PM and follow prompts

## 2016-04-04 NOTE — Patient Instructions (Signed)
Continue current medications as ordered  Follow up as scheduled with specialists  Follow up in 3 mos for routine visit. Fasting labsprior to appt

## 2016-04-05 LAB — LIPID PANEL
Chol/HDL Ratio: 3.6 ratio units (ref 0.0–5.0)
Cholesterol, Total: 196 mg/dL (ref 100–199)
HDL: 55 mg/dL (ref >39)
LDL Calculated: 118 mg/dL — ABNORMAL HIGH (ref 0–99)
Triglycerides: 115 mg/dL (ref 0–149)
VLDL CHOLESTEROL CAL: 23 mg/dL (ref 5–40)

## 2016-04-05 LAB — HEMOGLOBIN A1C
ESTIMATED AVERAGE GLUCOSE: 180 mg/dL
HEMOGLOBIN A1C: 7.9 % — AB (ref 4.8–5.6)

## 2016-04-07 ENCOUNTER — Telehealth: Payer: Self-pay | Admitting: *Deleted

## 2016-04-07 NOTE — Telephone Encounter (Signed)
Received fax from [facility] Rx #[phone] for patient's Oxycodone 5mg . Filled out and given to Dr. Montez Morita to review and sign. To be faxed back to Fax#: [phone] Member #:[phone]

## 2016-04-08 NOTE — Telephone Encounter (Signed)
[place] with [facility] [phone] called and stated that there were some questions regarding prior authorization. Reference #: [id number].  Tried calling and had to leave message to return call due to all representatives busy.

## 2016-04-09 ENCOUNTER — Encounter: Payer: Self-pay | Admitting: Internal Medicine

## 2016-04-09 ENCOUNTER — Ambulatory Visit
Admission: RE | Admit: 2016-04-09 | Discharge: 2016-04-09 | Disposition: A | Discharge status: Home / Self Care | Payer: PPO | Source: Ambulatory Visit | Attending: Internal Medicine | Admitting: Internal Medicine

## 2016-04-09 ENCOUNTER — Ambulatory Visit (INDEPENDENT_AMBULATORY_CARE_PROVIDER_SITE_OTHER): Payer: PPO | Admitting: Internal Medicine

## 2016-04-09 VITALS — BP 144/84 | HR 96 | Temp 101.0°F | Resp 18

## 2016-04-09 DIAGNOSIS — J189 Pneumonia, unspecified organism: Secondary | ICD-10-CM

## 2016-04-09 DIAGNOSIS — J181 Lobar pneumonia, unspecified organism: Principal | ICD-10-CM

## 2016-04-09 DIAGNOSIS — R05 Cough: Secondary | ICD-10-CM | POA: Diagnosis not present

## 2016-04-09 MED ORDER — MOXIFLOXACIN HCL 400 MG PO TABS: 400.0000 mg | ORAL_TABLET | Freq: Every day | ORAL | Status: DC

## 2016-04-09 MED ORDER — HYDROCOD POLST-CPM POLST ER 10-8 MG/5ML PO SUER: ORAL | Status: DC

## 2016-04-09 NOTE — Patient Instructions (Addendum)
Mucinex DM  or Delsym may help the congestion and cough.

## 2016-04-09 NOTE — Progress Notes (Signed)
Patient ID: Jeremiah Perkins, male   DOB: Jan 18, 1961, 55 y.o.   MRN: [id number]    Facility  [facility]    Place of Service:   OFFICE    No Known Allergies  Chief Complaint  Patient presents with  . Acute Visit    coughing, wheezing, spiting up yellow/gold mucus, chest hurts from coughing so much, not able to sleep, for 2 days. Here with wife    HPI:   Cough for 2 days with yellow sputum.  Using oxycodone for chest pain. Fever up to 101. Hurting in epigastric area due to frequent coughing.  Had cholecystectomy in Sept 2016 by Dr. Zella Richer.  Medications: Patient's Medications  New Prescriptions   No medications on file  Previous Medications   ALLOPURINOL (ZYLOPRIM) 100 MG TABLET    Take 1 tablet (100 mg total) by mouth daily.   BUDESONIDE (PULMICORT) 0.5 MG/2ML NEBULIZER SOLUTION    Take 4 mLs (1 mg total) by nebulization 2 (two) times daily.   BUPROPION (WELLBUTRIN SR) 150 MG 12 HR TABLET    Take 1 tablet (150 mg total) by mouth 2 (two) times daily.   CALCIUM CARBONATE (TUMS - DOSED IN MG ELEMENTAL CALCIUM) 500 MG CHEWABLE TABLET    Chew 1 tablet by mouth as needed for indigestion or heartburn. Reported on 01/15/2016   CARVEDILOL (COREG) 12.5 MG TABLET    Take 1 tablet (12.5 mg total) by mouth 2 (two) times daily with a meal.   COLCHICINE 0.6 MG TABLET    Take one tablet by mouth once daily for gout prevention   DICLOFENAC SODIUM (VOLTAREN) 1 % GEL    Apply 2 g topically 4 (four) times daily.   FUROSEMIDE (LASIX) 80 MG TABLET    Take 80 mg by mouth daily.   GABAPENTIN (NEURONTIN) 600 MG TABLET    Take 600 mg by mouth 2 (two) times daily. Reported on 12/13/2015   GLUCOSE BLOOD (ONE TOUCH ULTRA TEST) TEST STRIP    Check blood sugar twice daily E11.8   INSULIN GLARGINE (LANTUS SOLOSTAR) 100 UNIT/ML SOLOSTAR PEN    Inject 10 Units into the skin daily at 10 pm. E11.8   INSULIN PEN NEEDLE 31G X 8 MM MISC    Use daily with the administration of lantus E11.8   IPRATROPIUM (ATROVENT) 0.02 %  NEBULIZER SOLUTION    Take 2.5 mLs (0.5 mg total) by nebulization 2 (two) times daily.   LEVALBUTEROL (XOPENEX HFA) 45 MCG/ACT INHALER    Inhale 1-2 puffs into the lungs every 4 (four) hours as needed for wheezing. Reported on 12/13/2015   LEVALBUTEROL (XOPENEX) 0.63 MG/3ML NEBULIZER SOLUTION    Take 3 mLs (0.63 mg total) by nebulization every 4 (four) hours as needed for wheezing.   LOSARTAN (COZAAR) 25 MG TABLET    Take 12.5 mg by mouth daily. Reported on 12/13/2015   METOLAZONE (ZAROXOLYN) 2.5 MG TABLET    Take 2.5 mg by mouth 2 (two) times a week.   NICOTINE (NICODERM CQ - DOSED IN MG/24 HR) 7 MG/24HR PATCH    Place 1 patch (7 mg total) onto the skin daily.   OXYCODONE (OXY IR/ROXICODONE) 5 MG IMMEDIATE RELEASE TABLET    Take 1-2 tablets (5-10 mg total) by mouth every 4 (four) hours as needed for moderate pain, severe pain or breakthrough pain.   POTASSIUM CHLORIDE ER 20 MEQ TBCR    Take 20 mEq by mouth 2 (two) times daily.   SILDENAFIL (VIAGRA) 100 MG TABLET  Take 100 mg by mouth daily as needed for erectile dysfunction. Reported on 12/13/2015   XARELTO 20 MG TABS TABLET    TAKE 1 TABLET (20 MG TOTAL) BY MOUTH DAILY WITH SUPPER.  Modified Medications   No medications on file  Discontinued Medications   DEXTROMETHORPHAN-GUAIFENESIN (MUCINEX DM) 30-600 MG 12HR TABLET    Take 1 tablet by mouth 2 (two) times daily as needed for cough.    Review of Systems  Constitutional: Positive for fever and fatigue.       Morbid obesity  HENT: Negative.   Eyes: Negative.   Respiratory: Positive for cough (yellow sputum) and wheezing.   Cardiovascular: Positive for leg swelling.  Gastrointestinal: Negative for nausea and diarrhea.  Endocrine:       Diabetes mellitus  Genitourinary: Negative.   Musculoskeletal: Positive for back pain, joint swelling, arthralgias, gait problem and neck pain.  Skin: Negative.   Allergic/Immunologic: Negative.   Neurological: Positive for numbness.  Hematological:  Negative.   Psychiatric/Behavioral: Positive for sleep disturbance. The patient is nervous/anxious.     Filed Vitals:   04/09/16 1450  BP: 144/84  Pulse: 96  Temp: 101 F (38.3 C)  TempSrc: Oral  Resp: 18  SpO2: 95%   There is no weight on file to calculate BMI. There were no vitals filed for this visit.   Physical Exam  Constitutional: He is oriented to person, place, and time. He appears well-developed and well-nourished.  Morbid obesity  HENT:  Mouth/Throat: Oropharynx is clear and moist. No oropharyngeal exudate.  Eyes: Pupils are equal, round, and reactive to light. No scleral icterus.  Neck: Neck supple. No thyromegaly present.  Cardiovascular: An irregularly irregular rhythm present.  Murmur heard.  Systolic murmur is present with a grade of 1/6  +1 pitting LE edema. No calf TTP  Pulmonary/Chest: Effort normal. He has no wheezes. He has no rhonchi. He has rales (Left lower lobes). He exhibits tenderness (left lateral rib 7-8 stuck up and TTP).  Abdominal: There is tenderness (Epigastrium).  Epigastric discomfort on palpation.  Musculoskeletal: He exhibits edema (1+ bipedal) and tenderness.  Small and large joint swelling  Lymphadenopathy:    He has no cervical adenopathy.  Neurological: He is alert and oriented to person, place, and time.  Could not remember correct month for his cholecystectomy. Patient thought he had seen Dr. Eulas Post yesterday, but it was 04/04/2016.  Skin: Skin is warm and dry. No rash noted.  Psychiatric: He has a normal mood and affect.  Mildly agitated.    Labs reviewed: Lab Summary Latest Ref Rng 04/04/2016 03/04/2016 01/21/2016 01/03/2016  Hemoglobin 13.0-17.0 g/dL (None) (None) (None) (None)  Hematocrit 39.0-52.0 % (None) (None) (None) (None)  White count - (None) (None) (None) (None)  Platelet count - (None) (None) (None) (None)  Sodium 135 - 146 mmol/L (None) 135 137 126(L)  Potassium 3.5 - 5.3 mmol/L (None) 3.7 3.9 3.5  Calcium 8.6 -  10.3 mg/dL (None) 9.2 9.9 9.1  Phosphorus - (None) (None) (None) (None)  Creatinine 0.70 - 1.33 mg/dL (None) 1.24 1.32 1.43(H)  AST - (None) (None) (None) (None)  Alk Phos - (None) (None) (None) (None)  Bilirubin - (None) (None) (None) (None)  Glucose 65 - 99 mg/dL (None) 119(H) 168(H) 278(H)  Cholesterol - (None) (None) (None) (None)  HDL cholesterol >39 mg/dL 55 (None) (None) (None)  Triglycerides 0 - 149 mg/dL 115 (None) (None) (None)  LDL Direct - (None) (None) (None) (None)  LDL Calc 0 - 99 mg/dL 118(H) (  None) (None) (None)  Total protein - (None) (None) (None) (None)  Albumin - (None) (None) (None) (None)   Lab Results  Component Value Date   TSH 5.852* 01/03/2015   TSH 3.76 01/31/2014   TSH 2.69 10/17/2013   Lab Results  Component Value Date   BUN 51* 03/04/2016   BUN 38* 01/21/2016   BUN 47* 01/03/2016   Lab Results  Component Value Date   HGBA1C 7.9* 04/04/2016   HGBA1C 8.0* 12/26/2015   HGBA1C 7.5* 12/14/2015    Assessment/Plan 1. Left lower lobe pneumonia - moxifloxacin (AVELOX) 400 MG tablet; Take 1 tablet (400 mg total) by mouth daily.  Dispense: 7 tablet; Refill: 0 - chlorpheniramine-HYDROcodone (TUSSIONEX PENNKINETIC ER) 10-8 MG/5ML SUER; 5 cc every 12 hours if needed for cough  Dispense: 140 mL; Refill: 0 - DG Chest 2 View; Future

## 2016-04-10 NOTE — Telephone Encounter (Signed)
Received fax from [facility] and Oxycodone 5mg  was APPROVED from 04/10/2016 to 11/23/2016

## 2016-04-18 ENCOUNTER — Telehealth: Payer: Self-pay | Admitting: *Deleted

## 2016-04-18 DIAGNOSIS — J189 Pneumonia, unspecified organism: Secondary | ICD-10-CM

## 2016-04-18 DIAGNOSIS — J181 Lobar pneumonia, unspecified organism: Secondary | ICD-10-CM

## 2016-04-18 DIAGNOSIS — J018 Other acute sinusitis: Secondary | ICD-10-CM

## 2016-04-18 MED ORDER — MOXIFLOXACIN HCL 400 MG PO TABS: 400.0000 mg | ORAL_TABLET | Freq: Every day | ORAL | Status: DC

## 2016-04-18 NOTE — Telephone Encounter (Signed)
Patient aware of response, rx sent in

## 2016-04-18 NOTE — Telephone Encounter (Signed)
Patient stated that he was seen last week and given an antibiotic and he finished the coarse. Patient states he is still having Mucus (light tan color)  and the bone in his nose and under his eyes hurt and having headaches and sore. Please Advise.

## 2016-04-18 NOTE — Telephone Encounter (Signed)
Avelox 400mg  #3 take 1 tab po daily no RF (already completed 7 days of tx). Continue mucinex and OTC antihistamine daily

## 2016-04-22 MED ORDER — FLUTICASONE PROPIONATE 50 MCG/ACT NA SUSP: NASAL | Status: AC

## 2016-04-22 NOTE — Telephone Encounter (Signed)
Refer to ENT for further eval. Continue antihistamine daily. Rx flonase nasal spray  #1 use 1 spray in each nostril daily for seasonal allergy with 3 RF

## 2016-04-22 NOTE — Telephone Encounter (Signed)
Patient called and stated that he has taken the antibiotic and medications but still has no relief. States he is tired of this sinus infection and feeling so bad. Having pain in his sinus pressure area and top of head sore. Took the 3 antibiotics and did feel alittle better but still bad. Is there anything else to be done. Please Advise.

## 2016-04-22 NOTE — Telephone Encounter (Signed)
Patient notified and agreed. Referral placed and Flonase faxed to pharmacy.

## 2016-04-22 NOTE — Addendum Note (Signed)
Addended by: Nelda Severe A on: 04/22/2016 12:55 PM   Modules accepted: Orders

## 2016-05-09 ENCOUNTER — Other Ambulatory Visit: Payer: Self-pay | Admitting: Internal Medicine

## 2016-05-15 ENCOUNTER — Other Ambulatory Visit: Payer: Self-pay | Admitting: Internal Medicine

## 2016-05-20 ENCOUNTER — Other Ambulatory Visit: Payer: Self-pay | Admitting: *Deleted

## 2016-05-20 DIAGNOSIS — E119 Type 2 diabetes mellitus without complications: Secondary | ICD-10-CM | POA: Diagnosis not present

## 2016-05-20 DIAGNOSIS — H2513 Age-related nuclear cataract, bilateral: Secondary | ICD-10-CM | POA: Diagnosis not present

## 2016-05-20 LAB — HM DIABETES EYE EXAM

## 2016-05-20 MED ORDER — GLUCOSE BLOOD VI STRP: ORAL_STRIP | Status: DC

## 2016-05-20 NOTE — Telephone Encounter (Signed)
[employer] 

## 2016-05-21 ENCOUNTER — Encounter: Payer: Self-pay | Admitting: *Deleted

## 2016-05-22 ENCOUNTER — Encounter: Payer: Self-pay | Admitting: *Deleted

## 2016-05-29 ENCOUNTER — Encounter: Payer: Self-pay | Admitting: *Deleted

## 2016-05-29 ENCOUNTER — Other Ambulatory Visit: Payer: Self-pay

## 2016-05-29 ENCOUNTER — Ambulatory Visit: Payer: Self-pay | Admitting: Nurse Practitioner

## 2016-05-29 MED ORDER — OXYCODONE HCL 5 MG PO TABS: 5.0000 mg | ORAL_TABLET | ORAL | Status: DC | PRN

## 2016-05-29 NOTE — Telephone Encounter (Signed)
Spoke with patient and advised rx ready for pick-up and it will be at the front desk.  

## 2016-05-30 ENCOUNTER — Encounter: Payer: Self-pay | Admitting: Internal Medicine

## 2016-05-30 ENCOUNTER — Ambulatory Visit (INDEPENDENT_AMBULATORY_CARE_PROVIDER_SITE_OTHER): Payer: PPO | Admitting: Internal Medicine

## 2016-05-30 VITALS — BP 110/70 | HR 116 | Temp 97.5°F | Resp 22 | Ht 67.0 in | Wt 272.6 lb

## 2016-05-30 DIAGNOSIS — M25561 Pain in right knee: Secondary | ICD-10-CM

## 2016-05-30 DIAGNOSIS — W19XXXA Unspecified fall, initial encounter: Secondary | ICD-10-CM | POA: Diagnosis not present

## 2016-05-30 DIAGNOSIS — G894 Chronic pain syndrome: Secondary | ICD-10-CM | POA: Diagnosis not present

## 2016-05-30 DIAGNOSIS — Y92099 Unspecified place in other non-institutional residence as the place of occurrence of the external cause: Secondary | ICD-10-CM | POA: Diagnosis not present

## 2016-05-30 DIAGNOSIS — Y92009 Unspecified place in unspecified non-institutional (private) residence as the place of occurrence of the external cause: Secondary | ICD-10-CM

## 2016-05-30 MED ORDER — DICLOFENAC SODIUM 1 % TD GEL: 2.0000 g | Freq: Four times a day (QID) | TRANSDERMAL | Status: AC

## 2016-05-30 NOTE — Patient Instructions (Signed)
Apply cool compress to right knee as needed   Keep leg elevated when seated  May need Ortho eval  Go for right knee xray. Will call with results  Continue current medications as ordered  Follow up as scheduled

## 2016-05-30 NOTE — Progress Notes (Signed)
Patient ID: Jeremiah Perkins, male   DOB: 1961-02-11, 55 y.o.   MRN: [id number]    Location:  [facility] Place of Service: OFFICE  Chief Complaint  Patient presents with  . Acute Visit    fell 2 days ago, hurt lower back right and right knee,    HPI:  55 yo male seen today for acute visit. He fell at home 2 days ago after stepping down 1 step. His right leg gave away and his right hip went into extension and knee internally rotated; left hip flexed; he fell backwards onto back. No head trauma. No LOC. No numbness or tingling. Pain with weightbearing. He has soreness in lower right back. He has taken voltaren gel and oxycodone. Pain improving. He saw ortho Dr Althea Charon in 12/2014 for right knee pain and rec'd steroid injection. He had a MRI at the time.  Depression/anxiety - mood stable although he does get easily frustrated with his brother who has dementia. He is his primary caregiver  DM/neuropathy - BS at home 140- 160s, rarely 200. No low BS reactions.  Metformin stopped due to renal insufficiency (Cr 1.48 --> 1.24). He is on lantus 10 units daily. gabapentin helps neuropathy. A1c 8%  3 mos ago  Arthritis/gout - pain uncontrolled. He takes roxicodone and uses voltaren gel. Occasional gout attacks. He resumed prednisone. Allopurinol for gout maintenance. Followed by rheumatology and was seen last week - had left shoulder injection and rx prednisone taper   Past Medical History  Diagnosis Date  . Hypertension   . Congestive heart failure (HCC)     No echo data available. Myocardial perfusion 2005 with EF 61%, presumed diastolic HF   . Diverticulosis     with history of diverticulitis in 10/2011  . Major depression (HCC)   . Anxiety   . Psychosexual dysfunction with inhibited sexual excitement   . Hyperlipidemia   . Atrial flutter (HCC)   . Morbid obesity (HCC)   . Chronic bronchitis (HCC)     "get it q yr" (01/02/2015)  . Shortness of breath     "all the time" (07/26/2013)  . Chronic back  pain   . GERD (gastroesophageal reflux disease)   . Asthma   . Pneumonia 2013; 2014  . OSA (obstructive sleep apnea) 1998    "wear my CPAP a couple times/month" (01/02/2015)  . Diabetic peripheral neuropathy (HCC)   . Type II diabetes mellitus (HCC) dx'd 1991    previously on insulin in 2000 for 3-4 years (but was stopped because adamantly did not want to be on insulin)  . Arthritis     involving knees, ankles, back, elbows (01/02/2015)  . Gout   . Myocardial infarction [facility])     "one doctor said I did back in 2014"  . Cholelithiasis   . COPD (chronic obstructive pulmonary disease) [facility])     Past Surgical History  Procedure Laterality Date  . Ankle reconstruction Right 1990's    2/2 trauma sustained after fall  . 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 Cyst removal leg Left 1960's    back of  leg  . Tee without cardioversion N/A 07/28/2013    Procedure: TRANSESOPHAGEAL ECHOCARDIOGRAM (TEE);  Surgeon: Laurey Morale, MD;  Location: [facility] ENDOSCOPY;  Service: Cardiovascular;  Laterality: N/A;  . Cardioversion N/A 07/28/2013    Procedure: CARDIOVERSION;  Surgeon: Laurey Morale, MD;  Location: [facility] ENDOSCOPY;  Service: Cardiovascular;  Laterality: N/A;  . Cardioversion N/A 10/26/2013  Procedure: CARDIOVERSION;  Surgeon: Cassell Clement, MD;  Location: [facility] ENDOSCOPY;  Service: Cardiovascular;  Laterality: N/A;  . Left heart catheterization with coronary angiogram N/A 07/27/2013    Procedure: LEFT HEART CATHETERIZATION WITH CORONARY ANGIOGRAM;  Surgeon: Peter M Swaziland, MD;  Location: [facility] CATH LAB;  Service: Cardiovascular;  Laterality: N/A;  . Cardiac catheterization  1990's; 07/2013  . Finger amputation    . Cholecystectomy N/A 08/24/2015    Procedure: LAPAROSCOPIC CHOLECYSTECTOMY WITH INTRAOPERATIVE CHOLANGIOGRAM;  Surgeon: Avel Peace, MD;  Location: [facility] ORS;  Service: General;  Laterality: N/A;    Patient Care Team: Kirt Boys, DO as PCP - General (Internal Medicine) Cassell Clement,  MD as Consulting Physician (Cardiology) Jethro Bolus, MD as Consulting Physician (Ophthalmology)  Social History   Social History  . Marital Status: Married    Spouse Name: N/A  . Number of Children: 3  . Years of Education: 14   Occupational History  . Now unemployed     Barrister's clerk at [employer]  .  [employer]   Social History Main Topics  . Smoking status: Former Smoker -- 0.12 packs/day for 10 years    Types: Cigarettes    Quit date: 09/13/2015  . Smokeless tobacco: Never Used     Comment: rarely.  . Alcohol Use: 0.0 oz/week    0 Standard drinks or equivalent per week     Comment: OCCASIONAL  . Drug Use: No     Comment: LAST USED DEC 2015  . Sexual Activity: Not on file   Other Topics Concern  . 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 They each have children but none between the two of them.      Diet:      Do you drink/ eat things with caffeine?  Yes      Marital status:   Married                             What year were you married ? 2010      Do you live in a house, apartment,assistred living, condo, trailer, etc.)? House      Is it one or more stories? Yes      How many persons live in your home ? 3      Do you have any pets in your home ?(please list) Dog      Current or past profession: Aircraft Mechanic/Instructor      Do you exercise?  Yes                            Type & how often:  Golf,cut grass, walk      Do you have a living will? No      Do you have a DNR form?   No                    If not, do you want to discuss one?       Do you have signed POA?HPOA forms?   No              If so, please bring to your        appointment           reports that he quit smoking about 8 months ago. His smoking use included Cigarettes. He  has a 1.2 pack-year smoking history. He has never used smokeless tobacco. He reports that he drinks alcohol. He reports that he does not use  illicit drugs.  Family History  Problem Relation Age of Onset  . Diabetes Mother   . CAD Mother 36    requiring quadruple bypass  . Hypertension Mother   . Alzheimer's disease Mother   . Stomach cancer Father   . Lung cancer Father     was a smoker  . Gout Father   . CAD Brother 48    requiring quadruple bypass  . Seizures Brother   . Hypertension Brother   . Diabetes Maternal Grandmother   . Alzheimer's disease Maternal Grandmother   . Breast cancer Paternal Aunt   . Diabetes Maternal Aunt   . Seizures Paternal Uncle    Family Status  Relation Status Death Age  . Mother Deceased   . Father Deceased   . Brother Alive   . Brother Deceased   . Brother Deceased   . Brother Deceased   . Brother Deceased   . Son Alive   . Daughter Alive   . Daughter Alive      No Known Allergies  Medications: Patient's Medications  New Prescriptions   No medications on file  Previous Medications   ALLOPURINOL (ZYLOPRIM) 100 MG TABLET    Take 1 tablet (100 mg total) by mouth daily.   BUDESONIDE (PULMICORT) 0.5 MG/2ML NEBULIZER SOLUTION    Take 4 mLs (1 mg total) by nebulization 2 (two) times daily.   BUPROPION (WELLBUTRIN SR) 150 MG 12 HR TABLET    Take 1 tablet (150 mg total) by mouth 2 (two) times daily.   CALCIUM CARBONATE (TUMS - DOSED IN MG ELEMENTAL CALCIUM) 500 MG CHEWABLE TABLET    Chew 1 tablet by mouth as needed for indigestion or heartburn. Reported on 01/15/2016   CARVEDILOL (COREG) 12.5 MG TABLET    Take 1 tablet (12.5 mg total) by mouth 2 (two) times daily with a meal.   CHLORPHENIRAMINE-HYDROCODONE (TUSSIONEX PENNKINETIC ER) 10-8 MG/5ML SUER    5 cc every 12 hours if needed for cough   COLCHICINE 0.6 MG TABLET    Take one tablet by mouth once daily for gout prevention   DICLOFENAC SODIUM (VOLTAREN) 1 % GEL    Apply 2 g topically 4 (four) times daily.   FLUTICASONE (FLONASE) 50 MCG/ACT NASAL SPRAY    Use one spray in each nostril daily for seasonal allergy   FUROSEMIDE  (LASIX) 80 MG TABLET    Take 80 mg by mouth daily.   GABAPENTIN (NEURONTIN) 600 MG TABLET    TAKE 1 TABLET (600MG ) IN THE MORNING AND 2 TABLETS (1200MG ) IN THE EVENING FOR PAINS   GLUCOSE BLOOD (ONE TOUCH ULTRA TEST) TEST STRIP    Use to check blood sugar four times daily. Dx:  E11.8   INSULIN GLARGINE (LANTUS SOLOSTAR) 100 UNIT/ML SOLOSTAR PEN    Inject 10 Units into the skin daily at 10 pm. E11.8   INSULIN PEN NEEDLE 31G X 8 MM MISC    Use daily with the administration of lantus E11.8   IPRATROPIUM (ATROVENT) 0.02 % NEBULIZER SOLUTION    Take 2.5 mLs (0.5 mg total) by nebulization 2 (two) times daily.   LEVALBUTEROL (XOPENEX HFA) 45 MCG/ACT INHALER    Inhale 1-2 puffs into the lungs every 4 (four) hours as needed for wheezing. Reported on 12/13/2015   LEVALBUTEROL (XOPENEX) 0.63 MG/3ML NEBULIZER SOLUTION  Take 3 mLs (0.63 mg total) by nebulization every 4 (four) hours as needed for wheezing.   LOSARTAN (COZAAR) 25 MG TABLET    Take 12.5 mg by mouth daily. Reported on 12/13/2015   METOLAZONE (ZAROXOLYN) 2.5 MG TABLET    Take 2.5 mg by mouth 2 (two) times a week.   MOXIFLOXACIN (AVELOX) 400 MG TABLET    Take 1 tablet (400 mg total) by mouth daily.   OXYCODONE (OXY IR/ROXICODONE) 5 MG IMMEDIATE RELEASE TABLET    Take 1-2 tablets (5-10 mg total) by mouth every 4 (four) hours as needed for moderate pain, severe pain or breakthrough pain.   POTASSIUM CHLORIDE ER 20 MEQ TBCR    Take 20 mEq by mouth 2 (two) times daily.   SILDENAFIL (VIAGRA) 100 MG TABLET    Take 100 mg by mouth daily as needed for erectile dysfunction. Reported on 12/13/2015   XARELTO 20 MG TABS TABLET    TAKE 1 TABLET (20 MG TOTAL) BY MOUTH DAILY WITH SUPPER.  Modified Medications   No medications on file  Discontinued Medications   No medications on file    Review of Systems  Constitutional: Positive for fatigue.  Musculoskeletal: Positive for back pain, joint swelling, arthralgias and gait problem.  All other systems reviewed  and are negative.   Filed Vitals:   05/30/16 1456  BP: 110/70  Pulse: 116  Temp: 97.5 F (36.4 C)  TempSrc: Oral  Resp: 22  Height:  (1.702 m)  Weight: 272 lb 9.6 oz (123.651 kg)  SpO2: 97%   Body mass index is 42.69 kg/(m^2).  Physical Exam  Constitutional: He is oriented to person, place, and time. He appears well-developed and well-nourished.  Cardiovascular:  Trace LE edema b/l. No calf TTP  Musculoskeletal: He exhibits edema and tenderness.       Back:  Right knee medial swelling with TTP; neg drawer test; (+) mcmurray test; reduce extension and flexion with grinding on ROM; left knee with min swelling and crepitus on ROM; gait antalgic; neg SLR  Neurological: He is alert and oriented to person, place, and time.  Skin: Skin is warm and dry. No rash noted.  Psychiatric: He has a normal mood and affect. His behavior is normal. Thought content normal.     Labs reviewed: Abstract on 05/21/2016  Component Date Value Ref Range Status  . HM Diabetic Eye Exam 05/20/2016 No Retinopathy  No Retinopathy Final   [facility] [phone]  Office Visit on 04/04/2016  Component Date Value Ref Range Status  . Cholesterol, Total 04/04/2016 196  100 - 199 mg/dL Final  . Triglycerides 04/04/2016 115  0 - 149 mg/dL Final  . HDL 29/56/2130 55  >39 mg/dL Final  . VLDL Cholesterol Cal 04/04/2016 23  5 - 40 mg/dL Final  . LDL Calculated 04/04/2016 865* 0 - 99 mg/dL Final  . Chol/HDL Ratio 04/04/2016 3.6  0.0 - 5.0 ratio units Final   Comment:                                   T. Chol/HDL Ratio                                             Men  Women  1/2 Avg.Risk  3.4    3.3                                   Avg.Risk  5.0    4.4                                2X Avg.Risk  9.6    7.1                                3X Avg.Risk 23.4   11.0   . Hgb A1c MFr Bld 04/04/2016 7.9* 4.8 - 5.6 % Final   Comment:          Pre-diabetes: 5.7 - 6.4           Diabetes: >6.4          Glycemic control for adults with diabetes: <7.0   . Est. average glucose Bld gHb Est-m* 04/04/2016 180   Final  Office Visit on 03/04/2016  Component Date Value Ref Range Status  . Sodium 03/04/2016 135  135 - 146 mmol/L Final  . Potassium 03/04/2016 3.7  3.5 - 5.3 mmol/L Final  . Chloride 03/04/2016 93* 98 - 110 mmol/L Final  . CO2 03/04/2016 23  20 - 31 mmol/L Final  . Glucose, Bld 03/04/2016 119* 65 - 99 mg/dL Final  . BUN 16/08/9603 51* 7 - 25 mg/dL Final  . Creat 54/07/8118 1.24  0.70 - 1.33 mg/dL Final  . Calcium 14/78/2956 9.2  8.6 - 10.3 mg/dL Final  . Brain Natriuretic Peptide 03/04/2016 127.8* <100 pg/mL Final   Comment:   BNP levels increase with age in the general population with the highest values seen in individuals greater than 35 years of age. Reference: Earlyne Iba Cardiol 2002; 21:308-65.       No results found.   Assessment/Plan    ICD-9-CM ICD-10-CM   1. Right knee pain 719.46 M25.561 DG Knee Complete 4 Views Right  2. Fall at home, initial encounter 5102227912 W19.XXXA    E849.0 Y92.099   3. Chronic pain syndrome 338.4 G89.4    Apply cool compress to right knee as needed   Keep leg elevated when seated  May need Ortho eval  Go for right knee xray. Will call with results  Continue current medications as ordered  Follow up as scheduled   Doylene Splinter S. Ancil Linsey  [facility] [address] [place], [place] [address] [phone] Cell (Monday-Friday 8 AM - 5 PM) [phone] After 5 PM and follow prompts

## 2016-06-03 ENCOUNTER — Ambulatory Visit: Payer: PPO | Admitting: Cardiovascular Disease

## 2016-06-03 DIAGNOSIS — R0989 Other specified symptoms and signs involving the circulatory and respiratory systems: Secondary | ICD-10-CM

## 2016-06-05 ENCOUNTER — Telehealth: Payer: Self-pay

## 2016-06-05 MED ORDER — GLUCOSE BLOOD VI STRP: ORAL_STRIP | Status: DC

## 2016-06-05 NOTE — Telephone Encounter (Signed)
He may apply voltaren gel to lower back 2 times daily. May also apply cool compresses prn to lower back. It will take time for back to heal. If no better in next wk, t/c imaging studies

## 2016-06-05 NOTE — Telephone Encounter (Signed)
Patient states he started using Voltaren Gel last night and will follow instructions as listed below. Patient will call if no better next week

## 2016-06-05 NOTE — Telephone Encounter (Signed)
Message left on triage voicemail: 1.) Patient still with pain on right lower side of back related to fall. Patient would like to know the next step in addressing his pain. Last OV 05-30-16    2.) Need RX sent to pharmacy for Accu-Check test strips, someone sent in rx for Onetouch test strips, patient switched machine. Corrected and sent rx to [name]    Dr.Cater please advise on # 1

## 2016-06-09 ENCOUNTER — Other Ambulatory Visit: Payer: Self-pay

## 2016-06-09 NOTE — Telephone Encounter (Signed)
He has agreed to order through December then patient has to have PCP order.  Will you call to order?  Thank you

## 2016-06-10 ENCOUNTER — Telehealth: Payer: Self-pay

## 2016-06-10 ENCOUNTER — Other Ambulatory Visit: Payer: Self-pay | Admitting: *Deleted

## 2016-06-10 MED ORDER — SILDENAFIL CITRATE 100 MG PO TABS: 100.0000 mg | ORAL_TABLET | Freq: Every day | ORAL | Status: AC | PRN

## 2016-06-10 MED ORDER — GLUCOSE BLOOD VI STRP: ORAL_STRIP | Status: DC

## 2016-06-10 NOTE — Telephone Encounter (Signed)
Called and spoke with patient and he was very upset that Dr Elease Hashimoto would only refill until December of this year (2017) Patient stated he will call/come by and speak with Dr Elease Hashimoto himself. Then he hung up the phone.

## 2016-06-10 NOTE — Telephone Encounter (Signed)
Order patients viagra. Order number [id number].

## 2016-06-10 NOTE — Telephone Encounter (Signed)
Pharmacy did not receive Rx. Refaxed.

## 2016-06-11 ENCOUNTER — Encounter: Payer: Self-pay | Admitting: Cardiovascular Disease

## 2016-06-16 ENCOUNTER — Other Ambulatory Visit: Payer: Self-pay | Admitting: *Deleted

## 2016-06-16 MED ORDER — LOSARTAN POTASSIUM 25 MG PO TABS: 12.5000 mg | ORAL_TABLET | Freq: Every day | ORAL | 2 refills | Status: AC

## 2016-06-24 DIAGNOSIS — N189 Chronic kidney disease, unspecified: Secondary | ICD-10-CM | POA: Diagnosis not present

## 2016-06-24 DIAGNOSIS — M7532 Calcific tendinitis of left shoulder: Secondary | ICD-10-CM | POA: Diagnosis not present

## 2016-06-24 DIAGNOSIS — M109 Gout, unspecified: Secondary | ICD-10-CM | POA: Diagnosis not present

## 2016-06-24 DIAGNOSIS — M545 Low back pain: Secondary | ICD-10-CM | POA: Diagnosis not present

## 2016-06-24 DIAGNOSIS — E118 Type 2 diabetes mellitus with unspecified complications: Secondary | ICD-10-CM | POA: Diagnosis not present

## 2016-07-01 ENCOUNTER — Telehealth: Payer: Self-pay

## 2016-07-01 ENCOUNTER — Other Ambulatory Visit: Payer: Self-pay | Admitting: *Deleted

## 2016-07-01 MED ORDER — OXYCODONE HCL 5 MG PO TABS: 5.0000 mg | ORAL_TABLET | ORAL | 0 refills | Status: DC | PRN

## 2016-07-01 NOTE — Telephone Encounter (Signed)
Patient requested and will pick up 

## 2016-07-01 NOTE — Telephone Encounter (Signed)
I called patient to let him know that his Rx is ready to pick up. Rx was placed in the filing cabinet at front desk.

## 2016-07-08 DIAGNOSIS — E119 Type 2 diabetes mellitus without complications: Secondary | ICD-10-CM | POA: Diagnosis not present

## 2016-07-08 DIAGNOSIS — M79671 Pain in right foot: Secondary | ICD-10-CM | POA: Diagnosis not present

## 2016-07-08 DIAGNOSIS — N189 Chronic kidney disease, unspecified: Secondary | ICD-10-CM | POA: Diagnosis not present

## 2016-07-08 DIAGNOSIS — M109 Gout, unspecified: Secondary | ICD-10-CM | POA: Diagnosis not present

## 2016-07-08 LAB — CBC AND DIFFERENTIAL
HCT: 40 % — AB (ref 41–53)
HEMOGLOBIN: 13.7 g/dL (ref 13.5–17.5)
PLATELETS: 342 10*3/uL (ref 150–399)
WBC: 7.2 10^3/mL

## 2016-07-08 LAB — BASIC METABOLIC PANEL
BUN: 25 mg/dL — AB (ref 4–21)
CREATININE: 1.2 mg/dL (ref 0.6–1.3)
Glucose: 182 mg/dL
Potassium: 3.6 mmol/L (ref 3.4–5.3)
Sodium: 140 mmol/L (ref 137–147)

## 2016-07-08 LAB — HEPATIC FUNCTION PANEL
ALK PHOS: 88 U/L (ref 25–125)
ALT: 13 U/L (ref 10–40)
AST: 21 U/L (ref 14–40)
Bilirubin, Total: 0.5 mg/dL

## 2016-07-09 ENCOUNTER — Encounter: Payer: Self-pay | Admitting: *Deleted

## 2016-07-09 NOTE — Progress Notes (Unsigned)
Received fax from Dr. Stacey Drain of patient's labs. Abstracted and given to Dr. Montez Morita to review. Patient has been notified by Dr. Ines Bloomer office  Uric Acid 5.7

## 2016-07-18 ENCOUNTER — Ambulatory Visit: Payer: PPO | Admitting: Internal Medicine

## 2016-08-20 ENCOUNTER — Emergency Department (HOSPITAL_COMMUNITY): Payer: PPO

## 2016-08-20 ENCOUNTER — Encounter (HOSPITAL_COMMUNITY): Payer: Self-pay

## 2016-08-20 ENCOUNTER — Emergency Department (HOSPITAL_COMMUNITY)
Admission: EM | Admit: 2016-08-20 | Discharge: 2016-08-20 | Disposition: A | Discharge status: Home / Self Care | Payer: PPO | Attending: Emergency Medicine | Admitting: Emergency Medicine

## 2016-08-20 DIAGNOSIS — Z87891 Personal history of nicotine dependence: Secondary | ICD-10-CM | POA: Diagnosis not present

## 2016-08-20 DIAGNOSIS — J45901 Unspecified asthma with (acute) exacerbation: Secondary | ICD-10-CM | POA: Diagnosis not present

## 2016-08-20 DIAGNOSIS — Z79899 Other long term (current) drug therapy: Secondary | ICD-10-CM | POA: Diagnosis not present

## 2016-08-20 DIAGNOSIS — E039 Hypothyroidism, unspecified: Secondary | ICD-10-CM | POA: Insufficient documentation

## 2016-08-20 DIAGNOSIS — Z794 Long term (current) use of insulin: Secondary | ICD-10-CM | POA: Insufficient documentation

## 2016-08-20 DIAGNOSIS — E876 Hypokalemia: Secondary | ICD-10-CM | POA: Diagnosis not present

## 2016-08-20 DIAGNOSIS — Z7901 Long term (current) use of anticoagulants: Secondary | ICD-10-CM | POA: Diagnosis not present

## 2016-08-20 DIAGNOSIS — E1122 Type 2 diabetes mellitus with diabetic chronic kidney disease: Secondary | ICD-10-CM | POA: Insufficient documentation

## 2016-08-20 DIAGNOSIS — R0602 Shortness of breath: Secondary | ICD-10-CM | POA: Diagnosis not present

## 2016-08-20 DIAGNOSIS — I5022 Chronic systolic (congestive) heart failure: Secondary | ICD-10-CM | POA: Insufficient documentation

## 2016-08-20 DIAGNOSIS — J441 Chronic obstructive pulmonary disease with (acute) exacerbation: Secondary | ICD-10-CM | POA: Insufficient documentation

## 2016-08-20 DIAGNOSIS — R042 Hemoptysis: Secondary | ICD-10-CM | POA: Diagnosis not present

## 2016-08-20 DIAGNOSIS — N183 Chronic kidney disease, stage 3 (moderate): Secondary | ICD-10-CM | POA: Diagnosis not present

## 2016-08-20 DIAGNOSIS — I130 Hypertensive heart and chronic kidney disease with heart failure and stage 1 through stage 4 chronic kidney disease, or unspecified chronic kidney disease: Secondary | ICD-10-CM | POA: Insufficient documentation

## 2016-08-20 DIAGNOSIS — N289 Disorder of kidney and ureter, unspecified: Secondary | ICD-10-CM

## 2016-08-20 LAB — BASIC METABOLIC PANEL
ANION GAP: 13 (ref 5–15)
BUN: 56 mg/dL — ABNORMAL HIGH (ref 6–20)
CALCIUM: 9.1 mg/dL (ref 8.9–10.3)
CO2: 34 mmol/L — ABNORMAL HIGH (ref 22–32)
CREATININE: 1.68 mg/dL — AB (ref 0.61–1.24)
Chloride: 88 mmol/L — ABNORMAL LOW (ref 101–111)
GFR, EST AFRICAN AMERICAN: 51 mL/min — AB (ref >60)
GFR, EST NON AFRICAN AMERICAN: 44 mL/min — AB (ref >60)
GLUCOSE: 183 mg/dL — AB (ref 65–99)
Potassium: 2.7 mmol/L — CL (ref 3.5–5.1)
Sodium: 135 mmol/L (ref 135–145)

## 2016-08-20 LAB — BRAIN NATRIURETIC PEPTIDE: B NATRIURETIC PEPTIDE 5: 173.6 pg/mL — AB (ref 0.0–100.0)

## 2016-08-20 LAB — CBC WITH DIFFERENTIAL/PLATELET
Basophils Absolute: 0 10*3/uL (ref 0.0–0.1)
Basophils Relative: 0 %
EOS ABS: 0.2 10*3/uL (ref 0.0–0.7)
EOS PCT: 3 %
HCT: 41.4 % (ref 39.0–52.0)
Hemoglobin: 13.6 g/dL (ref 13.0–17.0)
LYMPHS ABS: 1.8 10*3/uL (ref 0.7–4.0)
LYMPHS PCT: 27 %
MCH: 30.6 pg (ref 26.0–34.0)
MCHC: 32.9 g/dL (ref 30.0–36.0)
MCV: 93 fL (ref 78.0–100.0)
MONO ABS: 0.5 10*3/uL (ref 0.1–1.0)
Monocytes Relative: 8 %
Neutro Abs: 4.2 10*3/uL (ref 1.7–7.7)
Neutrophils Relative %: 62 %
PLATELETS: 273 10*3/uL (ref 150–400)
RBC: 4.45 MIL/uL (ref 4.22–5.81)
RDW: 14.7 % (ref 11.5–15.5)
WBC: 6.7 10*3/uL (ref 4.0–10.5)

## 2016-08-20 LAB — TROPONIN I
TROPONIN I: 0.08 ng/mL — AB (ref <0.03)
Troponin I: 0.09 ng/mL (ref <0.03)

## 2016-08-20 MED ORDER — POTASSIUM CHLORIDE 10 MEQ/100ML IV SOLN
10.0000 meq | Freq: Once | INTRAVENOUS | Status: AC
  Administered 2016-08-20: 10 meq via INTRAVENOUS
  Filled 2016-08-20: qty 100

## 2016-08-20 MED ORDER — POTASSIUM CHLORIDE CRYS ER 20 MEQ PO TBCR: 20.0000 meq | EXTENDED_RELEASE_TABLET | Freq: Two times a day (BID) | ORAL | 0 refills | Status: DC

## 2016-08-20 MED ORDER — POTASSIUM CHLORIDE CRYS ER 20 MEQ PO TBCR
40.0000 meq | EXTENDED_RELEASE_TABLET | Freq: Once | ORAL | Status: AC
  Administered 2016-08-20: 40 meq via ORAL
  Filled 2016-08-20: qty 2

## 2016-08-20 NOTE — ED Provider Notes (Signed)
[facility]-EMERGENCY DEPT Provider Note   CSN: [id number] Arrival date & time: 08/20/16  0225     History   Chief Complaint Chief Complaint  Patient presents with  . Hemoptysis  . Shortness of Breath    HPI Jeremiah Perkins is a 55 y.o. male. He has a history of atrial flutter, asthma, diabetes, hypertension, hyperlipidemia, heart failure. He woke up this evening and felt some phlegm in his throat. He had had it several times and eventually brought up some blood. He states it was just blood, no mucus with it. He did notice some mild dyspnea. He was sore in his chest wall he was coughing but did not have any chest discomfort other than with the cough. He denies fever, chills, sweats. He denies nausea or vomiting. He's never coughed up blood before. Of note, he is anticoagulated on rivaroxaban because of history of atrial flutter.  The history is provided by the patient.    Past Medical History:  Diagnosis Date  . Anxiety   . Arthritis    involving knees, ankles, back, elbows (01/02/2015)  . Asthma   . Atrial flutter (HCC)   . Cholelithiasis   . Chronic back pain   . Chronic bronchitis (HCC)    "get it q yr" (01/02/2015)  . Congestive heart failure (HCC)    No echo data available. Myocardial perfusion 2005 with EF 61%, presumed diastolic HF   . COPD (chronic obstructive pulmonary disease) (HCC)   . Diabetic peripheral neuropathy (HCC)   . Diverticulosis    with history of diverticulitis in 10/2011  . GERD (gastroesophageal reflux disease)   . Gout   . Hyperlipidemia   . Hypertension   . Major depression (HCC)   . Morbid obesity (HCC)   . Myocardial infarction [facility])    "one doctor said I did back in 2014"  . OSA (obstructive sleep apnea) 1998   "wear my CPAP a couple times/month" (01/02/2015)  . Pneumonia 2013; 2014  . Psychosexual dysfunction with inhibited sexual excitement   . Shortness of breath    "all the time" (07/26/2013)  . Type II diabetes mellitus (HCC) dx'd 1991   previously on insulin in 2000 for 3-4 years (but was stopped because adamantly did not want to be on insulin)    Patient Active Problem List   Diagnosis Date Noted  . Pneumonia 04/09/2016  . Essential hypertension   . Elevated troponin I level   . COPD exacerbation (HCC) 12/14/2015  . CAP (community acquired pneumonia) 12/14/2015  . Asthma exacerbation 12/13/2015  . Elevated troponin 12/13/2015  . Acute on chronic systolic heart failure, NYHA class 3 (HCC) 12/13/2015  . Asthma, chronic 11/02/2015  . RUQ abdominal pain 07/25/2015  . Hypotension 07/25/2015  . Primary gout 05/31/2015  . Calculus of gallbladder w/o mention of cholecystitis or obstruction 05/30/2015  . Subclinical hypothyroidism 01/03/2015  . Chronic kidney disease (CKD), stage III (moderate) 01/03/2015  . Abdominal discomfort, epigastric 07/28/2014  . Lower back pain 02/27/2014  . Healthcare maintenance 10/07/2013  . Long term current use of anticoagulant therapy 07/22/2013  . Atrial flutter (HCC) 07/15/2013  . Morbid obesity (HCC) 11/11/2012  . Arthritis   . Major depression (HCC)   . Diabetic peripheral neuropathy (HCC)   . Psychosexual dysfunction with inhibited sexual excitement   . OSA (obstructive sleep apnea)   . Hypertension   . Chronic systolic heart failure (HCC)   . Gout   . Diabetes mellitus type 2, controlled, with complications (HCC)  11/30/1989    Past Surgical History:  Procedure Laterality Date  . 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 ANKLE RECONSTRUCTION Right 1990's   2/2 trauma sustained after fall  . CARDIAC CATHETERIZATION  1990's; 07/2013  . CARDIOVERSION N/A 07/28/2013   Procedure: CARDIOVERSION;  Surgeon: Laurey Morale, MD;  Location: [facility] ENDOSCOPY;  Service: Cardiovascular;  Laterality: N/A;  . CARDIOVERSION N/A 10/26/2013   Procedure: CARDIOVERSION;  Surgeon: Cassell Clement, MD;  Location: [facility] ENDOSCOPY;  Service: Cardiovascular;  Laterality: N/A;  . CHOLECYSTECTOMY N/A 08/24/2015     Procedure: LAPAROSCOPIC CHOLECYSTECTOMY WITH INTRAOPERATIVE CHOLANGIOGRAM;  Surgeon: Avel Peace, MD;  Location: [facility] ORS;  Service: General;  Laterality: N/A;  . CYST REMOVAL LEG Left 1960's   back of  leg  . FINGER AMPUTATION    . LEFT HEART CATHETERIZATION WITH CORONARY ANGIOGRAM N/A 07/27/2013   Procedure: LEFT HEART CATHETERIZATION WITH CORONARY ANGIOGRAM;  Surgeon: Peter M Swaziland, MD;  Location: [facility] CATH LAB;  Service: Cardiovascular;  Laterality: N/A;  . TEE WITHOUT CARDIOVERSION N/A 07/28/2013   Procedure: TRANSESOPHAGEAL ECHOCARDIOGRAM (TEE);  Surgeon: Laurey Morale, MD;  Location: [facility] ENDOSCOPY;  Service: Cardiovascular;  Laterality: N/A;       Home Medications    Prior to Admission medications   Medication Sig Start Date End Date Taking? Authorizing Provider  allopurinol (ZYLOPRIM) 100 MG tablet Take 1 tablet (100 mg total) by mouth daily. 12/21/15  Yes Ripudeep K Rai, MD  budesonide (PULMICORT) 0.5 MG/2ML nebulizer solution Take 4 mLs (1 mg total) by nebulization 2 (two) times daily. 12/25/15  Yes Monica Montez Morita, DO  buPROPion (WELLBUTRIN SR) 150 MG 12 hr tablet Take 1 tablet (150 mg total) by mouth 2 (two) times daily. 04/04/16  Yes Kirt Boys, DO  calcium carbonate (TUMS - DOSED IN MG ELEMENTAL CALCIUM) 500 MG chewable tablet Chew 1 tablet by mouth as needed for indigestion or heartburn. Reported on 01/15/2016   Yes Historical Provider, MD  carvedilol (COREG) 12.5 MG tablet Take 1 tablet (12.5 mg total) by mouth 2 (two) times daily with a meal. 03/27/16  Yes Vesta Mixer, MD  colchicine 0.6 MG tablet Take one tablet by mouth once daily for gout prevention 07/03/15  Yes Kirt Boys, DO  diclofenac sodium (VOLTAREN) 1 % GEL Apply 2 g topically 4 (four) times daily. 05/30/16  Yes Kirt Boys, DO  fluticasone (FLONASE) 50 MCG/ACT nasal spray Use one spray in each nostril daily for seasonal allergy 04/22/16  Yes Kirt Boys, DO  furosemide (LASIX) 80 MG tablet Take 80 mg by mouth  daily.   Yes Historical Provider, MD  gabapentin (NEURONTIN) 600 MG tablet TAKE 1 TABLET (600MG ) IN THE MORNING AND 2 TABLETS (1200MG ) IN THE EVENING FOR PAINS 05/16/16  Yes Kirt Boys, DO  Insulin Glargine (LANTUS SOLOSTAR) 100 UNIT/ML Solostar Pen Inject 10 Units into the skin daily at 10 pm. E11.8 Patient taking differently: Inject 10 Units into the skin every morning. E11.8 04/04/16  Yes Monica Carter, DO  ipratropium (ATROVENT) 0.02 % nebulizer solution Take 2.5 mLs (0.5 mg total) by nebulization 2 (two) times daily. 12/25/15  Yes Monica Montez Morita, DO  levalbuterol [facility] HFA) 45 MCG/ACT inhaler Inhale 1-2 puffs into the lungs every 4 (four) hours as needed for wheezing. Reported on 12/13/2015   Yes Historical Provider, MD  levalbuterol Pauline Aus) 0.63 MG/3ML nebulizer solution Take 3 mLs (0.63 mg total) by nebulization every 4 (four) hours as needed for wheezing. 12/25/15  Yes Kirt Boys, DO  losartan (COZAAR) 25 MG tablet Take 0.5 tablets (12.5 mg total) by mouth daily. Reported on 12/13/2015 06/16/16  Yes Vesta MixerPhilip J Nahser, MD  metolazone (ZAROXOLYN) 2.5 MG tablet Take 2.5 mg by mouth 2 (two) times a week.   Yes Historical Provider, MD  oxyCODONE (OXY IR/ROXICODONE) 5 MG immediate release tablet Take 1-2 tablets (5-10 mg total) by mouth every 4 (four) hours as needed for moderate pain, severe pain or breakthrough pain. 07/01/16  Yes Sharon SellerJessica K Eubanks, NP  sildenafil (VIAGRA) 100 MG tablet Take 1 tablet (100 mg total) by mouth daily as needed for erectile dysfunction. Reported on 12/13/2015 06/10/16  Yes Vesta MixerPhilip J Nahser, MD  XARELTO 20 MG TABS tablet TAKE 1 TABLET (20 MG TOTAL) BY MOUTH DAILY WITH SUPPER. 01/04/16  Yes Cassell Clementhomas Brackbill, MD  chlorpheniramine-HYDROcodone [facility](TUSSIONEX PENNKINETIC ER) 10-8 MG/5ML SUER 5 cc every 12 hours if needed for cough Patient not taking: Reported on 08/20/2016 04/09/16   Kimber RelicArthur G Green, MD  glucose blood (ACCU-CHEK AVIVA PLUS) test strip Use to check blood sugar four times  daily. Dx: E11.8 06/10/16   Kirt BoysMonica Carter, DO  Insulin Pen Needle 31G X 8 MM MISC Use daily with the administration of lantus E11.8 04/04/16   Kirt BoysMonica Carter, DO  moxifloxacin (AVELOX) 400 MG tablet Take 1 tablet (400 mg total) by mouth daily. Patient not taking: Reported on 08/20/2016 04/18/16   Kirt BoysMonica Carter, DO  Potassium Chloride ER 20 MEQ TBCR Take 20 mEq by mouth 2 (two) times daily. Patient not taking: Reported on 08/20/2016 01/04/16   Cassell Clementhomas Brackbill, MD    Family History Family History  Problem Relation Age of Onset  . Diabetes Mother   . CAD Mother 1070    requiring quadruple bypass  . Hypertension Mother   . Alzheimer's disease Mother   . Stomach cancer Father   . Lung cancer Father     was a smoker  . Gout Father   . CAD Brother 48    requiring quadruple bypass  . Seizures Brother   . Hypertension Brother   . Diabetes Maternal Grandmother   . Alzheimer's disease Maternal Grandmother   . Breast cancer Paternal Aunt   . Diabetes Maternal Aunt   . Seizures Paternal Uncle     Social History Social History  Substance Use Topics  . Smoking status: Former Smoker    Packs/day: 0.12    Years: 10.00    Types: Cigarettes    Quit date: 09/13/2015  . Smokeless tobacco: Never Used     Comment: rarely.  . Alcohol use 0.0 oz/week     Comment: OCCASIONAL     Allergies   Review of patient's allergies indicates no known allergies.   Review of Systems Review of Systems  All other systems reviewed and are negative.    Physical Exam Updated Vital Signs BP 99/78 (BP Location: Left Arm)   Pulse 104   Temp 98.1 F (36.7 C) (Oral)   Resp 18   Ht 5\' 7"  (1.702 m)   Wt 274 lb (124.3 kg)   SpO2 91%   BMI 42.91 kg/m   Physical Exam  Nursing note and vitals reviewed.  55 year old male, resting comfortably and in no acute distress. Vital signs are significant for borderline tachycardia. Oxygen saturation is 91%, which is hypoxic. Head is normocephalic and atraumatic.  PERRLA, EOMI. Oropharynx is clear. Neck is nontender and supple without adenopathy or JVD. Back is nontender and there is no CVA tenderness. Lungs are clear  without rales, wheezes, or rhonchi. Chest is nontender. Heart has regular rate and rhythm without murmur. Abdomen is soft, flat, nontender without masses or hepatosplenomegaly and peristalsis is normoactive. Extremities have trace edema, full range of motion is present. Skin is warm and dry without rash. Neurologic: Mental status is normal, cranial nerves are intact, there are no motor or sensory deficits.  ED Treatments / Results  Labs (all labs ordered are listed, but only abnormal results are displayed) Labs Reviewed  BASIC METABOLIC PANEL - Abnormal; Notable for the following:       Result Value   Potassium 2.7 (*)    Chloride 88 (*)    CO2 34 (*)    Glucose, Bld 183 (*)    BUN 56 (*)    Creatinine, Ser 1.68 (*)    GFR calc non Af Amer 44 (*)    GFR calc Af Amer 51 (*)    All other components within normal limits  TROPONIN I - Abnormal; Notable for the following:    Troponin I 0.09 (*)    All other components within normal limits  BRAIN NATRIURETIC PEPTIDE - Abnormal; Notable for the following:    B Natriuretic Peptide 173.6 (*)    All other components within normal limits  CBC WITH DIFFERENTIAL/PLATELET  TROPONIN I    EKG  EKG Interpretation Rhythm: Atrial fibrillation with PVCs Axis: Normal Rate: Normal ST-T wave: Normal Intervals: Prolonged QT interval Low QRS voltage When compared with ECG of 09/01/2016, QT interval has lengthened       Radiology Dg Chest 2 View  Result Date: 08/20/2016 CLINICAL DATA:  Shortness of breath for 24 hours. History of hypertension, CHF. EXAM: CHEST  2 VIEW COMPARISON:  Chest radiograph Apr 09, 2016 FINDINGS: The cardiac silhouette is mildly enlarged, unchanged. Mediastinal silhouette is nonsuspicious. Similar pulmonary vascular congestion without pleural effusion or focal  consolidation. Increased lung volumes with flattened hemidiaphragms. No pneumothorax. Large body habitus. Moderate degenerative change of the thoracic spine. Surgical clips in the included right abdomen compatible with cholecystectomy. IMPRESSION: Similar cardiomegaly and pulmonary vascular congestion. COPD. Electronically Signed   By: Awilda Metro M.D.   On: 08/20/2016 03:58    Procedures Procedures (including critical care time)  Medications Ordered in ED Medications  potassium chloride 10 mEq in 100 mL IVPB (10 mEq Intravenous Rate/Dose Change 08/20/16 0749)  potassium chloride SA (K-DUR,KLOR-CON) CR tablet 40 mEq (40 mEq Oral Given 08/20/16 0738)     Initial Impression / Assessment and Plan / ED Course  I have reviewed the triage vital signs and the nursing notes.  Pertinent labs & imaging results that were available during my care of the patient were reviewed by me and considered in my medical decision making (see chart for details).  Clinical Course   Transient episode of hemoptysis. No further hemoptysis since a single episode and none while in the ED. He is anticoagulated on rivaroxaban. Chest x-ray shows no acute process. Because of chest pain, troponin was checked and it was minimally elevated. It had also been mildly elevated when hospitalized last January. I suspect that this is a chronic elevation and he will be kept in the ED for a delta troponin. He has mild renal insufficiency which is slightly worse from baseline but only mild change - this will need to be followed as an outpatient. He is markedly hypokalemic and is given oral and intravenous potassium. He will need to be sent home with a prescription for potassium supplement. Case is  signed out to Dr. Clydene Pugh to evaluate repeat troponin.  Final Clinical Impressions(s) / ED Diagnoses   Final diagnoses:  Hemoptysis  Current use of long term anticoagulation  Renal insufficiency  Hypokalemia    New Prescriptions New  Prescriptions   POTASSIUM CHLORIDE SA (K-DUR,KLOR-CON) 20 MEQ TABLET    Take 1 tablet (20 mEq total) by mouth 2 (two) times daily.     Dione Booze, MD 08/20/16 [phone]

## 2016-08-20 NOTE — ED Triage Notes (Addendum)
Pt states that he woke up tonight and cleared his throat. He coughed up blood. He states that he was not vomiting blood, but was coughing it up. Pt also endorses SOB and R sided chest wall pain. Hx CHF, A flutter. A&Ox4. Ambulatory.

## 2016-08-27 ENCOUNTER — Ambulatory Visit (INDEPENDENT_AMBULATORY_CARE_PROVIDER_SITE_OTHER): Payer: PPO | Admitting: Internal Medicine

## 2016-08-27 ENCOUNTER — Ambulatory Visit
Admission: RE | Admit: 2016-08-27 | Discharge: 2016-08-27 | Disposition: A | Discharge status: Home / Self Care | Payer: PPO | Source: Ambulatory Visit | Attending: Internal Medicine | Admitting: Internal Medicine

## 2016-08-27 ENCOUNTER — Encounter: Payer: Self-pay | Admitting: Internal Medicine

## 2016-08-27 VITALS — BP 122/78 | HR 94 | Temp 97.9°F | Ht 67.0 in

## 2016-08-27 DIAGNOSIS — M1A00X0 Idiopathic chronic gout, unspecified site, without tophus (tophi): Secondary | ICD-10-CM

## 2016-08-27 DIAGNOSIS — E876 Hypokalemia: Secondary | ICD-10-CM | POA: Diagnosis not present

## 2016-08-27 DIAGNOSIS — S90222A Contusion of left lesser toe(s) with damage to nail, initial encounter: Secondary | ICD-10-CM

## 2016-08-27 DIAGNOSIS — R042 Hemoptysis: Secondary | ICD-10-CM

## 2016-08-27 DIAGNOSIS — M179 Osteoarthritis of knee, unspecified: Secondary | ICD-10-CM | POA: Diagnosis not present

## 2016-08-27 DIAGNOSIS — M25562 Pain in left knee: Secondary | ICD-10-CM

## 2016-08-27 LAB — COMPLETE METABOLIC PANEL WITH GFR
ALT: 16 U/L (ref 9–46)
AST: 25 U/L (ref 10–35)
Albumin: 3.5 g/dL — ABNORMAL LOW (ref 3.6–5.1)
Alkaline Phosphatase: 71 U/L (ref 40–115)
BUN: 27 mg/dL — AB (ref 7–25)
CALCIUM: 8.9 mg/dL (ref 8.6–10.3)
CHLORIDE: 92 mmol/L — AB (ref 98–110)
CO2: 33 mmol/L — ABNORMAL HIGH (ref 20–31)
CREATININE: 1.18 mg/dL (ref 0.70–1.33)
GFR, Est African American: 79 mL/min (ref >=60)
GFR, Est Non African American: 68 mL/min (ref >=60)
GLUCOSE: 137 mg/dL — AB (ref 65–99)
Potassium: 3.4 mmol/L — ABNORMAL LOW (ref 3.5–5.3)
SODIUM: 137 mmol/L (ref 135–146)
Total Bilirubin: 1.6 mg/dL — ABNORMAL HIGH (ref 0.2–1.2)
Total Protein: 6.7 g/dL (ref 6.1–8.1)

## 2016-08-27 LAB — URIC ACID: URIC ACID, SERUM: 4.6 mg/dL (ref 4.0–8.0)

## 2016-08-27 MED ORDER — OXYCODONE HCL 5 MG PO TABS: 5.0000 mg | ORAL_TABLET | ORAL | 0 refills | Status: DC | PRN

## 2016-08-27 NOTE — Progress Notes (Signed)
Patient ID: Jeremiah Perkins, male   DOB: Jan 11, 1961, 55 y.o.   MRN: [id number]    Location:  [facility] Place of Service: OFFICE  Chief Complaint  Patient presents with  . Follow-up  . Flu Vaccine    refuse feels like he has a cold per patient    HPI:  55 yo male seen today for ED f/u. He presented to the ED on 9/27th for hemoptysis. None observed while in ED. No changes made to medical regimen. He has not noticed any further hemoptysis. He takes xeralto for atrial flutter. K+ 2.7 and he was Rx KCL supplement which he never filled.   He is c/a severe pain behind left knee. Hx baker's cyst that was sx resected in the past. No known trauma. Pain interrupts sleep and feels achy. Hx gout. Followed by rheum Dr Charlestine Night. No relief with 811m ibuprofen. Tried ice  He is c/a bruise in left 4th toenail x 2 mos that has not changed in size. No known trauma. No hx melanoma. Hx DM  Past Medical History:  Diagnosis Date  . Anxiety   . Arthritis    involving knees, ankles, back, elbows (01/02/2015)  . Asthma   . [place]rial flutter ([facility]   . Cholelithiasis   . Chronic back pain   . Chronic bronchitis ([facility]    "get it q yr" (01/02/2015)  . Congestive heart failure ([facility]    No echo data available. Myocardial perfusion 2005 with EF 61%, presumed diastolic HF   . COPD (chronic obstructive pulmonary disease) ([facility]   . Diabetic peripheral neuropathy ([facility]   . Diverticulosis    with history of diverticulitis in 10/2011  . GERD (gastroesophageal reflux disease)   . Gout   . Hyperlipidemia   . Hypertension   . Major depression   . Morbid obesity ([facility]   . Myocardial infarction    "one doctor said I did back in 2014"  . OSA (obstructive sleep apnea) 1998   "wear my CPAP a couple times/month" (01/02/2015)  . Pneumonia 2013; 2014  . Psychosexual dysfunction with inhibited sexual excitement   . Shortness of breath    "all the time" (07/26/2013)  . Type II diabetes mellitus ([facility] dx'd 1991   previously on  insulin in 2000 for 3-4 years (but was stopped because adamantly did not want to be on insulin)    Past Surgical History:  Procedure Laterality Date  . 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 ANKLE RECONSTRUCTION Right 1990's   2/2 trauma sustained after fall  . CARDIAC CATHETERIZATION  1990's; 07/2013  . CARDIOVERSION N/A 07/28/2013   Procedure: CARDIOVERSION;  Surgeon: DLarey Dresser MD;  [facility];  Service: Cardiovascular;  Laterality: N/A;  . CARDIOVERSION N/A 10/26/2013   Procedure: CARDIOVERSION;  Surgeon: TDarlin Coco MD;  [facility]  Service: Cardiovascular;  Laterality: N/A;  . CHOLECYSTECTOMY N/A 08/24/2015   Procedure: LAPAROSCOPIC CHOLECYSTECTOMY WITH INTRAOPERATIVE CHOLANGIOGRAM;  Surgeon: TJackolyn Confer MD;  Location: [facility];  Service: General;  Laterality: N/A;  . CYST REMOVAL LEG Left 1960's   back of  leg  . FINGER AMPUTATION    . LEFT HEART CATHETERIZATION WITH CORONARY ANGIOGRAM N/A 07/27/2013   Procedure: LEFT HEART CATHETERIZATION WITH CORONARY ANGIOGRAM;  Surgeon: Peter M JMartinique MD;  [facility];  Service: Cardiovascular;  Laterality: N/A;  . TEE WITHOUT CARDIOVERSION N/A 07/28/2013   Procedure: TRANSESOPHAGEAL ECHOCARDIOGRAM (TEE);  Surgeon: DLarey Dresser MD;  [facility]  Service: Cardiovascular;  Laterality: N/A;    Patient Care Team: [name], DO as PCP - General (Internal Medicine) [name], MD as Consulting Physician (Cardiology) Rutherford Guys, MD as Consulting Physician (Ophthalmology)  Social History   Social History  . Marital status: Married    Spouse name: N/A  . Number of children: 3  . Years of education: 14   Occupational History  . Now unemployed     Conservation officer, nature at [employer]  .  [employer]   Social History Main Topics  . Smoking status: Former Smoker    Packs/day: 0.12    Years: 10.00    Types: Cigarettes    Quit date: 09/13/2015  . Smokeless tobacco: Never Used      Comment: rarely.  . Alcohol use 0.0 oz/week     Comment: OCCASIONAL  . Drug use: No     Comment: LAST USED DEC 2015  . Sexual activity: Not on file   Other Topics Concern  . Not on file   Social History Narrative   Previously worked as Conservation officer, nature and lives at home with his wife. They each have children but none between the two of them.      Diet:      Do you drink/ eat things with caffeine?  Yes      Marital status:   Married                             What year were you married ? 2010      Do you live in a house, apartment,assistred living, condo, trailer, etc.)? House      Is it one or more stories? Yes      How many persons live in your home ? 3      Do you have any pets in your home ?(please list) Dog      Current or past profession: Aircraft Mechanic/Instructor      Do you exercise?  Yes                            Type & how often:  Golf,cut grass, walk      Do you have a living will? No      Do you have a DNR form?   No                    If not, do you want to discuss one?       Do you have signed POA?HPOA forms?   No              If so, please bring to your        appointment           reports that he quit smoking about a year ago. His smoking use included Cigarettes. He has a 1.20 pack-year smoking history. He has never used smokeless tobacco. He reports that he drinks alcohol. He reports that he does not use drugs.  Family History  Problem Relation Age of Onset  . Diabetes Mother   . CAD Mother 37    requiring quadruple bypass  . Hypertension Mother   . Alzheimer's disease Mother   . Stomach cancer Father   . Lung cancer Father     was a smoker  . Gout Father   . CAD Brother 38    requiring quadruple bypass  .  Seizures Brother   . Hypertension Brother   . Diabetes Maternal Grandmother   . Alzheimer's disease Maternal Grandmother   . Breast cancer Paternal Aunt   . Diabetes Maternal Aunt   . Seizures Paternal Uncle    Family Status    Relation Status  . Mother Deceased  . Father Deceased  . Brother Alive  . Brother Deceased  . Brother Deceased  . Brother Deceased  . Brother Deceased  . Son Alive  . Daughter Alive  . Daughter Alive  . Brother   . Brother   . Brother   . Maternal Grandmother   . Paternal Aunt   . Maternal Aunt   . Paternal Uncle      No Known Allergies  Medications: Patient's Medications  New Prescriptions   No medications on file  Previous Medications   ALLOPURINOL (ZYLOPRIM) 100 MG TABLET    Take 1 tablet (100 mg total) by mouth daily.   BUDESONIDE (PULMICORT) 0.5 MG/2ML NEBULIZER SOLUTION    Take 4 mLs (1 mg total) by nebulization 2 (two) times daily.   BUPROPION (WELLBUTRIN SR) 150 MG 12 HR TABLET    Take 1 tablet (150 mg total) by mouth 2 (two) times daily.   CALCIUM CARBONATE (TUMS - DOSED IN MG ELEMENTAL CALCIUM) 500 MG CHEWABLE TABLET    Chew 1 tablet by mouth as needed for indigestion or heartburn. Reported on 01/15/2016   CARVEDILOL (COREG) 12.5 MG TABLET    Take 1 tablet (12.5 mg total) by mouth 2 (two) times daily with a meal.   COLCHICINE 0.6 MG TABLET    Take one tablet by mouth once daily for gout prevention   DICLOFENAC SODIUM (VOLTAREN) 1 % GEL    Apply 2 g topically 4 (four) times daily.   FLUTICASONE (FLONASE) 50 MCG/ACT NASAL SPRAY    Use one spray in each nostril daily for seasonal allergy   FUROSEMIDE (LASIX) 80 MG TABLET    Take 80 mg by mouth daily.   GABAPENTIN (NEURONTIN) 600 MG TABLET    TAKE 1 TABLET (600MG) IN THE MORNING AND 2 TABLETS (1200MG) IN THE EVENING FOR PAINS   GLUCOSE BLOOD (ACCU-CHEK AVIVA PLUS) TEST STRIP    Use to check blood sugar four times daily. Dx: E11.8   INSULIN GLARGINE (LANTUS) 100 UNIT/ML INJECTION    Inject 10 Units into the skin every morning.   INSULIN PEN NEEDLE 31G X 8 MM MISC    Use daily with the administration of lantus E11.8   IPRATROPIUM (ATROVENT) 0.02 % NEBULIZER SOLUTION    Take 2.5 mLs (0.5 mg total) by nebulization 2 (two)  times daily.   LEVALBUTEROL (XOPENEX HFA) 45 MCG/ACT INHALER    Inhale 1-2 puffs into the lungs every 4 (four) hours as needed for wheezing. Reported on 12/13/2015   LEVALBUTEROL (XOPENEX) 0.63 MG/3ML NEBULIZER SOLUTION    Take 3 mLs (0.63 mg total) by nebulization every 4 (four) hours as needed for wheezing.   LOSARTAN (COZAAR) 25 MG TABLET    Take 0.5 tablets (12.5 mg total) by mouth daily. Reported on 12/13/2015   METOLAZONE (ZAROXOLYN) 2.5 MG TABLET    Take 2.5 mg by mouth 2 (two) times a week.   OXYCODONE (OXY IR/ROXICODONE) 5 MG IMMEDIATE RELEASE TABLET    Take 1-2 tablets (5-10 mg total) by mouth every 4 (four) hours as needed for moderate pain, severe pain or breakthrough pain.   POTASSIUM CHLORIDE SA (K-DUR,KLOR-CON) 20 MEQ TABLET    Take  1 tablet (20 mEq total) by mouth 2 (two) times daily.   SILDENAFIL (VIAGRA) 100 MG TABLET    Take 1 tablet (100 mg total) by mouth daily as needed for erectile dysfunction. Reported on 12/13/2015   XARELTO 20 MG TABS TABLET    TAKE 1 TABLET (20 MG TOTAL) BY MOUTH DAILY WITH SUPPER.  Modified Medications   No medications on file  Discontinued Medications   INSULIN GLARGINE (LANTUS SOLOSTAR) 100 UNIT/ML SOLOSTAR PEN    Inject 10 Units into the skin daily at 10 pm. E11.8    Review of Systems  Unable to perform ROS: Other (severe pain)    Vitals:   08/27/16 1114  BP: 122/78  Pulse: 94  Temp: 97.9 F (36.6 C)  TempSrc: Oral  SpO2: 96%  Height: '5\' 7"'  (1.702 m)   There is no height or weight on file to calculate BMI.  Physical Exam  Constitutional: He appears well-developed and well-nourished.  Looks uncomfortable in NAD. Lying on exam table on stomach, moaning  Cardiovascular: Normal rate.  An irregularly irregular rhythm present.  Murmur heard.  Systolic murmur is present with a grade of 1/6  +1 pitting LE edema b/l. No calf TTP  Pulmonary/Chest: Effort normal and breath sounds normal. No respiratory distress. He has no wheezes. He has no  rales. He exhibits no tenderness.  Musculoskeletal: He exhibits edema, tenderness and deformity.  Left knee with marked swelling and effusion anterior>posterior and reduced flexion/extension; no palpable popliteal mass/cyst  Neurological: He is alert.  Skin: Skin is warm and dry. No rash noted.  Left medial knee with hard palpable TTP cyst vs nodule. No streaking; left 4th toenail subungal hemorrhage at nail bed, NT  Psychiatric: He has a normal mood and affect. His behavior is normal.     Labs reviewed: Admission on 08/20/2016, Discharged on 08/20/2016  Component Date Value Ref Range Status  . Sodium 08/20/2016 135  135 - 145 mmol/L Final  . Potassium 08/20/2016 2.7* 3.5 - 5.1 mmol/L Final   Comment: CRITICAL RESULT CALLED TO, READ BACK BY AND VERIFIED WITH: NEALSON,T. RN '@[id number]'  ON 9.27.17 BY MCCOY,N.   . Chloride 08/20/2016 88* 101 - 111 mmol/L Final  . CO2 08/20/2016 34* 22 - 32 mmol/L Final  . Glucose, Bld 08/20/2016 183* 65 - 99 mg/dL Final  . BUN 08/20/2016 56* 6 - 20 mg/dL Final  . Creatinine, Ser 08/20/2016 1.68* 0.61 - 1.24 mg/dL Final  . Calcium 08/20/2016 9.1  8.9 - 10.3 mg/dL Final  . GFR calc non Af Amer 08/20/2016 44* >60 mL/min Final  . GFR calc Af Amer 08/20/2016 51* >60 mL/min Final   Comment: (NOTE) The eGFR has been calculated using the CKD EPI equation. This calculation has not been validated in all clinical situations. eGFR's persistently <60 mL/min signify possible Chronic Kidney Disease.   . Anion gap 08/20/2016 13  5 - 15 Final  . Troponin I 08/20/2016 0.09* <0.03 ng/mL Final   Comment: CRITICAL RESULT CALLED TO, READ BACK BY AND VERIFIED WITH: NEALSON,T. RN '@[id number]'  ON 9.27.17 BY MCCOY,N.    . B Natriuretic Peptide 08/20/2016 173.6* 0.0 - 100.0 pg/mL Final  . WBC 08/20/2016 6.7  4.0 - 10.5 K/uL Final  . RBC 08/20/2016 4.45  4.22 - 5.81 MIL/uL Final  . Hemoglobin 08/20/2016 13.6  13.0 - 17.0 g/dL Final  . HCT 08/20/2016 41.4  39.0 - 52.0 % Final  . MCV  08/20/2016 93.0  78.0 - 100.0 fL Final  . MCH  08/20/2016 30.6  26.0 - 34.0 pg Final  . MCHC 08/20/2016 32.9  30.0 - 36.0 g/dL Final  . RDW 08/20/2016 14.7  11.5 - 15.5 % Final  . Platelets 08/20/2016 273  150 - 400 K/uL Final  . Neutrophils Relative % 08/20/2016 62  % Final  . Neutro Abs 08/20/2016 4.2  1.7 - 7.7 K/uL Final  . Lymphocytes Relative 08/20/2016 27  % Final  . Lymphs Abs 08/20/2016 1.8  0.7 - 4.0 K/uL Final  . Monocytes Relative 08/20/2016 8  % Final  . Monocytes Absolute 08/20/2016 0.5  0.1 - 1.0 K/uL Final  . Eosinophils Relative 08/20/2016 3  % Final  . Eosinophils Absolute 08/20/2016 0.2  0.0 - 0.7 K/uL Final  . Basophils Relative 08/20/2016 0  % Final  . Basophils Absolute 08/20/2016 0.0  0.0 - 0.1 K/uL Final  . Troponin I 08/20/2016 0.08* <0.03 ng/mL Final  Abstract on 07/09/2016  Component Date Value Ref Range Status  . Hemoglobin 07/08/2016 13.7  13.5 - 17.5 g/dL Final  . HCT 07/08/2016 40* 41 - 53 % Final  . Platelets 07/08/2016 342  150 - 399 K/L Final  . WBC 07/08/2016 7.2  10^3/mL Final  . Glucose 07/08/2016 182  mg/dL Final  . BUN 07/08/2016 25* 4 - 21 mg/dL Final  . Creatinine 07/08/2016 1.2  0.6 - 1.3 mg/dL Final  . Potassium 07/08/2016 3.6  3.4 - 5.3 mmol/L Final  . Sodium 07/08/2016 140  137 - 147 mmol/L Final  . Alkaline Phosphatase 07/08/2016 88  25 - 125 U/L Final  . ALT 07/08/2016 13  10 - 40 U/L Final  . AST 07/08/2016 21  14 - 40 U/L Final  . Bilirubin, Total 07/08/2016 0.5  mg/dL Final    Dg Chest 2 View  Result Date: 08/20/2016 CLINICAL DATA:  Shortness of breath for 24 hours. History of hypertension, CHF. EXAM: CHEST  2 VIEW COMPARISON:  Chest radiograph Apr 09, 2016 FINDINGS: The cardiac silhouette is mildly enlarged, unchanged. Mediastinal silhouette is nonsuspicious. Similar pulmonary vascular congestion without pleural effusion or focal consolidation. Increased lung volumes with flattened hemidiaphragms. No pneumothorax. Large body  habitus. Moderate degenerative change of the thoracic spine. Surgical clips in the included right abdomen compatible with cholecystectomy. IMPRESSION: Similar cardiomegaly and pulmonary vascular congestion. COPD. Electronically Signed   By: Elon Alas M.D.   On: 08/20/2016 03:58     Assessment/Plan   ICD-9-CM ICD-10-CM   1. Acute pain of left knee 719.46 M25.562 DG Knee Complete 4 Views Left  2. Idiopathic chronic gout without tophus, unspecified site 274.02 [id number].00X0 Uric Acid     DG Knee Complete 4 Views Left  3. Hemoptysis 786.30 R04.2   4. Hypokalemia 276.8 E87.6 CMP with eGFR  5. Subungual contusion of toenail of left foot, initial encounter 924.3 S90.222A Ambulatory referral to Podiatry   Take pain medication as needed for pain  May take colcrys DAILY x 3 days for possible gout flare  Use ice as needed to left knee  Continue other medications as ordered  Get potassium script filled and take as directed for low potassium  Will call with lab results and referrals  Follow up as scheduled    Karin Pinedo S. Perlie Gold  [facility] [address] [place], [place] [address] [phone] Cell (Monday-Friday 8 AM - 5 PM) [phone] After 5 PM and follow prompts

## 2016-08-27 NOTE — Patient Instructions (Addendum)
Take pain medication as needed for pain  May take colcrys DAILY x 3 days for possible gout flare  Use ice as needed to left knee  Continue other medications as ordered  Get potassium script filled and take as directed for low potassium  Will call with lab results  Follow up as scheduled

## 2016-08-28 ENCOUNTER — Other Ambulatory Visit: Payer: Self-pay

## 2016-08-28 DIAGNOSIS — M25462 Effusion, left knee: Secondary | ICD-10-CM

## 2016-09-02 ENCOUNTER — Telehealth: Payer: Self-pay

## 2016-09-02 ENCOUNTER — Telehealth: Payer: Self-pay | Admitting: Cardiovascular Disease

## 2016-09-02 DIAGNOSIS — N5201 Erectile dysfunction due to arterial insufficiency: Secondary | ICD-10-CM | POA: Diagnosis not present

## 2016-09-02 NOTE — Telephone Encounter (Signed)
Patient calls today requesting a reorder of his Viagra through the [facility]. Phone number [phone] for [employer]. Patient ID# [id number]. Order # [id number] will be shipped in 7-10 business days.

## 2016-09-02 NOTE — Telephone Encounter (Signed)
MEDICATIONS HAS ALREADY BEEN ADDRESSED.

## 2016-09-03 ENCOUNTER — Other Ambulatory Visit: Payer: Self-pay | Admitting: Internal Medicine

## 2016-09-03 ENCOUNTER — Telehealth: Payer: Self-pay | Admitting: *Deleted

## 2016-09-04 NOTE — Telephone Encounter (Signed)
This was re-ordered 09/02/2016. He know it takes 7-10 days for it to arrive.

## 2016-09-05 ENCOUNTER — Telehealth: Payer: Self-pay

## 2016-09-05 NOTE — Telephone Encounter (Signed)
Patient notified by phone that his Viagra has arrived here.

## 2016-09-10 ENCOUNTER — Ambulatory Visit (INDEPENDENT_AMBULATORY_CARE_PROVIDER_SITE_OTHER): Payer: PPO | Admitting: Internal Medicine

## 2016-09-10 ENCOUNTER — Encounter: Payer: Self-pay | Admitting: Internal Medicine

## 2016-09-10 VITALS — BP 118/90 | HR 100 | Temp 98.2°F | Ht 67.0 in | Wt 269.2 lb

## 2016-09-10 DIAGNOSIS — M25562 Pain in left knee: Secondary | ICD-10-CM

## 2016-09-10 DIAGNOSIS — M1A00X0 Idiopathic chronic gout, unspecified site, without tophus (tophi): Secondary | ICD-10-CM | POA: Diagnosis not present

## 2016-09-10 DIAGNOSIS — I4892 Unspecified atrial flutter: Secondary | ICD-10-CM | POA: Diagnosis not present

## 2016-09-10 DIAGNOSIS — E876 Hypokalemia: Secondary | ICD-10-CM

## 2016-09-10 DIAGNOSIS — R17 Unspecified jaundice: Secondary | ICD-10-CM | POA: Diagnosis not present

## 2016-09-10 DIAGNOSIS — Z23 Encounter for immunization: Secondary | ICD-10-CM | POA: Diagnosis not present

## 2016-09-10 DIAGNOSIS — E118 Type 2 diabetes mellitus with unspecified complications: Secondary | ICD-10-CM

## 2016-09-10 DIAGNOSIS — Z794 Long term (current) use of insulin: Secondary | ICD-10-CM | POA: Diagnosis not present

## 2016-09-10 DIAGNOSIS — G894 Chronic pain syndrome: Secondary | ICD-10-CM | POA: Diagnosis not present

## 2016-09-10 DIAGNOSIS — I10 Essential (primary) hypertension: Secondary | ICD-10-CM | POA: Diagnosis not present

## 2016-09-10 DIAGNOSIS — S61012A Laceration without foreign body of left thumb without damage to nail, initial encounter: Secondary | ICD-10-CM

## 2016-09-10 NOTE — Patient Instructions (Signed)
Continue current medications as ordered  Clean thumb with warm water using antibacterial Dial soap and apply neosporin/topical antibiotic daily and cover with bandaid  Follow up with specialists as scheduled  Will call with lab results  Flu shot given today  Follow up in 3 mos for routine visit.

## 2016-09-10 NOTE — Progress Notes (Signed)
Patient ID: CLIFFARD HAIR, male   DOB: 19-Nov-1961, 55 y.o.   MRN: [id number]    Location:  [facility] Place of Service: OFFICE  Chief Complaint  Patient presents with  . Medical Management of Chronic Issues    3 month routine visit  . Flu Vaccine    requested    HPI:  55 yo male seen today for f/u. Left knee feels better today. Xray revealed large joint effusion. No appt with ortho until Oct 31st. He called rheumatology and was told to step up colchicine use for acute gout flare (take 2 tabs now then 1 tab every hr x 1 day -->diarrhea) and feels better.  Hypokalemia - takes KCl. K+ 3.4  Depression/anxiety - mood stable although he does get easily frustrated with his brother who has dementia. He is his primary caregiver. He takes wellbutrin SR  DM/neuropathy - BS not checked at home as he needs a new monitor and strips. No low BS reactions.  Metformin stopped due to renal insufficiency (Cr 1.48 --> 1.24). He is on lantus 14 units daily. gabapentin helps neuropathy. A1c 7.9%. He is on ARB  Arthritis/gout - pain improved. He takes roxicodone and uses voltaren gel. Occasional gout attacks. No longer on prednisone. Allopurinol for gout maintenance. Followed by rheumatology.  HTN/CHF/aflutter - rate controlled on coreg. Takes losartan for BP control. Followed by cardio. He takes xeralto. WEIGHT 269 LBS (prev 274 lb). He is still smoking cigs but markedly reduced with bupropion. Edema controlled on lasix and 2 times per week zaroxolyn. Home weights usually 259-264 lb.  Depression/anxiety - mood stable although he does get easily frustrated with his brother who has dementia. He is his primary caregiver  Hyperlipidemia - diet controlled. LDL 118  Asthma/OSA/COPD - stable on pulmicort/atroventxopenex nebs. He could not tolerate xopenex HFA  GERD - stable. He takes prn tums and zofran. He has frequent loose stools since his gallbladder surgery. He goes for BM about 32mn after meals. He has  increased ibuprofen use due to running out of roxicodone    Past Medical History:  Diagnosis Date  . Anxiety   . Arthritis    involving knees, ankles, back, elbows (01/02/2015)  . Asthma   . [place]rial flutter ([facility]   . Cholelithiasis   . Chronic back pain   . Chronic bronchitis ([facility]    "get it q yr" (01/02/2015)  . Congestive heart failure ([facility]    No echo data available. Myocardial perfusion 2005 with EF 61%, presumed diastolic HF   . COPD (chronic obstructive pulmonary disease) ([facility]   . Diabetic peripheral neuropathy ([facility]   . Diverticulosis    with history of diverticulitis in 10/2011  . GERD (gastroesophageal reflux disease)   . Gout   . Hyperlipidemia   . Hypertension   . Major depression   . Morbid obesity ([facility]   . Myocardial infarction    "one doctor said I did back in 2014"  . OSA (obstructive sleep apnea) 1998   "wear my CPAP a couple times/month" (01/02/2015)  . Pneumonia 2013; 2014  . Psychosexual dysfunction with inhibited sexual excitement   . Shortness of breath    "all the time" (07/26/2013)  . Type II diabetes mellitus ([place] dx'd 1991   previously on insulin in 2000 for 3-4 years (but was stopped because adamantly did not want to be on insulin)    Past Surgical History:  Procedure Laterality Date  . 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  ANKLE RECONSTRUCTION Right 1990's   2/2 trauma sustained after fall  . CARDIAC CATHETERIZATION  1990's; 07/2013  . CARDIOVERSION N/A 07/28/2013   Procedure: CARDIOVERSION;  Surgeon: Larey Dresser, MD;  Location: [facility] ENDOSCOPY;  Service: Cardiovascular;  Laterality: N/A;  . CARDIOVERSION N/A 10/26/2013   Procedure: CARDIOVERSION;  Surgeon: [name], MD;  Location: [facility];  Service: Cardiovascular;  Laterality: N/A;  . CHOLECYSTECTOMY N/A 08/24/2015   Procedure: LAPAROSCOPIC CHOLECYSTECTOMY WITH INTRAOPERATIVE CHOLANGIOGRAM;  Surgeon: Jackolyn Confer, MD;  Location: [facility] ORS;  Service: General;  Laterality: N/A;  .  CYST REMOVAL LEG Left 1960's   back of  leg  . FINGER AMPUTATION    . LEFT HEART CATHETERIZATION WITH CORONARY ANGIOGRAM N/A 07/27/2013   Procedure: LEFT HEART CATHETERIZATION WITH CORONARY ANGIOGRAM;  Surgeon: Peter M Martinique, MD;  Location: [facility] CATH LAB;  Service: Cardiovascular;  Laterality: N/A;  . TEE WITHOUT CARDIOVERSION N/A 07/28/2013   Procedure: TRANSESOPHAGEAL ECHOCARDIOGRAM (TEE);  Surgeon: Larey Dresser, MD;  Location: [facility] ENDOSCOPY;  Service: Cardiovascular;  Laterality: N/A;    Patient Care Team: [name], DO as PCP - General (Internal Medicine) [name], MD as Consulting Physician (Cardiology) Rutherford Guys, MD as Consulting Physician (Ophthalmology)  Social History   Social History  . Marital status: Married    Spouse name: N/A  . Number of children: 3  . Years of education: 14   Occupational History  . Now unemployed     Conservation officer, nature at [employer]  .  [employer]   Social History Main Topics  . Smoking status: Former Smoker    Packs/day: 0.12    Years: 10.00    Types: Cigarettes    Quit date: 09/13/2015  . Smokeless tobacco: Never Used     Comment: rarely.  . Alcohol use 0.0 oz/week     Comment: OCCASIONAL  . Drug use: No     Comment: LAST USED DEC 2015  . Sexual activity: Not on file   Other Topics Concern  . Not on file   Social History Narrative   Previously worked as Conservation officer, nature and lives at home with his wife. They each have children but none between the two of them.      Diet:      Do you drink/ eat things with caffeine?  Yes      Marital status:   Married                             What year were you married ? 2010      Do you live in a house, apartment,assistred living, condo, trailer, etc.)? House      Is it one or more stories? Yes      How many persons live in your home ? 3      Do you have any pets in your home ?(please list) Dog      Current or past profession: Aircraft Mechanic/Instructor      Do you exercise?  Yes                             Type & how often:  Golf,cut grass, walk      Do you have a living will? No      Do you have a DNR form?   No  If not, do you want to discuss one?       Do you have signed POA?HPOA forms?   No              If so, please bring to your        appointment           reports that he quit smoking about a year ago. His smoking use included Cigarettes. He has a 1.20 pack-year smoking history. He has never used smokeless tobacco. He reports that he drinks alcohol. He reports that he does not use drugs.  Family History  Problem Relation Age of Onset  . Diabetes Mother   . CAD Mother 37    requiring quadruple bypass  . Hypertension Mother   . Alzheimer's disease Mother   . Stomach cancer Father   . Lung cancer Father     was a smoker  . Gout Father   . CAD Brother 2    requiring quadruple bypass  . Seizures Brother   . Hypertension Brother   . Diabetes Maternal Grandmother   . Alzheimer's disease Maternal Grandmother   . Breast cancer Paternal Aunt   . Diabetes Maternal Aunt   . Seizures Paternal Uncle    Family Status  Relation Status  . Mother Deceased  . Father Deceased  . Brother Alive  . Brother Deceased  . Brother Deceased  . Brother Deceased  . Brother Deceased  . Son Alive  . Daughter Alive  . Daughter Alive  . Brother   . Brother   . Brother   . Maternal Grandmother   . Paternal Aunt   . Maternal Aunt   . Paternal Uncle      No Known Allergies  Medications: Patient's Medications  New Prescriptions   No medications on file  Previous Medications   ALLOPURINOL (ZYLOPRIM) 100 MG TABLET    Take 1 tablet (100 mg total) by mouth daily.   BUDESONIDE (PULMICORT) 0.5 MG/2ML NEBULIZER SOLUTION    Take 4 mLs (1 mg total) by nebulization 2 (two) times daily.   BUPROPION (WELLBUTRIN SR) 150 MG 12 HR TABLET    Take 1 tablet (150 mg total) by mouth 2 (two) times daily.   CALCIUM CARBONATE (TUMS - DOSED IN MG ELEMENTAL  CALCIUM) 500 MG CHEWABLE TABLET    Chew 1 tablet by mouth as needed for indigestion or heartburn. Reported on 01/15/2016   CARVEDILOL (COREG) 12.5 MG TABLET    Take 1 tablet (12.5 mg total) by mouth 2 (two) times daily with a meal.   COLCHICINE 0.6 MG TABLET    Take one tablet by mouth once daily for gout prevention   DICLOFENAC SODIUM (VOLTAREN) 1 % GEL    Apply 2 g topically 4 (four) times daily.   FLUTICASONE (FLONASE) 50 MCG/ACT NASAL SPRAY    Use one spray in each nostril daily for seasonal allergy   FUROSEMIDE (LASIX) 80 MG TABLET    Take 80 mg by mouth daily.   GABAPENTIN (NEURONTIN) 600 MG TABLET    TAKE ONE TABLET BY MOUTH EVERY MORNING AND TAKE TWO TABLETS BY MOUTH EVERY EVENING   GLUCOSE BLOOD (ACCU-CHEK AVIVA PLUS) TEST STRIP    Use to check blood sugar four times daily. Dx: E11.8   INSULIN GLARGINE (LANTUS) 100 UNIT/ML INJECTION    Inject 10 Units into the skin every morning.   INSULIN PEN NEEDLE 31G X 8 MM MISC    Use daily with the administration of  lantus E11.8   IPRATROPIUM (ATROVENT) 0.02 % NEBULIZER SOLUTION    Take 2.5 mLs (0.5 mg total) by nebulization 2 (two) times daily.   LEVALBUTEROL (XOPENEX HFA) 45 MCG/ACT INHALER    Inhale 1-2 puffs into the lungs every 4 (four) hours as needed for wheezing. Reported on 12/13/2015   LEVALBUTEROL (XOPENEX) 0.63 MG/3ML NEBULIZER SOLUTION    Take 3 mLs (0.63 mg total) by nebulization every 4 (four) hours as needed for wheezing.   LOSARTAN (COZAAR) 25 MG TABLET    Take 0.5 tablets (12.5 mg total) by mouth daily. Reported on 12/13/2015   METOLAZONE (ZAROXOLYN) 2.5 MG TABLET    Take 2.5 mg by mouth 2 (two) times a week.   OXYCODONE (OXY IR/ROXICODONE) 5 MG IMMEDIATE RELEASE TABLET    Take 1-2 tablets (5-10 mg total) by mouth every 4 (four) hours as needed for moderate pain, severe pain or breakthrough pain.   POTASSIUM CHLORIDE SA (K-DUR,KLOR-CON) 20 MEQ TABLET    Take 1 tablet (20 mEq total) by mouth 2 (two) times daily.   SILDENAFIL (VIAGRA) 100  MG TABLET    Take 1 tablet (100 mg total) by mouth daily as needed for erectile dysfunction. Reported on 12/13/2015   XARELTO 20 MG TABS TABLET    TAKE 1 TABLET (20 MG TOTAL) BY MOUTH DAILY WITH SUPPER.  Modified Medications   No medications on file  Discontinued Medications   No medications on file    Review of Systems  Vitals:   09/10/16 0957  BP: 118/90  Pulse: 100  Temp: 98.2 F (36.8 C)  TempSrc: Oral  SpO2: 98%  Weight: 269 lb 3.2 oz (122.1 kg)  Height: _0  (1.702 m)   Body mass index is 42.16 kg/m.  Physical Exam  Constitutional: He is oriented to person, place, and time. He appears well-developed and well-nourished.  Looks uncomfortable in NAD. Lying on exam table on stomach, moaning  HENT:  Mouth/Throat: Oropharynx is clear and moist.  Eyes: Pupils are equal, round, and reactive to light. No scleral icterus.  Neck: Neck supple. Carotid bruit is not present.  Cardiovascular: Normal rate and intact distal pulses.  An irregularly irregular rhythm present. Exam reveals no gallop and no friction rub.   Murmur heard.  Systolic murmur is present with a grade of 1/6  +1 pitting LE edema b/l. No calf TTP  Pulmonary/Chest: Effort normal and breath sounds normal. No respiratory distress. He has no wheezes. He has no rales. He exhibits no tenderness.  Abdominal: Soft. Bowel sounds are normal. He exhibits no distension, no abdominal bruit, no pulsatile midline mass and no mass. There is no hepatomegaly. There is no tenderness. There is no rebound and no guarding.  Musculoskeletal: He exhibits edema, tenderness and deformity.  Left knee with reduced swelling and effusion anterior>posterior and improved flexion/extension; no palpable popliteal mass/cyst  Lymphadenopathy:    He has no cervical adenopathy.  Neurological: He is alert and oriented to person, place, and time.  Skin: Skin is warm and dry. No rash noted.  Left medial knee with hard palpable TTP cyst vs nodule. No  streaking. Left thumb with very tiny laceration adjacent to nail but no nail involvement. No bleeding or d/c but TTP. No secondary signs of infection  Psychiatric: He has a normal mood and affect. His behavior is normal. Judgment and thought content normal.     Labs reviewed: Office Visit on 08/27/2016  Component Date Value Ref Range Status  . Sodium 08/27/2016 137  135 - 146 mmol/L Final  . Potassium 08/27/2016 3.4* 3.5 - 5.3 mmol/L Final  . Chloride 08/27/2016 92* 98 - 110 mmol/L Final  . CO2 08/27/2016 33* 20 - 31 mmol/L Final  . Glucose, Bld 08/27/2016 137* 65 - 99 mg/dL Final  . BUN 08/27/2016 27* 7 - 25 mg/dL Final  . Creat 08/27/2016 1.18  0.70 - 1.33 mg/dL Final   Comment:   For patients > or = 57 years of age: The upper reference limit for Creatinine is approximately 13% higher for people identified as African-American.     . Total Bilirubin 08/27/2016 1.6* 0.2 - 1.2 mg/dL Final  . Alkaline Phosphatase 08/27/2016 71  40 - 115 U/L Final  . AST 08/27/2016 25  10 - 35 U/L Final  . ALT 08/27/2016 16  9 - 46 U/L Final  . Total Protein 08/27/2016 6.7  6.1 - 8.1 g/dL Final  . Albumin 08/27/2016 3.5* 3.6 - 5.1 g/dL Final  . Calcium 08/27/2016 8.9  8.6 - 10.3 mg/dL Final  . GFR, Est African American 08/27/2016 79  >=60 mL/min Final  . GFR, Est Non African American 08/27/2016 68  >=60 mL/min Final  . Uric Acid, Serum 08/27/2016 4.6  4.0 - 8.0 mg/dL Final  Admission on 08/20/2016, Discharged on 08/20/2016  Component Date Value Ref Range Status  . Sodium 08/20/2016 135  135 - 145 mmol/L Final  . Potassium 08/20/2016 2.7* 3.5 - 5.1 mmol/L Final   Comment: CRITICAL RESULT CALLED TO, READ BACK BY AND VERIFIED WITH: NEALSON,T. RN _0  ON 9.27.17 BY MCCOY,N.   . Chloride 08/20/2016 88* 101 - 111 mmol/L Final  . CO2 08/20/2016 34* 22 - 32 mmol/L Final  . Glucose, Bld 08/20/2016 183* 65 - 99 mg/dL Final  . BUN 08/20/2016 56* 6 - 20 mg/dL Final  . Creatinine, Ser 08/20/2016 1.68* 0.61  - 1.24 mg/dL Final  . Calcium 08/20/2016 9.1  8.9 - 10.3 mg/dL Final  . GFR calc non Af Amer 08/20/2016 44* >60 mL/min Final  . GFR calc Af Amer 08/20/2016 51* >60 mL/min Final   Comment: (NOTE) The eGFR has been calculated using the CKD EPI equation. This calculation has not been validated in all clinical situations. eGFR's persistently <60 mL/min signify possible Chronic Kidney Disease.   . Anion gap 08/20/2016 13  5 - 15 Final  . Troponin I 08/20/2016 0.09* <0.03 ng/mL Final   Comment: CRITICAL RESULT CALLED TO, READ BACK BY AND VERIFIED WITH: NEALSON,T. RN _1  ON 9.27.17 BY MCCOY,N.    . B Natriuretic Peptide 08/20/2016 173.6* 0.0 - 100.0 pg/mL Final  . WBC 08/20/2016 6.7  4.0 - 10.5 K/uL Final  . RBC 08/20/2016 4.45  4.22 - 5.81 MIL/uL Final  . Hemoglobin 08/20/2016 13.6  13.0 - 17.0 g/dL Final  . HCT 08/20/2016 41.4  39.0 - 52.0 % Final  . MCV 08/20/2016 93.0  78.0 - 100.0 fL Final  . MCH 08/20/2016 30.6  26.0 - 34.0 pg Final  . MCHC 08/20/2016 32.9  30.0 - 36.0 g/dL Final  . RDW 08/20/2016 14.7  11.5 - 15.5 % Final  . Platelets 08/20/2016 273  150 - 400 K/uL Final  . Neutrophils Relative % 08/20/2016 62  % Final  . Neutro Abs 08/20/2016 4.2  1.7 - 7.7 K/uL Final  . Lymphocytes Relative 08/20/2016 27  % Final  . Lymphs Abs 08/20/2016 1.8  0.7 - 4.0 K/uL Final  . Monocytes Relative 08/20/2016 8  % Final  . Monocytes  Absolute 08/20/2016 0.5  0.1 - 1.0 K/uL Final  . Eosinophils Relative 08/20/2016 3  % Final  . Eosinophils Absolute 08/20/2016 0.2  0.0 - 0.7 K/uL Final  . Basophils Relative 08/20/2016 0  % Final  . Basophils Absolute 08/20/2016 0.0  0.0 - 0.1 K/uL Final  . Troponin I 08/20/2016 0.08* <0.03 ng/mL Final  Abstract on 07/09/2016  Component Date Value Ref Range Status  . Hemoglobin 07/08/2016 13.7  13.5 - 17.5 g/dL Final  . HCT 07/08/2016 40* 41 - 53 % Final  . Platelets 07/08/2016 342  150 - 399 K/L Final  . WBC 07/08/2016 7.2  10^3/mL Final  . Glucose  07/08/2016 182  mg/dL Final  . BUN 07/08/2016 25* 4 - 21 mg/dL Final  . Creatinine 07/08/2016 1.2  0.6 - 1.3 mg/dL Final  . Potassium 07/08/2016 3.6  3.4 - 5.3 mmol/L Final  . Sodium 07/08/2016 140  137 - 147 mmol/L Final  . Alkaline Phosphatase 07/08/2016 88  25 - 125 U/L Final  . ALT 07/08/2016 13  10 - 40 U/L Final  . AST 07/08/2016 21  14 - 40 U/L Final  . Bilirubin, Total 07/08/2016 0.5  mg/dL Final    Dg Chest 2 View  Result Date: 08/20/2016 CLINICAL DATA:  Shortness of breath for 24 hours. History of hypertension, CHF. EXAM: CHEST  2 VIEW COMPARISON:  Chest radiograph Apr 09, 2016 FINDINGS: The cardiac silhouette is mildly enlarged, unchanged. Mediastinal silhouette is nonsuspicious. Similar pulmonary vascular congestion without pleural effusion or focal consolidation. Increased lung volumes with flattened hemidiaphragms. No pneumothorax. Large body habitus. Moderate degenerative change of the thoracic spine. Surgical clips in the included right abdomen compatible with cholecystectomy. IMPRESSION: Similar cardiomegaly and pulmonary vascular congestion. COPD. Electronically Signed   By: Elon Alas M.D.   On: 08/20/2016 03:58   Dg Knee Complete 4 Views Left  Result Date: 08/27/2016 CLINICAL DATA:  Gout.  Pain . EXAM: LEFT KNEE - COMPLETE 4+ VIEW COMPARISON:  No recent prior. FINDINGS: No acute bony abnormality identified. No evidence of fracture or dislocation. Mild degenerative change. Chondrocalcinosis noted about the medial compartment. Prominent knee joint effusion. IMPRESSION: 1. Prominent knee joint effusion.  No acute abnormality. 2. Mild degenerative changes right knee. No acute abnormality identified. Electronically Signed   By: Marcello Moores  Register   On: 08/27/2016 13:34     Assessment/Plan   ICD-9-CM ICD-10-CM   1. Laceration of left thumb without foreign body without damage to nail, initial encounter 883.0 S61.012A   2. Left knee pain, unspecified chronicity 719.46 M25.562    3. Idiopathic chronic gout without tophus, unspecified site 274.02 [id number].[id number]   4. Type 2 diabetes mellitus with complication, with long-term current use of insulin (HCC) 250.90 E11.8 CMP with eGFR   V58.67 Z79.4 Lipid panel     Hemoglobin A1C     Microalbumin / creatinine urine ratio  5. Hypokalemia 276.8 E87.6 CMP with eGFR  6. Chronic pain syndrome 338.4 G89.4   7. Essential hypertension 401.9 I10   8. Atrial flutter, unspecified type ([place]) 427.32 I48.92   9. Total bilirubin, elevated 277.4 R17 CMP with eGFR  10. Encounter for immunization Z23 Z23 Flu Vaccine QUAD 36+ mos IM   Continue current medications as ordered  Clean thumb with warm water using antibacterial Dial soap and apply neosporin/topical antibiotic daily and cover with bandaid  Follow up with specialists as scheduled  Will call with lab results  Flu shot given today  Follow up in 3  mos for routine visit.    Fayetta Sorenson S. Perlie Gold  [facility] [address] [place],  [address] [phone] Cell (Monday-Friday 8 AM - 5 PM) [phone] After 5 PM and follow prompts

## 2016-09-15 ENCOUNTER — Ambulatory Visit: Payer: PPO | Admitting: Podiatry

## 2016-09-18 ENCOUNTER — Telehealth: Payer: Self-pay | Admitting: Internal Medicine

## 2016-09-18 NOTE — Telephone Encounter (Signed)
Lm for pt to sched AWV w/[facility] in Nov/Dec, or come in 9:45am 12/12/2016 before 10:30am f/u w/Dr Montez Morita; [email or web address]

## 2016-09-23 ENCOUNTER — Ambulatory Visit (INDEPENDENT_AMBULATORY_CARE_PROVIDER_SITE_OTHER): Payer: PPO | Admitting: Nurse Practitioner

## 2016-09-23 ENCOUNTER — Ambulatory Visit (INDEPENDENT_AMBULATORY_CARE_PROVIDER_SITE_OTHER): Payer: PPO | Admitting: Family

## 2016-09-23 ENCOUNTER — Encounter (INDEPENDENT_AMBULATORY_CARE_PROVIDER_SITE_OTHER): Payer: Self-pay | Admitting: Orthopedic Surgery

## 2016-09-23 VITALS — BP 128/86 | HR 97 | Temp 97.9°F | Ht 67.0 in | Wt 275.0 lb

## 2016-09-23 DIAGNOSIS — M1A09X0 Idiopathic chronic gout, multiple sites, without tophus (tophi): Secondary | ICD-10-CM

## 2016-09-23 DIAGNOSIS — M25562 Pain in left knee: Secondary | ICD-10-CM | POA: Diagnosis not present

## 2016-09-23 DIAGNOSIS — G8929 Other chronic pain: Secondary | ICD-10-CM

## 2016-09-23 DIAGNOSIS — J019 Acute sinusitis, unspecified: Secondary | ICD-10-CM | POA: Diagnosis not present

## 2016-09-23 MED ORDER — AMOXICILLIN 500 MG PO CAPS: 1000.0000 mg | ORAL_CAPSULE | Freq: Three times a day (TID) | ORAL | 0 refills | Status: DC

## 2016-09-23 NOTE — Progress Notes (Signed)
Careteam: Patient Care Team: Jeremiah Boys, DO as PCP - General (Internal Medicine) Cassell Clement, MD as Consulting Physician (Cardiology) Jethro Bolus, MD as Consulting Physician (Ophthalmology)  Advanced Directive information    No Known Allergies  Chief Complaint  Patient presents with  . Acute Visit    Sinus Infection. Blowing Green Congestion     HPI: Patient is a 55 y.o. male seen in the office today due to feeling bad. Reports he got his flu shot on the 18th. The next day he started to feel bad. Congestion, with runny nose, sniffles. His "cold" went away but he is still having a lot of congestion sinus pressure.  Having increase headache.  Has drainage coming from the eye.  When he blows his nose a long large amount of green sputum comes out. No fever or chills.  Feels poorly.  Been going on for almost 2 weeks. Took Cold and Sinus plus for a few days, nasal spray, and ibuprofen    Review of Systems:  Review of Systems  Constitutional: Positive for fatigue. Negative for chills and fever.  HENT: Positive for congestion, postnasal drip, sinus pressure and voice change. Negative for ear discharge, ear pain, rhinorrhea, sneezing and sore throat.   Respiratory: Positive for cough. Negative for shortness of breath.   Neurological: Positive for headaches.    Past Medical History:  Diagnosis Date  . Anxiety   . Arthritis    involving knees, ankles, back, elbows (01/02/2015)  . Asthma   . Atrial flutter (HCC)   . Cholelithiasis   . Chronic back pain   . Chronic bronchitis (HCC)    "get it q yr" (01/02/2015)  . Congestive heart failure (HCC)    No echo data available. Myocardial perfusion 2005 with EF 61%, presumed diastolic HF   . COPD (chronic obstructive pulmonary disease) (HCC)   . Diabetic peripheral neuropathy (HCC)   . Diverticulosis    with history of diverticulitis in 10/2011  . GERD (gastroesophageal reflux disease)   . Gout   . Hyperlipidemia   .  Hypertension   . Major depression   . Morbid obesity (HCC)   . Myocardial infarction    "one doctor said I did back in 2014"  . OSA (obstructive sleep apnea) 1998   "wear my CPAP a couple times/month" (01/02/2015)  . Pneumonia 2013; 2014  . Psychosexual dysfunction with inhibited sexual excitement   . Shortness of breath    "all the time" (07/26/2013)  . Type II diabetes mellitus (HCC) dx'd 1991   previously on insulin in 2000 for 3-4 years (but was stopped because adamantly did not want to be on insulin)   Past Surgical History:  Procedure Laterality Date  . 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 ANKLE RECONSTRUCTION Right 1990's   2/2 trauma sustained after fall  . CARDIAC CATHETERIZATION  1990's; 07/2013  . CARDIOVERSION N/A 07/28/2013   Procedure: CARDIOVERSION;  Surgeon: Laurey Morale, MD;  Location: [facility] ENDOSCOPY;  Service: Cardiovascular;  Laterality: N/A;  . CARDIOVERSION N/A 10/26/2013   Procedure: CARDIOVERSION;  Surgeon: Cassell Clement, MD;  Location: [facility] ENDOSCOPY;  Service: Cardiovascular;  Laterality: N/A;  . CHOLECYSTECTOMY N/A 08/24/2015   Procedure: LAPAROSCOPIC CHOLECYSTECTOMY WITH INTRAOPERATIVE CHOLANGIOGRAM;  Surgeon: Avel Peace, MD;  Location: [facility] ORS;  Service: General;  Laterality: N/A;  . CYST REMOVAL LEG Left 1960's   back of  leg  . FINGER AMPUTATION    . LEFT HEART CATHETERIZATION WITH CORONARY ANGIOGRAM  N/A 07/27/2013   Procedure: LEFT HEART CATHETERIZATION WITH CORONARY ANGIOGRAM;  Surgeon: Peter M Swaziland, MD;  Location: [facility] CATH LAB;  Service: Cardiovascular;  Laterality: N/A;  . TEE WITHOUT CARDIOVERSION N/A 07/28/2013   Procedure: TRANSESOPHAGEAL ECHOCARDIOGRAM (TEE);  Surgeon: Laurey Morale, MD;  Location: [facility] ENDOSCOPY;  Service: Cardiovascular;  Laterality: N/A;   Social History:   reports that he quit smoking about a year ago. His smoking use included Cigarettes. He has a 1.20 pack-year smoking history. He has never used smokeless tobacco. He reports  that he drinks alcohol. He reports that he does not use drugs.  Family History  Problem Relation Age of Onset  . Diabetes Mother   . CAD Mother 56    requiring quadruple bypass  . Hypertension Mother   . Alzheimer's disease Mother   . Stomach cancer Father   . Lung cancer Father     was a smoker  . Gout Father   . CAD Brother 48    requiring quadruple bypass  . Seizures Brother   . Hypertension Brother   . Diabetes Maternal Grandmother   . Alzheimer's disease Maternal Grandmother   . Breast cancer Paternal Aunt   . Diabetes Maternal Aunt   . Seizures Paternal Uncle     Medications: Patient's Medications  New Prescriptions   No medications on file  Previous Medications   ALLOPURINOL (ZYLOPRIM) 100 MG TABLET    Take 1 tablet (100 mg total) by mouth daily.   BUDESONIDE (PULMICORT) 0.5 MG/2ML NEBULIZER SOLUTION    Take 4 mLs (1 mg total) by nebulization 2 (two) times daily.   BUPROPION (WELLBUTRIN SR) 150 MG 12 HR TABLET    Take 1 tablet (150 mg total) by mouth 2 (two) times daily.   CALCIUM CARBONATE (TUMS - DOSED IN MG ELEMENTAL CALCIUM) 500 MG CHEWABLE TABLET    Chew 1 tablet by mouth as needed for indigestion or heartburn. Reported on 01/15/2016   CARVEDILOL (COREG) 12.5 MG TABLET    Take 1 tablet (12.5 mg total) by mouth 2 (two) times daily with a meal.   COLCHICINE 0.6 MG TABLET    Take one tablet by mouth once daily for gout prevention   DICLOFENAC SODIUM (VOLTAREN) 1 % GEL    Apply 2 g topically 4 (four) times daily.   FLUTICASONE (FLONASE) 50 MCG/ACT NASAL SPRAY    Use one spray in each nostril daily for seasonal allergy   FUROSEMIDE (LASIX) 80 MG TABLET    Take 80 mg by mouth daily.   GABAPENTIN (NEURONTIN) 600 MG TABLET    TAKE ONE TABLET BY MOUTH EVERY MORNING AND TAKE TWO TABLETS BY MOUTH EVERY EVENING   GLUCOSE BLOOD (ACCU-CHEK AVIVA PLUS) TEST STRIP    Use to check blood sugar four times daily. Dx: E11.8   INSULIN GLARGINE (LANTUS) 100 UNIT/ML INJECTION    Inject 10  Units into the skin every morning.   INSULIN PEN NEEDLE 31G X 8 MM MISC    Use daily with the administration of lantus E11.8   IPRATROPIUM (ATROVENT) 0.02 % NEBULIZER SOLUTION    Take 2.5 mLs (0.5 mg total) by nebulization 2 (two) times daily.   LEVALBUTEROL (XOPENEX HFA) 45 MCG/ACT INHALER    Inhale 1-2 puffs into the lungs every 4 (four) hours as needed for wheezing. Reported on 12/13/2015   LEVALBUTEROL (XOPENEX) 0.63 MG/3ML NEBULIZER SOLUTION    Take 3 mLs (0.63 mg total) by nebulization every 4 (four) hours as needed for wheezing.  LOSARTAN (COZAAR) 25 MG TABLET    Take 0.5 tablets (12.5 mg total) by mouth daily. Reported on 12/13/2015   METOLAZONE (ZAROXOLYN) 2.5 MG TABLET    Take 2.5 mg by mouth 2 (two) times a week.   OXYCODONE (OXY IR/ROXICODONE) 5 MG IMMEDIATE RELEASE TABLET    Take 1-2 tablets (5-10 mg total) by mouth every 4 (four) hours as needed for moderate pain, severe pain or breakthrough pain.   POTASSIUM CHLORIDE SA (K-DUR,KLOR-CON) 20 MEQ TABLET    Take 1 tablet (20 mEq total) by mouth 2 (two) times daily.   SILDENAFIL (VIAGRA) 100 MG TABLET    Take 1 tablet (100 mg total) by mouth daily as needed for erectile dysfunction. Reported on 12/13/2015   XARELTO 20 MG TABS TABLET    TAKE 1 TABLET (20 MG TOTAL) BY MOUTH DAILY WITH SUPPER.  Modified Medications   No medications on file  Discontinued Medications   No medications on file     Physical Exam:  Vitals:   09/23/16 1140  BP: 128/86  Pulse: 97  Temp: 97.9 F (36.6 C)  TempSrc: Oral  SpO2: 95%  Weight: 275 lb (124.7 kg)  Height:  (1.702 m)   Body mass index is 43.07 kg/m.  Physical Exam  Constitutional: He appears well-developed and well-nourished.  HENT:  Head: Normocephalic and atraumatic.  Right Ear: External ear normal.  Left Ear: External ear normal.  Nose: Mucosal edema present.  Mouth/Throat: Oropharynx is clear and moist. No oropharyngeal exudate.  Eyes: Conjunctivae and EOM are normal. Pupils  are equal, round, and reactive to light.  Cardiovascular: Normal rate.  An irregularly irregular rhythm present.  Murmur heard.  Systolic murmur is present with a grade of 1/6  Pulmonary/Chest: Effort normal and breath sounds normal. No respiratory distress. He has no wheezes. He has no rales. He exhibits no tenderness.  Neurological: He is alert.  Skin: Skin is warm and dry. No rash noted.  Psychiatric: He has a normal mood and affect. His behavior is normal.    Labs reviewed: Basic Metabolic Panel:  Recent Labs  09/81/19 1047 07/08/16 08/20/16 0606 08/27/16 1200  NA 135 140 135 137  K 3.7 3.6 2.7* 3.4*  CL 93*  --  88* 92*  CO2 23  --  34* 33*  GLUCOSE 119*  --  183* 137*  BUN 51* 25* 56* 27*  CREATININE 1.24 1.2 1.68* 1.18  CALCIUM 9.2  --  9.1 8.9   Liver Function Tests:  Recent Labs  12/14/15 1056 12/26/15 1541 07/08/16 08/27/16 1200  AST 40 52* 21 25  ALT 19 50* 13 16  ALKPHOS 56 112 88 71  BILITOT 1.0 1.0  --  1.6*  PROT 6.4* 7.5  --  6.7  ALBUMIN 2.8* 4.1  --  3.5*   No results for input(s): LIPASE, AMYLASE in the last 8760 hours. No results for input(s): AMMONIA in the last 8760 hours. CBC:  Recent Labs  12/14/15 1056 12/17/15 0530 12/18/15 0601 12/20/15 0713 07/08/16 08/20/16 0606  WBC 3.8* 7.8 8.3 10.8* 7.2 6.7  NEUTROABS 2.4 5.8  --   --   --  4.2  HGB 12.2* 14.3 13.5 14.2 13.7 13.6  HCT 37.2* 43.2 41.6 43.9 40* 41.4  MCV 91.2 90.2 90.4 90.1  --  93.0  PLT 183 283 293 414* 342 273   Lipid Panel:  Recent Labs  12/26/15 1541 04/04/16 1200  CHOL 197 196  HDL 30* 55  LDLCALC 94  118*  TRIG 364* 115  CHOLHDL 6.6* 3.6   TSH: No results for input(s): TSH in the last 8760 hours. A1C: Lab Results  Component Value Date   HGBA1C 7.9 (H) 04/04/2016     Assessment/Plan 1. Acute non-recurrent sinusitis, unspecified location - amoxicillin (AMOXIL) 500 MG capsule; Take 2 capsules (1,000 mg total) by mouth 3 (three) times daily.  Dispense:  42 capsule; Refill: 0 To take florastor (probiotic)  by mouth twice daily for Gut health Cont to use plain normal saline as needed for increase pressure and congestion mucinex DM by mouth twice daily with full glass of water to help with cough and congestion  Follow up in 1 week, pt would also like to discuss memory issues Shanda Bumps K. Biagio Borg  [facility] [phone] 8 am - 5 pm) [phone] (after hours)

## 2016-09-23 NOTE — Patient Instructions (Addendum)
Amoxicillin 1000 mg by mouth every 8 hours for 1 week for sinusitis  To take florastor (probiotic)  by mouth twice daily for Gut health Cont to use plain normal saline as needed for increase pressure and congestion mucinex DM by mouth twice daily with full glass of water to help with cough and congestion   Sinusitis, Adult Sinusitis is redness, soreness, and inflammation of the paranasal sinuses. Paranasal sinuses are air pockets within the bones of your face. They are located beneath your eyes, in the middle of your forehead, and above your eyes. In healthy paranasal sinuses, mucus is able to drain out, and air is able to circulate through them by way of your nose. However, when your paranasal sinuses are inflamed, mucus and air can become trapped. This can allow bacteria and other germs to grow and cause infection. Sinusitis can develop quickly and last only a short time (acute) or continue over a long period (chronic). Sinusitis that lasts for more than 12 weeks is considered chronic. CAUSES Causes of sinusitis include:  Allergies.  Structural abnormalities, such as displacement of the cartilage that separates your nostrils (deviated septum), which can decrease the air flow through your nose and sinuses and affect sinus drainage.  Functional abnormalities, such as when the small hairs (cilia) that line your sinuses and help remove mucus do not work properly or are not present. SIGNS AND SYMPTOMS Symptoms of acute and chronic sinusitis are the same. The primary symptoms are pain and pressure around the affected sinuses. Other symptoms include:  Upper toothache.  Earache.  Headache.  Bad breath.  Decreased sense of smell and taste.  A cough, which worsens when you are lying flat.  Fatigue.  Fever.  Thick drainage from your nose, which often is green and may contain pus (purulent).  Swelling and warmth over the affected sinuses. DIAGNOSIS Your health care provider will perform  a physical exam. During your exam, your health care provider may perform any of the following to help determine if you have acute sinusitis or chronic sinusitis:  Look in your nose for signs of abnormal growths in your nostrils (nasal polyps).  Tap over the affected sinus to check for signs of infection.  View the inside of your sinuses using an imaging device that has a light attached (endoscope). If your health care provider suspects that you have chronic sinusitis, one or more of the following tests may be recommended:  Allergy tests.  Nasal culture. A sample of mucus is taken from your nose, sent to a lab, and screened for bacteria.  Nasal cytology. A sample of mucus is taken from your nose and examined by your health care provider to determine if your sinusitis is related to an allergy. TREATMENT Most cases of acute sinusitis are related to a viral infection and will resolve on their own within 10 days. Sometimes, medicines are prescribed to help relieve symptoms of both acute and chronic sinusitis. These may include pain medicines, decongestants, nasal steroid sprays, or saline sprays. However, for sinusitis related to a bacterial infection, your health care provider will prescribe antibiotic medicines. These are medicines that will help kill the bacteria causing the infection. Rarely, sinusitis is caused by a fungal infection. In these cases, your health care provider will prescribe antifungal medicine. For some cases of chronic sinusitis, surgery is needed. Generally, these are cases in which sinusitis recurs more than 3 times per year, despite other treatments. HOME CARE INSTRUCTIONS  Drink plenty of water. Water helps thin  the mucus so your sinuses can drain more easily.  Use a humidifier.  Inhale steam 3-4 times a day (for example, sit in the bathroom with the shower running).  Apply a warm, moist washcloth to your face 3-4 times a day, or as directed by your health care  provider.  Use saline nasal sprays to help moisten and clean your sinuses.  Take medicines only as directed by your health care provider.  If you were prescribed either an antibiotic or antifungal medicine, finish it all even if you start to feel better. SEEK IMMEDIATE MEDICAL CARE IF:  You have increasing pain or severe headaches.  You have nausea, vomiting, or drowsiness.  You have swelling around your face.  You have vision problems.  You have a stiff neck.  You have difficulty breathing.   This information is not intended to replace advice given to you by your health care provider. Make sure you discuss any questions you have with your health care provider.   Document Released: 11/10/2005 Document Revised: 12/01/2014 Document Reviewed: 11/25/2011 Elsevier Interactive Patient Education [employer].

## 2016-09-23 NOTE — Progress Notes (Signed)
Office Visit Note   Patient: Jeremiah Perkins           Date of Birth: 06-22-1961           MRN: [id number] Visit Date: 09/23/2016              Requested by: Kirt Boys, DO [address] [place], [place] [address] PCP: Kirt Boys, DO   Assessment & Plan: Visit Diagnoses: No diagnosis found.  Plan: Continue with current gout regimen. We'll follow up office as needed.  Follow-Up Instructions: No Follow-up on file.   Orders:  No orders of the defined types were placed in this encounter.  No orders of the defined types were placed in this encounter.     Procedures: No procedures performed   Clinical Data: No additional findings.   Subjective: Chief Complaint  Patient presents with  . Left Knee - Pain    Patient presents today for left knee pain. He has had acute gout flare a couple weeks ago, he had taken ibuprofen and oxycodone without relief. His uric acid was elevated, uric acid 4.6 on 08/27/16. He has over this time frame developed a knot over left knee. Knot is on anterior knee. He has had diagnostic imaging showing effusion. He has had knee aspirated approximately 6 months ago Dr. Nolen Mu. He did not want to follow up with prior orthopedic.He does want a rheumatologist referral, Dr. Kellie Simmering won't accept his insurance any longer.  States having no pain today. He believes he might have had a gout flare last week. Review of recent labs reveal a normal uric acid.  Review of Systems  Constitutional: Negative for chills and fever.  Musculoskeletal: Negative for arthralgias, gait problem and joint swelling.  All other systems reviewed and are negative.    Objective: Physical Exam  Musculoskeletal:       Right knee: He exhibits no effusion.       Left knee: He exhibits no effusion.    Physical Exam  Constitutional: He is oriented to person, place, and time. He appears well-developed and well-nourished.  Pulmonary/Chest: Effort normal.  Neurological: He  is alert and oriented to person, place, and time.  Psychiatric: He has a normal mood and affect.  Nursing note reviewed.    Right Knee Exam   Other  Swelling: none Other tests: no effusion present   Left Knee Exam  Left knee exam is normal.  Range of Motion  The patient has normal left knee ROM.  Muscle Strength   The patient has normal left knee strength.  Other  Erythema: absent Sensation: normal Swelling: none Effusion: no effusion present  Comments:  No bakers cyst      Specialty Comments:  No specialty comments available.  Imaging: No results found.   PMFS History: Patient Active Problem List   Diagnosis Date Noted  . Pneumonia 04/09/2016  . Essential hypertension   . Elevated troponin I level   . COPD exacerbation (HCC) 12/14/2015  . CAP (community acquired pneumonia) 12/14/2015  . Asthma exacerbation 12/13/2015  . Elevated troponin 12/13/2015  . Acute on chronic systolic heart failure, NYHA class 3 (HCC) 12/13/2015  . Asthma, chronic 11/02/2015  . RUQ abdominal pain 07/25/2015  . Hypotension 07/25/2015  . Primary gout 05/31/2015  . Calculus of gallbladder w/o mention of cholecystitis or obstruction 05/30/2015  . Subclinical hypothyroidism 01/03/2015  . Chronic kidney disease (CKD), stage III (moderate) 01/03/2015  . Abdominal discomfort, epigastric 07/28/2014  . Lower back pain 02/27/2014  .  Healthcare maintenance 10/07/2013  . Long term current use of anticoagulant therapy 07/22/2013  . Atrial flutter (HCC) 07/15/2013  . Morbid obesity (HCC) 11/11/2012  . Arthritis   . Major depression   . Diabetic peripheral neuropathy (HCC)   . Psychosexual dysfunction with inhibited sexual excitement   . OSA (obstructive sleep apnea)   . Hypertension   . Chronic systolic heart failure (HCC)   . Gout   . Diabetes mellitus type 2, controlled, with complications (HCC) 11/30/1989   Past Medical History:  Diagnosis Date  . Anxiety   . Arthritis     involving knees, ankles, back, elbows (01/02/2015)  . Asthma   . Atrial flutter (HCC)   . Cholelithiasis   . Chronic back pain   . Chronic bronchitis (HCC)    "get it q yr" (01/02/2015)  . Congestive heart failure (HCC)    No echo data available. Myocardial perfusion 2005 with EF 61%, presumed diastolic HF   . COPD (chronic obstructive pulmonary disease) (HCC)   . Diabetic peripheral neuropathy (HCC)   . Diverticulosis    with history of diverticulitis in 10/2011  . GERD (gastroesophageal reflux disease)   . Gout   . Hyperlipidemia   . Hypertension   . Major depression   . Morbid obesity (HCC)   . Myocardial infarction    "one doctor said I did back in 2014"  . OSA (obstructive sleep apnea) 1998   "wear my CPAP a couple times/month" (01/02/2015)  . Pneumonia 2013; 2014  . Psychosexual dysfunction with inhibited sexual excitement   . Shortness of breath    "all the time" (07/26/2013)  . Type II diabetes mellitus (HCC) dx'd 1991   previously on insulin in 2000 for 3-4 years (but was stopped because adamantly did not want to be on insulin)    Family History  Problem Relation Age of Onset  . Diabetes Mother   . CAD Mother 5770    requiring quadruple bypass  . Hypertension Mother   . Alzheimer's disease Mother   . Stomach cancer Father   . Lung cancer Father     was a smoker  . Gout Father   . CAD Brother 48    requiring quadruple bypass  . Seizures Brother   . Hypertension Brother   . Diabetes Maternal Grandmother   . Alzheimer's disease Maternal Grandmother   . Breast cancer Paternal Aunt   . Diabetes Maternal Aunt   . Seizures Paternal Uncle     Past Surgical History:  Procedure Laterality Date  . 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 ANKLE RECONSTRUCTION Right 1990's   2/2 trauma sustained after fall  . CARDIAC CATHETERIZATION  1990's; 07/2013  . CARDIOVERSION N/A 07/28/2013   Procedure: CARDIOVERSION;  Surgeon: Laurey Moralealton S McLean, MD;  [facility];  Service:  Cardiovascular;  Laterality: N/A;  . CARDIOVERSION N/A 10/26/2013   Procedure: CARDIOVERSION;  Surgeon: Cassell Clementhomas Brackbill, MD;  [facility];  Service: Cardiovascular;  Laterality: N/A;  . CHOLECYSTECTOMY N/A 08/24/2015   Procedure: LAPAROSCOPIC CHOLECYSTECTOMY WITH INTRAOPERATIVE CHOLANGIOGRAM;  Surgeon: Avel Peaceodd Rosenbower, MD;  Location: [facility] ORS;  Service: General;  Laterality: N/A;  . CYST REMOVAL LEG Left 1960's   back of  leg  . FINGER AMPUTATION    . LEFT HEART CATHETERIZATION WITH CORONARY ANGIOGRAM N/A 07/27/2013   Procedure: LEFT HEART CATHETERIZATION WITH CORONARY ANGIOGRAM;  Surgeon: Peter M SwazilandJordan, MD;  [facility];  Service: Cardiovascular;  Laterality: N/A;  .  TEE WITHOUT CARDIOVERSION N/A 07/28/2013   Procedure: TRANSESOPHAGEAL ECHOCARDIOGRAM (TEE);  Surgeon: Laurey Morale, MD;  Location: [facility] ENDOSCOPY;  Service: Cardiovascular;  Laterality: N/A;   Social History   Occupational History  . Now unemployed     Barrister's clerk at [employer]  .  [employer]   Social History Main Topics  . Smoking status: Former Smoker    Packs/day: 0.12    Years: 10.00    Types: Cigarettes    Quit date: 09/13/2015  . Smokeless tobacco: Never Used     Comment: rarely.  . Alcohol use 0.0 oz/week     Comment: OCCASIONAL  . Drug use: No     Comment: LAST USED DEC 2015  . Sexual activity: Not on file

## 2016-09-30 ENCOUNTER — Encounter: Payer: Self-pay | Admitting: Nurse Practitioner

## 2016-09-30 ENCOUNTER — Ambulatory Visit (INDEPENDENT_AMBULATORY_CARE_PROVIDER_SITE_OTHER): Payer: PPO | Admitting: Nurse Practitioner

## 2016-09-30 VITALS — BP 122/78 | HR 94 | Temp 97.6°F | Resp 20 | Ht 67.0 in | Wt 280.6 lb

## 2016-09-30 DIAGNOSIS — E038 Other specified hypothyroidism: Secondary | ICD-10-CM

## 2016-09-30 DIAGNOSIS — E118 Type 2 diabetes mellitus with unspecified complications: Secondary | ICD-10-CM

## 2016-09-30 DIAGNOSIS — J019 Acute sinusitis, unspecified: Secondary | ICD-10-CM

## 2016-09-30 DIAGNOSIS — R413 Other amnesia: Secondary | ICD-10-CM | POA: Diagnosis not present

## 2016-09-30 DIAGNOSIS — E039 Hypothyroidism, unspecified: Secondary | ICD-10-CM | POA: Diagnosis not present

## 2016-09-30 DIAGNOSIS — F3342 Major depressive disorder, recurrent, in full remission: Secondary | ICD-10-CM

## 2016-09-30 MED ORDER — LEVALBUTEROL TARTRATE 45 MCG/ACT IN AERO
1.0000 | INHALATION_SPRAY | RESPIRATORY_TRACT | 2 refills | Status: AC | PRN
Start: 2016-09-30 — End: ?

## 2016-09-30 MED ORDER — METOLAZONE 2.5 MG PO TABS: 2.5000 mg | ORAL_TABLET | ORAL | 2 refills | Status: DC

## 2016-09-30 NOTE — Progress Notes (Signed)
Careteam: Patient Care Team: Jeremiah Boys, DO as PCP - General (Internal Medicine) Jeremiah Clement, MD as Consulting Physician (Cardiology) Jeremiah Bolus, MD as Consulting Physician (Ophthalmology)  Advanced Directive information Does patient have an advance directive?: No, Would patient like information on creating an advanced directive?: Yes - Educational materials given  No Known Allergies  Chief Complaint  Patient presents with  . Medical Management of Chronic Issues    1 week follow up on memory and sinuses. Passed clock drawing     HPI: Patient is a 55 y.o. male seen in the office today for follow up sinusitis and memory.  Pt with hx of uncontrolled DM, afib, depression, CKD, hypothyroid, CHF, gout Reports sinusitis has improved greatly. Feeling much better. Occasional sinus headache but much better Hard time remembering names, completing sentences. Forgets what he is taking about in the middle of a sentence. Noticing memory decline for the last 3 years.  No depression or anxiety.  A lot of memory loss in his family. Mother had dementia.  Someone told him once he may have had a stroke.   Review of Systems:  Review of Systems  Constitutional: Negative for activity change, appetite change, fatigue and unexpected weight change.  HENT: Negative for congestion and hearing loss.   Eyes: Negative.   Respiratory: Negative for cough and shortness of breath.   Cardiovascular: Negative for chest pain, palpitations and leg swelling.  Gastrointestinal: Negative for abdominal pain, constipation and diarrhea.  Genitourinary: Negative for difficulty urinating and dysuria.  Musculoskeletal: Negative for arthralgias and myalgias.  Skin: Negative for color change and wound.  Neurological: Negative for dizziness and weakness.  Psychiatric/Behavioral: Positive for confusion. Negative for agitation, behavioral problems, dysphoric mood and hallucinations.    Past Medical History:    Diagnosis Date  . Anxiety   . Arthritis    involving knees, ankles, back, elbows (01/02/2015)  . Asthma   . Atrial flutter (HCC)   . Cholelithiasis   . Chronic back pain   . Chronic bronchitis (HCC)    "get it q yr" (01/02/2015)  . Congestive heart failure (HCC)    No echo data available. Myocardial perfusion 2005 with EF 61%, presumed diastolic HF   . COPD (chronic obstructive pulmonary disease) (HCC)   . Diabetic peripheral neuropathy (HCC)   . Diverticulosis    with history of diverticulitis in 10/2011  . GERD (gastroesophageal reflux disease)   . Gout   . Hyperlipidemia   . Hypertension   . Major depression   . Morbid obesity (HCC)   . Myocardial infarction    "one doctor said I did back in 2014"  . OSA (obstructive sleep apnea) 1998   "wear my CPAP a couple times/month" (01/02/2015)  . Pneumonia 2013; 2014  . Psychosexual dysfunction with inhibited sexual excitement   . Shortness of breath    "all the time" (07/26/2013)  . Type II diabetes mellitus (HCC) dx'd 1991   previously on insulin in 2000 for 3-4 years (but was stopped because adamantly did not want to be on insulin)   Past Surgical History:  Procedure Laterality Date  . 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 ANKLE RECONSTRUCTION Right 1990's   2/2 trauma sustained after fall  . CARDIAC CATHETERIZATION  1990's; 07/2013  . CARDIOVERSION N/A 07/28/2013   Procedure: CARDIOVERSION;  Surgeon: Jeremiah Morale, MD;  Location: [facility] ENDOSCOPY;  Service: Cardiovascular;  Laterality: N/A;  . CARDIOVERSION N/A 10/26/2013   Procedure: CARDIOVERSION;  Surgeon: Jeremiah Clement, MD;  Location: [facility] ENDOSCOPY;  Service: Cardiovascular;  Laterality: N/A;  . CHOLECYSTECTOMY N/A 08/24/2015   Procedure: LAPAROSCOPIC CHOLECYSTECTOMY WITH INTRAOPERATIVE CHOLANGIOGRAM;  Surgeon: Jeremiah Peace, MD;  Location: [facility] ORS;  Service: General;  Laterality: N/A;  . CYST REMOVAL LEG Left 1960's   back of  leg  . FINGER AMPUTATION    . LEFT HEART  CATHETERIZATION WITH CORONARY ANGIOGRAM N/A 07/27/2013   Procedure: LEFT HEART CATHETERIZATION WITH CORONARY ANGIOGRAM;  Surgeon: Jeremiah M [place], MD;  Location: [facility] CATH LAB;  Service: Cardiovascular;  Laterality: N/A;  . TEE WITHOUT CARDIOVERSION N/A 07/28/2013   Procedure: TRANSESOPHAGEAL ECHOCARDIOGRAM (TEE);  Surgeon: Jeremiah Morale, MD;  Location: [facility] ENDOSCOPY;  Service: Cardiovascular;  Laterality: N/A;   Social History:   reports that he quit smoking about 12 months ago. His smoking use included Cigarettes. He has a 1.20 pack-year smoking history. He has never used smokeless tobacco. He reports that he drinks alcohol. He reports that he does not use drugs.  Family History  Problem Relation Age of Onset  . Diabetes Mother   . CAD Mother 67    requiring quadruple bypass  . Hypertension Mother   . Alzheimer's disease Mother   . Stomach cancer Father   . Lung cancer Father     was a smoker  . Gout Father   . CAD Brother 48    requiring quadruple bypass  . Seizures Brother   . Hypertension Brother   . Diabetes Maternal Grandmother   . Alzheimer's disease Maternal Grandmother   . Breast cancer Paternal Aunt   . Diabetes Maternal Aunt   . Seizures Paternal Uncle     Medications: Patient's Medications  New Prescriptions   No medications on file  Previous Medications   ALLOPURINOL (ZYLOPRIM) 100 MG TABLET    Take 1 tablet (100 mg total) by mouth daily.   AMOXICILLIN (AMOXIL) 500 MG CAPSULE    Take 2 capsules (1,000 mg total) by mouth 3 (three) times daily.   BUDESONIDE (PULMICORT) 0.5 MG/2ML NEBULIZER SOLUTION    Take 4 mLs (1 mg total) by nebulization 2 (two) times daily.   BUPROPION (WELLBUTRIN SR) 150 MG 12 HR TABLET    Take 1 tablet (150 mg total) by mouth 2 (two) times daily.   CALCIUM CARBONATE (TUMS - DOSED IN MG ELEMENTAL CALCIUM) 500 MG CHEWABLE TABLET    Chew 1 tablet by mouth as needed for indigestion or heartburn. Reported on 01/15/2016   CARVEDILOL (COREG) 12.5 MG TABLET     Take 1 tablet (12.5 mg total) by mouth 2 (two) times daily with a meal.   COLCHICINE 0.6 MG TABLET    Take one tablet by mouth once daily for gout prevention   DICLOFENAC SODIUM (VOLTAREN) 1 % GEL    Apply 2 g topically 4 (four) times daily.   FLUTICASONE (FLONASE) 50 MCG/ACT NASAL SPRAY    Use one spray in each nostril daily for seasonal allergy   FUROSEMIDE (LASIX) 80 MG TABLET    Take 80 mg by mouth daily.   GABAPENTIN (NEURONTIN) 600 MG TABLET    TAKE ONE TABLET BY MOUTH EVERY MORNING AND TAKE TWO TABLETS BY MOUTH EVERY EVENING   GLUCOSE BLOOD (ACCU-CHEK AVIVA PLUS) TEST STRIP    Use to check blood sugar four times daily. Dx: E11.8   INSULIN GLARGINE (LANTUS) 100 UNIT/ML INJECTION    Inject 10 Units into the skin every morning.   INSULIN PEN NEEDLE 31G X 8  MM MISC    Use daily with the administration of lantus E11.8   IPRATROPIUM (ATROVENT) 0.02 % NEBULIZER SOLUTION    Take 2.5 mLs (0.5 mg total) by nebulization 2 (two) times daily.   LEVALBUTEROL (XOPENEX HFA) 45 MCG/ACT INHALER    Inhale 1-2 puffs into the lungs every 4 (four) hours as needed for wheezing. Reported on 12/13/2015   LEVALBUTEROL (XOPENEX) 0.63 MG/3ML NEBULIZER SOLUTION    Take 3 mLs (0.63 mg total) by nebulization every 4 (four) hours as needed for wheezing.   LOSARTAN (COZAAR) 25 MG TABLET    Take 0.5 tablets (12.5 mg total) by mouth daily. Reported on 12/13/2015   METOLAZONE (ZAROXOLYN) 2.5 MG TABLET    Take 2.5 mg by mouth 2 (two) times a week.   OXYCODONE (OXY IR/ROXICODONE) 5 MG IMMEDIATE RELEASE TABLET    Take 1-2 tablets (5-10 mg total) by mouth every 4 (four) hours as needed for moderate pain, severe pain or breakthrough pain.   POTASSIUM CHLORIDE SA (K-DUR,KLOR-CON) 20 MEQ TABLET    Take 1 tablet (20 mEq total) by mouth 2 (two) times daily.   SILDENAFIL (VIAGRA) 100 MG TABLET    Take 1 tablet (100 mg total) by mouth daily as needed for erectile dysfunction. Reported on 12/13/2015   XARELTO 20 MG TABS TABLET    TAKE 1  TABLET (20 MG TOTAL) BY MOUTH DAILY WITH SUPPER.  Modified Medications   No medications on file  Discontinued Medications   No medications on file     Physical Exam:  Vitals:   09/30/16 1446  BP: 122/78  Pulse: 94  Resp: 20  Temp: 97.6 F (36.4 C)  TempSrc: Oral  SpO2: 99%  Weight: 280 lb 9.6 oz (127.3 kg)  Height:  (1.702 m)   Body mass index is 43.95 kg/m.  Physical Exam  Constitutional: He appears well-developed and well-nourished.  HENT:  Head: Normocephalic and atraumatic.  Right Ear: External ear normal.  Left Ear: External ear normal.  Nose: Mucosal edema present.  Mouth/Throat: Oropharynx is clear and moist. No oropharyngeal exudate.  Eyes: Conjunctivae and EOM are normal. Pupils are equal, round, and reactive to light.  Cardiovascular: Normal rate.  An irregularly irregular rhythm present.  Murmur heard.  Systolic murmur is present with a grade of 1/6  Pulmonary/Chest: Effort normal and breath sounds normal. No respiratory distress.  Abdominal: Soft. Bowel sounds are normal.  Neurological: He is alert.  Skin: Skin is warm and dry. No rash noted.  Psychiatric: He has a normal mood and affect. His behavior is normal.    Labs reviewed: Basic Metabolic Panel:  Recent Labs  40/98/11 1047 07/08/16 08/20/16 0606 08/27/16 1200  NA 135 140 135 137  K 3.7 3.6 2.7* 3.4*  CL 93*  --  88* 92*  CO2 23  --  34* 33*  GLUCOSE 119*  --  183* 137*  BUN 51* 25* 56* 27*  CREATININE 1.24 1.2 1.68* 1.18  CALCIUM 9.2  --  9.1 8.9   Liver Function Tests:  Recent Labs  12/14/15 1056 12/26/15 1541 07/08/16 08/27/16 1200  AST 40 52* 21 25  ALT 19 50* 13 16  ALKPHOS 56 112 88 71  BILITOT 1.0 1.0  --  1.6*  PROT 6.4* 7.5  --  6.7  ALBUMIN 2.8* 4.1  --  3.5*   No results for input(s): LIPASE, AMYLASE in the last 8760 hours. No results for input(s): AMMONIA in the last 8760 hours. CBC:  Recent Labs  12/14/15 1056 12/17/15 0530 12/18/15 0601  12/20/15 0713 07/08/16 08/20/16 0606  WBC 3.8* 7.8 8.3 10.8* 7.2 6.7  NEUTROABS 2.4 5.8  --   --   --  4.2  HGB 12.2* 14.3 13.5 14.2 13.7 13.6  HCT 37.2* 43.2 41.6 43.9 40* 41.4  MCV 91.2 90.2 90.4 90.1  --  93.0  PLT 183 283 293 414* 342 273   Lipid Panel:  Recent Labs  12/26/15 1541 04/04/16 1200  CHOL 197 196  HDL 30* 55  LDLCALC 94 118*  TRIG 364* 115  CHOLHDL 6.6* 3.6   TSH: No results for input(s): TSH in the last 8760 hours. A1C: Lab Results  Component Value Date   HGBA1C 7.9 (H) 04/04/2016     Assessment/Plan 1. Controlled type 2 diabetes mellitus with complication, without long-term current use of insulin (HCC) -recent A1c NOT at goal. Will follow up A1c at this time.  Cont medication and lifestyle modifications at this time.   2. Subclinical hypothyroidism No recent TSH, will follow   3. Recurrent major depressive disorder, in full remission (HCC) In remission on current medications.   4. Memory loss -worsening memory loss. Wife notices changes with recall and frequently forgets thoughts when in conversation. No unsafe behaviors. Will follow up labs and get CT scan at this time.  - CT Head Wo Contrast; Future - TSH - COMPLETE METABOLIC PANEL WITH GFR - CBC with Differential/Platelets - RPR - Vitamin B12  5. Acute non-recurrent sinusitis, unspecified location -resolved, completed amoxicillin with good effects     Jeremiah Perkins K. Biagio Borg  [facility] [phone] 8 am - 5 pm) [phone] (after hours)

## 2016-10-02 NOTE — Telephone Encounter (Signed)
Opened in error

## 2016-10-07 ENCOUNTER — Inpatient Hospital Stay: Admission: RE | Admit: 2016-10-07 | Payer: PPO | Source: Ambulatory Visit

## 2016-10-08 ENCOUNTER — Other Ambulatory Visit: Payer: Self-pay | Admitting: *Deleted

## 2016-10-08 MED ORDER — OXYCODONE HCL 5 MG PO TABS: 5.0000 mg | ORAL_TABLET | ORAL | 0 refills | Status: AC | PRN

## 2016-10-08 NOTE — Telephone Encounter (Signed)
Patient requested and will pick up 

## 2016-10-09 ENCOUNTER — Ambulatory Visit: Payer: Self-pay

## 2016-10-09 ENCOUNTER — Telehealth: Payer: Self-pay

## 2016-10-09 NOTE — Telephone Encounter (Signed)
I called patient to remind him that he has lab work that needs to be drawn. Patient scheduled lab appointment for today to have the labs done.

## 2016-10-14 ENCOUNTER — Other Ambulatory Visit: Payer: PPO

## 2016-10-14 DIAGNOSIS — E118 Type 2 diabetes mellitus with unspecified complications: Secondary | ICD-10-CM | POA: Diagnosis not present

## 2016-10-14 DIAGNOSIS — R413 Other amnesia: Secondary | ICD-10-CM | POA: Diagnosis not present

## 2016-10-14 LAB — CBC WITH DIFFERENTIAL/PLATELET
BASOS PCT: 0 %
Basophils Absolute: 0 cells/uL (ref 0–200)
EOS PCT: 5 %
Eosinophils Absolute: 380 cells/uL (ref 15–500)
HCT: 47.8 % (ref 38.5–50.0)
Hemoglobin: 15.9 g/dL (ref 13.2–17.1)
Lymphocytes Relative: 33 %
Lymphs Abs: 2508 cells/uL (ref 850–3900)
MCH: 30.3 pg (ref 27.0–33.0)
MCHC: 33.3 g/dL (ref 32.0–36.0)
MCV: 91.2 fL (ref 80.0–100.0)
MONOS PCT: 7 %
MPV: 9.9 fL (ref 7.5–12.5)
Monocytes Absolute: 532 cells/uL (ref 200–950)
NEUTROS ABS: 4180 {cells}/uL (ref 1500–7800)
Neutrophils Relative %: 55 %
PLATELETS: 336 10*3/uL (ref 140–400)
RBC: 5.24 MIL/uL (ref 4.20–5.80)
RDW: 16.7 % — ABNORMAL HIGH (ref 11.0–15.0)
WBC: 7.6 10*3/uL (ref 3.8–10.8)

## 2016-10-15 LAB — COMPLETE METABOLIC PANEL WITH GFR
ALBUMIN: 3.8 g/dL (ref 3.6–5.1)
ALK PHOS: 107 U/L (ref 40–115)
ALT: 22 U/L (ref 9–46)
AST: 39 U/L — ABNORMAL HIGH (ref 10–35)
BILIRUBIN TOTAL: 0.9 mg/dL (ref 0.2–1.2)
BUN: 59 mg/dL — ABNORMAL HIGH (ref 7–25)
CO2: 28 mmol/L (ref 20–31)
Calcium: 9.3 mg/dL (ref 8.6–10.3)
Chloride: 89 mmol/L — ABNORMAL LOW (ref 98–110)
Creat: 1.37 mg/dL — ABNORMAL HIGH (ref 0.70–1.33)
GFR, EST AFRICAN AMERICAN: 66 mL/min (ref >=60)
GFR, EST NON AFRICAN AMERICAN: 57 mL/min — AB (ref >=60)
Glucose, Bld: 172 mg/dL — ABNORMAL HIGH (ref 65–99)
Potassium: 3.2 mmol/L — ABNORMAL LOW (ref 3.5–5.3)
Sodium: 132 mmol/L — ABNORMAL LOW (ref 135–146)
TOTAL PROTEIN: 7.2 g/dL (ref 6.1–8.1)

## 2016-10-15 LAB — VITAMIN B12: VITAMIN B 12: 487 pg/mL (ref 200–1100)

## 2016-10-15 LAB — HEMOGLOBIN A1C
HEMOGLOBIN A1C: 8.2 % — AB (ref <5.7)
MEAN PLASMA GLUCOSE: 189 mg/dL

## 2016-10-15 LAB — RPR

## 2016-10-15 LAB — TSH: TSH: 6.72 m[IU]/L — AB (ref 0.40–4.50)

## 2016-10-20 ENCOUNTER — Other Ambulatory Visit: Payer: Self-pay

## 2016-10-20 MED ORDER — POTASSIUM CHLORIDE CRYS ER 20 MEQ PO TBCR: 20.0000 meq | EXTENDED_RELEASE_TABLET | Freq: Two times a day (BID) | ORAL | 3 refills | Status: DC

## 2016-10-21 ENCOUNTER — Other Ambulatory Visit: Payer: Self-pay

## 2016-10-21 DIAGNOSIS — R413 Other amnesia: Secondary | ICD-10-CM

## 2016-10-21 DIAGNOSIS — E876 Hypokalemia: Secondary | ICD-10-CM

## 2016-10-23 ENCOUNTER — Ambulatory Visit (INDEPENDENT_AMBULATORY_CARE_PROVIDER_SITE_OTHER): Payer: PPO | Admitting: Nurse Practitioner

## 2016-10-23 ENCOUNTER — Telehealth: Payer: Self-pay

## 2016-10-23 ENCOUNTER — Encounter: Payer: Self-pay | Admitting: Nurse Practitioner

## 2016-10-23 ENCOUNTER — Other Ambulatory Visit: Payer: Self-pay

## 2016-10-23 VITALS — BP 128/82 | HR 94 | Temp 97.7°F | Resp 18 | Ht 67.0 in | Wt 271.8 lb

## 2016-10-23 DIAGNOSIS — R413 Other amnesia: Secondary | ICD-10-CM

## 2016-10-23 DIAGNOSIS — N183 Chronic kidney disease, stage 3 unspecified: Secondary | ICD-10-CM

## 2016-10-23 DIAGNOSIS — G4733 Obstructive sleep apnea (adult) (pediatric): Secondary | ICD-10-CM

## 2016-10-23 DIAGNOSIS — E118 Type 2 diabetes mellitus with unspecified complications: Secondary | ICD-10-CM | POA: Diagnosis not present

## 2016-10-23 DIAGNOSIS — I5022 Chronic systolic (congestive) heart failure: Secondary | ICD-10-CM

## 2016-10-23 DIAGNOSIS — Z794 Long term (current) use of insulin: Secondary | ICD-10-CM | POA: Diagnosis not present

## 2016-10-23 DIAGNOSIS — E039 Hypothyroidism, unspecified: Secondary | ICD-10-CM

## 2016-10-23 DIAGNOSIS — E871 Hypo-osmolality and hyponatremia: Secondary | ICD-10-CM

## 2016-10-23 DIAGNOSIS — F3342 Major depressive disorder, recurrent, in full remission: Secondary | ICD-10-CM

## 2016-10-23 DIAGNOSIS — F528 Other sexual dysfunction not due to a substance or known physiological condition: Secondary | ICD-10-CM

## 2016-10-23 DIAGNOSIS — E1142 Type 2 diabetes mellitus with diabetic polyneuropathy: Secondary | ICD-10-CM | POA: Diagnosis not present

## 2016-10-23 DIAGNOSIS — E038 Other specified hypothyroidism: Secondary | ICD-10-CM

## 2016-10-23 MED ORDER — GLUCOSE BLOOD VI STRP: ORAL_STRIP | 11 refills | Status: AC

## 2016-10-23 MED ORDER — ACCU-CHEK AVIVA PLUS W/DEVICE KIT: PACK | 0 refills | Status: AC

## 2016-10-23 NOTE — Telephone Encounter (Signed)
I called patient to find out which glucose meter he used so that a prescription can be sent in for a new one. Patient stated that he had not been checking blood sugars because he broke his old meter.   Patient stated he was unsure of which meter he had in the past, so he was going to call his wife and find out. He stated that he would call me back.

## 2016-10-23 NOTE — Telephone Encounter (Signed)
Patient called back and stated that he was using the Accu chek [employer]iva glucose meter and test strips. A prescription was sent to the pharmacy for both the meter and strips.

## 2016-10-23 NOTE — Patient Instructions (Signed)
DASH Eating Plan DASH stands for "Dietary Approaches to Stop Hypertension." The DASH eating plan is a healthy eating plan that has been shown to reduce high blood pressure (hypertension). Additional health benefits may include reducing the risk of type 2 diabetes mellitus, heart disease, and stroke. The DASH eating plan may also help with weight loss. What do I need to know about the DASH eating plan? For the DASH eating plan, you will follow these general guidelines:  Choose foods with less than 150 milligrams of sodium per serving (as listed on the food label).  Use salt-free seasonings or herbs instead of table salt or sea salt.  Check with your health care provider or pharmacist before using salt substitutes.  Eat lower-sodium products. These are often labeled as "low-sodium" or "no salt added."  Eat fresh foods. Avoid eating a lot of canned foods.  Eat more vegetables, fruits, and low-fat dairy products.  Choose whole grains. Look for the word "whole" as the first word in the ingredient list.  Choose fish and skinless chicken or turkey more often than red meat. Limit fish, poultry, and meat to 6 oz (170 g) each day.  Limit sweets, desserts, sugars, and sugary drinks.  Choose heart-healthy fats.  Eat more home-cooked food and less restaurant, buffet, and fast food.  Limit fried foods.  Do not fry foods. Cook foods using methods such as baking, boiling, grilling, and broiling instead.  When eating at a restaurant, ask that your food be prepared with less salt, or no salt if possible. What foods can I eat? Seek help from a dietitian for individual calorie needs. Grains  Whole grain or whole wheat bread. Brown rice. Whole grain or whole wheat pasta. Quinoa, bulgur, and whole grain cereals. Low-sodium cereals. Corn or whole wheat flour tortillas. Whole grain cornbread. Whole grain crackers. Low-sodium crackers. Vegetables  Fresh or frozen vegetables (raw, steamed, roasted, or  grilled). Low-sodium or reduced-sodium tomato and vegetable juices. Low-sodium or reduced-sodium tomato sauce and paste. Low-sodium or reduced-sodium canned vegetables. Fruits  All fresh, canned (in natural juice), or frozen fruits. Meat and Other Protein Products  Ground beef (85% or leaner), grass-fed beef, or beef trimmed of fat. Skinless chicken or turkey. Ground chicken or turkey. Pork trimmed of fat. All fish and seafood. Eggs. Dried beans, peas, or lentils. Unsalted nuts and seeds. Unsalted canned beans. Dairy  Low-fat dairy products, such as skim or 1% milk, 2% or reduced-fat cheeses, low-fat ricotta or cottage cheese, or plain low-fat yogurt. Low-sodium or reduced-sodium cheeses. Fats and Oils  Tub margarines without trans fats. Light or reduced-fat mayonnaise and salad dressings (reduced sodium). Avocado. Safflower, olive, or canola oils. Natural peanut or almond butter. Other  Unsalted popcorn and pretzels. The items listed above may not be a complete list of recommended foods or beverages. Contact your dietitian for more options.  What foods are not recommended? Grains  White bread. White pasta. White rice. Refined cornbread. Bagels and croissants. Crackers that contain trans fat. Vegetables  Creamed or fried vegetables. Vegetables in a cheese sauce. Regular canned vegetables. Regular canned tomato sauce and paste. Regular tomato and vegetable juices. Fruits  Canned fruit in light or heavy syrup. Fruit juice. Meat and Other Protein Products  Fatty cuts of meat. Ribs, chicken wings, bacon, sausage, bologna, salami, chitterlings, fatback, hot dogs, bratwurst, and packaged luncheon meats. Salted nuts and seeds. Canned beans with salt. Dairy  Whole or 2% milk, cream, half-and-half, and cream cheese. Whole-fat or sweetened yogurt. Full-fat cheeses   or blue cheese. Nondairy creamers and whipped toppings. Processed cheese, cheese spreads, or cheese curds. Condiments  Onion and garlic  salt, seasoned salt, table salt, and sea salt. Canned and packaged gravies. Worcestershire sauce. Tartar sauce. Barbecue sauce. Teriyaki sauce. Soy sauce, including reduced sodium. Steak sauce. Fish sauce. Oyster sauce. Cocktail sauce. Horseradish. Ketchup and mustard. Meat flavorings and tenderizers. Bouillon cubes. Hot sauce. Tabasco sauce. Marinades. Taco seasonings. Relishes. Fats and Oils  Butter, stick margarine, lard, shortening, ghee, and bacon fat. Coconut, palm kernel, or palm oils. Regular salad dressings. Other  Pickles and olives. Salted popcorn and pretzels. The items listed above may not be a complete list of foods and beverages to avoid. Contact your dietitian for more information.  Where can I find more information? National Heart, Lung, and [employer] Institute: www.nhlbi.nih.gov/health/health-topics/topics/dash/ This information is not intended to replace advice given to you by your health care provider. Make sure you discuss any questions you have with your health care provider. Document Released: 10/30/2011 Document Revised: 04/17/2016 Document Reviewed: 09/14/2013 Elsevier Interactive Patient Education  2017 [employer]  

## 2016-10-23 NOTE — Progress Notes (Signed)
Careteam: Patient Care Team: Jeremiah Cranker, DO as PCP - General (Internal Medicine) [name], MD as Consulting Physician (Cardiology) Rutherford Guys, MD as Consulting Physician (Ophthalmology)  Advanced Directive information Does Patient Have a Medical Advance Directive?: No  No Known Allergies  Chief Complaint  Patient presents with  . Medical Management of Chronic Issues    Follow up and repeat labs     HPI: Patient is a 55 y.o. male seen in the office today for a follow-up and to repeat his labs.  He c/o of a headache and sore throat. He states his throat feels like it has a film over it and he has a bitter taste in his mouth. Took Benadryl this morning for his headache.   Has not been checking his blood sugar readings for 3-4 months as he no longer has a glucometer. His last one was dropped and it broke. The patient does not exercise. He does try to watch what he eats.   Pt was previously not taking potassium, currently taking  potassium 2 tablets once a day in the morning instead of 1 tablet twice a day as prescribed since 11/27.  Patient did not have CT scan completed. He states he cannot afford the $200 right here at Christmas time. He is interested in completing after the new year when he has some money. Still feels like he forgets things and forgets what he is saying in the middle of a sentence.   Under a lot of stress - he is his brother's guardian and he has Alzheimer's. Feels like Wellbutrin is effective.   Needs another rheumatologist as his is no longer covered by [facility] at the beginning of the year.   Reports he has not been sleeping well, that he tosses and turns all night long. He uses a CPAP nightly.   Drinks about 1 gallon of water/day. He reports he is supposed to have a fluid restriction and that he does not always follow it. Always feeling thirty, does not always limit sodium either.   Review of Systems:  Review of Systems    Constitutional: Negative for activity change, appetite change, fatigue and unexpected weight change.  HENT: Positive for rhinorrhea, sneezing and sore throat. Negative for congestion and hearing loss.   Eyes: Negative.  Negative for visual disturbance.  Respiratory: Positive for cough. Negative for shortness of breath.   Cardiovascular: Negative for chest pain, palpitations and leg swelling.  Gastrointestinal: Positive for constipation. Negative for abdominal pain and diarrhea.  Endocrine: Positive for polydipsia and polyuria.  Genitourinary: Negative for difficulty urinating and dysuria.  Musculoskeletal: Positive for back pain. Negative for arthralgias and myalgias.  Skin: Negative for color change and wound.  Neurological: Positive for headaches. Negative for dizziness, facial asymmetry, weakness and light-headedness.  Psychiatric/Behavioral: Positive for confusion. Negative for agitation, behavioral problems, dysphoric mood and hallucinations.    Past Medical History:  Diagnosis Date  . Anxiety   . Arthritis    involving knees, ankles, back, elbows (01/02/2015)  . Asthma   . Atrial flutter ([place])   . Cholelithiasis   . Chronic back pain   . Chronic bronchitis ([place])    "get it q yr" (01/02/2015)  . Congestive heart failure ([place])    No echo data available. Myocardial perfusion 2005 with EF 61%, presumed diastolic HF   . COPD (chronic obstructive pulmonary disease) ([place])   . Diabetic peripheral neuropathy ([place])   . Diverticulosis    with history of diverticulitis  in 10/2011  . GERD (gastroesophageal reflux disease)   . Gout   . Hyperlipidemia   . Hypertension   . Major depression   . Morbid obesity ([place])   . Myocardial infarction    "one doctor said I did back in 2014"  . OSA (obstructive sleep apnea) 1998   "wear my CPAP a couple times/month" (01/02/2015)  . Pneumonia 2013; 2014  . Psychosexual dysfunction with inhibited sexual excitement   . Shortness of breath    "all the  time" (07/26/2013)  . Type II diabetes mellitus ([place]) dx'd 1991   previously on insulin in 2000 for 3-4 years (but was stopped because adamantly did not want to be on insulin)   Past Surgical History:  Procedure Laterality Date  . 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 ANKLE RECONSTRUCTION Right 1990's   2/2 trauma sustained after fall  . CARDIAC CATHETERIZATION  1990's; 07/2013  . CARDIOVERSION N/A 07/28/2013   Procedure: CARDIOVERSION;  Surgeon: Larey Dresser, MD;  Location: [facility] ENDOSCOPY;  Service: Cardiovascular;  Laterality: N/A;  . CARDIOVERSION N/A 10/26/2013   Procedure: CARDIOVERSION;  Surgeon: [name], MD;  Location: [facility];  Service: Cardiovascular;  Laterality: N/A;  . CHOLECYSTECTOMY N/A 08/24/2015   Procedure: LAPAROSCOPIC CHOLECYSTECTOMY WITH INTRAOPERATIVE CHOLANGIOGRAM;  Surgeon: Jackolyn Confer, MD;  Location: [facility] ORS;  Service: General;  Laterality: N/A;  . CYST REMOVAL LEG Left 1960's   back of  leg  . FINGER AMPUTATION    . LEFT HEART CATHETERIZATION WITH CORONARY ANGIOGRAM N/A 07/27/2013   Procedure: LEFT HEART CATHETERIZATION WITH CORONARY ANGIOGRAM;  Surgeon: Peter M Martinique, MD;  Location: [facility] CATH LAB;  Service: Cardiovascular;  Laterality: N/A;  . TEE WITHOUT CARDIOVERSION N/A 07/28/2013   Procedure: TRANSESOPHAGEAL ECHOCARDIOGRAM (TEE);  Surgeon: Larey Dresser, MD;  Location: [facility];  Service: Cardiovascular;  Laterality: N/A;   Social History:   reports that he quit smoking about 13 months ago. His smoking use included Cigarettes. He has a 1.20 pack-year smoking history. He has never used smokeless tobacco. He reports that he drinks alcohol. He reports that he does not use drugs.  Family History  Problem Relation Age of Onset  . Diabetes Mother   . CAD Mother 26    requiring quadruple bypass  . Hypertension Mother   . Alzheimer's disease Mother   . Stomach cancer Father   . Lung cancer Father     was a smoker  . Gout Father   . CAD Brother  63    requiring quadruple bypass  . Seizures Brother   . Hypertension Brother   . Diabetes Maternal Grandmother   . Alzheimer's disease Maternal Grandmother   . Breast cancer Paternal Aunt   . Diabetes Maternal Aunt   . Seizures Paternal Uncle     Medications: Patient's Medications  New Prescriptions   No medications on file  Previous Medications   ALLOPURINOL (ZYLOPRIM) 100 MG TABLET    Take 1 tablet (100 mg total) by mouth daily.   BUDESONIDE (PULMICORT) 0.5 MG/2ML NEBULIZER SOLUTION    Take 4 mLs (1 mg total) by nebulization 2 (two) times daily.   BUPROPION (WELLBUTRIN SR) 150 MG 12 HR TABLET    Take 1 tablet (150 mg total) by mouth 2 (two) times daily.   CARVEDILOL (COREG) 12.5 MG TABLET    Take 1 tablet (12.5 mg total) by mouth 2 (two) times daily with a meal.   COLCHICINE 0.6 MG TABLET  Take one tablet by mouth once daily for gout prevention   DICLOFENAC SODIUM (VOLTAREN) 1 % GEL    Apply 2 g topically 4 (four) times daily.   FLUTICASONE (FLONASE) 50 MCG/ACT NASAL SPRAY    Use one spray in each nostril daily for seasonal allergy   FUROSEMIDE (LASIX) 80 MG TABLET    Take 80 mg by mouth daily.   GABAPENTIN (NEURONTIN) 600 MG TABLET    TAKE ONE TABLET BY MOUTH EVERY MORNING AND TAKE TWO TABLETS BY MOUTH EVERY EVENING   GLUCOSE BLOOD (ACCU-CHEK AVIVA PLUS) TEST STRIP    Use to check blood sugar four times daily. Dx: E11.8   INSULIN GLARGINE (LANTUS) 100 UNIT/ML INJECTION    Inject 10 Units into the skin every morning.   INSULIN PEN NEEDLE 31G X 8 MM MISC    Use daily with the administration of lantus E11.8   IPRATROPIUM (ATROVENT) 0.02 % NEBULIZER SOLUTION    Take 2.5 mLs (0.5 mg total) by nebulization 2 (two) times daily.   LEVALBUTEROL (XOPENEX HFA) 45 MCG/ACT INHALER    Inhale 1-2 puffs into the lungs every 4 (four) hours as needed for wheezing. Reported on 12/13/2015   LEVALBUTEROL (XOPENEX) 0.63 MG/3ML NEBULIZER SOLUTION    Take 3 mLs (0.63 mg total) by nebulization every 4  (four) hours as needed for wheezing.   LOSARTAN (COZAAR) 25 MG TABLET    Take 0.5 tablets (12.5 mg total) by mouth daily. Reported on 12/13/2015   METOLAZONE (ZAROXOLYN) 2.5 MG TABLET    Take 1 tablet (2.5 mg total) by mouth 2 (two) times a week.   OXYCODONE (OXY IR/ROXICODONE) 5 MG IMMEDIATE RELEASE TABLET    Take 1-2 tablets (5-10 mg total) by mouth every 4 (four) hours as needed for moderate pain, severe pain or breakthrough pain.   POTASSIUM CHLORIDE SA (K-DUR,KLOR-CON) 20 MEQ TABLET    Take 1 tablet (20 mEq total) by mouth 2 (two) times daily.   SILDENAFIL (VIAGRA) 100 MG TABLET    Take 1 tablet (100 mg total) by mouth daily as needed for erectile dysfunction. Reported on 12/13/2015   XARELTO 20 MG TABS TABLET    TAKE 1 TABLET (20 MG TOTAL) BY MOUTH DAILY WITH SUPPER.  Modified Medications   No medications on file  Discontinued Medications   AMOXICILLIN (AMOXIL) 500 MG CAPSULE    Take 2 capsules (1,000 mg total) by mouth 3 (three) times daily.   CALCIUM CARBONATE (TUMS - DOSED IN MG ELEMENTAL CALCIUM) 500 MG CHEWABLE TABLET    Chew 1 tablet by mouth as needed for indigestion or heartburn. Reported on 01/15/2016     Physical Exam:  Vitals:   10/23/16 1030  BP: 128/82  Pulse: 94  Resp: 18  Temp: 97.7 F (36.5 C)  TempSrc: Oral  SpO2: 96%  Weight: 271 lb 12.8 oz (123.3 kg)  Height: '5\' 7"'  (1.702 m)   Body mass index is 42.57 kg/m.  Physical Exam  Constitutional: He appears well-developed and well-nourished.  HENT:  Head: Normocephalic and atraumatic.  Mouth/Throat: Oropharynx is clear and moist. No oropharyngeal exudate.  Eyes: Conjunctivae and EOM are normal. Pupils are equal, round, and reactive to light.  Neck: Normal range of motion. Neck supple.  Cardiovascular: Normal rate.  An irregularly irregular rhythm present.  Murmur heard.  Systolic murmur is present with a grade of 1/6  Pulmonary/Chest: Effort normal and breath sounds normal. No respiratory distress.  Abdominal:  Soft. Bowel sounds are normal.  Musculoskeletal: He  exhibits no edema.  Neurological: He is alert.  Skin: Skin is warm and dry. No rash noted.  Psychiatric:  Patient kept his eyes closed most of the time in the room.     Labs reviewed: Basic Metabolic Panel:  Recent Labs  08/20/16 0606 08/27/16 1200 10/14/16 0930  NA 135 137 132*  K 2.7* 3.4* 3.2*  CL 88* 92* 89*  CO2 34* 33* 28  GLUCOSE 183* 137* 172*  BUN 56* 27* 59*  CREATININE 1.68* 1.18 1.37*  CALCIUM 9.1 8.9 9.3  TSH  --   --  6.72*   Liver Function Tests:  Recent Labs  12/26/15 1541 07/08/16 08/27/16 1200 10/14/16 0930  AST 52* 21 25 39*  ALT 50* '13 16 22  ' ALKPHOS 112 88 71 107  BILITOT 1.0  --  1.6* 0.9  PROT 7.5  --  6.7 7.2  ALBUMIN 4.1  --  3.5* 3.8   No results for input(s): LIPASE, AMYLASE in the last 8760 hours. No results for input(s): AMMONIA in the last 8760 hours. CBC:  Recent Labs  12/17/15 0530  12/20/15 0713 07/08/16 08/20/16 0606 10/14/16 0930  WBC 7.8  < > 10.8* 7.2 6.7 7.6  NEUTROABS 5.8  --   --   --  4.2 4,180  HGB 14.3  < > 14.2 13.7 13.6 15.9  HCT 43.2  < > 43.9 40* 41.4 47.8  MCV 90.2  < > 90.1  --  93.0 91.2  PLT 283  < > 414* 342 273 336  < > = values in this interval not displayed. Lipid Panel:  Recent Labs  12/26/15 1541 04/04/16 1200  CHOL 197 196  HDL 30* 55  LDLCALC 94 118*  TRIG 364* 115  CHOLHDL 6.6* 3.6   TSH:  Recent Labs  10/14/16 0930  TSH 6.72*   A1C: Lab Results  Component Value Date   HGBA1C 8.2 (H) 10/14/2016     Assessment/Plan 1. Chronic systolic heart failure (HCC) - no swelling and shortness of breath. - BMP with eGFR to evaluate electrolyte levels -- on 11/21 Na 132, K 3.2 - pt is overdue for cardiology follow up, encouraged to make an appointment with his cardiologist. - Continue current medication regimen. - to adhere to fluid restriction and restricted sodium diet as per cardiologist.   2. Controlled type 2 diabetes  mellitus with complication, without long-term current use of insulin ([place]) - Uncontrolled, dietary modifications and exercise encouraged.  - Glucometer ordered.  - Continue Lantus.  3. Diabetic peripheral neuropathy ([place]) - Educated about the importance of proper diabetic foot care.  4. Subclinical hypothyroidism - TSH 6.72 on 11/21, continue to monitor.   5. OSA (obstructive sleep apnea) - Continue using CPAP nightly.   6. Chronic kidney disease (CKD), stage III (moderate) - BMP today, will notify if changes need to be made to medications based on labs today.  7. Memory loss - Mild memory loss continues. Patient is under a lot of stress. - Not able to afford CT scan at this time due to the holidays but will be willing after the new year when he has some money.   8. Recurrent major depressive disorder, in full remission ([place]) - Currently controlled. Continue Wellbutrin.  9. Psychosexual dysfunction with inhibited sexual excitement - Needs paperwork filled out and sent to [place] by PCP for Viagra. He forgot to bring the paperwork today, he will bring it back by and drop it off.   10. Hyponatremia - Patient  with previous low sodium with increase thirst and urination, will follow up BMP today and obtain urine osmolality and urine sodium for further evaluation.   11. Hypokalemia -previously not complaint with potassium, currently taking at this time. Educated on need to for potassium  -cont potassium 20 meq twice daily and follow up [place]. Harle Battiest  [facility] [phone] 8 am - 5 pm) [phone] (after hours)

## 2016-10-23 NOTE — Telephone Encounter (Signed)
A prescription was sent to pharmacy for a new glucose meter and test strips.   Patient has been notified.

## 2016-10-24 LAB — BASIC METABOLIC PANEL WITH GFR
BUN: 35 mg/dL — AB (ref 7–25)
CHLORIDE: 97 mmol/L — AB (ref 98–110)
CO2: 30 mmol/L (ref 20–31)
Calcium: 8.7 mg/dL (ref 8.6–10.3)
Creat: 1.16 mg/dL (ref 0.70–1.33)
GFR, EST AFRICAN AMERICAN: 80 mL/min (ref >=60)
GFR, EST NON AFRICAN AMERICAN: 70 mL/min (ref >=60)
Glucose, Bld: 219 mg/dL — ABNORMAL HIGH (ref 65–99)
POTASSIUM: 3.9 mmol/L (ref 3.5–5.3)
Sodium: 136 mmol/L (ref 135–146)

## 2016-10-24 LAB — MICROALBUMIN / CREATININE URINE RATIO
Creatinine, Urine: 34 mg/dL (ref 20–370)
MICROALB UR: 2.3 mg/dL
Microalb Creat Ratio: 68 mcg/mg creat — ABNORMAL HIGH (ref <30)

## 2016-10-24 LAB — OSMOLALITY, URINE: Osmolality, Ur: 309 mOsm/kg (ref 50–1200)

## 2016-10-24 LAB — SODIUM, URINE, RANDOM: SODIUM UR: 79 mmol/L (ref 28–272)

## 2016-10-25 ENCOUNTER — Inpatient Hospital Stay (HOSPITAL_COMMUNITY)
Admission: EM | Admit: 2016-10-25 | Discharge: 2016-11-01 | DRG: 871 | Disposition: A | Discharge status: Home Health | Payer: PPO | Attending: Internal Medicine | Admitting: Internal Medicine

## 2016-10-25 ENCOUNTER — Encounter (HOSPITAL_COMMUNITY): Payer: Self-pay | Admitting: *Deleted

## 2016-10-25 ENCOUNTER — Emergency Department (HOSPITAL_COMMUNITY): Payer: PPO

## 2016-10-25 DIAGNOSIS — E1142 Type 2 diabetes mellitus with diabetic polyneuropathy: Secondary | ICD-10-CM | POA: Diagnosis present

## 2016-10-25 DIAGNOSIS — E118 Type 2 diabetes mellitus with unspecified complications: Secondary | ICD-10-CM | POA: Diagnosis not present

## 2016-10-25 DIAGNOSIS — Z7901 Long term (current) use of anticoagulants: Secondary | ICD-10-CM

## 2016-10-25 DIAGNOSIS — N179 Acute kidney failure, unspecified: Secondary | ICD-10-CM | POA: Diagnosis present

## 2016-10-25 DIAGNOSIS — R0602 Shortness of breath: Secondary | ICD-10-CM | POA: Diagnosis not present

## 2016-10-25 DIAGNOSIS — E785 Hyperlipidemia, unspecified: Secondary | ICD-10-CM | POA: Diagnosis present

## 2016-10-25 DIAGNOSIS — I10 Essential (primary) hypertension: Secondary | ICD-10-CM | POA: Diagnosis present

## 2016-10-25 DIAGNOSIS — I252 Old myocardial infarction: Secondary | ICD-10-CM

## 2016-10-25 DIAGNOSIS — Z9049 Acquired absence of other specified parts of digestive tract: Secondary | ICD-10-CM

## 2016-10-25 DIAGNOSIS — A419 Sepsis, unspecified organism: Secondary | ICD-10-CM | POA: Diagnosis not present

## 2016-10-25 DIAGNOSIS — I130 Hypertensive heart and chronic kidney disease with heart failure and stage 1 through stage 4 chronic kidney disease, or unspecified chronic kidney disease: Secondary | ICD-10-CM | POA: Diagnosis not present

## 2016-10-25 DIAGNOSIS — I4891 Unspecified atrial fibrillation: Secondary | ICD-10-CM | POA: Diagnosis not present

## 2016-10-25 DIAGNOSIS — E1122 Type 2 diabetes mellitus with diabetic chronic kidney disease: Secondary | ICD-10-CM | POA: Diagnosis present

## 2016-10-25 DIAGNOSIS — N183 Chronic kidney disease, stage 3 unspecified: Secondary | ICD-10-CM | POA: Diagnosis present

## 2016-10-25 DIAGNOSIS — Z801 Family history of malignant neoplasm of trachea, bronchus and lung: Secondary | ICD-10-CM

## 2016-10-25 DIAGNOSIS — Z7982 Long term (current) use of aspirin: Secondary | ICD-10-CM | POA: Diagnosis not present

## 2016-10-25 DIAGNOSIS — K219 Gastro-esophageal reflux disease without esophagitis: Secondary | ICD-10-CM | POA: Diagnosis not present

## 2016-10-25 DIAGNOSIS — I2510 Atherosclerotic heart disease of native coronary artery without angina pectoris: Secondary | ICD-10-CM | POA: Diagnosis present

## 2016-10-25 DIAGNOSIS — I5023 Acute on chronic systolic (congestive) heart failure: Secondary | ICD-10-CM | POA: Diagnosis present

## 2016-10-25 DIAGNOSIS — R6521 Severe sepsis with septic shock: Secondary | ICD-10-CM | POA: Diagnosis not present

## 2016-10-25 DIAGNOSIS — F419 Anxiety disorder, unspecified: Secondary | ICD-10-CM | POA: Diagnosis present

## 2016-10-25 DIAGNOSIS — A4151 Sepsis due to Escherichia coli [E. coli]: Secondary | ICD-10-CM | POA: Diagnosis not present

## 2016-10-25 DIAGNOSIS — Z7951 Long term (current) use of inhaled steroids: Secondary | ICD-10-CM

## 2016-10-25 DIAGNOSIS — M25461 Effusion, right knee: Secondary | ICD-10-CM

## 2016-10-25 DIAGNOSIS — Z79899 Other long term (current) drug therapy: Secondary | ICD-10-CM | POA: Diagnosis not present

## 2016-10-25 DIAGNOSIS — Z6841 Body Mass Index (BMI) 40.0 and over, adult: Secondary | ICD-10-CM | POA: Diagnosis not present

## 2016-10-25 DIAGNOSIS — M25561 Pain in right knee: Secondary | ICD-10-CM

## 2016-10-25 DIAGNOSIS — M10061 Idiopathic gout, right knee: Secondary | ICD-10-CM | POA: Diagnosis present

## 2016-10-25 DIAGNOSIS — Z800 Family history of malignant neoplasm of digestive organs: Secondary | ICD-10-CM

## 2016-10-25 DIAGNOSIS — I959 Hypotension, unspecified: Secondary | ICD-10-CM | POA: Diagnosis not present

## 2016-10-25 DIAGNOSIS — G4733 Obstructive sleep apnea (adult) (pediatric): Secondary | ICD-10-CM | POA: Diagnosis present

## 2016-10-25 DIAGNOSIS — I5022 Chronic systolic (congestive) heart failure: Secondary | ICD-10-CM | POA: Diagnosis not present

## 2016-10-25 DIAGNOSIS — Z8701 Personal history of pneumonia (recurrent): Secondary | ICD-10-CM

## 2016-10-25 DIAGNOSIS — J449 Chronic obstructive pulmonary disease, unspecified: Secondary | ICD-10-CM | POA: Diagnosis not present

## 2016-10-25 DIAGNOSIS — Z87891 Personal history of nicotine dependence: Secondary | ICD-10-CM

## 2016-10-25 DIAGNOSIS — E871 Hypo-osmolality and hyponatremia: Secondary | ICD-10-CM | POA: Diagnosis not present

## 2016-10-25 DIAGNOSIS — I429 Cardiomyopathy, unspecified: Secondary | ICD-10-CM | POA: Diagnosis present

## 2016-10-25 DIAGNOSIS — N12 Tubulo-interstitial nephritis, not specified as acute or chronic: Secondary | ICD-10-CM | POA: Diagnosis present

## 2016-10-25 DIAGNOSIS — I4892 Unspecified atrial flutter: Secondary | ICD-10-CM | POA: Diagnosis present

## 2016-10-25 DIAGNOSIS — R05 Cough: Secondary | ICD-10-CM | POA: Diagnosis not present

## 2016-10-25 DIAGNOSIS — Z803 Family history of malignant neoplasm of breast: Secondary | ICD-10-CM

## 2016-10-25 DIAGNOSIS — J9601 Acute respiratory failure with hypoxia: Secondary | ICD-10-CM | POA: Diagnosis present

## 2016-10-25 DIAGNOSIS — Z8249 Family history of ischemic heart disease and other diseases of the circulatory system: Secondary | ICD-10-CM

## 2016-10-25 DIAGNOSIS — Z820 Family history of epilepsy and other diseases of the nervous system: Secondary | ICD-10-CM

## 2016-10-25 DIAGNOSIS — Z833 Family history of diabetes mellitus: Secondary | ICD-10-CM

## 2016-10-25 LAB — CBC WITH DIFFERENTIAL/PLATELET
Basophils Absolute: 0 K/uL (ref 0.0–0.1)
Basophils Relative: 0 %
Eosinophils Absolute: 0.1 K/uL (ref 0.0–0.7)
Eosinophils Relative: 0 %
HCT: 43.8 % (ref 39.0–52.0)
Hemoglobin: 14.6 g/dL (ref 13.0–17.0)
Lymphocytes Relative: 7 %
Lymphs Abs: 1.2 K/uL (ref 0.7–4.0)
MCH: 30.4 pg (ref 26.0–34.0)
MCHC: 33.3 g/dL (ref 30.0–36.0)
MCV: 91.1 fL (ref 78.0–100.0)
Monocytes Absolute: 0.7 K/uL (ref 0.1–1.0)
Monocytes Relative: 4 %
Neutro Abs: 15.3 K/uL — ABNORMAL HIGH (ref 1.7–7.7)
Neutrophils Relative %: 89 %
Platelets: 264 K/uL (ref 150–400)
RBC: 4.81 MIL/uL (ref 4.22–5.81)
RDW: 15.6 % — ABNORMAL HIGH (ref 11.5–15.5)
WBC: 17.3 K/uL — ABNORMAL HIGH (ref 4.0–10.5)

## 2016-10-25 LAB — COMPREHENSIVE METABOLIC PANEL WITH GFR
ALT: 20 U/L (ref 17–63)
AST: 27 U/L (ref 15–41)
Albumin: 3 g/dL — ABNORMAL LOW (ref 3.5–5.0)
Alkaline Phosphatase: 80 U/L (ref 38–126)
Anion gap: 13 (ref 5–15)
BUN: 21 mg/dL — ABNORMAL HIGH (ref 6–20)
CO2: 24 mmol/L (ref 22–32)
Calcium: 9.2 mg/dL (ref 8.9–10.3)
Chloride: 96 mmol/L — ABNORMAL LOW (ref 101–111)
Creatinine, Ser: 1.27 mg/dL — ABNORMAL HIGH (ref 0.61–1.24)
GFR calc Af Amer: >60 mL/min
GFR calc non Af Amer: >60 mL/min
Glucose, Bld: 195 mg/dL — ABNORMAL HIGH (ref 65–99)
Potassium: 4.9 mmol/L (ref 3.5–5.1)
Sodium: 133 mmol/L — ABNORMAL LOW (ref 135–145)
Total Bilirubin: 2.1 mg/dL — ABNORMAL HIGH (ref 0.3–1.2)
Total Protein: 7.6 g/dL (ref 6.5–8.1)

## 2016-10-25 LAB — CBG MONITORING, ED: GLUCOSE-CAPILLARY: 199 mg/dL — AB (ref 65–99)

## 2016-10-25 LAB — I-STAT VENOUS BLOOD GAS, ED
Acid-base deficit: 1 mmol/L (ref 0.0–2.0)
Bicarbonate: 22.4 mmol/L (ref 20.0–28.0)
O2 SAT: 81 %
PCO2 VEN: 32.8 mmHg — AB (ref 44.0–60.0)
TCO2: 23 mmol/L (ref 0–100)
pH, Ven: 7.442 — ABNORMAL HIGH (ref 7.250–7.430)
pO2, Ven: 43 mmHg (ref 32.0–45.0)

## 2016-10-25 LAB — URINALYSIS, ROUTINE W REFLEX MICROSCOPIC
Glucose, UA: NEGATIVE mg/dL
Ketones, ur: 15 mg/dL — AB
Nitrite: POSITIVE — AB
Protein, ur: 100 mg/dL — AB
Specific Gravity, Urine: 1.021 (ref 1.005–1.030)
pH: 5 (ref 5.0–8.0)

## 2016-10-25 LAB — URINE MICROSCOPIC-ADD ON

## 2016-10-25 LAB — PROTIME-INR
INR: 1.29
Prothrombin Time: 16.2 s — ABNORMAL HIGH (ref 11.4–15.2)

## 2016-10-25 LAB — I-STAT TROPONIN, ED: Troponin i, poc: 0.07 ng/mL (ref 0.00–0.08)

## 2016-10-25 LAB — I-STAT CG4 LACTIC ACID, ED
Lactic Acid, Venous: 2.34 mmol/L (ref 0.5–1.9)
Lactic Acid, Venous: 1.77 mmol/L (ref 0.5–1.9)

## 2016-10-25 LAB — BRAIN NATRIURETIC PEPTIDE: B Natriuretic Peptide: 282.8 pg/mL — ABNORMAL HIGH (ref 0.0–100.0)

## 2016-10-25 LAB — BETA-HYDROXYBUTYRIC ACID: Beta-Hydroxybutyric Acid: 0.34 mmol/L — ABNORMAL HIGH (ref 0.05–0.27)

## 2016-10-25 MED ORDER — ACETAMINOPHEN 500 MG PO TABS
1000.0000 mg | ORAL_TABLET | Freq: Once | ORAL | Status: AC
  Administered 2016-10-25: 1000 mg via ORAL
  Filled 2016-10-25: qty 2

## 2016-10-25 MED ORDER — SODIUM CHLORIDE 0.9 % IV BOLUS (SEPSIS)
500.0000 mL | Freq: Once | INTRAVENOUS | Status: AC
  Administered 2016-10-25: 500 mL via INTRAVENOUS

## 2016-10-25 MED ORDER — NOREPINEPHRINE BITARTRATE 1 MG/ML IV SOLN
0.0000 ug/min | Freq: Once | INTRAVENOUS | Status: AC
  Administered 2016-10-25: 2 ug/min via INTRAVENOUS
  Filled 2016-10-25: qty 4

## 2016-10-25 MED ORDER — DEXTROSE 5 % IV SOLN
1.0000 g | Freq: Once | INTRAVENOUS | Status: AC
  Administered 2016-10-25: 1 g via INTRAVENOUS
  Filled 2016-10-25: qty 10

## 2016-10-25 MED ORDER — IBUPROFEN 400 MG PO TABS
600.0000 mg | ORAL_TABLET | Freq: Once | ORAL | Status: AC
  Administered 2016-10-25: 600 mg via ORAL

## 2016-10-25 MED ORDER — ONDANSETRON HCL 4 MG/2ML IJ SOLN
4.0000 mg | Freq: Once | INTRAMUSCULAR | Status: AC
  Administered 2016-10-25: 4 mg via INTRAVENOUS
  Filled 2016-10-25: qty 2

## 2016-10-25 MED ORDER — IBUPROFEN 200 MG PO TABS
ORAL_TABLET | ORAL | Status: AC
  Filled 2016-10-25: qty 3

## 2016-10-25 MED ORDER — SODIUM CHLORIDE 0.9 % IV BOLUS (SEPSIS)
1000.0000 mL | Freq: Once | INTRAVENOUS | Status: AC
  Administered 2016-10-25: 1000 mL via INTRAVENOUS

## 2016-10-25 NOTE — ED Triage Notes (Addendum)
Pt reports that he began feeling bad last night. Has sob, no relief with nebulizer at home. Reports chills, n/v and painful urination.

## 2016-10-25 NOTE — ED Provider Notes (Signed)
I saw and evaluated the patient, reviewed the resident's note and I agree with the findings and plan with the following exceptions.   55 yo with history of diabetes, CHF presents to the emergency department with couple days of worsening shortness of breath, sore throat, weakness, decreased appetite and today started having dysuria.  On exam patient appears sleepy, tachypnea, tachycardic, slightly hypoxic. I right greater than left CVA tenderness but no abdominal symptoms. Skin is warm and rectal temperatures 104. Concern for sepsis likely urinary source at this point so we'll start Rocephin. Blood pressure okay, lactic acid slightly elevated but less than 4 so will not try to get 30 mL/kg at this time secondary to his fluid overload already. We'll also evaluate for DKA and admit likely to stepdown versus telemetry.  Patient become hypotensive and required multiple fluid boluses without sustained improvement in BP so started on levophed and admitted to CCM.   CRITICAL CARE Performed by: Marily Memos Total critical care time: 45 minutes Critical care time was exclusive of separately billable procedures and treating other patients. Critical care was necessary to treat or prevent imminent or life-threatening deterioration. Critical care was time spent personally by me on the following activities: development of treatment plan with patient and/or surrogate as well as nursing, discussions with consultants, evaluation of patient's response to treatment, examination of patient, obtaining history from patient or surrogate, ordering and performing treatments and interventions, ordering and review of laboratory studies, ordering and review of radiographic studies, pulse oximetry and re-evaluation of patient's condition.    EKG Interpretation  Date/Time:  Saturday October 25 2016 15:54:33 EST Ventricular Rate:  147 PR Interval:    QRS Duration: 56 QT Interval:  312 QTC Calculation: 488 R  Axis:   46 Text Interpretation:  Atrial flutter with variable A-V block with premature ventricular or aberrantly conducted complexes Low voltage QRS Possible Anterolateral infarct , age undetermined Abnormal ECG Confirmed by [facility] MD, [place] [phone]) on 10/25/2016 4:22:03 PM         Marily Memos, MD 10/26/16 [phone]

## 2016-10-25 NOTE — ED Notes (Signed)
Admitting MD recommending BP higher before transfer to SDU. Pt. Given another 500 bolus.

## 2016-10-25 NOTE — ED Notes (Signed)
SDU RN advising BP too low for floor to take pt. EDP made aware. Another bolus ordered.

## 2016-10-25 NOTE — ED Notes (Signed)
Pt. Placed on 2L nasal cannula at this time.

## 2016-10-25 NOTE — ED Notes (Signed)
Pt. Given food with EDP permission.  

## 2016-10-25 NOTE — ED Notes (Signed)
EDP at bedside  

## 2016-10-25 NOTE — ED Notes (Signed)
CBG 199; RN notified

## 2016-10-25 NOTE — ED Notes (Signed)
Pt. Refusing 2nd IV at this time.

## 2016-10-25 NOTE — ED Provider Notes (Signed)
[facility] DEPT Provider Note   CSN: [id number] Arrival date & time: 10/25/16  1543     History   Chief Complaint Chief Complaint  Patient presents with  . Shortness of Breath    HPI Jeremiah Perkins is a 55 y.o. male.   Shortness of Breath  This is a new problem. The average episode lasts 2 days. The problem occurs continuously.The current episode started 2 days ago. The problem has been gradually worsening. Associated symptoms include a fever, cough, orthopnea and abdominal pain. Pertinent negatives include no sore throat, no ear pain, no chest pain, no vomiting and no rash. It is unknown what precipitated the problem. He has tried nothing for the symptoms. The treatment provided no relief. Associated medical issues include COPD, CAD, heart failure and past MI. Associated medical issues do not include chronic lung disease.    Past Medical History:  Diagnosis Date  . Anxiety   . Arthritis    involving knees, ankles, back, elbows (01/02/2015)  . Asthma   . Atrial flutter ([place])   . Cholelithiasis   . Chronic back pain   . Chronic bronchitis ([place])    "get it q yr" (01/02/2015)  . Congestive heart failure ([place])    No echo data available. Myocardial perfusion 2005 with EF 61%, presumed diastolic HF   . COPD (chronic obstructive pulmonary disease) ([place])   . Diabetic peripheral neuropathy ([place])   . Diverticulosis    with history of diverticulitis in 10/2011  . GERD (gastroesophageal reflux disease)   . Gout   . Hyperlipidemia   . Hypertension   . Major depression   . Morbid obesity ([place])   . Myocardial infarction    "one doctor said I did back in 2014"  . OSA (obstructive sleep apnea) 1998   "wear my CPAP a couple times/month" (01/02/2015)  . Pneumonia 2013; 2014  . Psychosexual dysfunction with inhibited sexual excitement   . Shortness of breath    "all the time" (07/26/2013)  . Type II diabetes mellitus ([place]) dx'd 1991   previously on insulin in 2000 for 3-4 years (but  was stopped because adamantly did not want to be on insulin)    Patient Active Problem List   Diagnosis Date Noted  . Sepsis ([place]) 10/26/2016  . Pyelonephritis 10/25/2016  . Essential hypertension   . Acute on chronic systolic heart failure, NYHA class 3 ([place]) 12/13/2015  . Asthma, chronic 11/02/2015  . Primary gout 05/31/2015  . Calculus of gallbladder w/o mention of cholecystitis or obstruction 05/30/2015  . Subclinical hypothyroidism 01/03/2015  . Chronic kidney disease (CKD), stage III (moderate) 01/03/2015  . Lower back pain 02/27/2014  . Long term current use of anticoagulant therapy 07/22/2013  . Atrial flutter ([place]) 07/15/2013  . Morbid obesity ([place]) 11/11/2012  . Arthritis   . Major depression   . Diabetic peripheral neuropathy ([place])   . Psychosexual dysfunction with inhibited sexual excitement   . OSA (obstructive sleep apnea)   . Chronic systolic heart failure ([place])   . Gout   . Diabetes mellitus type 2, controlled, with complications ([place]) 11/26/7251    Past Surgical History:  Procedure Laterality Date  . 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 ANKLE RECONSTRUCTION Right 1990's   2/2 trauma sustained after fall  . CARDIAC CATHETERIZATION  1990's; 07/2013  . CARDIOVERSION N/A 07/28/2013   Procedure: CARDIOVERSION;  Surgeon: Larey Dresser, MD;  Location: [facility];  Service: Cardiovascular;  Laterality: N/A;  .  CARDIOVERSION N/A 10/26/2013   Procedure: CARDIOVERSION;  Surgeon: [name], MD;  Location: [facility];  Service: Cardiovascular;  Laterality: N/A;  . CHOLECYSTECTOMY N/A 08/24/2015   Procedure: LAPAROSCOPIC CHOLECYSTECTOMY WITH INTRAOPERATIVE CHOLANGIOGRAM;  Surgeon: Jackolyn Confer, MD;  Location: [facility];  Service: General;  Laterality: N/A;  . CYST REMOVAL LEG Left 1960's   back of  leg  . FINGER AMPUTATION    . LEFT HEART CATHETERIZATION WITH CORONARY ANGIOGRAM N/A 07/27/2013   Procedure: LEFT HEART CATHETERIZATION WITH CORONARY ANGIOGRAM;   Surgeon: Peter M Martinique, MD;  [facility] C[facility];  Service: Cardiovascular;  Laterality: N/A;  . TEE WITHOUT CARDIOVERSION N/A 07/28/2013   Procedure: TRANSESOPHAGEAL ECHOCARDIOGRAM (TEE);  Surgeon: Larey Dresser, MD;  Location: [facility];  Service: Cardiovascular;  Laterality: N/A;       Home Medications    Prior to Admission medications   Medication Sig Start Date End Date Taking? Authorizing Provider  allopurinol (ZYLOPRIM) 300 MG tablet Take 300 mg by mouth daily. 10/20/16  Yes Historical Provider, MD  budesonide (PULMICORT) 0.5 MG/2ML nebulizer solution Take 4 mLs (1 mg total) by nebulization 2 (two) times daily. Patient taking differently: Take 0.5 mg by nebulization 2 (two) times daily as needed (shortness of breath/ wheezing).  12/25/15  Yes Monica Eulas Post, DO  buPROPion (WELLBUTRIN SR) 150 MG 12 hr tablet Take 1 tablet (150 mg total) by mouth 2 (two) times daily. 04/04/16  Yes [name], DO  carvedilol (COREG) 12.5 MG tablet Take 1 tablet (12.5 mg total) by mouth 2 (two) times daily with a meal. 03/27/16  Yes Thayer Headings, MD  colchicine 0.6 MG tablet Take one tablet by mouth once daily for gout prevention Patient taking differently: Take 0.6 mg by mouth See admin instructions. Take 1 tablet (0.6 mg) by mouth once every 3 days for gout preventation, may take daily if needed for gout attack 07/03/15  Yes [name], DO  diclofenac sodium (VOLTAREN) 1 % GEL Apply 2 g topically 4 (four) times daily. Patient taking differently: Apply 2 g topically 4 (four) times daily as needed (pain).  05/30/16  Yes Monica Carter, DO  fluticasone (FLONASE) 50 MCG/ACT nasal spray Use one spray in each nostril daily for seasonal allergy Patient taking differently: Place 1 spray into both nostrils daily as needed (seasonal allergies).  04/22/16  Yes [name], DO  furosemide (LASIX) 80 MG tablet Take 80 mg by mouth daily.   Yes Historical Provider, MD  gabapentin (NEURONTIN) 600 MG tablet TAKE  ONE TABLET BY MOUTH EVERY MORNING AND TAKE TWO TABLETS BY MOUTH EVERY EVENING 09/03/16  Yes [name], DO  insulin glargine (LANTUS) 100 unit/mL SOPN Inject 14 Units into the skin daily.   Yes Historical Provider, MD  ipratropium (ATROVENT) 0.02 % nebulizer solution Take 2.5 mLs (0.5 mg total) by nebulization 2 (two) times daily. Patient taking differently: Take 0.5 mg by nebulization 2 (two) times daily as needed for wheezing or shortness of breath.  12/25/15  Yes Monica Eulas Post, DO  levalbuterol [facility] HFA) 45 MCG/ACT inhaler Inhale 1-2 puffs into the lungs every 4 (four) hours as needed for wheezing. Reported on 12/13/2015 09/30/16  Yes Lauree Chandler, NP  levalbuterol (XOPENEX) 0.63 MG/3ML nebulizer solution Take 3 mLs (0.63 mg total) by nebulization every 4 (four) hours as needed for wheezing. Patient taking differently: Take 0.63 mg by nebulization 2 (two) times daily as needed for wheezing or shortness of breath.  12/25/15  Yes [name], DO  losartan (COZAAR) 25 MG tablet Take 0.5 tablets (12.5 mg total) by mouth daily. Reported on 12/13/2015 06/16/16  Yes Thayer Headings, MD  metolazone (ZAROXOLYN) 2.5 MG tablet Take 1 tablet (2.5 mg total) by mouth 2 (two) times a week. Patient taking differently: Take 2.5 mg by mouth See admin instructions. Take 1 tablet (2.5 mg) by mouth up to twice a week as needed for weight gain of 7 lbs in 24 hours 10/02/16  Yes Lauree Chandler, NP  potassium chloride SA (K-DUR,KLOR-CON) 20 MEQ tablet Take 1 tablet (20 mEq total) by mouth 2 (two) times daily. 10/20/16  Yes Lauree Chandler, NP  PRESCRIPTION MEDICATION Inhale into the lungs See admin instructions. Use CPAP whenever sleeping   Yes Historical Provider, MD  sildenafil (VIAGRA) 100 MG tablet Take 1 tablet (100 mg total) by mouth daily as needed for erectile dysfunction. Reported on 12/13/2015 06/10/16  Yes Wonda Cheng Nahser, MD  XARELTO 20 MG TABS tablet TAKE 1 TABLET (20 MG TOTAL) BY MOUTH DAILY WITH  SUPPER. Patient taking differently: TAKE 1 TABLET (20 MG TOTAL) BY MOUTH DAILY IN THE MORNING 01/04/16  Yes [name], MD  allopurinol (ZYLOPRIM) 100 MG tablet Take 1 tablet (100 mg total) by mouth daily. Patient not taking: Reported on 10/25/2016 12/21/15   Ripudeep Krystal Eaton, MD  Blood Glucose Monitoring Suppl (ACCU-CHEK AVIVA PLUS) w/Device KIT Use glucose meter to check blood sugars 4 times daily. 10/23/16   Lauree Chandler, NP  glucose blood (ACCU-CHEK AVIVA PLUS) test strip Use to check blood sugar four times daily. Dx: E11.8 10/23/16   Lauree Chandler, NP  Insulin Pen Needle 31G X 8 MM MISC Use daily with the administration of lantus E11.8 04/04/16   [name], DO  oxyCODONE (OXY IR/ROXICODONE) 5 MG immediate release tablet Take 1-2 tablets (5-10 mg total) by mouth every 4 (four) hours as needed for moderate pain, severe pain or breakthrough pain. Patient not taking: Reported on 10/25/2016 10/08/16   Estill Dooms, MD    Family History Family History  Problem Relation Age of Onset  . Diabetes Mother   . CAD Mother 4    requiring quadruple bypass  . Hypertension Mother   . Alzheimer's disease Mother   . Stomach cancer Father   . Lung cancer Father     was a smoker  . Gout Father   . CAD Brother 85    requiring quadruple bypass  . Seizures Brother   . Hypertension Brother   . Diabetes Maternal Grandmother   . Alzheimer's disease Maternal Grandmother   . Breast cancer Paternal Aunt   . Diabetes Maternal Aunt   . Seizures Paternal Uncle     Social History Social History  Substance Use Topics  . Smoking status: Former Smoker    Packs/day: 0.12    Years: 10.00    Types: Cigarettes    Quit date: 09/13/2015  . Smokeless tobacco: Never Used     Comment: rarely.  . Alcohol use 0.0 oz/week     Comment: OCCASIONAL     Allergies   Patient has no known allergies.   Review of Systems Review of Systems  Constitutional: Positive for fever. Negative for chills.    HENT: Negative for ear pain and sore throat.   Eyes: Negative for pain and visual disturbance.  Respiratory: Positive for cough and shortness of breath.   Cardiovascular: Positive for orthopnea. Negative for chest pain and palpitations.  Gastrointestinal: Positive for abdominal pain  and nausea. Negative for vomiting.  Genitourinary: Positive for dysuria and flank pain. Negative for hematuria.  Musculoskeletal: Negative for arthralgias and back pain.  Skin: Negative for color change and rash.  Neurological: Negative for seizures and syncope.  All other systems reviewed and are negative.    Physical Exam Updated Vital Signs BP (!) 85/56   Pulse 75   Temp 99.1 F (37.3 C) (Oral)   Resp 10   Ht '5\' 7"'  (1.702 m)   Wt 122.9 kg   SpO2 100%   BMI 42.44 kg/m   Physical Exam  Constitutional: He appears well-developed and well-nourished.  HENT:  Head: Normocephalic and atraumatic.  Eyes: Conjunctivae are normal.  Neck: Neck supple.  Cardiovascular: Normal rate and regular rhythm.   No murmur heard. Pulmonary/Chest: Effort normal and breath sounds normal. No respiratory distress.  Abdominal: Soft. There is generalized tenderness and tenderness in the suprapubic area. There is CVA tenderness (R). There is no rigidity, no rebound, no guarding and negative Murphy's sign.  Genitourinary: Testes normal and penis normal. Uncircumcised.  Musculoskeletal: He exhibits no edema.  Neurological: He is alert.  Skin: Skin is warm and dry.  Psychiatric: He has a normal mood and affect.  Nursing note and vitals reviewed.    ED Treatments / Results  Labs (all labs ordered are listed, but only abnormal results are displayed) Labs Reviewed  COMPREHENSIVE METABOLIC PANEL - Abnormal; Notable for the following:       Result Value   Sodium 133 (*)    Chloride 96 (*)    Glucose, Bld 195 (*)    BUN 21 (*)    Creatinine, Ser 1.27 (*)    Albumin 3.0 (*)    Total Bilirubin 2.1 (*)    All other  components within normal limits  CBC WITH DIFFERENTIAL/PLATELET - Abnormal; Notable for the following:    WBC 17.3 (*)    RDW 15.6 (*)    Neutro Abs 15.3 (*)    All other components within normal limits  PROTIME-INR - Abnormal; Notable for the following:    Prothrombin Time 16.2 (*)    All other components within normal limits  URINALYSIS, ROUTINE W REFLEX MICROSCOPIC (NOT AT [facility]) - Abnormal; Notable for the following:    Color, Urine ORANGE (*)    APPearance CLOUDY (*)    Hgb urine dipstick SMALL (*)    Bilirubin Urine SMALL (*)    Ketones, ur 15 (*)    Protein, ur 100 (*)    Nitrite POSITIVE (*)    Leukocytes, UA MODERATE (*)    All other components within normal limits  BRAIN NATRIURETIC PEPTIDE - Abnormal; Notable for the following:    B Natriuretic Peptide 282.8 (*)    All other components within normal limits  BETA-HYDROXYBUTYRIC ACID - Abnormal; Notable for the following:    Beta-Hydroxybutyric Acid 0.34 (*)    All other components within normal limits  URINE MICROSCOPIC-ADD ON - Abnormal; Notable for the following:    Squamous Epithelial / LPF 6-30 (*)    Bacteria, UA FEW (*)    Casts HYALINE CASTS (*)    All other components within normal limits  I-STAT CG4 LACTIC ACID, ED - Abnormal; Notable for the following:    Lactic Acid, Venous 2.34 (*)    All other components within normal limits  CBG MONITORING, ED - Abnormal; Notable for the following:    Glucose-Capillary 199 (*)    All other components within normal limits  I-STAT  VENOUS BLOOD GAS, ED - Abnormal; Notable for the following:    pH, Ven 7.442 (*)    pCO2, Ven 32.8 (*)    All other components within normal limits  CULTURE, BLOOD (ROUTINE X 2)  CULTURE, BLOOD (ROUTINE X 2)  URINE CULTURE  BLOOD GAS, VENOUS  I-STAT TROPOININ, ED  I-STAT CG4 LACTIC ACID, ED    EKG  EKG Interpretation  Date/Time:  Saturday October 25 2016 15:54:33 EST Ventricular Rate:  147 PR Interval:    QRS Duration: 56 QT  Interval:  312 QTC Calculation: 488 R Axis:   46 Text Interpretation:  Atrial flutter with variable A-V block with premature ventricular or aberrantly conducted complexes Low voltage QRS Possible Anterolateral infarct , age undetermined Abnormal ECG Confirmed by [facility] MD, Corene Cornea [phone]) on 10/25/2016 4:22:03 PM       Radiology Dg Chest 2 View  Result Date: 10/25/2016 CLINICAL DATA:  Shortness of breath. Cough. Chills. Nausea vomiting. EXAM: CHEST  2 VIEW COMPARISON:  Two-view chest x-ray a 08/20/2016 FINDINGS: The heart is enlarged. Mild edema is present. No focal airspace disease is evident. There are no effusions. The visualized soft tissues and bony thorax are unremarkable. IMPRESSION: 1. Cardiomegaly and mild edema compatible with congestive heart failure. 2. No focal airspace consolidation. Electronically Signed   By: San Morelle M.D.   On: 10/25/2016 16:38    Procedures Procedures (including critical care time)  Medications Ordered in ED Medications  ibuprofen (ADVIL,MOTRIN) 200 MG tablet (not administered)  ondansetron (ZOFRAN) injection 4 mg (4 mg Intravenous Given 10/25/16 1642)  sodium chloride 0.9 % bolus 500 mL (0 mLs Intravenous Stopped 10/25/16 1830)  acetaminophen (TYLENOL) tablet 1,000 mg (1,000 mg Oral Given 10/25/16 1642)  cefTRIAXone (ROCEPHIN) 1 g in dextrose 5 % 50 mL IVPB (0 g Intravenous Stopped 10/25/16 1756)  sodium chloride 0.9 % bolus 500 mL (0 mLs Intravenous Stopped 10/25/16 1946)  ibuprofen (ADVIL,MOTRIN) tablet 600 mg (600 mg Oral Given 10/25/16 1840)  sodium chloride 0.9 % bolus 500 mL (0 mLs Intravenous Stopped 10/25/16 2015)  sodium chloride 0.9 % bolus 1,000 mL (0 mLs Intravenous Stopped 10/25/16 2118)  sodium chloride 0.9 % bolus 1,000 mL (0 mLs Intravenous Stopped 10/25/16 2229)  norepinephrine (LEVOPHED) 4 mg in dextrose 5 % 250 mL (0.016 mg/mL) infusion (5 mcg/min Intravenous Rate/Dose Change 10/25/16 2343)     Initial Impression / Assessment and  Plan / ED Course  I have reviewed the triage vital signs and the nursing notes.  Pertinent labs & imaging results that were available during my care of the patient were reviewed by me and considered in my medical decision making (see chart for details).  Clinical Course    55 year old male with history of COPD CHF CAD comes to [place] with shortness of breath dysuria and flank pain. On exam he has right CVA tenderness noted on the left. He has suprapubic tenderness. He's been having dysuria for the past and a half, no history of urinary tract infection. He does have a normal testicular exam normal penile exam with uncircumcised penis. He is tachycardic and slightly short of breath and tachypnea. He also has a history of diabetes we will screen for DKA as well. Initial lactic acid 2.3. He is given 500 mL of fluid. 104.6. He's given a gram of Tylenol. EKG shows a tachycardic rhythm is irregular with intermittent P waves, he does have a history of A. Fib/flutter. chest x-ray shows cardiomegaly with mild pulmonary edema similar to previous.  Patient's fever does come down after Tylenol and Motrin. Tachycardia is slightly resolved with fluids. He is slowly fluid resuscitated to 3500 mL normal saline. Blood pressures continue to be low levofloxacin started. The source appears to be his urinary tract, likely pyelonephritis. Rocephin was given. Second lactic acid is improved from prior now 1.7. Patient is not in DKA. Patient will be admitted to the medical ICU for further treatment. For the remainder of his care further information please see inpatient team notes. Vital signs stable time of handoff.  Final Clinical Impressions(s) / ED Diagnoses   Final diagnoses:  Pyelonephritis  Sepsis, due to unspecified organism [facility])    New Prescriptions New Prescriptions   No medications on file     Dewaine Conger, MD 10/26/16 7142    Merrily Pew, MD 10/26/16 [phone]

## 2016-10-25 NOTE — H&P (Signed)
PULMONARY / CRITICAL CARE MEDICINE   Name: Jeremiah Perkins MRN: [id number] DOB: [birth year]    ADMISSION DATE:  10/25/2016 CONSULTATION DATE:  10/25/16   REFERRING MD:  ED physician  CHIEF COMPLAINT:  Septic shock from urinary source   HISTORY OF PRESENT ILLNESS:   Presented to [facility] with 2-3 days of shortness of breath and 1 day of fever, dysuria and flank pain.  Was found to have pyelonephritis.  While in the ED he developed hypotension despite adequate fluid resuscitiation.  He was started on levophed and admitted to the ICU.   PAST MEDICAL HISTORY :  He  has a past medical history of Anxiety; Arthritis; Asthma; Atrial flutter ([place]); Cholelithiasis; Chronic back pain; Chronic bronchitis ([place]); Congestive heart failure ([place]); COPD (chronic obstructive pulmonary disease) ([place]); Diabetic peripheral neuropathy ([place]); Diverticulosis; GERD (gastroesophageal reflux disease); Gout; Hyperlipidemia; Hypertension; Major depression; Morbid obesity ([place]); Myocardial infarction; OSA (obstructive sleep apnea) (1998); Pneumonia (2013; 2014); Psychosexual dysfunction with inhibited sexual excitement; Shortness of breath; and Type II diabetes mellitus ([place]) (dx'd 1991).  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  No Known Allergies  No current facility-administered medications on file prior to encounter.    Current Outpatient Prescriptions on File Prior to Encounter  Medication Sig  . budesonide (PULMICORT) 0.5 MG/2ML nebulizer solution Take 4 mLs (1 mg total) by nebulization 2 (two) times daily. (Patient taking  differently: Take 0.5 mg by nebulization 2 (two) times daily as needed (shortness of breath/ wheezing). )  . buPROPion (WELLBUTRIN SR) 150 MG 12 hr tablet Take 1 tablet (150 mg total) by mouth 2 (two) times daily.  . carvedilol (COREG) 12.5 MG tablet Take 1 tablet (12.5 mg total) by mouth 2 (two) times daily with a meal.  . colchicine 0.6 MG tablet Take one tablet by mouth once daily for gout prevention (Patient taking differently: Take 0.6 mg by mouth See admin instructions. Take 1 tablet (0.6 mg) by mouth once every 3 days for gout preventation, may take daily if needed for gout attack)  . diclofenac sodium (VOLTAREN) 1 % GEL Apply 2 g topically 4 (four) times daily. (Patient taking differently: Apply 2 g topically 4 (four) times daily as needed (pain). )  . fluticasone (FLONASE) 50 MCG/ACT nasal spray Use one spray in each nostril daily for seasonal allergy (Patient taking differently: Place 1 spray into both nostrils daily as needed (seasonal allergies). )  . furosemide (LASIX) 80 MG tablet Take 80 mg by mouth daily.  Marland Kitchen gabapentin (NEURONTIN) 600 MG tablet TAKE ONE TABLET BY MOUTH EVERY MORNING AND TAKE TWO TABLETS BY MOUTH EVERY EVENING  . ipratropium (ATROVENT) 0.02 % nebulizer solution Take 2.5 mLs (0.5 mg total) by nebulization 2 (two) times daily. (Patient taking differently: Take 0.5 mg by nebulization 2 (two) times daily as needed for wheezing or shortness of breath. )  . levalbuterol (XOPENEX HFA) 45 MCG/ACT inhaler Inhale 1-2 puffs into the lungs every 4 (four) hours as needed for wheezing. Reported on 12/13/2015  . levalbuterol (XOPENEX) 0.63 MG/3ML nebulizer solution Take 3 mLs (0.63 mg total) by nebulization every 4 (four) hours as needed for wheezing. (Patient taking differently: Take 0.63 mg by nebulization 2 (two) times daily as needed for wheezing  or shortness of breath. )  . losartan (COZAAR) 25 MG tablet Take 0.5 tablets (12.5 mg total) by mouth daily. Reported on 12/13/2015  .  metolazone (ZAROXOLYN) 2.5 MG tablet Take 1 tablet (2.5 mg total) by mouth 2 (two) times a week. (Patient taking differently: Take 2.5 mg by mouth See admin instructions. Take 1 tablet (2.5 mg) by mouth up to twice a week as needed for weight gain of 7 lbs in 24 hours)  . potassium chloride SA (K-DUR,KLOR-CON) 20 MEQ tablet Take 1 tablet (20 mEq total) by mouth 2 (two) times daily.  . sildenafil (VIAGRA) 100 MG tablet Take 1 tablet (100 mg total) by mouth daily as needed for erectile dysfunction. Reported on 12/13/2015  . XARELTO 20 MG TABS tablet TAKE 1 TABLET (20 MG TOTAL) BY MOUTH DAILY WITH SUPPER. (Patient taking differently: TAKE 1 TABLET (20 MG TOTAL) BY MOUTH DAILY IN THE MORNING)  . allopurinol (ZYLOPRIM) 100 MG tablet Take 1 tablet (100 mg total) by mouth daily. (Patient not taking: Reported on 10/25/2016)  . Blood Glucose Monitoring Suppl (ACCU-CHEK AVIVA PLUS) w/Device KIT Use glucose meter to check blood sugars 4 times daily.  Marland Kitchen glucose blood (ACCU-CHEK AVIVA PLUS) test strip Use to check blood sugar four times daily. Dx: E11.8  . Insulin Pen Needle 31G X 8 MM MISC Use daily with the administration of lantus E11.8  . oxyCODONE (OXY IR/ROXICODONE) 5 MG immediate release tablet Take 1-2 tablets (5-10 mg total) by mouth every 4 (four) hours as needed for moderate pain, severe pain or breakthrough pain. (Patient not taking: Reported on 10/25/2016)    FAMILY HISTORY:  His indicated that his mother is deceased. He indicated that his father is deceased. He indicated that only one of his eight brothers is alive. He indicated that the status of his maternal grandmother is unknown. He indicated that both of his daughters are alive. He indicated that his son is alive. He indicated that the status of his maternal aunt is unknown. He indicated that the status of his paternal aunt is unknown. He indicated that the status of his paternal uncle is unknown.    SOCIAL HISTORY: He  reports that he quit  smoking about 13 months ago. His smoking use included Cigarettes. He has a 1.20 pack-year smoking history. He has never used smokeless tobacco. He reports that he drinks alcohol. He reports that he does not use drugs.  REVIEW OF SYSTEMS:   A review of 14 systems was negative except as stated in the HPI.   SUBJECTIVE:  55 y/o with DM and CHF found to have septic shock 2/2 pyelonepritis.  VITAL SIGNS: BP (!) 89/78   Pulse 95   Temp 99.1 F (37.3 C) (Oral)   Resp 17   Ht '5\' 7"'  (1.702 m)   Wt 122.9 kg (271 lb)   SpO2 99%   BMI 42.44 kg/m   HEMODYNAMICS:    VENTILATOR SETTINGS:    INTAKE / OUTPUT: I/O last 3 completed shifts: In: 550 [I.V.:550] Out: -   PHYSICAL EXAMINATION: General:  Laying in bed, uncomfortable.  Neuro:  A&Ox3 HEENT:  PERRL, EOMI, OP clear Cardiovascular:  Normal rate, irregular rhythm, no MRG Lungs:  CTAB, no WRR Abdomen:  Obese, + BS, flank TTP Musculoskeletal:  LE edema, no joint abnormalities Skin:  No rashes or other lesions.  LABS:  BMET  Recent Labs Lab 10/23/16 1132 10/25/16 1637  NA 136 133*  K 3.9 4.9  CL 97* 96*  CO2  30 24  BUN 35* 21*  CREATININE 1.16 1.27*  GLUCOSE 219* 195*    Electrolytes  Recent Labs Lab 10/23/16 1132 10/25/16 1637  CALCIUM 8.7 9.2    CBC  Recent Labs Lab 10/25/16 1637  WBC 17.3*  HGB 14.6  HCT 43.8  PLT 264    Coag's  Recent Labs Lab 10/25/16 1637  INR 1.29    Sepsis Markers  Recent Labs Lab 10/25/16 1624 10/25/16 1912  LATICACIDVEN 2.34* 1.77    ABG No results for input(s): PHART, PCO2ART, PO2ART in the last 168 hours.  Liver Enzymes  Recent Labs Lab 10/25/16 1637  AST 27  ALT 20  ALKPHOS 80  BILITOT 2.1*  ALBUMIN 3.0*    Cardiac Enzymes No results for input(s): TROPONINI, PROBNP in the last 168 hours.  Glucose  Recent Labs Lab 10/25/16 1648  GLUCAP 199*    Imaging Dg Chest 2 View  Result Date: 10/25/2016 CLINICAL DATA:  Shortness of breath.  Cough. Chills. Nausea vomiting. EXAM: CHEST  2 VIEW COMPARISON:  Two-view chest x-ray a 08/20/2016 FINDINGS: The heart is enlarged. Mild edema is present. No focal airspace disease is evident. There are no effusions. The visualized soft tissues and bony thorax are unremarkable. IMPRESSION: 1. Cardiomegaly and mild edema compatible with congestive heart failure. 2. No focal airspace consolidation. Electronically Signed   By: San Morelle M.D.   On: 10/25/2016 16:38     STUDIES:  CXR - cardiomegaly  CULTURES: Blood,  Urine 10/25/16  ANTIBIOTICS: ceftriaxone  SIGNIFICANT EVENTS:   LINES/TUBES:   DISCUSSION: 55 y/o man with a-fib, DM, CHF admitted with fluid refractory shock 2/2 pyelonephritis  ASSESSMENT / PLAN:  PULMONARY A: No acute issues P:     CARDIOVASCULAR A:  A-fib, hypotension P:  On anticoagulation at home, holding pending need for procedures Levophed to maintain MAP>65.  RENAL A:   Pyelonephritis P:   Renal ultrasound to rule out obstruction Place foley catheter as pt unable to void.  INFECTIOUS A:   Pyelonephritis P:   Ceftriaxone pending culture results  ENDOCRINE A:   DM   P:   SSI Check AM cortisol given fluid refractory shock.      FAMILY  - Updates: Wife updated at bedside in ER  - Inter-disciplinary family meet or Palliative Care meeting due by:  11/01/16    Pulmonary and [place] Pager: [phone]  10/25/2016, 11:32 PM

## 2016-10-25 NOTE — ED Notes (Signed)
EDP at bedside reevaluating respiratory status post fluid. Pt. New onset of dry cough

## 2016-10-25 NOTE — ED Notes (Signed)
[facility] notifed RN that they recommend patient have 4000 mL fluid resuscitation. EDP made aware.

## 2016-10-25 NOTE — Progress Notes (Signed)
Went to evaluate Jeremiah Perkins initially around 1730 on 10/25/16. BP noted to be 70s/50s at that time. Recommended continuing fluids for suspected Septic Shock. Recommend discussion with CCM due to low blood pressures not improved with fluid resuscitation. Very fine balance of fluid management septic shock patient with history of significant HFrEF, may require a higher level of care and if continues to have low blood pressures may require pressors. qSOFA of 2 with 6% chance of poor outcome.  Will not admit to [facility] Medicine Teaching Service until status is consistently stabilized.   Dr. Caroleen Hamman 10/25/16, 11:25 PM

## 2016-10-25 NOTE — ED Notes (Signed)
Family medicine paged at this time

## 2016-10-26 ENCOUNTER — Inpatient Hospital Stay (HOSPITAL_COMMUNITY): Payer: PPO

## 2016-10-26 ENCOUNTER — Encounter (HOSPITAL_COMMUNITY): Payer: Self-pay | Admitting: Rehabilitation

## 2016-10-26 DIAGNOSIS — A4151 Sepsis due to Escherichia coli [E. coli]: Secondary | ICD-10-CM | POA: Diagnosis not present

## 2016-10-26 DIAGNOSIS — N183 Chronic kidney disease, stage 3 (moderate): Secondary | ICD-10-CM | POA: Diagnosis not present

## 2016-10-26 DIAGNOSIS — K5732 Diverticulitis of large intestine without perforation or abscess without bleeding: Secondary | ICD-10-CM | POA: Diagnosis not present

## 2016-10-26 DIAGNOSIS — I5023 Acute on chronic systolic (congestive) heart failure: Secondary | ICD-10-CM | POA: Diagnosis not present

## 2016-10-26 DIAGNOSIS — I4892 Unspecified atrial flutter: Secondary | ICD-10-CM | POA: Diagnosis not present

## 2016-10-26 DIAGNOSIS — N179 Acute kidney failure, unspecified: Secondary | ICD-10-CM | POA: Diagnosis not present

## 2016-10-26 DIAGNOSIS — N10 Acute tubulo-interstitial nephritis: Secondary | ICD-10-CM | POA: Diagnosis not present

## 2016-10-26 DIAGNOSIS — E1142 Type 2 diabetes mellitus with diabetic polyneuropathy: Secondary | ICD-10-CM | POA: Diagnosis not present

## 2016-10-26 DIAGNOSIS — J9601 Acute respiratory failure with hypoxia: Secondary | ICD-10-CM | POA: Diagnosis not present

## 2016-10-26 DIAGNOSIS — I959 Hypotension, unspecified: Secondary | ICD-10-CM

## 2016-10-26 DIAGNOSIS — I4891 Unspecified atrial fibrillation: Secondary | ICD-10-CM | POA: Diagnosis not present

## 2016-10-26 DIAGNOSIS — E1122 Type 2 diabetes mellitus with diabetic chronic kidney disease: Secondary | ICD-10-CM | POA: Diagnosis not present

## 2016-10-26 DIAGNOSIS — N12 Tubulo-interstitial nephritis, not specified as acute or chronic: Secondary | ICD-10-CM

## 2016-10-26 DIAGNOSIS — A419 Sepsis, unspecified organism: Secondary | ICD-10-CM | POA: Diagnosis not present

## 2016-10-26 DIAGNOSIS — Z6841 Body Mass Index (BMI) 40.0 and over, adult: Secondary | ICD-10-CM | POA: Diagnosis not present

## 2016-10-26 DIAGNOSIS — R6521 Severe sepsis with septic shock: Secondary | ICD-10-CM | POA: Diagnosis not present

## 2016-10-26 LAB — GLUCOSE, CAPILLARY
GLUCOSE-CAPILLARY: 206 mg/dL — AB (ref 65–99)
GLUCOSE-CAPILLARY: 262 mg/dL — AB (ref 65–99)
GLUCOSE-CAPILLARY: 157 mg/dL — AB (ref 65–99)
Glucose-Capillary: 275 mg/dL — ABNORMAL HIGH (ref 65–99)
Glucose-Capillary: 220 mg/dL — ABNORMAL HIGH (ref 65–99)
Glucose-Capillary: 210 mg/dL — ABNORMAL HIGH (ref 65–99)

## 2016-10-26 LAB — CBC
HEMATOCRIT: 39.4 % (ref 39.0–52.0)
HEMOGLOBIN: 12.7 g/dL — AB (ref 13.0–17.0)
MCH: 29.5 pg (ref 26.0–34.0)
MCHC: 32.2 g/dL (ref 30.0–36.0)
MCV: 91.4 fL (ref 78.0–100.0)
Platelets: 237 10*3/uL (ref 150–400)
RBC: 4.31 MIL/uL (ref 4.22–5.81)
RDW: 15.7 % — ABNORMAL HIGH (ref 11.5–15.5)
WBC: 16.8 10*3/uL — AB (ref 4.0–10.5)

## 2016-10-26 LAB — MRSA PCR SCREENING: MRSA BY PCR: NEGATIVE

## 2016-10-26 LAB — CREATININE, SERUM
Creatinine, Ser: 1.92 mg/dL — ABNORMAL HIGH (ref 0.61–1.24)
GFR, EST AFRICAN AMERICAN: 43 mL/min — AB (ref >60)
GFR, EST NON AFRICAN AMERICAN: 37 mL/min — AB (ref >60)

## 2016-10-26 LAB — CORTISOL: CORTISOL PLASMA: 21.4 ug/dL

## 2016-10-26 MED ORDER — CEFTRIAXONE SODIUM 1 G IJ SOLR
1.0000 g | INTRAMUSCULAR | Status: DC
  Administered 2016-10-26 – 2016-10-27 (×2): 1 g via INTRAVENOUS
  Filled 2016-10-26 (×4): qty 10

## 2016-10-26 MED ORDER — SODIUM CHLORIDE 0.9 % IV SOLN
INTRAVENOUS | Status: AC
  Administered 2016-10-26 (×2): via INTRAVENOUS

## 2016-10-26 MED ORDER — INSULIN ASPART 100 UNIT/ML ~~LOC~~ SOLN
0.0000 [IU] | SUBCUTANEOUS | Status: DC
  Administered 2016-10-26: 5 [IU] via SUBCUTANEOUS
  Administered 2016-10-26: 8 [IU] via SUBCUTANEOUS
  Administered 2016-10-26: 2 [IU] via SUBCUTANEOUS
  Administered 2016-10-26 (×2): 5 [IU] via SUBCUTANEOUS
  Administered 2016-10-26: 8 [IU] via SUBCUTANEOUS
  Administered 2016-10-27: 3 [IU] via SUBCUTANEOUS
  Administered 2016-10-27: 2 [IU] via SUBCUTANEOUS
  Administered 2016-10-27: 3 [IU] via SUBCUTANEOUS

## 2016-10-26 MED ORDER — ENOXAPARIN SODIUM 60 MG/0.6ML ~~LOC~~ SOLN
60.0000 mg | SUBCUTANEOUS | Status: DC
  Administered 2016-10-26: 60 mg via SUBCUTANEOUS
  Filled 2016-10-26: qty 0.6

## 2016-10-26 MED ORDER — LEVALBUTEROL HCL 0.63 MG/3ML IN NEBU
0.6300 mg | INHALATION_SOLUTION | Freq: Four times a day (QID) | RESPIRATORY_TRACT | Status: DC | PRN
  Administered 2016-10-27: 0.63 mg via RESPIRATORY_TRACT
  Filled 2016-10-26: qty 3

## 2016-10-26 MED ORDER — MORPHINE SULFATE (PF) 2 MG/ML IV SOLN
2.0000 mg | INTRAVENOUS | Status: DC | PRN
  Administered 2016-10-26 – 2016-10-28 (×7): 2 mg via INTRAVENOUS
  Filled 2016-10-26 (×7): qty 1

## 2016-10-26 NOTE — Progress Notes (Signed)
Pt has low BP, while he is sitting up, Unable to lower HOB. States: "I never lay down" BP is depending on position.  After recheck  BP in 90. MD aware. Still ok to transport.

## 2016-10-26 NOTE — Progress Notes (Addendum)
PULMONARY / CRITICAL CARE MEDICINE   Name: Jeremiah Perkins MRN: [id number] DOB: 1961/07/15    ADMISSION DATE:  10/25/2016 CONSULTATION DATE:  10/25/16   REFERRING MD:  ED physician  CHIEF COMPLAINT:  Septic shock from urinary source   HISTORY OF PRESENT ILLNESS:   Presented to [facility] with 2-3 days of shortness of breath and 1 day of fever, dysuria and flank pain.  Was found to have pyelonephritis.  While in the ED he developed hypotension despite adequate fluid resuscitiation.  He was started on levophed and admitted to the ICU.   SUBJECTIVE:  No events overnight, off pressors  VITAL SIGNS: BP 90/63 (BP Location: Left Arm)   Pulse (!) 106   Temp 99.8 F (37.7 C) (Oral)   Resp (!) 32   Ht 5\' 7"  (1.702 m)   Wt 126.6 kg (279 lb 1.6 oz)   SpO2 97%   BMI 43.71 kg/m   HEMODYNAMICS:    VENTILATOR SETTINGS:    INTAKE / OUTPUT: I/O last 3 completed shifts: In: 2920.4 [P.O.:240; I.V.:2680.4] Out: 130 [Urine:130]  PHYSICAL EXAMINATION: General:  Laying in bed, uncomfortable.  Neuro:  A&Ox3, moving all ext to command HEENT:  PERRL, EOMI, OP clear Cardiovascular:  Normal rate, irregular rhythm, no MRG Lungs:  CTAB, no WRR Abdomen:  Obese, + BS, flank TTP Musculoskeletal:  LE edema, no joint abnormalities Skin:  No rashes or other lesions.  LABS:  BMET  Recent Labs Lab 10/23/16 1132 10/25/16 1637 10/26/16 0643  NA 136 133*  --   K 3.9 4.9  --   CL 97* 96*  --   CO2 30 24  --   BUN 35* 21*  --   CREATININE 1.16 1.27* 1.92*  GLUCOSE 219* 195*  --    Electrolytes  Recent Labs Lab 10/23/16 1132 10/25/16 1637  CALCIUM 8.7 9.2   CBC  Recent Labs Lab 10/25/16 1637 10/26/16 0643  WBC 17.3* 16.8*  HGB 14.6 12.7*  HCT 43.8 39.4  PLT 264 237   Coag's  Recent Labs Lab 10/25/16 1637  INR 1.29   Sepsis Markers  Recent Labs Lab 10/25/16 1624 10/25/16 1912  LATICACIDVEN 2.34* 1.77   ABG No results for input(s): PHART, PCO2ART, PO2ART in the last  168 hours.  Liver Enzymes  Recent Labs Lab 10/25/16 1637  AST 27  ALT 20  ALKPHOS 80  BILITOT 2.1*  ALBUMIN 3.0*   Cardiac Enzymes No results for input(s): TROPONINI, PROBNP in the last 168 hours.  Glucose  Recent Labs Lab 10/25/16 1648 10/26/16 0143 10/26/16 0424 10/26/16 0742  GLUCAP 199* 206* 275* 262*   Imaging Dg Chest 2 View  Result Date: 10/25/2016 CLINICAL DATA:  Shortness of breath. Cough. Chills. Nausea vomiting. EXAM: CHEST  2 VIEW COMPARISON:  Two-view chest x-ray a 08/20/2016 FINDINGS: The heart is enlarged. Mild edema is present. No focal airspace disease is evident. There are no effusions. The visualized soft tissues and bony thorax are unremarkable. IMPRESSION: 1. Cardiomegaly and mild edema compatible with congestive heart failure. 2. No focal airspace consolidation. Electronically Signed   By: Marin Roberts M.D.   On: 10/25/2016 16:38   STUDIES:  CXR - cardiomegaly  CULTURES: Blood,  Urine 10/25/16  ANTIBIOTICS: Ceftriaxone 12/2>>>  SIGNIFICANT EVENTS: 12/2 hospital admission for hypotension and pyelo  LINES/TUBES: PIV  I reviewed CXR myself, no acute disease  DISCUSSION: 55 y/o man with a-fib, DM, CHF admitted with fluid refractory shock 2/2 pyelonephritis  ASSESSMENT / PLAN:  PULMONARY  A: No acute issues P:   Titrate O2 for sat of 88-92%  CARDIOVASCULAR A:  A-fib, hypotension P:  On anticoagulation at home for atrial flutter, holding pending need for procedures D/C levophed  RENAL A:   Pyelonephritis P:   Renal ultrasound to rule out obstruction pending Foley catheter as pt unable to void. CT of the abdomen/pelvis without contrast ordered  INFECTIOUS A:   Pyelonephritis P:   Ceftriaxone  F/U on culture U/S pending, will d/c CT of the abdomen/pelvis  ENDOCRINE A:   DM   P:   SSI Cortisol 21.4 and no longer hypotensive d/c hydrocortisone  FAMILY  - Updates: Patient updated bedside  -  Inter-disciplinary family meet or Palliative Care meeting due by:  11/01/16  Discussed with [facility]-MD, [facility] with [facility]M signing off 12/3.  Alyson ReedyWesam G. Mariely Mahr, M.D. [facility]monary[facility] Medicine. Pager: [phone]. After hours pager: [phone].  10/26/2016, 8:32 AM

## 2016-10-26 NOTE — Progress Notes (Signed)
Report was given to upcoming nurse Christina. Ultrasound is at the bedside

## 2016-10-26 NOTE — Progress Notes (Signed)
Patient arrived in the unit at 14:00 pm accompanied by NT and patient's wife. Orientation to the unit given. Patient verbalizes understanding.

## 2016-10-26 NOTE — Progress Notes (Signed)
Patient transferred  from 31M arrived in the unit   with foley catheter due to urinary retention as per MD ordered.  Patient complaining of painful sensation while urinating  but patient refused to take it out. Report given to next shift nurse.

## 2016-10-26 NOTE — ED Notes (Signed)
CCM at bedside 

## 2016-10-26 NOTE — Progress Notes (Signed)
Paged MD regarding the results of CT of the abdomen . Awaiting for response.

## 2016-10-26 NOTE — Progress Notes (Signed)
[facility]Link Physician-Brief Progress Note Patient Name: Jeremiah Perkins DOB: 10/04/1961 MRN: [id number]   Date of Service  10/26/2016  HPI/Events of Note  Patient complaining of dyspnea with normal saturation. Currently in a flutter with heart rate controlled. Underlying COPD.   eICU Interventions  Xopenex nebs every 6 hours when necessary      Intervention Category Intermediate Interventions: Other:  Lawanda Cousins 10/26/2016, 6:51 AM

## 2016-10-26 NOTE — Progress Notes (Signed)
Pt transported to [facility] 07. Wife at the bedisde.

## 2016-10-27 DIAGNOSIS — N183 Chronic kidney disease, stage 3 (moderate): Secondary | ICD-10-CM | POA: Diagnosis not present

## 2016-10-27 DIAGNOSIS — E118 Type 2 diabetes mellitus with unspecified complications: Secondary | ICD-10-CM

## 2016-10-27 DIAGNOSIS — I5022 Chronic systolic (congestive) heart failure: Secondary | ICD-10-CM

## 2016-10-27 DIAGNOSIS — N12 Tubulo-interstitial nephritis, not specified as acute or chronic: Secondary | ICD-10-CM | POA: Diagnosis not present

## 2016-10-27 DIAGNOSIS — I10 Essential (primary) hypertension: Secondary | ICD-10-CM

## 2016-10-27 DIAGNOSIS — Z7901 Long term (current) use of anticoagulants: Secondary | ICD-10-CM

## 2016-10-27 LAB — BASIC METABOLIC PANEL
ANION GAP: 11 (ref 5–15)
BUN: 25 mg/dL — ABNORMAL HIGH (ref 6–20)
CHLORIDE: 102 mmol/L (ref 101–111)
CO2: 20 mmol/L — ABNORMAL LOW (ref 22–32)
Calcium: 8.6 mg/dL — ABNORMAL LOW (ref 8.9–10.3)
Creatinine, Ser: 1.32 mg/dL — ABNORMAL HIGH (ref 0.61–1.24)
GFR, EST NON AFRICAN AMERICAN: 58 mL/min — AB (ref >60)
Glucose, Bld: 116 mg/dL — ABNORMAL HIGH (ref 65–99)
POTASSIUM: 3.6 mmol/L (ref 3.5–5.1)
SODIUM: 133 mmol/L — AB (ref 135–145)

## 2016-10-27 LAB — GLUCOSE, CAPILLARY
GLUCOSE-CAPILLARY: 118 mg/dL — AB (ref 65–99)
GLUCOSE-CAPILLARY: 161 mg/dL — AB (ref 65–99)
GLUCOSE-CAPILLARY: 189 mg/dL — AB (ref 65–99)
Glucose-Capillary: 114 mg/dL — ABNORMAL HIGH (ref 65–99)
Glucose-Capillary: 183 mg/dL — ABNORMAL HIGH (ref 65–99)
Glucose-Capillary: 188 mg/dL — ABNORMAL HIGH (ref 65–99)

## 2016-10-27 LAB — CBC
HCT: 38.1 % — ABNORMAL LOW (ref 39.0–52.0)
HEMOGLOBIN: 12.8 g/dL — AB (ref 13.0–17.0)
MCH: 30.4 pg (ref 26.0–34.0)
MCHC: 33.6 g/dL (ref 30.0–36.0)
MCV: 90.5 fL (ref 78.0–100.0)
Platelets: 242 10*3/uL (ref 150–400)
RBC: 4.21 MIL/uL — AB (ref 4.22–5.81)
RDW: 15.6 % — ABNORMAL HIGH (ref 11.5–15.5)
WBC: 13.2 10*3/uL — AB (ref 4.0–10.5)

## 2016-10-27 LAB — URINE CULTURE

## 2016-10-27 LAB — HEMOGLOBIN A1C
HEMOGLOBIN A1C: 8.5 % — AB (ref 4.8–5.6)
MEAN PLASMA GLUCOSE: 197 mg/dL

## 2016-10-27 LAB — PHOSPHORUS: PHOSPHORUS: 2.3 mg/dL — AB (ref 2.5–4.6)

## 2016-10-27 LAB — MAGNESIUM: Magnesium: 1.9 mg/dL (ref 1.7–2.4)

## 2016-10-27 MED ORDER — ALPRAZOLAM 0.25 MG PO TABS
0.2500 mg | ORAL_TABLET | Freq: Three times a day (TID) | ORAL | Status: DC | PRN
  Administered 2016-10-27: 0.25 mg via ORAL
  Administered 2016-10-27: 0.5 mg via ORAL
  Filled 2016-10-27 (×2): qty 2

## 2016-10-27 MED ORDER — INSULIN ASPART 100 UNIT/ML ~~LOC~~ SOLN
0.0000 [IU] | Freq: Three times a day (TID) | SUBCUTANEOUS | Status: DC
  Administered 2016-10-28: 3 [IU] via SUBCUTANEOUS
  Administered 2016-10-28: 5 [IU] via SUBCUTANEOUS
  Administered 2016-10-28: 3 [IU] via SUBCUTANEOUS
  Administered 2016-10-28: 8 [IU] via SUBCUTANEOUS
  Administered 2016-10-29 (×2): 5 [IU] via SUBCUTANEOUS
  Administered 2016-10-29: 2 [IU] via SUBCUTANEOUS
  Administered 2016-10-29: 5 [IU] via SUBCUTANEOUS
  Administered 2016-10-30 (×2): 8 [IU] via SUBCUTANEOUS
  Administered 2016-10-30 – 2016-10-31 (×3): 3 [IU] via SUBCUTANEOUS

## 2016-10-27 MED ORDER — ONDANSETRON HCL 4 MG/2ML IJ SOLN
4.0000 mg | Freq: Four times a day (QID) | INTRAMUSCULAR | Status: DC | PRN
  Administered 2016-10-27: 4 mg via INTRAVENOUS
  Filled 2016-10-27 (×2): qty 2

## 2016-10-27 MED ORDER — FUROSEMIDE 10 MG/ML IJ SOLN
40.0000 mg | Freq: Once | INTRAMUSCULAR | Status: AC
  Administered 2016-10-27: 40 mg via INTRAVENOUS
  Filled 2016-10-27: qty 4

## 2016-10-27 MED ORDER — RIVAROXABAN 20 MG PO TABS
20.0000 mg | ORAL_TABLET | Freq: Every day | ORAL | Status: DC
  Administered 2016-10-27 – 2016-10-28 (×2): 20 mg via ORAL
  Filled 2016-10-27 (×2): qty 1

## 2016-10-27 MED ORDER — CARVEDILOL 6.25 MG PO TABS
6.2500 mg | ORAL_TABLET | Freq: Two times a day (BID) | ORAL | Status: DC
  Administered 2016-10-27 (×2): 6.25 mg via ORAL
  Filled 2016-10-27 (×2): qty 1

## 2016-10-27 NOTE — Progress Notes (Signed)
ANTICOAGULATION CONSULT NOTE - Initial Consult  Pharmacy Consult for Xarelto Indication: atrial fibrillation  No Known Allergies  Patient Measurements: Height: 5\' 7"  (170.2 cm) Weight: 282 lb 3.2 oz (128 kg) (a scale) IBW/kg (Calculated) : 66.1  Vital Signs: Temp: 98.3 F (36.8 C) (12/04 0355) Temp Source: Oral (12/04 0355) BP: 121/83 (12/04 0355) Pulse Rate: 83 (12/04 0355)   Assessment: 55 yo M presented with sepsis. Was on Xarelto 20mg  PTA for Afib but held for possible procedures. Now pharmacy to restart home Xarelto. Hgb stable at 12.8, plts wnl. No s/s of bleed. SCr trending down to 1.32, CrCl ~67ml/min.  Goal of Therapy:  Monitor platelets by anticoagulation protocol: Yes   Plan:  Restart Xarelto 20mg  PO daily with breakfast Monitor CBC, s/s of bleed  Enzo Bi, PharmD, BCPS Clinical Pharmacist Pager [phone] 10/27/2016 7:22 AM

## 2016-10-27 NOTE — Progress Notes (Signed)
PROGRESS NOTE  Jeremiah Perkins ZOX:[id number] DOB: 1961-08-21 DOA: 10/25/2016 PCP: Kirt Boysarter, Monica, DO   LOS: 2 days   Brief Narrative: 55 yo M with CHFrEF, NICM, A fib, HTN, HLD, COPD, admitted to ICU on 12/2 with septic shock in the setting of UTI/Pyelonephrotis, requiring pressors. Transferred to floor 12/4 and [facility] took over.  Assessment & Plan: Active Problems:   Chronic systolic heart failure (HCC)   Diabetes mellitus type 2, controlled, with complications (HCC)   Morbid obesity (HCC)   Long term current use of anticoagulant therapy   Chronic kidney disease (CKD), stage III (moderate)   Essential hypertension   Pyelonephritis   Sepsis (HCC)   Septic shock due to UTI / pyelonephritis - shock physiology resolved, he is off pressors with good BP - continue Ceftriaxone while awaiting culture data, NGTD  Acute on chronic CHFrEF, NICM - patient fluid resuscitated in the setting of shock, now since shock resolved will attempt diuresis - Weight 271 on admission >> 282 this morning (outpatient weight 271 in on Nov 30th 2017) - most recent 2D echo Jan 2017 EF 35-40% - Prior cath without significant obstructive disease per Dr. Elease HashimotoNahser notes - resume Coreg today as well at a lower dose   AKI on CKD III - Cr elevation in the setting of sepsis, improving, closely monitor while on lasix  Atrial fibrillation with intermittent RVR - patient's CHA2DS2-VASc Score for Stroke Risk is greater then 2, continue xarelto - rates 100-130s, resume Coreg as above  DM - A1C 8.5 this admission, poorly controlled - continue SSI  HTN - monitor on Coreg and Lasix   Hyponatremia - likely related to fluid overload, monitor on Lasix   Urinary retention - s/p Foley on admission, voiding trial today, remove Foley catheter - bladder scan prn if unable to void    DVT prophylaxis: Xarelto Code Status: Full Family Communication: d/w wife bedside Disposition Plan: home when ready  Consultants:     PCCM  Procedures:   None   Antimicrobials:  Ceftriaxone 12/2 >>   Subjective: - no chest pain, no abdominal pain, nausea or vomiting.  - complains of shortness of breath when laying flat and ambulating   Objective: Vitals:   10/26/16 2053 10/27/16 0122 10/27/16 0355 10/27/16 0857  BP: 93/69 115/73 121/83 114/79  Pulse: (!) 117 (!) 120 83 (!) 118  Resp:  18 18 18   Temp: 97.8 F (36.6 C) 98.1 F (36.7 C) 98.3 F (36.8 C) 98.4 F (36.9 C)  TempSrc: Oral Oral Oral Oral  SpO2: 99% 93% 100% 98%  Weight:   128 kg (282 lb 3.2 oz)   Height:        Intake/Output Summary (Last 24 hours) at 10/27/16 0918 Last data filed at 10/27/16 0856  Gross per 24 hour  Intake             1365 ml  Output             1725 ml  Net             -360 ml   Filed Weights   10/26/16 0100 10/26/16 1400 10/27/16 0355  Weight: 126.6 kg (279 lb 1.6 oz) 128 kg (282 lb 3.2 oz) 128 kg (282 lb 3.2 oz)    Examination: Constitutional: NAD Vitals:   10/26/16 2053 10/27/16 0122 10/27/16 0355 10/27/16 0857  BP: 93/69 115/73 121/83 114/79  Pulse: (!) 117 (!) 120 83 (!) 118  Resp:  18 18 18   Temp:  97.8 F (36.6 C) 98.1 F (36.7 C) 98.3 F (36.8 C) 98.4 F (36.9 C)  TempSrc: Oral Oral Oral Oral  SpO2: 99% 93% 100% 98%  Weight:   128 kg (282 lb 3.2 oz)   Height:       Eyes: lids and conjunctivae normal ENMT: Mucous membranes are moist.  Respiratory: faint bibasilar crackles, no wheezing  Cardiovascular: irregular, tachycardic. No MRG. 1+ LE edema Abdomen: no tenderness. Bowel sounds positive.  Neurologic: non focal, ambulatory    Data Reviewed: I have personally reviewed following labs and imaging studies  CBC:  Recent Labs Lab 10/25/16 1637 10/26/16 0643 10/27/16 0258  WBC 17.3* 16.8* 13.2*  NEUTROABS 15.3*  --   --   HGB 14.6 12.7* 12.8*  HCT 43.8 39.4 38.1*  MCV 91.1 91.4 90.5  PLT 264 237 242   Basic Metabolic Panel:  Recent Labs Lab 10/23/16 1132 10/25/16 1637  10/26/16 0643 10/27/16 0258  NA 136 133*  --  133*  K 3.9 4.9  --  3.6  CL 97* 96*  --  102  CO2 30 24  --  20*  GLUCOSE 219* 195*  --  116*  BUN 35* 21*  --  25*  CREATININE 1.16 1.27* 1.92* 1.32*  CALCIUM 8.7 9.2  --  8.6*  MG  --   --   --  1.9  PHOS  --   --   --  2.3*   GFR: Estimated Creatinine Clearance: 79.4 mL/min (by C-G formula based on SCr of 1.32 mg/dL (H)). Liver Function Tests:  Recent Labs Lab 10/25/16 1637  AST 27  ALT 20  ALKPHOS 80  BILITOT 2.1*  PROT 7.6  ALBUMIN 3.0*   No results for input(s): LIPASE, AMYLASE in the last 168 hours. No results for input(s): AMMONIA in the last 168 hours. Coagulation Profile:  Recent Labs Lab 10/25/16 1637  INR 1.29   Cardiac Enzymes: No results for input(s): CKTOTAL, CKMB, CKMBINDEX, TROPONINI in the last 168 hours. BNP (last 3 results) No results for input(s): PROBNP in the last 8760 hours. HbA1C:  Recent Labs  10/26/16 0228  HGBA1C 8.5*   CBG:  Recent Labs Lab 10/26/16 1625 10/26/16 2011 10/27/16 0015 10/27/16 0350 10/27/16 0902  GLUCAP 210* 157* 118* 114* 161*   Lipid Profile: No results for input(s): CHOL, HDL, LDLCALC, TRIG, CHOLHDL, LDLDIRECT in the last 72 hours. Thyroid Function Tests: No results for input(s): TSH, T4TOTAL, FREET4, T3FREE, THYROIDAB in the last 72 hours. Anemia Panel: No results for input(s): VITAMINB12, FOLATE, FERRITIN, TIBC, IRON, RETICCTPCT in the last 72 hours. Urine analysis:    Component Value Date/Time   COLORURINE ORANGE (A) 10/25/2016 1608   APPEARANCEUR CLOUDY (A) 10/25/2016 1608   LABSPEC 1.021 10/25/2016 1608   PHURINE 5.0 10/25/2016 1608   GLUCOSEU NEGATIVE 10/25/2016 1608   HGBUR SMALL (A) 10/25/2016 1608   BILIRUBINUR SMALL (A) 10/25/2016 1608   KETONESUR 15 (A) 10/25/2016 1608   PROTEINUR 100 (A) 10/25/2016 1608   UROBILINOGEN 0.2 07/16/2015 1845   NITRITE POSITIVE (A) 10/25/2016 1608   LEUKOCYTESUR MODERATE (A) 10/25/2016 1608   Sepsis  Labs: Invalid input(s): PROCALCITONIN, LACTICIDVEN  Recent Results (from the past 240 hour(s))  Urine culture     Status: Abnormal   Collection Time: 10/25/16  4:08 PM  Result Value Ref Range Status   Specimen Description URINE, CLEAN CATCH  Final   Special Requests NONE  Final   Culture 20,000 COLONIES/mL ESCHERICHIA COLI (A)  Final  Report Status 10/27/2016 FINAL  Final   Organism ID, Bacteria ESCHERICHIA COLI (A)  Final      Susceptibility   Escherichia coli - MIC*    AMPICILLIN 4 SENSITIVE Sensitive     CEFAZOLIN <=4 SENSITIVE Sensitive     CEFTRIAXONE <=1 SENSITIVE Sensitive     CIPROFLOXACIN <=0.25 SENSITIVE Sensitive     GENTAMICIN <=1 SENSITIVE Sensitive     IMIPENEM <=0.25 SENSITIVE Sensitive     NITROFURANTOIN <=16 SENSITIVE Sensitive     TRIMETH/SULFA <=20 SENSITIVE Sensitive     AMPICILLIN/SULBACTAM <=2 SENSITIVE Sensitive     PIP/TAZO <=4 SENSITIVE Sensitive     Extended ESBL NEGATIVE Sensitive     * 20,000 COLONIES/mL ESCHERICHIA COLI  Culture, blood (Routine x 2)     Status: None (Preliminary result)   Collection Time: 10/25/16  4:09 PM  Result Value Ref Range Status   Specimen Description BLOOD RIGHT ANTECUBITAL  Final   Special Requests BOTTLES DRAWN AEROBIC AND ANAEROBIC 5CC  Final   Culture NO GROWTH < 24 HOURS  Final   Report Status PENDING  Incomplete  Culture, blood (Routine x 2)     Status: None (Preliminary result)   Collection Time: 10/25/16  4:40 PM  Result Value Ref Range Status   Specimen Description BLOOD RIGHT FOREARM  Final   Special Requests BOTTLES DRAWN AEROBIC AND ANAEROBIC 4CC  Final   Culture NO GROWTH < 24 HOURS  Final   Report Status PENDING  Incomplete  MRSA PCR Screening     Status: None   Collection Time: 10/26/16  1:39 AM  Result Value Ref Range Status   MRSA by PCR NEGATIVE NEGATIVE Final    Comment:        The GeneXpert MRSA Assay (FDA approved for NASAL specimens only), is one component of a comprehensive MRSA  colonization surveillance program. It is not intended to diagnose MRSA infection nor to guide or monitor treatment for MRSA infections.       Radiology Studies: Ct Abdomen Pelvis Wo Contrast  Result Date: 10/26/2016 CLINICAL DATA:  Pyelonephritis in is ICU patient. EXAM: CT ABDOMEN AND PELVIS WITHOUT CONTRAST TECHNIQUE: Multidetector CT imaging of the abdomen and pelvis was performed following the standard protocol without IV contrast. COMPARISON:  February 05, 2015 FINDINGS: Lower chest: Scattered air trapping in the lung bases. No focal infiltrate or overt edema. Hepatobiliary: The patient is status post cholecystectomy. No focal abnormalities seen within the liver. There is increased attenuation in the fat of the porta hepatis such as on series 2, image 33. While the pancreatic head is in close proximity, the increased attenuation in the fat does not appear to arise from the pancreas. The adjacent duodenum is not definitively thick walled. There is no free air is suggest ulceration. Several prominent nodes remain in the porta hepatis, similar to slightly more prominent in the interval. Pancreas: No focal mass seen in the pancreas. There is fatty deposition within the pancreatic head. While there is stranding in the porta hepatis, this is not appear to arise from the pancreas. Spleen: Normal in size without focal abnormality. Adrenals/Urinary Tract: The adrenal glands are normal. No renal stones or obstruction. No perinephric stranding on the right. There is some increased attenuation adjacent to the left kidney, likely sequela of recent pyelonephritis. No ureterectasis or ureteral stone. The bladder is decompressed with a Foley catheter. There is significant fat stranding around the decompressed bladder. Stomach/Bowel: The stomach and visualized small bowel are normal. Diverticulosis is  seen in the colon. Mild stranding adjacent to the colon on series 2, image 56 near the junction of the descending and  sigmoid colon suggests diverticulitis. Another region of mild stranding adjacent to the descending colon on image 47 also suggests diverticulitis. The remainder of the colon is unremarkable. The appendix is normal. Vascular/Lymphatic: Atherosclerosis in the iliac vessels. No aneurysm. No other adenopathy not described above. Reproductive: Prostate is unremarkable. Other: No other abnormalities. Musculoskeletal: No change in the bones. IMPRESSION: 1. The bladder is decompressed with a Foley catheter. Stranding around the bladder is consistent with continued cystitis. Mild increased attenuation adjacent to the left kidney is likely due to sequela of recent pyelonephritis. 2. Two sites of mild diverticulitis in the descending and sigmoid colon. 3. Increased attenuation in the fat of the porta hepatis. The etiology is unclear. Based on location of the stranding, I do not favor pancreatitis. Recommend correlation with labs to exclude elevated lipase. The gallbladder has been removed. The adjacent duodenum is not clearly abnormal. The cause of this stranding cannot be clearly identified on this unenhanced study. Recommend clinical correlation. 4. Scattered air trapping in the lung bases. These results will be called to the ordering clinician or representative by the Radiologist Assistant, and communication documented in the PACS or zVision Dashboard. Electronically Signed   By: Gerome Sam III M.D   On: 10/26/2016 16:08   Dg Chest 2 View  Result Date: 10/25/2016 CLINICAL DATA:  Shortness of breath. Cough. Chills. Nausea vomiting. EXAM: CHEST  2 VIEW COMPARISON:  Two-view chest x-ray a 08/20/2016 FINDINGS: The heart is enlarged. Mild edema is present. No focal airspace disease is evident. There are no effusions. The visualized soft tissues and bony thorax are unremarkable. IMPRESSION: 1. Cardiomegaly and mild edema compatible with congestive heart failure. 2. No focal airspace consolidation. Electronically Signed    By: Marin Roberts M.D.   On: 10/25/2016 16:38   US Renal  Result Date: 10/26/2016 CLINICAL DATA:  Pyelonephritis EXAM: RENAL / URINARY TRACT ULTRASOUND COMPLETE COMPARISON:  None. FINDINGS: Right Kidney: Length: 13.1 cm. Echogenicity within normal limits. No mass or hydronephrosis visualized. Left Kidney: Length: 12.7 cm. Echogenicity within normal limits. No mass or hydronephrosis visualized. Bladder: The bladder is decompressed with a Foley catheter. IMPRESSION: 1. No acute abnormalities identified. The bladder is decompressed and not well assessed. Electronically Signed   By: Gerome Sam III M.D   On: 10/26/2016 14:35     Scheduled Meds: . carvedilol  6.25 mg Oral BID WC  . cefTRIAXone (ROCEPHIN)  IV  1 g Intravenous Q24H  . furosemide  40 mg Intravenous Once  . insulin aspart  0-15 Units Subcutaneous Q4H  . rivaroxaban  20 mg Oral Q breakfast   Continuous Infusions:   Pamella Pert, MD, PhD [facility] Pager [phone]  If 7PM-7AM, please contact night-coverage [email or web address] Password [email or web address] 10/27/2016, 9:18 AM

## 2016-10-27 NOTE — Progress Notes (Signed)
Patient with complaints of nausea, no active vomiting, MD Gherge notified Stanford Breed 11:27 AM 10-27-2016

## 2016-10-28 DIAGNOSIS — Z6841 Body Mass Index (BMI) 40.0 and over, adult: Secondary | ICD-10-CM | POA: Diagnosis not present

## 2016-10-28 DIAGNOSIS — A4151 Sepsis due to Escherichia coli [E. coli]: Secondary | ICD-10-CM | POA: Diagnosis not present

## 2016-10-28 DIAGNOSIS — N12 Tubulo-interstitial nephritis, not specified as acute or chronic: Secondary | ICD-10-CM | POA: Diagnosis not present

## 2016-10-28 DIAGNOSIS — I5023 Acute on chronic systolic (congestive) heart failure: Secondary | ICD-10-CM | POA: Diagnosis not present

## 2016-10-28 DIAGNOSIS — N179 Acute kidney failure, unspecified: Secondary | ICD-10-CM | POA: Diagnosis not present

## 2016-10-28 DIAGNOSIS — J9601 Acute respiratory failure with hypoxia: Secondary | ICD-10-CM | POA: Diagnosis not present

## 2016-10-28 DIAGNOSIS — E118 Type 2 diabetes mellitus with unspecified complications: Secondary | ICD-10-CM | POA: Diagnosis not present

## 2016-10-28 DIAGNOSIS — E1122 Type 2 diabetes mellitus with diabetic chronic kidney disease: Secondary | ICD-10-CM | POA: Diagnosis not present

## 2016-10-28 DIAGNOSIS — I4891 Unspecified atrial fibrillation: Secondary | ICD-10-CM | POA: Diagnosis not present

## 2016-10-28 DIAGNOSIS — R6521 Severe sepsis with septic shock: Secondary | ICD-10-CM | POA: Diagnosis not present

## 2016-10-28 DIAGNOSIS — I4892 Unspecified atrial flutter: Secondary | ICD-10-CM | POA: Diagnosis not present

## 2016-10-28 DIAGNOSIS — E1142 Type 2 diabetes mellitus with diabetic polyneuropathy: Secondary | ICD-10-CM | POA: Diagnosis not present

## 2016-10-28 DIAGNOSIS — I5022 Chronic systolic (congestive) heart failure: Secondary | ICD-10-CM | POA: Diagnosis not present

## 2016-10-28 DIAGNOSIS — N183 Chronic kidney disease, stage 3 (moderate): Secondary | ICD-10-CM | POA: Diagnosis not present

## 2016-10-28 LAB — BASIC METABOLIC PANEL
ANION GAP: 13 (ref 5–15)
BUN: 42 mg/dL — ABNORMAL HIGH (ref 6–20)
CHLORIDE: 103 mmol/L (ref 101–111)
CO2: 18 mmol/L — ABNORMAL LOW (ref 22–32)
Calcium: 8.7 mg/dL — ABNORMAL LOW (ref 8.9–10.3)
Creatinine, Ser: 2.03 mg/dL — ABNORMAL HIGH (ref 0.61–1.24)
GFR, EST AFRICAN AMERICAN: 40 mL/min — AB (ref >60)
GFR, EST NON AFRICAN AMERICAN: 35 mL/min — AB (ref >60)
Glucose, Bld: 160 mg/dL — ABNORMAL HIGH (ref 65–99)
POTASSIUM: 4.6 mmol/L (ref 3.5–5.1)
SODIUM: 134 mmol/L — AB (ref 135–145)

## 2016-10-28 LAB — CBC
HEMATOCRIT: 37.4 % — AB (ref 39.0–52.0)
HEMOGLOBIN: 12.4 g/dL — AB (ref 13.0–17.0)
MCH: 29.5 pg (ref 26.0–34.0)
MCHC: 33.2 g/dL (ref 30.0–36.0)
MCV: 89 fL (ref 78.0–100.0)
Platelets: 234 10*3/uL (ref 150–400)
RBC: 4.2 MIL/uL — AB (ref 4.22–5.81)
RDW: 15.7 % — AB (ref 11.5–15.5)
WBC: 6.8 10*3/uL (ref 4.0–10.5)

## 2016-10-28 LAB — GLUCOSE, CAPILLARY
GLUCOSE-CAPILLARY: 268 mg/dL — AB (ref 65–99)
GLUCOSE-CAPILLARY: 241 mg/dL — AB (ref 65–99)
GLUCOSE-CAPILLARY: 192 mg/dL — AB (ref 65–99)
Glucose-Capillary: 179 mg/dL — ABNORMAL HIGH (ref 65–99)
Glucose-Capillary: 173 mg/dL — ABNORMAL HIGH (ref 65–99)

## 2016-10-28 MED ORDER — CARVEDILOL 12.5 MG PO TABS
12.5000 mg | ORAL_TABLET | Freq: Two times a day (BID) | ORAL | Status: DC
  Administered 2016-10-28 (×2): 12.5 mg via ORAL
  Filled 2016-10-28 (×2): qty 1

## 2016-10-28 MED ORDER — THIAMINE HCL 100 MG/ML IJ SOLN: 100.0000 mg | Freq: Every day | INTRAMUSCULAR | Status: DC

## 2016-10-28 MED ORDER — LORAZEPAM 2 MG/ML IJ SOLN
0.5000 mg | Freq: Once | INTRAMUSCULAR | Status: AC
  Administered 2016-10-28: 0.5 mg via INTRAVENOUS
  Filled 2016-10-28: qty 1

## 2016-10-28 MED ORDER — FUROSEMIDE 10 MG/ML IJ SOLN
40.0000 mg | Freq: Two times a day (BID) | INTRAMUSCULAR | Status: DC
  Administered 2016-10-28: 40 mg via INTRAVENOUS
  Filled 2016-10-28: qty 4

## 2016-10-28 MED ORDER — LORAZEPAM 2 MG/ML IJ SOLN: 2.0000 mg | Freq: Once | INTRAMUSCULAR | Status: DC

## 2016-10-28 MED ORDER — DOCUSATE SODIUM 100 MG PO CAPS
100.0000 mg | ORAL_CAPSULE | Freq: Every day | ORAL | Status: DC
  Administered 2016-10-28 – 2016-11-01 (×5): 100 mg via ORAL
  Filled 2016-10-28 (×6): qty 1

## 2016-10-28 MED ORDER — LORAZEPAM 2 MG/ML IJ SOLN: 1.0000 mg | Freq: Four times a day (QID) | INTRAMUSCULAR | Status: DC | PRN

## 2016-10-28 MED ORDER — AMOXICILLIN-POT CLAVULANATE 875-125 MG PO TABS
1.0000 | ORAL_TABLET | Freq: Two times a day (BID) | ORAL | Status: DC
  Administered 2016-10-28 – 2016-11-01 (×9): 1 via ORAL
  Filled 2016-10-28 (×9): qty 1

## 2016-10-28 MED ORDER — VITAMIN B-1 100 MG PO TABS
100.0000 mg | ORAL_TABLET | Freq: Every day | ORAL | Status: DC
  Administered 2016-10-28 – 2016-11-01 (×5): 100 mg via ORAL
  Filled 2016-10-28 (×5): qty 1

## 2016-10-28 MED ORDER — ADULT MULTIVITAMIN W/MINERALS CH
1.0000 | ORAL_TABLET | Freq: Every day | ORAL | Status: DC
  Administered 2016-10-28 – 2016-11-01 (×5): 1 via ORAL
  Filled 2016-10-28 (×5): qty 1

## 2016-10-28 MED ORDER — FOLIC ACID 1 MG PO TABS
1.0000 mg | ORAL_TABLET | Freq: Every day | ORAL | Status: DC
  Administered 2016-10-28 – 2016-11-01 (×5): 1 mg via ORAL
  Filled 2016-10-28 (×5): qty 1

## 2016-10-28 MED ORDER — LORAZEPAM 1 MG PO TABS
1.0000 mg | ORAL_TABLET | Freq: Four times a day (QID) | ORAL | Status: DC | PRN
Start: 2016-10-28 — End: 2016-10-31
  Administered 2016-10-30: 1 mg via ORAL
  Filled 2016-10-28: qty 1

## 2016-10-28 NOTE — Progress Notes (Signed)
Patient still combative and trying to get out of the bed. Nursing staff was able to assist him from bed to chair. Patient diaphoretic.O2 2L in place.  CBG 179. Patient wife at bedside. Patient  was able to calm down and sleeping  at this time.  Will continue to monitor.

## 2016-10-28 NOTE — Significant Event (Signed)
Rapid Response Event Note RN called for HR 140's Overview: Time Called: 0000 Arrival Time: 0002 Event Type: Cardiac  Initial Focused Assessment: Pt alert, oriented x4, skin warm and dry, lungs clear. Pt agitated stating, "Let me lay down, I'm tired."  BP 164/130, HR 141, RR 28, 100% [place] 2L  Interventions: Paged Schorr NP PTA RRT arrival. New orders for ativan 0.5 mg IVP given and completed by [employer]. This RN also spoke with schorr NP about HR, EKG resulting in A-fib with RVR. BP 164/109, HR 83, RR 22, 100% 2L    Plan of Care (if not transferred): Updated Silvestre Mesi RN on plan of care discussed with Schorr NP.  Silvestre Mesi RN encouraged to call with any new or ongoing concerns Event Summary: Name of Physician Notified: Schorr NP  at 0002    at    Outcome: Stayed in room and stabalized     Jeremiah Perkins, Jeremiah Perkins

## 2016-10-28 NOTE — Progress Notes (Signed)
Patient having A.fluttter with HR 110-134. Patient very anxious and confused at times trying to remove oxygen and cardiac monitor.  Xanax  0.5 mg given at 21:25. Xopenex also given for SOB. O2 2L in place. Charge nurse and  Rapid response nurse notified and immediately arrived in the unit. MD notified. Rechecked B/P 164/110/ HR 83.Ativan 0.5 mg given as per MD ordered. Patient's wife at bedside. Will continue to monitor.

## 2016-10-28 NOTE — Progress Notes (Addendum)
PROGRESS NOTE  Jeremiah Perkins WJX:[id number] DOB: 1961-05-15 DOA: 10/25/2016 PCP: Kirt Boys, DO   LOS: 3 days   Brief Narrative: 55 yo M with CHFrEF, NICM, A fib, HTN, HLD, COPD, admitted to ICU on 12/2 with septic shock in the setting of UTI/Pyelonephrotis, requiring pressors. Transferred to floor 12/4 and TRH took over. Currently with mild fluid overload in the setting of fluid resuscitation, getting IV diuresis.  Assessment & Plan: Active Problems:   Chronic systolic heart failure (HCC)   Diabetes mellitus type 2, controlled, with complications (HCC)   Morbid obesity (HCC)   Long term current use of anticoagulant therapy   Chronic kidney disease (CKD), stage III (moderate)   Essential hypertension   Pyelonephritis   Sepsis (HCC)   Septic shock due to UTI / pyelonephritis - shock physiology resolved, he is off pressors with good BP - He was initially placed on IV ceftriaxone, his urine cultures showed Escherichia coli pansensitive, narrow antibiotics to Augmentin will need to 10-14 day course.  Acute on chronic CHFrEF, NICM - patient fluid resuscitated in the setting of shock, now since shock resolved will attempt diuresis - Weight 271 on admission >> 282 >> 282 this morning (outpatient weight 271 in on Nov 30th 2017) - most recent 2D echo Jan 2017 EF 35-40% - Prior cath without significant obstructive disease per Dr. Elease Hashimoto notes - resumed Coreg 12/4 at a lower dose, increased today to full home dose as blood pressure allows - Still with clinical evidence of fluid overload, Lasix IV twice a day  AKI on CKD III - Cr elevation in the setting of sepsis, improving, closely monitor while on lasix - Repeat BMP this morning pending  Addendum: Cr elevated at 2, hold pm lasix dose  Atrial fibrillation with intermittent RVR - patient's CHA2DS2-VASc Score for Stroke Risk is greater then 2, continue xarelto - rates 100-130s, resume Coreg as above and increase dose to his home  regimen today  Intermittent confusion / agitation - New onset overnight last night, patient denies heavy alcohol abuse however wife states that he has been drinking in the few days prior to coming to the hospital, place patient on CIWA  DM - A1C 8.5 this admission, poorly controlled - continue SSI, CBGs controlled this morning  HTN - monitor on Coreg and Lasix   Hyponatremia - likely related to fluid overload, monitor on Lasix   Urinary retention - s/p Foley on admission, voiding trial today, removde Foley catheter 12/4 - bladder scan prn if unable to void    DVT prophylaxis: Xarelto Code Status: Full Family Communication: d/w wife bedside Disposition Plan: home when ready, likely 1-2 more days of IV Lasix needed  Consultants:   PCCM  Procedures:   None   Antimicrobials:  Ceftriaxone 12/2 >> 12/5  Augmentin 12/5 >>  Subjective: - Doesn't have much recollection about overnight events, has no chest pain, no shortness of breath this morning. Denies any abdominal pain, nausea or vomiting. Does not appear tremulous today  Objective: Vitals:   10/28/16 0002 10/28/16 0009 10/28/16 0025 10/28/16 0251  BP:  (!) 164/130 (!) 164/109   Pulse: (!) 134 (!) 141 83   Resp:  (!) 28    Temp:  98.8 F (37.1 C)    TempSrc:  Oral    SpO2:  100%  99%  Weight:      Height:        Intake/Output Summary (Last 24 hours) at 10/28/16 1001 Last data filed at 10/28/16  0677  Gross per 24 hour  Intake              360 ml  Output              326 ml  Net               34 ml   Filed Weights   10/26/16 0100 10/26/16 1400 10/27/16 0355  Weight: 126.6 kg (279 lb 1.6 oz) 128 kg (282 lb 3.2 oz) 128 kg (282 lb 3.2 oz)    Examination: Constitutional: NAD Vitals:   10/28/16 0002 10/28/16 0009 10/28/16 0025 10/28/16 0251  BP:  (!) 164/130 (!) 164/109   Pulse: (!) 134 (!) 141 83   Resp:  (!) 28    Temp:  98.8 F (37.1 C)    TempSrc:  Oral    SpO2:  100%  99%  Weight:      Height:        Eyes: lids and conjunctivae normal ENMT: Mucous membranes are moist.  Respiratory: faint bibasilar crackles, no wheezing  Cardiovascular: irregular, tachycardic. No MRG. 1+ LE edema Abdomen: no tenderness. Bowel sounds positive.  Neurologic: non focal, ambulatory    Data Reviewed: I have personally reviewed following labs and imaging studies  CBC:  Recent Labs Lab 10/25/16 1637 10/26/16 0643 10/27/16 0258  WBC 17.3* 16.8* 13.2*  NEUTROABS 15.3*  --   --   HGB 14.6 12.7* 12.8*  HCT 43.8 39.4 38.1*  MCV 91.1 91.4 90.5  PLT 264 237 242   Basic Metabolic Panel:  Recent Labs Lab 10/23/16 1132 10/25/16 1637 10/26/16 0643 10/27/16 0258  NA 136 133*  --  133*  K 3.9 4.9  --  3.6  CL 97* 96*  --  102  CO2 30 24  --  20*  GLUCOSE 219* 195*  --  116*  BUN 35* 21*  --  25*  CREATININE 1.16 1.27* 1.92* 1.32*  CALCIUM 8.7 9.2  --  8.6*  MG  --   --   --  1.9  PHOS  --   --   --  2.3*   GFR: Estimated Creatinine Clearance: 79.4 mL/min (by C-G formula based on SCr of 1.32 mg/dL (H)). Liver Function Tests:  Recent Labs Lab 10/25/16 1637  AST 27  ALT 20  ALKPHOS 80  BILITOT 2.1*  PROT 7.6  ALBUMIN 3.0*   No results for input(s): LIPASE, AMYLASE in the last 168 hours. No results for input(s): AMMONIA in the last 168 hours. Coagulation Profile:  Recent Labs Lab 10/25/16 1637  INR 1.29   Cardiac Enzymes: No results for input(s): CKTOTAL, CKMB, CKMBINDEX, TROPONINI in the last 168 hours. BNP (last 3 results) No results for input(s): PROBNP in the last 8760 hours. HbA1C:  Recent Labs  10/26/16 0228  HGBA1C 8.5*   CBG:  Recent Labs Lab 10/27/16 1123 10/27/16 1704 10/27/16 2110 10/28/16 0227 10/28/16 0705  GLUCAP 183* 188* 189* 179* 173*   Lipid Profile: No results for input(s): CHOL, HDL, LDLCALC, TRIG, CHOLHDL, LDLDIRECT in the last 72 hours. Thyroid Function Tests: No results for input(s): TSH, T4TOTAL, FREET4, T3FREE, THYROIDAB in the last  72 hours. Anemia Panel: No results for input(s): VITAMINB12, FOLATE, FERRITIN, TIBC, IRON, RETICCTPCT in the last 72 hours. Urine analysis:    Component Value Date/Time   COLORURINE ORANGE (A) 10/25/2016 1608   APPEARANCEUR CLOUDY (A) 10/25/2016 1608   LABSPEC 1.021 10/25/2016 1608   PHURINE 5.0 10/25/2016 1608  GLUCOSEU NEGATIVE 10/25/2016 1608   HGBUR SMALL (A) 10/25/2016 1608   BILIRUBINUR SMALL (A) 10/25/2016 1608   KETONESUR 15 (A) 10/25/2016 1608   PROTEINUR 100 (A) 10/25/2016 1608   UROBILINOGEN 0.2 07/16/2015 1845   NITRITE POSITIVE (A) 10/25/2016 1608   LEUKOCYTESUR MODERATE (A) 10/25/2016 1608   Sepsis Labs: Invalid input(s): PROCALCITONIN, LACTICIDVEN  Recent Results (from the past 240 hour(s))  Urine culture     Status: Abnormal   Collection Time: 10/25/16  4:08 PM  Result Value Ref Range Status   Specimen Description URINE, CLEAN CATCH  Final   Special Requests NONE  Final   Culture 20,000 COLONIES/mL ESCHERICHIA COLI (A)  Final   Report Status 10/27/2016 FINAL  Final   Organism ID, Bacteria ESCHERICHIA COLI (A)  Final      Susceptibility   Escherichia coli - MIC*    AMPICILLIN 4 SENSITIVE Sensitive     CEFAZOLIN <=4 SENSITIVE Sensitive     CEFTRIAXONE <=1 SENSITIVE Sensitive     CIPROFLOXACIN <=0.25 SENSITIVE Sensitive     GENTAMICIN <=1 SENSITIVE Sensitive     IMIPENEM <=0.25 SENSITIVE Sensitive     NITROFURANTOIN <=16 SENSITIVE Sensitive     TRIMETH/SULFA <=20 SENSITIVE Sensitive     AMPICILLIN/SULBACTAM <=2 SENSITIVE Sensitive     PIP/TAZO <=4 SENSITIVE Sensitive     Extended ESBL NEGATIVE Sensitive     * 20,000 COLONIES/mL ESCHERICHIA COLI  Culture, blood (Routine x 2)     Status: None (Preliminary result)   Collection Time: 10/25/16  4:09 PM  Result Value Ref Range Status   Specimen Description BLOOD RIGHT ANTECUBITAL  Final   Special Requests BOTTLES DRAWN AEROBIC AND ANAEROBIC 5CC  Final   Culture NO GROWTH 2 DAYS  Final   Report Status PENDING   Incomplete  Culture, blood (Routine x 2)     Status: None (Preliminary result)   Collection Time: 10/25/16  4:40 PM  Result Value Ref Range Status   Specimen Description BLOOD RIGHT FOREARM  Final   Special Requests BOTTLES DRAWN AEROBIC AND ANAEROBIC 4CC  Final   Culture NO GROWTH 2 DAYS  Final   Report Status PENDING  Incomplete  MRSA PCR Screening     Status: None   Collection Time: 10/26/16  1:39 AM  Result Value Ref Range Status   MRSA by PCR NEGATIVE NEGATIVE Final    Comment:        The GeneXpert MRSA Assay (FDA approved for NASAL specimens only), is one component of a comprehensive MRSA colonization surveillance program. It is not intended to diagnose MRSA infection nor to guide or monitor treatment for MRSA infections.       Radiology Studies: Ct Abdomen Pelvis Wo Contrast  Result Date: 10/26/2016 CLINICAL DATA:  Pyelonephritis in is ICU patient. EXAM: CT ABDOMEN AND PELVIS WITHOUT CONTRAST TECHNIQUE: Multidetector CT imaging of the abdomen and pelvis was performed following the standard protocol without IV contrast. COMPARISON:  February 05, 2015 FINDINGS: Lower chest: Scattered air trapping in the lung bases. No focal infiltrate or overt edema. Hepatobiliary: The patient is status post cholecystectomy. No focal abnormalities seen within the liver. There is increased attenuation in the fat of the porta hepatis such as on series 2, image 33. While the pancreatic head is in close proximity, the increased attenuation in the fat does not appear to arise from the pancreas. The adjacent duodenum is not definitively thick walled. There is no free air is suggest ulceration. Several prominent nodes remain in  the porta hepatis, similar to slightly more prominent in the interval. Pancreas: No focal mass seen in the pancreas. There is fatty deposition within the pancreatic head. While there is stranding in the porta hepatis, this is not appear to arise from the pancreas. Spleen: Normal in  size without focal abnormality. Adrenals/Urinary Tract: The adrenal glands are normal. No renal stones or obstruction. No perinephric stranding on the right. There is some increased attenuation adjacent to the left kidney, likely sequela of recent pyelonephritis. No ureterectasis or ureteral stone. The bladder is decompressed with a Foley catheter. There is significant fat stranding around the decompressed bladder. Stomach/Bowel: The stomach and visualized small bowel are normal. Diverticulosis is seen in the colon. Mild stranding adjacent to the colon on series 2, image 56 near the junction of the descending and sigmoid colon suggests diverticulitis. Another region of mild stranding adjacent to the descending colon on image 47 also suggests diverticulitis. The remainder of the colon is unremarkable. The appendix is normal. Vascular/Lymphatic: Atherosclerosis in the iliac vessels. No aneurysm. No other adenopathy not described above. Reproductive: Prostate is unremarkable. Other: No other abnormalities. Musculoskeletal: No change in the bones. IMPRESSION: 1. The bladder is decompressed with a Foley catheter. Stranding around the bladder is consistent with continued cystitis. Mild increased attenuation adjacent to the left kidney is likely due to sequela of recent pyelonephritis. 2. Two sites of mild diverticulitis in the descending and sigmoid colon. 3. Increased attenuation in the fat of the porta hepatis. The etiology is unclear. Based on location of the stranding, I do not favor pancreatitis. Recommend correlation with labs to exclude elevated lipase. The gallbladder has been removed. The adjacent duodenum is not clearly abnormal. The cause of this stranding cannot be clearly identified on this unenhanced study. Recommend clinical correlation. 4. Scattered air trapping in the lung bases. These results will be called to the ordering clinician or representative by the Radiologist Assistant, and communication  documented in the PACS or zVision Dashboard. Electronically Signed   By: Gerome Sam III M.D   On: 10/26/2016 16:08   US Renal  Result Date: 10/26/2016 CLINICAL DATA:  Pyelonephritis EXAM: RENAL / URINARY TRACT ULTRASOUND COMPLETE COMPARISON:  None. FINDINGS: Right Kidney: Length: 13.1 cm. Echogenicity within normal limits. No mass or hydronephrosis visualized. Left Kidney: Length: 12.7 cm. Echogenicity within normal limits. No mass or hydronephrosis visualized. Bladder: The bladder is decompressed with a Foley catheter. IMPRESSION: 1. No acute abnormalities identified. The bladder is decompressed and not well assessed. Electronically Signed   By: Gerome Sam III M.D   On: 10/26/2016 14:35     Scheduled Meds: . carvedilol  12.5 mg Oral BID WC  . cefTRIAXone (ROCEPHIN)  IV  1 g Intravenous Q24H  . folic acid  1 mg Oral Daily  . furosemide  40 mg Intravenous Q12H  . insulin aspart  0-15 Units Subcutaneous TID AC & HS  . LORazepam  2 mg Intravenous Once  . multivitamin with minerals  1 tablet Oral Daily  . rivaroxaban  20 mg Oral Q breakfast  . thiamine  100 mg Oral Daily   Or  . thiamine  100 mg Intravenous Daily   Continuous Infusions:   Pamella Pert, MD, PhD [facility] Pager [phone]  If 7PM-7AM, please contact night-coverage [email or web address] Password [email or web address] 10/28/2016, 10:01 AM

## 2016-10-28 NOTE — Progress Notes (Signed)
Patient agitated, confused, combative, cursing, not following commands, removing oxygen.cardiac monitor and trying to get out of the bed . Patient being not cooperative to stay in bed for safety precauiton and forcefully  positioned himself that he is about to fall. Security called for assistance.  Patient still combative and still trying to get out of bed. MD notified.

## 2016-10-28 NOTE — Consult Note (Signed)
   [facility] CM Inpatient Consult   10/28/2016  Jeremiah Perkins August 09, 1961 [id number]  Patient screened for potential [facility] Care Management services for restart of services.  Patient is eligible for [facility] Care Management services under patient's [employer] plan. Came by to speak with the patient who is sitting up in bed sound asleep.  Awaken to loud verbal introduction.  Nurse tech states patient is somnelent all day.  Had been combative yesterday. Patient currently cooperative but very sleepy.  Will follow up appropriately with progress and needs. Will update inpatient RNCM.  Please place a [facility] Care Management consult or for questions contact:   Charlesetta Shanks, RN BSN CCM [facility]  [phone] business mobile phone Toll free office [phone]

## 2016-10-28 NOTE — Progress Notes (Signed)
Md was requested to have restrain for a patient earlier because of being combative but at this time pt is calming down and sleepy so this is in hold per request of wife for now, meanwhile lab was requested to draw lab little bit later since pt just trying to have sleep, will continue to monitor later and act accordingly.

## 2016-10-29 DIAGNOSIS — A4151 Sepsis due to Escherichia coli [E. coli]: Secondary | ICD-10-CM | POA: Diagnosis not present

## 2016-10-29 LAB — GLUCOSE, CAPILLARY
GLUCOSE-CAPILLARY: 150 mg/dL — AB (ref 65–99)
GLUCOSE-CAPILLARY: 214 mg/dL — AB (ref 65–99)
Glucose-Capillary: 238 mg/dL — ABNORMAL HIGH (ref 65–99)
Glucose-Capillary: 248 mg/dL — ABNORMAL HIGH (ref 65–99)

## 2016-10-29 LAB — CBC
HCT: 38.2 % — ABNORMAL LOW (ref 39.0–52.0)
HEMOGLOBIN: 12.5 g/dL — AB (ref 13.0–17.0)
MCH: 29.2 pg (ref 26.0–34.0)
MCHC: 32.7 g/dL (ref 30.0–36.0)
MCV: 89.3 fL (ref 78.0–100.0)
Platelets: 225 10*3/uL (ref 150–400)
RBC: 4.28 MIL/uL (ref 4.22–5.81)
RDW: 15.5 % (ref 11.5–15.5)
WBC: 6.4 10*3/uL (ref 4.0–10.5)

## 2016-10-29 LAB — BASIC METABOLIC PANEL
ANION GAP: 15 (ref 5–15)
BUN: 43 mg/dL — ABNORMAL HIGH (ref 6–20)
CHLORIDE: 99 mmol/L — AB (ref 101–111)
CO2: 19 mmol/L — AB (ref 22–32)
Calcium: 8.7 mg/dL — ABNORMAL LOW (ref 8.9–10.3)
Creatinine, Ser: 1.49 mg/dL — ABNORMAL HIGH (ref 0.61–1.24)
GFR calc non Af Amer: 50 mL/min — ABNORMAL LOW (ref >60)
GFR, EST AFRICAN AMERICAN: 58 mL/min — AB (ref >60)
Glucose, Bld: 180 mg/dL — ABNORMAL HIGH (ref 65–99)
POTASSIUM: 4.4 mmol/L (ref 3.5–5.1)
Sodium: 133 mmol/L — ABNORMAL LOW (ref 135–145)

## 2016-10-29 MED ORDER — CARVEDILOL 12.5 MG PO TABS
12.5000 mg | ORAL_TABLET | Freq: Two times a day (BID) | ORAL | Status: DC
  Administered 2016-10-29 – 2016-11-01 (×7): 12.5 mg via ORAL
  Filled 2016-10-29 (×7): qty 1

## 2016-10-29 MED ORDER — CARVEDILOL 12.5 MG PO TABS: 12.5000 mg | ORAL_TABLET | Freq: Two times a day (BID) | ORAL | Status: DC

## 2016-10-29 MED ORDER — RIVAROXABAN 20 MG PO TABS
20.0000 mg | ORAL_TABLET | Freq: Every day | ORAL | Status: DC
  Administered 2016-10-29 – 2016-11-01 (×4): 20 mg via ORAL
  Filled 2016-10-29 (×4): qty 1

## 2016-10-29 MED ORDER — FUROSEMIDE 80 MG PO TABS
80.0000 mg | ORAL_TABLET | Freq: Every day | ORAL | Status: DC
  Administered 2016-10-29: 80 mg via ORAL
  Filled 2016-10-29: qty 1

## 2016-10-29 MED ORDER — MORPHINE SULFATE (PF) 2 MG/ML IV SOLN
2.0000 mg | INTRAVENOUS | Status: DC | PRN
  Administered 2016-10-29: 2 mg via INTRAVENOUS
  Filled 2016-10-29: qty 1

## 2016-10-29 MED ORDER — RIVAROXABAN 20 MG PO TABS: 20.0000 mg | ORAL_TABLET | Freq: Every day | ORAL | Status: DC

## 2016-10-29 NOTE — Care Management Important Message (Signed)
Important Message  Patient Details  Name: DRYDEN JARROW MRN: [id number] Date of Birth: [birth year]   Medicare Important Message Given:       Dorena Bodo 10/29/2016, 10:17 AM

## 2016-10-29 NOTE — Evaluation (Signed)
Physical Therapy Evaluation Patient Details Name: Jeremiah Perkins MRN: [id number] DOB: [birth year] Today's Date: 10/29/2016   History of Present Illness  Pt is a 55 y/o male admitted secondary to SOB and found to have pyelonephritis. PMH including but not limited to A-fib, CHF, COPD, DM and diabetic neuropathy.  Clinical Impression  Pt presented sitting OOB in recliner when PT entered room. Pt reported that he got OOB and to the recliner this morning by himself, no AD or staff member assistance. Prior to admission, pt reported that he was independent with all functional mobility. This session was limited secondary pt's lethargy and fatigue. Pt would continue to benefit from skilled physical therapy services at this time while admitted and after d/c to address his below listed limitations in order to improve his overall safety and independence with functional mobility.     Follow Up Recommendations Home health PT    Equipment Recommendations  None recommended by PT    Recommendations for Other Services       Precautions / Restrictions Precautions Precautions: Fall Restrictions Weight Bearing Restrictions: No      Mobility  Bed Mobility               General bed mobility comments: pt sitting OOB in recliner when PT entered room  Transfers Overall transfer level: Needs assistance Equipment used: None Transfers: Sit to/from Stand Sit to Stand: Supervision         General transfer comment: pt required increased time, supervision for safety  Ambulation/Gait             General Gait Details: deferred secondary to lethargy and pt's lunch tray arrived at that time  Stairs            Wheelchair Mobility    Modified Rankin (Stroke Patients Only)       Balance Overall balance assessment: Needs assistance Sitting-balance support: Feet supported;No upper extremity supported Sitting balance-Leahy Scale: Good     Standing balance support: During  functional activity;No upper extremity supported Standing balance-Leahy Scale: Fair                               Pertinent Vitals/Pain Pain Assessment: No/denies pain    Home Living Family/patient expects to be discharged to:: Private residence Living Arrangements: Spouse/significant other Available Help at Discharge: Family;Available 24 hours/day Type of Home: House Home Access: Level entry     Home Layout: Multi-level Home Equipment: Walker - 2 wheels;Cane - single point Additional Comments: pt reported that his bedroom is on the main level; however, his full bathroom is on the second floor    Prior Function Level of Independence: Independent               Hand Dominance   Dominant Hand: Right    Extremity/Trunk Assessment   Upper Extremity Assessment: Overall WFL for tasks assessed           Lower Extremity Assessment: Generalized weakness      Cervical / Trunk Assessment: Normal  Communication   Communication: No difficulties  Cognition Arousal/Alertness: Lethargic Behavior During Therapy: WFL for tasks assessed/performed;Flat affect Overall Cognitive Status: Within Functional Limits for tasks assessed                      General Comments      Exercises     Assessment/Plan    PT Assessment Patient needs continued PT services  PT Problem List Decreased strength;Decreased activity tolerance;Decreased balance;Decreased mobility;Decreased coordination;Cardiopulmonary status limiting activity          PT Treatment Interventions DME instruction;Gait training;Stair training;Functional mobility training;Therapeutic activities;Therapeutic exercise;Balance training;Neuromuscular re-education;Patient/family education    PT Goals (Current goals can be found in the Care Plan section)  Acute Rehab PT Goals Patient Stated Goal: return home PT Goal Formulation: With patient Time For Goal Achievement: 11/12/16 Potential to Achieve  Goals: Fair    Frequency Min 3X/week   Barriers to discharge        Co-evaluation               End of Session Equipment Utilized During Treatment: Oxygen (3L of O2 via [place]) Activity Tolerance: Patient limited by lethargy;Patient limited by fatigue Patient left: in chair;with call bell/phone within reach Nurse Communication: Mobility status         Time: 1252-1303 PT Time Calculation (min) (ACUTE ONLY): 11 min   Charges:   PT Evaluation $PT Eval Moderate Complexity: 1 Procedure     PT G CodesAlessandra Perkins Jeremiah Perkins 10/29/2016, 1:47 PM Jeremiah Perkins, PT, DPT [phone]

## 2016-10-29 NOTE — Progress Notes (Signed)
PROGRESS NOTE    Jeremiah Perkins  ZOX:[id number] DOB: [birth year] DOA: 10/25/2016 PCP: Kirt Boysarter, Monica, DO     Brief Narrative:  Jeremiah Perkins is a 55 yo M with CHFrEF, NICM, A fib, HTN, HLD, COPD, admitted to ICU on 12/2 with septic shock in the setting of UTI/Pyelonephrotis, requiring pressors. Transferred to floor 12/4 and TRH took over. Currently with mild fluid overload in the setting of fluid resuscitation, getting IV diuresis.    Assessment & Plan:   Active Problems:   Chronic systolic heart failure (HCC)   Diabetes mellitus type 2, controlled, with complications (HCC)   Morbid obesity (HCC)   Long term current use of anticoagulant therapy   Chronic kidney disease (CKD), stage III (moderate)   Essential hypertension   Pyelonephritis   Sepsis (HCC)  Septic shock due to E Coli UTI POA  - Shock physiology resolved, he is off pressors with good BP - Blood cultures negative to date  - He was initially placed on IV ceftriaxone, his urine cultures showed Escherichia coli pansensitive, narrow antibiotics to Augmentin will need to 10-14 day course   Acute on chronic systolic heart failure, NICM - Patient fluid resuscitated in the setting of shock, now since shock resolved will attempt diuresis - Most recent 2D echo Jan 2017 EF 35-40%. Prior cath without significant obstructive disease per Dr. Elease HashimotoNahser notes - Weight 271 on admission >> 282 >> 278  - Continue Coreg  - Continue home lasix 80mg  PO daily  - I/Os  - Daily weights   Acute hypoxemic respiratory failure - Due to above - [place] O2   AKI on CKD III - Baseline Cr 1.2 - Worsened in setting of sepsis, lasix, now improving  - Trend   Atrial fibrillation with intermittent RVR - Patient's CHA2DS2-VASc Score > 2, continue xarelto - Continue coreg   Intermittent confusion / agitation - New onset overnight 12/4, patient denies heavy alcohol abuse however wife states that he has been drinking in the few days prior to  coming to the hospital, place patient on CIWA  DM type 2  - A1C 8.5 this admission, poorly controlled - continue SSI  HTN - Monitor on Coreg and Lasix  - Stable   Urinary retention - S/p Foley on admission, voiding trial, removed Foley catheter 12/4 - Bladder scan prn if unable to void    DVT prophylaxis: on Xarelto Code Status: Full Family Communication: no family at bedside Disposition Plan: pending further improvement    Consultants:   PCCM  Procedures:   None  Antimicrobials:   Ceftriaxone 12/2 >> 12/5  Augmentin 12/5 >>   Subjective: Patient sitting comfortably on his chair. He continues to complain of shortness of breath, denies any chest pain. Still has some peripheral edema bilaterally.  Objective: Vitals:   10/28/16 0251 10/28/16 1215 10/29/16 0029 10/29/16 0607  BP:  (!) 110/94 102/71 106/81  Pulse:  89 96 76  Resp:  18 20 18   Temp:  98.5 F (36.9 C) 97.9 F (36.6 C) 99.3 F (37.4 C)  TempSrc:  Oral Oral Oral  SpO2: 99% 99% 100% 100%  Weight:    126.4 kg (278 lb 11.2 oz)  Height:        Intake/Output Summary (Last 24 hours) at 10/29/16 0726 Last data filed at 10/29/16 0430  Gross per 24 hour  Intake             1080 ml  Output  600 ml  Net              480 ml   Filed Weights   10/26/16 1400 10/27/16 0355 10/29/16 0607  Weight: 128 kg (282 lb 3.2 oz) 128 kg (282 lb 3.2 oz) 126.4 kg (278 lb 11.2 oz)    Examination:  General exam: Appears calm and comfortable  Respiratory system: Diminished breath sounds. Respiratory effort normal. On [place] O2.  Cardiovascular system: S1 & S2 heard, irregular rhythm . No JVD, murmurs, rubs, gallops or clicks. +1 pedal edema. Gastrointestinal system: Abdomen is nondistended, soft and nontender. No organomegaly or masses felt. Normal bowel sounds heard. Central nervous system: Alert and oriented. No focal neurological deficits. Extremities: Symmetric 5 x 5 power. Skin: No rashes, lesions or  ulcers Psychiatry: Judgement and insight appear normal. Mood & affect appropriate.   Data Reviewed: I have personally reviewed following labs and imaging studies  CBC:  Recent Labs Lab 10/25/16 1637 10/26/16 0643 10/27/16 0258 10/28/16 0922 10/29/16 0445  WBC 17.3* 16.8* 13.2* 6.8 6.4  NEUTROABS 15.3*  --   --   --   --   HGB 14.6 12.7* 12.8* 12.4* 12.5*  HCT 43.8 39.4 38.1* 37.4* 38.2*  MCV 91.1 91.4 90.5 89.0 89.3  PLT 264 237 242 234 225   Basic Metabolic Panel:  Recent Labs Lab 10/23/16 1132 10/25/16 1637 10/26/16 0643 10/27/16 0258 10/28/16 0922 10/29/16 0445  NA 136 133*  --  133* 134* 133*  K 3.9 4.9  --  3.6 4.6 4.4  CL 97* 96*  --  102 103 99*  CO2 30 24  --  20* 18* 19*  GLUCOSE 219* 195*  --  116* 160* 180*  BUN 35* 21*  --  25* 42* 43*  CREATININE 1.16 1.27* 1.92* 1.32* 2.03* 1.49*  CALCIUM 8.7 9.2  --  8.6* 8.7* 8.7*  MG  --   --   --  1.9  --   --   PHOS  --   --   --  2.3*  --   --    GFR: Estimated Creatinine Clearance: 69.8 mL/min (by C-G formula based on SCr of 1.49 mg/dL (H)). Liver Function Tests:  Recent Labs Lab 10/25/16 1637  AST 27  ALT 20  ALKPHOS 80  BILITOT 2.1*  PROT 7.6  ALBUMIN 3.0*   No results for input(s): LIPASE, AMYLASE in the last 168 hours. No results for input(s): AMMONIA in the last 168 hours. Coagulation Profile:  Recent Labs Lab 10/25/16 1637  INR 1.29   Cardiac Enzymes: No results for input(s): CKTOTAL, CKMB, CKMBINDEX, TROPONINI in the last 168 hours. BNP (last 3 results) No results for input(s): PROBNP in the last 8760 hours. HbA1C: No results for input(s): HGBA1C in the last 72 hours. CBG:  Recent Labs Lab 10/28/16 0705 10/28/16 1214 10/28/16 1719 10/28/16 2159 10/29/16 0635  GLUCAP 173* 268* 241* 192* 150*   Lipid Profile: No results for input(s): CHOL, HDL, LDLCALC, TRIG, CHOLHDL, LDLDIRECT in the last 72 hours. Thyroid Function Tests: No results for input(s): TSH, T4TOTAL, FREET4,  T3FREE, THYROIDAB in the last 72 hours. Anemia Panel: No results for input(s): VITAMINB12, FOLATE, FERRITIN, TIBC, IRON, RETICCTPCT in the last 72 hours. Sepsis Labs:  Recent Labs Lab 10/25/16 1624 10/25/16 1912  LATICACIDVEN 2.34* 1.77    Recent Results (from the past 240 hour(s))  Urine culture     Status: Abnormal   Collection Time: 10/25/16  4:08 PM  Result Value Ref  Range Status   Specimen Description URINE, CLEAN CATCH  Final   Special Requests NONE  Final   Culture 20,000 COLONIES/mL ESCHERICHIA COLI (A)  Final   Report Status 10/27/2016 FINAL  Final   Organism ID, Bacteria ESCHERICHIA COLI (A)  Final      Susceptibility   Escherichia coli - MIC*    AMPICILLIN 4 SENSITIVE Sensitive     CEFAZOLIN <=4 SENSITIVE Sensitive     CEFTRIAXONE <=1 SENSITIVE Sensitive     CIPROFLOXACIN <=0.25 SENSITIVE Sensitive     GENTAMICIN <=1 SENSITIVE Sensitive     IMIPENEM <=0.25 SENSITIVE Sensitive     NITROFURANTOIN <=16 SENSITIVE Sensitive     TRIMETH/SULFA <=20 SENSITIVE Sensitive     AMPICILLIN/SULBACTAM <=2 SENSITIVE Sensitive     PIP/TAZO <=4 SENSITIVE Sensitive     Extended ESBL NEGATIVE Sensitive     * 20,000 COLONIES/mL ESCHERICHIA COLI  Culture, blood (Routine x 2)     Status: None (Preliminary result)   Collection Time: 10/25/16  4:09 PM  Result Value Ref Range Status   Specimen Description BLOOD RIGHT ANTECUBITAL  Final   Special Requests BOTTLES DRAWN AEROBIC AND ANAEROBIC 5CC  Final   Culture NO GROWTH 3 DAYS  Final   Report Status PENDING  Incomplete  Culture, blood (Routine x 2)     Status: None (Preliminary result)   Collection Time: 10/25/16  4:40 PM  Result Value Ref Range Status   Specimen Description BLOOD RIGHT FOREARM  Final   Special Requests BOTTLES DRAWN AEROBIC AND ANAEROBIC 4CC  Final   Culture NO GROWTH 3 DAYS  Final   Report Status PENDING  Incomplete  MRSA PCR Screening     Status: None   Collection Time: 10/26/16  1:39 AM  Result Value Ref Range  Status   MRSA by PCR NEGATIVE NEGATIVE Final    Comment:        The GeneXpert MRSA Assay (FDA approved for NASAL specimens only), is one component of a comprehensive MRSA colonization surveillance program. It is not intended to diagnose MRSA infection nor to guide or monitor treatment for MRSA infections.        Radiology Studies: No results found.    Scheduled Meds: . amoxicillin-clavulanate  1 tablet Oral Q12H  . carvedilol  12.5 mg Oral BID WC  . docusate sodium  100 mg Oral Daily  . folic acid  1 mg Oral Daily  . insulin aspart  0-15 Units Subcutaneous TID AC & HS  . LORazepam  2 mg Intravenous Once  . multivitamin with minerals  1 tablet Oral Daily  . rivaroxaban  20 mg Oral Q breakfast  . thiamine  100 mg Oral Daily   Or  . thiamine  100 mg Intravenous Daily   Continuous Infusions:   LOS: 4 days    Time spent: 40 minutes   Noralee Stain, DO [facility] [email or web address] Password [email or web address] 10/29/2016, 7:26 AM

## 2016-10-30 DIAGNOSIS — A4151 Sepsis due to Escherichia coli [E. coli]: Secondary | ICD-10-CM | POA: Diagnosis not present

## 2016-10-30 LAB — BASIC METABOLIC PANEL
Anion gap: 10 (ref 5–15)
BUN: 38 mg/dL — AB (ref 6–20)
CO2: 27 mmol/L (ref 22–32)
CREATININE: 1.17 mg/dL (ref 0.61–1.24)
Calcium: 8.6 mg/dL — ABNORMAL LOW (ref 8.9–10.3)
Chloride: 98 mmol/L — ABNORMAL LOW (ref 101–111)
GFR calc Af Amer: >60 mL/min (ref >60)
GLUCOSE: 168 mg/dL — AB (ref 65–99)
POTASSIUM: 3.7 mmol/L (ref 3.5–5.1)
Sodium: 135 mmol/L (ref 135–145)

## 2016-10-30 LAB — CULTURE, BLOOD (ROUTINE X 2)
CULTURE: NO GROWTH
Culture: NO GROWTH

## 2016-10-30 LAB — GLUCOSE, CAPILLARY
GLUCOSE-CAPILLARY: 253 mg/dL — AB (ref 65–99)
Glucose-Capillary: 161 mg/dL — ABNORMAL HIGH (ref 65–99)
Glucose-Capillary: 194 mg/dL — ABNORMAL HIGH (ref 65–99)
Glucose-Capillary: 265 mg/dL — ABNORMAL HIGH (ref 65–99)

## 2016-10-30 MED ORDER — DICLOFENAC SODIUM 1 % TD GEL: 2.0000 g | Freq: Four times a day (QID) | TRANSDERMAL | Status: DC | PRN

## 2016-10-30 MED ORDER — DICLOFENAC SODIUM 1 % TD GEL
2.0000 g | Freq: Four times a day (QID) | TRANSDERMAL | Status: DC | PRN
  Administered 2016-10-30: 22:00:00 2 g via TOPICAL
  Filled 2016-10-30: qty 100

## 2016-10-30 MED ORDER — HYDROCODONE-ACETAMINOPHEN 5-325 MG PO TABS
1.0000 | ORAL_TABLET | ORAL | Status: DC | PRN
  Administered 2016-10-30 – 2016-11-01 (×3): 2 via ORAL
  Filled 2016-10-30 (×3): qty 2

## 2016-10-30 MED ORDER — INSULIN GLARGINE 100 UNIT/ML ~~LOC~~ SOLN
14.0000 [IU] | Freq: Every day | SUBCUTANEOUS | Status: DC
  Administered 2016-10-30 – 2016-11-01 (×3): 14 [IU] via SUBCUTANEOUS
  Filled 2016-10-30 (×3): qty 0.14

## 2016-10-30 MED ORDER — FUROSEMIDE 10 MG/ML IJ SOLN
80.0000 mg | Freq: Two times a day (BID) | INTRAMUSCULAR | Status: DC
  Administered 2016-10-30 – 2016-11-01 (×5): 80 mg via INTRAVENOUS
  Filled 2016-10-30 (×5): qty 8

## 2016-10-30 NOTE — Care Management Important Message (Signed)
Important Message  Patient Details  Name: Jeremiah Perkins MRN: [id number] Date of Birth: 20-Jan-1961   Medicare Important Message Given:  Yes    Krystine Pabst 10/30/2016, 10:05 AM

## 2016-10-30 NOTE — Consult Note (Signed)
   [facility] CM Inpatient Consult   10/30/2016  Jeremiah Perkins 08/20/1961 [id number]   Follow up with the patient for restart of services needs.  Patient is, per MD notes,  Jeremiah Perkins is a 55 yo M with CHFrEF, NICM, A fib, HTN, HLD, COPD, admitted to ICU on 12/2 with septic shock in the setting of UTI/Pyelonephrotis, requiring pressors. Transferred to floor 12/4. Patient is up in chair and states he is not feeling well today.  Patient did accept this Clinical research associate for follow up.  Explained to the patient that [facility] Care Management is a benefit of his [employer] plan and post follow up needs.  Patient states he would like to participate in the post hospital follow up.  Consent form signed.  [facility] does not interfere with or replace any services arranged by the inpatient care management team.  For question, please contact:  Charlesetta Shanks, RN BSN CCM [facility]  [phone] business mobile phone Toll free office [phone]

## 2016-10-30 NOTE — Progress Notes (Signed)
Patient is complaining of persistent shortness of breath at rest. Patient is visibly labored. Lungs are diminished. Patient has significant bilateral lower extremity edema. Lasix has been given as ordered. RN will reassess.

## 2016-10-30 NOTE — Progress Notes (Signed)
OT Cancellation Note  Patient Details Name: Jeremiah Perkins MRN: [id number] DOB: 10-31-1961   Cancelled Treatment:    Reason Eval/Treat Not Completed: Other (comment).  Pt just returned from bathroom and did not want to perform any further activities. Briefly educated on energy conservation. Will return tomorrow.  Jennifier Smitherman 10/30/2016, 3:39 PM  Marica Otter, OTR/L [phone] 10/30/2016

## 2016-10-30 NOTE — Progress Notes (Signed)
Patient ambulated out of the room into the hallway independently with a front wheel walker. He was short of breath with this activity, but saturation remains with defined limits in the high 90's.

## 2016-10-30 NOTE — Progress Notes (Signed)
PROGRESS NOTE    Jeremiah Perkins  ZOX:[id number] DOB: [birth year] DOA: 10/25/2016 PCP: Kirt Boysarter, Monica, DO     Brief Narrative:  Jeremiah Perkins is a 55 yo M with CHFrEF, NICM, A fib, HTN, HLD, COPD, admitted to ICU on 12/2 with septic shock in the setting of UTI/Pyelonephrotis, requiring pressors. Transferred to floor 12/4 and TRH took over. Currently with mild fluid overload in the setting of fluid resuscitation, getting IV diuresis.    Assessment & Plan:   Principal Problem:   Sepsis (HCC) Active Problems:   Chronic systolic heart failure (HCC)   Diabetes mellitus type 2, controlled, with complications (HCC)   Morbid obesity (HCC)   Long term current use of anticoagulant therapy   Chronic kidney disease (CKD), stage III (moderate)   Essential hypertension   Pyelonephritis  Septic shock due to E Coli UTI POA  - Shock physiology resolved, he is off pressors with good BP - Blood cultures negative to date  - He was initially placed on IV ceftriaxone, his urine cultures showed Escherichia coli pansensitive, narrow antibiotics to Augmentin will need to 10-14 day course   Acute on chronic systolic heart failure, NICM - Patient fluid resuscitated in the setting of shock, now since shock resolved will attempt diuresis - Most recent 2D echo Jan 2017 EF 35-40%. Prior cath without significant obstructive disease per Dr. Elease HashimotoNahser notes - Weight 271 on admission >> 282 >> 278 >> 282  - Continue Coreg  - Not improving. Increase lasix to 80mg  IV BID today - Ted hose  - I/Os  - Daily weights   Acute hypoxemic respiratory failure - Due to above - [place] O2  - Home O2 screen   AKI on CKD III - Baseline Cr 1.2 - Worsened in setting of sepsis, lasix, now Cr back to baseline   - Monitor   Atrial fibrillation with intermittent RVR - Patient's CHA2DS2-VASc Score > 2, continue xarelto - Continue coreg   Intermittent confusion / agitation - New onset overnight 12/4, patient denies  heavy alcohol abuse however wife states that he has been drinking in the few days prior to coming to the hospital, place patient on CIWA   DM type 2  - A1C 8.5 this admission, poorly controlled - Lantus 14u daily, continue SSI  HTN - Monitor on Coreg and Lasix  - Stable   Urinary retention - S/p Foley on admission, voiding trial, removed Foley catheter 12/4 - Bladder scan prn if unable to void    DVT prophylaxis: on Xarelto Code Status: Full Family Communication: spouse at bedside  Disposition Plan: pending further improvement    Consultants:   PCCM  Procedures:   None  Antimicrobials:   Ceftriaxone 12/2 >> 12/5  Augmentin 12/5 >>   Subjective: Patient sitting at bedside. Continues to complain of anxiety related to SOB, bilateral peripheral edema.   Objective: Vitals:   10/29/16 0607 10/29/16 1200 10/29/16 2029 10/30/16 0552  BP: 106/81 108/66 (!) 104/52 116/82  Pulse: 76 79 (!) 104 (!) 105  Resp: 18 18 18 18   Temp: 99.3 F (37.4 C) 98.8 F (37.1 C) 97.9 F (36.6 C) 98.9 F (37.2 C)  TempSrc: Oral Oral Oral Oral  SpO2: 100% 100% 96% 100%  Weight: 126.4 kg (278 lb 11.2 oz)   128 kg (282 lb 1.6 oz)  Height:        Intake/Output Summary (Last 24 hours) at 10/30/16 0708 Last data filed at 10/30/16 [id number]  Gross per 24 hour  Intake             1440 ml  Output             2025 ml  Net             -585 ml   Filed Weights   10/27/16 0355 10/29/16 0607 10/30/16 0552  Weight: 128 kg (282 lb 3.2 oz) 126.4 kg (278 lb 11.2 oz) 128 kg (282 lb 1.6 oz)    Examination:  General exam: Appears calm and comfortable  Respiratory system: Diminished breath sounds. Respiratory effort normal. On [place] O2.  Cardiovascular system: S1 & S2 heard, irregular rhythm . No JVD, murmurs, rubs, gallops or clicks. +1-2 pedal edema. Gastrointestinal system: Abdomen is nondistended, soft and nontender. No organomegaly or masses felt. Normal bowel sounds heard. Central nervous  system: Alert and oriented. No focal neurological deficits. Extremities: Symmetric 5 x 5 power. Skin: No rashes, lesions or ulcers Psychiatry: Judgement and insight appear normal. Mood & affect appropriate.   Data Reviewed: I have personally reviewed following labs and imaging studies  CBC:  Recent Labs Lab 10/25/16 1637 10/26/16 0643 10/27/16 0258 10/28/16 0922 10/29/16 0445  WBC 17.3* 16.8* 13.2* 6.8 6.4  NEUTROABS 15.3*  --   --   --   --   HGB 14.6 12.7* 12.8* 12.4* 12.5*  HCT 43.8 39.4 38.1* 37.4* 38.2*  MCV 91.1 91.4 90.5 89.0 89.3  PLT 264 237 242 234 225   Basic Metabolic Panel:  Recent Labs Lab 10/25/16 1637 10/26/16 0643 10/27/16 0258 10/28/16 0922 10/29/16 0445 10/30/16 0231  NA 133*  --  133* 134* 133* 135  K 4.9  --  3.6 4.6 4.4 3.7  CL 96*  --  102 103 99* 98*  CO2 24  --  20* 18* 19* 27  GLUCOSE 195*  --  116* 160* 180* 168*  BUN 21*  --  25* 42* 43* 38*  CREATININE 1.27* 1.92* 1.32* 2.03* 1.49* 1.17  CALCIUM 9.2  --  8.6* 8.7* 8.7* 8.6*  MG  --   --  1.9  --   --   --   PHOS  --   --  2.3*  --   --   --    GFR: Estimated Creatinine Clearance: 89.6 mL/min (by C-G formula based on SCr of 1.17 mg/dL). Liver Function Tests:  Recent Labs Lab 10/25/16 1637  AST 27  ALT 20  ALKPHOS 80  BILITOT 2.1*  PROT 7.6  ALBUMIN 3.0*   No results for input(s): LIPASE, AMYLASE in the last 168 hours. No results for input(s): AMMONIA in the last 168 hours. Coagulation Profile:  Recent Labs Lab 10/25/16 1637  INR 1.29   Cardiac Enzymes: No results for input(s): CKTOTAL, CKMB, CKMBINDEX, TROPONINI in the last 168 hours. BNP (last 3 results) No results for input(s): PROBNP in the last 8760 hours. HbA1C: No results for input(s): HGBA1C in the last 72 hours. CBG:  Recent Labs Lab 10/29/16 0635 10/29/16 1045 10/29/16 1608 10/29/16 2028 10/30/16 0548  GLUCAP 150* 238* 248* 214* 161*   Lipid Profile: No results for input(s): CHOL, HDL, LDLCALC,  TRIG, CHOLHDL, LDLDIRECT in the last 72 hours. Thyroid Function Tests: No results for input(s): TSH, T4TOTAL, FREET4, T3FREE, THYROIDAB in the last 72 hours. Anemia Panel: No results for input(s): VITAMINB12, FOLATE, FERRITIN, TIBC, IRON, RETICCTPCT in the last 72 hours. Sepsis Labs:  Recent Labs Lab 10/25/16 1624 10/25/16 1912  LATICACIDVEN 2.34* 1.77    Recent  Results (from the past 240 hour(s))  Urine culture     Status: Abnormal   Collection Time: 10/25/16  4:08 PM  Result Value Ref Range Status   Specimen Description URINE, CLEAN CATCH  Final   Special Requests NONE  Final   Culture 20,000 COLONIES/mL ESCHERICHIA COLI (A)  Final   Report Status 10/27/2016 FINAL  Final   Organism ID, Bacteria ESCHERICHIA COLI (A)  Final      Susceptibility   Escherichia coli - MIC*    AMPICILLIN 4 SENSITIVE Sensitive     CEFAZOLIN <=4 SENSITIVE Sensitive     CEFTRIAXONE <=1 SENSITIVE Sensitive     CIPROFLOXACIN <=0.25 SENSITIVE Sensitive     GENTAMICIN <=1 SENSITIVE Sensitive     IMIPENEM <=0.25 SENSITIVE Sensitive     NITROFURANTOIN <=16 SENSITIVE Sensitive     TRIMETH/SULFA <=20 SENSITIVE Sensitive     AMPICILLIN/SULBACTAM <=2 SENSITIVE Sensitive     PIP/TAZO <=4 SENSITIVE Sensitive     Extended ESBL NEGATIVE Sensitive     * 20,000 COLONIES/mL ESCHERICHIA COLI  Culture, blood (Routine x 2)     Status: None (Preliminary result)   Collection Time: 10/25/16  4:09 PM  Result Value Ref Range Status   Specimen Description BLOOD RIGHT ANTECUBITAL  Final   Special Requests BOTTLES DRAWN AEROBIC AND ANAEROBIC 5CC  Final   Culture NO GROWTH 3 DAYS  Final   Report Status PENDING  Incomplete  Culture, blood (Routine x 2)     Status: None (Preliminary result)   Collection Time: 10/25/16  4:40 PM  Result Value Ref Range Status   Specimen Description BLOOD RIGHT FOREARM  Final   Special Requests BOTTLES DRAWN AEROBIC AND ANAEROBIC 4CC  Final   Culture NO GROWTH 3 DAYS  Final   Report Status  PENDING  Incomplete  MRSA PCR Screening     Status: None   Collection Time: 10/26/16  1:39 AM  Result Value Ref Range Status   MRSA by PCR NEGATIVE NEGATIVE Final    Comment:        The GeneXpert MRSA Assay (FDA approved for NASAL specimens only), is one component of a comprehensive MRSA colonization surveillance program. It is not intended to diagnose MRSA infection nor to guide or monitor treatment for MRSA infections.        Radiology Studies: No results found.    Scheduled Meds: . amoxicillin-clavulanate  1 tablet Oral Q12H  . carvedilol  12.5 mg Oral BID WC  . docusate sodium  100 mg Oral Daily  . folic acid  1 mg Oral Daily  . furosemide  80 mg Oral Daily  . insulin aspart  0-15 Units Subcutaneous TID AC & HS  . multivitamin with minerals  1 tablet Oral Daily  . rivaroxaban  20 mg Oral Q breakfast  . thiamine  100 mg Oral Daily   Continuous Infusions:   LOS: 5 days    Time spent: 30 minutes   Noralee Stain, DO [facility] [email or web address] Password [email or web address] 10/30/2016, 7:08 AM

## 2016-10-30 NOTE — Progress Notes (Signed)
Physical Therapy Treatment Patient Details Name: Jeremiah Perkins MRN: [id number] DOB: 1961-01-28 Today's Date: 10/30/2016    History of Present Illness Pt is a 55 y/o male admitted secondary to SOB and found to have pyelonephritis. PMH including but not limited to A-fib, CHF, COPD, DM and diabetic neuropathy.    PT Comments    Pt presented sitting OOB in recliner when PT entered room. Pt making progress with mobility and now on RA. Pt's SPO2 maintained >94% throughout session. Pt would continue to benefit from skilled physical therapy services at this time while admitted and after d/c to address his limitations in order to improve his overall safety and independence with functional mobility.   Follow Up Recommendations  Home health PT     Equipment Recommendations  None recommended by PT    Recommendations for Other Services       Precautions / Restrictions Precautions Precautions: Fall Restrictions Weight Bearing Restrictions: No    Mobility  Bed Mobility               General bed mobility comments: pt sitting OOB in recliner when PT entered room  Transfers Overall transfer level: Needs assistance Equipment used: Rolling walker (2 wheeled) Transfers: Sit to/from Stand Sit to Stand: Supervision         General transfer comment: pt required increased time, supervision for safety  Ambulation/Gait Ambulation/Gait assistance: Min guard Ambulation Distance (Feet): 50 Feet Assistive device: Rolling walker (2 wheeled) Gait Pattern/deviations: Step-through pattern;Decreased stride length Gait velocity: decreased Gait velocity interpretation: Below normal speed for age/gender General Gait Details: pt required standing rest breaks x3 during gait secondary to fatigue. Pt ambulated on RA with SPO2 maintaining >94% throughout.   Stairs            Wheelchair Mobility    Modified Rankin (Stroke Patients Only)       Balance Overall balance assessment:  Needs assistance Sitting-balance support: Feet supported;No upper extremity supported Sitting balance-Leahy Scale: Good     Standing balance support: During functional activity;No upper extremity supported Standing balance-Leahy Scale: Fair                      Cognition Arousal/Alertness: Awake/alert Behavior During Therapy: WFL for tasks assessed/performed;Flat affect Overall Cognitive Status: Within Functional Limits for tasks assessed                      Exercises      General Comments        Pertinent Vitals/Pain Pain Assessment: No/denies pain    Home Living                      Prior Function            PT Goals (current goals can now be found in the care plan section) Acute Rehab PT Goals Patient Stated Goal: return home PT Goal Formulation: With patient Time For Goal Achievement: 11/12/16 Potential to Achieve Goals: Fair Progress towards PT goals: Progressing toward goals    Frequency    Min 3X/week      PT Plan Current plan remains appropriate    Co-evaluation             End of Session Equipment Utilized During Treatment: Gait belt Activity Tolerance: Patient limited by fatigue Patient left: in chair;with call bell/phone within reach     Time: 1125-1139 PT Time Calculation (min) (ACUTE ONLY): 14 min  Charges:  $Gait  Training: 8-22 mins                    G Codes:      Alessandra Bevels Latausha Flamm Nov 03, 2016, 11:52 AM Deborah Chalk, PT, DPT [phone]

## 2016-10-30 NOTE — Progress Notes (Signed)
Pt requested TED hose to help decrease leg swelling. MD notified. Orders received. TEDs applied. Will continue to monitor.

## 2016-10-31 ENCOUNTER — Inpatient Hospital Stay (HOSPITAL_COMMUNITY): Payer: PPO

## 2016-10-31 DIAGNOSIS — A4151 Sepsis due to Escherichia coli [E. coli]: Secondary | ICD-10-CM | POA: Diagnosis not present

## 2016-10-31 DIAGNOSIS — M179 Osteoarthritis of knee, unspecified: Secondary | ICD-10-CM | POA: Diagnosis not present

## 2016-10-31 LAB — GLUCOSE, CAPILLARY
GLUCOSE-CAPILLARY: 173 mg/dL — AB (ref 65–99)
GLUCOSE-CAPILLARY: 300 mg/dL — AB (ref 65–99)
GLUCOSE-CAPILLARY: 222 mg/dL — AB (ref 65–99)
Glucose-Capillary: 134 mg/dL — ABNORMAL HIGH (ref 65–99)
Glucose-Capillary: 176 mg/dL — ABNORMAL HIGH (ref 65–99)

## 2016-10-31 LAB — BASIC METABOLIC PANEL
ANION GAP: 12 (ref 5–15)
BUN: 29 mg/dL — ABNORMAL HIGH (ref 6–20)
CALCIUM: 8.6 mg/dL — AB (ref 8.9–10.3)
CO2: 27 mmol/L (ref 22–32)
Chloride: 98 mmol/L — ABNORMAL LOW (ref 101–111)
Creatinine, Ser: 0.93 mg/dL (ref 0.61–1.24)
Glucose, Bld: 215 mg/dL — ABNORMAL HIGH (ref 65–99)
Potassium: 3.6 mmol/L (ref 3.5–5.1)
SODIUM: 137 mmol/L (ref 135–145)

## 2016-10-31 MED ORDER — ZOLPIDEM TARTRATE 5 MG PO TABS
5.0000 mg | ORAL_TABLET | Freq: Once | ORAL | Status: AC
  Administered 2016-10-31: 5 mg via ORAL
  Filled 2016-10-31: qty 1

## 2016-10-31 MED ORDER — TRAMADOL HCL 50 MG PO TABS: 50.0000 mg | ORAL_TABLET | Freq: Four times a day (QID) | ORAL | Status: DC | PRN

## 2016-10-31 MED ORDER — INSULIN ASPART 100 UNIT/ML ~~LOC~~ SOLN
0.0000 [IU] | SUBCUTANEOUS | Status: DC
  Administered 2016-10-31: 3 [IU] via SUBCUTANEOUS
  Administered 2016-10-31: 5 [IU] via SUBCUTANEOUS

## 2016-10-31 MED ORDER — COLCHICINE 0.6 MG PO TABS
0.6000 mg | ORAL_TABLET | Freq: Every day | ORAL | Status: DC
  Administered 2016-10-31: 0.6 mg via ORAL
  Filled 2016-10-31: qty 1

## 2016-10-31 MED ORDER — INSULIN ASPART 100 UNIT/ML ~~LOC~~ SOLN: 0.0000 [IU] | Freq: Every day | SUBCUTANEOUS | Status: DC

## 2016-10-31 MED ORDER — POLYETHYLENE GLYCOL 3350 17 G PO PACK
17.0000 g | PACK | Freq: Every day | ORAL | Status: DC
  Administered 2016-10-31: 17 g via ORAL

## 2016-10-31 MED ORDER — INSULIN ASPART 100 UNIT/ML ~~LOC~~ SOLN
0.0000 [IU] | Freq: Three times a day (TID) | SUBCUTANEOUS | Status: DC
Start: 2016-11-01 — End: 2016-11-01
  Administered 2016-11-01: 3 [IU] via SUBCUTANEOUS

## 2016-10-31 NOTE — Progress Notes (Signed)
Inpatient Diabetes Program Recommendations  AACE/ADA: New Consensus Statement on Inpatient Glycemic Control (2015)  Target Ranges:  Prepandial:   less than 140 mg/dL      Peak postprandial:   less than 180 mg/dL (1-2 hours)      Critically ill patients:  140 - 180 mg/dL   Lab Results  Component Value Date   GLUCAP 173 (H) 10/31/2016   HGBA1C 8.5 (H) 10/26/2016    Review of Glycemic Control  Diabetes history: DM2 Outpatient Diabetes medications: Lantus 14 units qd Current orders for Inpatient glycemic control: Lantus 14 units qd + Novolog correction 0-15 units tid & hs.  Inpatient Diabetes Program Recommendations:   Noted hyperglycemia. If CBGs continue elevated, please consider increasing Novolog correction to q 4 hrs.  Thank you, Billy Fischer. Ocie Tino, RN, MSN, CDE Inpatient Glycemic Control Team Team Pager [phone] (8am-5pm) 10/31/2016 8:24 AM

## 2016-10-31 NOTE — Progress Notes (Signed)
OT Cancellation Note  Patient Details Name: Jeremiah Perkins MRN: [id number] DOB: 1961-06-18   Cancelled Treatment:    Reason Eval/Treat Not Completed: Patient at procedure or test/ unavailable;Other (comment). Attempted x 2 to see pt this morning. Going for x ray with first attempt, upon second attempt pt in bathroom and adamantly declined OT. Pt's RN stated that he is constipated and requested OT give him time to be able to have BM and return later. Will check back on pt later as able  Galen Manila 10/31/2016, 10:34 AM

## 2016-10-31 NOTE — Progress Notes (Signed)
PROGRESS NOTE    Jeremiah Perkins  WGN:[id number] DOB: May 15, 1961 DOA: 10/25/2016 PCP: Kirt Boys, DO     Brief Narrative:  Jeremiah Perkins is a 55 yo M with CHFrEF, NICM, A fib, HTN, HLD, COPD, admitted to ICU on 12/2 with septic shock in the setting of UTI/Pyelonephrotis, requiring pressors. Transferred to floor 12/4 and TRH took over. Currently with mild fluid overload in the setting of fluid resuscitation, getting IV diuresis.    Assessment & Plan:   Principal Problem:   Sepsis (HCC) Active Problems:   Chronic systolic heart failure (HCC)   Diabetes mellitus type 2, controlled, with complications (HCC)   Morbid obesity (HCC)   Long term current use of anticoagulant therapy   Chronic kidney disease (CKD), stage III (moderate)   Essential hypertension   Pyelonephritis  Septic shock due to E Coli UTI POA  - Shock physiology resolved, he is off pressors with good BP - Blood cultures negative to date  - He was initially placed on IV ceftriaxone, his urine cultures showed Escherichia coli pansensitive, narrow antibiotics to Augmentin will need to 14 day course (last day 12/15)   Acute on chronic systolic heart failure, NICM - Patient fluid resuscitated in the setting of shock, now since shock resolved will attempt diuresis - Most recent 2D echo Jan 2017 EF 35-40%. Prior cath without significant obstructive disease per Dr. Elease Hashimoto notes - Weight 271 on admission >> 282 >> 278 >> 282 >> 280  - Continue Coreg  - Ted hose  - I/Os  - Daily weights  - Improving on IV lasix, continue   Acute hypoxemic respiratory failure - Due to above - Pace O2  - Home O2 screen   AKI on CKD III - Baseline Cr 1.2 - Worsened in setting of sepsis, lasix, now Cr back to baseline   - Monitor   Atrial fibrillation with intermittent RVR - Patient's CHA2DS2-VASc Score > 2, continue xarelto - Continue coreg   DM type 2  - A1C 8.5 this admission, poorly controlled - Lantus 14u daily,  continue SSI  HTN - Monitor on Coreg and Lasix  - Stable   Urinary retention - S/p Foley on admission, voiding trial, removed Foley catheter 12/4 - Bladder scan prn if unable to void   Right knee pain - Effusion noted on exam. Xray ordered. Ultram and Voltaren for pain.    DVT prophylaxis: on Xarelto Code Status: Full Family Communication: spouse at bedside  Disposition Plan: pending further improvement    Consultants:   PCCM  Procedures:   None  Antimicrobials:   Ceftriaxone 12/2 >> 12/5  Augmentin 12/5 >> last day 12/15    Subjective: Patient sitting in chair, eating breakfast. He states that his shortness of breath is present when laying in bed, and improved when sitting in chair. Has no other complaints in regards to his breathing today. Is currently on nasal cannula O2. He complains of right knee pain, especially with bearing weight.  Objective: Vitals:   10/30/16 0850 10/30/16 1112 10/30/16 2023 10/31/16 0557  BP:  111/85 111/72 (!) 123/91  Pulse:  (!) 58 (!) 53 (!) 114  Resp:  18 20 18   Temp:  97.8 F (36.6 C) 98 F (36.7 C) 97.9 F (36.6 C)  TempSrc:  Oral Oral Oral  SpO2: 94% 100% 100% 94%  Weight:    127.1 kg (280 lb 4.8 oz)  Height:        Intake/Output Summary (Last 24 hours) at  10/31/16 0921 Last data filed at 10/31/16 0853  Gross per 24 hour  Intake              720 ml  Output             1675 ml  Net             -955 ml   Filed Weights   10/29/16 0607 10/30/16 0552 10/31/16 0557  Weight: 126.4 kg (278 lb 11.2 oz) 128 kg (282 lb 1.6 oz) 127.1 kg (280 lb 4.8 oz)    Examination:  General exam: Appears calm and comfortable  Respiratory system: Diminished breath sounds. Respiratory effort normal.  Cardiovascular system: S1 & S2 heard, irregular rhythm . No JVD, murmurs, rubs, gallops or clicks. +1-2 pedal edema. Gastrointestinal system: Abdomen is nondistended, soft and nontender. No organomegaly or masses felt. Normal bowel sounds  heard. Central nervous system: Alert and oriented. No focal neurological deficits. Extremities: Symmetric 5 x 5 power. Right knee with effusion  Skin: No rashes, lesions or ulcers Psychiatry: Judgement and insight appear normal. Mood & affect appropriate.   Data Reviewed: I have personally reviewed following labs and imaging studies  CBC:  Recent Labs Lab 10/25/16 1637 10/26/16 0643 10/27/16 0258 10/28/16 0922 10/29/16 0445  WBC 17.3* 16.8* 13.2* 6.8 6.4  NEUTROABS 15.3*  --   --   --   --   HGB 14.6 12.7* 12.8* 12.4* 12.5*  HCT 43.8 39.4 38.1* 37.4* 38.2*  MCV 91.1 91.4 90.5 89.0 89.3  PLT 264 237 242 234 225   Basic Metabolic Panel:  Recent Labs Lab 10/27/16 0258 10/28/16 0922 10/29/16 0445 10/30/16 0231 10/31/16 0222  NA 133* 134* 133* 135 137  K 3.6 4.6 4.4 3.7 3.6  CL 102 103 99* 98* 98*  CO2 20* 18* 19* 27 27  GLUCOSE 116* 160* 180* 168* 215*  BUN 25* 42* 43* 38* 29*  CREATININE 1.32* 2.03* 1.49* 1.17 0.93  CALCIUM 8.6* 8.7* 8.7* 8.6* 8.6*  MG 1.9  --   --   --   --   PHOS 2.3*  --   --   --   --    GFR: Estimated Creatinine Clearance: 112.2 mL/min (by C-G formula based on SCr of 0.93 mg/dL). Liver Function Tests:  Recent Labs Lab 10/25/16 1637  AST 27  ALT 20  ALKPHOS 80  BILITOT 2.1*  PROT 7.6  ALBUMIN 3.0*   No results for input(s): LIPASE, AMYLASE in the last 168 hours. No results for input(s): AMMONIA in the last 168 hours. Coagulation Profile:  Recent Labs Lab 10/25/16 1637  INR 1.29   Cardiac Enzymes: No results for input(s): CKTOTAL, CKMB, CKMBINDEX, TROPONINI in the last 168 hours. BNP (last 3 results) No results for input(s): PROBNP in the last 8760 hours. HbA1C: No results for input(s): HGBA1C in the last 72 hours. CBG:  Recent Labs Lab 10/30/16 0548 10/30/16 1109 10/30/16 1626 10/30/16 2056 10/31/16 0629  GLUCAP 161* 253* 194* 265* 173*   Lipid Profile: No results for input(s): CHOL, HDL, LDLCALC, TRIG, CHOLHDL,  LDLDIRECT in the last 72 hours. Thyroid Function Tests: No results for input(s): TSH, T4TOTAL, FREET4, T3FREE, THYROIDAB in the last 72 hours. Anemia Panel: No results for input(s): VITAMINB12, FOLATE, FERRITIN, TIBC, IRON, RETICCTPCT in the last 72 hours. Sepsis Labs:  Recent Labs Lab 10/25/16 1624 10/25/16 1912  LATICACIDVEN 2.34* 1.77    Recent Results (from the past 240 hour(s))  Urine culture  Status: Abnormal   Collection Time: 10/25/16  4:08 PM  Result Value Ref Range Status   Specimen Description URINE, CLEAN CATCH  Final   Special Requests NONE  Final   Culture 20,000 COLONIES/mL ESCHERICHIA COLI (A)  Final   Report Status 10/27/2016 FINAL  Final   Organism ID, Bacteria ESCHERICHIA COLI (A)  Final      Susceptibility   Escherichia coli - MIC*    AMPICILLIN 4 SENSITIVE Sensitive     CEFAZOLIN <=4 SENSITIVE Sensitive     CEFTRIAXONE <=1 SENSITIVE Sensitive     CIPROFLOXACIN <=0.25 SENSITIVE Sensitive     GENTAMICIN <=1 SENSITIVE Sensitive     IMIPENEM <=0.25 SENSITIVE Sensitive     NITROFURANTOIN <=16 SENSITIVE Sensitive     TRIMETH/SULFA <=20 SENSITIVE Sensitive     AMPICILLIN/SULBACTAM <=2 SENSITIVE Sensitive     PIP/TAZO <=4 SENSITIVE Sensitive     Extended ESBL NEGATIVE Sensitive     * 20,000 COLONIES/mL ESCHERICHIA COLI  Culture, blood (Routine x 2)     Status: None   Collection Time: 10/25/16  4:09 PM  Result Value Ref Range Status   Specimen Description BLOOD RIGHT ANTECUBITAL  Final   Special Requests BOTTLES DRAWN AEROBIC AND ANAEROBIC 5CC  Final   Culture NO GROWTH 5 DAYS  Final   Report Status 10/30/2016 FINAL  Final  Culture, blood (Routine x 2)     Status: None   Collection Time: 10/25/16  4:40 PM  Result Value Ref Range Status   Specimen Description BLOOD RIGHT FOREARM  Final   Special Requests BOTTLES DRAWN AEROBIC AND ANAEROBIC 4CC  Final   Culture NO GROWTH 5 DAYS  Final   Report Status 10/30/2016 FINAL  Final  MRSA PCR Screening      Status: None   Collection Time: 10/26/16  1:39 AM  Result Value Ref Range Status   MRSA by PCR NEGATIVE NEGATIVE Final    Comment:        The GeneXpert MRSA Assay (FDA approved for NASAL specimens only), is one component of a comprehensive MRSA colonization surveillance program. It is not intended to diagnose MRSA infection nor to guide or monitor treatment for MRSA infections.        Radiology Studies: No results found.    Scheduled Meds: . amoxicillin-clavulanate  1 tablet Oral Q12H  . carvedilol  12.5 mg Oral BID WC  . docusate sodium  100 mg Oral Daily  . folic acid  1 mg Oral Daily  . furosemide  80 mg Intravenous BID  . insulin aspart  0-15 Units Subcutaneous TID AC & HS  . insulin glargine  14 Units Subcutaneous Daily  . multivitamin with minerals  1 tablet Oral Daily  . rivaroxaban  20 mg Oral Q breakfast  . thiamine  100 mg Oral Daily   Continuous Infusions:   LOS: 6 days    Time spent: 30 minutes   Noralee Stain, DO [facility] [email or web address] Password [email or web address] 10/31/2016, 9:21 AM

## 2016-11-01 DIAGNOSIS — A4151 Sepsis due to Escherichia coli [E. coli]: Secondary | ICD-10-CM | POA: Diagnosis not present

## 2016-11-01 LAB — BASIC METABOLIC PANEL
Anion gap: 11 (ref 5–15)
BUN: 24 mg/dL — AB (ref 6–20)
CALCIUM: 8.3 mg/dL — AB (ref 8.9–10.3)
CO2: 29 mmol/L (ref 22–32)
Chloride: 96 mmol/L — ABNORMAL LOW (ref 101–111)
Creatinine, Ser: 1 mg/dL (ref 0.61–1.24)
GFR calc Af Amer: >60 mL/min (ref >60)
Glucose, Bld: 203 mg/dL — ABNORMAL HIGH (ref 65–99)
POTASSIUM: 3.3 mmol/L — AB (ref 3.5–5.1)
SODIUM: 136 mmol/L (ref 135–145)

## 2016-11-01 LAB — GLUCOSE, CAPILLARY: Glucose-Capillary: 158 mg/dL — ABNORMAL HIGH (ref 65–99)

## 2016-11-01 MED ORDER — FUROSEMIDE 80 MG PO TABS: 80.0000 mg | ORAL_TABLET | Freq: Every day | ORAL | 0 refills | Status: DC

## 2016-11-01 MED ORDER — POTASSIUM CHLORIDE CRYS ER 20 MEQ PO TBCR
40.0000 meq | EXTENDED_RELEASE_TABLET | ORAL | Status: AC
  Administered 2016-11-01 (×2): 40 meq via ORAL
  Filled 2016-11-01 (×2): qty 2

## 2016-11-01 MED ORDER — COLCHICINE 0.6 MG PO TABS
1.2000 mg | ORAL_TABLET | Freq: Once | ORAL | Status: AC
  Administered 2016-11-01: 1.2 mg via ORAL
  Filled 2016-11-01: qty 2

## 2016-11-01 MED ORDER — COLCHICINE 0.6 MG PO TABS: 0.6000 mg | ORAL_TABLET | Freq: Every day | ORAL | 0 refills | Status: AC

## 2016-11-01 MED ORDER — POTASSIUM CHLORIDE CRYS ER 20 MEQ PO TBCR: 20.0000 meq | EXTENDED_RELEASE_TABLET | Freq: Every day | ORAL | 0 refills | Status: AC

## 2016-11-01 MED ORDER — AMOXICILLIN-POT CLAVULANATE 875-125 MG PO TABS: 1.0000 | ORAL_TABLET | Freq: Two times a day (BID) | ORAL | 0 refills | Status: AC

## 2016-11-01 MED ORDER — METOLAZONE 2.5 MG PO TABS: 2.5000 mg | ORAL_TABLET | ORAL | 0 refills | Status: AC

## 2016-11-01 MED ORDER — COLCHICINE 0.6 MG PO TABS
0.6000 mg | ORAL_TABLET | Freq: Once | ORAL | Status: AC
  Administered 2016-11-01: 0.6 mg via ORAL
  Filled 2016-11-01: qty 1

## 2016-11-01 MED ORDER — INSULIN GLARGINE 100 UNITS/ML SOLOSTAR PEN: 20.0000 [IU] | PEN_INJECTOR | Freq: Every day | SUBCUTANEOUS | 0 refills | Status: AC

## 2016-11-01 NOTE — Discharge Summary (Addendum)
Physician Discharge Summary  Jeremiah Perkins GQQ:[id number] DOB: Apr 14, 1961 DOA: 10/25/2016  PCP: [name], DO  Admit date: 10/25/2016 Discharge date: 11/01/2016  Admitted From: Home Disposition:  Home  Recommendations for Outpatient Follow-up:  1. Follow up with PCP in 1 week 2. Please obtain BMP in one week   Home Health: HH PT  Equipment/Devices: None   Discharge Condition: Stable CODE STATUS: Full  Diet recommendation: Heart healthy   Brief/Interim Summary: From H&P: Jeremiah Perkins is a 55 yo M with CHFrEF, NICM, A fib, HTN, HLD, COPD, admitted to ICU on 12/2 with septic shock in the setting of UTI/Pyelonephrotis, requiring pressors. He initially complained of shortness of breath, one day of fever, dysuria, flank pain. He was found to have pyelonephritis and treated with Rocephin. Due to septic shock, he required pressors as well as IV fluids for a short period of time. He continued to improve and was transferred to medical floor on 12/4 and TRH took over.He subsequently developed volume overload and required IV diuresis. His antibiotics were switched to Augmentin.   Subjective on day of discharge: He is doing well this morning. Complains of gout flare in right knee. He takes colchicine and allopurinol at baseline. Otherwise, he is off nasal cannula O2, breathing is much better. Denies any chest pain, nausea or vomiting. Tolerating meals.  Discharge Diagnoses:  Principal Problem:   Sepsis ([place]) Active Problems:   Chronic systolic heart failure (HCC)   Diabetes mellitus type 2, controlled, with complications ([place])   Morbid obesity ([place])   Long term current use of anticoagulant therapy   Chronic kidney disease (CKD), stage III (moderate)   Essential hypertension   Pyelonephritis   Septic shock due to E Coli UTI POA  - Shock physiology resolved, he is off pressors with good BP - Blood cultures negative  - He was initially placed on IV ceftriaxone, his urine cultures  showed Escherichia coli pansensitive, narrow antibiotics to Augmentin will need to 14 day course (last day 12/15)   Acute on chronic systolic heart failure, NICM - Patient fluid resuscitated in the setting of shock, now since shock resolved will attempt diuresis - Most recent 2D echo Jan 2017 EF 35-40%. Prior cath without significant obstructive disease per Dr. Acie Fredrickson notes - Weight 271 on admission >>282 >> 278 >> 282 >> 280 >> 277 lb at discharge  - ContinueCoreg  - Ted hose  - I/Os  - Daily weights  - Continue lasix   Acute hypoxemic respiratory failure - Due to above - Now off [place] O2   AKI on CKD III - Baseline Cr 1.2 - Worsened in setting of sepsis, lasix, now Cr back to baseline   - Monitor   Atrial fibrillation with intermittent RVR - Patient's CHA2DS2-VASc Score > 2, continue xarelto - Continue coreg   DM type 2  - A1C 8.5 this admission, poorly controlled - Lantus 14u daily, continue SSI, will increase lantus for improved glycemic control   HTN - Monitor on Coreg and Lasix  - Stable   Urinary retention - S/p Foley on admission, voiding trial, removed Foley catheter 12/4 - Bladder scan prn if unable to void    Right knee pain secondary to gout - Effusion noted on exam. Xray ordered. Ultram and Voltaren for pain.  - Colchicine and allopurinol   Discharge Instructions  Discharge Instructions    AMB Referral to [facility]    Complete by:  As directed    Reason for consult:  Post Dc follow up - restart of services - Health Team Adv member   Diagnoses of:  Heart Failure   Expected date of contact:  1-3 days (reserved for hospital discharges)   Restart of services:  Please assign to community nurse for transition of care calls and assess for home visits. Questions please call:   Natividad Brood, RN BSN [facility] Liaison  [phone] business mobile phone Toll free office [phone]   Diet - low sodium heart healthy     Complete by:  As directed    Face-to-face encounter (required for Medicare/Medicaid patients)    Complete by:  As directed    I Dessa Phi certify that this patient is under my care and that I, or a nurse practitioner or physician's assistant working with me, had a face-to-face encounter that meets the physician face-to-face encounter requirements with this patient on 11/01/2016. The encounter with the patient was in whole, or in part for the following medical condition(s) which is the primary reason for home health care (List medical condition): acute heart failure, respiratory failure   The encounter with the patient was in whole, or in part, for the following medical condition, which is the primary reason for home health care:  heart failure   I certify that, based on my findings, the following services are medically necessary home health services:  Physical therapy   Reason for Medically Necessary Home Health Services:  Therapy- Home Adaptation to Facilitate Safety   My clinical findings support the need for the above services:  Shortness of breath with activity   Further, I certify that my clinical findings support that this patient is homebound due to:  Shortness of Breath with activity   Home Health    Complete by:  As directed    To provide the following care/treatments:  PT   Increase activity slowly    Complete by:  As directed        Medication List    TAKE these medications   ACCU-CHEK AVIVA PLUS w/Device Kit Use glucose meter to check blood sugars 4 times daily.   allopurinol 300 MG tablet Commonly known as:  ZYLOPRIM Take 300 mg by mouth daily.   amoxicillin-clavulanate 875-125 MG tablet Commonly known as:  AUGMENTIN Take 1 tablet by mouth every 12 (twelve) hours.   budesonide 0.5 MG/2ML nebulizer solution Commonly known as:  PULMICORT Take 4 mLs (1 mg total) by nebulization 2 (two) times daily. What changed:  how much to take  when to take this  reasons to  take this   buPROPion 150 MG 12 hr tablet Commonly known as:  WELLBUTRIN SR Take 1 tablet (150 mg total) by mouth 2 (two) times daily.   carvedilol 12.5 MG tablet Commonly known as:  COREG Take 1 tablet (12.5 mg total) by mouth 2 (two) times daily with a meal.   colchicine 0.6 MG tablet Take 1 tablet (0.6 mg total) by mouth daily. What changed:  how much to take  how to take this  when to take this  additional instructions   diclofenac sodium 1 % Gel Commonly known as:  VOLTAREN Apply 2 g topically 4 (four) times daily. What changed:  when to take this  reasons to take this   fluticasone 50 MCG/ACT nasal spray Commonly known as:  FLONASE Use one spray in each nostril daily for seasonal allergy What changed:  how much to take  how to take this  when to take this  reasons to take this  additional instructions   furosemide 80 MG tablet Commonly known as:  LASIX Take 1 tablet (80 mg total) by mouth daily.   gabapentin 600 MG tablet Commonly known as:  NEURONTIN TAKE ONE TABLET BY MOUTH EVERY MORNING AND TAKE TWO TABLETS BY MOUTH EVERY EVENING   glucose blood test strip Commonly known as:  ACCU-CHEK AVIVA PLUS Use to check blood sugar four times daily. Dx: E11.8   insulin glargine 100 unit/mL Sopn Commonly known as:  LANTUS Inject 0.2 mLs (20 Units total) into the skin at bedtime. What changed:  how much to take  when to take this   Insulin Pen Needle 31G X 8 MM Misc Use daily with the administration of lantus E11.8   ipratropium 0.02 % nebulizer solution Commonly known as:  ATROVENT Take 2.5 mLs (0.5 mg total) by nebulization 2 (two) times daily. What changed:  when to take this  reasons to take this   levalbuterol 0.63 MG/3ML nebulizer solution Commonly known as:  XOPENEX Take 3 mLs (0.63 mg total) by nebulization every 4 (four) hours as needed for wheezing. What changed:  when to take this  reasons to take this   levalbuterol 45  MCG/ACT inhaler Commonly known as:  XOPENEX HFA Inhale 1-2 puffs into the lungs every 4 (four) hours as needed for wheezing. Reported on 12/13/2015   losartan 25 MG tablet Commonly known as:  COZAAR Take 0.5 tablets (12.5 mg total) by mouth daily. Reported on 12/13/2015   metolazone 2.5 MG tablet Commonly known as:  ZAROXOLYN Take 1 tablet (2.5 mg total) by mouth See admin instructions. Take 1 tablet (2.5 mg) by mouth up to twice a week as needed for weight gain of 7 lbs in 24 hours What changed:  when to take this  additional instructions   oxyCODONE 5 MG immediate release tablet Commonly known as:  Oxy IR/ROXICODONE Take 1-2 tablets (5-10 mg total) by mouth every 4 (four) hours as needed for moderate pain, severe pain or breakthrough pain.   potassium chloride SA 20 MEQ tablet Commonly known as:  K-DUR,KLOR-CON Take 1 tablet (20 mEq total) by mouth daily. What changed:  when to take this   [place] into the lungs See admin instructions. Use CPAP whenever sleeping   sildenafil 100 MG tablet Commonly known as:  VIAGRA Take 1 tablet (100 mg total) by mouth daily as needed for erectile dysfunction. Reported on 12/13/2015   XARELTO 20 MG Tabs tablet Generic drug:  rivaroxaban TAKE 1 TABLET (20 MG TOTAL) BY MOUTH DAILY WITH SUPPER. What changed:  See the new instructions.      Follow-up Information    [name], DO. Schedule an appointment as soon as possible for a visit in 1 week(s).   Specialty:  Internal Medicine Contact information: [place] [address] [phone]          No Known Allergies  Consultations:  PCCM   Procedures/Studies: Ct Abdomen Pelvis Wo Contrast  Result Date: 10/26/2016 CLINICAL DATA:  Pyelonephritis in is ICU patient. EXAM: CT ABDOMEN AND PELVIS WITHOUT CONTRAST TECHNIQUE: Multidetector CT imaging of the abdomen and pelvis was performed following the standard protocol without IV contrast.  COMPARISON:  February 05, 2015 FINDINGS: Lower chest: Scattered air trapping in the lung bases. No focal infiltrate or overt edema. Hepatobiliary: The patient is status post cholecystectomy. No focal abnormalities seen within the liver. There is increased attenuation in the fat of the porta hepatis  such as on series 2, image 33. While the pancreatic head is in close proximity, the increased attenuation in the fat does not appear to arise from the pancreas. The adjacent duodenum is not definitively thick walled. There is no free air is suggest ulceration. Several prominent nodes remain in the porta hepatis, similar to slightly more prominent in the interval. Pancreas: No focal mass seen in the pancreas. There is fatty deposition within the pancreatic head. While there is stranding in the porta hepatis, this is not appear to arise from the pancreas. Spleen: Normal in size without focal abnormality. Adrenals/Urinary Tract: The adrenal glands are normal. No renal stones or obstruction. No perinephric stranding on the right. There is some increased attenuation adjacent to the left kidney, likely sequela of recent pyelonephritis. No ureterectasis or ureteral stone. The bladder is decompressed with a Foley catheter. There is significant fat stranding around the decompressed bladder. Stomach/Bowel: The stomach and visualized small bowel are normal. Diverticulosis is seen in the colon. Mild stranding adjacent to the colon on series 2, image 56 near the junction of the descending and sigmoid colon suggests diverticulitis. Another region of mild stranding adjacent to the descending colon on image 47 also suggests diverticulitis. The remainder of the colon is unremarkable. The appendix is normal. Vascular/Lymphatic: Atherosclerosis in the iliac vessels. No aneurysm. No other adenopathy not described above. Reproductive: Prostate is unremarkable. Other: No other abnormalities. Musculoskeletal: No change in the bones. IMPRESSION: 1.  The bladder is decompressed with a Foley catheter. Stranding around the bladder is consistent with continued cystitis. Mild increased attenuation adjacent to the left kidney is likely due to sequela of recent pyelonephritis. 2. Two sites of mild diverticulitis in the descending and sigmoid colon. 3. Increased attenuation in the fat of the porta hepatis. The etiology is unclear. Based on location of the stranding, I do not favor pancreatitis. Recommend correlation with labs to exclude elevated lipase. The gallbladder has been removed. The adjacent duodenum is not clearly abnormal. The cause of this stranding cannot be clearly identified on this unenhanced study. Recommend clinical correlation. 4. Scattered air trapping in the lung bases. These results will be called to the ordering clinician or representative by the Radiologist Assistant, and communication documented in the PACS or zVision Dashboard. Electronically Signed   By: Dorise Bullion III M.D   On: 10/26/2016 16:08   Dg Chest 2 View  Result Date: 10/25/2016 CLINICAL DATA:  Shortness of breath. Cough. Chills. Nausea vomiting. EXAM: CHEST  2 VIEW COMPARISON:  Two-view chest x-ray a 08/20/2016 FINDINGS: The heart is enlarged. Mild edema is present. No focal airspace disease is evident. There are no effusions. The visualized soft tissues and bony thorax are unremarkable. IMPRESSION: 1. Cardiomegaly and mild edema compatible with congestive heart failure. 2. No focal airspace consolidation. Electronically Signed   By: San Morelle M.D.   On: 10/25/2016 16:38   US Renal  Result Date: 10/26/2016 CLINICAL DATA:  Pyelonephritis EXAM: RENAL / URINARY TRACT ULTRASOUND COMPLETE COMPARISON:  None. FINDINGS: Right Kidney: Length: 13.1 cm. Echogenicity within normal limits. No mass or hydronephrosis visualized. Left Kidney: Length: 12.7 cm. Echogenicity within normal limits. No mass or hydronephrosis visualized. Bladder: The bladder is decompressed with a  Foley catheter. IMPRESSION: 1. No acute abnormalities identified. The bladder is decompressed and not well assessed. Electronically Signed   By: Dorise Bullion III M.D   On: 10/26/2016 14:35   Dg Knee Complete 4 Views Right  Result Date: 10/31/2016 CLINICAL DATA:  Pain  and swelling. EXAM: RIGHT KNEE - COMPLETE 4+ VIEW COMPARISON:  MRI 02/11/2015 . FINDINGS: Small knee effusion cannot be excluded. Tricompartment degenerative change. Diffuse osteopenia. No acute bony abnormality identified. IMPRESSION: Small knee joint effusion cannot be excluded. Tricompartment degenerative change. Diffuse osteopenia. No acute bony abnormality . Electronically Signed   By: Marcello Moores  Register   On: 10/31/2016 09:30        Discharge Exam: Vitals:   10/31/16 1151 11/01/16 0513  BP: 124/86 (!) 115/57  Pulse: 86 89  Resp: 18 20  Temp: 97.9 F (36.6 C) 98.1 F (36.7 C)   Vitals:   10/30/16 2023 10/31/16 0557 10/31/16 1151 11/01/16 0513  BP: 111/72 (!) 123/91 124/86 (!) 115/57  Pulse: (!) 53 (!) 114 86 89  Resp: '20 18 18 20  ' Temp: 98 F (36.7 C) 97.9 F (36.6 C) 97.9 F (36.6 C) 98.1 F (36.7 C)  TempSrc: Oral Oral Oral Oral  SpO2: 100% 94% 98% 98%  Weight:  127.1 kg (280 lb 4.8 oz)  125.7 kg (277 lb 1.6 oz)  Height:        General: Pt is alert, awake, not in acute distress, sitting in chair  Cardiovascular: RRR, S1/S2 +, no rubs, no gallops Respiratory: CTA bilaterally, no wheezing, no rhonchi, Off  O2 Abdominal: Soft, NT, ND, bowel sounds + Extremities: +1 edema bilaterally, no cyanosis, right knee with joint effusion due to gout flare     The results of significant diagnostics from this hospitalization (including imaging, microbiology, ancillary and laboratory) are listed below for reference.     Microbiology: Recent Results (from the past 240 hour(s))  Urine culture     Status: Abnormal   Collection Time: 10/25/16  4:08 PM  Result Value Ref Range Status   Specimen Description URINE,  CLEAN CATCH  Final   Special Requests NONE  Final   Culture 20,000 COLONIES/mL ESCHERICHIA COLI (A)  Final   Report Status 10/27/2016 FINAL  Final   Organism ID, Bacteria ESCHERICHIA COLI (A)  Final      Susceptibility   Escherichia coli - MIC*    AMPICILLIN 4 SENSITIVE Sensitive     CEFAZOLIN <=4 SENSITIVE Sensitive     CEFTRIAXONE <=1 SENSITIVE Sensitive     CIPROFLOXACIN <=0.25 SENSITIVE Sensitive     GENTAMICIN <=1 SENSITIVE Sensitive     IMIPENEM <=0.25 SENSITIVE Sensitive     NITROFURANTOIN <=16 SENSITIVE Sensitive     TRIMETH/SULFA <=20 SENSITIVE Sensitive     AMPICILLIN/SULBACTAM <=2 SENSITIVE Sensitive     PIP/TAZO <=4 SENSITIVE Sensitive     Extended ESBL NEGATIVE Sensitive     * 20,000 COLONIES/mL ESCHERICHIA COLI  Culture, blood (Routine x 2)     Status: None   Collection Time: 10/25/16  4:09 PM  Result Value Ref Range Status   Specimen Description BLOOD RIGHT ANTECUBITAL  Final   Special Requests BOTTLES DRAWN AEROBIC AND ANAEROBIC 5CC  Final   Culture NO GROWTH 5 DAYS  Final   Report Status 10/30/2016 FINAL  Final  Culture, blood (Routine x 2)     Status: None   Collection Time: 10/25/16  4:40 PM  Result Value Ref Range Status   Specimen Description BLOOD RIGHT FOREARM  Final   Special Requests BOTTLES DRAWN AEROBIC AND ANAEROBIC 4CC  Final   Culture NO GROWTH 5 DAYS  Final   Report Status 10/30/2016 FINAL  Final  MRSA PCR Screening     Status: None   Collection Time: 10/26/16  1:39  AM  Result Value Ref Range Status   MRSA by PCR NEGATIVE NEGATIVE Final    Comment:        The GeneXpert MRSA Assay (FDA approved for NASAL specimens only), is one component of a comprehensive MRSA colonization surveillance program. It is not intended to diagnose MRSA infection nor to guide or monitor treatment for MRSA infections.      Labs: BNP (last 3 results)  Recent Labs  03/04/16 1047 08/20/16 0606 10/25/16 1638  BNP 127.8* 173.6* 102.7*   Basic Metabolic  Panel:  Recent Labs Lab 10/27/16 0258 10/28/16 0922 10/29/16 0445 10/30/16 0231 10/31/16 0222 11/01/16 0413  NA 133* 134* 133* 135 137 136  K 3.6 4.6 4.4 3.7 3.6 3.3*  CL 102 103 99* 98* 98* 96*  CO2 20* 18* 19* '27 27 29  ' GLUCOSE 116* 160* 180* 168* 215* 203*  BUN 25* 42* 43* 38* 29* 24*  CREATININE 1.32* 2.03* 1.49* 1.17 0.93 1.00  CALCIUM 8.6* 8.7* 8.7* 8.6* 8.6* 8.3*  MG 1.9  --   --   --   --   --   PHOS 2.3*  --   --   --   --   --    Liver Function Tests:  Recent Labs Lab 10/25/16 1637  AST 27  ALT 20  ALKPHOS 80  BILITOT 2.1*  PROT 7.6  ALBUMIN 3.0*   No results for input(s): LIPASE, AMYLASE in the last 168 hours. No results for input(s): AMMONIA in the last 168 hours. CBC:  Recent Labs Lab 10/25/16 1637 10/26/16 0643 10/27/16 0258 10/28/16 0922 10/29/16 0445  WBC 17.3* 16.8* 13.2* 6.8 6.4  NEUTROABS 15.3*  --   --   --   --   HGB 14.6 12.7* 12.8* 12.4* 12.5*  HCT 43.8 39.4 38.1* 37.4* 38.2*  MCV 91.1 91.4 90.5 89.0 89.3  PLT 264 237 242 234 225   Cardiac Enzymes: No results for input(s): CKTOTAL, CKMB, CKMBINDEX, TROPONINI in the last 168 hours. BNP: Invalid input(s): POCBNP CBG:  Recent Labs Lab 10/31/16 1149 10/31/16 1454 10/31/16 1821 10/31/16 2105 11/01/16 0600  GLUCAP 300* 222* 134* 176* 158*   D-Dimer No results for input(s): DDIMER in the last 72 hours. Hgb A1c No results for input(s): HGBA1C in the last 72 hours. Lipid Profile No results for input(s): CHOL, HDL, LDLCALC, TRIG, CHOLHDL, LDLDIRECT in the last 72 hours. Thyroid function studies No results for input(s): TSH, T4TOTAL, T3FREE, THYROIDAB in the last 72 hours.  Invalid input(s): FREET3 Anemia work up No results for input(s): VITAMINB12, FOLATE, FERRITIN, TIBC, IRON, RETICCTPCT in the last 72 hours. Urinalysis    Component Value Date/Time   COLORURINE ORANGE (A) 10/25/2016 1608   APPEARANCEUR CLOUDY (A) 10/25/2016 1608   LABSPEC 1.021 10/25/2016 1608   PHURINE  5.0 10/25/2016 1608   GLUCOSEU NEGATIVE 10/25/2016 1608   HGBUR SMALL (A) 10/25/2016 1608   BILIRUBINUR SMALL (A) 10/25/2016 1608   KETONESUR 15 (A) 10/25/2016 1608   PROTEINUR 100 (A) 10/25/2016 1608   UROBILINOGEN 0.2 07/16/2015 1845   NITRITE POSITIVE (A) 10/25/2016 1608   LEUKOCYTESUR MODERATE (A) 10/25/2016 1608   Sepsis Labs Invalid input(s): PROCALCITONIN,  WBC,  LACTICIDVEN Microbiology Recent Results (from the past 240 hour(s))  Urine culture     Status: Abnormal   Collection Time: 10/25/16  4:08 PM  Result Value Ref Range Status   Specimen Description URINE, CLEAN CATCH  Final   Special Requests NONE  Final   Culture 20,000  COLONIES/mL ESCHERICHIA COLI (A)  Final   Report Status 10/27/2016 FINAL  Final   Organism ID, Bacteria ESCHERICHIA COLI (A)  Final      Susceptibility   Escherichia coli - MIC*    AMPICILLIN 4 SENSITIVE Sensitive     CEFAZOLIN <=4 SENSITIVE Sensitive     CEFTRIAXONE <=1 SENSITIVE Sensitive     CIPROFLOXACIN <=0.25 SENSITIVE Sensitive     GENTAMICIN <=1 SENSITIVE Sensitive     IMIPENEM <=0.25 SENSITIVE Sensitive     NITROFURANTOIN <=16 SENSITIVE Sensitive     TRIMETH/SULFA <=20 SENSITIVE Sensitive     AMPICILLIN/SULBACTAM <=2 SENSITIVE Sensitive     PIP/TAZO <=4 SENSITIVE Sensitive     Extended ESBL NEGATIVE Sensitive     * 20,000 COLONIES/mL ESCHERICHIA COLI  Culture, blood (Routine x 2)     Status: None   Collection Time: 10/25/16  4:09 PM  Result Value Ref Range Status   Specimen Description BLOOD RIGHT ANTECUBITAL  Final   Special Requests BOTTLES DRAWN AEROBIC AND ANAEROBIC 5CC  Final   Culture NO GROWTH 5 DAYS  Final   Report Status 10/30/2016 FINAL  Final  Culture, blood (Routine x 2)     Status: None   Collection Time: 10/25/16  4:40 PM  Result Value Ref Range Status   Specimen Description BLOOD RIGHT FOREARM  Final   Special Requests BOTTLES DRAWN AEROBIC AND ANAEROBIC 4CC  Final   Culture NO GROWTH 5 DAYS  Final   Report Status  10/30/2016 FINAL  Final  MRSA PCR Screening     Status: None   Collection Time: 10/26/16  1:39 AM  Result Value Ref Range Status   MRSA by PCR NEGATIVE NEGATIVE Final    Comment:        The GeneXpert MRSA Assay (FDA approved for NASAL specimens only), is one component of a comprehensive MRSA colonization surveillance program. It is not intended to diagnose MRSA infection nor to guide or monitor treatment for MRSA infections.      Time coordinating discharge: Over 30 minutes  SIGNED:  Dessa Phi, DO [facility] Pager [phone]  If 7PM-7AM, please contact night-coverage [email or web address] Password [email or web address] 11/01/2016, 11:36 AM

## 2016-11-01 NOTE — Plan of Care (Signed)
Problem: Health Behavior/Discharge Planning: Goal: Ability to manage health-related needs will improve Outcome: Completed/Met Date Met: 11/01/16 Followed by [facility]   Problem: Activity: Goal: Risk for activity intolerance will decrease Outcome: Adequate for Discharge Still SOB with exertion, oxygen saturations in high 90's with ambulation, home PT ordered to assist with techniques

## 2016-11-01 NOTE — Progress Notes (Signed)
Went over discharge instructions with patient and wife, questions answered, verbalized understanding.  Patient still stating he needs oxygen, informed patient and his wife that his oxygen level resting is 99-100% on room air and with exertion the lowest reading was 92% on room air therefore does not qualify for home O2.  Home Health Pt has been ordered by physician however patient and wife not willing to wait to speak with case management.  Informed patient that he needs to answer his phone when case manager calls him so this can be set up for him.  Patient agreed.  Patient transported to front of hospital to be taken home by wife.

## 2016-11-03 ENCOUNTER — Telehealth: Payer: Self-pay | Admitting: *Deleted

## 2016-11-03 ENCOUNTER — Other Ambulatory Visit: Payer: Self-pay

## 2016-11-03 NOTE — Telephone Encounter (Signed)
TOC-Transition of Care  Patient was admitted on: 10/25/2016 Patient was discharged on: 11/01/2016 Patient was admitted due to: Septic Shock in setting of UTI/Pyelonephrotis.   1. Patient has medication and knows how to take them: Yes no concerns  2. Education on use and side effects of any new medication: Yes patient had no concerns. Stated he has an appointment with Cardiologist on Friday the 15th and an appointment with [place] on Wednesday the 13th.   3. Barriers to taking Medication: None  4. Patient reminded to call us if any needs arise: Yes Patient stated that he will call  5. Follow up Visit Scheduled: Yes for 11/05/16

## 2016-11-03 NOTE — Patient Outreach (Signed)
[facility]) Care Management  11/03/2016   Jeremiah Perkins Sep 28, 1961 [id number]  Subjective:  I have not been to pick up another glucometer from [name] I got out the hospital on the 9th, I have not weighed since then  Objective:  Telephonic encounter. Patient preceived as being noncompliant related ot heart failure sugject matter Patient has not weighed since discharge from acute care  Current Medications:  Current Outpatient Prescriptions  Medication Sig Dispense Refill  . allopurinol (ZYLOPRIM) 300 MG tablet Take 300 mg by mouth daily.    Marland Kitchen amoxicillin-clavulanate (AUGMENTIN) 875-125 MG tablet Take 1 tablet by mouth every 12 (twelve) hours. 12 tablet 0  . Blood Glucose Monitoring Suppl (ACCU-CHEK AVIVA PLUS) w/Device KIT Use glucose meter to check blood sugars 4 times daily. 1 kit 0  . budesonide (PULMICORT) 0.5 MG/2ML nebulizer solution Take 4 mLs (1 mg total) by nebulization 2 (two) times daily. (Patient taking differently: Take 0.5 mg by nebulization 2 (two) times daily as needed (shortness of breath/ wheezing). ) 60 mL 12  . buPROPion (WELLBUTRIN SR) 150 MG 12 hr tablet Take 1 tablet (150 mg total) by mouth 2 (two) times daily. 60 tablet 1  . carvedilol (COREG) 12.5 MG tablet Take 1 tablet (12.5 mg total) by mouth 2 (two) times daily with a meal. 180 tablet 3  . colchicine 0.6 MG tablet Take 1 tablet (0.6 mg total) by mouth daily. 30 tablet 0  . diclofenac sodium (VOLTAREN) 1 % GEL Apply 2 g topically 4 (four) times daily. (Patient taking differently: Apply 2 g topically 4 (four) times daily as needed (pain). ) 100 g 4  . fluticasone (FLONASE) 50 MCG/ACT nasal spray Use one spray in each nostril daily for seasonal allergy (Patient taking differently: Place 1 spray into both nostrils daily as needed (seasonal allergies). ) 16 g 3  . furosemide (LASIX) 80 MG tablet Take 1 tablet (80 mg total) by mouth daily. 30 tablet 0  . gabapentin (NEURONTIN) 600 MG  tablet TAKE ONE TABLET BY MOUTH EVERY MORNING AND TAKE TWO TABLETS BY MOUTH EVERY EVENING 90 tablet 0  . glucose blood (ACCU-CHEK AVIVA PLUS) test strip Use to check blood sugar four times daily. Dx: E11.8 100 each 11  . insulin glargine (LANTUS) 100 unit/mL SOPN Inject 0.2 mLs (20 Units total) into the skin at bedtime. 6 mL 0  . Insulin Pen Needle 31G X 8 MM MISC Use daily with the administration of lantus E11.8 100 each 1  . ipratropium (ATROVENT) 0.02 % nebulizer solution Take 2.5 mLs (0.5 mg total) by nebulization 2 (two) times daily. (Patient taking differently: Take 0.5 mg by nebulization 2 (two) times daily as needed for wheezing or shortness of breath. ) 75 mL 12  . levalbuterol (XOPENEX HFA) 45 MCG/ACT inhaler Inhale 1-2 puffs into the lungs every 4 (four) hours as needed for wheezing. Reported on 12/13/2015 1 Inhaler 2  . levalbuterol (XOPENEX) 0.63 MG/3ML nebulizer solution Take 3 mLs (0.63 mg total) by nebulization every 4 (four) hours as needed for wheezing. (Patient taking differently: Take 0.63 mg by nebulization 2 (two) times daily as needed for wheezing or shortness of breath. ) 60 mL 12  . losartan (COZAAR) 25 MG tablet Take 0.5 tablets (12.5 mg total) by mouth daily. Reported on 12/13/2015 45 tablet 2  . metolazone (ZAROXOLYN) 2.5 MG tablet Take 1 tablet (2.5 mg total) by mouth See admin instructions. Take 1 tablet (2.5 mg) by mouth up to twice a  week as needed for weight gain of 7 lbs in 24 hours 15 tablet 0  . oxyCODONE (OXY IR/ROXICODONE) 5 MG immediate release tablet Take 1-2 tablets (5-10 mg total) by mouth every 4 (four) hours as needed for moderate pain, severe pain or breakthrough pain. (Patient not taking: Reported on 10/25/2016) 100 tablet 0  . potassium chloride SA (K-DUR,KLOR-CON) 20 MEQ tablet Take 1 tablet (20 mEq total) by mouth daily. 30 tablet 0  . PRESCRIPTION MEDICATION Inhale into the lungs See admin instructions. Use CPAP whenever sleeping    . sildenafil (VIAGRA) 100  MG tablet Take 1 tablet (100 mg total) by mouth daily as needed for erectile dysfunction. Reported on 12/13/2015 10 tablet 0  . XARELTO 20 MG TABS tablet TAKE 1 TABLET (20 MG TOTAL) BY MOUTH DAILY WITH SUPPER. (Patient taking differently: TAKE 1 TABLET (20 MG TOTAL) BY MOUTH DAILY IN THE MORNING) 30 tablet 11   No current facility-administered medications for this visit.     Functional Status:  In your present state of health, do you have any difficulty performing the following activities: 11/03/2016 10/26/2016  Hearing? Y N  Vision? Y N  Difficulty concentrating or making decisions? Y N  Walking or climbing stairs? Y N  Dressing or bathing? N N  Doing errands, shopping? Y N  Preparing Food and eating ? N -  Using the Toilet? N -  In the past six months, have you accidently leaked urine? N -  Do you have problems with loss of bowel control? N -  Managing your Medications? N -  Managing your Finances? N -  Housekeeping or managing your Housekeeping? Y -  Some recent data might be hidden    Fall/Depression Screening: PHQ 2/9 Scores 11/03/2016 05/30/2016 04/04/2016 03/04/2016 03/04/2016 01/04/2016 08/03/2015  PHQ - 2 Score 0 0 0 1 0 0 0  PHQ- 9 Score - - - - - - -   [facility] CM Care Plan Problem One   Flowsheet Row Most Recent Value  Care Plan Problem One  patiient had a recent acute care admission  Role Documenting the Problem One  Care Management [place] for Problem One  Active  [facility] Long Term Goal (31-90 days)  In the next 31 days, patient will have no acute care admissions for heart failure  [facility] Long Term Goal Start Date  11/03/16  Interventions for Problem One Long Term Goal  transiiton of care call made ot assess barriers   [facility] CM Short Term Goal #1 (0-30 days)  In the next 15 days, patient will meet with [facility] RNCM for heart failure educaiton, assessment of community care coordination needs  [facility] CM Short Term Goal #1 Start Date  11/03/16  Interventions for Short Term Goal #1   Iniitial assessment of community care coordination needs completed during the transition of care call  [facility] CM Short Term Goal #2 (0-30 days)  In the next 21 days, patient would have weighed himself 14 out of 21 times  [facility] CM Short Term Goal #2 Start Date  11/03/16  Interventions for Short Term Goal #2  During this transition of care call, patient disclosed he had not weighed since discharge.  RNCM explained to patient the importance of daily weights at same time of day, after urinating, first thing upon weighing     Fall Risk  11/03/2016 09/30/2016 09/10/2016 08/27/2016 05/30/2016  Falls in the past year? _0   Number falls in past yr: 2  or more 1 2 or more 2 or more 1  Injury with Fall? No No No No Yes  Risk Factor Category  High Fall Risk - - - -  Risk for fall due to : History of fall(s);Impaired balance/gait;Impaired mobility;Impaired vision;Medication side effect - - - -  Risk for fall due to (comments): - - - - -  Follow up Follow up appointment - - - -   Assessment:   Patient recently discharged from the acute care settign with diagnosis of sepsis, has history of heart failure. Patient assisted in creation of his case management care plan. Patient reports having all his medications, need to pick up glucometer from pharmacy.  Plan:  Home visit later this month

## 2016-11-05 ENCOUNTER — Ambulatory Visit (INDEPENDENT_AMBULATORY_CARE_PROVIDER_SITE_OTHER): Payer: PPO | Admitting: Internal Medicine

## 2016-11-05 ENCOUNTER — Encounter: Payer: Self-pay | Admitting: Internal Medicine

## 2016-11-05 VITALS — BP 112/64 | HR 60 | Temp 97.5°F | Ht 67.0 in | Wt 266.2 lb

## 2016-11-05 DIAGNOSIS — J4530 Mild persistent asthma, uncomplicated: Secondary | ICD-10-CM

## 2016-11-05 DIAGNOSIS — E1142 Type 2 diabetes mellitus with diabetic polyneuropathy: Secondary | ICD-10-CM

## 2016-11-05 DIAGNOSIS — I4892 Unspecified atrial flutter: Secondary | ICD-10-CM | POA: Diagnosis not present

## 2016-11-05 DIAGNOSIS — I5022 Chronic systolic (congestive) heart failure: Secondary | ICD-10-CM | POA: Diagnosis not present

## 2016-11-05 DIAGNOSIS — N12 Tubulo-interstitial nephritis, not specified as acute or chronic: Secondary | ICD-10-CM

## 2016-11-05 DIAGNOSIS — M25461 Effusion, right knee: Secondary | ICD-10-CM | POA: Diagnosis not present

## 2016-11-05 DIAGNOSIS — Z794 Long term (current) use of insulin: Secondary | ICD-10-CM

## 2016-11-05 DIAGNOSIS — M109 Gout, unspecified: Secondary | ICD-10-CM

## 2016-11-05 DIAGNOSIS — F3342 Major depressive disorder, recurrent, in full remission: Secondary | ICD-10-CM | POA: Diagnosis not present

## 2016-11-05 DIAGNOSIS — M25531 Pain in right wrist: Secondary | ICD-10-CM | POA: Diagnosis not present

## 2016-11-05 DIAGNOSIS — I10 Essential (primary) hypertension: Secondary | ICD-10-CM | POA: Diagnosis not present

## 2016-11-05 DIAGNOSIS — E876 Hypokalemia: Secondary | ICD-10-CM

## 2016-11-05 DIAGNOSIS — M25474 Effusion, right foot: Secondary | ICD-10-CM | POA: Diagnosis not present

## 2016-11-05 DIAGNOSIS — Z79899 Other long term (current) drug therapy: Secondary | ICD-10-CM | POA: Diagnosis not present

## 2016-11-05 DIAGNOSIS — E108 Type 1 diabetes mellitus with unspecified complications: Secondary | ICD-10-CM | POA: Diagnosis not present

## 2016-11-05 LAB — CBC WITH DIFFERENTIAL/PLATELET
Basophils Absolute: 0 cells/uL (ref 0–200)
Basophils Relative: 0 %
EOS PCT: 2 %
Eosinophils Absolute: 156 cells/uL (ref 15–500)
HEMATOCRIT: 41.4 % (ref 38.5–50.0)
HEMOGLOBIN: 13.6 g/dL (ref 13.2–17.1)
LYMPHS ABS: 1716 {cells}/uL (ref 850–3900)
Lymphocytes Relative: 22 %
MCH: 29.3 pg (ref 27.0–33.0)
MCHC: 32.9 g/dL (ref 32.0–36.0)
MCV: 89.2 fL (ref 80.0–100.0)
MONO ABS: 390 {cells}/uL (ref 200–950)
MPV: 9.2 fL (ref 7.5–12.5)
Monocytes Relative: 5 %
NEUTROS PCT: 71 %
Neutro Abs: 5538 cells/uL (ref 1500–7800)
Platelets: 487 10*3/uL — ABNORMAL HIGH (ref 140–400)
RBC: 4.64 MIL/uL (ref 4.20–5.80)
RDW: 16.6 % — ABNORMAL HIGH (ref 11.0–15.0)
WBC: 7.8 10*3/uL (ref 3.8–10.8)

## 2016-11-05 LAB — BASIC METABOLIC PANEL WITH GFR
BUN: 30 mg/dL — ABNORMAL HIGH (ref 7–25)
CHLORIDE: 99 mmol/L (ref 98–110)
CO2: 29 mmol/L (ref 20–31)
Calcium: 8.9 mg/dL (ref 8.6–10.3)
Creat: 1.31 mg/dL (ref 0.70–1.33)
GFR, EST AFRICAN AMERICAN: 69 mL/min (ref >=60)
GFR, EST NON AFRICAN AMERICAN: 60 mL/min (ref >=60)
Glucose, Bld: 122 mg/dL — ABNORMAL HIGH (ref 65–99)
POTASSIUM: 4 mmol/L (ref 3.5–5.3)
SODIUM: 139 mmol/L (ref 135–146)

## 2016-11-05 NOTE — Progress Notes (Signed)
Patient ID: Jeremiah Perkins, male   DOB: 03-Feb-1961, 55 y.o.   MRN: [id number]    Location:  [facility] Place of Service: OFFICE  Chief Complaint  Patient presents with  . Hospitalization Follow-up    HPI:  55 yo male seen today for hospital f/u 10/25/16-11/01/16 at [facility] for septic shock due to pyelonephritis/UTI. He req'd pressure support in ICU. he was tx with IV rocephin and IVF. Urine cx (+) Ecoli pansensitive. He became volume overloaded and req'd diuresis. abx changed to augmentin (STOP DATE 11/07/16). Weight 277lb at d/c. A1c 8.5%. Right knee xray showed small joint effusion and arthritic changes. He was tx with voltaren and ultram. Cr 1.00 at d/c. His current weight 266 lbs  Hypokalemia - takes KCl. K+ 3.3  Depression/anxiety - mood stable although he does get easily frustrated with his brother who has dementia. He is his primary caregiver. He takes wellbutrin SR  DM/neuropathy - BS fluctuating at home 193--113--248 the last few days. He now has a meter at home. No low BS reactions.  Metformin stopped due to renal insufficiency (Cr 1.48 --> 1.24). He is on lantus 20 units qHS but he states he took 40 units last night 11/04/16. gabapentin helps neuropathy. A1c 8.5%. He is on ARB. Urine microalbumin/Cr ratio 68  Arthritis/gout - pain in right knee worse today. He takes roxicodone and uses voltaren gel. He reports gout attack in right knee and not relieved by any type of pain medication. No longer on prednisone. Allopurinol for gout maintenance. Followed by rheumatology Dr Charlestine Night  HTN/CHF/aflutter - rate controlled on coreg. Takes losartan for BP control. Followed by cardio. He takes xeralto. WEIGHT 269 LBS (prev 274 lb). He is still smoking cigs but markedly reduced with bupropion. Edema controlled on lasix and 2 times per week zaroxolyn. Home weights usually 259-264 lb.  Depression/anxiety - mood stable although he does get easily frustrated with his brother who has dementia. He is his  primary caregiver  Hyperlipidemia - diet controlled. LDL 118  Asthma/OSA/COPD - stable on pulmicort/atroventxopenex nebs. He could not tolerate xopenex HFA  GERD - stable. He takes prn tums and zofran. He has frequent loose stools since his gallbladder surgery. He goes for BM about 45mn after meals. He has increased ibuprofen use due to running out of roxicodone    Past Medical History:  Diagnosis Date  . Anxiety   . Arthritis    involving knees, ankles, back, elbows (01/02/2015)  . Asthma   . Atrial flutter ([facility]   . Cholelithiasis   . Chronic back pain   . Chronic bronchitis ([facility]    "get it q yr" (01/02/2015)  . Congestive heart failure ([facility]    No echo data available. Myocardial perfusion 2005 with EF 61%, presumed diastolic HF   . COPD (chronic obstructive pulmonary disease) ([facility]   . Diabetic peripheral neuropathy ([facility]   . Diverticulosis    with history of diverticulitis in 10/2011  . GERD (gastroesophageal reflux disease)   . Gout   . Hyperlipidemia   . Hypertension   . Major depression   . Morbid obesity ([facility] [place]   . Myocardial infarction    "one doctor said I did back in 2014"  . OSA (obstructive sleep apnea) 1998   "wear my CPAP a couple times/month" (01/02/2015)  . Pneumonia 2013; 2014  . Psychosexual dysfunction with inhibited sexual excitement   . Shortness of breath    "all the time" (07/26/2013)  . Type II diabetes mellitus ([facility]  dx'd 1991   previously on insulin in 2000 for 3-4 years (but was stopped because adamantly did not want to be on insulin)    Past Surgical History:  Procedure Laterality Date  . 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 ANKLE RECONSTRUCTION Right 1990's   2/2 trauma sustained after fall  . CARDIAC CATHETERIZATION  1990's; 07/2013  . CARDIOVERSION N/A 07/28/2013   Procedure: CARDIOVERSION;  Surgeon: Larey Dresser, MD;  Location: [facility] ENDOSCOPY;  Service: Cardiovascular;  Laterality: N/A;  . CARDIOVERSION N/A 10/26/2013   Procedure:  CARDIOVERSION;  Surgeon: [name], MD;  Location: [place];  Service: Cardiovascular;  Laterality: N/A;  . CHOLECYSTECTOMY N/A 08/24/2015   Procedure: LAPAROSCOPIC CHOLECYSTECTOMY WITH INTRAOPERATIVE CHOLANGIOGRAM;  Surgeon: Jackolyn Confer, MD;  Location: [facility] ORS;  Service: General;  Laterality: N/A;  . CYST REMOVAL LEG Left 1960's   back of  leg  . FINGER AMPUTATION    . LEFT HEART CATHETERIZATION WITH CORONARY ANGIOGRAM N/A 07/27/2013   Procedure: LEFT HEART CATHETERIZATION WITH CORONARY ANGIOGRAM;  Surgeon: Peter M Martinique, MD;  Location: [facility] CATH LAB;  Service: Cardiovascular;  Laterality: N/A;  . TEE WITHOUT CARDIOVERSION N/A 07/28/2013   Procedure: TRANSESOPHAGEAL ECHOCARDIOGRAM (TEE);  Surgeon: Larey Dresser, MD;  Location: [facility] ENDOSCOPY;  Service: Cardiovascular;  Laterality: N/A;    Patient Care Team: [name], DO as PCP - General (Internal Medicine) [name], MD as Consulting Physician (Cardiology) Rutherford Guys, MD as Consulting Physician (Ophthalmology) Tobi Bastos, RN as [facility] [facility]  Social History   Social History  . Marital status: Married    Spouse name: N/A  . Number of children: 3  . Years of education: 14   Occupational History  . Now unemployed     Conservation officer, nature at [employer]  .  [employer]   Social History Main Topics  . Smoking status: Former Smoker    Packs/day: 0.12    Years: 10.00    Types: Cigarettes    Quit date: 09/13/2015  . Smokeless tobacco: Never Used     Comment: rarely.  . Alcohol use 0.0 oz/week     Comment: OCCASIONAL  . Drug use: No     Comment: LAST USED DEC 2015  . Sexual activity: Yes   Other Topics Concern  . Not on file   Social History Narrative   Previously worked as Conservation officer, nature and lives at home with his wife. They each have children but none between the two of them.      Diet:      Do you drink/ eat things with caffeine?  Yes      Marital status:   Married                              What year were you married ? 2010      Do you live in a house, apartment,assistred living, condo, trailer, etc.)? House      Is it one or more stories? Yes      How many persons live in your home ? 3      Do you have any pets in your home ?(please list) Dog      Current or past profession: Aircraft Mechanic/Instructor      Do you exercise?  Yes  Type & how often:  Golf,cut grass, walk      Do you have a living will? No      Do you have a DNR form?   No                    If not, do you want to discuss one?       Do you have signed POA?HPOA forms?   No              If so, please bring to your        appointment           reports that he quit smoking about 13 months ago. His smoking use included Cigarettes. He has a 1.20 pack-year smoking history. He has never used smokeless tobacco. He reports that he drinks alcohol. He reports that he does not use drugs.  Family History  Problem Relation Age of Onset  . Diabetes Mother   . CAD Mother 63    requiring quadruple bypass  . Hypertension Mother   . Alzheimer's disease Mother   . Stomach cancer Father   . Lung cancer Father     was a smoker  . Gout Father   . CAD Brother 22    requiring quadruple bypass  . Seizures Brother   . Hypertension Brother   . Diabetes Maternal Grandmother   . Alzheimer's disease Maternal Grandmother   . Breast cancer Paternal Aunt   . Diabetes Maternal Aunt   . Seizures Paternal Uncle    Family Status  Relation Status  . Mother Deceased  . Father Deceased  . Brother Alive  . Brother Deceased  . Brother Deceased  . Brother Deceased  . Brother Deceased  . Son Alive  . Daughter Alive  . Daughter Alive  . Brother   . Brother   . Brother   . Maternal Grandmother   . Paternal Aunt   . Maternal Aunt   . Paternal Uncle      No Known Allergies  Medications: Patient's Medications  New Prescriptions   No medications on file  Previous  Medications   ALLOPURINOL (ZYLOPRIM) 300 MG TABLET    Take 300 mg by mouth daily.   AMOXICILLIN-CLAVULANATE (AUGMENTIN) 875-125 MG TABLET    Take 1 tablet by mouth every 12 (twelve) hours.   BLOOD GLUCOSE MONITORING SUPPL (ACCU-CHEK AVIVA PLUS) W/DEVICE KIT    Use glucose meter to check blood sugars 4 times daily.   BUDESONIDE (PULMICORT) 0.5 MG/2ML NEBULIZER SOLUTION    Take 4 mLs (1 mg total) by nebulization 2 (two) times daily.   BUPROPION (WELLBUTRIN SR) 150 MG 12 HR TABLET    Take 1 tablet (150 mg total) by mouth 2 (two) times daily.   CARVEDILOL (COREG) 12.5 MG TABLET    Take 1 tablet (12.5 mg total) by mouth 2 (two) times daily with a meal.   COLCHICINE 0.6 MG TABLET    Take 1 tablet (0.6 mg total) by mouth daily.   DICLOFENAC SODIUM (VOLTAREN) 1 % GEL    Apply 2 g topically 4 (four) times daily.   FLUTICASONE (FLONASE) 50 MCG/ACT NASAL SPRAY    Use one spray in each nostril daily for seasonal allergy   FUROSEMIDE (LASIX) 80 MG TABLET    Take 1 tablet (80 mg total) by mouth daily.   GABAPENTIN (NEURONTIN) 600 MG TABLET    TAKE ONE TABLET BY MOUTH EVERY MORNING AND TAKE TWO TABLETS BY  MOUTH EVERY EVENING   GLUCOSE BLOOD (ACCU-CHEK AVIVA PLUS) TEST STRIP    Use to check blood sugar four times daily. Dx: E11.8   INSULIN GLARGINE (LANTUS) 100 UNIT/ML SOPN    Inject 0.2 mLs (20 Units total) into the skin at bedtime.   INSULIN PEN NEEDLE 31G X 8 MM MISC    Use daily with the administration of lantus E11.8   IPRATROPIUM (ATROVENT) 0.02 % NEBULIZER SOLUTION    Take 2.5 mLs (0.5 mg total) by nebulization 2 (two) times daily.   LEVALBUTEROL (XOPENEX HFA) 45 MCG/ACT INHALER    Inhale 1-2 puffs into the lungs every 4 (four) hours as needed for wheezing. Reported on 12/13/2015   LEVALBUTEROL (XOPENEX) 0.63 MG/3ML NEBULIZER SOLUTION    Take 3 mLs (0.63 mg total) by nebulization every 4 (four) hours as needed for wheezing.   LOSARTAN (COZAAR) 25 MG TABLET    Take 0.5 tablets (12.5 mg total) by mouth daily.  Reported on 12/13/2015   METOLAZONE (ZAROXOLYN) 2.5 MG TABLET    Take 1 tablet (2.5 mg total) by mouth See admin instructions. Take 1 tablet (2.5 mg) by mouth up to twice a week as needed for weight gain of 7 lbs in 24 hours   OXYCODONE (OXY IR/ROXICODONE) 5 MG IMMEDIATE RELEASE TABLET    Take 1-2 tablets (5-10 mg total) by mouth every 4 (four) hours as needed for moderate pain, severe pain or breakthrough pain.   POTASSIUM CHLORIDE SA (K-DUR,KLOR-CON) 20 MEQ TABLET    Take 1 tablet (20 mEq total) by mouth daily.   PRESCRIPTION MEDICATION    Inhale into the lungs See admin instructions. Use CPAP whenever sleeping   SILDENAFIL (VIAGRA) 100 MG TABLET    Take 1 tablet (100 mg total) by mouth daily as needed for erectile dysfunction. Reported on 12/13/2015   XARELTO 20 MG TABS TABLET    TAKE 1 TABLET (20 MG TOTAL) BY MOUTH DAILY WITH SUPPER.  Modified Medications   No medications on file  Discontinued Medications   No medications on file    Review of Systems  Musculoskeletal: Positive for arthralgias, gait problem and joint swelling.  Neurological: Positive for numbness.  Psychiatric/Behavioral: Positive for dysphoric mood.  All other systems reviewed and are negative.   Vitals:   11/05/16 0859  BP: 112/64  Pulse: 60  Temp: 97.5 F (36.4 C)  TempSrc: Oral  SpO2: 97%  Weight: 266 lb 3.2 oz (120.7 kg)  Height: _0  (1.702 m)   Body mass index is 41.69 kg/m.  Physical Exam  Constitutional: He is oriented to person, place, and time. He appears well-developed and well-nourished.  Looks uncomfortable in NAD. Lying on exam table on stomach, moaning  HENT:  Mouth/Throat: Oropharynx is clear and moist.  Eyes: Pupils are equal, round, and reactive to light. No scleral icterus.  Neck: Neck supple. Carotid bruit is not present.  Cardiovascular: Normal rate and intact distal pulses.  An irregularly irregular rhythm present. Exam reveals no gallop and no friction rub.   Murmur heard.   Systolic murmur is present with a grade of 1/6  +1 pitting LE edema b/l. No calf TTP  Pulmonary/Chest: Effort normal and breath sounds normal. No respiratory distress. He has no wheezes. He has no rales. He exhibits no tenderness.  Abdominal: Soft. Bowel sounds are normal. He exhibits no distension, no abdominal bruit, no pulsatile midline mass and no mass. There is no hepatomegaly. There is tenderness (RLQ). There is no rebound and no  guarding.  RLQ contusion   Musculoskeletal: He exhibits edema, tenderness (right knee increased warmth to touch with swelling medially) and deformity.  Left knee with reduced swelling and effusion anterior>posterior and improved flexion/extension; no palpable popliteal mass/cyst  Lymphadenopathy:    He has no cervical adenopathy.  Neurological: He is alert and oriented to person, place, and time.  Skin: Skin is warm and dry. No rash noted.  Left medial knee with hard palpable TTP cyst vs nodule. No streaking. Left thumb with very tiny laceration adjacent to nail but no nail involvement. No bleeding or d/c but TTP. No secondary signs of infection  Psychiatric: He has a normal mood and affect. His behavior is normal. Judgment and thought content normal.     Labs reviewed: Admission on 10/25/2016, Discharged on 11/01/2016  No results displayed because visit has over 200 results.    Office Visit on 10/23/2016  Component Date Value Ref Range Status  . Sodium 10/24/2016 136  135 - 146 mmol/L Final  . Potassium 10/24/2016 3.9  3.5 - 5.3 mmol/L Final  . Chloride 10/24/2016 97* 98 - 110 mmol/L Final  . CO2 10/24/2016 30  20 - 31 mmol/L Final  . Glucose, Bld 10/24/2016 219* 65 - 99 mg/dL Final  . BUN 10/24/2016 35* 7 - 25 mg/dL Final  . Creat 10/24/2016 1.16  0.70 - 1.33 mg/dL Final   Comment:   For patients > or = 58 years of age: The upper reference limit for Creatinine is approximately 13% higher for people identified as African-American.     . Calcium  10/24/2016 8.7  8.6 - 10.3 mg/dL Final  . GFR, Est African American 10/24/2016 80  >=60 mL/min Final  . GFR, Est Non African American 10/24/2016 70  >=60 mL/min Final  . Sodium, Ur 10/24/2016 79  28 - 272 mmol/L Final  . Osmolality, Ur 10/24/2016 309  50 - 1,200 mOsm/kg Final  . Creatinine, Urine 10/24/2016 34  20 - 370 mg/dL Final  . Microalb, Ur 10/24/2016 2.3  Not estab mg/dL Final  . Microalb Creat Ratio 10/24/2016 68* <30 mcg/mg creat Final   Comment: The ADA has defined abnormalities in albumin excretion as follows:           Category           Result                            (mcg/mg creatinine)                 Normal:    <30       Microalbuminuria:    30 - 299   Clinical albuminuria:    > or = 300   The ADA recommends that at least two of three specimens collected within a 3 - 6 month period be abnormal before considering a patient to be within a diagnostic category.     Office Visit on 09/30/2016  Component Date Value Ref Range Status  . TSH 10/15/2016 6.72* 0.40 - 4.50 mIU/L Final  . Sodium 10/15/2016 132* 135 - 146 mmol/L Final  . Potassium 10/15/2016 3.2* 3.5 - 5.3 mmol/L Final  . Chloride 10/15/2016 89* 98 - 110 mmol/L Final  . CO2 10/15/2016 28  20 - 31 mmol/L Final  . Glucose, Bld 10/15/2016 172* 65 - 99 mg/dL Final  . BUN 10/15/2016 59* 7 - 25 mg/dL Final  . Creat 10/15/2016 1.37* 0.70 - 1.33 mg/dL  Final   Comment:   For patients > or = 57 years of age: The upper reference limit for Creatinine is approximately 13% higher for people identified as African-American.     . Total Bilirubin 10/15/2016 0.9  0.2 - 1.2 mg/dL Final  . Alkaline Phosphatase 10/15/2016 107  40 - 115 U/L Final  . AST 10/15/2016 39* 10 - 35 U/L Final  . ALT 10/15/2016 22  9 - 46 U/L Final  . Total Protein 10/15/2016 7.2  6.1 - 8.1 g/dL Final  . Albumin 10/15/2016 3.8  3.6 - 5.1 g/dL Final  . Calcium 10/15/2016 9.3  8.6 - 10.3 mg/dL Final  . GFR, Est African American 10/15/2016 66  >=60  mL/min Final  . GFR, Est Non African American 10/15/2016 57* >=60 mL/min Final  . WBC 10/14/2016 7.6  3.8 - 10.8 K/uL Final  . RBC 10/14/2016 5.24  4.20 - 5.80 MIL/uL Final  . Hemoglobin 10/14/2016 15.9  13.2 - 17.1 g/dL Final  . HCT 10/14/2016 47.8  38.5 - 50.0 % Final  . MCV 10/14/2016 91.2  80.0 - 100.0 fL Final  . MCH 10/14/2016 30.3  27.0 - 33.0 pg Final  . MCHC 10/14/2016 33.3  32.0 - 36.0 g/dL Final  . RDW 10/14/2016 16.7* 11.0 - 15.0 % Final  . Platelets 10/14/2016 336  140 - 400 K/uL Final  . MPV 10/14/2016 9.9  7.5 - 12.5 fL Final  . Neutro Abs 10/14/2016 4180  1,500 - 7,800 cells/uL Final  . Lymphs Abs 10/14/2016 2508  850 - 3,900 cells/uL Final  . Monocytes Absolute 10/14/2016 532  200 - 950 cells/uL Final  . Eosinophils Absolute 10/14/2016 380  15 - 500 cells/uL Final  . Basophils Absolute 10/14/2016 0  0 - 200 cells/uL Final  . Neutrophils Relative % 10/14/2016 55  % Final  . Lymphocytes Relative 10/14/2016 33  % Final  . Monocytes Relative 10/14/2016 7  % Final  . Eosinophils Relative 10/14/2016 5  % Final  . Basophils Relative 10/14/2016 0  % Final  . Smear Review 10/14/2016 Criteria for review not met   Final  . RPR Ser Ql 10/15/2016 NON REAC  NON REAC Final  . Vitamin B-12 10/15/2016 487  200 - 1,100 pg/mL Final  . Hgb A1c MFr Bld 10/15/2016 8.2* <5.7 % Final   Comment:   For someone without known diabetes, a hemoglobin A1c value of 6.5% or greater indicates that they may have diabetes and this should be confirmed with a follow-up test.   For someone with known diabetes, a value <7% indicates that their diabetes is well controlled and a value greater than or equal to 7% indicates suboptimal control. A1c targets should be individualized based on duration of diabetes, age, comorbid conditions, and other considerations.   Currently, no consensus exists for use of hemoglobin A1c for diagnosis of diabetes for children.     . Mean Plasma Glucose 10/15/2016 189   mg/dL Final  Office Visit on 08/27/2016  Component Date Value Ref Range Status  . Sodium 08/27/2016 137  135 - 146 mmol/L Final  . Potassium 08/27/2016 3.4* 3.5 - 5.3 mmol/L Final  . Chloride 08/27/2016 92* 98 - 110 mmol/L Final  . CO2 08/27/2016 33* 20 - 31 mmol/L Final  . Glucose, Bld 08/27/2016 137* 65 - 99 mg/dL Final  . BUN 08/27/2016 27* 7 - 25 mg/dL Final  . Creat 08/27/2016 1.18  0.70 - 1.33 mg/dL Final   Comment:   For  patients > or = 57 years of age: The upper reference limit for Creatinine is approximately 13% higher for people identified as African-American.     . Total Bilirubin 08/27/2016 1.6* 0.2 - 1.2 mg/dL Final  . Alkaline Phosphatase 08/27/2016 71  40 - 115 U/L Final  . AST 08/27/2016 25  10 - 35 U/L Final  . ALT 08/27/2016 16  9 - 46 U/L Final  . Total Protein 08/27/2016 6.7  6.1 - 8.1 g/dL Final  . Albumin 08/27/2016 3.5* 3.6 - 5.1 g/dL Final  . Calcium 08/27/2016 8.9  8.6 - 10.3 mg/dL Final  . GFR, Est African American 08/27/2016 79  >=60 mL/min Final  . GFR, Est Non African American 08/27/2016 68  >=60 mL/min Final  . Uric Acid, Serum 08/27/2016 4.6  4.0 - 8.0 mg/dL Final  Admission on 08/20/2016, Discharged on 08/20/2016  Component Date Value Ref Range Status  . Sodium 08/20/2016 135  135 - 145 mmol/L Final  . Potassium 08/20/2016 2.7* 3.5 - 5.1 mmol/L Final   Comment: CRITICAL RESULT CALLED TO, READ BACK BY AND VERIFIED WITH: NEALSON,T. RN _0  ON 9.27.17 BY MCCOY,N.   . Chloride 08/20/2016 88* 101 - 111 mmol/L Final  . CO2 08/20/2016 34* 22 - 32 mmol/L Final  . Glucose, Bld 08/20/2016 183* 65 - 99 mg/dL Final  . BUN 08/20/2016 56* 6 - 20 mg/dL Final  . Creatinine, Ser 08/20/2016 1.68* 0.61 - 1.24 mg/dL Final  . Calcium 08/20/2016 9.1  8.9 - 10.3 mg/dL Final  . GFR calc non Af Amer 08/20/2016 44* >60 mL/min Final  . GFR calc Af Amer 08/20/2016 51* >60 mL/min Final   Comment: (NOTE) The eGFR has been calculated using the CKD EPI equation. This  calculation has not been validated in all clinical situations. eGFR's persistently <60 mL/min signify possible Chronic Kidney Disease.   . Anion gap 08/20/2016 13  5 - 15 Final  . Troponin I 08/20/2016 0.09* <0.03 ng/mL Final   Comment: CRITICAL RESULT CALLED TO, READ BACK BY AND VERIFIED WITH: NEALSON,T. RN _1  ON 9.27.17 BY MCCOY,N.    . B Natriuretic Peptide 08/20/2016 173.6* 0.0 - 100.0 pg/mL Final  . WBC 08/20/2016 6.7  4.0 - 10.5 K/uL Final  . RBC 08/20/2016 4.45  4.22 - 5.81 MIL/uL Final  . Hemoglobin 08/20/2016 13.6  13.0 - 17.0 g/dL Final  . HCT 08/20/2016 41.4  39.0 - 52.0 % Final  . MCV 08/20/2016 93.0  78.0 - 100.0 fL Final  . MCH 08/20/2016 30.6  26.0 - 34.0 pg Final  . MCHC 08/20/2016 32.9  30.0 - 36.0 g/dL Final  . RDW 08/20/2016 14.7  11.5 - 15.5 % Final  . Platelets 08/20/2016 273  150 - 400 K/uL Final  . Neutrophils Relative % 08/20/2016 62  % Final  . Neutro Abs 08/20/2016 4.2  1.7 - 7.7 K/uL Final  . Lymphocytes Relative 08/20/2016 27  % Final  . Lymphs Abs 08/20/2016 1.8  0.7 - 4.0 K/uL Final  . Monocytes Relative 08/20/2016 8  % Final  . Monocytes Absolute 08/20/2016 0.5  0.1 - 1.0 K/uL Final  . Eosinophils Relative 08/20/2016 3  % Final  . Eosinophils Absolute 08/20/2016 0.2  0.0 - 0.7 K/uL Final  . Basophils Relative 08/20/2016 0  % Final  . Basophils Absolute 08/20/2016 0.0  0.0 - 0.1 K/uL Final  . Troponin I 08/20/2016 0.08* <0.03 ng/mL Final    Ct Abdomen Pelvis Wo Contrast  Result Date: 10/26/2016 CLINICAL  DATA:  Pyelonephritis in is ICU patient. EXAM: CT ABDOMEN AND PELVIS WITHOUT CONTRAST TECHNIQUE: Multidetector CT imaging of the abdomen and pelvis was performed following the standard protocol without IV contrast. COMPARISON:  February 05, 2015 FINDINGS: Lower chest: Scattered air trapping in the lung bases. No focal infiltrate or overt edema. Hepatobiliary: The patient is status post cholecystectomy. No focal abnormalities seen within the liver. There  is increased attenuation in the fat of the porta hepatis such as on series 2, image 33. While the pancreatic head is in close proximity, the increased attenuation in the fat does not appear to arise from the pancreas. The adjacent duodenum is not definitively thick walled. There is no free air is suggest ulceration. Several prominent nodes remain in the porta hepatis, similar to slightly more prominent in the interval. Pancreas: No focal mass seen in the pancreas. There is fatty deposition within the pancreatic head. While there is stranding in the porta hepatis, this is not appear to arise from the pancreas. Spleen: Normal in size without focal abnormality. Adrenals/Urinary Tract: The adrenal glands are normal. No renal stones or obstruction. No perinephric stranding on the right. There is some increased attenuation adjacent to the left kidney, likely sequela of recent pyelonephritis. No ureterectasis or ureteral stone. The bladder is decompressed with a Foley catheter. There is significant fat stranding around the decompressed bladder. Stomach/Bowel: The stomach and visualized small bowel are normal. Diverticulosis is seen in the colon. Mild stranding adjacent to the colon on series 2, image 56 near the junction of the descending and sigmoid colon suggests diverticulitis. Another region of mild stranding adjacent to the descending colon on image 47 also suggests diverticulitis. The remainder of the colon is unremarkable. The appendix is normal. Vascular/Lymphatic: Atherosclerosis in the iliac vessels. No aneurysm. No other adenopathy not described above. Reproductive: Prostate is unremarkable. Other: No other abnormalities. Musculoskeletal: No change in the bones. IMPRESSION: 1. The bladder is decompressed with a Foley catheter. Stranding around the bladder is consistent with continued cystitis. Mild increased attenuation adjacent to the left kidney is likely due to sequela of recent pyelonephritis. 2. Two sites  of mild diverticulitis in the descending and sigmoid colon. 3. Increased attenuation in the fat of the porta hepatis. The etiology is unclear. Based on location of the stranding, I do not favor pancreatitis. Recommend correlation with labs to exclude elevated lipase. The gallbladder has been removed. The adjacent duodenum is not clearly abnormal. The cause of this stranding cannot be clearly identified on this unenhanced study. Recommend clinical correlation. 4. Scattered air trapping in the lung bases. These results will be called to the ordering clinician or representative by the Radiologist Assistant, and communication documented in the PACS or zVision Dashboard. Electronically Signed   By: Dorise Bullion III M.D   On: 10/26/2016 16:08   Dg Chest 2 View  Result Date: 10/25/2016 CLINICAL DATA:  Shortness of breath. Cough. Chills. Nausea vomiting. EXAM: CHEST  2 VIEW COMPARISON:  Two-view chest x-ray a 08/20/2016 FINDINGS: The heart is enlarged. Mild edema is present. No focal airspace disease is evident. There are no effusions. The visualized soft tissues and bony thorax are unremarkable. IMPRESSION: 1. Cardiomegaly and mild edema compatible with congestive heart failure. 2. No focal airspace consolidation. Electronically Signed   By: San Morelle M.D.   On: 10/25/2016 16:38   US Renal  Result Date: 10/26/2016 CLINICAL DATA:  Pyelonephritis EXAM: RENAL / URINARY TRACT ULTRASOUND COMPLETE COMPARISON:  None. FINDINGS: Right Kidney: Length: 13.1  cm. Echogenicity within normal limits. No mass or hydronephrosis visualized. Left Kidney: Length: 12.7 cm. Echogenicity within normal limits. No mass or hydronephrosis visualized. Bladder: The bladder is decompressed with a Foley catheter. IMPRESSION: 1. No acute abnormalities identified. The bladder is decompressed and not well assessed. Electronically Signed   By: Dorise Bullion III M.D   On: 10/26/2016 14:35   Dg Knee Complete 4 Views Right  Result  Date: 10/31/2016 CLINICAL DATA:  Pain and swelling. EXAM: RIGHT KNEE - COMPLETE 4+ VIEW COMPARISON:  MRI 02/11/2015 . FINDINGS: Small knee effusion cannot be excluded. Tricompartment degenerative change. Diffuse osteopenia. No acute bony abnormality identified. IMPRESSION: Small knee joint effusion cannot be excluded. Tricompartment degenerative change. Diffuse osteopenia. No acute bony abnormality . Electronically Signed   By: Marcello Moores  Register   On: 10/31/2016 09:30     Assessment/Plan   ICD-9-CM ICD-10-CM   1. Acute gout of right knee, unspecified cause 274.01 M10.9   2. Pyelonephritis 590.80 N12 CBC with Differential/Platelets  3. Type 2 diabetes mellitus with diabetic polyneuropathy, with long-term current use of insulin (HCC) 250.60 E11.42 BMP with eGFR   357.2 Z79.4 CBC with Differential/Platelets   V58.67    4. Chronic systolic heart failure (HCC) 428.22 I50.22   5. Recurrent major depressive disorder, in full remission ([place]) 296.36 F33.42   6. Atrial flutter, unspecified type ([place]) 427.32 I48.92   7. Essential hypertension 401.9 I10   8. Asthma, chronic, mild persistent, uncomplicated 832.91 B16.60   9. Hypokalemia 276.8 E87.6 BMP with eGFR   Will call with lab results  F/u with rheumatology for knee pain  Finish augmentin   Continue other medications as ordered  Follow up with cardiology as scheduled  Keep appt with me as scheduled    Tobenna Needs S. Perlie Gold  [facility] [address] [place], [place] [address] [phone] Cell (Monday-Friday 8 AM - 5 PM) [phone] After 5 PM and follow prompts

## 2016-11-05 NOTE — Patient Instructions (Addendum)
Will call with lab results  CALL DR TRUSLOW FOR APPT FOR RIGHT KNEE GOUT ATTACK  Finish augmentin   Continue other medications as ordered  Follow up with cardiology as scheduled  Keep appt with me as scheduled

## 2016-11-06 ENCOUNTER — Telehealth: Payer: Self-pay | Admitting: *Deleted

## 2016-11-06 NOTE — Telephone Encounter (Signed)
Called and spoke with Denny Peon at [employer] and she will be faxing a change of Provider form so we can order this through our office. To be filled out and faxed back.

## 2016-11-06 NOTE — Telephone Encounter (Signed)
Received Fax form. Filled out and placed in folder for Dr. Montez Morita to review and sign. To be faxed back to [phone]

## 2016-11-06 NOTE — Telephone Encounter (Signed)
Patient walked into office with a letter from [employer] Patient Assistance Program for Viagra 100mg . His enrollment is good through 12/27/2016 and his Cardiologist Dr. Cassell Clement has been prescribing. Patient stated that he is retiring and his new Cardiologist told him that the refills would have to go through Primary. Is this ok to refill through you? Please Advise.   Patient ID: [id number] [employer]: [phone] Sent letter for scanning

## 2016-11-06 NOTE — Telephone Encounter (Signed)
Ok to RF med 

## 2016-11-07 ENCOUNTER — Encounter: Payer: Self-pay | Admitting: Cardiovascular Disease

## 2016-11-07 ENCOUNTER — Ambulatory Visit (INDEPENDENT_AMBULATORY_CARE_PROVIDER_SITE_OTHER): Payer: PPO | Admitting: Cardiovascular Disease

## 2016-11-07 VITALS — BP 100/70 | HR 80 | Ht 67.0 in | Wt 271.8 lb

## 2016-11-07 DIAGNOSIS — I5022 Chronic systolic (congestive) heart failure: Secondary | ICD-10-CM

## 2016-11-07 DIAGNOSIS — I10 Essential (primary) hypertension: Secondary | ICD-10-CM

## 2016-11-07 NOTE — Progress Notes (Signed)
Cardiology Office Note   Date:  11/07/2016   ID:  Jeremiah Perkins, DOB 1961-06-16, MRN [id number]  PCP:  Jeremiah Cranker, DO  Cardiologist:  Transfer from Jeremiah Coco MD, now Jeremiah Perkins  Problem list 1. Chronic systolic congestive heart failure 2. Persistent atrial fibrillation 3. Diabetes mellitus 4. Hypertension 5. COPD  Chief Complaint  Patient presents with  . Follow-up    Previous notes from Jeremiah Perkins is a 56 y.o. male who presents for Scheduled follow-up visit.  The patient was recently hospitalized from 12/13/15 until 12/21/15 for acute respiratory insufficiency, community-acquired pneumonia, and exacerbation of acute on chronic systolic heart failure. He had a past history of paroxysmal atrial fibrillation. The patient underwent elective outpatient cardioversion on 10/26/13. Marland Kitchen He has a history of a nonischemic cardiomyopathy with previous catheterization showing no significant obstructive disease. His ejection fraction is estimated at 20-25%. He is diuresing on his current outpatient regimen. He still notes significant dyspnea with minimal exertion. Marland Kitchen He does have occasional chest discomfort. The patient also has a history of diabetes mellitus type 2 which is followed at the internal medicine clinic. He has a history of obstructive sleep apnea and morbid obesity and diabetic peripheral neuropathy. He has a past history of hypertension and gout. He has a past history of peripheral neuropathy and is on gabapentin The patient has finished the the cardiac rehabilitation program. The patient reiterated today that he is not interested in an ICD or a pacemaker. The patient states that his breathing has improved since hospitalization.  He is not coughing up any sputum.  His most recent echocardiogram on 12/14/15 showed an ejection fraction of 35-40%.  While in the hospital he was diuresed aggressively and was sent home on furosemide 160 mg twice a  day and Zaroxolyn 2.5 mg daily.  He is no longer on torsemide.  Lab work on February 3 showed increased BUN and creatinine and is diuretics were reduced.  He is now on Lasix 80 mg twice a day and metolazone 2.5 mg every other day Jeremiah Perkins at [facility] is his PCP  March 04, 2016:  Feels a little weak today  BP is 84/64 No CP  Has allergies which have been acting up recently .  No specific dizziness but does describe orthostasis.   Able to do normal household activities. Gets very short of breath walking up stairs.  He can walk a mile without stopping  But has to take his time .  Has been trying to lose weight for a long time.      Dec. 15, 2017:  Was admitted with urosepsis 2 weeks ago  Presented with fever and shortness - found to have pyelonephritis.  He had significant hypotension and received lots of fluid resuscitation. Because of the fluid resuscitation he went into congestive heart failure.  He was transferred to the ICU and was diuresed after that.    He was hospitalized for a total of 7 days.   Has not been able to exercise.   Past Medical History:  Diagnosis Date  . Anxiety   . Arthritis    involving knees, ankles, back, elbows (01/02/2015)  . Asthma   . Atrial flutter (Jeremiah Perkins)   . Cholelithiasis   . Chronic back pain   . Chronic bronchitis ([place])    "get it q yr" (01/02/2015)  . Congestive heart failure ([place])    No echo data available. Myocardial perfusion 2005 with EF 61%, presumed  diastolic HF   . COPD (chronic obstructive pulmonary disease) ([place])   . Diabetic peripheral neuropathy ([place])   . Diverticulosis    with history of diverticulitis in 10/2011  . GERD (gastroesophageal reflux disease)   . Gout   . Hyperlipidemia   . Hypertension   . Major depression   . Morbid obesity ([place])   . Myocardial infarction    "one doctor said I did back in 2014"  . OSA (obstructive sleep apnea) 1998   "wear my CPAP a couple times/month" (01/02/2015)  . Pneumonia  2013; 2014  . Psychosexual dysfunction with inhibited sexual excitement   . Shortness of breath    "all the time" (07/26/2013)  . Type II diabetes mellitus ([place]) dx'd 1991   previously on insulin in 2000 for 3-4 years (but was stopped because adamantly did not want to be on insulin)    Past Surgical History:  Procedure Laterality Date  . 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 ANKLE RECONSTRUCTION Right 1990's   2/2 trauma sustained after fall  . CARDIAC CATHETERIZATION  1990's; 07/2013  . CARDIOVERSION N/A 07/28/2013   Procedure: CARDIOVERSION;  Surgeon: Larey Dresser, MD;  [facility] END[facility]COPY;  Service: Cardiovascular;  Laterality: N/A;  . CARDIOVERSION N/A 10/26/2013   Procedure: CARDIOVERSION;  Surgeon: Jeremiah Coco, MD;  Location: [facility];  Service: Cardiovascular;  Laterality: N/A;  . CHOLECYSTECTOMY N/A 08/24/2015   Procedure: LAPAROSCOPIC CHOLECYSTECTOMY WITH INTRAOPERATIVE CHOLANGIOGRAM;  Surgeon: Jackolyn Confer, MD;  Location: [facility] ORS;  Service: General;  Laterality: N/A;  . CYST REMOVAL LEG Left 1960's   back of  leg  . FINGER AMPUTATION    . LEFT HEART CATHETERIZATION WITH CORONARY ANGIOGRAM N/A 07/27/2013   Procedure: LEFT HEART CATHETERIZATION WITH CORONARY ANGIOGRAM;  Surgeon: Peter M Martinique, MD;  Location[facility] CATH LAB;  Service: Cardiovascular;  Laterality: N/A;  . TEE WITHOUT CARDIOVERSION N/A 07/28/2013   Procedure: TRANSESOPHAGEAL ECHOCARDIOGRAM (TEE);  Surgeon: Larey Dresser, MD;  Location[facility] ENDOSCOPY;  Service: Cardiovascular;  Laterality: N/A;     Current Outpatient Prescriptions  Medication Sig Dispense Refill  . allopurinol (ZYLOPRIM) 300 MG tablet Take 300 mg by mouth daily.    Marland Kitchen amoxicillin-clavulanate (AUGMENTIN) 875-125 MG tablet Take 1 tablet by mouth every 12 (twelve) hours. 12 tablet 0  . Blood Glucose Monitoring Suppl (ACCU-CHEK AVIVA PLUS) w/Device KIT Use glucose meter to check blood sugars 4 times daily. 1 kit 0  . budesonide (PULMICORT)  0.5 MG/2ML nebulizer solution Take 4 mLs (1 mg total) by nebulization 2 (two) times daily. 60 mL 12  . buPROPion (WELLBUTRIN SR) 150 MG 12 hr tablet Take 1 tablet (150 mg total) by mouth 2 (two) times daily. 60 tablet 1  . carvedilol (COREG) 12.5 MG tablet Take 1 tablet (12.5 mg total) by mouth 2 (two) times daily with a meal. 180 tablet 3  . colchicine 0.6 MG tablet Take 1 tablet (0.6 mg total) by mouth daily. 30 tablet 0  . diclofenac sodium (VOLTAREN) 1 % GEL Apply 2 g topically 4 (four) times daily. 100 g 4  . fluticasone (FLONASE) 50 MCG/ACT nasal spray Use one spray in each nostril daily for seasonal allergy 16 g 3  . furosemide (LASIX) 80 MG tablet Take 1 tablet (80 mg total) by mouth daily. 30 tablet 0  . gabapentin (NEURONTIN) 600 MG tablet TAKE ONE TABLET BY MOUTH EVERY MORNING AND TAKE TWO TABLETS BY MOUTH EVERY EVENING 90 tablet 0  . glucose  blood (ACCU-CHEK AVIVA PLUS) test strip Use to check blood sugar four times daily. Dx: E11.8 100 each 11  . insulin glargine (LANTUS) 100 unit/mL SOPN Inject 0.2 mLs (20 Units total) into the skin at bedtime. 6 mL 0  . Insulin Pen Needle 31G X 8 MM MISC Use daily with the administration of lantus E11.8 100 each 1  . ipratropium (ATROVENT) 0.02 % nebulizer solution Take 2.5 mLs (0.5 mg total) by nebulization 2 (two) times daily. 75 mL 12  . levalbuterol (XOPENEX HFA) 45 MCG/ACT inhaler Inhale 1-2 puffs into the lungs every 4 (four) hours as needed for wheezing. Reported on 12/13/2015 1 Inhaler 2  . levalbuterol (XOPENEX) 0.63 MG/3ML nebulizer solution Take 3 mLs (0.63 mg total) by nebulization every 4 (four) hours as needed for wheezing. (Patient taking differently: Take 0.63 mg by nebulization 2 (two) times daily as needed for wheezing or shortness of breath. ) 60 mL 12  . losartan (COZAAR) 25 MG tablet Take 0.5 tablets (12.5 mg total) by mouth daily. Reported on 12/13/2015 45 tablet 2  . metolazone (ZAROXOLYN) 2.5 MG tablet Take 1 tablet (2.5 mg total)  by mouth See admin instructions. Take 1 tablet (2.5 mg) by mouth up to twice a week as needed for weight gain of 7 lbs in 24 hours 15 tablet 0  . oxyCODONE (OXY IR/ROXICODONE) 5 MG immediate release tablet Take 1-2 tablets (5-10 mg total) by mouth every 4 (four) hours as needed for moderate pain, severe pain or breakthrough pain. 100 tablet 0  . potassium chloride SA (K-DUR,KLOR-CON) 20 MEQ tablet Take 1 tablet (20 mEq total) by mouth daily. 30 tablet 0  . PRESCRIPTION MEDICATION Inhale into the lungs See admin instructions. Use CPAP whenever sleeping    . sildenafil (VIAGRA) 100 MG tablet Take 1 tablet (100 mg total) by mouth daily as needed for erectile dysfunction. Reported on 12/13/2015 10 tablet 0  . XARELTO 20 MG TABS tablet TAKE 1 TABLET (20 MG TOTAL) BY MOUTH DAILY WITH SUPPER. 30 tablet 11   No current facility-administered medications for this visit.     Allergies:   Patient has no known allergies.    Social History:  The patient  reports that he quit smoking about 13 months ago. His smoking use included Cigarettes. He has a 1.20 pack-year smoking history. He has never used smokeless tobacco. He reports that he drinks alcohol. He reports that he does not use drugs.   Family History:  The patient's family history includes Alzheimer's disease in his maternal grandmother and mother; Breast cancer in his paternal aunt; CAD (age of onset: 71) in his brother; CAD (age of onset: 72) in his mother; Diabetes in his maternal aunt, maternal grandmother, and mother; Gout in his father; Hypertension in his brother and mother; Lung cancer in his father; Seizures in his brother and paternal uncle; Stomach cancer in his father.    ROS:  Please see the history of present illness.   Otherwise, review of systems are positive for none.   All other systems are reviewed and negative.    PHYSICAL EXAM: VS:  BP 100/70 (BP Location: Right Arm, Patient Position: Sitting, Cuff Size: Large)   Pulse 80   Ht 5'  7" (1.702 m)   Wt 271 lb 12.8 oz (123.3 kg)   BMI 42.57 kg/m  , BMI Body mass index is 42.57 kg/m. GEN: Well nourished, well developed, in no acute distress  HEENT: normal  Neck: no JVD, carotid bruits, or  masses Cardiac: Irregularly irregular; no murmurs, rubs, or gallops,no edema  Respiratory:  clear to auscultation bilaterally, normal work of breathing GI: soft, nontender, nondistended, + BS MS: no deformity or atrophy  Skin: warm and dry, no rash Neuro:  Strength and sensation are intact Psych: euthymic mood, full affect   EKG:  EKG is ordered today. The ekg ordered today demonstrates Atrial fibrillation with rapid ventricular response 112 bpm.  Occasional PVCs.  Low voltage QRS.   Recent Labs: 10/14/2016: TSH 6.72 10/25/2016: ALT 20; B Natriuretic Peptide 282.8 10/27/2016: Magnesium 1.9 11/05/2016: BUN 30; Creat 1.31; Hemoglobin 13.6; Platelets 487; Potassium 4.0; Sodium 139    Lipid Panel    Component Value Date/Time   CHOL 196 04/04/2016 1200   TRIG 115 04/04/2016 1200   HDL 55 04/04/2016 1200   CHOLHDL 3.6 04/04/2016 1200   CHOLHDL 5.6 01/04/2015 0518   VLDL 40 01/04/2015 0518   LDLCALC 118 (H) 04/04/2016 1200      Wt Readings from Last 3 Encounters:  11/07/16 271 lb 12.8 oz (123.3 kg)  11/05/16 266 lb 3.2 oz (120.7 kg)  11/01/16 277 lb 1.6 oz (125.7 kg)    ASSESSMENT AND PLAN:  1. Persistent atrial flutter fibrillation, on xarelto 2. Chronic sy stolic heart failure  . Has EF 35-40%.    He is severely limited  - mostly by his morbid obesity  3. Diabetic peripheral neuropathy 4. Morbid obesity - advised him to lose weight . We discussed carb restriction . 5. COPD  - stopped smoking in 2016 6.  chronic kidney disease   Current medicines are reviewed at length with the patient today.  The patient does not have concerns regarding medicines.  The following changes have been made:  no change  Labs/ tests ordered today include:   No orders of the  defined types were placed in this encounter.   Mertie Moores, MD  11/07/2016 12:07 PM    [facility],  [place], [place]  [address] Pager [phone] Phone: [phone]; Fax: [phone]

## 2016-11-07 NOTE — Patient Instructions (Addendum)
Medication Instructions:  Your physician recommends that you continue on your current medications as directed. Please refer to the Current Medication list given to you today.   Labwork: Your physician recommends that you return for lab work in: at your next office visit in 6 months   Testing/Procedures: None Ordered   Follow-Up: Your physician wants you to follow-up in: 6 months with Dr. Elease Hashimoto.  You will receive a reminder letter in the mail two months in advance. If you don't receive a letter, please call our office to schedule the follow-up appointment.   If you need a refill on your cardiac medications before your next appointment, please call your pharmacy.   Thank you for choosing [facility]! Eligha Bridegroom, RN [phone]

## 2016-11-10 ENCOUNTER — Telehealth: Payer: Self-pay

## 2016-11-10 ENCOUNTER — Other Ambulatory Visit: Payer: Self-pay

## 2016-11-10 ENCOUNTER — Other Ambulatory Visit: Payer: Self-pay | Admitting: *Deleted

## 2016-11-10 MED ORDER — GABAPENTIN 600 MG PO TABS: ORAL_TABLET | ORAL | 1 refills | Status: AC

## 2016-11-10 NOTE — Patient Outreach (Signed)
[facility]) Care Management   11/10/2016  Jeremiah Perkins [birth year] [id number]  Jeremiah Perkins is an 54 y.o. male  Subjective:   Objective:   ROS I am still  A little short of breath when I walk. I have all this belly that is getting in my way, I am on prednisone and it makes me feel bad, eat a lot  Physical Exam  ROS  Encounter Medications:   Outpatient Encounter Prescriptions as of 11/10/2016  Medication Sig  . allopurinol (ZYLOPRIM) 300 MG tablet Take 300 mg by mouth daily.  . Blood Glucose Monitoring Suppl (ACCU-CHEK AVIVA PLUS) w/Device KIT Use glucose meter to check blood sugars 4 times daily.  . budesonide (PULMICORT) 0.5 MG/2ML nebulizer solution Take 4 mLs (1 mg total) by nebulization 2 (two) times daily.  Marland Kitchen buPROPion (WELLBUTRIN SR) 150 MG 12 hr tablet Take 1 tablet (150 mg total) by mouth 2 (two) times daily.  . carvedilol (COREG) 12.5 MG tablet Take 1 tablet (12.5 mg total) by mouth 2 (two) times daily with a meal.  . colchicine 0.6 MG tablet Take 1 tablet (0.6 mg total) by mouth daily.  . diclofenac sodium (VOLTAREN) 1 % GEL Apply 2 g topically 4 (four) times daily.  . fluticasone (FLONASE) 50 MCG/ACT nasal spray Use one spray in each nostril daily for seasonal allergy  . furosemide (LASIX) 80 MG tablet Take 1 tablet (80 mg total) by mouth daily.  Marland Kitchen gabapentin (NEURONTIN) 600 MG tablet TAKE ONE TABLET BY MOUTH EVERY MORNING AND TAKE TWO TABLETS BY MOUTH EVERY EVENING  . glucose blood (ACCU-CHEK AVIVA PLUS) test strip Use to check blood sugar four times daily. Dx: E11.8  . insulin glargine (LANTUS) 100 unit/mL SOPN Inject 0.2 mLs (20 Units total) into the skin at bedtime.  . Insulin Pen Needle 31G X 8 MM MISC Use daily with the administration of lantus E11.8  . ipratropium (ATROVENT) 0.02 % nebulizer solution Take 2.5 mLs (0.5 mg total) by nebulization 2 (two) times daily.  Marland Kitchen levalbuterol (XOPENEX HFA) 45 MCG/ACT inhaler Inhale 1-2 puffs into  the lungs every 4 (four) hours as needed for wheezing. Reported on 12/13/2015  . levalbuterol (XOPENEX) 0.63 MG/3ML nebulizer solution Take 3 mLs (0.63 mg total) by nebulization every 4 (four) hours as needed for wheezing. (Patient taking differently: Take 0.63 mg by nebulization 2 (two) times daily as needed for wheezing or shortness of breath. )  . losartan (COZAAR) 25 MG tablet Take 0.5 tablets (12.5 mg total) by mouth daily. Reported on 12/13/2015  . metolazone (ZAROXOLYN) 2.5 MG tablet Take 1 tablet (2.5 mg total) by mouth See admin instructions. Take 1 tablet (2.5 mg) by mouth up to twice a week as needed for weight gain of 7 lbs in 24 hours  . oxyCODONE (OXY IR/ROXICODONE) 5 MG immediate release tablet Take 1-2 tablets (5-10 mg total) by mouth every 4 (four) hours as needed for moderate pain, severe pain or breakthrough pain.  . potassium chloride SA (K-DUR,KLOR-CON) 20 MEQ tablet Take 1 tablet (20 mEq total) by mouth daily.  . sildenafil (VIAGRA) 100 MG tablet Take 1 tablet (100 mg total) by mouth daily as needed for erectile dysfunction. Reported on 12/13/2015  . XARELTO 20 MG TABS tablet TAKE 1 TABLET (20 MG TOTAL) BY MOUTH DAILY WITH SUPPER.  Marland Kitchen PRESCRIPTION MEDICATION Inhale into the lungs See admin instructions. Use CPAP whenever sleeping   No facility-administered encounter medications on file as of 11/10/2016.  Functional Status:   In your present state of health, do you have any difficulty performing the following activities: 11/03/2016 10/26/2016  Hearing? Y N  Vision? Y N  Difficulty concentrating or making decisions? Y N  Walking or climbing stairs? Y N  Dressing or bathing? N N  Doing errands, shopping? Y N  Preparing Food and eating ? N -  Using the Toilet? N -  In the past six months, have you accidently leaked urine? N -  Do you have problems with loss of bowel control? N -  Managing your Medications? N -  Managing your Finances? N -  Housekeeping or managing your  Housekeeping? Y -  Some recent data might be hidden    Fall/Depression Screening:    PHQ 2/9 Scores 11/10/2016 11/03/2016 05/30/2016 04/04/2016 03/04/2016 03/04/2016 01/04/2016  PHQ - 2 Score 0 0 0 0 1 0 0  PHQ- 9 Score - - - - - - -    Fall Risk  11/10/2016 11/05/2016 11/03/2016 09/30/2016 09/10/2016  Falls in the past year? _0   Number falls in past yr: 2 or more 2 or more 2 or more 1 2 or more  Injury with Fall? _1   Risk Factor Category  High Fall Risk - High Fall Risk - -  Risk for fall due to : History of fall(s);Impaired balance/gait;Impaired mobility;Impaired vision;Medication side effect - History of fall(s);Impaired balance/gait;Impaired mobility;Impaired vision;Medication side effect - -  Risk for fall due to (comments): - - - - -  Follow up Falls evaluation completed - Follow up appointment - -   [facility] CM Care Plan Problem One   Flowsheet Row Most Recent Value  Care Plan Problem One  patiient had a recent acute care admission  Role Documenting the Problem One  Care Management Roundup for Problem One  Active  [facility] Long Term Goal (31-90 days)  In the next 31 days, patient will have no acute care admissions for heart failure  THN Long Term Goal Start Date  11/03/16  Interventions for Problem One Long Term Goal  transiiton of care call made ot assess barriers   [facility] CM Short Term Goal #1 (0-30 days)  In the next 15 days, patient will meet with [facility] for heart failure educaiton, assessment of community care coordination needs  [facility] CM Short Term Goal #1 Start Date  11/03/16  [facility] CM Short Term Goal #1 Met Date  11/10/16  Interventions for Short Term Goal #1  Initial home visit for assessment of community care coordination  [facility] CM Short Term Goal #2 (0-30 days)  In the next 21 days, patient would have weighed himself 14 out of 21 times  [facility] CM Short Term Goal #2 Start Date  11/03/16  [facility] CM Short Term Goal #2 Met Date  11/10/16    [facility] CM Care  Plan Problem Two   Flowsheet Row Most Recent Value  Care Plan Problem Two  Adjustment disorder  Role Documenting the Problem Two  Care Management Coordinator  Care Plan for Problem Two  Active  [facility]N CM Short Term Goal #1 (0-30 days)  In the next 28 days, patient meet with therapist for psychosocial evaluation  [facility] CM Short Term Goal #1 Start Date  11/10/16  Interventions for Short Term Goal #2   Initial home visit for assessment of community care coordination needs     Assessment:   Patient expressed being overburdened, angry over having  to listen to people tell home what to do, how to act and dealing with caring for his brother. Patient has agreed to referral for psychosocial support, referral made to Hunt Oris, LCSW Patient expressed wanting to have bariatric surgery, has already completed the classes required for surgery Call made to primary care physician to update PCP on patient's decision, requested recommendation.  PCP recommended patient make choice between [place] or [place].  Plan:   Home visit later this month for assessment of community care coordination needs

## 2016-11-10 NOTE — Telephone Encounter (Signed)
Patient requests reorder of his Viagra 100mg . ID # [id number]. Re order #[id number] and will ship in 7-10 days.

## 2016-11-10 NOTE — Telephone Encounter (Signed)
Viagra 100mg  re ordered today. Patient ID #[id number]. Confirmation # [id number].

## 2016-11-10 NOTE — Telephone Encounter (Signed)
Patient requested refill

## 2016-11-11 ENCOUNTER — Other Ambulatory Visit: Payer: Self-pay

## 2016-11-11 NOTE — Patient Outreach (Signed)
    Unsuccessful attempt made to contact patient via telephone to follow up with EMMI Red Alert. HIPPA compliant message left for patient with this RNCM contact information.   Plan: Make attempt to contact patient later this month

## 2016-11-11 NOTE — Patient Outreach (Signed)
Patient triggered Red on EMMI heart failure dashboard, notification sent to Pamela Faulkner, RN 

## 2016-11-18 ENCOUNTER — Other Ambulatory Visit: Payer: Self-pay

## 2016-11-18 NOTE — Patient Outreach (Signed)
     Unsuccessful attempt made to contact patient via telephone to follow up with an EMMI Alert received. HIPPA compliant message left for patient with this RNCM's contact information.   Plan: Make another attempt to contact patient within the next 30 days via telephone

## 2016-11-20 ENCOUNTER — Other Ambulatory Visit: Payer: Self-pay

## 2016-11-20 NOTE — Patient Outreach (Addendum)
   This RNCM made telephone call to patient to assess his progress in meeting his case managmeent goals Patient answered telephone call, RNCM stated her name and place of employment. Patient stated, "Jeremiah Perkins, I am busy trying to get my brother situated.  He has been to the hospital twice, the assisted living keeps sending it out.  He keeps peeing on himself."  RNCM asked patient what can she do to assist with situation. Patient stated I don't know.  RNCM advised patient as a veteran, his brother should have a case Production designer, theatre/television/film, he should attempt to contact the assigned case manager for assistance.  RNCM advised patient she would call back at a better time. Patient immediately disconnected the line.  Plan: Make contact with patient within the next 30 days to assess his need for community care coordination

## 2016-11-25 ENCOUNTER — Other Ambulatory Visit: Payer: Self-pay | Admitting: *Deleted

## 2016-11-25 ENCOUNTER — Telehealth: Payer: Self-pay | Admitting: *Deleted

## 2016-11-25 MED ORDER — FUROSEMIDE 80 MG PO TABS: 80.0000 mg | ORAL_TABLET | Freq: Every day | ORAL | 1 refills | Status: AC

## 2016-11-25 NOTE — Telephone Encounter (Signed)
Patient called and stated that he has been having headaches for the last 4 days. Stated that he had started on a Prednisone Taper from Dr. Ines Bloomer office and thinks it is coming from the Prednisone. I instructed him to call Dr. Ines Bloomer office to inform them of the side effect and gave him the number [phone] because he does not have access to the phone number.

## 2016-11-25 NOTE — Telephone Encounter (Signed)
[employer] [facility] 

## 2016-11-26 NOTE — Telephone Encounter (Signed)
He really needs to be seen for this. I cannot dx over the phone. Hydrocodone and oxycodone are only going to make HA worse. Recommend he stops prednisone

## 2016-11-26 NOTE — Telephone Encounter (Signed)
Patient stated that he called Dr. Ines Bloomer office and they told him his Headache would not be coming from the Prednisone to call his PCP. Scheduled an appointment for 2016/12/13 with Dr. Renato Gails.

## 2016-11-26 NOTE — Telephone Encounter (Signed)
Patient called this morning and stated that he fell out of bed and hit his head a few days ago and had also fell before Christmas and hit his head and now the last 5 days been having Headaches. Stated that he is taking his wife's Hydrocodone to ease the pain because he is out of his Oxycodone. Does not want to come in for an appointment because his brother is in the hospital in [place]. Please Advise.

## 2016-11-27 ENCOUNTER — Emergency Department (HOSPITAL_COMMUNITY): Payer: PPO

## 2016-11-27 ENCOUNTER — Inpatient Hospital Stay (HOSPITAL_COMMUNITY): Payer: PPO

## 2016-11-27 ENCOUNTER — Encounter (HOSPITAL_COMMUNITY): Payer: Self-pay | Admitting: Emergency Medicine

## 2016-11-27 ENCOUNTER — Ambulatory Visit: Payer: PPO | Admitting: Internal Medicine

## 2016-11-27 ENCOUNTER — Inpatient Hospital Stay (HOSPITAL_COMMUNITY)
Admission: EM | Admit: 2016-11-27 | Discharge: 2016-12-25 | DRG: 004 | Disposition: E | Payer: PPO | Attending: Pulmonary Disease | Admitting: Pulmonary Disease

## 2016-11-27 DIAGNOSIS — E1122 Type 2 diabetes mellitus with diabetic chronic kidney disease: Secondary | ICD-10-CM | POA: Diagnosis not present

## 2016-11-27 DIAGNOSIS — J9601 Acute respiratory failure with hypoxia: Secondary | ICD-10-CM | POA: Diagnosis not present

## 2016-11-27 DIAGNOSIS — W19XXXA Unspecified fall, initial encounter: Secondary | ICD-10-CM | POA: Diagnosis present

## 2016-11-27 DIAGNOSIS — I509 Heart failure, unspecified: Secondary | ICD-10-CM | POA: Diagnosis not present

## 2016-11-27 DIAGNOSIS — E1165 Type 2 diabetes mellitus with hyperglycemia: Secondary | ICD-10-CM | POA: Diagnosis present

## 2016-11-27 DIAGNOSIS — N179 Acute kidney failure, unspecified: Secondary | ICD-10-CM | POA: Diagnosis not present

## 2016-11-27 DIAGNOSIS — M109 Gout, unspecified: Secondary | ICD-10-CM | POA: Diagnosis not present

## 2016-11-27 DIAGNOSIS — E870 Hyperosmolality and hypernatremia: Secondary | ICD-10-CM | POA: Diagnosis not present

## 2016-11-27 DIAGNOSIS — E118 Type 2 diabetes mellitus with unspecified complications: Secondary | ICD-10-CM | POA: Diagnosis not present

## 2016-11-27 DIAGNOSIS — G8191 Hemiplegia, unspecified affecting right dominant side: Secondary | ICD-10-CM | POA: Diagnosis not present

## 2016-11-27 DIAGNOSIS — N170 Acute kidney failure with tubular necrosis: Secondary | ICD-10-CM | POA: Diagnosis not present

## 2016-11-27 DIAGNOSIS — I6200 Nontraumatic subdural hemorrhage, unspecified: Secondary | ICD-10-CM | POA: Diagnosis not present

## 2016-11-27 DIAGNOSIS — E876 Hypokalemia: Secondary | ICD-10-CM | POA: Diagnosis not present

## 2016-11-27 DIAGNOSIS — Z515 Encounter for palliative care: Secondary | ICD-10-CM

## 2016-11-27 DIAGNOSIS — Z87891 Personal history of nicotine dependence: Secondary | ICD-10-CM

## 2016-11-27 DIAGNOSIS — J90 Pleural effusion, not elsewhere classified: Secondary | ICD-10-CM | POA: Diagnosis not present

## 2016-11-27 DIAGNOSIS — F1490 Cocaine use, unspecified, uncomplicated: Secondary | ICD-10-CM | POA: Diagnosis not present

## 2016-11-27 DIAGNOSIS — E7220 Disorder of urea cycle metabolism, unspecified: Secondary | ICD-10-CM | POA: Diagnosis not present

## 2016-11-27 DIAGNOSIS — F1122 Opioid dependence with intoxication: Secondary | ICD-10-CM | POA: Diagnosis not present

## 2016-11-27 DIAGNOSIS — Z6841 Body Mass Index (BMI) 40.0 and over, adult: Secondary | ICD-10-CM | POA: Diagnosis not present

## 2016-11-27 DIAGNOSIS — I10 Essential (primary) hypertension: Secondary | ICD-10-CM | POA: Diagnosis not present

## 2016-11-27 DIAGNOSIS — I4892 Unspecified atrial flutter: Secondary | ICD-10-CM | POA: Diagnosis present

## 2016-11-27 DIAGNOSIS — G9349 Other encephalopathy: Secondary | ICD-10-CM | POA: Diagnosis not present

## 2016-11-27 DIAGNOSIS — E781 Pure hyperglyceridemia: Secondary | ICD-10-CM | POA: Diagnosis not present

## 2016-11-27 DIAGNOSIS — I252 Old myocardial infarction: Secondary | ICD-10-CM

## 2016-11-27 DIAGNOSIS — Z01818 Encounter for other preprocedural examination: Secondary | ICD-10-CM

## 2016-11-27 DIAGNOSIS — Z7901 Long term (current) use of anticoagulants: Secondary | ICD-10-CM

## 2016-11-27 DIAGNOSIS — J811 Chronic pulmonary edema: Secondary | ICD-10-CM | POA: Diagnosis not present

## 2016-11-27 DIAGNOSIS — F1290 Cannabis use, unspecified, uncomplicated: Secondary | ICD-10-CM | POA: Diagnosis not present

## 2016-11-27 DIAGNOSIS — E872 Acidosis: Secondary | ICD-10-CM | POA: Diagnosis present

## 2016-11-27 DIAGNOSIS — Z452 Encounter for adjustment and management of vascular access device: Secondary | ICD-10-CM | POA: Diagnosis not present

## 2016-11-27 DIAGNOSIS — J449 Chronic obstructive pulmonary disease, unspecified: Secondary | ICD-10-CM | POA: Diagnosis present

## 2016-11-27 DIAGNOSIS — Z7951 Long term (current) use of inhaled steroids: Secondary | ICD-10-CM

## 2016-11-27 DIAGNOSIS — I2510 Atherosclerotic heart disease of native coronary artery without angina pectoris: Secondary | ICD-10-CM | POA: Diagnosis present

## 2016-11-27 DIAGNOSIS — E11649 Type 2 diabetes mellitus with hypoglycemia without coma: Secondary | ICD-10-CM | POA: Diagnosis not present

## 2016-11-27 DIAGNOSIS — Z789 Other specified health status: Secondary | ICD-10-CM

## 2016-11-27 DIAGNOSIS — K219 Gastro-esophageal reflux disease without esophagitis: Secondary | ICD-10-CM | POA: Diagnosis present

## 2016-11-27 DIAGNOSIS — R4701 Aphasia: Secondary | ICD-10-CM | POA: Diagnosis not present

## 2016-11-27 DIAGNOSIS — A419 Sepsis, unspecified organism: Secondary | ICD-10-CM | POA: Diagnosis not present

## 2016-11-27 DIAGNOSIS — Z89022 Acquired absence of left finger(s): Secondary | ICD-10-CM

## 2016-11-27 DIAGNOSIS — Z8249 Family history of ischemic heart disease and other diseases of the circulatory system: Secondary | ICD-10-CM

## 2016-11-27 DIAGNOSIS — S065X0A Traumatic subdural hemorrhage without loss of consciousness, initial encounter: Secondary | ICD-10-CM | POA: Diagnosis not present

## 2016-11-27 DIAGNOSIS — R569 Unspecified convulsions: Secondary | ICD-10-CM

## 2016-11-27 DIAGNOSIS — S065XAA Traumatic subdural hemorrhage with loss of consciousness status unknown, initial encounter: Secondary | ICD-10-CM

## 2016-11-27 DIAGNOSIS — R509 Fever, unspecified: Secondary | ICD-10-CM

## 2016-11-27 DIAGNOSIS — R531 Weakness: Secondary | ICD-10-CM | POA: Diagnosis not present

## 2016-11-27 DIAGNOSIS — J0410 Acute tracheitis without obstruction: Secondary | ICD-10-CM | POA: Diagnosis not present

## 2016-11-27 DIAGNOSIS — I129 Hypertensive chronic kidney disease with stage 1 through stage 4 chronic kidney disease, or unspecified chronic kidney disease: Secondary | ICD-10-CM | POA: Diagnosis not present

## 2016-11-27 DIAGNOSIS — G4733 Obstructive sleep apnea (adult) (pediatric): Secondary | ICD-10-CM | POA: Diagnosis present

## 2016-11-27 DIAGNOSIS — T41295A Adverse effect of other general anesthetics, initial encounter: Secondary | ICD-10-CM | POA: Diagnosis not present

## 2016-11-27 DIAGNOSIS — R6521 Severe sepsis with septic shock: Secondary | ICD-10-CM | POA: Diagnosis not present

## 2016-11-27 DIAGNOSIS — G9340 Encephalopathy, unspecified: Secondary | ICD-10-CM | POA: Diagnosis not present

## 2016-11-27 DIAGNOSIS — F329 Major depressive disorder, single episode, unspecified: Secondary | ICD-10-CM | POA: Diagnosis present

## 2016-11-27 DIAGNOSIS — S066X9A Traumatic subarachnoid hemorrhage with loss of consciousness of unspecified duration, initial encounter: Secondary | ICD-10-CM | POA: Diagnosis present

## 2016-11-27 DIAGNOSIS — R05 Cough: Secondary | ICD-10-CM | POA: Diagnosis not present

## 2016-11-27 DIAGNOSIS — J189 Pneumonia, unspecified organism: Secondary | ICD-10-CM | POA: Diagnosis not present

## 2016-11-27 DIAGNOSIS — E785 Hyperlipidemia, unspecified: Secondary | ICD-10-CM | POA: Diagnosis present

## 2016-11-27 DIAGNOSIS — I609 Nontraumatic subarachnoid hemorrhage, unspecified: Secondary | ICD-10-CM | POA: Diagnosis not present

## 2016-11-27 DIAGNOSIS — E1142 Type 2 diabetes mellitus with diabetic polyneuropathy: Secondary | ICD-10-CM | POA: Diagnosis not present

## 2016-11-27 DIAGNOSIS — J9690 Respiratory failure, unspecified, unspecified whether with hypoxia or hypercapnia: Secondary | ICD-10-CM | POA: Diagnosis not present

## 2016-11-27 DIAGNOSIS — F1020 Alcohol dependence, uncomplicated: Secondary | ICD-10-CM | POA: Diagnosis not present

## 2016-11-27 DIAGNOSIS — Z9049 Acquired absence of other specified parts of digestive tract: Secondary | ICD-10-CM

## 2016-11-27 DIAGNOSIS — I959 Hypotension, unspecified: Secondary | ICD-10-CM | POA: Diagnosis not present

## 2016-11-27 DIAGNOSIS — G40901 Epilepsy, unspecified, not intractable, with status epilepticus: Secondary | ICD-10-CM | POA: Diagnosis present

## 2016-11-27 DIAGNOSIS — I482 Chronic atrial fibrillation: Secondary | ICD-10-CM | POA: Diagnosis present

## 2016-11-27 DIAGNOSIS — R296 Repeated falls: Secondary | ICD-10-CM | POA: Diagnosis present

## 2016-11-27 DIAGNOSIS — I5022 Chronic systolic (congestive) heart failure: Secondary | ICD-10-CM | POA: Diagnosis not present

## 2016-11-27 DIAGNOSIS — J9600 Acute respiratory failure, unspecified whether with hypoxia or hypercapnia: Secondary | ICD-10-CM | POA: Diagnosis not present

## 2016-11-27 DIAGNOSIS — I130 Hypertensive heart and chronic kidney disease with heart failure and stage 1 through stage 4 chronic kidney disease, or unspecified chronic kidney disease: Secondary | ICD-10-CM | POA: Diagnosis present

## 2016-11-27 DIAGNOSIS — N183 Chronic kidney disease, stage 3 (moderate): Secondary | ICD-10-CM | POA: Diagnosis not present

## 2016-11-27 DIAGNOSIS — B9689 Other specified bacterial agents as the cause of diseases classified elsewhere: Secondary | ICD-10-CM | POA: Diagnosis not present

## 2016-11-27 DIAGNOSIS — R0603 Acute respiratory distress: Secondary | ICD-10-CM | POA: Diagnosis not present

## 2016-11-27 DIAGNOSIS — Z4682 Encounter for fitting and adjustment of non-vascular catheter: Secondary | ICD-10-CM | POA: Diagnosis not present

## 2016-11-27 DIAGNOSIS — R404 Transient alteration of awareness: Secondary | ICD-10-CM | POA: Diagnosis not present

## 2016-11-27 DIAGNOSIS — A411 Sepsis due to other specified staphylococcus: Secondary | ICD-10-CM | POA: Diagnosis not present

## 2016-11-27 DIAGNOSIS — Z4659 Encounter for fitting and adjustment of other gastrointestinal appliance and device: Secondary | ICD-10-CM

## 2016-11-27 DIAGNOSIS — R4182 Altered mental status, unspecified: Secondary | ICD-10-CM

## 2016-11-27 DIAGNOSIS — Z79899 Other long term (current) drug therapy: Secondary | ICD-10-CM

## 2016-11-27 DIAGNOSIS — Z7189 Other specified counseling: Secondary | ICD-10-CM | POA: Diagnosis not present

## 2016-11-27 DIAGNOSIS — I5042 Chronic combined systolic (congestive) and diastolic (congestive) heart failure: Secondary | ICD-10-CM | POA: Diagnosis present

## 2016-11-27 DIAGNOSIS — R1310 Dysphagia, unspecified: Secondary | ICD-10-CM | POA: Diagnosis present

## 2016-11-27 DIAGNOSIS — Z9911 Dependence on respirator [ventilator] status: Secondary | ICD-10-CM | POA: Diagnosis not present

## 2016-11-27 DIAGNOSIS — Z794 Long term (current) use of insulin: Secondary | ICD-10-CM

## 2016-11-27 DIAGNOSIS — S066X0A Traumatic subarachnoid hemorrhage without loss of consciousness, initial encounter: Secondary | ICD-10-CM | POA: Diagnosis not present

## 2016-11-27 DIAGNOSIS — N189 Chronic kidney disease, unspecified: Secondary | ICD-10-CM | POA: Diagnosis not present

## 2016-11-27 DIAGNOSIS — T420X5A Adverse effect of hydantoin derivatives, initial encounter: Secondary | ICD-10-CM | POA: Diagnosis not present

## 2016-11-27 DIAGNOSIS — S065X9A Traumatic subdural hemorrhage with loss of consciousness of unspecified duration, initial encounter: Secondary | ICD-10-CM | POA: Diagnosis not present

## 2016-11-27 DIAGNOSIS — L270 Generalized skin eruption due to drugs and medicaments taken internally: Secondary | ICD-10-CM | POA: Diagnosis not present

## 2016-11-27 DIAGNOSIS — R0902 Hypoxemia: Secondary | ICD-10-CM

## 2016-11-27 DIAGNOSIS — F1410 Cocaine abuse, uncomplicated: Secondary | ICD-10-CM | POA: Diagnosis present

## 2016-11-27 DIAGNOSIS — M545 Low back pain: Secondary | ICD-10-CM | POA: Diagnosis not present

## 2016-11-27 LAB — COMPREHENSIVE METABOLIC PANEL
ALK PHOS: 77 U/L (ref 38–126)
ALT: 32 U/L (ref 17–63)
ANION GAP: 12 (ref 5–15)
AST: 39 U/L (ref 15–41)
Albumin: 2.8 g/dL — ABNORMAL LOW (ref 3.5–5.0)
BILIRUBIN TOTAL: 1 mg/dL (ref 0.3–1.2)
BUN: 35 mg/dL — ABNORMAL HIGH (ref 6–20)
CALCIUM: 8.9 mg/dL (ref 8.9–10.3)
CO2: 29 mmol/L (ref 22–32)
Chloride: 93 mmol/L — ABNORMAL LOW (ref 101–111)
Creatinine, Ser: 1.06 mg/dL (ref 0.61–1.24)
GFR calc non Af Amer: >60 mL/min (ref >60)
Glucose, Bld: 228 mg/dL — ABNORMAL HIGH (ref 65–99)
Potassium: 3.1 mmol/L — ABNORMAL LOW (ref 3.5–5.1)
Sodium: 134 mmol/L — ABNORMAL LOW (ref 135–145)
TOTAL PROTEIN: 6.5 g/dL (ref 6.5–8.1)

## 2016-11-27 LAB — URINALYSIS, ROUTINE W REFLEX MICROSCOPIC
Bilirubin Urine: NEGATIVE
Glucose, UA: 50 mg/dL — AB
Hgb urine dipstick: NEGATIVE
KETONES UR: NEGATIVE mg/dL
Leukocytes, UA: NEGATIVE
Nitrite: NEGATIVE
PH: 5 (ref 5.0–8.0)
Protein, ur: NEGATIVE mg/dL
Specific Gravity, Urine: 1.013 (ref 1.005–1.030)

## 2016-11-27 LAB — RESPIRATORY PANEL BY PCR
ADENOVIRUS-RVPPCR: NOT DETECTED
Bordetella pertussis: NOT DETECTED
CHLAMYDOPHILA PNEUMONIAE-RVPPCR: NOT DETECTED
CORONAVIRUS 229E-RVPPCR: NOT DETECTED
CORONAVIRUS OC43-RVPPCR: NOT DETECTED
Coronavirus HKU1: NOT DETECTED
Coronavirus NL63: NOT DETECTED
INFLUENZA A-RVPPCR: NOT DETECTED
INFLUENZA B-RVPPCR: NOT DETECTED
METAPNEUMOVIRUS-RVPPCR: NOT DETECTED
MYCOPLASMA PNEUMONIAE-RVPPCR: NOT DETECTED
PARAINFLUENZA VIRUS 3-RVPPCR: NOT DETECTED
Parainfluenza Virus 1: NOT DETECTED
Parainfluenza Virus 2: NOT DETECTED
Parainfluenza Virus 4: NOT DETECTED
Respiratory Syncytial Virus: NOT DETECTED
Rhinovirus / Enterovirus: NOT DETECTED

## 2016-11-27 LAB — I-STAT CG4 LACTIC ACID, ED
LACTIC ACID, VENOUS: 2.14 mmol/L — AB (ref 0.5–1.9)
Lactic Acid, Venous: 2.25 mmol/L (ref 0.5–1.9)

## 2016-11-27 LAB — BASIC METABOLIC PANEL
ANION GAP: 13 (ref 5–15)
BUN: 28 mg/dL — ABNORMAL HIGH (ref 6–20)
CHLORIDE: 95 mmol/L — AB (ref 101–111)
CO2: 28 mmol/L (ref 22–32)
Calcium: 8.7 mg/dL — ABNORMAL LOW (ref 8.9–10.3)
Creatinine, Ser: 1.02 mg/dL (ref 0.61–1.24)
GFR calc Af Amer: >60 mL/min (ref >60)
GLUCOSE: 230 mg/dL — AB (ref 65–99)
POTASSIUM: 2.8 mmol/L — AB (ref 3.5–5.1)
Sodium: 136 mmol/L (ref 135–145)

## 2016-11-27 LAB — CBC WITH DIFFERENTIAL/PLATELET
BASOS ABS: 0 10*3/uL (ref 0.0–0.1)
Basophils Relative: 0 %
Eosinophils Absolute: 0.1 10*3/uL (ref 0.0–0.7)
Eosinophils Relative: 1 %
HEMATOCRIT: 41.3 % (ref 39.0–52.0)
HEMOGLOBIN: 13.5 g/dL (ref 13.0–17.0)
Lymphocytes Relative: 17 %
Lymphs Abs: 1.6 10*3/uL (ref 0.7–4.0)
MCH: 29.8 pg (ref 26.0–34.0)
MCHC: 32.7 g/dL (ref 30.0–36.0)
MCV: 91.2 fL (ref 78.0–100.0)
MONO ABS: 0.4 10*3/uL (ref 0.1–1.0)
Monocytes Relative: 4 %
NEUTROS ABS: 7.4 10*3/uL (ref 1.7–7.7)
NEUTROS PCT: 78 %
Platelets: 168 10*3/uL (ref 150–400)
RBC: 4.53 MIL/uL (ref 4.22–5.81)
RDW: 17.6 % — AB (ref 11.5–15.5)
WBC: 9.6 10*3/uL (ref 4.0–10.5)

## 2016-11-27 LAB — LACTIC ACID, PLASMA
Lactic Acid, Venous: 1.5 mmol/L (ref 0.5–1.9)
Lactic Acid, Venous: 1.5 mmol/L (ref 0.5–1.9)

## 2016-11-27 LAB — CBC
HEMATOCRIT: 38 % — AB (ref 39.0–52.0)
HEMOGLOBIN: 12.7 g/dL — AB (ref 13.0–17.0)
MCH: 30.1 pg (ref 26.0–34.0)
MCHC: 33.4 g/dL (ref 30.0–36.0)
MCV: 90 fL (ref 78.0–100.0)
Platelets: 157 10*3/uL (ref 150–400)
RBC: 4.22 MIL/uL (ref 4.22–5.81)
RDW: 17.3 % — ABNORMAL HIGH (ref 11.5–15.5)
WBC: 12.1 10*3/uL — AB (ref 4.0–10.5)

## 2016-11-27 LAB — RAPID URINE DRUG SCREEN, HOSP PERFORMED
Amphetamines: NOT DETECTED
BARBITURATES: NOT DETECTED
BENZODIAZEPINES: NOT DETECTED
Cocaine: POSITIVE — AB
Opiates: NOT DETECTED
Tetrahydrocannabinol: POSITIVE — AB

## 2016-11-27 LAB — MRSA PCR SCREENING: MRSA by PCR: NEGATIVE

## 2016-11-27 LAB — ABO/RH: ABO/RH(D): O POS

## 2016-11-27 LAB — INFLUENZA PANEL BY PCR (TYPE A & B)
INFLAPCR: NEGATIVE
INFLBPCR: NEGATIVE

## 2016-11-27 LAB — GLUCOSE, CAPILLARY
GLUCOSE-CAPILLARY: 102 mg/dL — AB (ref 65–99)
Glucose-Capillary: 152 mg/dL — ABNORMAL HIGH (ref 65–99)

## 2016-11-27 LAB — TROPONIN I
TROPONIN I: 0.1 ng/mL — AB (ref <0.03)
Troponin I: 0.15 ng/mL (ref <0.03)

## 2016-11-27 LAB — PHOSPHORUS: Phosphorus: 3.8 mg/dL (ref 2.5–4.6)

## 2016-11-27 LAB — CBG MONITORING, ED: GLUCOSE-CAPILLARY: 244 mg/dL — AB (ref 65–99)

## 2016-11-27 LAB — MAGNESIUM: Magnesium: 1.7 mg/dL (ref 1.7–2.4)

## 2016-11-27 LAB — PROCALCITONIN: Procalcitonin: 0.32 ng/mL

## 2016-11-27 LAB — PROTIME-INR
INR: 1.8
PROTHROMBIN TIME: 21.2 s — AB (ref 11.4–15.2)

## 2016-11-27 MED ORDER — PIPERACILLIN-TAZOBACTAM 3.375 G IVPB 30 MIN
3.3750 g | Freq: Once | INTRAVENOUS | Status: AC
  Administered 2016-11-27: 3.375 g via INTRAVENOUS
  Filled 2016-11-27: qty 50

## 2016-11-27 MED ORDER — LORAZEPAM 2 MG/ML IJ SOLN
INTRAMUSCULAR | Status: AC
  Filled 2016-11-27: qty 1

## 2016-11-27 MED ORDER — PIPERACILLIN-TAZOBACTAM 3.375 G IVPB
3.3750 g | Freq: Three times a day (TID) | INTRAVENOUS | Status: DC
  Filled 2016-11-27 (×2): qty 50

## 2016-11-27 MED ORDER — MORPHINE SULFATE (PF) 2 MG/ML IV SOLN
2.0000 mg | INTRAVENOUS | Status: DC | PRN
  Administered 2016-11-27: 2 mg via INTRAVENOUS
  Filled 2016-11-27: qty 1

## 2016-11-27 MED ORDER — CHLORHEXIDINE GLUCONATE 0.12 % MT SOLN
15.0000 mL | Freq: Two times a day (BID) | OROMUCOSAL | Status: DC
  Administered 2016-11-27: 15 mL via OROMUCOSAL
  Filled 2016-11-27: qty 15

## 2016-11-27 MED ORDER — LEVALBUTEROL HCL 0.63 MG/3ML IN NEBU
0.6300 mg | INHALATION_SOLUTION | Freq: Two times a day (BID) | RESPIRATORY_TRACT | Status: DC | PRN
  Administered 2016-12-09 – 2016-12-13 (×5): 0.63 mg via RESPIRATORY_TRACT
  Filled 2016-11-27 (×5): qty 3

## 2016-11-27 MED ORDER — LORAZEPAM 2 MG/ML IJ SOLN
2.0000 mg | INTRAMUSCULAR | Status: DC | PRN
  Administered 2016-11-27 – 2016-12-12 (×3): 2 mg via INTRAVENOUS
  Filled 2016-11-27 (×3): qty 1

## 2016-11-27 MED ORDER — BUDESONIDE 0.5 MG/2ML IN SUSP
1.0000 mg | Freq: Two times a day (BID) | RESPIRATORY_TRACT | Status: DC
  Administered 2016-11-27 – 2016-11-28 (×2): 1 mg via RESPIRATORY_TRACT
  Filled 2016-11-27 (×2): qty 4

## 2016-11-27 MED ORDER — LORAZEPAM 2 MG/ML IJ SOLN: 1.0000 mg | Freq: Once | INTRAMUSCULAR | Status: AC

## 2016-11-27 MED ORDER — POTASSIUM CHLORIDE CRYS ER 20 MEQ PO TBCR: 40.0000 meq | EXTENDED_RELEASE_TABLET | Freq: Two times a day (BID) | ORAL | Status: DC

## 2016-11-27 MED ORDER — SODIUM CHLORIDE 0.9 % IV SOLN
10.0000 mL/h | Freq: Once | INTRAVENOUS | Status: AC
  Administered 2016-11-27: 10 mL/h via INTRAVENOUS

## 2016-11-27 MED ORDER — SODIUM CHLORIDE 0.9 % IV SOLN
250.0000 mL | INTRAVENOUS | Status: DC | PRN
  Administered 2016-11-27: 1000 mL via INTRAVENOUS
  Administered 2016-12-03: 250 mL via INTRAVENOUS

## 2016-11-27 MED ORDER — MORPHINE SULFATE (PF) 4 MG/ML IV SOLN
4.0000 mg | INTRAVENOUS | Status: AC
  Administered 2016-11-27: 4 mg via INTRAVENOUS
  Filled 2016-11-27: qty 1

## 2016-11-27 MED ORDER — LEVETIRACETAM 500 MG/5ML IV SOLN
1000.0000 mg | Freq: Once | INTRAVENOUS | Status: AC
  Administered 2016-11-27: 1000 mg via INTRAVENOUS
  Filled 2016-11-27: qty 10

## 2016-11-27 MED ORDER — FENTANYL CITRATE (PF) 100 MCG/2ML IJ SOLN
INTRAMUSCULAR | Status: AC
  Filled 2016-11-27: qty 2

## 2016-11-27 MED ORDER — IPRATROPIUM BROMIDE 0.02 % IN SOLN
0.5000 mg | Freq: Two times a day (BID) | RESPIRATORY_TRACT | Status: DC
  Administered 2016-11-27 – 2016-12-08 (×22): 0.5 mg via RESPIRATORY_TRACT
  Filled 2016-11-27 (×23): qty 2.5

## 2016-11-27 MED ORDER — HYDRALAZINE HCL 20 MG/ML IJ SOLN
INTRAMUSCULAR | Status: AC
  Filled 2016-11-27: qty 1

## 2016-11-27 MED ORDER — ONDANSETRON HCL 4 MG PO TABS: 4.0000 mg | ORAL_TABLET | Freq: Four times a day (QID) | ORAL | Status: DC | PRN

## 2016-11-27 MED ORDER — INSULIN ASPART 100 UNIT/ML ~~LOC~~ SOLN
2.0000 [IU] | SUBCUTANEOUS | Status: DC
  Administered 2016-11-27: 4 [IU] via SUBCUTANEOUS
  Administered 2016-11-28: 2 [IU] via SUBCUTANEOUS
  Administered 2016-11-28 (×2): 4 [IU] via SUBCUTANEOUS
  Administered 2016-11-29: 6 [IU] via SUBCUTANEOUS
  Administered 2016-11-29 (×4): 4 [IU] via SUBCUTANEOUS
  Administered 2016-11-29: 6 [IU] via SUBCUTANEOUS
  Administered 2016-11-29: 4 [IU] via SUBCUTANEOUS
  Administered 2016-11-30 (×2): 6 [IU] via SUBCUTANEOUS
  Administered 2016-11-30 (×2): 4 [IU] via SUBCUTANEOUS
  Administered 2016-11-30: 6 [IU] via SUBCUTANEOUS
  Administered 2016-12-01 (×3): 4 [IU] via SUBCUTANEOUS
  Administered 2016-12-01: 6 [IU] via SUBCUTANEOUS
  Administered 2016-12-01: 4 [IU] via SUBCUTANEOUS

## 2016-11-27 MED ORDER — LORAZEPAM 2 MG/ML IJ SOLN: 1.0000 mg | Freq: Once | INTRAMUSCULAR | Status: DC

## 2016-11-27 MED ORDER — PIPERACILLIN-TAZOBACTAM 3.375 G IVPB
3.3750 g | Freq: Three times a day (TID) | INTRAVENOUS | Status: DC
  Administered 2016-11-28 – 2016-11-29 (×5): 3.375 g via INTRAVENOUS
  Filled 2016-11-27 (×6): qty 50

## 2016-11-27 MED ORDER — HYDRALAZINE HCL 20 MG/ML IJ SOLN: 10.0000 mg | Freq: Once | INTRAMUSCULAR | Status: DC

## 2016-11-27 MED ORDER — SODIUM CHLORIDE 0.9 % IV SOLN
30.0000 meq | INTRAVENOUS | Status: AC
Start: 2016-11-27 — End: 2016-11-28
  Administered 2016-11-27 (×2): 30 meq via INTRAVENOUS
  Filled 2016-11-27 (×3): qty 15

## 2016-11-27 MED ORDER — INSULIN GLARGINE 100 UNITS/ML SOLOSTAR PEN: 20.0000 [IU] | PEN_INJECTOR | Freq: Every day | SUBCUTANEOUS | Status: DC

## 2016-11-27 MED ORDER — SODIUM CHLORIDE 0.9 % IV SOLN
500.0000 mg | Freq: Two times a day (BID) | INTRAVENOUS | Status: DC
  Administered 2016-11-27: 500 mg via INTRAVENOUS
  Filled 2016-11-27 (×3): qty 5

## 2016-11-27 MED ORDER — ORAL CARE MOUTH RINSE: 15.0000 mL | Freq: Two times a day (BID) | OROMUCOSAL | Status: DC

## 2016-11-27 MED ORDER — ALLOPURINOL 300 MG PO TABS: 300.0000 mg | ORAL_TABLET | Freq: Every day | ORAL | Status: DC

## 2016-11-27 MED ORDER — DEXTROSE 5 % IV SOLN
1.0000 g | Freq: Once | INTRAVENOUS | Status: AC
  Administered 2016-11-27: 1 g via INTRAVENOUS
  Filled 2016-11-27: qty 10

## 2016-11-27 MED ORDER — LORAZEPAM 2 MG/ML IJ SOLN
1.0000 mg | INTRAMUSCULAR | Status: DC | PRN
  Administered 2016-11-27: 2 mg via INTRAVENOUS
  Filled 2016-11-27 (×2): qty 1

## 2016-11-27 MED ORDER — BUPROPION HCL ER (SR) 150 MG PO TB12
150.0000 mg | ORAL_TABLET | Freq: Two times a day (BID) | ORAL | Status: DC
  Filled 2016-11-27: qty 1

## 2016-11-27 MED ORDER — SODIUM CHLORIDE 0.9 % IV BOLUS (SEPSIS)
1000.0000 mL | Freq: Once | INTRAVENOUS | Status: AC
  Administered 2016-11-27: 1000 mL via INTRAVENOUS

## 2016-11-27 MED ORDER — LEVALBUTEROL TARTRATE 45 MCG/ACT IN AERO: 1.0000 | INHALATION_SPRAY | RESPIRATORY_TRACT | Status: DC | PRN

## 2016-11-27 MED ORDER — HYDROCODONE-ACETAMINOPHEN 5-325 MG PO TABS: 1.0000 | ORAL_TABLET | ORAL | Status: DC | PRN

## 2016-11-27 MED ORDER — ONDANSETRON HCL 4 MG/2ML IJ SOLN: 4.0000 mg | Freq: Four times a day (QID) | INTRAMUSCULAR | Status: DC | PRN

## 2016-11-27 MED ORDER — METOCLOPRAMIDE HCL 5 MG/ML IJ SOLN
10.0000 mg | Freq: Once | INTRAMUSCULAR | Status: AC
Start: 2016-11-27 — End: 2016-11-27
  Administered 2016-11-27: 10 mg via INTRAVENOUS
  Filled 2016-11-27: qty 2

## 2016-11-27 MED ORDER — FUROSEMIDE 20 MG PO TABS: 80.0000 mg | ORAL_TABLET | Freq: Every day | ORAL | Status: DC

## 2016-11-27 MED ORDER — VANCOMYCIN HCL 10 G IV SOLR
2000.0000 mg | Freq: Once | INTRAVENOUS | Status: AC
  Administered 2016-11-27: 2000 mg via INTRAVENOUS
  Filled 2016-11-27 (×2): qty 2000

## 2016-11-27 MED ORDER — CARVEDILOL 12.5 MG PO TABS: 12.5000 mg | ORAL_TABLET | Freq: Two times a day (BID) | ORAL | Status: DC

## 2016-11-27 MED ORDER — NICARDIPINE HCL IN NACL 20-0.86 MG/200ML-% IV SOLN: 3.0000 mg/h | INTRAVENOUS | Status: DC

## 2016-11-27 MED ORDER — MIDAZOLAM HCL 2 MG/2ML IJ SOLN
INTRAMUSCULAR | Status: AC
  Filled 2016-11-27: qty 2

## 2016-11-27 MED ORDER — VANCOMYCIN HCL 10 G IV SOLR
1250.0000 mg | Freq: Two times a day (BID) | INTRAVENOUS | Status: DC
  Administered 2016-11-28: 1250 mg via INTRAVENOUS
  Filled 2016-11-27 (×2): qty 1250

## 2016-11-27 MED ORDER — METOPROLOL TARTRATE 5 MG/5ML IV SOLN
5.0000 mg | INTRAVENOUS | Status: DC | PRN
  Administered 2016-12-01: 5 mg via INTRAVENOUS
  Filled 2016-11-27: qty 5

## 2016-11-27 MED ORDER — LORAZEPAM 2 MG/ML IJ SOLN
1.0000 mg | Freq: Once | INTRAMUSCULAR | Status: AC
  Administered 2016-11-27: 1 mg via INTRAVENOUS

## 2016-11-27 MED ORDER — POLYETHYLENE GLYCOL 3350 17 G PO PACK: 17.0000 g | PACK | Freq: Every day | ORAL | Status: DC | PRN

## 2016-11-27 NOTE — H&P (Signed)
History and Physical    Jeremiah Perkins EUM:[id number] DOB: 10-08-1961 DOA: 12/10/2016  PCP: [name], DO Patient coming from:   Chief Complaint: right leg numbness and weakness  HPI: Jeremiah Perkins is a 56 y.o. male with medical history significant of CHF, Afib on Xarelto anticoagulation therapy,  DM type II, CKD, HTN who was recently hospitalized (10/26/11/9) for sepsis associated with UTI presented to the ED with c/o multiple falls during the last two weeks. He remember falling backward and hitting his head at his friend's house. He lost consciousness, was evaluated by EMS at the scene, but has not been transported to the hospital. Few days later he fell out of bed at home. Since then he has been having headaches that are steady and moderate in intensity. Another fall was this morning when he was on the way to the bathroom and fell like his right leg was weak and gave out. He also c/o numbness and weakness of the right leg. On presentation to the ED he was also c/o abdominal pain, but denied nausea and vomiting or flu like symptoms.  ED Course: On presentation to the ED he was febrile with temp of 100.61F, patient was hypotensive with blood pressure of 86/80 9, oxygen saturation dropped to 89% and he was bradycardic with heart rate 46 . CT revealed acute interhemispheric subdural hematoma with additional small amount of subdural blood along the left tentorium and along the anterior and posterior interhemispheric fissure, nor midline shift. Chest x-ray didn't reveal any acute findings. Blood work showed hypokalemia with potassium 3.1, normal white blood cells count, normal yearly urinalysis, but elevated lactic acid of 2.25--> 2.14 EKG showed A. Fib with controlled heart rate, prolonged QTC, occasional PVCs  Patient was given fresh frozen plasma. He was seen by neurosurgery in the emergency department and they preferred to manage his condition conservatively  While in the ED patient's  condition changed, he became agitated, developed involutary twitching of the right upper and lower extremities, BP started rising - 167/118 =--> 178/114 mmHg. Patient was c/o of pain and lost strength of the right leg Hydralazine IV x 1 was administered, repeat head CT stat ordered Nicardipine drip initiated and patient will be admitted to ICU  Review of Systems: As per HPI otherwise 10 point review of systems negative.   Ambulatory Status: Independent  Past Medical History:  Diagnosis Date  . Anxiety   . Arthritis    involving knees, ankles, back, elbows (01/02/2015)  . Asthma   . Atrial flutter ([place])   . Cholelithiasis   . Chronic back pain   . Chronic bronchitis ([place])    "get it q yr" (01/02/2015)  . Congestive heart failure ([place])    No echo data available. Myocardial perfusion 2005 with EF 61%, presumed diastolic HF   . COPD (chronic obstructive pulmonary disease) ([place])   . Diabetic peripheral neuropathy ([place])   . Diverticulosis    with history of diverticulitis in 10/2011  . GERD (gastroesophageal reflux disease)   . Gout   . Hyperlipidemia   . Hypertension   . Major depression   . Morbid obesity ([place])   . Myocardial infarction    "one doctor said I did back in 2014"  . OSA (obstructive sleep apnea) 1998   "wear my CPAP a couple times/month" (01/02/2015)  . Pneumonia 2013; 2014  . Psychosexual dysfunction with inhibited sexual excitement   . Shortness of breath    "all the time" (07/26/2013)  . Type  II diabetes mellitus ([place]) dx'd 1991   previously on insulin in 2000 for 3-4 years (but was stopped because adamantly did not want to be on insulin)    Past Surgical History:  Procedure Laterality Date  . 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 ANKLE RECONSTRUCTION Right 1990's   2/2 trauma sustained after fall  . CARDIAC CATHETERIZATION  1990's; 07/2013  . CARDIOVERSION N/A 07/28/2013   Procedure: CARDIOVERSION;  Surgeon: Larey Dresser, MD;  [facility] END[facility]COPY;   Service: Cardiovascular;  Laterality: N/A;  . CARDIOVERSION N/A 10/26/2013   Procedure: CARDIOVERSION;  Surgeon: [name], MD;  Location: [place];  Service: Cardiovascular;  Laterality: N/A;  . CHOLECYSTECTOMY N/A 08/24/2015   Procedure: LAPAROSCOPIC CHOLECYSTECTOMY WITH INTRAOPERATIVE CHOLANGIOGRAM;  Surgeon: Jackolyn Confer, MD;  Location: [facility] ORS;  Service: General;  Laterality: N/A;  . CYST REMOVAL LEG Left 1960's   back of  leg  . FINGER AMPUTATION    . LEFT HEART CATHETERIZATION WITH CORONARY ANGIOGRAM N/A 07/27/2013   Procedure: LEFT HEART CATHETERIZATION WITH CORONARY ANGIOGRAM;  Surgeon: Peter M Martinique, MD;  Location: [facility] CATH LAB;  Service: Cardiovascular;  Laterality: N/A;  . TEE WITHOUT CARDIOVERSION N/A 07/28/2013   Procedure: TRANSESOPHAGEAL ECHOCARDIOGRAM (TEE);  Surgeon: Larey Dresser, MD;  Location: [facility] ENDOSCOPY;  Service: Cardiovascular;  Laterality: N/A;    Social History   Social History  . Marital status: Married    Spouse name: N/A  . Number of children: 3  . Years of education: 14   Occupational History  . Now unemployed     Conservation officer, nature at [employer]  .  [employer]   Social History Main Topics  . Smoking status: Former Smoker    Packs/day: 0.12    Years: 10.00    Types: Cigarettes    Quit date: 09/13/2015  . Smokeless tobacco: Never Used     Comment: rarely.  . Alcohol use 0.0 oz/week     Comment: OCCASIONAL  . Drug use: No     Comment: LAST USED DEC 2015  . Sexual activity: Yes   Other Topics Concern  . Not on file   Social History Narrative   Previously worked as Conservation officer, nature and lives at home with his wife. They each have children but none between the two of them.      Diet:      Do you drink/ eat things with caffeine?  Yes      Marital status:   Married                             What year were you married ? 2010      Do you live in a house, apartment,assistred living, condo, trailer, etc.)? House      Is it one or more  stories? Yes      How many persons live in your home ? 3      Do you have any pets in your home ?(please list) Dog      Current or past profession: Aircraft Mechanic/Instructor      Do you exercise?  Yes                            Type & how often:  Golf,cut grass, walk      Do you have a living will? No      Do you have a  DNR form?   No                    If not, do you want to discuss one?       Do you have signed POA?HPOA forms?   No              If so, please bring to your        appointment          No Known Allergies  Family History  Problem Relation Age of Onset  . Diabetes Mother   . CAD Mother 4    requiring quadruple bypass  . Hypertension Mother   . Alzheimer's disease Mother   . Stomach cancer Father   . Lung cancer Father     was a smoker  . Gout Father   . CAD Brother 48    requiring quadruple bypass  . Seizures Brother   . Hypertension Brother   . Diabetes Maternal Grandmother   . Alzheimer's disease Maternal Grandmother   . Breast cancer Paternal Aunt   . Diabetes Maternal Aunt   . Seizures Paternal Uncle     Prior to Admission medications   Medication Sig Start Date End Date Taking? Authorizing Provider  allopurinol (ZYLOPRIM) 300 MG tablet Take 300 mg by mouth daily. 10/20/16   Historical Provider, MD  Blood Glucose Monitoring Suppl (ACCU-CHEK AVIVA PLUS) w/Device KIT Use glucose meter to check blood sugars 4 times daily. 10/23/16   Lauree Chandler, NP  budesonide (PULMICORT) 0.5 MG/2ML nebulizer solution Take 4 mLs (1 mg total) by nebulization 2 (two) times daily. 12/25/15   [name], DO  buPROPion (WELLBUTRIN SR) 150 MG 12 hr tablet Take 1 tablet (150 mg total) by mouth 2 (two) times daily. 04/04/16   [name], DO  carvedilol (COREG) 12.5 MG tablet Take 1 tablet (12.5 mg total) by mouth 2 (two) times daily with a meal. 03/27/16   Thayer Headings, MD  colchicine 0.6 MG tablet Take 1 tablet (0.6 mg total) by mouth daily. 11/01/16    Rich Fuchs Choi, DO  diclofenac sodium (VOLTAREN) 1 % GEL Apply 2 g topically 4 (four) times daily. 05/30/16   [name], DO  fluticasone [facility]) 50 MCG/ACT nasal spray Use one spray in each nostril daily for seasonal allergy 04/22/16   [name], DO  furosemide (LASIX) 80 MG tablet Take 1 tablet (80 mg total) by mouth daily. 11/25/16   [name], DO  gabapentin (NEURONTIN) 600 MG tablet TAKE ONE TABLET BY MOUTH EVERY MORNING AND TAKE TWO TABLETS BY MOUTH EVERY EVENING 11/10/16   [name], DO  glucose blood (ACCU-CHEK AVIVA PLUS) test strip Use to check blood sugar four times daily. Dx: E11.8 10/23/16   Lauree Chandler, NP  insulin glargine (LANTUS) 100 unit/mL SOPN Inject 0.2 mLs (20 Units total) into the skin at bedtime. 11/01/16 12/01/16  Jennifer Chahn-Yang Choi, DO  Insulin Pen Needle 31G X 8 MM MISC Use daily with the administration of lantus E11.8 04/04/16   [name], DO  ipratropium (ATROVENT) 0.02 % nebulizer solution Take 2.5 mLs (0.5 mg total) by nebulization 2 (two) times daily. 12/25/15   [name], DO  levalbuterol [facility] HFA) 45 MCG/ACT inhaler Inhale 1-2 puffs into the lungs every 4 (four) hours as needed for wheezing. Reported on 12/13/2015 09/30/16   Lauree Chandler, NP  levalbuterol [facility]) 0.63 MG/3ML nebulizer solution Take 3 mLs (0.63 mg total) by nebulization every  4 (four) hours as needed for wheezing. Patient taking differently: Take 0.63 mg by nebulization 2 (two) times daily as needed for wheezing or shortness of breath.  12/25/15   [name], DO  losartan (COZAAR) 25 MG tablet Take 0.5 tablets (12.5 mg total) by mouth daily. Reported on 12/13/2015 06/16/16   Thayer Headings, MD  metolazone (ZAROXOLYN) 2.5 MG tablet Take 1 tablet (2.5 mg total) by mouth See admin instructions. Take 1 tablet (2.5 mg) by mouth up to twice a week as needed for weight gain of 7 lbs in 24 hours 11/01/16   Shon Millet, DO  oxyCODONE (OXY IR/ROXICODONE)  5 MG immediate release tablet Take 1-2 tablets (5-10 mg total) by mouth every 4 (four) hours as needed for moderate pain, severe pain or breakthrough pain. 10/08/16   Estill Dooms, MD  potassium chloride SA (K-DUR,KLOR-CON) 20 MEQ tablet Take 1 tablet (20 mEq total) by mouth daily. 11/01/16   Shon Millet, DO  PRESCRIPTION MEDICATION Inhale into the lungs See admin instructions. Use CPAP whenever sleeping    Historical Provider, MD  sildenafil (VIAGRA) 100 MG tablet Take 1 tablet (100 mg total) by mouth daily as needed for erectile dysfunction. Reported on 12/13/2015 06/10/16   Thayer Headings, MD  XARELTO 20 MG TABS tablet TAKE 1 TABLET (20 MG TOTAL) BY MOUTH DAILY WITH SUPPER. 01/04/16   [name], MD    Physical Exam: Vitals:   12/09/2016 0603 12/10/2016 0630 11/26/2016 0715 12/10/2016 0836  BP: 131/85 94/83 (!) 86/67 98/71  Pulse: 110 105 (!) 46 102  Resp: '24 26 26 23  ' Temp: 99.9 F (37.7 C) 98.4 F (36.9 C)  98.1 F (36.7 C)  TempSrc: Oral Oral  Axillary  SpO2: 100% 92% (!) 89% 97%  Weight:      Height:         General: Appears calm and comfortable Eyes: PERRLA, EOMI, normal lids, iris ENT:  grossly normal hearing, lips & tongue, mucous membranes moist and intact Neck: no lymphoadenopathy, masses or thyromegaly Cardiovascular: RRR, no m/r/g. No JVD, carotid bruits. No LE edema.  Respiratory: bilateral no wheezes, rales, rhonchi or cracles. Normal respiratory effort. No accessory muscle use observed Abdomen: soft, non-tender, non-distended, no organomegaly or masses appreciated. BS present in all quadrants Skin: no rash, ulcers or induration seen on limited exam Musculoskeletal: grossly normal tone BUE/BLE, good ROM, no bony abnormality or joint deformities observed Psychiatric: grossly normal mood and affect, speech fluent and appropriate, alert and oriented x3 Neurologic: CN II-XII grossly intact, muscle strength of the right LE decreased, but moves all extremities in  coordinated fashion, sensation intact  Labs on Admission: I have personally reviewed following labs and imaging studies  CBC, BMP  GFR: Estimated Creatinine Clearance: 95.4 mL/min (by C-G formula based on SCr of 1.06 mg/dL).   Creatinine Clearance: Estimated Creatinine Clearance: 95.4 mL/min (by C-G formula based on SCr of 1.06 mg/dL).    Radiological Exams on Admission: Ct Head Wo Contrast  Result Date: 12/19/2016 CLINICAL DATA:  Fall EXAM: CT HEAD WITHOUT CONTRAST TECHNIQUE: Contiguous axial images were obtained from the base of the skull through the vertex without intravenous contrast. COMPARISON:  None. FINDINGS: Brain: No acute territorial infarction is visualized. Moderate inter hemispheric subdural hematoma which is seen along the falx, with left-sided predominance, this measures 19 mm in maximum thickness on axial images. Mild hypodensity within the brain adjacent to the hematoma likely reflects mild edema. Subdural hematoma extends along the anterior  interhemispheric fissure. Mild asymmetric density along the left tentorium would also be consistent with small tentorial blood. There is no midline shift. Mild increased density paralleling the subdural hematoma could reflect a small amount of subarachnoid blood versus small contusion. Ventricles are nonenlarged. Vascular: No hyperdense vessels. There are carotid artery calcifications. Vertebral artery calcifications Skull: The mastoid air cells are clear. There is no skull fracture visualized. Sinuses/Orbits: Mucosal thickening within the maxillary and ethmoid sinuses. No acute orbital abnormality. Other: None IMPRESSION: Acute inter hemispheric subdural hematoma along the left aspect of the falx measuring 1.9 cm in maximum thickness with surrounding mild edema. No midline shift. Additional small amount of subdural blood is present along the left tentorium and along the anterior and posterior interhemispheric fissure. Mild hemorrhagic product  paralleling the subdural hematoma may reflect a small amount of subarachnoid blood within adjacent sulci versus mild contusion. Critical Value/emergent results were called by telephone at the time of interpretation on 11/25/2016 at 4:00 am to Dr. Varney Biles , who verbally acknowledged these results. Electronically Signed   By: Donavan Foil M.D.   On: 11/26/2016 04:00   Dg Chest Portable 1 View  Result Date: 11/24/2016 CLINICAL DATA:  Cough. EXAM: PORTABLE CHEST 1 VIEW COMPARISON:  10/25/2016 FINDINGS: A single AP portable view of the chest demonstrates no focal airspace consolidation or alveolar edema. The lungs are grossly clear. There is no large effusion or pneumothorax. There is unchanged cardiomegaly. Cardiac and mediastinal contours are otherwise unremarkable. IMPRESSION: Stable cardiomegaly.  No acute findings. Electronically Signed   By: Andreas Newport M.D.   On: 12/18/2016 04:30    EKG: Independently reviewed - A. Fib with controlled heart rate, prolonged QTC, occasional PVCs   Assessment/Plan Principal Problem:   Subdural hematoma (HCC) Active Problems:   Chronic systolic heart failure (HCC)   Fever in adult   Diabetes mellitus type 2, controlled, with complications (HCC)   Atrial flutter ([place])  Subdural hematoma associated with fall complicated by partial seizure Patient was seen by neurosurgical service and they believe his symptoms are related to hematoma  Plan is to manage it conservatively unless condition worsens Patient will have a repeat CT now to reevaluate the extend of bleeding Continue to monitor symptoms Fresh frozen plasma transfusion I si n process, hold anticoagulation therapy PT consult ordered  Fever with unknown source of infection Lactic acid elevated at 2.25--> 2.14 after initial hydration; continue IV fluids and monitor lactic acid, check pro-calcitonin Sepsis workup initiated given elevated lactic acid, hypotension and recent admission for  urosepsis Blood cultures and urine culture ordered Continue IV hydration Continue to monitor white blood cells count and down fever  A. Flutter/fib with controlled heart rate Xarelto is on hold Continue home medications for rate control  Chronic CHF without overt exacerbation Last echocardiogram in January 2017 showed LVEF 35-40% with regional wall motion abnormalities,akinesis of the anteroseptal and apical myocardium Continue to monitor fluid volume status as patient on IV hydration Hold diuretic therapy  DM type II - most recent HgbA1C is 8.5% on 10/26/2016 Continue home doses of Insulin, diabetic diet, monitor FSBS QACHS, add sliding scale insulin    DVT prophylaxis: none Code Status: full Family Communication: none Disposition Plan: step down Consults called: neurosurgery consulted by ED provider Admission status: inpatient   York Grice, [place] Pager: [phone] [facility]  If 7PM-7AM, please contact night-coverage [email or web address] Password [email or web address]  12/24/2016, 8:42 AM

## 2016-11-27 NOTE — ED Notes (Signed)
EDP at bedside  

## 2016-11-27 NOTE — Consult Note (Signed)
Reason for Consult:SDH Referring Physician: EDP  Jeremiah Perkins is an 56 y.o. male.   HPI:  56 year old male with multiple medical problems who is seen in the emergency department after multiple falls over the last 2 weeks. First fall was tender 14 days ago after going to a friend'Perkins house" having a couple of drinks and having them go to my head." He fell backwards and hit his head and had a loss of consciousness. He was evaluated by EMS but not transported to the hospital. He fell again 5-6 days ago, and this time he fell out of bed. No loss of consciousness. He has had a headache since that time. He fell again earlier today trying to go to the bathroom when his right leg gave out. He complains of a headache. He complains of right leg numbness and weakness. Pain is moderate. Pain is not progressive. No visual changes or nausea and vomiting. He has had abdominal pain. He presents with low-grade fever also. No cough or runny nose or dysuria. Recent UTI. He is on Xarelto for atrial fibrillation. EDP has decided to test for flu and has started a sepsis workup because of fever and tachycardia.  Past Medical History:  Diagnosis Date  . Anxiety   . Arthritis    involving knees, ankles, back, elbows (01/02/2015)  . Asthma   . Atrial flutter (HCC)   . Cholelithiasis   . Chronic back pain   . Chronic bronchitis (HCC)    "get it q yr" (01/02/2015)  . Congestive heart failure (HCC)    No echo data available. Myocardial perfusion 2005 with EF 61%, presumed diastolic HF   . COPD (chronic obstructive pulmonary disease) (HCC)   . Diabetic peripheral neuropathy (HCC)   . Diverticulosis    with history of diverticulitis in 10/2011  . GERD (gastroesophageal reflux disease)   . Gout   . Hyperlipidemia   . Hypertension   . Major depression   . Morbid obesity (HCC)   . Myocardial infarction    "one doctor said I did back in 2014"  . OSA (obstructive sleep apnea) 1998   "wear my CPAP a couple times/month"  (01/02/2015)  . Pneumonia 2013; 2014  . Psychosexual dysfunction with inhibited sexual excitement   . Shortness of breath    "all the time" (07/26/2013)  . Type II diabetes mellitus (HCC) dx'd 1991   previously on insulin in 2000 for 3-4 years (but was stopped because adamantly did not want to be on insulin)    Past Surgical History:  Procedure Laterality Date  . 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 ANKLE RECONSTRUCTION Right 1990'Perkins   2/2 trauma sustained after fall  . CARDIAC CATHETERIZATION  1990'Perkins; 07/2013  . CARDIOVERSION N/A 07/28/2013   Procedure: CARDIOVERSION;  Surgeon: Laurey Morale, MD;  Location: [facility] ENDOSCOPY;  Service: Cardiovascular;  Laterality: N/A;  . CARDIOVERSION N/A 10/26/2013   Procedure: CARDIOVERSION;  Surgeon: Cassell Clement, MD;  Location: [facility] ENDOSCOPY;  Service: Cardiovascular;  Laterality: N/A;  . CHOLECYSTECTOMY N/A 08/24/2015   Procedure: LAPAROSCOPIC CHOLECYSTECTOMY WITH INTRAOPERATIVE CHOLANGIOGRAM;  Surgeon: Avel Peace, MD;  Location: [facility] ORS;  Service: General;  Laterality: N/A;  . CYST REMOVAL LEG Left 1960'Perkins   back of  leg  . FINGER AMPUTATION    . LEFT HEART CATHETERIZATION WITH CORONARY ANGIOGRAM N/A 07/27/2013   Procedure: LEFT HEART CATHETERIZATION WITH CORONARY ANGIOGRAM;  Surgeon: Peter M Swaziland, MD;  Location: [facility] CATH LAB;  Service: Cardiovascular;  Laterality: N/A;  . TEE WITHOUT CARDIOVERSION N/A 07/28/2013   Procedure: TRANSESOPHAGEAL ECHOCARDIOGRAM (TEE);  Surgeon: Laurey Morale, MD;  Location: [facility] ENDOSCOPY;  Service: Cardiovascular;  Laterality: N/A;    No Known Allergies  Social History  Substance Use Topics  . Smoking status: Former Smoker    Packs/day: 0.12    Years: 10.00    Types: Cigarettes    Quit date: 09/13/2015  . Smokeless tobacco: Never Used     Comment: rarely.  . Alcohol use 0.0 oz/week     Comment: OCCASIONAL    Family History  Problem Relation Age of Onset  . Diabetes Mother   . CAD Mother 48    requiring  quadruple bypass  . Hypertension Mother   . Alzheimer'Perkins disease Mother   . Stomach cancer Father   . Lung cancer Father     was a smoker  . Gout Father   . CAD Brother 48    requiring quadruple bypass  . Seizures Brother   . Hypertension Brother   . Diabetes Maternal Grandmother   . Alzheimer'Perkins disease Maternal Grandmother   . Breast cancer Paternal Aunt   . Diabetes Maternal Aunt   . Seizures Paternal Uncle      Review of Systems  Positive ROS: neg except for the above  All other systems have been reviewed and were otherwise negative with the exception of those mentioned in the HPI and as above.  Objective: Vital signs in last 24 hours: Temp:  [100.2 F (37.9 C)] 100.2 F (37.9 C) (01/04 0211) Pulse Rate:  [101-117] 101 (01/04 0400) Resp:  [22-33] 23 (01/04 0400) BP: (94-117)/(61-85) 109/67 (01/04 0400) SpO2:  [90 %-97 %] 97 % (01/04 0400) Weight:  [120.2 kg (265 lb)] 120.2 kg (265 lb) (01/04 0212)  General Appearance: Alert, cooperative, no distress, appears stated age Head: Normocephalic, without obvious abnormality, atraumatic Eyes: PERRL, conjunctiva/corneas clear      Neck: Supple Lungs: respirations unlabored Heart: Irregularly irregular rhythm with slight tachycardia Abdomen: Soft, obese   NEUROLOGIC:   Mental status: A&O x4, no aphasia, good attention span, Memory and fund of knowledge appear to be appropriate Motor Exam - seems to have good strength on the left but is mildly weak on the right worsen his leg than his arm Sensory Exam - grossly normal Reflexes: Decreased Coordination - grossly normal Gait - not tested Balance - not tested Cranial Nerves: I: smell Not tested  II: visual acuity  OS: na    OD: na  II: visual fields Full to confrontation  II: pupils Equal, round, reactive to light  III,VII: ptosis None  III,IV,VI: extraocular muscles  Full ROM  V: mastication Normal  V: facial light touch sensation  Normal  V,VII: corneal reflex   Present  VII: facial muscle function - upper  Normal  VII: facial muscle function - lower Normal  VIII: hearing Not tested  IX: soft palate elevation  Normal  IX,X: gag reflex Present  XI: trapezius strength  5/5  XI: sternocleidomastoid strength 5/5  XI: neck flexion strength  5/5  XII: tongue strength  Normal    Data Review Lab Results  Component Value Date   WBC 9.6 12/11/2016   HGB 13.5 12/03/2016   HCT 41.3 12/23/2016   MCV 91.2 12/10/2016   PLT 168 11/30/2016   Lab Results  Component Value Date   NA 139 11/05/2016   K 4.0 11/05/2016   CL 99 11/05/2016   CO2 29 11/05/2016  BUN 30 (H) 11/05/2016   CREATININE 1.31 11/05/2016   GLUCOSE 122 (H) 11/05/2016   Lab Results  Component Value Date   INR 1.80 12/12/2016    Radiology: Ct Head Wo Contrast  Result Date: 12/07/2016 CLINICAL DATA:  Fall EXAM: CT HEAD WITHOUT CONTRAST TECHNIQUE: Contiguous axial images were obtained from the base of the skull through the vertex without intravenous contrast. COMPARISON:  None. FINDINGS: Brain: No acute territorial infarction is visualized. Moderate inter hemispheric subdural hematoma which is seen along the falx, with left-sided predominance, this measures 19 mm in maximum thickness on axial images. Mild hypodensity within the brain adjacent to the hematoma likely reflects mild edema. Subdural hematoma extends along the anterior interhemispheric fissure. Mild asymmetric density along the left tentorium would also be consistent with small tentorial blood. There is no midline shift. Mild increased density paralleling the subdural hematoma could reflect a small amount of subarachnoid blood versus small contusion. Ventricles are nonenlarged. Vascular: No hyperdense vessels. There are carotid artery calcifications. Vertebral artery calcifications Skull: The mastoid air cells are clear. There is no skull fracture visualized. Sinuses/Orbits: Mucosal thickening within the maxillary and ethmoid  sinuses. No acute orbital abnormality. Other: None IMPRESSION: Acute inter hemispheric subdural hematoma along the left aspect of the falx measuring 1.9 cm in maximum thickness with surrounding mild edema. No midline shift. Additional small amount of subdural blood is present along the left tentorium and along the anterior and posterior interhemispheric fissure. Mild hemorrhagic product paralleling the subdural hematoma may reflect a small amount of subarachnoid blood within adjacent sulci versus mild contusion. Critical Value/emergent results were called by telephone at the time of interpretation on 12/18/2016 at 4:00 am to Dr. Derwood Kaplan , who verbally acknowledged these results. Electronically Signed   By: Jasmine Pang M.D.   On: 12/09/2016 04:00   Dg Chest Portable 1 View  Result Date: 12/23/2016 CLINICAL DATA:  Cough. EXAM: PORTABLE CHEST 1 VIEW COMPARISON:  10/25/2016 FINDINGS: A single AP portable view of the chest demonstrates no focal airspace consolidation or alveolar edema. The lungs are grossly clear. There is no large effusion or pneumothorax. There is unchanged cardiomegaly. Cardiac and mediastinal contours are otherwise unremarkable. IMPRESSION: Stable cardiomegaly.  No acute findings. Electronically Signed   By: Ellery Plunk M.D.   On: 12/14/2016 04:30     Assessment/Plan: 1. SDH - this is a moderate interhemispheric subdural hematoma which rarely requires surgical intervention. I do think it probably led to the fall today because of weakness in the right leg. The location of the subdural hematoma matches this. I believe the subdural hematoma is likely caused from the fall (Perkins) prior to this and his headache has been there for 5-6 days.  - no surgery indicated at present -Recommend admission to the stepdown unit - Recommend repeat head CT tomorrow or if he has a change in neurologic status - Stop anticoagulant, EDP has ordered FFP - PT/OT  2. Given the patient'Perkins fever of  unknown origin, abdominal pain, sepsis workup, atrial fibrillation, multiple falls of unknown cause, gout requiring use of prednisone over the last week and multiple medical problems including diabetes COPD obesity atrial fibrillation I think he is best managed on the hospitalist service   Jeremiah Perkins 12/20/2016 5:01 AM

## 2016-11-27 NOTE — Progress Notes (Signed)
eLink Physician-Brief Progress Note Patient Name: Jeremiah Perkins DOB: [birth year] MRN: [id number]   Date of Service  12/21/2016  HPI/Events of Note  No clear evidence for infection besides risk for aspiration. Sepsis bundle was triggered by lactate 2.5 in setting seizures, cocaine. suspect that abx can be stopped. Will give the initial doses vanco and zosyn and then make clinical assess regarding risk infection and need to continue.   eICU Interventions       Intervention Category Major Interventions: Infection - evaluation and management  Melaina Howerton S. 12/11/2016, 4:27 PM

## 2016-11-27 NOTE — Progress Notes (Signed)
Pharmacy Antibiotic Note  Jeremiah Perkins is a 56 y.o. male admitted on 12/20/2016 with sepsis.  Pharmacy has been consulted for vancomycin and zosyn dosing.  Patient received vancomycin 2g and zosyn 3.375g IV once in the ED.  Plan: Vancomycin 1250mg  IV every 12 hours.  Goal trough 15-20 mcg/mL. Zosyn 3.375g IV q8h (4 hour infusion).  Monitor culture data, renal function and clinical course VT at SS prn  Height: 5\' 7"  (170.2 cm) Weight: 265 lb (120.2 kg) IBW/kg (Calculated) : 66.1  Temp (24hrs), Avg:99.2 F (37.3 C), Min:98.1 F (36.7 C), Max:100.2 F (37.9 C)   Recent Labs Lab 20-Dec-2016 0400 12-20-16 0419 20-Dec-2016 0635 12/20/2016 1153  WBC 9.6  --   --  12.1*  CREATININE 1.06  --   --   --   LATICACIDVEN  --  2.25* 2.14* 1.5    Estimated Creatinine Clearance: 95.4 mL/min (by C-G formula based on SCr of 1.06 mg/dL).    No Known Allergies   Arlean Hopping. Newman Pies, PharmD, BCPS Clinical Pharmacist Pager [phone] December 20, 2016 12:54 PM

## 2016-11-27 NOTE — ED Notes (Signed)
Attempted report to 3M 

## 2016-11-27 NOTE — ED Triage Notes (Signed)
Per EMS, pt reports that around noon yesterday pt started feeling right leg weakness and it started to get worse throughout the day. After pt went to bed, tried to get up to use the bathroom and leg was numb and pt fell to the ground and couldn't get up. Denies hitting head. Pt reports a week ago that he fell out of bed in the middle of the night and bumped his head on the wall but didn't call for transport. Pt reports left side of head has been swollen and painful. Pt denies any SOB. Pt normally mobile with no mobility aids. Pt also reports headache pressure x5 days. Hx of CHF and AFIB. Pt takes Xarelto and Lasix.

## 2016-11-27 NOTE — ED Provider Notes (Signed)
[facility] DEPT Provider Note   CSN: [id number] Arrival date & time: 12/07/2016  0037   By signing my name below, I, Delton Prairie, attest that this documentation has been prepared under the direction and in the presence of Varney Biles, MD  Electronically Signed: Delton Prairie, ED Scribe. 12/10/2016. 4:05 AM.   History   Chief Complaint Chief Complaint  Patient presents with  . Extremity Weakness  . Headache   The history is provided by the patient. No language interpreter was used.   HPI Comments:  Jeremiah Perkins is a 56 y.o. male, with a hx of DM, COPD, CHF and atrial flutter, who presents to the Emergency Department complaining of persistent, constant, frontal, posterior and bilateral temporal headaches x 5 days. Pt states he fell out of bed and hit his head on the floor 5 days ago and experienced LOC. Wife reports pt fell once again prior to the above fall. He also reports right lower extremity weakness x yesterday and tingling to his right upper extremity. Spouse also reports pt's urine was "sticky" today. He has had 1 hydrocodone with little relief. Pt denies new numbness/tingling, vision loss, dizziness, lightheadedness, discomfort with urination, any other associated symptoms and any other modifying factors at this time. Pt is on a blood thinner. Pt was last completely normal around 4 PM 2 days ago.    Past Medical History:  Diagnosis Date  . Anxiety   . Arthritis    involving knees, ankles, back, elbows (01/02/2015)  . Asthma   . Atrial flutter ([place])   . Cholelithiasis   . Chronic back pain   . Chronic bronchitis ([place])    "get it q yr" (01/02/2015)  . Congestive heart failure ([place])    No echo data available. Myocardial perfusion 2005 with EF 61%, presumed diastolic HF   . COPD (chronic obstructive pulmonary disease) ([place])   . Diabetic peripheral neuropathy ([place])   . Diverticulosis    with history of diverticulitis in 10/2011  . GERD (gastroesophageal reflux disease)     . Gout   . Hyperlipidemia   . Hypertension   . Major depression   . Morbid obesity ([place])   . Myocardial infarction    "one doctor said I did back in 2014"  . OSA (obstructive sleep apnea) 1998   "wear my CPAP a couple times/month" (01/02/2015)  . Pneumonia 2013; 2014  . Psychosexual dysfunction with inhibited sexual excitement   . Shortness of breath    "all the time" (07/26/2013)  . Type II diabetes mellitus ([place]) dx'd 1991   previously on insulin in 2000 for 3-4 years (but was stopped because adamantly did not want to be on insulin)    Patient Active Problem List   Diagnosis Date Noted  . Subdural hematoma ([place]) 12/01/2016  . Sepsis ([place]) 10/26/2016  . Pyelonephritis 10/25/2016  . Essential hypertension   . Acute on chronic systolic heart failure, NYHA class 3 ([place]) 12/13/2015  . Asthma, chronic 11/02/2015  . Primary gout 05/31/2015  . Calculus of gallbladder w/o mention of cholecystitis or obstruction 05/30/2015  . Subclinical hypothyroidism 01/03/2015  . Chronic kidney disease (CKD), stage III (moderate) 01/03/2015  . Lower back pain 02/27/2014  . Long term current use of anticoagulant therapy 07/22/2013  . Atrial flutter ([place]) 07/15/2013  . Morbid obesity ([place]) 11/11/2012  . Arthritis   . Major depression   . Diabetic peripheral neuropathy ([place])   . Psychosexual dysfunction with inhibited sexual excitement   . OSA (obstructive  sleep apnea)   . Chronic systolic heart failure ([place])   . Gout   . Diabetes mellitus type 2, controlled, with complications ([place]) 28/78/6767    Past Surgical History:  Procedure Laterality Date  . 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 ANKLE RECONSTRUCTION Right 1990's   2/2 trauma sustained after fall  . CARDIAC CATHETERIZATION  1990's; 07/2013  . CARDIOVERSION N/A 07/28/2013   Procedure: CARDIOVERSION;  Surgeon: Larey Dresser, MD;  Location: [facility] ENDOSCOPY;  Service: Cardiovascular;  Laterality: N/A;  . CARDIOVERSION N/A 10/26/2013    Procedure: CARDIOVERSION;  Surgeon: [name], MD;  Location: [facility];  Service: Cardiovascular;  Laterality: N/A;  . CHOLECYSTECTOMY N/A 08/24/2015   Procedure: LAPAROSCOPIC CHOLECYSTECTOMY WITH INTRAOPERATIVE CHOLANGIOGRAM;  Surgeon: Jackolyn Confer, MD;  Location: [facility] ORS;  Service: General;  Laterality: N/A;  . CYST REMOVAL LEG Left 1960's   back of  leg  . FINGER AMPUTATION    . LEFT HEART CATHETERIZATION WITH CORONARY ANGIOGRAM N/A 07/27/2013   Procedure: LEFT HEART CATHETERIZATION WITH CORONARY ANGIOGRAM;  Surgeon: Peter M Martinique, MD;  Location: [facility] CATH LAB;  Service: Cardiovascular;  Laterality: N/A;  . TEE WITHOUT CARDIOVERSION N/A 07/28/2013   Procedure: TRANSESOPHAGEAL ECHOCARDIOGRAM (TEE);  Surgeon: Larey Dresser, MD;  Location: [facility];  Service: Cardiovascular;  Laterality: N/A;       Home Medications    Prior to Admission medications   Medication Sig Start Date End Date Taking? Authorizing Provider  allopurinol (ZYLOPRIM) 300 MG tablet Take 300 mg by mouth daily. 10/20/16   Historical Provider, MD  Blood Glucose Monitoring Suppl (ACCU-CHEK AVIVA PLUS) w/Device KIT Use glucose meter to check blood sugars 4 times daily. 10/23/16   Lauree Chandler, NP  budesonide (PULMICORT) 0.5 MG/2ML nebulizer solution Take 4 mLs (1 mg total) by nebulization 2 (two) times daily. 12/25/15   [name], DO  buPROPion (WELLBUTRIN SR) 150 MG 12 hr tablet Take 1 tablet (150 mg total) by mouth 2 (two) times daily. 04/04/16   [name], DO  carvedilol (COREG) 12.5 MG tablet Take 1 tablet (12.5 mg total) by mouth 2 (two) times daily with a meal. 03/27/16   Thayer Headings, MD  colchicine 0.6 MG tablet Take 1 tablet (0.6 mg total) by mouth daily. 11/01/16   Rich Fuchs Choi, DO  diclofenac sodium (VOLTAREN) 1 % GEL Apply 2 g topically 4 (four) times daily. 05/30/16   [name], DO  fluticasone [facility]) 50 MCG/ACT nasal spray Use one spray in each nostril daily for seasonal  allergy 04/22/16   [name], DO  furosemide (LASIX) 80 MG tablet Take 1 tablet (80 mg total) by mouth daily. 11/25/16   [name], DO  gabapentin (NEURONTIN) 600 MG tablet TAKE ONE TABLET BY MOUTH EVERY MORNING AND TAKE TWO TABLETS BY MOUTH EVERY EVENING 11/10/16   [name], DO  glucose blood (ACCU-CHEK AVIVA PLUS) test strip Use to check blood sugar four times daily. Dx: E11.8 10/23/16   Lauree Chandler, NP  insulin glargine (LANTUS) 100 unit/mL SOPN Inject 0.2 mLs (20 Units total) into the skin at bedtime. 11/01/16 12/01/16  Jennifer Chahn-Yang Choi, DO  Insulin Pen Needle 31G X 8 MM MISC Use daily with the administration of lantus E11.8 04/04/16   [name], DO  ipratropium (ATROVENT) 0.02 % nebulizer solution Take 2.5 mLs (0.5 mg total) by nebulization 2 (two) times daily. 12/25/15   [name], DO  levalbuterol [facility] HFA) 45 MCG/ACT inhaler Inhale 1-2  puffs into the lungs every 4 (four) hours as needed for wheezing. Reported on 12/13/2015 09/30/16   Lauree Chandler, NP  levalbuterol [facility]) 0.63 MG/3ML nebulizer solution Take 3 mLs (0.63 mg total) by nebulization every 4 (four) hours as needed for wheezing. Patient taking differently: Take 0.63 mg by nebulization 2 (two) times daily as needed for wheezing or shortness of breath.  12/25/15   [name], DO  losartan (COZAAR) 25 MG tablet Take 0.5 tablets (12.5 mg total) by mouth daily. Reported on 12/13/2015 06/16/16   Thayer Headings, MD  metolazone (ZAROXOLYN) 2.5 MG tablet Take 1 tablet (2.5 mg total) by mouth See admin instructions. Take 1 tablet (2.5 mg) by mouth up to twice a week as needed for weight gain of 7 lbs in 24 hours 11/01/16   Shon Millet, DO  oxyCODONE (OXY IR/ROXICODONE) 5 MG immediate release tablet Take 1-2 tablets (5-10 mg total) by mouth every 4 (four) hours as needed for moderate pain, severe pain or breakthrough pain. 10/08/16   Estill Dooms, MD  potassium chloride SA (K-DUR,KLOR-CON) 20 MEQ  tablet Take 1 tablet (20 mEq total) by mouth daily. 11/01/16   Shon Millet, DO  PRESCRIPTION MEDICATION Inhale into the lungs See admin instructions. Use CPAP whenever sleeping    Historical Provider, MD  sildenafil (VIAGRA) 100 MG tablet Take 1 tablet (100 mg total) by mouth daily as needed for erectile dysfunction. Reported on 12/13/2015 06/10/16   Wonda Cheng Nahser, MD  XARELTO 20 MG TABS tablet TAKE 1 TABLET (20 MG TOTAL) BY MOUTH DAILY WITH SUPPER. 01/04/16   [name], MD    Family History Family History  Problem Relation Age of Onset  . Diabetes Mother   . CAD Mother 29    requiring quadruple bypass  . Hypertension Mother   . Alzheimer's disease Mother   . Stomach cancer Father   . Lung cancer Father     was a smoker  . Gout Father   . CAD Brother 26    requiring quadruple bypass  . Seizures Brother   . Hypertension Brother   . Diabetes Maternal Grandmother   . Alzheimer's disease Maternal Grandmother   . Breast cancer Paternal Aunt   . Diabetes Maternal Aunt   . Seizures Paternal Uncle     Social History Social History  Substance Use Topics  . Smoking status: Former Smoker    Packs/day: 0.12    Years: 10.00    Types: Cigarettes    Quit date: 09/13/2015  . Smokeless tobacco: Never Used     Comment: rarely.  . Alcohol use 0.0 oz/week     Comment: OCCASIONAL     Allergies   Patient has no known allergies.   Review of Systems Review of Systems  10 systems reviewed and all are negative for acute change except as noted in the HPI.  Physical Exam Updated Vital Signs BP 94/83   Pulse 105   Temp 98.4 F (36.9 C) (Oral)   Resp 26   Ht _0  (1.702 m)   Wt 265 lb (120.2 kg)   SpO2 92%   BMI 41.50 kg/m   Physical Exam  Constitutional: He is oriented to person, place, and time. He appears well-developed and well-nourished.  HENT:  Head: Normocephalic.  Eyes: EOM are normal. Pupils are equal, round, and reactive to light.  Neck: Normal  range of motion.  Cardiovascular: Regular rhythm.   tachycardia  Pulmonary/Chest: Effort normal and breath sounds  normal. No respiratory distress.  Abdominal: Soft. He exhibits no distension. There is tenderness. There is no rebound and no guarding.  Neurological: He is alert and oriented to person, place, and time.  Cranial nerve 2-12 intact. RUE: drift but stength is 3+/5 RLE: 1/5 Pt is having subjective numbness to right lower extremity.   Skin: Skin is warm and dry.  Psychiatric: He has a normal mood and affect. Judgment normal.  Nursing note and vitals reviewed.   ED Treatments / Results  DIAGNOSTIC STUDIES:  Oxygen Saturation is 94% on RA, normal by my interpretation.    COORDINATION OF CARE:  3:38 AM Discussed treatment plan with pt at bedside and pt agreed to plan.  Labs (all labs ordered are listed, but only abnormal results are displayed) Labs Reviewed  PROTIME-INR - Abnormal; Notable for the following:       Result Value   Prothrombin Time 21.2 (*)    All other components within normal limits  COMPREHENSIVE METABOLIC PANEL - Abnormal; Notable for the following:    Sodium 134 (*)    Potassium 3.1 (*)    Chloride 93 (*)    Glucose, Bld 228 (*)    BUN 35 (*)    Albumin 2.8 (*)    All other components within normal limits  CBC WITH DIFFERENTIAL/PLATELET - Abnormal; Notable for the following:    RDW 17.6 (*)    All other components within normal limits  URINALYSIS, ROUTINE W REFLEX MICROSCOPIC - Abnormal; Notable for the following:    Glucose, UA 50 (*)    All other components within normal limits  I-STAT CG4 LACTIC ACID, ED - Abnormal; Notable for the following:    Lactic Acid, Venous 2.25 (*)    All other components within normal limits  I-STAT CG4 LACTIC ACID, ED - Abnormal; Notable for the following:    Lactic Acid, Venous 2.14 (*)    All other components within normal limits  CULTURE, BLOOD (ROUTINE X 2)  CULTURE, BLOOD (ROUTINE X 2)  URINE CULTURE    INFLUENZA PANEL BY PCR (TYPE A & B, H1N1)  PREPARE FRESH FROZEN PLASMA  ABO/RH    EKG  EKG Interpretation  Date/Time:  Thursday November 27 2016 01:58:31 EST Ventricular Rate:  116 PR Interval:    QRS Duration: 86 QT Interval:  397 QTC Calculation: 559 R Axis:   33 Text Interpretation:  Atrial fibrillation Paired ventricular premature complexes Low voltage, extremity and precordial leads Prolonged QT interval No acute changes No significant change since last tracing Confirmed by Kathrynn Humble, MD, Thelma Comp [phone]) on 12/20/2016 4:25:20 AM       Radiology Ct Head Wo Contrast  Result Date: 11/28/2016 CLINICAL DATA:  Fall EXAM: CT HEAD WITHOUT CONTRAST TECHNIQUE: Contiguous axial images were obtained from the base of the skull through the vertex without intravenous contrast. COMPARISON:  None. FINDINGS: Brain: No acute territorial infarction is visualized. Moderate inter hemispheric subdural hematoma which is seen along the falx, with left-sided predominance, this measures 19 mm in maximum thickness on axial images. Mild hypodensity within the brain adjacent to the hematoma likely reflects mild edema. Subdural hematoma extends along the anterior interhemispheric fissure. Mild asymmetric density along the left tentorium would also be consistent with small tentorial blood. There is no midline shift. Mild increased density paralleling the subdural hematoma could reflect a small amount of subarachnoid blood versus small contusion. Ventricles are nonenlarged. Vascular: No hyperdense vessels. There are carotid artery calcifications. Vertebral artery calcifications Skull: The mastoid air  cells are clear. There is no skull fracture visualized. Sinuses/Orbits: Mucosal thickening within the maxillary and ethmoid sinuses. No acute orbital abnormality. Other: None IMPRESSION: Acute inter hemispheric subdural hematoma along the left aspect of the falx measuring 1.9 cm in maximum thickness with surrounding mild edema.  No midline shift. Additional small amount of subdural blood is present along the left tentorium and along the anterior and posterior interhemispheric fissure. Mild hemorrhagic product paralleling the subdural hematoma may reflect a small amount of subarachnoid blood within adjacent sulci versus mild contusion. Critical Value/emergent results were called by telephone at the time of interpretation on 12/23/2016 at 4:00 am to Dr. Varney Biles , who verbally acknowledged these results. Electronically Signed   By: Donavan Foil M.D.   On: 11/24/2016 04:00   Dg Chest Portable 1 View  Result Date: 11/29/2016 CLINICAL DATA:  Cough. EXAM: PORTABLE CHEST 1 VIEW COMPARISON:  10/25/2016 FINDINGS: A single AP portable view of the chest demonstrates no focal airspace consolidation or alveolar edema. The lungs are grossly clear. There is no large effusion or pneumothorax. There is unchanged cardiomegaly. Cardiac and mediastinal contours are otherwise unremarkable. IMPRESSION: Stable cardiomegaly.  No acute findings. Electronically Signed   By: Andreas Newport M.D.   On: 12/07/2016 04:30    Procedures Procedures (including critical care time)  CRITICAL CARE Performed by: Varney Biles   Total critical care time: 80 minutes  Critical care time was exclusive of separately billable procedures and treating other patients.  Critical care was necessary to treat or prevent imminent or life-threatening deterioration.  Critical care was time spent personally by me on the following activities: development of treatment plan with patient and/or surrogate as well as nursing, discussions with consultants, evaluation of patient's response to treatment, examination of patient, obtaining history from patient or surrogate, ordering and performing treatments and interventions, ordering and review of laboratory studies, ordering and review of radiographic studies, pulse oximetry and re-evaluation of patient's  condition.   Medications Ordered in ED Medications  cefTRIAXone (ROCEPHIN) 1 g in dextrose 5 % 50 mL IVPB (not administered)  metoCLOPramide (REGLAN) injection 10 mg (10 mg Intravenous Given 12/01/2016 0411)  sodium chloride 0.9 % bolus 1,000 mL (0 mLs Intravenous Stopped 12/06/2016 0511)  0.9 %  sodium chloride infusion (10 mL/hr Intravenous New Bag/Given 11/28/2016 0611)     Initial Impression / Assessment and Plan / ED Course  I have reviewed the triage vital signs and the nursing notes.  Pertinent labs & imaging results that were available during my care of the patient were reviewed by me and considered in my medical decision making (see chart for details).  Clinical Course as of Nov 27 648  Thu Nov 27, 2016  0507 Pt has a SDH. His BP is fine. Neurosurgery consulted. FFP given. Dr. Ronnald Ramp, Neurosurgery has seen the patient. He thinks the bleed is subacute, and to start FFP. Pt has a low grade fever and multiple medical problems, and therefore he is recommending stepdown bed.  Results from the ER workup discussed with the patient face to face and all questions answered to the best of my ability.   [AN]  0509 Fevers - lactate is 2.25. Only SIRS criteria is elevated HR and RR. We have screened for flu. UA is pending. Antibiotics only to be given if there is a source of infection, otherwise the temp could be even due to the brain bleed.  [AN]    Clinical Course User Index [AN] Varney Biles, MD  I personally performed the services described in this documentation, which was scribed in my presence. The recorded information has been reviewed and is accurate.  Pt comes in with cc of R sided weakness, fall. Pt has hx of afib and is on xarelto and is noted to have R sided weakness along with severe headache that is constant. Pt also has a low grade fever. No nuchal rigidity. ROS is neg for any specific source of infection - but pt does have some abd pain and has hx of uti.  Clinical concern is for  brain bleed due to fall and if CT is neg, then stroke.  Fevers - no specific source based on hx and exam. If there is a brain bleed-  ? Central cause.   Final Clinical Impressions(s) / ED Diagnoses   Final diagnoses:  Subdural hematoma (HCC)  SAH (subarachnoid hemorrhage) (HCC)  Fever, unspecified fever cause    New Prescriptions New Prescriptions   No medications on file    I personally performed the services described in this documentation, which was scribed in my presence. The recorded information has been reviewed and is accurate.     Varney Biles, MD 12/24/2016 [phone]

## 2016-11-27 NOTE — Consult Note (Signed)
PULMONARY / CRITICAL CARE MEDICINE   Name: Jeremiah Perkins MRN: [id number] DOB: 03-14-1961    ADMISSION DATE:  12/24/2016 CONSULTATION DATE:  11/29/2016  REFERRING MD:  Dr. Posey Pronto   CHIEF COMPLAINT: SDH   HISTORY OF PRESENT ILLNESS:   56 year old male with PMH of DM, COPD, CHF, OSA (on CPAP at HS), atrial flutter (on xarelto), and polysubstance abuse (patient report used cocaine last night), denies ETOH. Present to ED on 1/3 with one day of progressive weakness to right leg, five day history of headache, and reported multiple falls over the last 2 weeks, throughout the day leg began to become numb, resulting in a fall in the bathroom, denies hitting head but reports that left side of head has been swollen and painful.   While in ED patient presented alert and oriented, hypotensive, and tachycardic. CT head revealed large falcine subdural hematoma measuring up to 19 mm thick. Later in ED patient had a witnessed seizure, was administered keppra and ativan. Neurosurgery and neurology following. PCCM asked to consult.   PAST MEDICAL HISTORY :  He  has a past medical history of Anxiety; Arthritis; Asthma; Atrial flutter ([place]); Cholelithiasis; Chronic back pain; Chronic bronchitis ([place]); Congestive heart failure ([place]); COPD (chronic obstructive pulmonary disease) ([place]); Diabetic peripheral neuropathy ([place]); Diverticulosis; GERD (gastroesophageal reflux disease); Gout; Hyperlipidemia; Hypertension; Major depression; Morbid obesity ([place]); Myocardial infarction; OSA (obstructive sleep apnea) (1998); Pneumonia (2013; 2014); Psychosexual dysfunction with inhibited sexual excitement; Shortness of breath; and Type II diabetes mellitus ([place]) (dx'd 1991).  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  No Known Allergies  No current facility-administered medications on file prior to encounter.    Current Outpatient Prescriptions on File Prior to Encounter  Medication Sig  . allopurinol (ZYLOPRIM) 300 MG tablet Take 300 mg by mouth daily.  . Blood Glucose Monitoring Suppl (ACCU-CHEK AVIVA PLUS) w/Device KIT Use glucose meter to check blood sugars 4 times daily.  . budesonide (PULMICORT) 0.5 MG/2ML nebulizer solution Take 4 mLs (1 mg total) by nebulization 2 (two) times daily.  Marland Kitchen buPROPion (WELLBUTRIN SR) 150 MG 12 hr tablet Take 1 tablet (150 mg total) by mouth 2 (two) times daily.  . carvedilol (COREG) 12.5 MG tablet Take 1 tablet (12.5 mg total) by mouth 2 (two) times daily with a meal.  . colchicine 0.6 MG tablet Take 1 tablet (0.6 mg total) by mouth daily.  . diclofenac sodium (VOLTAREN) 1 % GEL Apply 2 g topically 4 (four) times daily.  . fluticasone (FLONASE) 50 MCG/ACT nasal spray Use one spray in each nostril daily for seasonal allergy  . furosemide (LASIX) 80 MG tablet Take 1 tablet (80 mg total) by mouth daily.  Marland Kitchen gabapentin (NEURONTIN) 600 MG tablet TAKE ONE TABLET BY MOUTH EVERY MORNING AND TAKE TWO TABLETS BY MOUTH EVERY EVENING  . glucose blood (ACCU-CHEK AVIVA PLUS) test strip Use to check blood sugar four times daily. Dx: E11.8  . insulin glargine (LANTUS) 100 unit/mL SOPN Inject 0.2 mLs (20 Units total) into the skin at bedtime.  . Insulin Pen Needle 31G X 8 MM MISC Use daily with the administration of lantus E11.8  . ipratropium (ATROVENT) 0.02 % nebulizer solution Take 2.5 mLs (0.5 mg total)  by nebulization 2 (two) times daily.  Marland Kitchen levalbuterol (XOPENEX HFA) 45 MCG/ACT inhaler Inhale 1-2 puffs into the lungs every 4 (four) hours as needed for wheezing. Reported on 12/13/2015  . levalbuterol  (XOPENEX) 0.63 MG/3ML nebulizer solution Take 3 mLs (0.63 mg total) by nebulization every 4 (four) hours as needed for wheezing. (Patient taking differently: Take 0.63 mg by nebulization 2 (two) times daily as needed for wheezing or shortness of breath. )  . losartan (COZAAR) 25 MG tablet Take 0.5 tablets (12.5 mg total) by mouth daily. Reported on 12/13/2015  . metolazone (ZAROXOLYN) 2.5 MG tablet Take 1 tablet (2.5 mg total) by mouth See admin instructions. Take 1 tablet (2.5 mg) by mouth up to twice a week as needed for weight gain of 7 lbs in 24 hours  . oxyCODONE (OXY IR/ROXICODONE) 5 MG immediate release tablet Take 1-2 tablets (5-10 mg total) by mouth every 4 (four) hours as needed for moderate pain, severe pain or breakthrough pain.  . potassium chloride SA (K-DUR,KLOR-CON) 20 MEQ tablet Take 1 tablet (20 mEq total) by mouth daily. (Patient taking differently: Take 20 mEq by mouth 2 (two) times daily. )  . PRESCRIPTION MEDICATION Inhale into the lungs See admin instructions. Use CPAP whenever sleeping  . sildenafil (VIAGRA) 100 MG tablet Take 1 tablet (100 mg total) by mouth daily as needed for erectile dysfunction. Reported on 12/13/2015  . XARELTO 20 MG TABS tablet TAKE 1 TABLET (20 MG TOTAL) BY MOUTH DAILY WITH SUPPER.    FAMILY HISTORY:  His indicated that his mother is deceased. He indicated that his father is deceased. He indicated that only one of his eight brothers is alive. He indicated that the status of his maternal grandmother is unknown. He indicated that both of his daughters are alive. He indicated that his son is alive. He indicated that the status of his maternal aunt is unknown. He indicated that the status of his paternal aunt is unknown. He indicated that the status of his paternal uncle is unknown.    SOCIAL HISTORY: He  reports that he quit smoking about 14 months ago. His smoking use included Cigarettes. He has a 1.20 pack-year smoking history. He has never used smokeless  tobacco. He reports that he drinks alcohol. He reports that he does not use drugs.  REVIEW OF SYSTEMS:   All negative; except for those that are bolded, which indicate positives.  Constitutional: weight loss, weight gain, night sweats, fevers, chills, fatigue, weakness.  HEENT: headaches, sore throat, sneezing, nasal congestion, post nasal drip, difficulty swallowing, tooth/dental problems, visual complaints, visual changes, ear aches. Neuro: difficulty with speech, weakness, numbness, ataxia. CV:  chest pain, orthopnea, PND, swelling in lower extremities, dizziness, palpitations, syncope.  Resp: cough, hemoptysis, dyspnea, wheezing. GI: heartburn, indigestion, abdominal pain, nausea, vomiting, diarrhea, constipation, change in bowel habits, loss of appetite, hematemesis, melena, hematochezia.  GU: dysuria, change in color of urine, urgency or frequency, flank pain, hematuria. MSK: joint pain or swelling, decreased range of motion. Psych: change in mood or affect, depression, anxiety, suicidal ideations, homicidal ideations. Skin: rash, itching, bruising.   SUBJECTIVE:  Just had a witnessed seizure, given ativan and keppra. No distress noted.   VITAL SIGNS: BP (!) 158/139   Pulse 75   Temp 99 F (37.2 C)   Resp (!) 30   Ht _0  (1.702 m)   Wt 120.2 kg (265 lb)   SpO2 98%   BMI 41.50 kg/m   HEMODYNAMICS:    VENTILATOR  SETTINGS:    INTAKE / OUTPUT: I/O last 3 completed shifts: In: 1000 [IV Piggyback:1000] Out: -   PHYSICAL EXAMINATION: General: Adult male, lying in bed, no distress   Neuro: Lethargic, oriented x 3, follows commands, 1/5 RUE/RLE, 4/5 LLE/LUE HEENT: Normocephalic  Cardiovascular: tachycardic, afib, no MRG   Lungs: crackles to bases, non-labored, on supplemental oxygen   Abdomen: non-tender, obese, active bs  Musculoskeletal: no deformities, weakness noted as above   Skin: warm, dry, intact     LABS:  BMET  Recent Labs Lab 12/13/2016 0400  NA  134*  K 3.1*  CL 93*  CO2 29  BUN 35*  CREATININE 1.06  GLUCOSE 228*    Electrolytes  Recent Labs Lab 12/13/2016 0400  CALCIUM 8.9    CBC  Recent Labs Lab 12/05/2016 0400  WBC 9.6  HGB 13.5  HCT 41.3  PLT 168    Coag's  Recent Labs Lab 12/22/2016 0400  INR 1.80    Sepsis Markers  Recent Labs Lab 12/15/2016 0419 12/03/2016 0635 12/10/2016 0812  LATICACIDVEN 2.25* 2.14*  --   PROCALCITON  --   --  0.32    ABG No results for input(s): PHART, PCO2ART, PO2ART in the last 168 hours.  Liver Enzymes  Recent Labs Lab 12/02/2016 0400  AST 39  ALT 32  ALKPHOS 77  BILITOT 1.0  ALBUMIN 2.8*    Cardiac Enzymes No results for input(s): TROPONINI, PROBNP in the last 168 hours.  Glucose  Recent Labs Lab 12/23/2016 1100  GLUCAP 244*    Imaging Ct Head Wo Contrast  Result Date: 12/15/2016 CLINICAL DATA:  Fall EXAM: CT HEAD WITHOUT CONTRAST TECHNIQUE: Contiguous axial images were obtained from the base of the skull through the vertex without intravenous contrast. COMPARISON:  None. FINDINGS: Brain: No acute territorial infarction is visualized. Moderate inter hemispheric subdural hematoma which is seen along the falx, with left-sided predominance, this measures 19 mm in maximum thickness on axial images. Mild hypodensity within the brain adjacent to the hematoma likely reflects mild edema. Subdural hematoma extends along the anterior interhemispheric fissure. Mild asymmetric density along the left tentorium would also be consistent with small tentorial blood. There is no midline shift. Mild increased density paralleling the subdural hematoma could reflect a small amount of subarachnoid blood versus small contusion. Ventricles are nonenlarged. Vascular: No hyperdense vessels. There are carotid artery calcifications. Vertebral artery calcifications Skull: The mastoid air cells are clear. There is no skull fracture visualized. Sinuses/Orbits: Mucosal thickening within the  maxillary and ethmoid sinuses. No acute orbital abnormality. Other: None IMPRESSION: Acute inter hemispheric subdural hematoma along the left aspect of the falx measuring 1.9 cm in maximum thickness with surrounding mild edema. No midline shift. Additional small amount of subdural blood is present along the left tentorium and along the anterior and posterior interhemispheric fissure. Mild hemorrhagic product paralleling the subdural hematoma may reflect a small amount of subarachnoid blood within adjacent sulci versus mild contusion. Critical Value/emergent results were called by telephone at the time of interpretation on 12/14/2016 at 4:00 am to Dr. Varney Biles , who verbally acknowledged these results. Electronically Signed   By: Donavan Foil M.D.   On: 12/01/2016 04:00   Dg Chest Portable 1 View  Result Date: 12/08/2016 CLINICAL DATA:  Cough. EXAM: PORTABLE CHEST 1 VIEW COMPARISON:  10/25/2016 FINDINGS: A single AP portable view of the chest demonstrates no focal airspace consolidation or alveolar edema. The lungs are grossly clear. There is no large effusion or pneumothorax.  There is unchanged cardiomegaly. Cardiac and mediastinal contours are otherwise unremarkable. IMPRESSION: Stable cardiomegaly.  No acute findings. Electronically Signed   By: Andreas Newport M.D.   On: 12/02/2016 04:30     STUDIES:  Echo Jan 2017 > LVEF 35-40% with regional wall motion abnormalities,akinesis of the anteroseptal and apical myocardium CT Head 1/4 > large falcine subdural hematoma measuring up to 19 mm thick unchanged, Minimal subarachnoid hemorrhage at sulci along the superior aspect of the interhemispheric fissure. No intracranial mass, intracerebral hemorrhage or evidence of acute infarction CXR 1/4 > Interstitial and alveolar pulmonary edema pattern without definite pleural effusions  CULTURES: UA 1/4 > Neg Urine 1/4 >  Blood 1/4 >  Flu 1/4 > Neg  ANTIBIOTICS: Rocephin 1/4 >1/4 Vancomycin 1/4  > Zosyn 1/4 >   SIGNIFICANT EVENTS: 1/4 > Presents to ED with weakness and headache   LINES/TUBES:   DISCUSSION: 56 year old male with PMH as written above, presents to ED on 1/4 with weakness of right LE and recent fall. New SDH was identified on CT head. While in ED patient had a witnessed seizure. Patient reports recent cocaine use last night 1/3. Currently patient is hypotensive, tachycardic, and lethargic, however easy to arouse. To be admitted to ICU with Neurology and Neurosurgery following.   ASSESSMENT / PLAN:  PULMONARY A: Acute on Chronic Respiratory Failure  H/O COPD, OSA (on CPAP at HS)  P:   Maintain oxygen >90  Continue home Pulmicort and xopenex  Follow CXR  CARDIOVASCULAR A: Hypotension  CHF  H/O Afib/flutter (on Xarelto), HTN  P: Cardiac Monitoring  Cardene if needed to maintain Systolic <025 Hold home HTN medications and Lasix  PRN metoprolol   RENAL A:   Hypokalemia  P:  Replace Electrolytes as needed Trend BMP  GASTROINTESTINAL A:   At risk for aspiration  P:   NPO Bedside swallow when appropriate   HEMATOLOGIC A:  On Chronic Anticoagulation (FFP administered in ED)  P:  Hold anticoagulation in setting of SDH Trend CBC   INFECTIOUS A:   Sepsis of unknown etiology (s/p 1L NS in ED) Elevated Lactic Acid - in setting of seizure  P:   Trend Fever and WBC curve Vanc and Zoysn  Follow Culture Data  Trend Lactic Acid and Procal  Gentle hydration given CHF   ENDOCRINE A:   DM P:   SSI  q4H glucose checks  Hold home Lantus   NEUROLOGIC A:   Interhemispheric SDH  -recent falls and weakness to right lower extremity  Status Epilepticus  Recent Cocaine Use  P:   Neurosurgery following  Neurology Following  Trend CT Head  EEG pending  PRN Ativan  Continue Keppra per neurology  RASS goal: 0    FAMILY  - Updates: Patient updated on plan, no family at bedside   - Inter-disciplinary family meet or Palliative Care meeting  due by: 1/11    Hayden Pedro, AG-ACNP [facility]  Pgr: [phone]  PCCM Pgr: [phone]  ATTENDING NOTE / ATTESTATION NOTE :   I have discussed the case with the resident/APP  Hayden Pedro NP.   I agree with the resident/APP's  history, physical examination, assessment, and plans.    I have edited the above note and modified it according to our agreed history, physical examination, assessment and plan.   Briefly, 56 year old male with PMH of DM, COPD, CHF, OSA (on CPAP at HS), atrial flutter (on xarelto), and polysubstance abuse (patient report  used cocaine last night), denies ETOH, presented  to ED on 1/3 with one day of progressive weakness to right leg, five day history of headache. Cranial CT scan showed a  large falcine subdural hematoma measuring up to 19 mm thick unchanged, Minimal subarachnoid hemorrhage at sulci along the superior aspect of the interhemispheric fissure. Neurosurgey decided conservative management given location of bleed. Pt was to be admitted to SDU.  He then remained HTNsive then had a brief sze at the ED.  He was given benzos and AED and PCCM was consulted to admit pt to 22M.  Post seizure meds, patient became hypotensive briefly but subsequently the blood pressure improved.  Neurosurgery recommended admission to 22M to  better observe blood pressure.  PE as above. Patient seen, comfortable,  in mild distress. He was uncomfortable on his bed. Blood pressure 116/70, heart rate 70, respiratory rate 20-30, 97% on 2 L. Morbidly obese. Crowded airway. No prominent neck veins. Good air entry. Decreased breath sounds at the bases. Good S1 and S2. Abdomen was benign. No masses. Grade 1 edema. Neuro exam unremarkable. No lateralizing sign.  Assessment and Plan : 1. Acute SDH. ICH. szes, Neurosurgery and neurology following. Patient is on chronic Xarelto would recent falls. He received fresh frozen plasma this morning. Neurosurgery will  conservatively follow the patient. If hematoma worsens, may benefit from [place]. Admit to 22M.  He may worsen neurologically.  At that point, he will need a rpt cranial ct scan.  Keep SBP < 160 mm Hg, unless neurosurgery thinks otherwise. Needs swallow evaln. Cont sze meds. Appreciate neurology and neurosurgery reccommodations.  2. Chronic atrial fibrillation. CHF EF EF 35-40%. Xarelto on hold. Need to determine whether he needs to be off anticoagulation indefinitely given recent repeated falls.  3. COPD, OSA. Cont neb meds. Cont cpap.   4. Lactic acidosis. Improved.  Infection being R/O. On vanc/zosyn. Deescalate once (-) cultures.     I have spent 30  minutes of critical care time with this patient today.  Family .  No family at bedside.    Monica Becton, MD 12/21/2016, 4:16 PM [facility] Pager [phone] After 3 pm or if no answer, call [phone]

## 2016-11-27 NOTE — ED Notes (Signed)
Neuro sx and admitting at bedside; pt given ativan for seizure

## 2016-11-27 NOTE — Progress Notes (Signed)
PCCM INTERVAL PROGRESS NOTE  Called to bedside to evaluate patient for R arm twitching. On my evaluation patient is very uncomfortable and experiencing rhythmic R arm twitching consistent with what is described in notes from ED. 2mg  ativan ordered and given with subsequent resolution of twitching/focal seizure. Patient not much more calm and less distressed. Will continue to monitor closely.   Joneen Roach, AGACNP-BC [facility]/Critical Care Pager [phone] or [phone]  2016/12/10 9:08 PM

## 2016-11-27 NOTE — Progress Notes (Signed)
eLink Physician-Brief Progress Note Patient Name: Jeremiah Perkins DOB: [birth year] MRN: [id number]   Date of Service  12/24/2016  HPI/Events of Note  Seizure activity earlier treated with ativan.  Now with change in MS and resp status requiring increase in O2.    eICU Interventions  Stat head CT Continue to montior     Intervention Category Major Interventions: Change in mental status - evaluation and management  Circe Chilton 12/13/2016, 11:29 PM

## 2016-11-27 NOTE — ED Notes (Signed)
Pt was set up to eat breakfast; pt then called out approx 1 min later stating his right leg would not stop moving; shaking of right leg noted and right arm twitching as well; ED PA assessed pt for immediate need and pt stopped shaking; admitting phy notified

## 2016-11-27 NOTE — Progress Notes (Signed)
EEG completed, results pending. 

## 2016-11-27 NOTE — Progress Notes (Signed)
eLink Physician-Brief Progress Note Patient Name: Jeremiah Perkins DOB: 03-05-1961 MRN: [id number]   Date of Service  12/13/2016  HPI/Events of Note  Pt having headache for last few hours, treated with morphine. He experienced a seizure that abated with ativan. Now with more HA.   eICU Interventions  Will control pain w morphine additional dose. Depending on neuro status and resp pattern may need another CT head to compare w this am, may need airway protection     Intervention Category Intermediate Interventions: Pain - evaluation and management  Milik Gilreath S. 12/13/2016, 10:34 PM

## 2016-11-27 NOTE — Procedures (Signed)
History: 56 yo M with SDH, seizures  Sedation: Ativan earlier  Technique: This is a 21 channel routine scalp EEG performed at the bedside with bipolar and monopolar montages arranged in accordance to the international 10/20 system of electrode placement. One channel was dedicated to EKG recording.    Background: The background consists of intermixed alpha and beta activities. There is a well defined posterior dominant rhythm of 9 Hz that attenuates with eye opening. There is an increase in delta associated with drowsiness, but no sleep is recorded.  Photic stimulation: Physiologic driving is not performed  EEG Abnormalities: None  Clinical Interpretation: This normal EEG is recorded in the waking and drowsy state. There was no seizure or seizure predisposition recorded on this study. Please note that a normal EEG does not preclude the possibility of epilepsy.   Ritta Slot, MD [facility] [phone]  If 7pm- 7am, please page neurology on call as listed in AMION.

## 2016-11-27 NOTE — ED Notes (Signed)
Patient transported to CT 

## 2016-11-27 NOTE — ED Notes (Signed)
Nurse will get istat lactic acid 

## 2016-11-27 NOTE — ED Notes (Signed)
X-Ray at bedside.

## 2016-11-27 NOTE — ED Notes (Signed)
Shown lactic acid to dr.nanavati

## 2016-11-27 NOTE — ED Notes (Signed)
EEG at bedside.

## 2016-11-27 NOTE — ED Notes (Signed)
Delay in lab draw,  Pt enroute to CT. 

## 2016-11-27 NOTE — ED Notes (Signed)
Neuro and PCCM at bedside

## 2016-11-27 NOTE — Progress Notes (Signed)
Patient ID: Jeremiah Perkins, male   DOB: 04/17/1961, 56 y.o.   MRN: [id number] I came to see the pt because of "twitching." He is having an active R arm and leg partial sz. He is awake and interactive. Headache no worse. Admits to cocaine use last pm.   Ativan ordered and given keppra ordered No change in MS - CT head not absolutely indicated at present

## 2016-11-27 NOTE — Consult Note (Signed)
Neurology Consultation Reason for Consult: seizure Referring Physician: Christene Slates, J  CC: Seizure  History is obtained from:patient, chart.   HPI: Jeremiah Perkins is a 56 y.o. male who fell several days ago and had persistent headaches. He has had several falls over the past week then fell again today and presented to the emergency room due to right leg weakness.   While in the ED, he began to have  Afocal right leg and arm seizure. His mental status was not affected during this seizure.   He was given ativan and keppra, and has become sleepy and developed some mild respiratory distress. Due to this and need for IV cardene, he is being admitted to the ICU.   ROS: A 14 point ROS was performed and is negative except as noted in the HPI.   Past Medical History:  Diagnosis Date  . Anxiety   . Arthritis    involving knees, ankles, back, elbows (01/02/2015)  . Asthma   . Atrial flutter (HCC)   . Cholelithiasis   . Chronic back pain   . Chronic bronchitis (HCC)    "get it q yr" (01/02/2015)  . Congestive heart failure (HCC)    No echo data available. Myocardial perfusion 2005 with EF 61%, presumed diastolic HF   . COPD (chronic obstructive pulmonary disease) (HCC)   . Diabetic peripheral neuropathy (HCC)   . Diverticulosis    with history of diverticulitis in 10/2011  . GERD (gastroesophageal reflux disease)   . Gout   . Hyperlipidemia   . Hypertension   . Major depression   . Morbid obesity (HCC)   . Myocardial infarction    "one doctor said I did back in 2014"  . OSA (obstructive sleep apnea) 1998   "wear my CPAP a couple times/month" (01/02/2015)  . Pneumonia 2013; 2014  . Psychosexual dysfunction with inhibited sexual excitement   . Shortness of breath    "all the time" (07/26/2013)  . Type II diabetes mellitus (HCC) dx'd 1991   previously on insulin in 2000 for 3-4 years (but was stopped because adamantly did not want to be on insulin)     Family History  Problem Relation  Age of Onset  . Diabetes Mother   . CAD Mother 59    requiring quadruple bypass  . Hypertension Mother   . Alzheimer's disease Mother   . Stomach cancer Father   . Lung cancer Father     was a smoker  . Gout Father   . CAD Brother 48    requiring quadruple bypass  . Seizures Brother   . Hypertension Brother   . Diabetes Maternal Grandmother   . Alzheimer's disease Maternal Grandmother   . Breast cancer Paternal Aunt   . Diabetes Maternal Aunt   . Seizures Paternal Uncle      Social History:  reports that he quit smoking about 14 months ago. His smoking use included Cigarettes. He has a 1.20 pack-year smoking history. He has never used smokeless tobacco. He reports that he drinks alcohol. He reports that he does not use drugs.   Exam: Current vital signs: BP 153/97   Pulse (!) 125   Temp 99 F (37.2 C)   Resp (!) 34   Ht 5\' 7"  (1.702 m)   Wt 120.2 kg (265 lb)   SpO2 99%   BMI 41.50 kg/m  Vital signs in last 24 hours: Temp:  [98.1 F (36.7 C)-100.2 F (37.9 C)] 99 F (37.2 C) (01/04  1008) Pulse Rate:  [46-156] 125 (01/04 1130) Resp:  [16-34] 34 (01/04 1130) BP: (86-167)/(38-139) 153/97 (01/04 1130) SpO2:  [89 %-100 %] 99 % (01/04 1130) Weight:  [120.2 kg (265 lb)] 120.2 kg (265 lb) (01/04 0212)   Physical Exam  Constitutional: Appears well-developed and well-nourished.  Psych: Affect appropriate to situation Eyes: No scleral injection HENT: No OP obstrucion Head: Normocephalic.  Cardiovascular: Normal rate and regular rhythm.  Respiratory: Effort normal and breath sounds normal to anterior ascultation GI: Soft.  No distension. There is no tenderness.  Skin: WDI  Neuro: Mental Status: Patient is somnolent but easily rousable.  No signs of aphasia or neglect Cranial Nerves: II: Visual Fields are full. Pupils are equal, round, and reactive to light.   III,IV, VI: EOMI without ptosis or diploplia.  V: Facial sensation is symmetric to temperature VII:  Facial movement is mildly weak on the right VIII: hearing is intact to voice X: Uvula elevates symmetrically XI: Shoulder shrug is symmetric. XII: tongue is midline without atrophy or fasciculations.  Motor: Tone is normal. Bulk is normal. 5/5 strength was present on the left, he has no movemetn in the right leg, and minimal movement in the right arm(able to squeeze hand with a flicker) Sensory: Sensation is diminished on the right.  Cerebellar: Did not perform.    I have reviewed labs in epic and the results pertinent to this consultation are: cmp - unremarkable   I have reviewed the images obtained:CT head- stable from this AM.   Impression: 56 yo M with ICH. He received FFP this am already, though there is no reversal agent for xarelto, k-centra could be used if his hematoma worsens. He appears to have stopped seizing following ativan and keppra.   Recommendations: 1) continue keppra 500mg  BID following 1.5g load 2) EEG 3) will follow.    Ritta Slot, MD [facility] [phone]  If 7pm- 7am, please page neurology on call as listed in AMION.

## 2016-11-28 ENCOUNTER — Inpatient Hospital Stay (HOSPITAL_COMMUNITY): Payer: PPO

## 2016-11-28 DIAGNOSIS — G9340 Encephalopathy, unspecified: Secondary | ICD-10-CM

## 2016-11-28 DIAGNOSIS — J9600 Acute respiratory failure, unspecified whether with hypoxia or hypercapnia: Secondary | ICD-10-CM

## 2016-11-28 LAB — COMPREHENSIVE METABOLIC PANEL
ALT: 31 U/L (ref 17–63)
AST: 46 U/L — AB (ref 15–41)
Albumin: 2.7 g/dL — ABNORMAL LOW (ref 3.5–5.0)
Alkaline Phosphatase: 80 U/L (ref 38–126)
Anion gap: 9 (ref 5–15)
BUN: 18 mg/dL (ref 6–20)
CHLORIDE: 101 mmol/L (ref 101–111)
CO2: 30 mmol/L (ref 22–32)
CREATININE: 1.05 mg/dL (ref 0.61–1.24)
Calcium: 8.5 mg/dL — ABNORMAL LOW (ref 8.9–10.3)
GFR calc Af Amer: >60 mL/min (ref >60)
GFR calc non Af Amer: >60 mL/min (ref >60)
GLUCOSE: 124 mg/dL — AB (ref 65–99)
Potassium: 3.7 mmol/L (ref 3.5–5.1)
SODIUM: 140 mmol/L (ref 135–145)
Total Bilirubin: 1.3 mg/dL — ABNORMAL HIGH (ref 0.3–1.2)
Total Protein: 6.2 g/dL — ABNORMAL LOW (ref 6.5–8.1)

## 2016-11-28 LAB — MAGNESIUM
MAGNESIUM: 1.9 mg/dL (ref 1.7–2.4)
MAGNESIUM: 1.9 mg/dL (ref 1.7–2.4)
MAGNESIUM: 1.8 mg/dL (ref 1.7–2.4)

## 2016-11-28 LAB — GLUCOSE, CAPILLARY
GLUCOSE-CAPILLARY: 135 mg/dL — AB (ref 65–99)
GLUCOSE-CAPILLARY: 106 mg/dL — AB (ref 65–99)
GLUCOSE-CAPILLARY: 189 mg/dL — AB (ref 65–99)
Glucose-Capillary: 105 mg/dL — ABNORMAL HIGH (ref 65–99)
Glucose-Capillary: 181 mg/dL — ABNORMAL HIGH (ref 65–99)
Glucose-Capillary: 217 mg/dL — ABNORMAL HIGH (ref 65–99)
Glucose-Capillary: 96 mg/dL (ref 65–99)

## 2016-11-28 LAB — URINE CULTURE
Culture: <10000 — AB
Culture: NO GROWTH

## 2016-11-28 LAB — BLOOD GAS, ARTERIAL
ACID-BASE EXCESS: 3.8 mmol/L — AB (ref 0.0–2.0)
Bicarbonate: 28 mmol/L (ref 20.0–28.0)
Drawn by: 345601
FIO2: 50
O2 SAT: 94.6 %
PEEP: 5 cmH2O
PH ART: 7.42 (ref 7.350–7.450)
Patient temperature: 98.6
RATE: 18 resp/min
VT: 530 mL
pCO2 arterial: 44 mmHg (ref 32.0–48.0)
pO2, Arterial: 78.1 mmHg — ABNORMAL LOW (ref 83.0–108.0)

## 2016-11-28 LAB — PROTIME-INR
INR: 1.43
Prothrombin Time: 17.6 seconds — ABNORMAL HIGH (ref 11.4–15.2)

## 2016-11-28 LAB — PREPARE FRESH FROZEN PLASMA
BLOOD PRODUCT EXPIRATION DATE: 201801082359
Blood Product Expiration Date: 201801082359
ISSUE DATE / TIME: 201801040554
ISSUE DATE / TIME: 201801040914
UNIT TYPE AND RH: 6200
Unit Type and Rh: 6200

## 2016-11-28 LAB — CBC
HCT: 40.8 % (ref 39.0–52.0)
HEMOGLOBIN: 13 g/dL (ref 13.0–17.0)
MCH: 29.3 pg (ref 26.0–34.0)
MCHC: 31.9 g/dL (ref 30.0–36.0)
MCV: 92.1 fL (ref 78.0–100.0)
Platelets: 163 10*3/uL (ref 150–400)
RBC: 4.43 MIL/uL (ref 4.22–5.81)
RDW: 17.5 % — ABNORMAL HIGH (ref 11.5–15.5)
WBC: 8.8 10*3/uL (ref 4.0–10.5)

## 2016-11-28 LAB — PHOSPHORUS
Phosphorus: 3.5 mg/dL (ref 2.5–4.6)
Phosphorus: 2.9 mg/dL (ref 2.5–4.6)
Phosphorus: 3.3 mg/dL (ref 2.5–4.6)

## 2016-11-28 LAB — APTT: aPTT: 34 seconds (ref 24–36)

## 2016-11-28 LAB — TRIGLYCERIDES: Triglycerides: 116 mg/dL (ref <150)

## 2016-11-28 LAB — TROPONIN I: Troponin I: 0.13 ng/mL (ref <0.03)

## 2016-11-28 MED ORDER — ACETAMINOPHEN 160 MG/5ML PO SOLN
650.0000 mg | Freq: Four times a day (QID) | ORAL | Status: DC | PRN
  Administered 2016-11-28 – 2016-12-10 (×20): 650 mg via ORAL
  Filled 2016-11-28 (×21): qty 20.3

## 2016-11-28 MED ORDER — SODIUM CHLORIDE 0.9 % IV SOLN
1000.0000 mg | Freq: Two times a day (BID) | INTRAVENOUS | Status: DC
  Administered 2016-11-28 – 2016-12-09 (×22): 1000 mg via INTRAVENOUS
  Filled 2016-11-28 (×23): qty 10

## 2016-11-28 MED ORDER — SODIUM CHLORIDE 0.9 % IV SOLN
1000.0000 mg | Freq: Once | INTRAVENOUS | Status: AC
  Administered 2016-11-28: 1000 mg via INTRAVENOUS
  Filled 2016-11-28: qty 10

## 2016-11-28 MED ORDER — SODIUM CHLORIDE 0.9 % IV SOLN
500.0000 mg | Freq: Once | INTRAVENOUS | Status: AC
  Administered 2016-11-28: 500 mg via INTRAVENOUS
  Filled 2016-11-28: qty 5

## 2016-11-28 MED ORDER — MIDAZOLAM HCL 2 MG/2ML IJ SOLN
INTRAMUSCULAR | Status: AC
  Filled 2016-11-28: qty 2

## 2016-11-28 MED ORDER — FENTANYL CITRATE (PF) 100 MCG/2ML IJ SOLN
100.0000 ug | INTRAMUSCULAR | Status: AC | PRN
  Administered 2016-11-28 – 2016-12-10 (×3): 100 ug via INTRAVENOUS
  Filled 2016-11-28 (×3): qty 2

## 2016-11-28 MED ORDER — FAMOTIDINE IN NACL 20-0.9 MG/50ML-% IV SOLN
20.0000 mg | Freq: Two times a day (BID) | INTRAVENOUS | Status: DC
Start: 2016-11-28 — End: 2016-12-08
  Administered 2016-11-28 – 2016-12-07 (×21): 20 mg via INTRAVENOUS
  Filled 2016-11-28 (×21): qty 50

## 2016-11-28 MED ORDER — VITAL HIGH PROTEIN PO LIQD
1000.0000 mL | ORAL | Status: DC
  Administered 2016-11-28: 1000 mL

## 2016-11-28 MED ORDER — ORAL CARE MOUTH RINSE
15.0000 mL | Freq: Four times a day (QID) | OROMUCOSAL | Status: DC
  Administered 2016-11-28 (×3): 15 mL via OROMUCOSAL

## 2016-11-28 MED ORDER — ETOMIDATE 2 MG/ML IV SOLN
20.0000 mg | Freq: Once | INTRAVENOUS | Status: AC
  Administered 2016-11-28: 20 mg via INTRAVENOUS

## 2016-11-28 MED ORDER — PROPOFOL 1000 MG/100ML IV EMUL
0.0000 ug/kg/min | INTRAVENOUS | Status: DC
  Administered 2016-11-28: 25 ug/kg/min via INTRAVENOUS
  Administered 2016-11-28: 20 ug/kg/min via INTRAVENOUS
  Administered 2016-11-28: 25 ug/kg/min via INTRAVENOUS
  Administered 2016-11-28: 35 ug/kg/min via INTRAVENOUS
  Administered 2016-11-28 – 2016-11-30 (×8): 25 ug/kg/min via INTRAVENOUS
  Administered 2016-12-01 (×2): 20 ug/kg/min via INTRAVENOUS
  Administered 2016-12-01: 15 ug/kg/min via INTRAVENOUS
  Administered 2016-12-01 – 2016-12-02 (×2): 20 ug/kg/min via INTRAVENOUS
  Administered 2016-12-02 – 2016-12-04 (×6): 15 ug/kg/min via INTRAVENOUS
  Administered 2016-12-05 – 2016-12-06 (×4): 10 ug/kg/min via INTRAVENOUS
  Administered 2016-12-07: 12 ug/kg/min via INTRAVENOUS
  Filled 2016-11-28 (×33): qty 100

## 2016-11-28 MED ORDER — ROCURONIUM BROMIDE 50 MG/5ML IV SOLN: 20.0000 mg | Freq: Once | INTRAVENOUS | Status: DC

## 2016-11-28 MED ORDER — FENTANYL CITRATE (PF) 100 MCG/2ML IJ SOLN
100.0000 ug | Freq: Once | INTRAMUSCULAR | Status: AC
  Administered 2016-11-28: 100 ug via INTRAVENOUS

## 2016-11-28 MED ORDER — LEVETIRACETAM 500 MG/5ML IV SOLN
750.0000 mg | Freq: Two times a day (BID) | INTRAVENOUS | Status: DC
  Administered 2016-11-28: 750 mg via INTRAVENOUS
  Filled 2016-11-28 (×2): qty 7.5

## 2016-11-28 MED ORDER — PROPOFOL 1000 MG/100ML IV EMUL
INTRAVENOUS | Status: AC
  Filled 2016-11-28: qty 100

## 2016-11-28 MED ORDER — PRO-STAT SUGAR FREE PO LIQD
30.0000 mL | Freq: Two times a day (BID) | ORAL | Status: DC
  Administered 2016-11-28: 30 mL
  Filled 2016-11-28: qty 30

## 2016-11-28 MED ORDER — ROCURONIUM BROMIDE 50 MG/5ML IV SOLN
50.0000 mg | Freq: Once | INTRAVENOUS | Status: AC
Start: 2016-11-28 — End: 2016-11-28
  Administered 2016-11-28: 50 mg via INTRAVENOUS

## 2016-11-28 MED ORDER — SODIUM CHLORIDE 0.9 % IV SOLN: 1000.0000 mg | Freq: Once | INTRAVENOUS | Status: DC

## 2016-11-28 MED ORDER — CHLORHEXIDINE GLUCONATE 0.12% ORAL RINSE (MEDLINE KIT)
15.0000 mL | Freq: Two times a day (BID) | OROMUCOSAL | Status: DC
  Administered 2016-11-29 – 2016-12-16 (×35): 15 mL via OROMUCOSAL

## 2016-11-28 MED ORDER — BUDESONIDE 0.5 MG/2ML IN SUSP
0.5000 mg | Freq: Two times a day (BID) | RESPIRATORY_TRACT | Status: DC
  Administered 2016-11-28 – 2016-12-16 (×36): 0.5 mg via RESPIRATORY_TRACT
  Filled 2016-11-28 (×36): qty 2

## 2016-11-28 MED ORDER — FENTANYL CITRATE (PF) 100 MCG/2ML IJ SOLN
100.0000 ug | INTRAMUSCULAR | Status: DC | PRN
  Administered 2016-12-04 – 2016-12-15 (×8): 100 ug via INTRAVENOUS
  Filled 2016-11-28 (×10): qty 2

## 2016-11-28 MED ORDER — PRO-STAT SUGAR FREE PO LIQD
60.0000 mL | Freq: Four times a day (QID) | ORAL | Status: DC
  Administered 2016-11-28 – 2016-12-09 (×43): 60 mL
  Filled 2016-11-28 (×40): qty 60

## 2016-11-28 MED ORDER — MIDAZOLAM HCL 2 MG/2ML IJ SOLN
4.0000 mg | Freq: Once | INTRAMUSCULAR | Status: AC
  Administered 2016-11-28: 4 mg via INTRAVENOUS

## 2016-11-28 MED ORDER — CHLORHEXIDINE GLUCONATE 0.12% ORAL RINSE (MEDLINE KIT)
15.0000 mL | Freq: Two times a day (BID) | OROMUCOSAL | Status: DC
  Administered 2016-11-28 (×3): 15 mL via OROMUCOSAL

## 2016-11-28 MED ORDER — VITAL HIGH PROTEIN PO LIQD
1000.0000 mL | ORAL | Status: DC
  Administered 2016-11-28 – 2016-11-29 (×3): 1000 mL
  Administered 2016-11-30 (×2)
  Administered 2016-11-30: 1000 mL
  Administered 2016-11-30 – 2016-12-01 (×2)
  Administered 2016-12-01: 1000 mL
  Administered 2016-12-02: 08:00:00
  Administered 2016-12-02: 1000 mL
  Administered 2016-12-03: 22:00:00
  Administered 2016-12-03: 1000 mL
  Administered 2016-12-03 (×2)
  Administered 2016-12-04 – 2016-12-09 (×5): 1000 mL

## 2016-11-28 MED ORDER — ORAL CARE MOUTH RINSE
15.0000 mL | OROMUCOSAL | Status: DC
  Administered 2016-11-29 – 2016-12-16 (×174): 15 mL via OROMUCOSAL

## 2016-11-28 NOTE — Progress Notes (Signed)
Subjective: Seizures overnight leading to intubation, now with persistent right hemiparesis.   Exam: Vitals:   11/28/16 0600 11/28/16 0810  BP: 108/65 93/69  Pulse: (!) 116 (!) 107  Resp: 18 18  Temp:  (!) 101.1 F (38.4 C)   Gen: In bed, NAD Resp: non-labored breathing, no acute distress Abd: soft, nt  Sedated on propofl Neuro: MS: awakenes to nox stim, does nto follow commands CN: PERRL, face symmetric Motor: withdraws on the left, not on right.  Sensory:as above  Pertinent Labs: nml creatinine  Impression: 56 yo M with parafalcine SDH and seizures. With persistent  Weakness, I hav concern for ongoing partial seizure, will get stat EEG  Recommendations: 1)STAT EEG 2) Keppra 1 g x1 stat 3) further recs depending on EEG results.   Ritta Slot, MD [facility] [phone]  If 7pm- 7am, please page neurology on call as listed in AMION.

## 2016-11-28 NOTE — Care Management Note (Signed)
Case Management Note  Patient Details  Name: Jeremiah Perkins MRN: [id number] Date of Birth: 1961/03/12  Subjective/Objective: Pt admitted on December 12, 2016 with new SDH.  PTA, pt independent, lives with spouse.                     Action/Plan: Pt currently remains sedated and on ventilator.  Will consult CSW for substance abuse counseling, as positive for cocaine on admission.  Will follow progress.    Expected Discharge Date:                  Expected Discharge Plan:  IP Rehab Facility  In-House Referral:  Clinical Social Work  Discharge planning Services  CM Consult  Post Acute Care Choice:    Choice offered to:     DME Arranged:    DME Agency:     HH Arranged:    HH Agency:     Status of Service:  In process, will continue to follow  If discussed at Long Length of Stay Meetings, dates discussed:    Additional Comments:  Glennon Mac, RN 11/28/2016, 3:51 PM

## 2016-11-28 NOTE — Progress Notes (Signed)
Patient ID: Jeremiah Perkins, male   DOB: 21-May-1961, 56 y.o.   MRN: [id number] Subjective: Patient is now intubated and sedated because of seizure activity and so no history can be taken  Objective: Vital signs in last 24 hours: Temp:  [98.2 F (36.8 C)-101.1 F (38.4 C)] 101.1 F (38.4 C) (01/05 0810) Pulse Rate:  [50-142] 114 (01/05 1000) Resp:  [14-40] 18 (01/05 1000) BP: (74-199)/(49-153) 104/77 (01/05 1000) SpO2:  [86 %-100 %] 97 % (01/05 1000) FiO2 (%):  [30 %-60 %] 30 % (01/05 0810) Weight:  [122.9 kg (270 lb 15.1 oz)] 122.9 kg (270 lb 15.1 oz) (01/05 0400)  Intake/Output from previous day: 01/04 0701 - 01/05 0700 In: 2258.7 [I.V.:259.7; Blood:514; IV Piggyback:1485] Out: 800 [Urine:800] Intake/Output this shift: Total I/O In: 121.4 [I.V.:71.4; IV Piggyback:50] Out: -   Intubated and sedated with propofol  Lab Results: Lab Results  Component Value Date   WBC 8.8 11/28/2016   HGB 13.0 11/28/2016   HCT 40.8 11/28/2016   MCV 92.1 11/28/2016   PLT 163 11/28/2016   Lab Results  Component Value Date   INR 1.43 11/28/2016   BMET Lab Results  Component Value Date   NA 140 11/28/2016   K 3.7 11/28/2016   CL 101 11/28/2016   CO2 30 11/28/2016   GLUCOSE 124 (H) 11/28/2016   BUN 18 11/28/2016   CREATININE 1.05 11/28/2016   CALCIUM 8.5 (L) 11/28/2016    Studies/Results: Ct Head Wo Contrast  Result Date: 11/28/2016 CLINICAL DATA:  Altered mental status EXAM: CT HEAD WITHOUT CONTRAST TECHNIQUE: Contiguous axial images were obtained from the base of the skull through the vertex without intravenous contrast. COMPARISON:  12/22/2016, FINDINGS: Brain: Again visualized is in interhemispheric subdural hematoma along the falx measuring 19 mm in maximal thickness at the vertex. Small amount of subarachnoid hemorrhage in the parafalcine region is unchanged. Hemorrhage again noted anteriorly and posteriorly along the falx with mild asymmetric thickening of the left tentorium.  Mild edema surrounding the hemorrhage, unchanged. Ventricles are stable in size. Vascular: No hyperdense vessels. Carotid artery and vertebral artery calcifications. Skull: No fracture. Sinuses/Orbits: Mucosal disease in the maxillary and ethmoid and sphenoid sinuses. No acute orbital abnormality Other: None IMPRESSION: 1. No significant interval change in parafalcine subdural hematoma measuring 19 mm in maximum thickness with extension of hematoma along the left tentorium. Small amount of adjacent subarachnoid hemorrhage is also unchanged. Electronically Signed   By: Jasmine Pang M.D.   On: 11/28/2016 01:39   Ct Head Wo Contrast  Result Date: 2016-12-22 CLINICAL DATA:  Multiple falls and bleed, focal seizures, history CHF, hypertension, atrial flutter, GERD, type II diabetes mellitus, COPD, coronary disease post MI EXAM: CT HEAD WITHOUT CONTRAST TECHNIQUE: Contiguous axial images were obtained from the base of the skull through the vertex without intravenous contrast. Sagittal and coronal MPR images reconstructed from axial data set. COMPARISON:  Earlier exam 12-22-16 FINDINGS: Brain: Mild generalized atrophy. Stable ventricular morphology. Again identified large falcine subdural hematoma measuring up to 19 mm thick unchanged. Minimal extension onto the LEFT tentorium. Minimal subarachnoid hemorrhage at sulci along the superior aspect of the interhemispheric fissure. No intracranial mass, intracerebral hemorrhage or evidence of acute infarction. Vascular: Unremarkable Skull: Intact Sinuses/Orbits: Minimal mucosal thickening RIGHT maxillary sinus. Other: N/A IMPRESSION: Stable appearance of subdural hematoma along the falx, up to 19 mm thick. Minimal subarachnoid hemorrhage at the interhemispheric fissure. No new intracranial abnormalities. Electronically Signed   By: Ulyses Southward M.D.   On:  12/07/2016 11:33   Ct Head Wo Contrast  Result Date: 11/28/2016 CLINICAL DATA:  Fall EXAM: CT HEAD WITHOUT CONTRAST  TECHNIQUE: Contiguous axial images were obtained from the base of the skull through the vertex without intravenous contrast. COMPARISON:  None. FINDINGS: Brain: No acute territorial infarction is visualized. Moderate inter hemispheric subdural hematoma which is seen along the falx, with left-sided predominance, this measures 19 mm in maximum thickness on axial images. Mild hypodensity within the brain adjacent to the hematoma likely reflects mild edema. Subdural hematoma extends along the anterior interhemispheric fissure. Mild asymmetric density along the left tentorium would also be consistent with small tentorial blood. There is no midline shift. Mild increased density paralleling the subdural hematoma could reflect a small amount of subarachnoid blood versus small contusion. Ventricles are nonenlarged. Vascular: No hyperdense vessels. There are carotid artery calcifications. Vertebral artery calcifications Skull: The mastoid air cells are clear. There is no skull fracture visualized. Sinuses/Orbits: Mucosal thickening within the maxillary and ethmoid sinuses. No acute orbital abnormality. Other: None IMPRESSION: Acute inter hemispheric subdural hematoma along the left aspect of the falx measuring 1.9 cm in maximum thickness with surrounding mild edema. No midline shift. Additional small amount of subdural blood is present along the left tentorium and along the anterior and posterior interhemispheric fissure. Mild hemorrhagic product paralleling the subdural hematoma may reflect a small amount of subarachnoid blood within adjacent sulci versus mild contusion. Critical Value/emergent results were called by telephone at the time of interpretation on 12/24/2016 at 4:00 am to Dr. Derwood Kaplan , who verbally acknowledged these results. Electronically Signed   By: Jasmine Pang M.D.   On: 12/20/2016 04:00   Portable Chest Xray  Result Date: 11/28/2016 CLINICAL DATA:  Intubation EXAM: PORTABLE CHEST 1 VIEW  COMPARISON:  12/02/2016 FINDINGS: Interim intubation, tip of the endotracheal tube is just above the carina. Esophageal tube extends below the diaphragm but is not included. Right lung base is not included. Persistent cardiomegaly with central congestion and moderate interstitial edema. No pneumothorax IMPRESSION: 1. Endotracheal tube tip is just above the carina 2. Cardiomegaly central congestion and moderate interstitial edema Electronically Signed   By: Jasmine Pang M.D.   On: 11/28/2016 01:00   Dg Chest Port 1 View  Result Date: 12/24/2016 CLINICAL DATA:  Hypoxia. EXAM: PORTABLE CHEST 1 VIEW COMPARISON:  12/19/2016 FINDINGS: Cardiac enlargement is stable. Stable prominent mediastinal and hilar contours. Interstitial and alveolar pulmonary edema pattern without definite pleural effusions. IMPRESSION: Cardiac enlargement and CHF. Electronically Signed   By: Rudie Meyer M.D.   On: 11/29/2016 11:50   Dg Chest Portable 1 View  Result Date: 12/04/2016 CLINICAL DATA:  Cough. EXAM: PORTABLE CHEST 1 VIEW COMPARISON:  10/25/2016 FINDINGS: A single AP portable view of the chest demonstrates no focal airspace consolidation or alveolar edema. The lungs are grossly clear. There is no large effusion or pneumothorax. There is unchanged cardiomegaly. Cardiac and mediastinal contours are otherwise unremarkable. IMPRESSION: Stable cardiomegaly.  No acute findings. Electronically Signed   By: Ellery Plunk M.D.   On: 12/03/2016 04:30   Dg Abd Portable 1v  Result Date: 11/28/2016 CLINICAL DATA:  Orogastric tube placement EXAM: PORTABLE ABDOMEN - 1 VIEW COMPARISON:  CT 10/26/2016 FINDINGS: Esophageal tube tip projects over the mid stomach, the side port projects over the gastric fundus. Surgical clips in the right upper quadrant. Bibasilar atelectasis or infiltrates. Upper abdominal gas pattern is nonobstructed. IMPRESSION: Esophageal tube tip projects over the mid stomach Electronically Signed   By:  Jasmine Pang  M.D.   On: 11/28/2016 01:02    Assessment/Plan: CT scan was reviewed. There is no change. I still do not feel this is a surgical lesion despite his seizure activity. There would be significant risk of neurologic injury with such an operation. It may or may not reduce the risk of seizure activity. We will continue to follow.   LOS: 1 day    Jeremiah Perkins 11/28/2016, 10:44 AM

## 2016-11-28 NOTE — Progress Notes (Signed)
EEG completed, results pending. 

## 2016-11-28 NOTE — Progress Notes (Signed)
eLink Physician-Brief Progress Note Patient Name: Jeremiah Perkins DOB: 10/22/1961 MRN: [id number]   Date of Service  11/28/2016  HPI/Events of Note  Best Practice  eICU Interventions  H2 Blocker for stress ulcer propy     Intervention Category Intermediate Interventions: Best-practice therapies (e.g. DVT, beta blocker, etc.)  [name] 11/28/2016, 1:26 AM

## 2016-11-28 NOTE — Consult Note (Signed)
   [facility] CM Inpatient Consult   11/28/2016  Jeremiah Perkins 30-Jun-1961 [id number]   Chart reviewed for active patient with [facility] Care Management.  Chart review reveals the patient is a 57 year old male with PMH of DM, COPD, CHF, OSA (on CPAP at HS), atrial flutter (on xarelto), and polysubstance abuse (patient report used cocaine last night), denies ETOH. Present to ED on 1/3 with new  SDH. Transferred to ICU and ultimately required intubation.  [facility] Community Nurse, Rinaldo Cloud notified of admission.  [facility] Care Management will follow up at more appropriate time on patient's progression and disposition.  Charlesetta Shanks, RN BSN CCM [facility]  [phone] business mobile phone Toll free office [phone]

## 2016-11-28 NOTE — Progress Notes (Signed)
Pt placed on vLTM EEG after routine EEG was done earlier today.  Capture any seizure like events when weaned off sedation

## 2016-11-28 NOTE — Progress Notes (Signed)
Pt right arm started twitching around 2100.  Provider was rounding on unit and was called in to assess pt.  Provider determine pt was seizing and 2 mg of ativan was given to pt per verbal order.  Elink was notify for inc respiratory effort. 1 time dose of 4 mg of morphine was given around 2200.  Pt arm began to twitch again around 2300.  2 mg of ativan was given and twitching stop.  Pt respiratory effort continue to inc and elink was notified.  Ct was order and pt got intubated per MD .

## 2016-11-28 NOTE — Procedures (Signed)
ELECTROENCEPHALOGRAM REPORT  Date of Study: 11/28/2016  Patient's Name: Jeremiah Perkins MRN: [id number] Date of Birth: February 12, 1961  Referring Provider: Dr. Roland Rack  Clinical History: This is a 56 year old man with a parafalcine SDH and seizures, with right arm twitching.  Medications: levETIRAcetam (KEPPRA) 750 mg in sodium chloride 0.9 % 100 mL IVPB  propofol (DIPRIVAN) 1000 MG/100ML infusion  budesonide (PULMICORT) nebulizer solution 1 mg  chlorhexidine gluconate (MEDLINE KIT) (PERIDEX) 0.12 % solution 15 mL  famotidine (PEPCID) IVPB 20 mg premix  fentaNYL (SUBLIMAZE) injection 100 mcg  fentaNYL (SUBLIMAZE) injection 100 mcg  insulin aspart (novoLOG) injection 2-6 Units  ipratropium (ATROVENT) nebulizer solution 0.5 mg  levalbuterol (XOPENEX) nebulizer solution 0.63 mg  metoprolol (LOPRESSOR) injection 5 mg  piperacillin-tazobactam (ZOSYN) IVPB 3.375 g  vancomycin (VANCOCIN) 1,250 mg in sodium chloride 0.9 % 250 mL IVPB   Technical Summary: A multichannel digital EEG recording measured by the international 10-20 system with electrodes applied with paste and impedances below 5000 ohms performed as portable with EKG monitoring in an intubated and sedated patient.  Hyperventilation and photic stimulation were not performed.  The digital EEG was referentially recorded, reformatted, and digitally filtered in a variety of bipolar and referential montages for optimal display.   Description: The patient is intubated and sedated on Propofol during the recording. There is no clear posterior dominant rhythm. The background consists of diffuse beta activity admixed with theta and delta slowing. Normal sleep architecture is not seen. No reactivity to stimulation. Hyperventilation and photic stimulation were not performed.  There were no epileptiform discharges or electrographic seizures seen.    EKG lead showed irregular rhythm.  Impression: This sedated EEG is abnormal due to  mild diffuse slowing of the background.  Clinical Correlation of the above findings indicates diffuse cerebral dysfunction that is non-specific in etiology and can be seen with hypoxic/ischemic injury, toxic/metabolic encephalopathies,or medication effect from Propofol. No twitching episodes captured. There were no electrographic seizures seen in this study. The absence of epileptiform discharges does not exclude a clinical diagnosis of epilepsy. Clinical correlation is advised.   Ellouise Newer, M.D.

## 2016-11-28 NOTE — Progress Notes (Signed)
PULMONARY / CRITICAL CARE MEDICINE   Name: Jeremiah Perkins MRN: [id number] DOB: June 10, 1961    ADMISSION DATE:  12/13/2016 CONSULTATION DATE:  11/26/2016  REFERRING MD:  Dr. Allena Katz   CHIEF COMPLAINT: SDH   HISTORY OF PRESENT ILLNESS:   56 year old male with PMH of DM, COPD, CHF, OSA (on CPAP at HS), atrial flutter (on xarelto), and polysubstance abuse (patient report used cocaine last night), denies ETOH. Present to ED on 1/3 with new  SDH. Transferred to ICU and ultimately required intubation.   SUBJECTIVE:  Intubated overnight for worsening mental status. This AM remains intubated and on sedation.   VITAL SIGNS: BP 104/77   Pulse (!) 114   Temp (!) 101.1 F (38.4 C) (Axillary)   Resp 18   Ht 5\' 7"  (1.702 m)   Wt 122.9 kg (270 lb 15.1 oz)   SpO2 97%   BMI 42.44 kg/m   HEMODYNAMICS:    VENTILATOR SETTINGS: Vent Mode: PRVC FiO2 (%):  [30 %-60 %] 30 % Set Rate:  [18 bmp] 18 bmp Vt Set:  [530 mL] 530 mL PEEP:  [5 cmH20] 5 cmH20 Plateau Pressure:  [18 cmH20-22 cmH20] 22 cmH20  INTAKE / OUTPUT: I/O last 3 completed shifts: In: 3258.7 [I.V.:259.7; Blood:514; IV Piggyback:2485] Out: 800 [Urine:800]  PHYSICAL EXAMINATION: General: Adult male, lying in bed, no distress   Neuro: Sedated, not following commands, grimices to painful stimuli  HEENT: Normocephalic  Cardiovascular: tachycardic, afib, no MRG   Lungs: crackles to bases, non-labored, on vent  Abdomen: non-tender, obese, active bs  Musculoskeletal: no deformities Skin: warm, dry, intact     LABS:  BMET  Recent Labs Lab 12/18/2016 0400 11/28/2016 1153 11/28/16 0323  NA 134* 136 140  K 3.1* 2.8* 3.7  CL 93* 95* 101  CO2 29 28 30   BUN 35* 28* 18  CREATININE 1.06 1.02 1.05  GLUCOSE 228* 230* 124*    Electrolytes  Recent Labs Lab 12/21/2016 0400 12/07/2016 1153 11/28/16 0323  CALCIUM 8.9 8.7* 8.5*  MG  --  1.7 1.9  PHOS  --  3.8 3.5    CBC  Recent Labs Lab 12/08/2016 0400 12/18/2016 1153  11/28/16 0323  WBC 9.6 12.1* 8.8  HGB 13.5 12.7* 13.0  HCT 41.3 38.0* 40.8  PLT 168 157 163    Coag's  Recent Labs Lab 12/08/2016 0400 11/28/16 0323  APTT  --  34  INR 1.80 1.43    Sepsis Markers  Recent Labs Lab 11/26/2016 0635 11/24/2016 0812 12/19/2016 1153 12/13/2016 1436  LATICACIDVEN 2.14*  --  1.5 1.5  PROCALCITON  --  0.32  --   --     ABG  Recent Labs Lab 11/28/16 0145  PHART 7.420  PCO2ART 44.0  PO2ART 78.1*    Liver Enzymes  Recent Labs Lab 12/08/2016 0400 11/28/16 0323  AST 39 46*  ALT 32 31  ALKPHOS 77 80  BILITOT 1.0 1.3*  ALBUMIN 2.8* 2.7*    Cardiac Enzymes  Recent Labs Lab 11/29/2016 1153 12/04/2016 1833 12/03/2016 2354  TROPONINI 0.10* 0.15* 0.13*    Glucose  Recent Labs Lab 11/30/2016 1100 12/01/2016 1649 12/07/2016 1938 11/28/16 0150 11/28/16 0336 11/28/16 0816  GLUCAP 244* 152* 102* 135* 106* 96    Imaging Ct Head Wo Contrast  Result Date: 11/28/2016 CLINICAL DATA:  Altered mental status EXAM: CT HEAD WITHOUT CONTRAST TECHNIQUE: Contiguous axial images were obtained from the base of the skull through the vertex without intravenous contrast. COMPARISON:  12/23/2016, FINDINGS:  Brain: Again visualized is in interhemispheric subdural hematoma along the falx measuring 19 mm in maximal thickness at the vertex. Small amount of subarachnoid hemorrhage in the parafalcine region is unchanged. Hemorrhage again noted anteriorly and posteriorly along the falx with mild asymmetric thickening of the left tentorium. Mild edema surrounding the hemorrhage, unchanged. Ventricles are stable in size. Vascular: No hyperdense vessels. Carotid artery and vertebral artery calcifications. Skull: No fracture. Sinuses/Orbits: Mucosal disease in the maxillary and ethmoid and sphenoid sinuses. No acute orbital abnormality Other: None IMPRESSION: 1. No significant interval change in parafalcine subdural hematoma measuring 19 mm in maximum thickness with extension of  hematoma along the left tentorium. Small amount of adjacent subarachnoid hemorrhage is also unchanged. Electronically Signed   By: Jasmine Pang M.D.   On: 11/28/2016 01:39   Ct Head Wo Contrast  Result Date: 12/11/2016 CLINICAL DATA:  Multiple falls and bleed, focal seizures, history CHF, hypertension, atrial flutter, GERD, type II diabetes mellitus, COPD, coronary disease post MI EXAM: CT HEAD WITHOUT CONTRAST TECHNIQUE: Contiguous axial images were obtained from the base of the skull through the vertex without intravenous contrast. Sagittal and coronal MPR images reconstructed from axial data set. COMPARISON:  Earlier exam 12/08/2016 FINDINGS: Brain: Mild generalized atrophy. Stable ventricular morphology. Again identified large falcine subdural hematoma measuring up to 19 mm thick unchanged. Minimal extension onto the LEFT tentorium. Minimal subarachnoid hemorrhage at sulci along the superior aspect of the interhemispheric fissure. No intracranial mass, intracerebral hemorrhage or evidence of acute infarction. Vascular: Unremarkable Skull: Intact Sinuses/Orbits: Minimal mucosal thickening RIGHT maxillary sinus. Other: N/A IMPRESSION: Stable appearance of subdural hematoma along the falx, up to 19 mm thick. Minimal subarachnoid hemorrhage at the interhemispheric fissure. No new intracranial abnormalities. Electronically Signed   By: Ulyses Southward M.D.   On: 12/08/2016 11:33   Portable Chest Xray  Result Date: 11/28/2016 CLINICAL DATA:  Intubation EXAM: PORTABLE CHEST 1 VIEW COMPARISON:  12/06/2016 FINDINGS: Interim intubation, tip of the endotracheal tube is just above the carina. Esophageal tube extends below the diaphragm but is not included. Right lung base is not included. Persistent cardiomegaly with central congestion and moderate interstitial edema. No pneumothorax IMPRESSION: 1. Endotracheal tube tip is just above the carina 2. Cardiomegaly central congestion and moderate interstitial edema  Electronically Signed   By: Jasmine Pang M.D.   On: 11/28/2016 01:00   Dg Chest Port 1 View  Result Date: 12/15/2016 CLINICAL DATA:  Hypoxia. EXAM: PORTABLE CHEST 1 VIEW COMPARISON:  12/05/2016 FINDINGS: Cardiac enlargement is stable. Stable prominent mediastinal and hilar contours. Interstitial and alveolar pulmonary edema pattern without definite pleural effusions. IMPRESSION: Cardiac enlargement and CHF. Electronically Signed   By: Rudie Meyer M.D.   On: 12/03/2016 11:50   Dg Abd Portable 1v  Result Date: 11/28/2016 CLINICAL DATA:  Orogastric tube placement EXAM: PORTABLE ABDOMEN - 1 VIEW COMPARISON:  CT 10/26/2016 FINDINGS: Esophageal tube tip projects over the mid stomach, the side port projects over the gastric fundus. Surgical clips in the right upper quadrant. Bibasilar atelectasis or infiltrates. Upper abdominal gas pattern is nonobstructed. IMPRESSION: Esophageal tube tip projects over the mid stomach Electronically Signed   By: Jasmine Pang M.D.   On: 11/28/2016 01:02     STUDIES:  Echo Jan 2017 > LVEF 35-40% with regional wall motion abnormalities,akinesis of the anteroseptal and apical myocardium CT Head 1/4 > large falcine subdural hematoma measuring up to 19 mm thick unchanged, Minimal subarachnoid hemorrhage at sulci along the superior aspect of the  interhemispheric fissure. No intracranial mass, intracerebral hemorrhage or evidence of acute infarction CXR 1/4 > Interstitial and alveolar pulmonary edema pattern without definite pleural effusions EEG 1/5 >>   CULTURES: UA 1/4 > Neg Urine 1/4 >  Blood 1/4 >  Flu 1/4 > Neg  ANTIBIOTICS: Rocephin 1/4 >1/4 Vancomycin 1/4 > Zosyn 1/4 >   SIGNIFICANT EVENTS: 1/4 > Presents to ED with weakness and headache  1/5 > Intubated   LINES/TUBES:   DISCUSSION: 56 year old male with PMH as written above, presents to ED on 1/4 with weakness of right LE and recent fall. New SDH was identified on CT head. While in ED patient had a  witnessed seizure. Patient reports recent cocaine use last night 1/3. Currently patient is hypotensive, tachycardic, and lethargic, however easy to arouse. To be admitted to ICU with Neurology and Neurosurgery following.   ASSESSMENT / PLAN:  PULMONARY A: Acute on Chronic Respiratory Failure  H/O COPD, OSA (on CPAP at HS)  P:   Vent support Wean as tolerated  Continue home Pulmicort and xopenex  Follow CXR  CARDIOVASCULAR A: Hypotension  CHF  H/O Afib/flutter (on Xarelto), HTN  P: Cardiac Monitoring  Cardene if needed to maintain Systolic <160 Hold home HTN medications and Lasix  PRN metoprolol   RENAL A:   Hypokalemia  P:  Replace Electrolytes as needed Trend BMP  GASTROINTESTINAL A:   At risk for aspiration  P:   NPO Bedside swallow after extubation   HEMATOLOGIC A:  On Chronic Anticoagulation (FFP administered in ED)  P:  Hold anticoagulation in setting of SDH Trend CBC   INFECTIOUS A:   Sepsis of unknown etiology (s/p 1L NS in ED) Elevated Lactic Acid - in setting of seizure (down-trending)  P:   Trend Fever and WBC curve Vanc and Zoysn  Follow Culture Data  Send Tracheal Aspirate  Trend Procal   ENDOCRINE A:   DM P:   SSI  q4H glucose checks  Hold home Lantus   NEUROLOGIC A:   Interhemispheric SDH  -recent falls and weakness to right lower extremity  Status Epilepticus  Recent Cocaine Use  P:   Neurosurgery following  Neurology Following  Trend CT Head  EEG pending  PRN Ativan  Continue Keppra per neurology  RASS goal: 0    FAMILY  - Updates: Patient updated on plan, no family at bedside   - Inter-disciplinary family meet or Palliative Care meeting due by: 1/11    Jovita Kussmaul, AG-ACNP [facility]  Pgr: [phone]  PCCM Pgr: [phone]   ATTENDING NOTE / ATTESTATION NOTE :   I have discussed the case with the resident/APP  Jovita Kussmaul NP.   I agree with the resident/APP's   history, physical examination, assessment, and plans.    I have edited the above note and modified it according to our agreed history, physical examination, assessment and plan.   Briefly, 56 year old male with PMH of DM, COPD, CHF, OSA (on CPAP at HS), atrial flutter (on xarelto), and polysubstance abuse (patient report used cocaine prior to admission), denies ETOH, presented  to ED on 1/3 with one day of progressive weakness to right leg, five day history of headache. Cranial CT scan showed a  large falcine subdural hematoma measuring up to 19 mm thick unchanged, Minimal subarachnoid hemorrhage at sulci along the superior aspect of the interhemispheric fissure. Neurosurgey decided conservative management given location of bleed. Pt had recurrent seizures while admitted at [facility].  Intubated for airway protection on 1/5. No recurrence of sze since being on propofol and other sze meds.   PE as above. Patient seen, comfortable,  sedated. NAD. Blood pressure 104/70, heart rate 110, respiratory rate 20, 95% on 40%. Morbidly obese. Crowded airway. No prominent neck veins. Good air entry. Decreased breath sounds at the bases. Good S1 and S2. Abdomen was benign. No masses. Grade 1 edema. Neuro exam unremarkable. No lateralizing sign. Pt is sedated.   Assessment and Plan : 1. Acute SDH. ICH. szes, Neurosurgery and neurology following. Patient is on chronic Xarelto would recent falls. He received fresh frozen plasma on admission.  Cranial CT scan is stable with no increase in hematoma. No surgery contemplated 2/2 location of bleed and surgery associated with inc complications. Cont sze meds. He is on propofol drip as well. Having EEG done.   2. Acute on Chronic Hypoxemic Hypercapneic resp fx 2/2 unable to protect the airway +/- mild pulm edema. Pt with COPD but is not in exacerbation.  Cont vent support.  PST once szes controlled.  May need diuresis later on.   3. Chronic atrial fibrillation. CHF EF EF 35-40%.  Xarelto on hold. Need to determine whether he needs to be off anticoagulation indefinitely given recent repeated falls.  4. Lactic acidosis. Improved. Likely related to szes.  Will d/c abx.  No obvious source of infection.   I have spent 30  minutes of critical care time with this patient today.  Family :Family updated at length today.  Wife updated at bedside.    Pollie Meyer, MD 11/28/2016, 11:51 AM [facility] Pager [phone] After 3 pm or if no answer, call [phone]

## 2016-11-28 NOTE — Procedures (Signed)
Intubation Procedure Note Jeremiah Perkins [id number] 02-23-1961  Procedure: Intubation Indications: Airway protection and maintenance On Bipap 50%/ +8 on my arrival, unresponsive  Procedure Details Consent: Unable to obtain consent because of emergent medical necessity. Time Out: Verified patient identification, verified procedure, site/side was marked, verified correct patient position, special equipment/implants available, medications/allergies/relevent history reviewed, required imaging and test results available.  Performed  Maximum sterile technique was used including gloves, gown, hand hygiene and mask.  MAC and 4 Gr 2 view with glidescope, only 2 front teeth present  Versed 4 mg in divided doses fent 100 mcg Etomidate 20 Rocuronium 50  Evaluation Hemodynamic Status: BP stable throughout; O2 sats: stable throughout Patient's Current Condition: stable Complications: No apparent complications Patient did tolerate procedure well. Chest X-ray ordered to verify placement.  CXR: pending.   Hadlea Furuya V. 11/28/2016

## 2016-11-28 NOTE — Progress Notes (Signed)
Initial Nutrition Assessment  DOCUMENTATION CODES:   Morbid obesity  INTERVENTION:   Vital High Protein @ 25 ml/hr (600 ml/day) 60 ml Prostat QID Provides: 1400 kcal, 172 grams protein, and 501 ml H2O.  TF regimen and propofol at current rate providing 1875 total kcal/day   NUTRITION DIAGNOSIS:   Inadequate oral intake related to inability to eat as evidenced by NPO status.  GOAL:   Provide needs based on ASPEN/SCCM guidelines  MONITOR:   TF tolerance, Vent status, Labs  REASON FOR ASSESSMENT:   Consult, Ventilator Enteral/tube feeding initiation and management  ASSESSMENT:   56 year old male with PMH of DM, COPD, CHF, OSA (on CPAP at HS), atrial flutter (on xarelto), and polysubstance abuse (patient report used cocaine last night), denies ETOH. Present to ED on 1/3 with new SDH.   Spoke with RN at bedside. Pt on continuous EEG.   Patient is currently intubated on ventilator support MV: 9.3 L/min Temp (24hrs), Avg:99.2 F (37.3 C), Min:98.2 F (36.8 C), Max:101.1 F (38.4 C)  Propofol: 18 ml/hr provides: 475 kcal per day from lipid Nutrition-Focused physical exam completed. Findings are no fat depletion, no muscle depletion, and no edema.   Diet Order:  Diet NPO time specified  Skin:  Reviewed, no issues  Last BM:  unknown  Height:   Ht Readings from Last 1 Encounters:  12/05/2016 5\' 7"  (1.702 m)    Weight:   Wt Readings from Last 1 Encounters:  11/28/16 270 lb 15.1 oz (122.9 kg)    Ideal Body Weight:  67.2 kg  BMI:  Body mass index is 42.44 kg/m.  Estimated Nutritional Needs:   Kcal:  1351-1720  Protein:  >/= 168 grams  Fluid:  > 1.5 L/day  EDUCATION NEEDS:   No education needs identified at this time  Kendell Bane RD, LDN, CNSC [phone] Pager [phone] After Hours Pager

## 2016-11-28 NOTE — Progress Notes (Signed)
I was called to bedside emergently for progressive hypoxia and worsening mental status He had focal seizure previously and was given Ativan 4 mg in divided doses He was on a BiPAP full face mask, 50% and PEEP of 8 Plan was to proceed with head CT It was felt best to intubate him for airway protection and also for hypoxia He was intubated using 2 mg Versed, fentanyl 100 mg IV and etomidate-uneventful procedure Chest x-ray showed ET tube just above the carina and bilateral interstitial infiltrates with some cardiomegaly Neurology was called, head CT was repeated which showed no change in his subfalcine hematoma and mild subarachnoid hemorrhage. He was sedated with propofol and intermittent Fent ABG will be obtained & vent settings adjusted as needed  Additional critical care time x 35 mins, independent of procedures  Oretha Milch. MD

## 2016-11-29 ENCOUNTER — Inpatient Hospital Stay (HOSPITAL_COMMUNITY): Payer: PPO

## 2016-11-29 DIAGNOSIS — I609 Nontraumatic subarachnoid hemorrhage, unspecified: Secondary | ICD-10-CM

## 2016-11-29 LAB — CBC
HEMATOCRIT: 37.4 % — AB (ref 39.0–52.0)
Hemoglobin: 12.3 g/dL — ABNORMAL LOW (ref 13.0–17.0)
MCH: 30.1 pg (ref 26.0–34.0)
MCHC: 32.9 g/dL (ref 30.0–36.0)
MCV: 91.7 fL (ref 78.0–100.0)
PLATELETS: 183 10*3/uL (ref 150–400)
RBC: 4.08 MIL/uL — AB (ref 4.22–5.81)
RDW: 17.1 % — ABNORMAL HIGH (ref 11.5–15.5)
WBC: 9.9 10*3/uL (ref 4.0–10.5)

## 2016-11-29 LAB — GLUCOSE, CAPILLARY
GLUCOSE-CAPILLARY: 177 mg/dL — AB (ref 65–99)
GLUCOSE-CAPILLARY: 172 mg/dL — AB (ref 65–99)
GLUCOSE-CAPILLARY: 194 mg/dL — AB (ref 65–99)
Glucose-Capillary: 158 mg/dL — ABNORMAL HIGH (ref 65–99)
Glucose-Capillary: 214 mg/dL — ABNORMAL HIGH (ref 65–99)
Glucose-Capillary: 187 mg/dL — ABNORMAL HIGH (ref 65–99)

## 2016-11-29 LAB — PROCALCITONIN: PROCALCITONIN: 0.59 ng/mL

## 2016-11-29 LAB — BASIC METABOLIC PANEL
Anion gap: 11 (ref 5–15)
BUN: 20 mg/dL (ref 6–20)
CO2: 25 mmol/L (ref 22–32)
CREATININE: 0.82 mg/dL (ref 0.61–1.24)
Calcium: 8.3 mg/dL — ABNORMAL LOW (ref 8.9–10.3)
Chloride: 103 mmol/L (ref 101–111)
GFR calc non Af Amer: >60 mL/min (ref >60)
Glucose, Bld: 173 mg/dL — ABNORMAL HIGH (ref 65–99)
POTASSIUM: 2.6 mmol/L — AB (ref 3.5–5.1)
SODIUM: 139 mmol/L (ref 135–145)

## 2016-11-29 LAB — MAGNESIUM
Magnesium: 2 mg/dL (ref 1.7–2.4)
Magnesium: 1.8 mg/dL (ref 1.7–2.4)

## 2016-11-29 LAB — PHOSPHORUS
Phosphorus: 3 mg/dL (ref 2.5–4.6)
Phosphorus: 2.8 mg/dL (ref 2.5–4.6)

## 2016-11-29 MED ORDER — SODIUM CHLORIDE 0.9 % IV SOLN
30.0000 meq | Freq: Two times a day (BID) | INTRAVENOUS | Status: DC
  Administered 2016-11-29 – 2016-11-30 (×4): 30 meq via INTRAVENOUS
  Filled 2016-11-29 (×8): qty 15

## 2016-11-29 MED ORDER — POTASSIUM CHLORIDE 20 MEQ/15ML (10%) PO SOLN: 40.0000 meq | Freq: Two times a day (BID) | ORAL | Status: DC

## 2016-11-29 MED ORDER — DEXTROSE 5 % IV SOLN
1.0000 g | INTRAVENOUS | Status: DC
  Administered 2016-11-29 – 2016-12-02 (×4): 1 g via INTRAVENOUS
  Filled 2016-11-29 (×6): qty 10

## 2016-11-29 MED ORDER — FUROSEMIDE 10 MG/ML IJ SOLN
40.0000 mg | Freq: Two times a day (BID) | INTRAMUSCULAR | Status: DC
  Administered 2016-11-29 – 2016-11-30 (×2): 40 mg via INTRAVENOUS
  Filled 2016-11-29 (×2): qty 4

## 2016-11-29 MED ORDER — POTASSIUM CHLORIDE 20 MEQ/15ML (10%) PO SOLN
40.0000 meq | Freq: Once | ORAL | Status: AC
  Administered 2016-11-29: 40 meq via ORAL
  Filled 2016-11-29: qty 30

## 2016-11-29 MED ORDER — SODIUM CHLORIDE 0.9 % IV SOLN: 30.0000 meq | Freq: Once | INTRAVENOUS | Status: DC

## 2016-11-29 NOTE — Progress Notes (Signed)
Received critical potasium value of 2.6 from [employer].  Called CCM & [name] returned call and gave orders for replacement.

## 2016-11-29 NOTE — Progress Notes (Signed)
PULMONARY / CRITICAL CARE MEDICINE   Name: Jeremiah Perkins MRN: [id number] DOB: 1961-04-10    ADMISSION DATE:  12-08-2016 CONSULTATION DATE:  2016/12/08  REFERRING MD:  Dr. Allena Katz   CHIEF COMPLAINT: SDH   HISTORY OF PRESENT ILLNESS:   56 year old male with PMH of DM, COPD, CHF, OSA (on CPAP at HS), atrial flutter (on xarelto), and polysubstance abuse (patient report used cocaine last night), denies ETOH. Present to ED on 1/3 with new  SDH. Transferred to ICU and ultimately required intubation.   SUBJECTIVE:  Unable to wean.Marland Kitchen   VITAL SIGNS: BP 114/63   Pulse 100   Temp (!) 100.4 F (38 C) (Axillary)   Resp 16   Ht 5\' 7"  (1.702 m)   Wt 274 lb 7.6 oz (124.5 kg)   SpO2 98%   BMI 42.99 kg/m   HEMODYNAMICS:    VENTILATOR SETTINGS: Vent Mode: CPAP;PSV FiO2 (%):  [30 %-40 %] 30 % Set Rate:  [18 bmp] 18 bmp Vt Set:  [530 mL] 530 mL PEEP:  [5 cmH20] 5 cmH20 Pressure Support:  [5 cmH20] 5 cmH20 Plateau Pressure:  [13 cmH20-21 cmH20] 20 cmH20  INTAKE / OUTPUT: I/O last 3 completed shifts: In: 2545 [I.V.:896.5; NG/GT:461; IV Piggyback:1187.5] Out: 350 [Urine:350]  PHYSICAL EXAMINATION: General: Adult male, sedated and intubated Neuro: Sedated, not following commands, grimices to painful stimuli  HEENT: Normocephalic  Cardiovascular: tachycardic, afib, no MRG   Lungs: decreased bs bases, when not sedated bucks vent  Abdomen: non-tender, obese, active bs  Musculoskeletal: no deformities Skin: warm, dry, intact     LABS:  BMET  Recent Labs Lab 2016/12/08 0400 12/08/2016 1153 11/28/16 0323  NA 134* 136 140  K 3.1* 2.8* 3.7  CL 93* 95* 101  CO2 29 28 30   BUN 35* 28* 18  CREATININE 1.06 1.02 1.05  GLUCOSE 228* 230* 124*    Electrolytes  Recent Labs Lab December 08, 2016 0400  12/08/2016 1153 11/28/16 0323 11/28/16 1119 11/28/16 1625  CALCIUM 8.9  --  8.7* 8.5*  --   --   MG  --   < > 1.7 1.9 1.9 1.8  PHOS  --   < > 3.8 3.5 2.9 3.3  < > = values in this interval not  displayed.  CBC  Recent Labs Lab 2016/12/08 0400 December 08, 2016 1153 11/28/16 0323  WBC 9.6 12.1* 8.8  HGB 13.5 12.7* 13.0  HCT 41.3 38.0* 40.8  PLT 168 157 163    Coag's  Recent Labs Lab Dec 08, 2016 0400 11/28/16 0323  APTT  --  34  INR 1.80 1.43    Sepsis Markers  Recent Labs Lab 12/08/16 0635 2016/12/08 0812 08-Dec-2016 1153 Dec 08, 2016 1436  LATICACIDVEN 2.14*  --  1.5 1.5  PROCALCITON  --  0.32  --   --     ABG  Recent Labs Lab 11/28/16 0145  PHART 7.420  PCO2ART 44.0  PO2ART 78.1*    Liver Enzymes  Recent Labs Lab 12/08/2016 0400 11/28/16 0323  AST 39 46*  ALT 32 31  ALKPHOS 77 80  BILITOT 1.0 1.3*  ALBUMIN 2.8* 2.7*    Cardiac Enzymes  Recent Labs Lab 12-08-16 1153 2016/12/08 1833 2016/12/08 2354  TROPONINI 0.10* 0.15* 0.13*    Glucose  Recent Labs Lab 11/28/16 1120 11/28/16 1538 11/28/16 1946 11/28/16 2358 11/29/16 0312 11/29/16 0736  GLUCAP 105* 189* 181* 217* 177* 172*    Imaging Dg Chest Port 1 View  Result Date: 11/29/2016 CLINICAL DATA:  Intubated patient.  Subsequent encounter. EXAM: PORTABLE CHEST 1 VIEW COMPARISON:  11/28/2016 FINDINGS: Cardiac silhouette is mildly enlarged. There central vascular congestion and bilateral central interstitial and airspace opacities, with evidence of small pleural effusions. Findings are consistent with congestive heart failure. Endotracheal tube and nasal/ orogastric tube are stable in well positioned. IMPRESSION: Congestive heart failure with no significant change from the previous exam. Stable, well-positioned, support apparatus. Electronically Signed   By: Amie Portland M.D.   On: 11/29/2016 07:11     STUDIES:  Echo Jan 2017 > LVEF 35-40% with regional wall motion abnormalities,akinesis of the anteroseptal and apical myocardium CT Head 1/4 > large falcine subdural hematoma measuring up to 19 mm thick unchanged, Minimal subarachnoid hemorrhage at sulci along the superior aspect of the  interhemispheric fissure. No intracranial mass, intracerebral hemorrhage or evidence of acute infarction CXR 1/4 > Interstitial and alveolar pulmonary edema pattern without definite pleural effusions EEG 1/5 >>   CULTURES: UA 1/4 > Neg Urine 1/4 > neg Blood 1/4 >  Flu 1/4 > Neg  ANTIBIOTICS: Rocephin 1/4 >1/4 Vancomycin 1/4 > Zosyn 1/4 >   SIGNIFICANT EVENTS: 1/4 > Presents to ED with weakness and headache  1/5 > Intubated   LINES/TUBES:   DISCUSSION: 56 year old male with PMH as written above, presents to ED on 1/4 with weakness of right LE and recent fall. New SDH was identified on CT head. While in ED patient had a witnessed seizure. Patient reports recent cocaine use last night 1/3. Currently patient is hypotensive, tachycardic, and lethargic, however easy to arouse. To be admitted to ICU with Neurology and Neurosurgery following.   ASSESSMENT / PLAN:  PULMONARY A: Acute on Chronic Respiratory Failure  H/O COPD, OSA (on CPAP at HS)  pulm edema P:   Vent support Wean as tolerated 1/6 not weanable Continue home Pulmicort and xopenex  Follow CXR  CARDIOVASCULAR A: Hypotension  CHF  H/O Afib/flutter (on Xarelto), HTN  P: Cardiac Monitoring  Cardene if needed to maintain Systolic <160 Hold home HTN medications and Lasix  PRN metoprolol   RENAL  Recent Labs Lab 12/13/2016 0400 12/12/2016 1153 11/28/16 0323  K 3.1* 2.8* 3.7     A:   Hypokalemia  P:  Replace Electrolytes as needed Trend BMP  GASTROINTESTINAL A:   At risk for aspiration  P:   NPO Bedside swallow after extubation   HEMATOLOGIC A:  On Chronic Anticoagulation (FFP administered in ED)  P:  Hold anticoagulation in setting of SDH Trend CBC   INFECTIOUS A:   Sepsis of unknown etiology (s/p 1L NS in ED) Elevated Lactic Acid - in setting of seizure (down-trending)  P:   Trend Fever and WBC curve On Zoysn  Follow Culture Data    ENDOCRINE CBG (last 3)   Recent Labs   11/28/16 2358 11/29/16 0312 11/29/16 0736  GLUCAP 217* 177* 172*     A:   DM P:   SSI  q4H glucose checks  Hold home Lantus   NEUROLOGIC A:   Interhemispheric SDH  -recent falls and weakness to right lower extremity  Status Epilepticus  Recent Cocaine Use  P:   Neurosurgery following  Neurology Following  Trend CT Head  EEG  MRI planned PRN Ativan  Continue Keppra per neurology  RASS goal: 0    FAMILY  - Updates:, no family at bedside   - Inter-disciplinary family meet or Palliative Care meeting due by: 1/11   CCT=30 min   Brett Canales Minor ACNP [facility] Rolls  [place] [facility] PCCM Pager [phone] till 3 pm If no answer page [phone] 11/29/2016, 10:10 AM   STAFF NOTE: I, Rory Percy, MD FACP have personally reviewed patient's available data, including medical history, events of note, physical examination and test results as part of my evaluation. I have discussed with resident/NP and other care providers such as pharmacist, RN and RRT. In addition, I personally evaluated patient and elicited key findings of: grimazing, attempting to communicate?  some movement ext, crackles,. pcxr c/w edema, effusions, ef noted, needs neg balance and diuresis as failed weaning bad, will have neg 2 liters goals, lasix to these goals, K supp, bmet in pm and in am , pcxr mostly c/w edema and NOT pna, narrow off zosyn The patient is critically ill with multiple organ systems failure and requires high complexity decision making for assessment and support, frequent evaluation and titration of therapies, application of advanced monitoring technologies and extensive interpretation of multiple databases.   Critical Care Time devoted to patient care services described in this note is 35 Minutes. This time reflects time of care of this signee: Rory Percy, MD FACP. This critical care time does not reflect procedure time, or teaching time or supervisory time of PA/NP/Med student/Med Resident etc but could  involve care discussion time. Rest per NP/medical resident whose note is outlined above and that I agree with   Mcarthur Rossetti. [name], MD, FACP Pgr: [phone] [facility] 11/29/2016 1:30 PM

## 2016-11-29 NOTE — Progress Notes (Signed)
Subjective: No further seizures overnight.   Exam: Vitals:   11/29/16 0700 11/29/16 0812  BP: 114/63   Pulse: (!) 102 100  Resp: 18 16  Temp:  (!) 100.4 F (38 C)   Gen: In bed, NAD Resp: non-labored breathing, no acute distress Abd: soft, nt  Neuro: MS: awake, does not follow commands.  CN: Does appear to fixate and track but does not fully in either direction.  Motor: clear right hemiparesis, doe snot move right side much to nox stim Sensory: grimaces to nox stim x 4.   Pertinent Labs: Cr 1.05  Impression: 56 yo M with new onset seizures in the setting of SDH. Unclear reason for worsening right sided weakness and aphasia, concern he may have stroke. Will get MRI brain.   Recommendations: 1) d/c EEG 2) continue keppra 1g BID 3) MRI brain 4) will follow.   Ritta Slot, MD [facility] [phone]  If 7pm- 7am, please page neurology on call as listed in AMION.

## 2016-11-29 NOTE — Procedures (Signed)
Continuous Video-EEG Monitoring Report   Patient: Jeremiah Perkins, Jeremiah Perkins     EEG No.ID: [id number]     DOB: January 20, 1961 Age: 56 Room#3M10  MED REC NO: [id number] Gender: Male   TECH: Amy Zwelling     Physician: Ronnie Doss     Referring Physician: Ritta Slot     Report Date: 11/29/2016       Study Duration:         11/28/2016 10:36 to 11/29/2016 12:10 CPT Code:                  09233 Diagnosis:                  Seizures (R56.9)   History: This is a 56 year old male presenting with recurrent seizures and altered mental status.  Video-EEG monitoring was performed to evaluate for seizures.   Technical Details:  Long-term video-EEG monitoring was performed using standard setting per the guidelines.  Briefly, a minimum of 21 electrodes were placed on scalp according to the International 10-20 or/and 10-10 Systems.  Supplemental electrodes were placed as needed.  Single EKG electrode was also used to detect cardiac arrhythmia.  Patient's behavior was continuously recorded on video simultaneously with EEG.  A minimum of 16 channels were used for data display.  Each epoch of study was reviewed manually daily and as needed using standard referential and bipolar montages.     EEG Description:  The background amplitude was attenuated.  Diffuse beta activity was present, most likely due to medication effect (such as benzodiazepine, Propofol).  Intermittent generalized polymorphic delta slowing was seen.  No posterior dominant rhythm or sleep architecture was recorded.  No epileptiform discharges or seizures were in evidence.   Impression:  This is an abnormal EEG due to the presence of generalized polymorphic delta slowing, suggesting severe encephalopathy, but medication effect (such as benzodiazepine, propofol) could not be excluded.  No epileptiform discharges or seizures were in evidence.       Reading Physician: Ronnie Doss, MD, PhD

## 2016-11-29 NOTE — Progress Notes (Signed)
Pt seen and examined. No issues overnight.  EXAM: Temp:  [98.5 F (36.9 C)-101 F (38.3 C)] 100.4 F (38 C) (01/06 0812) Pulse Rate:  [98-134] 100 (01/06 0812) Resp:  [13-26] 16 (01/06 0812) BP: (75-129)/(55-103) 114/63 (01/06 0700) SpO2:  [86 %-100 %] 98 % (01/06 0817) FiO2 (%):  [30 %-40 %] 30 % (01/06 0812) Weight:  [124.5 kg (274 lb 7.6 oz)] 124.5 kg (274 lb 7.6 oz) (01/06 0500) Intake/Output      01/05 0701 - 01/06 0700 01/06 0701 - 01/07 0700   I.V. (mL/kg) 659.4 (5.3)    Blood     NG/GT 461    IV Piggyback 467.5    Total Intake(mL/kg) 1587.9 (12.8)    Urine (mL/kg/hr)     Total Output       Net +1587.9          Urine Occurrence 6 x     Intubated PERRL Withdraws bilaterally Continue seizure monitoring and medical management Small non-operative SDH

## 2016-11-29 NOTE — Progress Notes (Signed)
LTM D/C'd per Dr Kirkpatrick 

## 2016-11-30 ENCOUNTER — Inpatient Hospital Stay (HOSPITAL_COMMUNITY): Payer: PPO

## 2016-11-30 LAB — CBC
HEMATOCRIT: 40.5 % (ref 39.0–52.0)
Hemoglobin: 12.7 g/dL — ABNORMAL LOW (ref 13.0–17.0)
MCH: 29.8 pg (ref 26.0–34.0)
MCHC: 31.4 g/dL (ref 30.0–36.0)
MCV: 95.1 fL (ref 78.0–100.0)
PLATELETS: 159 10*3/uL (ref 150–400)
RBC: 4.26 MIL/uL (ref 4.22–5.81)
RDW: 17.4 % — ABNORMAL HIGH (ref 11.5–15.5)
WBC: 11.5 10*3/uL — ABNORMAL HIGH (ref 4.0–10.5)

## 2016-11-30 LAB — URINALYSIS, ROUTINE W REFLEX MICROSCOPIC
Bacteria, UA: NONE SEEN
Bilirubin Urine: NEGATIVE
GLUCOSE, UA: 150 mg/dL — AB
Ketones, ur: NEGATIVE mg/dL
Nitrite: NEGATIVE
PH: 5 (ref 5.0–8.0)
PROTEIN: 30 mg/dL — AB
SPECIFIC GRAVITY, URINE: 1.02 (ref 1.005–1.030)
Squamous Epithelial / LPF: NONE SEEN

## 2016-11-30 LAB — BASIC METABOLIC PANEL
ANION GAP: 13 (ref 5–15)
BUN: 31 mg/dL — AB (ref 6–20)
CHLORIDE: 105 mmol/L (ref 101–111)
CO2: 27 mmol/L (ref 22–32)
Calcium: 8.6 mg/dL — ABNORMAL LOW (ref 8.9–10.3)
Creatinine, Ser: 1.13 mg/dL (ref 0.61–1.24)
GFR calc Af Amer: >60 mL/min (ref >60)
GLUCOSE: 205 mg/dL — AB (ref 65–99)
POTASSIUM: 3.5 mmol/L (ref 3.5–5.1)
Sodium: 145 mmol/L (ref 135–145)

## 2016-11-30 LAB — GLUCOSE, CAPILLARY
GLUCOSE-CAPILLARY: 235 mg/dL — AB (ref 65–99)
GLUCOSE-CAPILLARY: 199 mg/dL — AB (ref 65–99)
GLUCOSE-CAPILLARY: 231 mg/dL — AB (ref 65–99)
GLUCOSE-CAPILLARY: 189 mg/dL — AB (ref 65–99)
Glucose-Capillary: 168 mg/dL — ABNORMAL HIGH (ref 65–99)
Glucose-Capillary: 246 mg/dL — ABNORMAL HIGH (ref 65–99)

## 2016-11-30 LAB — CULTURE, RESPIRATORY W GRAM STAIN

## 2016-11-30 LAB — CULTURE, RESPIRATORY: CULTURE: NO GROWTH

## 2016-11-30 LAB — MAGNESIUM: Magnesium: 1.7 mg/dL (ref 1.7–2.4)

## 2016-11-30 LAB — PHOSPHORUS: Phosphorus: 3.7 mg/dL (ref 2.5–4.6)

## 2016-11-30 MED ORDER — SODIUM CHLORIDE 0.9 % IV SOLN
2000.0000 mg | Freq: Once | INTRAVENOUS | Status: AC
  Administered 2016-11-30: 2000 mg via INTRAVENOUS
  Filled 2016-11-30: qty 40

## 2016-11-30 MED ORDER — PHENYTOIN SODIUM 50 MG/ML IJ SOLN
100.0000 mg | Freq: Three times a day (TID) | INTRAMUSCULAR | Status: DC
  Administered 2016-11-30 – 2016-12-11 (×33): 100 mg via INTRAVENOUS
  Filled 2016-11-30 (×33): qty 2

## 2016-11-30 MED ORDER — CHLORHEXIDINE GLUCONATE 0.12 % MT SOLN
OROMUCOSAL | Status: AC
  Administered 2016-11-30: 15 mL via OROMUCOSAL
  Filled 2016-11-30: qty 15

## 2016-11-30 NOTE — Progress Notes (Signed)
Subjective: Continues to have right hemiparesis and aphasia.   Exam: Vitals:   11/30/16 0800 11/30/16 0900  BP: 103/82 124/69  Pulse: 72 (!) 112  Resp: (!) 30 (!) 31  Temp: 99.1 F (37.3 C)    Gen: In bed, NAD Resp: non-labored breathing, no acute distress Abd: soft, nt  Neuro: MS: awake, does not follow commands.  CN: Does appear to fixate and track but does not fully in either direction.  Motor: clear right hemiparesis, doe snot move right side much to nox stim Sensory: grimaces to nox stim x 4.   Impression: 56 yo M with new onset seizures in the setting of SDH, now with right sided hemiparesis and aphasia. He had a post-ictal aphasia and hemiparesis initially, but this cleared after the initial event. Now with two days of right hemiparesis and aphasia. I do wonder about a deep midline partial seizure focus not seen on surface EEG, may be worthwhile to try empiric dilantin, but this is a diagnostic as much as theraputic trial.   Recommendations: 1) fosphenytoin 20 PE/kg, then dilantin 100mg  TID 2) continue keppra 1g BID 3) will follow.   Ritta Slot, MD [facility] [phone]  If 7pm- 7am, please page neurology on call as listed in AMION.

## 2016-11-30 NOTE — Progress Notes (Signed)
Patient transported from 3M10 to MRI and back with no complications. 

## 2016-11-30 NOTE — Progress Notes (Signed)
PULMONARY / CRITICAL CARE MEDICINE   Name: Jeremiah Perkins MRN: [id number] DOB: [birth year]    ADMISSION DATE:  12/07/2016 CONSULTATION DATE:  12/17/2016  REFERRING MD:  Dr. Allena Katz   CHIEF COMPLAINT: SDH   HISTORY OF PRESENT ILLNESS:   56 year old male with PMH of DM, COPD, CHF, OSA (on CPAP at HS), atrial flutter (on xarelto), and polysubstance abuse (patient report used cocaine last night), denies ETOH. Present to ED on 1/3 with new  SDH. Transferred to ICU and ultimately required intubation.   SUBJECTIVE:  Unable to wean.Marland Kitchen   VITAL SIGNS: BP 103/82 (BP Location: Right Arm)   Pulse 72   Temp 99.1 F (37.3 C) (Axillary)   Resp (!) 30   Ht 5\' 7"  (1.702 m)   Wt 264 lb 8.8 oz (120 kg)   SpO2 100%   BMI 41.43 kg/m   HEMODYNAMICS:    VENTILATOR SETTINGS: Vent Mode: CPAP;PSV FiO2 (%):  [30 %] 30 % Set Rate:  [18 bmp] 18 bmp Vt Set:  [530 mL] 530 mL PEEP:  [5 cmH20] 5 cmH20 Pressure Support:  [5 cmH20-10 cmH20] 10 cmH20 Plateau Pressure:  [13 cmH20-21 cmH20] 13 cmH20  INTAKE / OUTPUT: I/O last 3 completed shifts: In: 3019 [I.V.:1009; NG/GT:900; IV Piggyback:1110] Out: 3750 [Urine:3750]  PHYSICAL EXAMINATION: General: Adult male, sedated and intubated Neuro: Sedated, not following commands, grimices to painful stimuli , rt arm flaccid  HEENT: Normocephalic  Cardiovascular: tachycardic, afib, no MRG   Lungs: decreased bs bases, when not sedated bucks vent  Abdomen: non-tender, obese, active bs  Musculoskeletal: no deformities Skin: warm, dry, intact     LABS:  BMET  Recent Labs Lab 11/28/16 0323 11/29/16 1019 11/30/16 0600  NA 140 139 145  K 3.7 2.6* 3.5  CL 101 103 105  CO2 30 25 27   BUN 18 20 31*  CREATININE 1.05 0.82 1.13  GLUCOSE 124* 173* 205*    Electrolytes  Recent Labs Lab 11/28/16 0323  11/29/16 1019 11/29/16 1827 11/30/16 0600  CALCIUM 8.5*  --  8.3*  --  8.6*  MG 1.9  < > 2.0 1.8 1.7  PHOS 3.5  < > 3.0 2.8 3.7  < > = values in this  interval not displayed.  CBC  Recent Labs Lab 11/28/16 0323 11/29/16 1019 11/30/16 0600  WBC 8.8 9.9 11.5*  HGB 13.0 12.3* 12.7*  HCT 40.8 37.4* 40.5  PLT 163 183 159    Coag's  Recent Labs Lab 11/26/2016 0400 11/28/16 0323  APTT  --  34  INR 1.80 1.43    Sepsis Markers  Recent Labs Lab 12/19/2016 0635 12/22/2016 0812 11/25/2016 1153 11/26/2016 1436 11/29/16 1019  LATICACIDVEN 2.14*  --  1.5 1.5  --   PROCALCITON  --  0.32  --   --  0.59    ABG  Recent Labs Lab 11/28/16 0145  PHART 7.420  PCO2ART 44.0  PO2ART 78.1*    Liver Enzymes  Recent Labs Lab 12/12/2016 0400 11/28/16 0323  AST 39 46*  ALT 32 31  ALKPHOS 77 80  BILITOT 1.0 1.3*  ALBUMIN 2.8* 2.7*    Cardiac Enzymes  Recent Labs Lab 11/26/2016 1153 12/12/2016 1833 12/22/2016 2354  TROPONINI 0.10* 0.15* 0.13*    Glucose  Recent Labs Lab 11/29/16 1154 11/29/16 1553 11/29/16 2011 11/29/16 2317 11/30/16 0405 11/30/16 0750  GLUCAP 158* 194* 214* 187* 235* 168*    Imaging Mr Brain Wo Contrast  Result Date: 11/30/2016 CLINICAL DATA:  55  y/o M; new onset seizures in the setting of subdural hematoma with worsening right-sided weakness and a aphasia. EXAM: MRI HEAD WITHOUT CONTRAST TECHNIQUE: Multiplanar, multiecho pulse sequences of the brain and surrounding structures were obtained without intravenous contrast. COMPARISON:  11/29/2015 CT head. FINDINGS: Extensive motion degradation of multiple pulse sequences. Brain: Diffusion hyperintensity at the margins of the subdural hematoma along the falx is likely due to the presence of subdural and subarachnoid blood products. No diffusion restriction to suggest acute or early subacute infarct of the brain parenchyma. In comparison with the prior CT of head given differences in technique the subdural hematoma centered along the falx extending along the left tentorium cerebelli and was surrounding bilateral parafalcine subarachnoid hemorrhage is stable.  Incomplete FLAIR suppression of parafalcine sulci due to the presence of subarachnoid hemorrhage. Scattered foci of T2 FLAIR hyperintensity in subcortical and periventricular white matter compatible with mild chronic microvascular ischemic changes. There is mild diffuse brain parenchymal volume loss. Susceptibility hypointensity asymmetrically prominent within the left greater than right globus pallidus with faint density on CT probably represents mineralization, stable on prior CTs. Vascular: Grossly maintained. Skull and upper cervical spine: Unremarkable. Sinuses/Orbits: Sphenoid and right maxillary paranasal sinus disease. Trace right mastoid effusion. Orbits are unremarkable. Other: None. IMPRESSION: 1. Extensive motion degradation of multiple pulse sequences. 2. No diffusion restriction to suggest acute or early subacute infarct of the brain. 3. Stable parafalcine and left tentorium subdural hematoma as well as parafalcine subarachnoid hemorrhage. 4. Mild chronic microvascular ischemic changes of the brain and parenchymal volume loss. Electronically Signed   By: Mitzi Hansen M.D.   On: 11/30/2016 03:57   Dg Chest Port 1 View  Result Date: 11/30/2016 CLINICAL DATA:  Respiratory failure EXAM: PORTABLE CHEST 1 VIEW COMPARISON:  11/29/2016 FINDINGS: Cardiac shadow is enlarged. Endotracheal tube and nasogastric catheter are stable. Diffuse changes of congestive failure are noted. No focal confluent infiltrate is seen. No bony abnormality is noted. IMPRESSION: Persistent CHF. Electronically Signed   By: Alcide Clever M.D.   On: 11/30/2016 08:25     STUDIES:  Echo Jan 2017 > LVEF 35-40% with regional wall motion abnormalities,akinesis of the anteroseptal and apical myocardium CT Head 1/4 > large falcine subdural hematoma measuring up to 19 mm thick unchanged, Minimal subarachnoid hemorrhage at sulci along the superior aspect of the interhemispheric fissure. No intracranial mass,  intracerebral hemorrhage or evidence of acute infarction CXR 1/4 > Interstitial and alveolar pulmonary edema pattern without definite pleural effusions EEG 1/5 >>   CULTURES: UA 1/4 > Neg Urine 1/4 > neg Blood 1/4 >  Flu 1/4 > Neg  ANTIBIOTICS: Rocephin 1/4 >1/4   1/6>>> Vancomycin 1/4 >1/7 Zosyn 1/4 > 1/7  SIGNIFICANT EVENTS: 1/4 > Presents to ED with weakness and headache  1/5 > Intubated   LINES/TUBES:   DISCUSSION: 56 year old male with PMH as written above, presents to ED on 1/4 with weakness of right LE and recent fall. New SDH was identified on CT head. While in ED patient had a witnessed seizure. Patient reports recent cocaine use last night 1/3. Currently patient is hypotensive, tachycardic, and lethargic, however easy to arouse. To be admitted to ICU with Neurology and Neurosurgery following.   ASSESSMENT / PLAN:  PULMONARY A: Acute on Chronic Respiratory Failure  H/O COPD, OSA (on CPAP at HS)  pulm edema P:   Vent support Wean as tolerated 1/7 not weanable Continue home Pulmicort and xopenex  Follow CXR He may need trach due to  body habitus.  CARDIOVASCULAR A: Hypotension  CHF  H/O Afib/flutter (on Xarelto), HTN  P: Cardiac Monitoring  Cardene if needed to maintain Systolic <160 Hold home HTN medications and Lasix  PRN metoprolol   RENAL  Recent Labs Lab 11/28/16 0323 11/29/16 1019 11/30/16 0600  K 3.7 2.6* 3.5     A:   Hypokalemia  P:  Replace Electrolytes as needed Trend BMP  GASTROINTESTINAL A:   At risk for aspiration  P:   NPO Bedside swallow after extubation   HEMATOLOGIC A:  On Chronic Anticoagulation (FFP administered in ED)  P:  Hold anticoagulation in setting of SDH Trend CBC   INFECTIOUS A:   Sepsis of unknown etiology (s/p 1L NS in ED) Elevated Lactic Acid - in setting of seizure (down-trending)  P:   Trend Fever and WBC curve Follow Culture Data   ENDOCRINE CBG (last 3)   Recent Labs  11/29/16 2317  11/30/16 0405 11/30/16 0750  GLUCAP 187* 235* 168*     A:   DM P:   SSI  q4H glucose checks  Hold home Lantus   NEUROLOGIC A:   Interhemispheric SDH  -recent falls and weakness to right lower extremity  R side weakness Status Epilepticus  Recent Cocaine Use  P:   Neurosurgery following  Neurology Following  Trend CT Head  EEG  MRI NSC 1/7 PRN Ativan  Continue Keppra per neurology  RASS goal: 0    FAMILY  - Updates:, no family at bedside   - Inter-disciplinary family meet or Palliative Care meeting due by: 1/11   CCT=30 min   Brett Canales Minor ACNP [name] PCCM Pager [phone] till 3 pm If no answer page [phone] 11/30/2016, 8:54 AM  STAFF NOTE: I, Rory Percy, MD FACP have personally reviewed patient's available data, including medical history, events of note, physical examination and test results as part of my evaluation. I have discussed with resident/NP and other care providers such as pharmacist, RN and RRT. In addition, I personally evaluated patient and elicited key findings of: rass -2, crackles lungs, pcxr c/w int edema / effusions, edema present, was neg with lasix but crt rise, reluctant to push aggressive diuresis, on min O2 needs, weaning done failed after a while, rise PS as needed, eeg done no focus, repeat imaging per neuro, TF on board, may need cvpp assessment for volume status, on ceftriaxone, maintain had fever, next head ct would add chest, ua needed The patient is critically ill with multiple organ systems failure and requires high complexity decision making for assessment and support, frequent evaluation and titration of therapies, application of advanced monitoring technologies and extensive interpretation of multiple databases.   Critical Care Time devoted to patient care services described in this note is 30 Minutes. This time reflects time of care of this signee: Rory Percy, MD FACP. This critical care time does not reflect procedure  time, or teaching time or supervisory time of PA/NP/Med student/Med Resident etc but could involve care discussion time. Rest per NP/medical resident whose note is outlined above and that I agree with   Mcarthur Rossetti. [name], MD, FACP Pgr: [phone] [facility] 11/30/2016 2:08 PM

## 2016-11-30 NOTE — Progress Notes (Signed)
Pt seen and examined. No issues overnight.  EXAM: Temp:  [98.4 F (36.9 C)-100.4 F (38 C)] 99.1 F (37.3 C) (01/07 0800) Pulse Rate:  [49-150] 112 (01/07 0900) Resp:  [0-34] 31 (01/07 0900) BP: (78-146)/(59-117) 124/69 (01/07 0900) SpO2:  [78 %-100 %] 98 % (01/07 0900) FiO2 (%):  [30 %] 30 % (01/07 0800) Weight:  [120 kg (264 lb 8.8 oz)] 120 kg (264 lb 8.8 oz) (01/07 0413) Intake/Output      01/06 0701 - 01/07 0700 01/07 0701 - 01/08 0700   I.V. (mL/kg) 645 (5.4) 38 (0.3)   NG/GT 600 50   IV Piggyback 900    Total Intake(mL/kg) 2145 (17.9) 88 (0.7)   Urine (mL/kg/hr) 3750 (1.3) 160 (0.4)   Stool 0 (0)    Total Output 3750 160   Net -1605 -72        Urine Occurrence 1 x    Stool Occurrence 2 x     Intubated and sedated PERRL Withdraws L>>R  Unchanged Continue current care

## 2016-12-01 ENCOUNTER — Inpatient Hospital Stay (HOSPITAL_COMMUNITY): Payer: PPO

## 2016-12-01 ENCOUNTER — Encounter (HOSPITAL_COMMUNITY): Payer: Self-pay | Admitting: *Deleted

## 2016-12-01 LAB — GLUCOSE, CAPILLARY
GLUCOSE-CAPILLARY: 196 mg/dL — AB (ref 65–99)
GLUCOSE-CAPILLARY: 240 mg/dL — AB (ref 65–99)
GLUCOSE-CAPILLARY: 249 mg/dL — AB (ref 65–99)
GLUCOSE-CAPILLARY: 249 mg/dL — AB (ref 65–99)
GLUCOSE-CAPILLARY: 272 mg/dL — AB (ref 65–99)
Glucose-Capillary: 166 mg/dL — ABNORMAL HIGH (ref 65–99)
Glucose-Capillary: 177 mg/dL — ABNORMAL HIGH (ref 65–99)
Glucose-Capillary: 258 mg/dL — ABNORMAL HIGH (ref 65–99)
Glucose-Capillary: 256 mg/dL — ABNORMAL HIGH (ref 65–99)
Glucose-Capillary: 277 mg/dL — ABNORMAL HIGH (ref 65–99)

## 2016-12-01 LAB — BASIC METABOLIC PANEL
ANION GAP: 8 (ref 5–15)
Anion gap: 11 (ref 5–15)
BUN: 44 mg/dL — ABNORMAL HIGH (ref 6–20)
BUN: 47 mg/dL — ABNORMAL HIGH (ref 6–20)
CALCIUM: 8.4 mg/dL — AB (ref 8.9–10.3)
CHLORIDE: 112 mmol/L — AB (ref 101–111)
CO2: 26 mmol/L (ref 22–32)
CO2: 28 mmol/L (ref 22–32)
CREATININE: 1.16 mg/dL (ref 0.61–1.24)
CREATININE: 1.18 mg/dL (ref 0.61–1.24)
Calcium: 8.4 mg/dL — ABNORMAL LOW (ref 8.9–10.3)
Chloride: 110 mmol/L (ref 101–111)
GFR calc Af Amer: >60 mL/min (ref >60)
GFR calc non Af Amer: >60 mL/min (ref >60)
GFR calc non Af Amer: >60 mL/min (ref >60)
GLUCOSE: 192 mg/dL — AB (ref 65–99)
Glucose, Bld: 289 mg/dL — ABNORMAL HIGH (ref 65–99)
POTASSIUM: 3.7 mmol/L (ref 3.5–5.1)
Potassium: 3.4 mmol/L — ABNORMAL LOW (ref 3.5–5.1)
SODIUM: 148 mmol/L — AB (ref 135–145)
Sodium: 147 mmol/L — ABNORMAL HIGH (ref 135–145)

## 2016-12-01 LAB — CBC
HEMATOCRIT: 42 % (ref 39.0–52.0)
Hemoglobin: 12.9 g/dL — ABNORMAL LOW (ref 13.0–17.0)
MCH: 29.4 pg (ref 26.0–34.0)
MCHC: 30.7 g/dL (ref 30.0–36.0)
MCV: 95.7 fL (ref 78.0–100.0)
Platelets: 217 10*3/uL (ref 150–400)
RBC: 4.39 MIL/uL (ref 4.22–5.81)
RDW: 17.3 % — AB (ref 11.5–15.5)
WBC: 10.3 10*3/uL (ref 4.0–10.5)

## 2016-12-01 LAB — MAGNESIUM: Magnesium: 2 mg/dL (ref 1.7–2.4)

## 2016-12-01 LAB — PHOSPHORUS: Phosphorus: 3.4 mg/dL (ref 2.5–4.6)

## 2016-12-01 LAB — PHENYTOIN LEVEL, TOTAL: PHENYTOIN LVL: 14 ug/mL (ref 10.0–20.0)

## 2016-12-01 LAB — PROCALCITONIN: PROCALCITONIN: 2.05 ng/mL

## 2016-12-01 LAB — TRIGLYCERIDES: TRIGLYCERIDES: 89 mg/dL (ref <150)

## 2016-12-01 MED ORDER — FUROSEMIDE 10 MG/ML IJ SOLN
60.0000 mg | Freq: Two times a day (BID) | INTRAMUSCULAR | Status: DC
  Administered 2016-12-01 – 2016-12-02 (×3): 60 mg via INTRAVENOUS
  Filled 2016-12-01 (×2): qty 6

## 2016-12-01 MED ORDER — INSULIN REGULAR HUMAN 100 UNIT/ML IJ SOLN
INTRAMUSCULAR | Status: DC
  Administered 2016-12-01: 1.9 [IU]/h via INTRAVENOUS
  Administered 2016-12-02: 2.3 [IU]/h via INTRAVENOUS
  Filled 2016-12-01 (×2): qty 2.5

## 2016-12-01 MED ORDER — SODIUM CHLORIDE 0.9 % IV SOLN
30.0000 meq | INTRAVENOUS | Status: AC
Start: 2016-12-01 — End: 2016-12-01
  Administered 2016-12-01 (×2): 30 meq via INTRAVENOUS
  Filled 2016-12-01 (×3): qty 15

## 2016-12-01 NOTE — Progress Notes (Signed)
Subjective: Continues to have right hemiparesis and aphasia.   Exam: Vitals:   12/01/16 0800 12/01/16 0900  BP: 108/82 (!) 129/91  Pulse: (!) 122 (!) 129  Resp: (!) 22 (!) 25  Temp: 99.6 F (37.6 C)    Gen: In bed, NAD Resp: Still intubated,non-labored breathing, no acute distress Abd: soft, nt  Neuro: MS: awake, does not follow commands.  CN: Does appear to fixate and track but consistent Motor: clear right hemiparesis, doe snot move right side much to nox stim, L>R Sensory: grimaces to nox stim x 4.   Impression: 56 yo M with new onset seizures in the setting of SDH, now with right sided hemiparesis and aphasia. He had a post-ictal aphasia and hemiparesis initially, but this cleared after the initial event. Now with persistent right hemiparesis and aphasia.   Recommendations: 1) Continue dilantin 100mg  TID, exam not improved, please get dilantin level  2) continue keppra 1g BID 3) Still shows b/l pulm edema from CHF on CXR which can be contributing to his toxic/metabolic encephalopathy   4) MRI does not show any signs of infract  5) do not believe that SDH is responsible for his symptoms 6) Would recommend continuous EEG, if that is negative, may consider MRI C-spine or repeat brain MRI in 2/3 days

## 2016-12-01 NOTE — Progress Notes (Signed)
vLTM EEG running. No skin breakdown with hookup. notified Neuro

## 2016-12-01 NOTE — Progress Notes (Signed)
PULMONARY / CRITICAL CARE MEDICINE   Name: LEAM SAYE MRN: [id number] DOB: 10-20-1961    ADMISSION DATE:  12/10/2016 CONSULTATION DATE:  12/12/2016  REFERRING MD:  Dr. Allena Katz   CHIEF COMPLAINT: SDH   HISTORY OF PRESENT ILLNESS:   56 year old male with PMH of DM, COPD, CHF, OSA (on CPAP at HS), atrial flutter (on xarelto), and polysubstance abuse (patient report used cocaine last night), denies ETOH. Present to ED on 1/3 with new  SDH. Transferred to ICU and ultimately required intubation.   SUBJECTIVE:  No major neurochanges pcxr improved Pos 578 cc  VITAL SIGNS: BP 98/76   Pulse 63   Temp 99.6 F (37.6 C) (Axillary)   Resp (!) 23   Ht 5\' 7"  (1.702 m)   Wt 119.1 kg (262 lb 9.1 oz)   SpO2 97%   BMI 41.12 kg/m   HEMODYNAMICS:    VENTILATOR SETTINGS: Vent Mode: PSV;CPAP FiO2 (%):  [30 %] 30 % Set Rate:  [18 bmp] 18 bmp Vt Set:  [530 mL] 530 mL PEEP:  [5 cmH20] 5 cmH20 Pressure Support:  [12 cmH20] 12 cmH20 Plateau Pressure:  [16 cmH20-19 cmH20] 19 cmH20  INTAKE / OUTPUT: I/O last 3 completed shifts: In: 3451.7 [I.V.:906.7; Other:10; NG/GT:905; IV Piggyback:1630] Out: 3400 [Urine:3250; Stool:150]  PHYSICAL EXAMINATION: General: Adult male, sedated and intubated Neuro: Sedated, not following commands, mild grimicing to painful stimuli , rt arm flaccid , left upper movving, opening eyes more HEENT: jvd up Cardiovascular:  s1 s2 IRT  Lungs: decreased Abdomen: non-tender, obese, active bs  Musculoskeletal: no deformities Skin: warm, dry, intact     LABS:  BMET  Recent Labs Lab 11/29/16 1019 11/30/16 0600 12/01/16 0510  NA 139 145 147*  K 2.6* 3.5 3.4*  CL 103 105 110  CO2 25 27 26   BUN 20 31* 44*  CREATININE 0.82 1.13 1.16  GLUCOSE 173* 205* 192*    Electrolytes  Recent Labs Lab 11/29/16 1019 11/29/16 1827 11/30/16 0600 12/01/16 0510  CALCIUM 8.3*  --  8.6* 8.4*  MG 2.0 1.8 1.7 2.0  PHOS 3.0 2.8 3.7 3.4    CBC  Recent Labs Lab  11/29/16 1019 11/30/16 0600 12/01/16 0510  WBC 9.9 11.5* 10.3  HGB 12.3* 12.7* 12.9*  HCT 37.4* 40.5 42.0  PLT 183 159 217    Coag's  Recent Labs Lab 11/25/2016 0400 11/28/16 0323  APTT  --  34  INR 1.80 1.43    Sepsis Markers  Recent Labs Lab 11/25/2016 0635 11/24/2016 0812 12/14/2016 1153 12/24/2016 1436 11/29/16 1019 12/01/16 0510  LATICACIDVEN 2.14*  --  1.5 1.5  --   --   PROCALCITON  --  0.32  --   --  0.59 2.05    ABG  Recent Labs Lab 11/28/16 0145  PHART 7.420  PCO2ART 44.0  PO2ART 78.1*    Liver Enzymes  Recent Labs Lab 11/24/2016 0400 11/28/16 0323  AST 39 46*  ALT 32 31  ALKPHOS 77 80  BILITOT 1.0 1.3*  ALBUMIN 2.8* 2.7*    Cardiac Enzymes  Recent Labs Lab 12/13/2016 1153 12/19/2016 1833 12/14/2016 2354  TROPONINI 0.10* 0.15* 0.13*    Glucose  Recent Labs Lab 11/30/16 1135 11/30/16 1700 11/30/16 1931 11/30/16 2318 12/01/16 0348 12/01/16 0743  GLUCAP 199* 246* 231* 189* 166* 177*    Imaging Dg Chest Port 1 View  Result Date: 12/01/2016 CLINICAL DATA:  Intubation. EXAM: PORTABLE CHEST 1 VIEW COMPARISON:  11/30/2016. FINDINGS: Endotracheal tube and  NG tube in stable position. Cardiomegaly with bilateral pulmonary interstitial prominence consistent with CHF. No change from prior exam. Small bilateral pleural effusions. No pneumothorax. IMPRESSION: 1.  Lines and tubes in stable position. 2. Congestive heart failure bilateral from interstitial edema. No change from prior exam. Electronically Signed   By: Maisie Fus  Register   On: 12/01/2016 07:39     STUDIES:  Echo Jan 2017 > LVEF 35-40% with regional wall motion abnormalities,akinesis of the anteroseptal and apical myocardium CT Head 1/4 > large falcine subdural hematoma measuring up to 19 mm thick unchanged, Minimal subarachnoid hemorrhage at sulci along the superior aspect of the interhemispheric fissure. No intracranial mass, intracerebral hemorrhage or evidence of acute infarction CXR 1/4  > Interstitial and alveolar pulmonary edema pattern without definite pleural effusions EEG 1/5 >>   CULTURES: UA 1/4 > Neg Urine 1/4 > neg Blood 1/4 >  Flu 1/4 > Neg  ANTIBIOTICS: Rocephin 1/4 >1/4   1/6>>> Vancomycin 1/4 >1/7 Zosyn 1/4 > 1/7  SIGNIFICANT EVENTS: 1/4 > Presents to ED with weakness and headache  1/5 > Intubated   LINES/TUBES:   DISCUSSION: 56 year old male with PMH as written above, presents to ED on 1/4 with weakness of right LE and recent fall. New SDH was identified on CT head. While in ED patient had a witnessed seizure. Patient reports recent cocaine use last night 1/3. Currently patient is hypotensive, tachycardic, and lethargic, however easy to arouse. To be admitted to ICU with Neurology and Neurosurgery following.   ASSESSMENT / PLAN:  PULMONARY A: Acute on Chronic Respiratory Failure  H/O COPD, OSA (on CPAP at HS)  pulm edema P:   Vent support Escalate lasix to neg balance PS 15 required , weaning on this Must have further neg balance to have success here pcxr in am  Control HR as will lead to more diast HF  CARDIOVASCULAR A: Hypotension  CHF  H/O Afib/flutter (on Xarelto), HTN  P: Cardiac Monitoring If hr control needed, would NOT use cardene, would use cardizem PRN metoprolol  Lasix needed  RENAL  Recent Labs Lab 11/29/16 1019 11/30/16 0600 12/01/16 0510  K 2.6* 3.5 3.4*    A:   Hypokalemia, mild hypernatremia P:  Lasix needed, allow na to rise in setting risk brain edema BMP in pm and am  k supp NO free water  GASTROINTESTINAL A:   At risk for aspiration  dysphagia P:   TF ppi  HEMATOLOGIC A:  On Chronic Anticoagulation (FFP administered in ED)  P:  scd  INFECTIOUS A:   Elevated Lactic Acid - in setting of seizure (down-trending)  Asp? P:   Remains culture neg Ceftriaxone mainatin for now, pct noted  ENDOCRINE CBG (last 3)   Recent Labs  11/30/16 2318 12/01/16 0348 12/01/16 0743  GLUCAP 189*  166* 177*     A:   DM, controlled P:   SSI  q4H glucose checks  Hold home Lantus   NEUROLOGIC A:   Interhemispheric SDH  -recent falls and weakness to right lower extremity  R side weakness Status Epilepticus not seen on eeg Recent Cocaine Use  P:   EEG -enceph MRI NSC 1/7 PRN Ativan  Continue Keppra per neurology  RASS goal: 0 Prop with WUA   FAMILY  - Updates:, no family at bedside   - Inter-disciplinary family meet or Palliative Care meeting due by: 1/11   CCT=30 min  Mcarthur Rossetti. [name], MD, FACP Pgr: [phone] [facility]  12/01/2016 9:08 AM

## 2016-12-02 ENCOUNTER — Inpatient Hospital Stay (HOSPITAL_COMMUNITY): Payer: PPO

## 2016-12-02 ENCOUNTER — Ambulatory Visit: Payer: Self-pay

## 2016-12-02 LAB — BASIC METABOLIC PANEL
ANION GAP: 8 (ref 5–15)
ANION GAP: 9 (ref 5–15)
BUN: 52 mg/dL — ABNORMAL HIGH (ref 6–20)
BUN: 66 mg/dL — AB (ref 6–20)
CHLORIDE: 116 mmol/L — AB (ref 101–111)
CO2: 32 mmol/L (ref 22–32)
CO2: 28 mmol/L (ref 22–32)
Calcium: 8.7 mg/dL — ABNORMAL LOW (ref 8.9–10.3)
Calcium: 8.7 mg/dL — ABNORMAL LOW (ref 8.9–10.3)
Chloride: 114 mmol/L — ABNORMAL HIGH (ref 101–111)
Creatinine, Ser: 1.14 mg/dL (ref 0.61–1.24)
Creatinine, Ser: 1.29 mg/dL — ABNORMAL HIGH (ref 0.61–1.24)
GFR calc Af Amer: >60 mL/min (ref >60)
GFR calc non Af Amer: >60 mL/min (ref >60)
GFR calc non Af Amer: 60 mL/min — ABNORMAL LOW (ref >60)
Glucose, Bld: 123 mg/dL — ABNORMAL HIGH (ref 65–99)
Glucose, Bld: 346 mg/dL — ABNORMAL HIGH (ref 65–99)
POTASSIUM: 3.1 mmol/L — AB (ref 3.5–5.1)
POTASSIUM: 3.6 mmol/L (ref 3.5–5.1)
SODIUM: 154 mmol/L — AB (ref 135–145)
Sodium: 153 mmol/L — ABNORMAL HIGH (ref 135–145)

## 2016-12-02 LAB — GLUCOSE, CAPILLARY
GLUCOSE-CAPILLARY: 150 mg/dL — AB (ref 65–99)
GLUCOSE-CAPILLARY: 135 mg/dL — AB (ref 65–99)
GLUCOSE-CAPILLARY: 119 mg/dL — AB (ref 65–99)
GLUCOSE-CAPILLARY: 113 mg/dL — AB (ref 65–99)
GLUCOSE-CAPILLARY: 107 mg/dL — AB (ref 65–99)
GLUCOSE-CAPILLARY: 217 mg/dL — AB (ref 65–99)
GLUCOSE-CAPILLARY: 160 mg/dL — AB (ref 65–99)
Glucose-Capillary: 205 mg/dL — ABNORMAL HIGH (ref 65–99)
Glucose-Capillary: 171 mg/dL — ABNORMAL HIGH (ref 65–99)
Glucose-Capillary: 184 mg/dL — ABNORMAL HIGH (ref 65–99)
Glucose-Capillary: 206 mg/dL — ABNORMAL HIGH (ref 65–99)
Glucose-Capillary: 245 mg/dL — ABNORMAL HIGH (ref 65–99)
Glucose-Capillary: 329 mg/dL — ABNORMAL HIGH (ref 65–99)
Glucose-Capillary: 237 mg/dL — ABNORMAL HIGH (ref 65–99)
Glucose-Capillary: 316 mg/dL — ABNORMAL HIGH (ref 65–99)
Glucose-Capillary: 182 mg/dL — ABNORMAL HIGH (ref 65–99)
Glucose-Capillary: 107 mg/dL — ABNORMAL HIGH (ref 65–99)

## 2016-12-02 LAB — CULTURE, BLOOD (ROUTINE X 2)
CULTURE: NO GROWTH
Culture: NO GROWTH

## 2016-12-02 MED ORDER — FUROSEMIDE 10 MG/ML IJ SOLN
40.0000 mg | Freq: Once | INTRAMUSCULAR | Status: AC
  Administered 2016-12-03: 40 mg via INTRAVENOUS
  Filled 2016-12-02: qty 4

## 2016-12-02 MED ORDER — INSULIN GLARGINE 100 UNIT/ML ~~LOC~~ SOLN
15.0000 [IU] | SUBCUTANEOUS | Status: DC
  Administered 2016-12-02: 15 [IU] via SUBCUTANEOUS
  Filled 2016-12-02: qty 0.15

## 2016-12-02 MED ORDER — SODIUM CHLORIDE 0.9 % IV SOLN
INTRAVENOUS | Status: DC
  Filled 2016-12-02: qty 2.5

## 2016-12-02 MED ORDER — CARVEDILOL 3.125 MG PO TABS
6.2500 mg | ORAL_TABLET | Freq: Two times a day (BID) | ORAL | Status: DC
  Administered 2016-12-02 – 2016-12-09 (×14): 6.25 mg via ORAL
  Filled 2016-12-02 (×14): qty 2

## 2016-12-02 MED ORDER — POTASSIUM CHLORIDE 20 MEQ/15ML (10%) PO SOLN
30.0000 meq | ORAL | Status: AC
  Administered 2016-12-02 (×2): 30 meq
  Filled 2016-12-02 (×2): qty 30

## 2016-12-02 MED ORDER — INSULIN ASPART 100 UNIT/ML ~~LOC~~ SOLN
2.0000 [IU] | SUBCUTANEOUS | Status: DC
  Administered 2016-12-02: 2 [IU] via SUBCUTANEOUS

## 2016-12-02 MED ORDER — INSULIN ASPART 100 UNIT/ML ~~LOC~~ SOLN
2.0000 [IU] | SUBCUTANEOUS | Status: DC
  Administered 2016-12-02: 6 [IU] via SUBCUTANEOUS

## 2016-12-02 NOTE — Progress Notes (Signed)
Subjective: No acute events overnight, Still on propofol for coughing spells and vent desynchrony    Exam: Vitals:   12/02/16 1000 12/02/16 1100  BP: 131/78 (!) 143/100  Pulse: (!) 54 (!) 131  Resp: 13 13  Temp:     Gen: In bed, NAD Resp: Still intubated,non-labored breathing, no acute distress Abd: soft, nt  Neuro: MS: awake, does not follow commands.  CN: Does appear to fixate and track but consistent Motor: clear right hemiparesis, doe snot move right side much to nox stim, L>R Sensory: grimaces to nox stim x 4.   Impression: 56 yo M with new onset seizures in the setting of SDH, now with right sided hemiparesis and aphasia. He had a post-ictal aphasia and hemiparesis initially, but this cleared after the initial event. Now with persistent right hemiparesis and aphasia.   Exam remains the same  Recommendations: 1) Continue dilantin 100mg  TID 2) continue keppra 1g BID 3) MRI does not show any signs of infract  5) EEG running, will follow up official read 6) may consider MRI C-spine or repeat brain MRI in 2/3 days if EEG report is negative

## 2016-12-02 NOTE — Procedures (Signed)
LTM-EEG Report  HISTORY: Continuous video-EEG monitoring performed for 56 year old with subdural hematoma and seizures. ACQUISITION: International 10-20 system for electrode placement; 18 channels with additional eyes linked to ipsilateral ears and EKG. Additional T1-T2 electrodes were used. Continuous video recording obtained.   EEG NUMBER:  MEDICATIONS:  Day 1: LEV, PHT, propofol  DAY #1: from 1256 12/01/16 to 0730 12/02/16   BACKGROUND: An overall medium voltage continuous recording with some spontaneous variability and reactivity.  The waking background consisted of a medium voltage 1-6 Hz activity diffusely with some sparse superimposed faster frequencies.  There was no evidence of the posterior basic rhythm. State changes were present, however no clear sleep architecture was seen. There were runs of medium voltage diffuse delta range activity through waking and sleep.  EPILEPTIFORM/PERIODIC ACTIVITY: There were rare, isolated sharp waves in the left temporal region, maximal T5. SEIZURES: none EVENTS: none EKG: no significant arrhythmia  SUMMARY: This was the moderately abnormal continuous video EEG due to absence of normal background features and diffuse slow activity.  Rare epileptiform discharges were seen in the left temporal region, however no seizures were seen.

## 2016-12-02 NOTE — Progress Notes (Signed)
vLTM EEG complete. No skin breakdown 

## 2016-12-02 NOTE — Consult Note (Signed)
   [facility] CM Inpatient Consult   12/02/2016  Jeremiah Perkins 08/23/1961 [id number]   Follow up note:  Patient remains in ICU on the ventilator.  Spoke with inpatient RNCM, of [facility] Care Management involvement and will be following for any [facility] Care Management community follow up needs, if appropriate.  Currently the patient's progress has been slow per inpatient RNCM, Raynelle Fanning.  For questions, please contact:  Charlesetta Shanks, RN BSN CCM [facility]  [phone] business mobile phone Toll free office [phone]

## 2016-12-02 NOTE — Progress Notes (Signed)
PULMONARY / CRITICAL CARE MEDICINE   Name: Jeremiah Perkins MRN: [id number] DOB: 1961-06-26    ADMISSION DATE:  12/08/2016 CONSULTATION DATE:  12/18/2016  REFERRING MD:  Dr. Allena Katz   CHIEF COMPLAINT: SDH   Brief:   56 year old male with PMH of DM, COPD, CHF, OSA (on CPAP at HS), atrial flutter (on xarelto), and polysubstance abuse (patient report used cocaine last night), denies ETOH. Present to ED on 1/3 with new  SDH. Transferred to ICU and ultimately required intubation.   SUBJECTIVE:  Remains on Propofol gtt on EEG and Vent   VITAL SIGNS: BP 112/78 (BP Location: Left Leg)   Pulse (!) 55   Temp 99 F (37.2 C) (Axillary)   Resp 16   Ht 5\' 7"  (1.702 m)   Wt 117.7 kg (259 lb 7.7 oz)   SpO2 99%   BMI 40.64 kg/m   HEMODYNAMICS:    VENTILATOR SETTINGS: Vent Mode: PRVC FiO2 (%):  [30 %] 30 % Set Rate:  [18 bmp] 18 bmp Vt Set:  [530 mL] 530 mL PEEP:  [5 cmH20] 5 cmH20 Pressure Support:  [12 cmH20] 12 cmH20 Plateau Pressure:  [18 cmH20-20 cmH20] 18 cmH20  INTAKE / OUTPUT: I/O last 3 completed shifts: In: 3235 [I.V.:870; Other:10; NG/GT:930; IV Piggyback:1425] Out: 4340 [Urine:4190; Stool:150]  PHYSICAL EXAMINATION: General: Adult male, no distress, lying in bed  Neuro: Sedated, not following commands, grimices to painful stimulation, right arm flaccid, moves left upper and lower extremities  HEENT: ETT in place   Cardiovascular: Tachy, no MRG, NI S1/S2   Lungs: Clear breath sounds, diminished to bases  Abdomen: non-tender, obese, active bs  Musculoskeletal: no deformities Skin: warm, dry, intact     LABS:  BMET  Recent Labs Lab 12/01/16 0510 12/01/16 1939 12/02/16 0341  NA 147* 148* 154*  K 3.4* 3.7 3.1*  CL 110 112* 114*  CO2 26 28 32  BUN 44* 47* 52*  CREATININE 1.16 1.18 1.14  GLUCOSE 192* 289* 123*    Electrolytes  Recent Labs Lab 11/29/16 1827 11/30/16 0600 12/01/16 0510 12/01/16 1939 12/02/16 0341  CALCIUM  --  8.6* 8.4* 8.4* 8.7*  MG  1.8 1.7 2.0  --   --   PHOS 2.8 3.7 3.4  --   --     CBC  Recent Labs Lab 11/29/16 1019 11/30/16 0600 12/01/16 0510  WBC 9.9 11.5* 10.3  HGB 12.3* 12.7* 12.9*  HCT 37.4* 40.5 42.0  PLT 183 159 217    Coag's  Recent Labs Lab 12/06/2016 0400 11/28/16 0323  APTT  --  34  INR 1.80 1.43    Sepsis Markers  Recent Labs Lab 12/05/2016 0635 12/11/2016 0812 12/06/2016 1153 12/06/2016 1436 11/29/16 1019 12/01/16 0510  LATICACIDVEN 2.14*  --  1.5 1.5  --   --   PROCALCITON  --  0.32  --   --  0.59 2.05    ABG  Recent Labs Lab 11/28/16 0145  PHART 7.420  PCO2ART 44.0  PO2ART 78.1*    Liver Enzymes  Recent Labs Lab 11/29/2016 0400 11/28/16 0323  AST 39 46*  ALT 32 31  ALKPHOS 77 80  BILITOT 1.0 1.3*  ALBUMIN 2.8* 2.7*    Cardiac Enzymes  Recent Labs Lab 11/30/2016 1153 12/04/2016 1833 11/26/2016 2354  TROPONINI 0.10* 0.15* 0.13*    Glucose  Recent Labs Lab 12/02/16 0245 12/02/16 0344 12/02/16 0458 12/02/16 0551 12/02/16 0651 12/02/16 0751  GLUCAP 135* 119* 113* 107* 171* 184*  Imaging Dg Chest Port 1 View  Result Date: 12/02/2016 CLINICAL DATA:  Respiratory failure EXAM: PORTABLE CHEST 1 VIEW COMPARISON:  12/01/2016 FINDINGS: Cardiac shadow is again enlarged. An endotracheal tube and nasogastric catheter are stable. The lungs are clear bilaterally. No significant pulmonary edema is noted. IMPRESSION: Tubes and lines as described above. Resolution of previously seen vascular congestion. Electronically Signed   By: Alcide Clever M.D.   On: 12/02/2016 07:15     STUDIES:  Echo Jan 2017 > LVEF 35-40% with regional wall motion abnormalities,akinesis of the anteroseptal and apical myocardium CT Head 1/4 > large falcine subdural hematoma measuring up to 19 mm thick unchanged, Minimal subarachnoid hemorrhage at sulci along the superior aspect of the interhemispheric fissure. No intracranial mass, intracerebral hemorrhage or evidence of acute infarction CXR 1/4  > Interstitial and alveolar pulmonary edema pattern without definite pleural effusions EEG 1/5 > Diffuse slowing of background, no seizures seen  EEG 1/6 > generalized polymorphic delta slowing, suggesting severe encephalopathy, no seizures  MRI Head 1/7 > Stable parafalcine and left tentorium subdural hematoma as well as parafalcine subarachnoid hemorrhage  CULTURES: UA 1/4 > Neg Urine 1/4 > neg Blood 1/4 >  Flu 1/4 > Neg  ANTIBIOTICS: Rocephin 1/4 >1/4  1/6 > Vancomycin 1/4 >1/7 Zosyn 1/4 > 1/7  SIGNIFICANT EVENTS: 1/4 > Presents to ED with weakness and headache  1/5 > Intubated   LINES/TUBES: ETT 1/4 >>   DISCUSSION: 56 year old male with PMH as written above, presents to ED on 1/4 with weakness of right LE and recent fall. New SDH was identified on CT head. While in ED patient had a witnessed seizure. Patient reports recent cocaine use last night 1/3. Currently patient is hypotensive, tachycardic, and lethargic, however easy to arouse. To be admitted to ICU with Neurology and Neurosurgery following.   ASSESSMENT / PLAN:  PULMONARY A: Acute on Chronic Respiratory Failure  H/O COPD, OSA (on CPAP at HS)  pulm edema P:   Vent support - on CPAP 12/5 Diuresis to negative  Trend CXR  Control HR as will lead to more diast HF  CARDIOVASCULAR A: Hypotension  CHF  H/O Afib/flutter (was on Xarelto), HTN  P: Cardiac Monitoring If hr control needed, would NOT use cardene, would use cardizem Restart home Coreg at half dose (6.25 BID)  PRN metoprolol  Diuresis as below   RENAL  Recent Labs Lab 12/01/16 0510 12/01/16 1939 12/02/16 0341  K 3.4* 3.7 3.1*    A:   Hypokalemia, mild hypernatremia P:  Lasix 60 BID  Trend BMP  Replace K  NO free water - allow na to rise in setting risk brain edema  GASTROINTESTINAL A:   At risk for aspiration  dysphagia P:   TF PPI  HEMATOLOGIC A:  On Chronic Anticoagulation (FFP administered in ED)  P:  SCD  Hold home  Xarelto  Trend CBC   INFECTIOUS A:   Elevated Lactic Acid - in setting of seizure (down-trending)  Asp? P:   Trend Fever and WBC curve  Continue Ceftriaxone   ENDOCRINE CBG (last 3)   Recent Labs  12/02/16 0551 12/02/16 0651 12/02/16 0751  GLUCAP 107* 171* 184*     A:   DM, controlled P:   SSI  TF coverage  q4H glucose checks  Lantus 15 units Daily   NEUROLOGIC A:   Interhemispheric SDH  -recent falls and weakness to right lower extremity  R side weakness Status Epilepticus not seen on eeg  Recent Cocaine Use  EEG -enceph MRI NSC 1/7 P:   PRN Ativan  Prop with WUA Continue Keppra per neurology  RASS goal: 0  FAMILY  - Updates:, no family at bedside   - Inter-disciplinary family meet or Palliative Care meeting due by: 1/11  Jovita Kussmaul, AG-ACNP [facility]  Pgr: [phone]  PCCM Pgr: [phone]  STAFF NOTE: Cindi Carbon, MD FACP have personally reviewed patient's available data, including medical history, events of note, physical examination and test results as part of my evaluation. I have discussed with resident/NP and other care providers such as pharmacist, RN and RRT. In addition, I personally evaluated patient and elicited key findings of: awake, not following commands, less coarse BS, less edema, pcxr resolving int prominence, Na noted 154, goal no hire 155, reduce lasix, k supp, kvo, bmet in pm for na and k , weaning cpap 5 ps 12, would like to reduce ps , I am concerned he may require trach end of week if neuro not progressing, add BB to control BP further, we are on low dose propofol 10 not likley to be an issue for eeg, prop reduced seizure threshold and low delirium, will disucss with neuro use precedex The patient is critically ill with multiple organ systems failure and requires high complexity decision making for assessment and support, frequent evaluation and titration of therapies, application of advanced  monitoring technologies and extensive interpretation of multiple databases.   Critical Care Time devoted to patient care services described in this note is 35 Minutes. This time reflects time of care of this signee: Rory Percy, MD FACP. This critical care time does not reflect procedure time, or teaching time or supervisory time of PA/NP/Med student/Med Resident etc but could involve care discussion time. Rest per NP/medical resident whose note is outlined above and that I agree with   Mcarthur Rossetti. [name], MD, FACP Pgr: [phone]  Pulmonary & Critical Care 12/02/2016 11:10 AM

## 2016-12-02 NOTE — Progress Notes (Signed)
Nutrition Follow-up  DOCUMENTATION CODES:   Morbid obesity  INTERVENTION:   Continue: Vital High Protein @ 25 ml/hr (600 ml/day) 60 ml Prostat QID Provides: 1400 kcal, 172 grams protein, and 501 ml H2O.  TF regimen and propofol at current rate providing 1685 total kcal/day   NUTRITION DIAGNOSIS:   Inadequate oral intake related to inability to eat as evidenced by NPO status. Ongoing.   GOAL:   Provide needs based on ASPEN/SCCM guidelines Met  MONITOR:   TF tolerance, Vent status, Labs  ASSESSMENT:   56 year old male with PMH of DM, COPD, CHF, OSA (on CPAP at HS), atrial flutter (on xarelto), and polysubstance abuse (patient report used cocaine last night), denies ETOH. Present to ED on 1/3 with new SDH.  Pt discussed during ICU rounds and with RN.   Patient is currently intubated on ventilator support  Propofol: 10.8 ml/hr provides: 285 kcal  CBG's: [phone]  Diet Order:  Diet NPO time specified  Skin:  Reviewed, no issues  Last BM:  unknown  Height:   Ht Readings from Last 1 Encounters:  12/07/2016 _0  (1.702 m)    Weight:   Wt Readings from Last 1 Encounters:  12/02/16 259 lb 7.7 oz (117.7 kg)    Ideal Body Weight:  67.2 kg  BMI:  Body mass index is 40.64 kg/m.  Estimated Nutritional Needs:   Kcal:  1833-5825  Protein:  >/= 168 grams  Fluid:  > 1.5 L/day  EDUCATION NEEDS:   No education needs identified at this time  [place], [place], [place] Pager [phone] After Hours Pager

## 2016-12-02 NOTE — Progress Notes (Signed)
LTM EEG checked. No skin breakdown noted. Paste added to frontal leads

## 2016-12-03 ENCOUNTER — Inpatient Hospital Stay (HOSPITAL_COMMUNITY): Payer: PPO

## 2016-12-03 LAB — GLUCOSE, CAPILLARY
GLUCOSE-CAPILLARY: 150 mg/dL — AB (ref 65–99)
GLUCOSE-CAPILLARY: 153 mg/dL — AB (ref 65–99)
GLUCOSE-CAPILLARY: 188 mg/dL — AB (ref 65–99)
GLUCOSE-CAPILLARY: 210 mg/dL — AB (ref 65–99)
GLUCOSE-CAPILLARY: 202 mg/dL — AB (ref 65–99)
Glucose-Capillary: 117 mg/dL — ABNORMAL HIGH (ref 65–99)
Glucose-Capillary: 135 mg/dL — ABNORMAL HIGH (ref 65–99)
Glucose-Capillary: 136 mg/dL — ABNORMAL HIGH (ref 65–99)
Glucose-Capillary: 146 mg/dL — ABNORMAL HIGH (ref 65–99)
Glucose-Capillary: 173 mg/dL — ABNORMAL HIGH (ref 65–99)
Glucose-Capillary: 174 mg/dL — ABNORMAL HIGH (ref 65–99)
Glucose-Capillary: 175 mg/dL — ABNORMAL HIGH (ref 65–99)
Glucose-Capillary: 191 mg/dL — ABNORMAL HIGH (ref 65–99)
Glucose-Capillary: 191 mg/dL — ABNORMAL HIGH (ref 65–99)

## 2016-12-03 LAB — BASIC METABOLIC PANEL
Anion gap: 11 (ref 5–15)
BUN: 74 mg/dL — AB (ref 6–20)
CHLORIDE: 118 mmol/L — AB (ref 101–111)
CO2: 28 mmol/L (ref 22–32)
CREATININE: 1.24 mg/dL (ref 0.61–1.24)
Calcium: 9 mg/dL (ref 8.9–10.3)
GFR calc Af Amer: >60 mL/min (ref >60)
GFR calc non Af Amer: >60 mL/min (ref >60)
Glucose, Bld: 143 mg/dL — ABNORMAL HIGH (ref 65–99)
Potassium: 3.6 mmol/L (ref 3.5–5.1)
SODIUM: 157 mmol/L — AB (ref 135–145)

## 2016-12-03 LAB — CBC
HCT: 42.7 % (ref 39.0–52.0)
Hemoglobin: 13 g/dL (ref 13.0–17.0)
MCH: 29.5 pg (ref 26.0–34.0)
MCHC: 30.4 g/dL (ref 30.0–36.0)
MCV: 96.8 fL (ref 78.0–100.0)
Platelets: 333 10*3/uL (ref 150–400)
RBC: 4.41 MIL/uL (ref 4.22–5.81)
RDW: 17.4 % — AB (ref 11.5–15.5)
WBC: 8.7 10*3/uL (ref 4.0–10.5)

## 2016-12-03 LAB — MAGNESIUM: Magnesium: 2.4 mg/dL (ref 1.7–2.4)

## 2016-12-03 LAB — PHENYTOIN LEVEL, TOTAL: PHENYTOIN LVL: 13.7 ug/mL (ref 10.0–20.0)

## 2016-12-03 LAB — PHOSPHORUS: Phosphorus: 3.7 mg/dL (ref 2.5–4.6)

## 2016-12-03 MED ORDER — DILTIAZEM HCL 100 MG IV SOLR
5.0000 mg/h | INTRAVENOUS | Status: DC
  Administered 2016-12-03: 15 mg/h via INTRAVENOUS
  Administered 2016-12-03: 5 mg/h via INTRAVENOUS
  Administered 2016-12-04 (×2): 15 mg/h via INTRAVENOUS
  Filled 2016-12-03 (×5): qty 100

## 2016-12-03 MED ORDER — INSULIN ASPART 100 UNIT/ML ~~LOC~~ SOLN
0.0000 [IU] | SUBCUTANEOUS | Status: DC
  Administered 2016-12-03 (×2): 5 [IU] via SUBCUTANEOUS
  Administered 2016-12-03: 3 [IU] via SUBCUTANEOUS
  Administered 2016-12-03: 5 [IU] via SUBCUTANEOUS
  Administered 2016-12-04 (×2): 3 [IU] via SUBCUTANEOUS
  Administered 2016-12-04: 2 [IU] via SUBCUTANEOUS
  Administered 2016-12-04: 3 [IU] via SUBCUTANEOUS
  Administered 2016-12-04 – 2016-12-05 (×3): 5 [IU] via SUBCUTANEOUS
  Administered 2016-12-05: 3 [IU] via SUBCUTANEOUS
  Administered 2016-12-05: 2 [IU] via SUBCUTANEOUS
  Administered 2016-12-05: 5 [IU] via SUBCUTANEOUS
  Administered 2016-12-05 – 2016-12-06 (×3): 3 [IU] via SUBCUTANEOUS
  Administered 2016-12-06: 5 [IU] via SUBCUTANEOUS
  Administered 2016-12-06: 2 [IU] via SUBCUTANEOUS
  Administered 2016-12-06: 3 [IU] via SUBCUTANEOUS
  Administered 2016-12-07: 2 [IU] via SUBCUTANEOUS
  Administered 2016-12-07 (×2): 3 [IU] via SUBCUTANEOUS
  Administered 2016-12-07: 2 [IU] via SUBCUTANEOUS
  Administered 2016-12-07: 5 [IU] via SUBCUTANEOUS
  Administered 2016-12-07: 2 [IU] via SUBCUTANEOUS
  Administered 2016-12-08: 8 [IU] via SUBCUTANEOUS
  Administered 2016-12-08: 2 [IU] via SUBCUTANEOUS
  Administered 2016-12-08 (×3): 3 [IU] via SUBCUTANEOUS
  Administered 2016-12-09: 5 [IU] via SUBCUTANEOUS
  Administered 2016-12-09: 3 [IU] via SUBCUTANEOUS
  Administered 2016-12-09: 2 [IU] via SUBCUTANEOUS
  Administered 2016-12-09 (×3): 3 [IU] via SUBCUTANEOUS
  Administered 2016-12-10: 11 [IU] via SUBCUTANEOUS
  Administered 2016-12-10 (×3): 5 [IU] via SUBCUTANEOUS

## 2016-12-03 MED ORDER — FREE WATER
200.0000 mL | Freq: Three times a day (TID) | Status: DC
  Administered 2016-12-03 – 2016-12-04 (×4): 200 mL

## 2016-12-03 MED ORDER — INSULIN ASPART 100 UNIT/ML ~~LOC~~ SOLN
5.0000 [IU] | SUBCUTANEOUS | Status: DC
  Administered 2016-12-03 – 2016-12-08 (×30): 5 [IU] via SUBCUTANEOUS

## 2016-12-03 MED ORDER — INSULIN GLARGINE 100 UNIT/ML ~~LOC~~ SOLN
25.0000 [IU] | Freq: Every day | SUBCUTANEOUS | Status: DC
  Administered 2016-12-03 – 2016-12-07 (×5): 25 [IU] via SUBCUTANEOUS
  Filled 2016-12-03 (×5): qty 0.25

## 2016-12-03 MED ORDER — LOPERAMIDE HCL 1 MG/5ML PO LIQD
4.0000 mg | ORAL | Status: DC | PRN
  Administered 2016-12-03 – 2016-12-08 (×2): 4 mg
  Filled 2016-12-03 (×2): qty 20

## 2016-12-03 MED ORDER — DILTIAZEM LOAD VIA INFUSION
20.0000 mg | Freq: Once | INTRAVENOUS | Status: AC
  Administered 2016-12-03: 20 mg via INTRAVENOUS
  Filled 2016-12-03: qty 20

## 2016-12-03 NOTE — Progress Notes (Signed)
Subjective: No acute events overnight, off propofol for 2 hours this am and no exam change    Exam: Vitals:   12/03/16 0900 12/03/16 1013  BP: (!) 143/90 (!) 131/118  Pulse: 70 (!) 107  Resp: 19   Temp:     Gen: In bed, NAD Resp: Still intubated,non-labored breathing, no acute distress Abd: soft, nt  Neuro: MS: awake, does not follow commands.  CN: Does appear to fixate and track Motor: right hemiparesis, doe snot move right side much to nox stim, L>R Sensory: grimaces to nox stim x 4.   Impression: 56 yo M with new onset seizures in the setting of SDH, now with right sided hemiparesis and aphasia. He had a post-ictal aphasia and hemiparesis initially, but this cleared after the initial event. Now with persistent right hemiparesis and aphasia.   Exam remains the same, unclear explanation. EEG has been negative, ok to stop. Dilantin level therapeutic   Recommendations: 1) Continue dilantin 100mg  TID 2) continue keppra 1g BID 3) MRI does not show any signs of infract  5)EEG negative, ok to d/c 6) dilantin level 14, therapeutic  7) Repeat MRI brain without contrast today

## 2016-12-03 NOTE — Progress Notes (Signed)
Inpatient Diabetes Program Recommendations  AACE/ADA: New Consensus Statement on Inpatient Glycemic Control (2015)  Target Ranges:  Prepandial:   less than 140 mg/dL      Peak postprandial:   less than 180 mg/dL (1-2 hours)      Critically ill patients:  140 - 180 mg/dL   Lab Results  Component Value Date   GLUCAP 210 (H) 12/03/2016   HGBA1C 8.5 (H) 10/26/2016   Results for TRAMONE, BENTZEL (MRN [id number]) as of 12/03/2016 14:10  Ref. Range 12/03/2016 06:19 12/03/2016 07:26 12/03/2016 08:23 12/03/2016 09:43 12/03/2016 11:33  Glucose-Capillary Latest Ref Range: 65 - 99 mg/dL 433 (H) 295 (H) 188 (H) 188 (H) 210 (H)   Review of Glycemic Control  Diabetes history: DM2, Obese, BUN=74, Creatinine=1.24 (CKD) Outpatient Diabetes medications: Lantus 20 units QHS Current orders for Inpatient glycemic control: Novolog 0-15 units Q4H, Novolog 5 units Q4H, Lantus 25 units daily, Tube feedings  Inpatient Diabetes Program Recommendations:    Agree with current plan of care.    Outpatient DM education and support classes ordered.  Thank you,  Kristine Linea, RN, MSN Diabetes Coordinator Inpatient Diabetes Program [phone] (Team Pager)

## 2016-12-03 NOTE — Progress Notes (Signed)
PULMONARY / CRITICAL CARE MEDICINE   Name: Jeremiah Perkins MRN: [id number] DOB: 07/30/1961    ADMISSION DATE:  12/22/2016 CONSULTATION DATE:  2016-12-22  REFERRING MD:  Dr. Allena Katz   CHIEF COMPLAINT: SDH   Brief:   56 year old male with PMH of DM, COPD, CHF, OSA (on CPAP at HS), atrial flutter (on xarelto), and polysubstance abuse (patient report used cocaine last night), denies ETOH. Present to ED on 1/3 with new  SDH. Transferred to ICU and ultimately required intubation.   SUBJECTIVE:  Weaning this am PS 12 again Neg 1.3 liters  VITAL SIGNS: BP 124/65   Pulse (!) 59   Temp 99.9 F (37.7 C) (Axillary)   Resp 13   Ht 5\' 7"  (1.702 m)   Wt 116.5 kg (256 lb 13.4 oz)   SpO2 96%   BMI 40.23 kg/m   HEMODYNAMICS:    VENTILATOR SETTINGS: Vent Mode: PSV;CPAP FiO2 (%):  [30 %] 30 % Set Rate:  [18 bmp] 18 bmp Vt Set:  [530 mL] 530 mL PEEP:  [5 cmH20] 5 cmH20 Pressure Support:  [12 cmH20-15 cmH20] 12 cmH20 Plateau Pressure:  [17 cmH20-19 cmH20] 19 cmH20  INTAKE / OUTPUT: I/O last 3 completed shifts: In: 3139.5 [I.V.:859.5; NG/GT:1450; IV Piggyback:830] Out: 5195 [Urine:4770; Stool:425]  PHYSICAL EXAMINATION: General: Adult male, no distress, lying in bed on vent Neuro: Sedated, not following commands, grimices to painful stimulation,more head movement, increased aggitation HEENT: ETT in place   Cardiovascular: s1 s2 IRT Lungs: ronchi  Abdomen: non-tender, obese, active bs  Musculoskeletal: no deformities Skin: warm, dry, intact     LABS:  BMET  Recent Labs Lab 12/02/16 0341 12/02/16 1619 12/03/16 0436  NA 154* 153* 157*  K 3.1* 3.6 3.6  CL 114* 116* 118*  CO2 32 28 28  BUN 52* 66* 74*  CREATININE 1.14 1.29* 1.24  GLUCOSE 123* 346* 143*    Electrolytes  Recent Labs Lab 11/30/16 0600 12/01/16 0510  12/02/16 0341 12/02/16 1619 12/03/16 0436  CALCIUM 8.6* 8.4*  < > 8.7* 8.7* 9.0  MG 1.7 2.0  --   --   --  2.4  PHOS 3.7 3.4  --   --   --  3.7  < >  = values in this interval not displayed.  CBC  Recent Labs Lab 11/30/16 0600 12/01/16 0510 12/03/16 0436  WBC 11.5* 10.3 8.7  HGB 12.7* 12.9* 13.0  HCT 40.5 42.0 42.7  PLT 159 217 333    Coag's  Recent Labs Lab 12/22/2016 0400 11/28/16 0323  APTT  --  34  INR 1.80 1.43    Sepsis Markers  Recent Labs Lab 22-Dec-2016 0635 12-22-2016 0812 12-22-2016 1153 12-22-2016 1436 11/29/16 1019 12/01/16 0510  LATICACIDVEN 2.14*  --  1.5 1.5  --   --   PROCALCITON  --  0.32  --   --  0.59 2.05    ABG  Recent Labs Lab 11/28/16 0145  PHART 7.420  PCO2ART 44.0  PO2ART 78.1*    Liver Enzymes  Recent Labs Lab 12-22-2016 0400 11/28/16 0323  AST 39 46*  ALT 32 31  ALKPHOS 77 80  BILITOT 1.0 1.3*  ALBUMIN 2.8* 2.7*    Cardiac Enzymes  Recent Labs Lab 12-22-2016 1153 2016/12/22 1833 2016/12/22 2354  TROPONINI 0.10* 0.15* 0.13*    Glucose  Recent Labs Lab 12/03/16 0319 12/03/16 0432 12/03/16 0518 12/03/16 0619 12/03/16 0726 12/03/16 0823  GLUCAP 146* 135* 117* 174* 175* 153*    Imaging  Dg Chest Port 1 View  Result Date: 12/03/2016 CLINICAL DATA:  Respiratory failure, intubated patient. History of CVA. EXAM: PORTABLE CHEST 1 VIEW COMPARISON:  Portable chest x-ray of December 02, 2016 FINDINGS: The lungs are well-expanded. The interstitial markings are increased bilaterally. The cardiac silhouette remains enlarged. The pulmonary vascularity is more engorged. A pleural effusion on the left is suspected obscuring the hemidiaphragm today. There may be a trace of pleural fluid on the right. The endotracheal tube tip lies 4.1 cm above the carina. IMPRESSION: New onset of pulmonary edema with small bilateral pleural effusions and cardiomegaly. The support tubes are in reasonable position. Electronically Signed   By: David  [place] M.D.   On: 12/03/2016 07:57     STUDIES:  Echo Jan 2017 > LVEF 35-40% with regional wall motion abnormalities,akinesis of the anteroseptal and apical  myocardium CT Head 1/4 > large falcine subdural hematoma measuring up to 19 mm thick unchanged, Minimal subarachnoid hemorrhage at sulci along the superior aspect of the interhemispheric fissure. No intracranial mass, intracerebral hemorrhage or evidence of acute infarction CXR 1/4 > Interstitial and alveolar pulmonary edema pattern without definite pleural effusions EEG 1/5 > Diffuse slowing of background, no seizures seen  EEG 1/6 > generalized polymorphic delta slowing, suggesting severe encephalopathy, no seizures  MRI Head 1/7 > Stable parafalcine and left tentorium subdural hematoma as well as parafalcine subarachnoid hemorrhage  CULTURES: UA 1/4 > Neg Urine 1/4 > neg Blood 1/4 >  Flu 1/4 > Neg  ANTIBIOTICS: Rocephin 1/4 >1/4  1/6 > Vancomycin 1/4 >1/7 Zosyn 1/4 > 1/7  SIGNIFICANT EVENTS: 1/4 > Presents to ED with weakness and headache  1/5 > Intubated   LINES/TUBES: ETT 1/4 >>    ASSESSMENT / PLAN:  PULMONARY A: Acute on Chronic Respiratory Failure  H/O COPD, OSA (on CPAP at HS)  pulm edema P:   pulm edema remains, ensure echo done in setting cocaine Weaning as goal again PS to 8, would accept 6-7 cc/kg with rate less 24 Even to pos balance with NA noted likely needs trach end of week  CARDIOVASCULAR A: Hypotension  CHF  H/O Afib/flutter (was on Xarelto)- now rvr HTN  P: Cardiac Monitoring If hr control needed, would NOT use cardene, would use cardizem Restart home Coreg at half dose Diuresis dc Add Cardizem drip  RENAL  Recent Labs Lab 12/02/16 0341 12/02/16 1619 12/03/16 0436  K 3.1* 3.6 3.6    A:   Hypokalemia, mild hypernatremia P:  Dc lasix Add free water to get back to goal na 154 bmet in am kvo  GASTROINTESTINAL A:   At risk for aspiration  dysphagia P:   TF PPI Has BM loose, no fevers, no abdo pain, add imodoim fo rnow  HEMATOLOGIC A:  On Chronic Anticoagulation (FFP administered in ED)  P:  SCD  Trend CBC    INFECTIOUS A:   Asp less likely  P:   Dc Ceftriaxone   ENDOCRINE CBG (last 3)   Recent Labs  12/03/16 0619 12/03/16 0726 12/03/16 0823  GLUCAP 174* 175* 153*    A:   DM, controlled P:   SSI  On insulin drip  Give lantus 25 ( failed 15, add T f coverage 5, ssi moderate)  NEUROLOGIC A:   Interhemispheric SDH  -recent falls and weakness to right lower extremity  R side weakness Status Epilepticus not seen on eeg Recent Cocaine Use  EEG -enceph MRI NSC 1/7 P:   PRN Ativan  Prop with  WUA Continue Keppra per neurology  RASS goal: 0  FAMILY  - Updates:, no family at bedside   - Inter-disciplinary family meet or Palliative Care meeting due by: 1/11  Ccm time 35 min   Mcarthur Rossetti. [name], MD, FACP Pgr: [phone] [facility] 12/03/2016 9:02 AM

## 2016-12-04 ENCOUNTER — Inpatient Hospital Stay (HOSPITAL_COMMUNITY): Payer: PPO

## 2016-12-04 LAB — BASIC METABOLIC PANEL
Anion gap: 12 (ref 5–15)
Anion gap: 8 (ref 5–15)
BUN: 78 mg/dL — ABNORMAL HIGH (ref 6–20)
BUN: 89 mg/dL — AB (ref 6–20)
CHLORIDE: 121 mmol/L — AB (ref 101–111)
CO2: 28 mmol/L (ref 22–32)
CO2: 29 mmol/L (ref 22–32)
CREATININE: 1.17 mg/dL (ref 0.61–1.24)
CREATININE: 1.39 mg/dL — AB (ref 0.61–1.24)
Calcium: 8.6 mg/dL — ABNORMAL LOW (ref 8.9–10.3)
Calcium: 8.5 mg/dL — ABNORMAL LOW (ref 8.9–10.3)
Chloride: 118 mmol/L — ABNORMAL HIGH (ref 101–111)
GFR calc Af Amer: >60 mL/min (ref >60)
GFR calc non Af Amer: >60 mL/min (ref >60)
GFR calc non Af Amer: 55 mL/min — ABNORMAL LOW (ref >60)
GLUCOSE: 190 mg/dL — AB (ref 65–99)
Glucose, Bld: 211 mg/dL — ABNORMAL HIGH (ref 65–99)
Potassium: 3.1 mmol/L — ABNORMAL LOW (ref 3.5–5.1)
Potassium: 3.5 mmol/L (ref 3.5–5.1)
Sodium: 158 mmol/L — ABNORMAL HIGH (ref 135–145)
Sodium: 158 mmol/L — ABNORMAL HIGH (ref 135–145)

## 2016-12-04 LAB — CBC WITH DIFFERENTIAL/PLATELET
BASOS PCT: 1 %
Basophils Absolute: 0 10*3/uL (ref 0.0–0.1)
EOS ABS: 0.3 10*3/uL (ref 0.0–0.7)
Eosinophils Relative: 3 %
HCT: 44.3 % (ref 39.0–52.0)
Hemoglobin: 13.3 g/dL (ref 13.0–17.0)
Lymphocytes Relative: 13 %
Lymphs Abs: 1.1 10*3/uL (ref 0.7–4.0)
MCH: 29.2 pg (ref 26.0–34.0)
MCHC: 30 g/dL (ref 30.0–36.0)
MCV: 97.1 fL (ref 78.0–100.0)
MONO ABS: 0.6 10*3/uL (ref 0.1–1.0)
MONOS PCT: 7 %
Neutro Abs: 6.5 10*3/uL (ref 1.7–7.7)
Neutrophils Relative %: 76 %
Platelets: 322 10*3/uL (ref 150–400)
RBC: 4.56 MIL/uL (ref 4.22–5.81)
RDW: 17.6 % — AB (ref 11.5–15.5)
WBC: 8.6 10*3/uL (ref 4.0–10.5)

## 2016-12-04 LAB — GLUCOSE, CAPILLARY
GLUCOSE-CAPILLARY: 136 mg/dL — AB (ref 65–99)
GLUCOSE-CAPILLARY: 132 mg/dL — AB (ref 65–99)
GLUCOSE-CAPILLARY: 194 mg/dL — AB (ref 65–99)
Glucose-Capillary: 190 mg/dL — ABNORMAL HIGH (ref 65–99)
Glucose-Capillary: 221 mg/dL — ABNORMAL HIGH (ref 65–99)
Glucose-Capillary: 239 mg/dL — ABNORMAL HIGH (ref 65–99)

## 2016-12-04 LAB — TRIGLYCERIDES: Triglycerides: 172 mg/dL — ABNORMAL HIGH (ref <150)

## 2016-12-04 MED ORDER — DILTIAZEM 12 MG/ML ORAL SUSPENSION
60.0000 mg | Freq: Three times a day (TID) | ORAL | Status: DC
  Administered 2016-12-04 – 2016-12-15 (×30): 60 mg via ORAL
  Filled 2016-12-04 (×34): qty 6

## 2016-12-04 MED ORDER — DEXTROSE 5 % IV SOLN
INTRAVENOUS | Status: AC
  Administered 2016-12-04: 12:00:00 via INTRAVENOUS

## 2016-12-04 MED ORDER — POTASSIUM CHLORIDE 20 MEQ/15ML (10%) PO SOLN
30.0000 meq | ORAL | Status: AC
  Administered 2016-12-04 (×2): 30 meq
  Filled 2016-12-04 (×2): qty 30

## 2016-12-04 MED ORDER — ASPIRIN 325 MG PO TABS
325.0000 mg | ORAL_TABLET | Freq: Every day | ORAL | Status: DC
  Administered 2016-12-04 – 2016-12-16 (×12): 325 mg via ORAL
  Filled 2016-12-04 (×12): qty 1

## 2016-12-04 MED ORDER — DEXTROSE-NACL 5-0.45 % IV SOLN
INTRAVENOUS | Status: DC
  Administered 2016-12-04: 11:00:00 via INTRAVENOUS

## 2016-12-04 MED ORDER — FREE WATER
200.0000 mL | Freq: Four times a day (QID) | Status: DC
  Administered 2016-12-04 – 2016-12-05 (×4): 200 mL

## 2016-12-04 NOTE — Progress Notes (Signed)
Subjective: No acute events overnight, more awake this am but not following commands   Exam: Vitals:   12/04/16 0900 12/04/16 1000  BP: (!) 120/93 (!) 155/111  Pulse: (!) 109 (!) 59  Resp:  (!) 28  Temp:     Gen: In bed, NAD Resp: Still intubated,non-labored breathing, no acute distress Abd: soft, nt  Neuro: MS: awake, does not follow commands.  CN: Does appear to fixate and track Motor: right hemiparesis, doe snot move right side much to nox stim, L>R Sensory: grimaces to nox stim x 4.   Impression: 56 yo M with new onset seizures in the setting of SDH, now with right sided hemiparesis and aphasia. He had a post-ictal aphasia and hemiparesis initially, but this cleared after the initial event. Now with persistent right hemiparesis and aphasia.   Exam remains the same, unclear explanation. EEG has been negative, ok to stop. Dilantin level therapeutic   Recommendations: 1) Continue dilantin 100mg  TID 2) continue keppra 1g BID 3) Repeat MRI still does not show any reason for patients symtpoms. Does show new infract most likely related to his Aflutter/afib, would recommend starting ASA 325mg  now and transitioning in 14 days from admission back to his Xorelto due to his SDH 4)EEG negative, ok to d/c 6) dilantin level therapeutic  7) Hypernatremia can be responsible for his mental status, currently on D5W, once normal can reevaluate his mental status 8) May consider MRI C-spine for his R sided hemiparesis

## 2016-12-04 NOTE — Progress Notes (Signed)
PULMONARY / CRITICAL CARE MEDICINE   Name: Jeremiah Perkins MRN: [id number] DOB: November 03, 1961    ADMISSION DATE:  2016/12/24 CONSULTATION DATE:  2016-12-24  REFERRING MD:  Dr. Allena Katz   CHIEF COMPLAINT: SDH   Brief:   56 year old male with PMH of DM, COPD, CHF, OSA (on CPAP at HS), atrial flutter (on xarelto), and polysubstance abuse (patient report used cocaine last night), denies ETOH. Present to ED on 1/3 with new  SDH. Transferred to ICU and ultimately required intubation.    SUBJECTIVE:  Weaning this am PS 10 Pos 270 ml  Remains on Cardizem and Propofol gtt   VITAL SIGNS: BP (!) 120/93   Pulse (!) 109   Temp 99.9 F (37.7 C) (Axillary)   Resp 20   Ht 5\' 7"  (1.702 m)   Wt 115.4 kg (254 lb 6.6 oz)   SpO2 98%   BMI 39.85 kg/m   HEMODYNAMICS:    VENTILATOR SETTINGS: Vent Mode: PSV;CPAP FiO2 (%):  [30 %] 30 % Set Rate:  [18 bmp] 18 bmp Vt Set:  [530 mL] 530 mL PEEP:  [5 cmH20] 5 cmH20 Pressure Support:  [10 cmH20] 10 cmH20 Plateau Pressure:  [18 cmH20-23 cmH20] 20 cmH20  INTAKE / OUTPUT: I/O last 3 completed shifts: In: 2736.7 [I.V.:1081.7; NG/GT:1175; IV Piggyback:480] Out: 2625 [Urine:2525; Stool:100]  PHYSICAL EXAMINATION: General: Adult male, no distress, lying in bed on vent Neuro: Sedated, alert, not following commands, moves left upper and lower extremities, right side flaccid  HEENT: ETT in place  Cardiovascular: NI S1/S2, no MRG, aflutter Lungs: on vent, unlabored, rhonchi throughout  Abdomen: non-tender, obese, active bs  Musculoskeletal: no deformities Skin: warm, dry, intact     LABS:  BMET  Recent Labs Lab 12/02/16 1619 12/03/16 0436 12/04/16 0531  NA 153* 157* 158*  K 3.6 3.6 3.1*  CL 116* 118* 118*  CO2 28 28 28   BUN 66* 74* 78*  CREATININE 1.29* 1.24 1.17  GLUCOSE 346* 143* 190*    Electrolytes  Recent Labs Lab 11/30/16 0600 12/01/16 0510  12/02/16 1619 12/03/16 0436 12/04/16 0531  CALCIUM 8.6* 8.4*  < > 8.7* 9.0 8.6*   MG 1.7 2.0  --   --  2.4  --   PHOS 3.7 3.4  --   --  3.7  --   < > = values in this interval not displayed.  CBC  Recent Labs Lab 12/01/16 0510 12/03/16 0436 12/04/16 0531  WBC 10.3 8.7 8.6  HGB 12.9* 13.0 13.3  HCT 42.0 42.7 44.3  PLT 217 333 322    Coag's  Recent Labs Lab 11/28/16 0323  APTT 34  INR 1.43    Sepsis Markers  Recent Labs Lab 24-Dec-2016 1153 December 24, 2016 1436 11/29/16 1019 12/01/16 0510  LATICACIDVEN 1.5 1.5  --   --   PROCALCITON  --   --  0.59 2.05    ABG  Recent Labs Lab 11/28/16 0145  PHART 7.420  PCO2ART 44.0  PO2ART 78.1*    Liver Enzymes  Recent Labs Lab 11/28/16 0323  AST 46*  ALT 31  ALKPHOS 80  BILITOT 1.3*  ALBUMIN 2.7*    Cardiac Enzymes  Recent Labs Lab Dec 24, 2016 1153 12-24-16 1833 2016/12/24 2354  TROPONINI 0.10* 0.15* 0.13*    Glucose  Recent Labs Lab 12/03/16 0943 12/03/16 1133 12/03/16 1549 12/03/16 2343 12/04/16 0327 12/04/16 0749  GLUCAP 188* 210* 202* 191* 136* 132*    Imaging Mr Brain Wo Contrast  Result Date: 12/03/2016 CLINICAL  DATA:  Seizure.  Subdural hematoma. EXAM: MRI HEAD WITHOUT CONTRAST TECHNIQUE: Multiplanar, multiecho pulse sequences of the brain and surrounding structures were obtained without intravenous contrast. COMPARISON:  Brain MRI 11/30/2016 Head CT 11/28/2016 FINDINGS: Brain: There is a punctate focus of diffusion restriction within the right occipital lobe (series 3 image 22). No other evidence of acute ischemia. Parafalcine subdural hematoma measures approximately 1.3 x 7.2 cm, unchanged when allowing for differences in technique and slice selection. Small component of subdural blood along the left occipital lobe is also unchanged There is a small amount of subarachnoid blood overlying the left convexity. Mild periventricular hyperintense T2 weighted signal. There is no evidence of herniation or midline shift. No hydrocephalus or extra-axial fluid collection. The midline  structures are normal. No age advanced or lobar predominant atrophy. Vascular: Major intracranial arterial and venous sinus flow voids are preserved. No evidence of chronic microhemorrhage or amyloid angiopathy. Skull and upper cervical spine: The visualized skull base, calvarium, upper cervical spine and extracranial soft tissues are normal. Sinuses/Orbits: Mild right maxillary sinus mucosal thickening. Small amount of bilateral mastoid fluid. Normal orbits. IMPRESSION: 1. Unchanged size and configuration of parafalcine subdural and subarachnoid blood without new mass effect. 2. Punctate focus of acute ischemia within the right occipital lobe. Electronically Signed   By: Deatra Robinson M.D.   On: 12/03/2016 22:59   Dg Chest Port 1 View  Result Date: 12/04/2016 CLINICAL DATA:  56 year old male with respiratory failure. Subdural hematoma after a fall. Initial encounter. EXAM: PORTABLE CHEST 1 VIEW COMPARISON:  12/03/2016 and earlier. FINDINGS: Portable AP semi upright view at at 0601 hours. Endotracheal tube remain stable. Enteric tube courses to the abdomen, tip not included. Interval improved ventilation and regression of the diffuse interstitial opacity seen yesterday. Still, pulmonary vascularity has not returned to the 12/02/2016 level. Confluent increased left basilar opacity also is new compared to 12/02/2016. Stable cardiac size and mediastinal contours. IMPRESSION: 1.  Stable lines and tubes. 2. Moderate regression of pulmonary edema since yesterday with mild residual. 3. Small left pleural effusion suspected and new since 12/02/2016. Electronically Signed   By: Odessa Fleming M.D.   On: 12/04/2016 07:16    STUDIES:  Echo Jan 2017 > LVEF 35-40% with regional wall motion abnormalities,akinesis of the anteroseptal and apical myocardium CT Head 1/4 > large falcine subdural hematoma measuring up to 19 mm thick unchanged, Minimal subarachnoid hemorrhage at sulci along the superior aspect of the interhemispheric  fissure. No intracranial mass, intracerebral hemorrhage or evidence of acute infarction CXR 1/4 > Interstitial and alveolar pulmonary edema pattern without definite pleural effusions EEG 1/5 > Diffuse slowing of background, no seizures seen  EEG 1/6 > generalized polymorphic delta slowing, suggesting severe encephalopathy, no seizures  MRI Head 1/7 > Stable parafalcine and left tentorium subdural hematoma as well as parafalcine subarachnoid hemorrhage Echo 1/20 > 35-40%, reduced systolic function   CULTURES: UA 1/4 > Neg Urine 1/4 > neg Blood 1/4 > Neg  Flu 1/4 > Neg  ANTIBIOTICS: Rocephin 1/4 >1/4  1/6 > Vancomycin 1/4 >1/7 Zosyn 1/4 > 1/7  SIGNIFICANT EVENTS: 1/4 > Presents to ED with weakness and headache  1/5 > Intubated   LINES/TUBES: ETT 1/4 >>    ASSESSMENT / PLAN:  PULMONARY A: Acute on Chronic Respiratory Failure  H/O COPD, OSA (on CPAP at HS)  pulm edema P:   pulm edema remains - improved from yesterday  Trend CXR  Weaning as goal again PS to 10, would accept 6-7  cc/kg with rate less 24  Speak with family about Janina Mayo - may need next week if not able to wean  Even to pos balance with NA noted  CARDIOVASCULAR A: Hypotension - resolved  CHF  H/O Afib/flutter (was on Xarelto)- now rvr HTN  P: Cardiac Monitoring Continue Coreg at half dose Continue Cardizem gtt   RENAL  Recent Labs Lab 12/02/16 1619 12/03/16 0436 12/04/16 0531  K 3.6 3.6 3.1*    A:   Hypokalemia, mild hypernatremia P:  Continue free water to get back to goal na 154 Trend BMP  kvo  GASTROINTESTINAL A:   At risk for aspiration  dysphagia P:   TF PPI Has BM loose, no fevers, no abdo pain, add imodoim fo rnow  HEMATOLOGIC A:  On Chronic Anticoagulation (FFP administered in ED)  P:  SCD  Trend CBC   INFECTIOUS A:   Asp less likely  P:   Trend WBC and Fever Curve   ENDOCRINE CBG (last 3)   Recent Labs  12/03/16 2343 12/04/16 0327 12/04/16 0749  GLUCAP  191* 136* 132*    A:   DM, controlled P:   SSI  TF coverage  Lantus 25 u daily   NEUROLOGIC A:   Interhemispheric SDH  -recent falls and weakness to right lower extremity  R side weakness Status Epilepticus not seen on eeg Recent Cocaine Use  EEG -enceph MRI NSC 1/7 P:   PRN Ativan  Continue Prop  Continue Keppra and Dilantin per neurology  RASS goal: 0  FAMILY  - Updates:, no family at bedside   - Inter-disciplinary family meet or Palliative Care meeting due by: 1/11  Jovita Kussmaul, AG-ACNP [facility]  Pgr: [phone]  PCCM Pgr: [phone]  STAFF NOTE: Cindi Carbon, MD FACP have personally reviewed patient's available data, including medical history, events of note, physical examination and test results as part of my evaluation. I have discussed with resident/NP and other care providers such as pharmacist, RN and RRT. In addition, I personally evaluated patient and elicited key findings of: not responsive, less coarse BS, MRI done and reviewed, remains controlled fib, likely contrubted to diastolic heart failure, weaning now PS 10, cpap 5, goal 2 hours, need to correct Na slight to goal 154 , na is affecting his neurostatus, want to avoid 5 when able, start d5 now x 500 cc at 30 cc/hr, follow na in afternoon, pcxr int process remains, once an corrcted will need lasix in future, may require tarch, would prefer to correct Na prior The patient is critically ill with multiple organ systems failure and requires high complexity decision making for assessment and support, frequent evaluation and titration of therapies, application of advanced monitoring technologies and extensive interpretation of multiple databases.   Critical Care Time devoted to patient care services described in this note is 35 Minutes. This time reflects time of care of this signee: Rory Percy, MD FACP. This critical care time does not reflect procedure time, or  teaching time or supervisory time of PA/NP/Med student/Med Resident etc but could involve care discussion time. Rest per NP/medical resident whose note is outlined above and that I agree with   Mcarthur Rossetti. [name], MD, FACP Pgr: [phone] [facility] 12/04/2016 10:38 AM  '

## 2016-12-04 NOTE — Progress Notes (Signed)
RT note-Elevated RR, wean stopped at this time.

## 2016-12-05 ENCOUNTER — Inpatient Hospital Stay (HOSPITAL_COMMUNITY): Payer: PPO

## 2016-12-05 LAB — BASIC METABOLIC PANEL
Anion gap: 11 (ref 5–15)
Anion gap: 11 (ref 5–15)
BUN: 85 mg/dL — AB (ref 6–20)
BUN: 86 mg/dL — AB (ref 6–20)
CHLORIDE: 121 mmol/L — AB (ref 101–111)
CHLORIDE: 120 mmol/L — AB (ref 101–111)
CO2: 28 mmol/L (ref 22–32)
CO2: 25 mmol/L (ref 22–32)
CREATININE: 1.28 mg/dL — AB (ref 0.61–1.24)
CREATININE: 1.51 mg/dL — AB (ref 0.61–1.24)
Calcium: 8.6 mg/dL — ABNORMAL LOW (ref 8.9–10.3)
Calcium: 8.3 mg/dL — ABNORMAL LOW (ref 8.9–10.3)
GFR calc Af Amer: >60 mL/min (ref >60)
GFR calc Af Amer: 57 mL/min — ABNORMAL LOW (ref >60)
GFR calc non Af Amer: >60 mL/min (ref >60)
GFR calc non Af Amer: 50 mL/min — ABNORMAL LOW (ref >60)
GLUCOSE: 178 mg/dL — AB (ref 65–99)
GLUCOSE: 237 mg/dL — AB (ref 65–99)
Potassium: 3.3 mmol/L — ABNORMAL LOW (ref 3.5–5.1)
Potassium: 4 mmol/L (ref 3.5–5.1)
Sodium: 160 mmol/L — ABNORMAL HIGH (ref 135–145)
Sodium: 156 mmol/L — ABNORMAL HIGH (ref 135–145)

## 2016-12-05 LAB — PHOSPHORUS: Phosphorus: 3.1 mg/dL (ref 2.5–4.6)

## 2016-12-05 LAB — GLUCOSE, CAPILLARY
Glucose-Capillary: 122 mg/dL — ABNORMAL HIGH (ref 65–99)
Glucose-Capillary: 159 mg/dL — ABNORMAL HIGH (ref 65–99)
Glucose-Capillary: 168 mg/dL — ABNORMAL HIGH (ref 65–99)
Glucose-Capillary: 202 mg/dL — ABNORMAL HIGH (ref 65–99)
Glucose-Capillary: 208 mg/dL — ABNORMAL HIGH (ref 65–99)
Glucose-Capillary: 243 mg/dL — ABNORMAL HIGH (ref 65–99)

## 2016-12-05 LAB — CBC
HCT: 45 % (ref 39.0–52.0)
Hemoglobin: 13.5 g/dL (ref 13.0–17.0)
MCH: 29.3 pg (ref 26.0–34.0)
MCHC: 30 g/dL (ref 30.0–36.0)
MCV: 97.8 fL (ref 78.0–100.0)
PLATELETS: 352 10*3/uL (ref 150–400)
RBC: 4.6 MIL/uL (ref 4.22–5.81)
RDW: 17.7 % — AB (ref 11.5–15.5)
WBC: 6.5 10*3/uL (ref 4.0–10.5)

## 2016-12-05 LAB — MAGNESIUM: Magnesium: 2.7 mg/dL — ABNORMAL HIGH (ref 1.7–2.4)

## 2016-12-05 MED ORDER — DEXTROSE 5 % IV SOLN
INTRAVENOUS | Status: DC
  Administered 2016-12-05: 15:00:00 via INTRAVENOUS
  Administered 2016-12-06: 50 mL via INTRAVENOUS

## 2016-12-05 MED ORDER — POTASSIUM CHLORIDE 20 MEQ/15ML (10%) PO SOLN
20.0000 meq | ORAL | Status: AC
  Administered 2016-12-05 (×2): 20 meq
  Filled 2016-12-05 (×2): qty 15

## 2016-12-05 MED ORDER — FREE WATER
200.0000 mL | Status: DC
  Administered 2016-12-05 – 2016-12-07 (×13): 200 mL

## 2016-12-05 NOTE — Progress Notes (Signed)
PULMONARY / CRITICAL CARE MEDICINE   Name: Jeremiah Perkins MRN: [id number] DOB: 01-01-1961    ADMISSION DATE:  12/15/2016 CONSULTATION DATE:  11/29/2016  REFERRING MD:  Dr. Allena Katz   CHIEF COMPLAINT: SDH   Brief:   56 year old male with PMH of DM, COPD, CHF, OSA (on CPAP at HS), atrial flutter (on xarelto), and polysubstance abuse (patient report used cocaine last night), denies ETOH. Present to ED on 1/3 with new SDH. Transferred to ICU and ultimately required intubation.    SUBJECTIVE:  Failed wean this AM secondary tachypnea Off Cardizem gtt, remains on propofol gtt, on vent   VITAL SIGNS: BP 106/85   Pulse (!) 105   Temp 98.9 F (37.2 C) (Axillary)   Resp 18   Ht 5\' 7"  (1.702 m)   Wt 115.7 kg (255 lb 1.2 oz)   SpO2 96%   BMI 39.95 kg/m   HEMODYNAMICS:    VENTILATOR SETTINGS: Vent Mode: PRVC FiO2 (%):  [30 %] 30 % Set Rate:  [18 bmp] 18 bmp Vt Set:  [530 mL] 530 mL PEEP:  [5 cmH20] 5 cmH20 Pressure Support:  [10 cmH20] 10 cmH20 Plateau Pressure:  [17 cmH20-19 cmH20] 19 cmH20  INTAKE / OUTPUT: I/O last 3 completed shifts: In: 3140.9 [I.V.:1460.9; NG/GT:1250; IV Piggyback:430] Out: 3285 [Urine:3035; Stool:250]  PHYSICAL EXAMINATION: General: Adult male, lying in bed, on vent  Neuro: Sedated, alert, not following commands, moves left upper and lower extremities, right side flaccid  HEENT: ETT in place  Cardiovascular: NI S1/S2, no MRG, aflutter - regular rate  Lungs: on vent, unlabored, clear breath sounds, diminished at bases  Abdomen: non-tender, obese, active bs  Musculoskeletal: no deformities Skin: warm, dry, intact     LABS:  BMET  Recent Labs Lab 12/04/16 0531 12/04/16 2051 12/05/16 0356  NA 158* 158* 160*  K 3.1* 3.5 3.3*  CL 118* 121* 121*  CO2 28 29 28   BUN 78* 89* 85*  CREATININE 1.17 1.39* 1.28*  GLUCOSE 190* 211* 178*    Electrolytes  Recent Labs Lab 12/01/16 0510  12/03/16 0436 12/04/16 0531 12/04/16 2051 12/05/16 0356   CALCIUM 8.4*  < > 9.0 8.6* 8.5* 8.6*  MG 2.0  --  2.4  --   --  2.7*  PHOS 3.4  --  3.7  --   --  3.1  < > = values in this interval not displayed.  CBC  Recent Labs Lab 12/03/16 0436 12/04/16 0531 12/05/16 0356  WBC 8.7 8.6 6.5  HGB 13.0 13.3 13.5  HCT 42.7 44.3 45.0  PLT 333 322 352    Coag's No results for input(s): APTT, INR in the last 168 hours.  Sepsis Markers  Recent Labs Lab 11/29/16 1019 12/01/16 0510  PROCALCITON 0.59 2.05    ABG No results for input(s): PHART, PCO2ART, PO2ART in the last 168 hours.  Liver Enzymes No results for input(s): AST, ALT, ALKPHOS, BILITOT, ALBUMIN in the last 168 hours.  Cardiac Enzymes No results for input(s): TROPONINI, PROBNP in the last 168 hours.  Glucose  Recent Labs Lab 12/04/16 1155 12/04/16 1606 12/04/16 1932 12/04/16 2346 12/05/16 0325 12/05/16 0729  GLUCAP 190* 194* 221* 239* 168* 122*    Imaging Dg Chest Port 1 View  Result Date: 12/05/2016 CLINICAL DATA:  Follow-up pleural effusion. EXAM: PORTABLE CHEST 1 VIEW COMPARISON:  12/04/2016 FINDINGS: Endotracheal tube tip is 4 cm above the carina. Nasogastric tube enters the abdomen. There is diminished diffuse pulmonary density and better aeration  in the left lower lobe. No worsening or new finding. IMPRESSION: Radiographic improvement with diminished diffuse density and better aeration in the left lower lobe. Electronically Signed   By: Paulina Fusi M.D.   On: 12/05/2016 07:24    STUDIES:  Echo Jan 2017 > LVEF 35-40% with regional wall motion abnormalities,akinesis of the anteroseptal and apical myocardium CT Head 1/4 > large falcine subdural hematoma measuring up to 19 mm thick unchanged, Minimal subarachnoid hemorrhage at sulci along the superior aspect of the interhemispheric fissure. No intracranial mass, intracerebral hemorrhage or evidence of acute infarction CXR 1/4 > Interstitial and alveolar pulmonary edema pattern without definite pleural  effusions EEG 1/5 > Diffuse slowing of background, no seizures seen  EEG 1/6 > generalized polymorphic delta slowing, suggesting severe encephalopathy, no seizures  MRI Head 1/7 > Stable parafalcine and left tentorium subdural hematoma as well as parafalcine subarachnoid hemorrhage Echo 1/20 > 35-40%, reduced systolic function   CULTURES: UA 1/4 > Neg Urine 1/4 > neg Blood 1/4 > Neg  Flu 1/4 > Neg  ANTIBIOTICS: Rocephin 1/4 >1/4  1/6 > Vancomycin 1/4 >1/7 Zosyn 1/4 > 1/7  SIGNIFICANT EVENTS: 1/4 > Presents to ED with weakness and headache  1/5 > Intubated   LINES/TUBES: ETT 1/4 >>    ASSESSMENT / PLAN:  PULMONARY A: Acute on Chronic Respiratory Failure  H/O COPD, OSA (on CPAP at HS)  pulm edema P:   Trend CXR  Vent Support - Wean as tolerated  Weaning as goal again PS to 10, would accept 6-7 cc/kg with rate less 24  Speak with family about Janina Mayo - may need next week if not able to wean  Continue Pulmicort and Xopenex   CARDIOVASCULAR A: Hypotension - resolved  CHF  H/O Afib/flutter (was on Xarelto)- now rvr HTN  P: Cardiac Monitoring Continue Coreg at half dose Continue Cardizem po  Restart home xarelto on 1/19 D/C Cardizem gtt   RENAL  Recent Labs Lab 12/04/16 0531 12/04/16 2051 12/05/16 0356  K 3.1* 3.5 3.3*    A:   Hypokalemia Hypernatremia  P:  Continue free water to get back to goal na 154 Trend BMP, Repeat BMP at 1700  Replace electrolytes as needed  Increase D5 to 50 ml/hr Increase free water to 200 q4h  GASTROINTESTINAL A:   At risk for aspiration  dysphagia P:   TF PPI  HEMATOLOGIC A:  On Chronic Anticoagulation (FFP administered in ED)  P:  SCD  Trend CBC   INFECTIOUS A:   Asp less likely  P:   Trend WBC and Fever Curve   ENDOCRINE CBG (last 3)   Recent Labs  12/04/16 2346 12/05/16 0325 12/05/16 0729  GLUCAP 239* 168* 122*    A:   DM, controlled P:   SSI  TF coverage  Lantus 25 u daily    NEUROLOGIC A:   Interhemispheric SDH  -recent falls and weakness to right lower extremity  R side weakness Status Epilepticus not seen on eeg Recent Cocaine Use  EEG -enceph MRI NSC 1/7 P:   PRN Ativan  Continue Prop  Continue Keppra and Dilantin per neurology  RASS goal: 0  FAMILY  - Updates:, no family at bedside   - Inter-disciplinary family meet or Palliative Care meeting due by: 1/11  Jovita Kussmaul, AG-ACNP [facility]  Pgr: [phone]  PCCM Pgr: [phone]  Attending Note:  I have examined patient, reviewed labs, studies and notes. I have discussed the case with  Idolina Primer, and I agree with the data and plans as amended above. 56 yo obese man, hx COPD, CHF, DM, OSA on CPAP, admitted with hypertensive emergency in setting cocaine, c/b SDH. Intubated for airway protection. On eval he is sedated, wakes some to voice but will not follow commands. he has a rapid shallow breathing pattern on SBt (still on some propofol), does not move on the R side.  diltiazem weaned to off. lungs are coarse. We will maintain currnet MV. He does not have the MS for extubation. In part this may be due to his hypernatremia - will attempt to correct. He may ultimately require trach to allow vent liberation.  Independent critical care time is 34 minutes.   Levy Pupa, MD, PhD 12/05/2016, 3:14 PM  Pulmonary and Critical Care [phone] or if no answer [phone]

## 2016-12-05 NOTE — Progress Notes (Signed)
Subjective: In brief summary patient presented after a fall while on Xeralto and was found to have parafalcine SDH as well as some SAH. He has had intermittent right hemiplegia and aphasia. MRI x 2 negative for acute stroke.  No acute events overnight.  Exam: Vitals:   12/05/16 1900 12/05/16 2000  BP: (!) 106/59 91/71  Pulse: 94 96  Resp: 19 (!) 27  Temp:     Gen: In bed, NAD. Intubated and sedated with Propofol of 10 which was held prior to exam. Resp: Intubated. CTA bilatearlly Abd: soft, nt  Neuro: MS: Does not interact and open eyes very minimally. Does not follow any commands. Cn: Does blink to threat. At times noted to fixate. Pupils are 35mm and reactive to light bilaterally Motor: Right hemipareis and left sided somewhat withdrawal to noxious stimulation  Pertinent Labs: Na 160  Impression: 56 years old male with SDH and seizure in the setting of xaralto. Unclear etiology of right arm weakness and aphasia. ?prolong post ictal state. ?speech apraxia however difficult to examine due to intubation vs hypernatremia causing encephalopathy  Recommendations: -Cont with dilantin 100mg  TID and Keppra 1gm BID. -Agree with aspirin 81mg  daily until 14 days from the admission prior to initiation anticoagulation. I would recommend a repeat CT head 24 hours after the anticoagulation is initiated. -Slowly correct hypernatremia.

## 2016-12-05 NOTE — Progress Notes (Signed)
Inpatient Diabetes Program Recommendations  AACE/ADA: New Consensus Statement on Inpatient Glycemic Control (2015)  Target Ranges:  Prepandial:   less than 140 mg/dL      Peak postprandial:   less than 180 mg/dL (1-2 hours)      Critically ill patients:  140 - 180 mg/dL   Results for Jeremiah Perkins, Jeremiah Perkins (MRN [id number]) as of 12/05/2016 09:48  Ref. Range 12/04/2016 07:49 12/04/2016 11:55 12/04/2016 16:06 12/04/2016 19:32 12/04/2016 23:46 12/05/2016 03:25 12/05/2016 07:29  Glucose-Capillary Latest Ref Range: 65 - 99 mg/dL 782 (H) 956 (H) 213 (H) 221 (H) 239 (H) 168 (H) 122 (H)   Review of Glycemic Control  Current orders for Inpatient glycemic control: Lantus 25 units daily, Novolog 0-15 units Q4H, Novolog 5 units Q4H  Inpatient Diabetes Program Recommendations: Insulin - Basal: Please consider increasing Lantus to 28 units daily.  Thanks, Orlando Penner, RN, MSN, CDE Diabetes Coordinator Inpatient Diabetes Program [phone] (Team Pager from 8am to 5pm)

## 2016-12-05 NOTE — Progress Notes (Signed)
Patient ID: Jeremiah Perkins, male   DOB: 01/05/1961, 56 y.o.   MRN: [id number] Pt seen and examined, opens eye to sternal rub, not FC, R hemiparesis. Etiology? I could see leg weakness with the SDH but not arm. Prolonged Todd's paralysis? Continue current mgmt. Still do not believe surgery was or is in his best interest.

## 2016-12-06 ENCOUNTER — Inpatient Hospital Stay (HOSPITAL_COMMUNITY): Payer: PPO

## 2016-12-06 DIAGNOSIS — R509 Fever, unspecified: Secondary | ICD-10-CM

## 2016-12-06 DIAGNOSIS — I4892 Unspecified atrial flutter: Secondary | ICD-10-CM

## 2016-12-06 DIAGNOSIS — I609 Nontraumatic subarachnoid hemorrhage, unspecified: Secondary | ICD-10-CM

## 2016-12-06 DIAGNOSIS — I5022 Chronic systolic (congestive) heart failure: Secondary | ICD-10-CM

## 2016-12-06 LAB — BASIC METABOLIC PANEL
Anion gap: 9 (ref 5–15)
BUN: 87 mg/dL — AB (ref 6–20)
CALCIUM: 8.1 mg/dL — AB (ref 8.9–10.3)
CHLORIDE: 119 mmol/L — AB (ref 101–111)
CO2: 26 mmol/L (ref 22–32)
CREATININE: 1.41 mg/dL — AB (ref 0.61–1.24)
GFR calc Af Amer: >60 mL/min (ref >60)
GFR calc non Af Amer: 54 mL/min — ABNORMAL LOW (ref >60)
Glucose, Bld: 206 mg/dL — ABNORMAL HIGH (ref 65–99)
Potassium: 3.3 mmol/L — ABNORMAL LOW (ref 3.5–5.1)
SODIUM: 154 mmol/L — AB (ref 135–145)

## 2016-12-06 LAB — PHOSPHORUS: Phosphorus: 3.5 mg/dL (ref 2.5–4.6)

## 2016-12-06 LAB — GLUCOSE, CAPILLARY
GLUCOSE-CAPILLARY: 135 mg/dL — AB (ref 65–99)
GLUCOSE-CAPILLARY: 155 mg/dL — AB (ref 65–99)
GLUCOSE-CAPILLARY: 220 mg/dL — AB (ref 65–99)
Glucose-Capillary: 187 mg/dL — ABNORMAL HIGH (ref 65–99)
Glucose-Capillary: 169 mg/dL — ABNORMAL HIGH (ref 65–99)
Glucose-Capillary: 141 mg/dL — ABNORMAL HIGH (ref 65–99)

## 2016-12-06 LAB — CBC
HCT: 42.5 % (ref 39.0–52.0)
Hemoglobin: 13 g/dL (ref 13.0–17.0)
MCH: 29.5 pg (ref 26.0–34.0)
MCHC: 30.6 g/dL (ref 30.0–36.0)
MCV: 96.4 fL (ref 78.0–100.0)
PLATELETS: 359 10*3/uL (ref 150–400)
RBC: 4.41 MIL/uL (ref 4.22–5.81)
RDW: 17.7 % — ABNORMAL HIGH (ref 11.5–15.5)
WBC: 6.7 10*3/uL (ref 4.0–10.5)

## 2016-12-06 LAB — MAGNESIUM: MAGNESIUM: 2.6 mg/dL — AB (ref 1.7–2.4)

## 2016-12-06 MED ORDER — SODIUM CHLORIDE 0.9% FLUSH: 10.0000 mL | INTRAVENOUS | Status: DC | PRN

## 2016-12-06 MED ORDER — SODIUM CHLORIDE 0.9% FLUSH
10.0000 mL | Freq: Two times a day (BID) | INTRAVENOUS | Status: DC
  Administered 2016-12-06: 30 mL
  Administered 2016-12-07 – 2016-12-10 (×7): 10 mL
  Administered 2016-12-10: 20 mL
  Administered 2016-12-11 – 2016-12-16 (×7): 10 mL

## 2016-12-06 MED ORDER — POTASSIUM CHLORIDE 20 MEQ/15ML (10%) PO SOLN
40.0000 meq | Freq: Once | ORAL | Status: AC
Start: 2016-12-06 — End: 2016-12-06
  Administered 2016-12-06: 40 meq
  Filled 2016-12-06: qty 30

## 2016-12-06 NOTE — Progress Notes (Signed)
Attending:  I have seen and examined the patient with nurse practitioner/resident and agree with the note above.  We formulated the plan together and I elicited the following history.    Admitted on 1/4 with COPD, CHF, OSA, fell had subdural hemorrhage, seizure  On exam Lungs with some rhonchi, vent supported breathing  Belly soft, nontender Derm: trace ankle edema Irreg irreg  CXR reviewed> mild interstitial edema, ETT 7cm above carina  MRI 1/10> punctate areas of acute ischemia in R occipital lobe; unchanged size of large SDH and SAH  Afib: holding anticoagulation, continue dilt and coreg CHF: holding diuresis, continue coreg Fever: monitoring off of antibiotics, is this drug fever? No infiltrate on CXR, no WBC elevation, continue to observe off antibiotics; continue APAP Seizure: continue dilantin, keppra per neurology Hypernatermia: continue free water, will decrease D5 to 25 cc hour Respiratory failure: advance ETT 3cm, VAP prevention, suspect he will need tracheostomy  My cc time 32 minutes  [place], MD [facility] Pager: [phone] Cell: [phone] After 3pm or if no response, call [phone]

## 2016-12-06 NOTE — Progress Notes (Signed)
eLink Physician-Brief Progress Note Patient Name: SHIGETO SCHRAM DOB: 1961/03/31 MRN: [id number]   Date of Service  12/06/2016  HPI/Events of Note  Potassium 3.3. Enteric feeding tube in place.   eICU Interventions  KCl 40 mEq via tube      Intervention Category Intermediate Interventions: Electrolyte abnormality - evaluation and management  Lawanda Cousins 12/06/2016, 6:31 AM

## 2016-12-06 NOTE — Progress Notes (Signed)
No significant change in status. Right-sided weakness unchanged.  Patient with interhemispheric subdural hematoma which probably explains his current neurologic state. No new recommendations from our standpoint.

## 2016-12-06 NOTE — Progress Notes (Addendum)
Peripherally Inserted Central Catheter/Midline Placement  The IV Nurse has discussed with the patient and/or persons authorized to consent for the patient, the purpose of this procedure and the potential benefits and risks involved with this procedure.  The benefits include less needle sticks, lab draws from the catheter, and the patient may be discharged home with the catheter. Risks include, but not limited to, infection, bleeding, blood clot (thrombus formation), and puncture of an artery; nerve damage and irregular heartbeat and possibility to perform a PICC exchange if needed/ordered by physician.  Alternatives to this procedure were also discussed.  Bard Power PICC patient education guide, fact sheet on infection prevention and patient information card has been provided to patient /or left at bedside.  Consent obtained via phone from Forde Dandy, bedside RN.  PICC/Midline Placement Documentation  PICC Double Lumen 12/06/16 PICC Left Basilic 50 cm 0 cm (Active)  Indication for Insertion or Continuance of Line Vasoactive infusions;Prolonged intravenous therapies 12/06/2016  6:31 PM  Exposed Catheter (cm) 0 cm 12/06/2016  6:31 PM  Site Assessment Clean;Dry;Intact 12/06/2016  6:31 PM  Lumen #1 Status Flushed;Saline locked;Blood return noted 12/06/2016  6:31 PM  Lumen #2 Status Flushed;Saline locked;Blood return noted 12/06/2016  6:31 PM  Dressing Type Transparent 12/06/2016  6:31 PM  Dressing Status Clean;Dry;Intact;Antimicrobial disc in place 12/06/2016  6:31 PM  Line Care Connections checked and tightened 12/06/2016  6:31 PM  Line Adjustment (NICU/IV Team Only) No 12/06/2016  6:31 PM  Dressing Intervention New dressing 12/06/2016  6:31 PM  Dressing Change Due 12/13/16 12/06/2016  6:31 PM       Elliot Dally 12/06/2016, 6:32 PM

## 2016-12-06 NOTE — Progress Notes (Signed)
[facility] ADULT ICU REPLACEMENT PROTOCOL FOR AM LAB REPLACEMENT ONLY  The patient does not apply for the [facility] Adult ICU Electrolyte Replacment Protocol based on the criteria listed below:    Is BUN < 60 mg/dL? No.  Patient's BUN today is 87   Abnormal electrolyte(s):K3.3   If a panic level lab has been reported, has the CCM MD in charge been notified? Yes.  .   Physician:  Staci Acosta, MD  Melrose Nakayama 12/06/2016 6:28 AM

## 2016-12-06 NOTE — Progress Notes (Cosign Needed)
PULMONARY / CRITICAL CARE MEDICINE   Name: Jeremiah Perkins MRN: [id number] DOB: November 17, 1961    ADMISSION DATE:  12-04-2016 CONSULTATION DATE:  04-Dec-2016  REFERRING MD:  Dr. Allena Katz   CHIEF COMPLAINT: SDH   Brief:   56 year old male with PMH of DM, COPD, CHF, OSA (on CPAP at HS), atrial flutter (on xarelto), and polysubstance abuse (patient report used cocaine last night), denies ETOH. Present to ED on 1/3 with new SDH. Transferred to ICU and ultimately required intubation.    SUBJECTIVE:  Stable overnight .  Off sedation this am, does not follow commands Per RN , opens eyes and moved left hand   VITAL SIGNS: BP 109/90   Pulse (!) 102   Temp 98.7 F (37.1 C) (Axillary)   Resp (!) 26   Ht 5\' 7"  (1.702 m)   Wt 117.9 kg (259 lb 14.8 oz)   SpO2 98%   BMI 40.71 kg/m   HEMODYNAMICS:    VENTILATOR SETTINGS: Vent Mode: PRVC FiO2 (%):  [30 %] 30 % Set Rate:  [18 bmp] 18 bmp Vt Set:  [530 mL] 530 mL PEEP:  [5 cmH20] 5 cmH20 Plateau Pressure:  [17 cmH20-21 cmH20] 19 cmH20  INTAKE / OUTPUT: I/O last 3 completed shifts: In: 4511.3 [I.V.:1751.3; NG/GT:2280; IV Piggyback:480] Out: 3490 [Urine:3240; Stool:250]  PHYSICAL EXAMINATION: General: Adult male, lying in bed, on vent  Neuro: Not following commands,  right side flaccid  HEENT: ETT in place  Cardiovascular: NI S1/S2, no MRG, aflutter - regular rate  Lungs: on vent, unlabored, clear breath sounds, diminished at bases  Abdomen: non-tender, obese, active bs  Musculoskeletal: no deformities Skin: warm, dry, intact     LABS:  BMET  Recent Labs Lab 12/05/16 0356 12/05/16 1626 12/06/16 0250  NA 160* 156* 154*  K 3.3* 4.0 3.3*  CL 121* 120* 119*  CO2 28 25 26   BUN 85* 86* 87*  CREATININE 1.28* 1.51* 1.41*  GLUCOSE 178* 237* 206*    Electrolytes  Recent Labs Lab 12/03/16 0436  12/05/16 0356 12/05/16 1626 12/06/16 0250  CALCIUM 9.0  < > 8.6* 8.3* 8.1*  MG 2.4  --  2.7*  --  2.6*  PHOS 3.7  --  3.1  --   3.5  < > = values in this interval not displayed.  CBC  Recent Labs Lab 12/04/16 0531 12/05/16 0356 12/06/16 0250  WBC 8.6 6.5 6.7  HGB 13.3 13.5 13.0  HCT 44.3 45.0 42.5  PLT 322 352 359    Coag's No results for input(s): APTT, INR in the last 168 hours.  Sepsis Markers  Recent Labs Lab 11/29/16 1019 12/01/16 0510  PROCALCITON 0.59 2.05    ABG No results for input(s): PHART, PCO2ART, PO2ART in the last 168 hours.  Liver Enzymes No results for input(s): AST, ALT, ALKPHOS, BILITOT, ALBUMIN in the last 168 hours.  Cardiac Enzymes No results for input(s): TROPONINI, PROBNP in the last 168 hours.  Glucose  Recent Labs Lab 12/05/16 1200 12/05/16 1526 12/05/16 1947 12/05/16 2351 12/06/16 0351 12/06/16 0754  GLUCAP 159* 243* 202* 208* 187* 169*    Imaging Dg Chest Port 1 View  Result Date: 12/06/2016 CLINICAL DATA:  Ventilator dependence. EXAM: PORTABLE CHEST 1 VIEW COMPARISON:  November 24, 2016 FINDINGS: Stable support apparatus. Mild edema persists. The cardiomediastinal silhouette is unchanged. IMPRESSION: Stable support apparatus and mild edema. Electronically Signed   By: Gerome Sam III M.D   On: 12/06/2016 07:11    STUDIES:  Echo Jan 2017 > LVEF 35-40% with regional wall motion abnormalities,akinesis of the anteroseptal and apical myocardium CT Head 1/4 > large falcine subdural hematoma measuring up to 19 mm thick unchanged, Minimal subarachnoid hemorrhage at sulci along the superior aspect of the interhemispheric fissure. No intracranial mass, intracerebral hemorrhage or evidence of acute infarction CXR 1/4 > Interstitial and alveolar pulmonary edema pattern without definite pleural effusions EEG 1/5 > Diffuse slowing of background, no seizures seen  EEG 1/6 > generalized polymorphic delta slowing, suggesting severe encephalopathy, no seizures  MRI Head 1/7 > Stable parafalcine and left tentorium subdural hematoma as well as parafalcine subarachnoid  hemorrhage Echo 1/20 > 35-40%, reduced systolic function   CULTURES: UA 1/4 > Neg Urine 1/4 > neg Blood 1/4 > Neg  Flu 1/4 > Neg  ANTIBIOTICS: Rocephin 1/4 >1/4  1/6 > Vancomycin 1/4 >1/7 Zosyn 1/4 > 1/7  SIGNIFICANT EVENTS: 1/4 > Presents to ED with weakness and headache  1/5 > Intubated   LINES/TUBES: ETT 1/4 >>    ASSESSMENT / PLAN:  PULMONARY A: Acute on Chronic Respiratory Failure  H/O COPD, OSA (on CPAP at HS)  pulm edema P:   Trend CXR  Vent Support - Wean as tolerated  Weaning as able  Speak with family about Janina Mayo - may need next week if not able to wean  Continue Pulmicort and Xopenex   CARDIOVASCULAR A: Hypotension - resolved  CHF  H/O Afib/flutter (was on Xarelto)- now rvr>improved rate control  HTN  P: Cardiac Monitoring Continue Coreg at half dose Continue Cardizem po  Restart home xarelto on 1/19   RENAL  Recent Labs Lab 12/05/16 0356 12/05/16 1626 12/06/16 0250  K 3.3* 4.0 3.3*    A:   Hypokalemia Hypernatremia  P:  Continue free water to get back to goal na 154 Trend BMP, Repeat BMP at 1700  Replace electrolytes as needed  Cont  D5 to 50 ml/hr Cont  free water to 200 q4h K+ replaced   GASTROINTESTINAL A:   At risk for aspiration  dysphagia P:   TF PPI  HEMATOLOGIC A:  On Chronic Anticoagulation (FFP administered in ED)  P:  SCD  Trend CBC   INFECTIOUS A:   Asp less likely  P:   Trend WBC and Fever Curve   ENDOCRINE CBG (last 3)   Recent Labs  12/05/16 2351 12/06/16 0351 12/06/16 0754  GLUCAP 208* 187* 169*    A:   DM, controlled P:   SSI  TF coverage  Lantus 25 u daily   NEUROLOGIC A:   Interhemispheric SDH  -recent falls and weakness to right lower extremity  R side weakness Status Epilepticus not seen on eeg Recent Cocaine Use  EEG -enceph MRI NSC 1/7 P:   PRN Ativan  Continue Prop  Continue Keppra and Dilantin per neurology  RASS goal: 0  FAMILY  - Updates:, no family at  bedside   - Inter-disciplinary family meet or Palliative Care meeting due by: 1/11  Tammy Parrett NP-C  [facility]  [phone]   12/06/16

## 2016-12-07 ENCOUNTER — Inpatient Hospital Stay (HOSPITAL_COMMUNITY): Payer: PPO

## 2016-12-07 LAB — CBC
HEMATOCRIT: 39.7 % (ref 39.0–52.0)
Hemoglobin: 12.1 g/dL — ABNORMAL LOW (ref 13.0–17.0)
MCH: 29.4 pg (ref 26.0–34.0)
MCHC: 30.5 g/dL (ref 30.0–36.0)
MCV: 96.4 fL (ref 78.0–100.0)
Platelets: 324 10*3/uL (ref 150–400)
RBC: 4.12 MIL/uL — ABNORMAL LOW (ref 4.22–5.81)
RDW: 18 % — AB (ref 11.5–15.5)
WBC: 8 10*3/uL (ref 4.0–10.5)

## 2016-12-07 LAB — BASIC METABOLIC PANEL
ANION GAP: 8 (ref 5–15)
Anion gap: 11 (ref 5–15)
BUN: 101 mg/dL — AB (ref 6–20)
BUN: 128 mg/dL — AB (ref 6–20)
CALCIUM: 7.5 mg/dL — AB (ref 8.9–10.3)
CHLORIDE: 117 mmol/L — AB (ref 101–111)
CO2: 25 mmol/L (ref 22–32)
CO2: 23 mmol/L (ref 22–32)
CREATININE: 2 mg/dL — AB (ref 0.61–1.24)
Calcium: 7.9 mg/dL — ABNORMAL LOW (ref 8.9–10.3)
Chloride: 112 mmol/L — ABNORMAL HIGH (ref 101–111)
Creatinine, Ser: 2.78 mg/dL — ABNORMAL HIGH (ref 0.61–1.24)
GFR calc Af Amer: 41 mL/min — ABNORMAL LOW (ref >60)
GFR calc Af Amer: 27 mL/min — ABNORMAL LOW (ref >60)
GFR calc non Af Amer: 35 mL/min — ABNORMAL LOW (ref >60)
GFR calc non Af Amer: 24 mL/min — ABNORMAL LOW (ref >60)
GLUCOSE: 308 mg/dL — AB (ref 65–99)
Glucose, Bld: 152 mg/dL — ABNORMAL HIGH (ref 65–99)
POTASSIUM: 3.5 mmol/L (ref 3.5–5.1)
Potassium: 3.6 mmol/L (ref 3.5–5.1)
Sodium: 145 mmol/L (ref 135–145)
Sodium: 151 mmol/L — ABNORMAL HIGH (ref 135–145)

## 2016-12-07 LAB — GLUCOSE, CAPILLARY
GLUCOSE-CAPILLARY: 127 mg/dL — AB (ref 65–99)
Glucose-Capillary: 200 mg/dL — ABNORMAL HIGH (ref 65–99)
Glucose-Capillary: 155 mg/dL — ABNORMAL HIGH (ref 65–99)
Glucose-Capillary: 148 mg/dL — ABNORMAL HIGH (ref 65–99)
Glucose-Capillary: 114 mg/dL — ABNORMAL HIGH (ref 65–99)

## 2016-12-07 LAB — TRIGLYCERIDES: Triglycerides: 489 mg/dL — ABNORMAL HIGH (ref <150)

## 2016-12-07 MED ORDER — FUROSEMIDE 10 MG/ML IJ SOLN
20.0000 mg | Freq: Once | INTRAMUSCULAR | Status: AC
  Administered 2016-12-07: 20 mg via INTRAVENOUS
  Filled 2016-12-07: qty 2

## 2016-12-07 MED ORDER — INSULIN GLARGINE 100 UNIT/ML ~~LOC~~ SOLN
15.0000 [IU] | Freq: Two times a day (BID) | SUBCUTANEOUS | Status: DC
  Administered 2016-12-07 – 2016-12-10 (×6): 15 [IU] via SUBCUTANEOUS
  Filled 2016-12-07 (×7): qty 0.15

## 2016-12-07 MED ORDER — FREE WATER: 150.0000 mL | Freq: Three times a day (TID) | Status: DC

## 2016-12-07 NOTE — Progress Notes (Signed)
Overall stable. Patient remained sedated on ventilator. Still with significant right-sided hemiparesis.  No new recommendations her thoughts of this point. Continue supportive efforts.

## 2016-12-07 NOTE — Progress Notes (Signed)
PULMONARY / CRITICAL CARE MEDICINE   Name: Jeremiah Perkins MRN: [id number] DOB: Apr 26, 1961    ADMISSION DATE:  12-18-2016 CONSULTATION DATE:  2016/12/18  REFERRING MD:  Dr. Allena Katz   CHIEF COMPLAINT: SDH   Brief:   56 year old male with PMH of DM, COPD, CHF, OSA (on CPAP at HS), atrial flutter (on xarelto), and polysubstance abuse (patient report used cocaine last night), denies ETOH. Present to ED on 1/3 with new SDH. Transferred to ICU and ultimately required intubation.    SUBJECTIVE:  Stable , no new events.  Weaning some this am. Not following commands.    VITAL SIGNS: BP (!) 90/58   Pulse 88   Temp 98.7 F (37.1 C) (Axillary)   Resp (!) 27   Ht 5\' 7"  (1.702 m)   Wt 119.1 kg (262 lb 9.1 oz)   SpO2 94%   BMI 41.12 kg/m   HEMODYNAMICS:    VENTILATOR SETTINGS: Vent Mode: CPAP;PSV FiO2 (%):  [30 %] 30 % Set Rate:  [18 bmp] 18 bmp Vt Set:  [530 mL] 530 mL PEEP:  [5 cmH20] 5 cmH20 Pressure Support:  [12 cmH20] 12 cmH20 Plateau Pressure:  [16 cmH20-21 cmH20] 16 cmH20  INTAKE / OUTPUT: I/O last 3 completed shifts: In: 4417.5 [I.V.:1597.5; NG/GT:2500; IV Piggyback:320] Out: 3025 [Urine:2725; Stool:300]  PHYSICAL EXAMINATION: General: Adult male, lying in bed, on vent  Neuro: Not following commands,  right side flaccid  HEENT: ETT in place  Cardiovascular: NI S1/S2, no MRG, aflutter - regular rate  Lungs: on vent, unlabored, clear breath sounds, diminished at bases  Abdomen: non-tender, obese, active bs  Musculoskeletal: no deformities Skin: warm, dry, intact     LABS:  BMET  Recent Labs Lab 12/05/16 1626 12/06/16 0250 12/07/16 0600  NA 156* 154* 145  K 4.0 3.3* 3.6  CL 120* 119* 112*  CO2 25 26 25   BUN 86* 87* 101*  CREATININE 1.51* 1.41* 2.00*  GLUCOSE 237* 206* 308*    Electrolytes  Recent Labs Lab 12/03/16 0436  12/05/16 0356 12/05/16 1626 12/06/16 0250 12/07/16 0600  CALCIUM 9.0  < > 8.6* 8.3* 8.1* 7.5*  MG 2.4  --  2.7*  --  2.6*   --   PHOS 3.7  --  3.1  --  3.5  --   < > = values in this interval not displayed.  CBC  Recent Labs Lab 12/05/16 0356 12/06/16 0250 12/07/16 0600  WBC 6.5 6.7 8.0  HGB 13.5 13.0 12.1*  HCT 45.0 42.5 39.7  PLT 352 359 324    Coag's No results for input(s): APTT, INR in the last 168 hours.  Sepsis Markers  Recent Labs Lab 12/01/16 0510  PROCALCITON 2.05    ABG No results for input(s): PHART, PCO2ART, PO2ART in the last 168 hours.  Liver Enzymes No results for input(s): AST, ALT, ALKPHOS, BILITOT, ALBUMIN in the last 168 hours.  Cardiac Enzymes No results for input(s): TROPONINI, PROBNP in the last 168 hours.  Glucose  Recent Labs Lab 12/06/16 1205 12/06/16 1535 12/06/16 2022 12/06/16 2340 12/07/16 0339 12/07/16 0737  GLUCAP 141* 135* 155* 220* 200* 155*    Imaging Dg Chest Port 1 View  Result Date: 12/07/2016 CLINICAL DATA:  Respiratory failure EXAM: PORTABLE CHEST 1 VIEW COMPARISON:  One-view chest x-ray 12/06/2016. FINDINGS: Endotracheal tube and left-sided PICC line are stable. The NG tube courses off the inferior border of the film. The heart is enlarged. Interstitial edema and bilateral pleural effusions have slightly  increased. Right lower lobe airspace disease is increasing. IMPRESSION: 1. Cardiomegaly with increasing interstitial edema and right greater than left pleural effusions compatible with congestive heart failure. 2. Increasing right lower lobe airspace disease likely reflects atelectasis. 3. The support apparatus is stable. Electronically Signed   By: Marin Roberts M.D.   On: 12/07/2016 09:33   Dg Chest Port 1 View  Result Date: 12/06/2016 CLINICAL DATA:  Left-sided PICC line placement EXAM: PORTABLE CHEST 1 VIEW COMPARISON:  12/06/2016, earlier the same day FINDINGS: 1848 hours. Endotracheal tube tip is 3.9 cm above the base of the carina. The NG tube passes into the stomach although the distal tip position is not included on the film.  Left PICC line tip is positioned over the right heart, projecting at or just below the SVC/RA junction. The cardio pericardial silhouette is enlarged. Pulmonary edema pattern seen previously shows fairly marked interval improvement. The visualized bony structures of the thorax are intact. Telemetry leads overlie the chest. IMPRESSION: Right PICC line tip overlies the region of the SVC/RA junction, potentially overlying the upper right atrium. Marked improvement in pulmonary edema pattern seen previously. Electronically Signed   By: Kennith Center M.D.   On: 12/06/2016 19:06    STUDIES:  Echo Jan 2017 > LVEF 35-40% with regional wall motion abnormalities,akinesis of the anteroseptal and apical myocardium CT Head 1/4 > large falcine subdural hematoma measuring up to 19 mm thick unchanged, Minimal subarachnoid hemorrhage at sulci along the superior aspect of the interhemispheric fissure. No intracranial mass, intracerebral hemorrhage or evidence of acute infarction CXR 1/4 > Interstitial and alveolar pulmonary edema pattern without definite pleural effusions EEG 1/5 > Diffuse slowing of background, no seizures seen  EEG 1/6 > generalized polymorphic delta slowing, suggesting severe encephalopathy, no seizures  MRI Head 1/7 > Stable parafalcine and left tentorium subdural hematoma as well as parafalcine subarachnoid hemorrhage Echo 1/20 > 35-40%, reduced systolic function   CULTURES: UA 1/4 > Neg Urine 1/4 > neg Blood 1/4 > Neg  Flu 1/4 > Neg  ANTIBIOTICS: Rocephin 1/4 >1/4  1/6 > Vancomycin 1/4 >1/7 Zosyn 1/4 > 1/7  SIGNIFICANT EVENTS: 1/4 > Presents to ED with weakness and headache  1/5 > Intubated   LINES/TUBES: ETT 1/4 >>  R PICC 1/13>>   ASSESSMENT / PLAN:  PULMONARY A: Acute on Chronic Respiratory Failure  H/O COPD, OSA (on CPAP at HS)  pulm edema-worse aeration 1/14  P:   Trend CXR  Vent Support - Wean as tolerated  Weaning as able  May need trach next week if unable to  wean,  Continue Pulmicort and Xopenex   CARDIOVASCULAR A: Hypotension - intermittent  CHF  H/O Afib/flutter (was on Xarelto)- now rvr>improved rate control  HTN  P: Cardiac Monitoring Continue Coreg at half dose Continue Cardizem po  Restart home xarelto on 1/19 if clinically indicated  Lasix 20mg  IV x 1   RENAL  Recent Labs Lab 12/05/16 1626 12/06/16 0250 12/07/16 0600  K 4.0 3.3* 3.6    A:   Hypokalemia -improved  Hypernatremia -748>270>786  P:  Trend BMP  Replace electrolytes as needed  D/c D5 .and free water   GASTROINTESTINAL A:   At risk for aspiration  dysphagia P:   TF PPI  HEMATOLOGIC A:  On Chronic Anticoagulation (FFP administered in ED)  P:  SCD  Trend CBC   INFECTIOUS A:   Asp less likely  1/14 intermittent fever, WBC ok , CXR ?more edema pic  P:  Trend WBC and Fever Curve   ENDOCRINE CBG (last 3)   Recent Labs  12/06/16 2340 12/07/16 0339 12/07/16 0737  GLUCAP 220* 200* 155*    A:   DM, controlled P:   SSI  TF coverage  Increase Lantus 15 u Twice daily    NEUROLOGIC A:   Interhemispheric SDH  -recent falls and weakness to right lower extremity  R side weakness Status Epilepticus not seen on eeg Recent Cocaine Use  EEG -enceph MRI NSC 1/7 P:   PRN Ativan  Continue Prop  Continue Keppra and Dilantin per neurology  RASS goal: 0  FAMILY  - Updates:, no family at bedside  Spoke with wife on phone 1/13, updated. Discussed if not weaning may need trach.   - Inter-disciplinary family meet or Palliative Care meeting due by: 1/11  Tammy Parrett NP-C  [facility]  [phone]   12/07/16

## 2016-12-08 ENCOUNTER — Inpatient Hospital Stay (HOSPITAL_COMMUNITY): Payer: PPO

## 2016-12-08 DIAGNOSIS — J9601 Acute respiratory failure with hypoxia: Secondary | ICD-10-CM

## 2016-12-08 LAB — GLUCOSE, CAPILLARY
GLUCOSE-CAPILLARY: 152 mg/dL — AB (ref 65–99)
GLUCOSE-CAPILLARY: 171 mg/dL — AB (ref 65–99)
Glucose-Capillary: 141 mg/dL — ABNORMAL HIGH (ref 65–99)
Glucose-Capillary: 178 mg/dL — ABNORMAL HIGH (ref 65–99)
Glucose-Capillary: 148 mg/dL — ABNORMAL HIGH (ref 65–99)
Glucose-Capillary: 168 mg/dL — ABNORMAL HIGH (ref 65–99)
Glucose-Capillary: 168 mg/dL — ABNORMAL HIGH (ref 65–99)

## 2016-12-08 LAB — CBC
HCT: 40.8 % (ref 39.0–52.0)
HEMOGLOBIN: 12.7 g/dL — AB (ref 13.0–17.0)
MCH: 29.3 pg (ref 26.0–34.0)
MCHC: 31.1 g/dL (ref 30.0–36.0)
MCV: 94 fL (ref 78.0–100.0)
Platelets: 302 10*3/uL (ref 150–400)
RBC: 4.34 MIL/uL (ref 4.22–5.81)
RDW: 17.9 % — ABNORMAL HIGH (ref 11.5–15.5)
WBC: 7 10*3/uL (ref 4.0–10.5)

## 2016-12-08 LAB — BASIC METABOLIC PANEL
Anion gap: 14 (ref 5–15)
BUN: 151 mg/dL — AB (ref 6–20)
CO2: 23 mmol/L (ref 22–32)
Calcium: 8 mg/dL — ABNORMAL LOW (ref 8.9–10.3)
Chloride: 114 mmol/L — ABNORMAL HIGH (ref 101–111)
Creatinine, Ser: 3.49 mg/dL — ABNORMAL HIGH (ref 0.61–1.24)
GFR calc Af Amer: 21 mL/min — ABNORMAL LOW (ref >60)
GFR calc non Af Amer: 18 mL/min — ABNORMAL LOW (ref >60)
Glucose, Bld: 149 mg/dL — ABNORMAL HIGH (ref 65–99)
POTASSIUM: 3.8 mmol/L (ref 3.5–5.1)
Sodium: 151 mmol/L — ABNORMAL HIGH (ref 135–145)

## 2016-12-08 MED ORDER — POTASSIUM CHLORIDE 20 MEQ/15ML (10%) PO SOLN
20.0000 meq | Freq: Once | ORAL | Status: AC
  Administered 2016-12-08: 20 meq
  Filled 2016-12-08: qty 15

## 2016-12-08 MED ORDER — FREE WATER
200.0000 mL | Freq: Three times a day (TID) | Status: DC
  Administered 2016-12-08 – 2016-12-13 (×13): 200 mL

## 2016-12-08 MED ORDER — FAMOTIDINE IN NACL 20-0.9 MG/50ML-% IV SOLN
20.0000 mg | INTRAVENOUS | Status: DC
  Administered 2016-12-08: 20 mg via INTRAVENOUS
  Filled 2016-12-08: qty 50

## 2016-12-08 MED ORDER — IPRATROPIUM BROMIDE 0.02 % IN SOLN
0.5000 mg | Freq: Four times a day (QID) | RESPIRATORY_TRACT | Status: DC
  Administered 2016-12-08 – 2016-12-16 (×33): 0.5 mg via RESPIRATORY_TRACT
  Filled 2016-12-08 (×33): qty 2.5

## 2016-12-08 NOTE — Progress Notes (Signed)
Sputum sample obtained and sent to lab per order.

## 2016-12-08 NOTE — Progress Notes (Signed)
PULMONARY / CRITICAL CARE MEDICINE   Name: Jeremiah Perkins MRN: [id number] DOB: [birth year]    ADMISSION DATE:  2016/12/07 CONSULTATION DATE:  Dec 07, 2016  REFERRING MD:  Dr. Allena Katz   CHIEF COMPLAINT: SDH   Brief:   56 year old male with PMH of DM, COPD, CHF, OSA (on CPAP at HS), atrial flutter (on xarelto), and polysubstance abuse (patient report used cocaine night before admit). Present to ED on 1/3 with new SDH and seizures. Transferred to ICU and ultimately required intubation. Has been slow to improve since that time and remains on ventilator. No further seizures.   SUBJECTIVE:  Developed fevers overnight, otherwise no events.   VITAL SIGNS: BP 114/65 (BP Location: Right Arm)   Pulse 93   Temp (!) 101.5 F (38.6 C) (Axillary)   Resp (!) 25   Ht 5\' 7"  (1.702 m)   Wt 123 kg (271 lb 2.7 oz)   SpO2 99%   BMI 42.47 kg/m   HEMODYNAMICS:    VENTILATOR SETTINGS: Vent Mode: CPAP;PSV FiO2 (%):  [30 %] 30 % Set Rate:  [18 bmp] 18 bmp Vt Set:  [530 mL] 530 mL PEEP:  [5 cmH20] 5 cmH20 Pressure Support:  [12 cmH20] 12 cmH20 Plateau Pressure:  [16 cmH20-22 cmH20] 22 cmH20  INTAKE / OUTPUT: I/O last 3 completed shifts: In: 2644.3 [I.V.:664.3; NG/GT:1500; IV Piggyback:480] Out: 2010 [Urine:1710; Stool:300]  PHYSICAL EXAMINATION: General: Morbidly obese male in bed, no acute distress Neuro: Not following commands, Involuntary movement L side, right side flaccid  HEENT: [place]/AT, PERRL, ETT in place  Cardiovascular: S1/S2, no MRG, aflutter - regular rate  Lungs: Vent assisted breaths, diminished bases Abdomen: soft, non-tender, non-distended Musculoskeletal: no acute deformity Skin: Grossly intact, warm, dry  LABS:  BMET  Recent Labs Lab 12/07/16 0600 12/07/16 1800 12/08/16 0600  NA 145 151* 151*  K 3.6 3.5 3.8  CL 112* 117* 114*  CO2 25 23 23   BUN 101* 128* 151*  CREATININE 2.00* 2.78* 3.49*  GLUCOSE 308* 152* 149*    Electrolytes  Recent Labs Lab  12/03/16 0436  12/05/16 0356  12/06/16 0250 12/07/16 0600 12/07/16 1800 12/08/16 0600  CALCIUM 9.0  < > 8.6*  < > 8.1* 7.5* 7.9* 8.0*  MG 2.4  --  2.7*  --  2.6*  --   --   --   PHOS 3.7  --  3.1  --  3.5  --   --   --   < > = values in this interval not displayed.  CBC  Recent Labs Lab 12/06/16 0250 12/07/16 0600 12/08/16 0600  WBC 6.7 8.0 7.0  HGB 13.0 12.1* 12.7*  HCT 42.5 39.7 40.8  PLT 359 324 302    Coag's No results for input(s): APTT, INR in the last 168 hours.  Sepsis Markers No results for input(s): LATICACIDVEN, PROCALCITON, O2SATVEN in the last 168 hours.  ABG No results for input(s): PHART, PCO2ART, PO2ART in the last 168 hours.  Liver Enzymes No results for input(s): AST, ALT, ALKPHOS, BILITOT, ALBUMIN in the last 168 hours.  Cardiac Enzymes No results for input(s): TROPONINI, PROBNP in the last 168 hours.  Glucose  Recent Labs Lab 12/07/16 0737 12/07/16 1559 12/07/16 1954 12/07/16 2318 12/08/16 0331 12/08/16 0806  GLUCAP 155* 127* 148* 114* 178* 148*    Imaging Dg Chest Port 1 View  Result Date: 12/08/2016 CLINICAL DATA:  56 year old male with respiratory failure. Intracranial hemorrhage. Initial encounter. EXAM: PORTABLE CHEST 1 VIEW COMPARISON:  12/07/2016 and  earlier. FINDINGS: Portable AP semi upright view at at 0559 hours. Endotracheal tube tip is at the level the clavicles and in good position. Left upper extremity PICC line in place. Enteric tube courses toward the abdomen, tip not included. Stable cardiomegaly and mediastinal contours. Stable lung volumes. Mildly increased pulmonary vascular congestion since yesterday with no overt edema. No pneumothorax. Increased retrocardiac opacity, a obscuration now of the low left hemidiaphragm. No other confluent pulmonary opacity. IMPRESSION: 1.  Stable lines and tubes. 2. Stable lung volumes. Increased pulmonary vascular congestion and left lower lobe collapse or consolidation since yesterday.  Electronically Signed   By: Odessa Fleming M.D.   On: 12/08/2016 08:03    STUDIES:  Echo Jan 2017 > LVEF 35-40% with regional wall motion abnormalities,akinesis of the anteroseptal and apical myocardium CT Head 1/4 > large falcine subdural hematoma measuring up to 19 mm thick unchanged, Minimal subarachnoid hemorrhage at sulci along the superior aspect of the interhemispheric fissure. No intracranial mass, intracerebral hemorrhage or evidence of acute infarction CXR 1/4 > Interstitial and alveolar pulmonary edema pattern without definite pleural effusions EEG 1/5 > Diffuse slowing of background, no seizures seen  EEG 1/6 > generalized polymorphic delta slowing, suggesting severe encephalopathy, no seizures  MRI Head 1/7 > Stable parafalcine and left tentorium subdural hematoma as well as parafalcine subarachnoid hemorrhage Echo 1/20 > 35-40%, reduced systolic function   CULTURES: UA 1/4 > Neg Urine 1/4 > neg Blood 1/4 > Neg  Flu 1/4 > Neg  ANTIBIOTICS: Rocephin 1/4 >1/4  1/6 > Vancomycin 1/4 >1/7 Zosyn 1/4 > 1/7  SIGNIFICANT EVENTS: 1/4 > Presents to ED with weakness and headache  1/5 > Intubated   LINES/TUBES: ETT 1/4 >>  R PICC 1/13>>   ASSESSMENT / PLAN:  PULMONARY A: Acute on Chronic Respiratory Failure  H/O COPD, OSA (on CPAP at HS)  pulm edema-worse aeration 1/14  P:   Vent Support - Wean as tolerated (not tolerating well today. RR high 30s) Will likely require tracheostomy sometime this week.  Continue pulmicort and xopenex.  CARDIOVASCULAR A: Hypotension - intermittent  CHF  H/O Afib/flutter (was on Xarelto)- was rvr>improved rate control  HTN  P: Telemetry Continue half dose coreg Continue PO diltiazem Restart home xarelto on 1/19 if clinically indicated   RENAL  Recent Labs Lab 12/07/16 0600 12/07/16 1800 12/08/16 0600  K 3.6 3.5 3.8    A:   Hypokalemia -improved  Hypernatremia -156>154>145 > 151 P:  Trend BMP q 12 hours Replace electrolytes  as needed  Restart free water  GASTROINTESTINAL A:   At risk for aspiration  dysphagia P:   TF PPI  HEMATOLOGIC A:  On Chronic Anticoagulation (FFP administered in ED)  P:  SCD  Trend CBC  Continue to hold Xarelto as above  INFECTIOUS A:   Fevers 1/14-1/15. WBC wnl. Questionable LLL opacification ? PNA.   P:   Trend WBC and Fever Curve  Pan culture Hold off ABX for now  ENDOCRINE CBG (last 3)   Recent Labs  12/07/16 2318 12/08/16 0331 12/08/16 0806  GLUCAP 114* 178* 148*    A:   DM, controlled P:   SSI  TF coverage  Increase Lantus 15 u Twice daily    NEUROLOGIC A:   Interhemispheric SDH  -recent falls and weakness to right lower extremity  R side weakness Status Epilepticus not seen on eeg Recent Cocaine Use  EEG -enceph MRI NSC 1/7 P:   PRN Ativan  Continue Prop  Continue Keppra and Dilantin per neurology  RASS goal: 0  FAMILY  - Updates:, no family at bedside, wife updated re: likely need for trach 1/13  - Inter-disciplinary family meet or Palliative Care meeting due by: 1/11  APP critical care time: 35 mins  Joneen Roach, AGACNP-BC [facility] Pager [phone] or [phone]  12/08/2016 9:25 AM

## 2016-12-09 ENCOUNTER — Inpatient Hospital Stay (HOSPITAL_COMMUNITY): Payer: PPO

## 2016-12-09 ENCOUNTER — Ambulatory Visit: Payer: PPO

## 2016-12-09 LAB — BLOOD CULTURE ID PANEL (REFLEXED)
Acinetobacter baumannii: NOT DETECTED
CANDIDA GLABRATA: NOT DETECTED
CANDIDA KRUSEI: NOT DETECTED
CANDIDA PARAPSILOSIS: NOT DETECTED
Candida albicans: NOT DETECTED
Candida tropicalis: NOT DETECTED
ENTEROBACTER CLOACAE COMPLEX: NOT DETECTED
ESCHERICHIA COLI: NOT DETECTED
Enterobacteriaceae species: NOT DETECTED
Enterococcus species: NOT DETECTED
Haemophilus influenzae: NOT DETECTED
KLEBSIELLA OXYTOCA: NOT DETECTED
KLEBSIELLA PNEUMONIAE: NOT DETECTED
LISTERIA MONOCYTOGENES: NOT DETECTED
Methicillin resistance: DETECTED — AB
Neisseria meningitidis: NOT DETECTED
Proteus species: NOT DETECTED
Pseudomonas aeruginosa: NOT DETECTED
SERRATIA MARCESCENS: NOT DETECTED
STREPTOCOCCUS AGALACTIAE: NOT DETECTED
Staphylococcus aureus (BCID): NOT DETECTED
Staphylococcus species: DETECTED — AB
Streptococcus pneumoniae: NOT DETECTED
Streptococcus pyogenes: NOT DETECTED
Streptococcus species: NOT DETECTED

## 2016-12-09 LAB — GLUCOSE, CAPILLARY
GLUCOSE-CAPILLARY: 138 mg/dL — AB (ref 65–99)
GLUCOSE-CAPILLARY: 182 mg/dL — AB (ref 65–99)
GLUCOSE-CAPILLARY: 219 mg/dL — AB (ref 65–99)
Glucose-Capillary: 191 mg/dL — ABNORMAL HIGH (ref 65–99)
Glucose-Capillary: 181 mg/dL — ABNORMAL HIGH (ref 65–99)
Glucose-Capillary: 227 mg/dL — ABNORMAL HIGH (ref 65–99)

## 2016-12-09 LAB — BASIC METABOLIC PANEL
ANION GAP: 13 (ref 5–15)
BUN: 195 mg/dL — AB (ref 6–20)
CO2: 20 mmol/L — AB (ref 22–32)
Calcium: 7.7 mg/dL — ABNORMAL LOW (ref 8.9–10.3)
Chloride: 114 mmol/L — ABNORMAL HIGH (ref 101–111)
Creatinine, Ser: 5 mg/dL — ABNORMAL HIGH (ref 0.61–1.24)
GFR calc Af Amer: 14 mL/min — ABNORMAL LOW (ref >60)
GFR calc non Af Amer: 12 mL/min — ABNORMAL LOW (ref >60)
GLUCOSE: 178 mg/dL — AB (ref 65–99)
POTASSIUM: 4 mmol/L (ref 3.5–5.1)
Sodium: 147 mmol/L — ABNORMAL HIGH (ref 135–145)

## 2016-12-09 LAB — CBC
HEMATOCRIT: 39.7 % (ref 39.0–52.0)
HEMOGLOBIN: 12.4 g/dL — AB (ref 13.0–17.0)
MCH: 28.9 pg (ref 26.0–34.0)
MCHC: 31.2 g/dL (ref 30.0–36.0)
MCV: 92.5 fL (ref 78.0–100.0)
Platelets: 288 10*3/uL (ref 150–400)
RBC: 4.29 MIL/uL (ref 4.22–5.81)
RDW: 18.2 % — AB (ref 11.5–15.5)
WBC: 7 10*3/uL (ref 4.0–10.5)

## 2016-12-09 LAB — URINE CULTURE: Culture: 40000 — AB

## 2016-12-09 LAB — PHOSPHORUS: Phosphorus: 5.3 mg/dL — ABNORMAL HIGH (ref 2.5–4.6)

## 2016-12-09 MED ORDER — VITAL HIGH PROTEIN PO LIQD
1000.0000 mL | ORAL | Status: DC
  Administered 2016-12-09 – 2016-12-16 (×8): 1000 mL

## 2016-12-09 MED ORDER — PRO-STAT SUGAR FREE PO LIQD
30.0000 mL | Freq: Every day | ORAL | Status: DC
  Administered 2016-12-09 – 2016-12-16 (×30): 30 mL
  Filled 2016-12-09 (×29): qty 30

## 2016-12-09 MED ORDER — SODIUM CHLORIDE 0.9 % IV SOLN
10.0000 mg | INTRAVENOUS | Status: DC
  Administered 2016-12-09 – 2016-12-15 (×7): 10 mg via INTRAVENOUS
  Filled 2016-12-09 (×8): qty 1

## 2016-12-09 MED ORDER — FUROSEMIDE 10 MG/ML IJ SOLN
40.0000 mg | Freq: Two times a day (BID) | INTRAMUSCULAR | Status: DC
  Administered 2016-12-09: 40 mg via INTRAVENOUS
  Filled 2016-12-09: qty 4

## 2016-12-09 MED ORDER — VANCOMYCIN HCL 10 G IV SOLR
2000.0000 mg | Freq: Once | INTRAVENOUS | Status: AC
  Administered 2016-12-09: 2000 mg via INTRAVENOUS
  Filled 2016-12-09: qty 2000

## 2016-12-09 MED ORDER — SODIUM CHLORIDE 0.9 % IV SOLN
500.0000 mg | Freq: Two times a day (BID) | INTRAVENOUS | Status: DC
  Administered 2016-12-09 – 2016-12-13 (×9): 500 mg via INTRAVENOUS
  Filled 2016-12-09 (×10): qty 5

## 2016-12-09 MED ORDER — CARVEDILOL 3.125 MG PO TABS
3.1250 mg | ORAL_TABLET | Freq: Two times a day (BID) | ORAL | Status: DC
  Administered 2016-12-09: 3.125 mg via ORAL
  Filled 2016-12-09: qty 1

## 2016-12-09 NOTE — Progress Notes (Signed)
Pharmacy Antibiotic Note  Jeremiah Perkins is a 56 y.o. male admitted on 12/21/2016 with bacteremia.  Pharmacy has been consulted for vancomycin dosing. Tmax is 102.1 and WBC is WNL. SCr has been rapidly worsening the last few days and is now up to 5.   Plan: Vancomycin 2gm IV x 1 - will likely need to dose based on levels due to fluctuating Scr F/u renal fxn, C&S, clinical status and trough at SS  Height: 5\' 7"  (170.2 cm) Weight: 268 lb 1.3 oz (121.6 kg) IBW/kg (Calculated) : 66.1  Temp (24hrs), Avg:99.9 F (37.7 C), Min:97.8 F (36.6 C), Max:102.1 F (38.9 C)   Recent Labs Lab 12/05/16 0356  12/06/16 0250 12/07/16 0600 12/07/16 1800 12/08/16 0600 12/09/16 0545  WBC 6.5  --  6.7 8.0  --  7.0 7.0  CREATININE 1.28*  < > 1.41* 2.00* 2.78* 3.49* 5.00*  < > = values in this interval not displayed.  Estimated Creatinine Clearance: 20.4 mL/min (by C-G formula based on SCr of 5 mg/dL (H)).    No Known Allergies  Antimicrobials this admission: Zosyn 1/4>>1/6 Vanc 1/4>>1/5; 1/16>> CTX x 1 1/4; 1/6>>1/10  Dose adjustments this admission: N/A  Microbiology results: 1/4 BCx: NGTD 1/4 UCxx2: NEG 1/4 Resp PCR: neg  1/4 MRSA PCR: neg 1/15 TA - abundant GNR, few GPC 1/15 Blood - 2/2 CoNS  Thank you for allowing pharmacy to be a part of this patient's care.  Tanita Palinkas, Drake Leach 12/09/2016 10:25 AM

## 2016-12-09 NOTE — Progress Notes (Addendum)
Nutrition Follow-up  DOCUMENTATION CODES:   Morbid obesity  INTERVENTION:   Increase Vital High Protein to 45 ml/hr (1080 ml/day) Decrease Prostat to 30 ml five times per day Provides: 1580 kcal, 169 grams protein, 902 ml H2O.  Total free water: 1502 ml  NUTRITION DIAGNOSIS:   Inadequate oral intake related to inability to eat as evidenced by NPO status. Ongoing.   GOAL:   Provide needs based on ASPEN/SCCM guidelines  Progressing.   MONITOR:   TF tolerance, Vent status, Labs  ASSESSMENT:   56 year old male with PMH of DM, COPD, CHF, OSA (on CPAP at HS), atrial flutter (on xarelto), and polysubstance abuse (patient report used cocaine last night), denies ETOH. Present to ED on 1/3 with new SDH.  Pt discussed during ICU rounds and with RN.  Per MD has not weaned well, may need trach.  Patient is currently intubated on ventilator support  Propofol: off Free water 200 ml every 8 hours Na 147, BUN 195 CBG's: [phone]  Diet Order:  Diet NPO time specified Vital High Protein @ 25 ml/hr (600 ml/day) 60 ml Prostat QID  Skin:  Reviewed, no issues  Last BM:  1/14 300 ml via rectal tube  Height:   Ht Readings from Last 1 Encounters:  11/28/2016 5\' 7"  (1.702 m)    Weight:   Wt Readings from Last 1 Encounters:  12/09/16 268 lb 1.3 oz (121.6 kg)    Ideal Body Weight:  67.2 kg  BMI:  Body mass index is 41.99 kg/m.  Estimated Nutritional Needs:   Kcal:  1351-1720  Protein:  >/= 168 grams  Fluid:  > 1.5 L/day  EDUCATION NEEDS:   No education needs identified at this time  Kendell Bane RD, LDN, CNSC [phone] Pager [phone] After Hours Pager

## 2016-12-09 NOTE — Progress Notes (Signed)
PULMONARY / CRITICAL CARE MEDICINE   Name: Jeremiah Perkins MRN: [id number] DOB: 13-Sep-1961    ADMISSION DATE:  Dec 19, 2016 CONSULTATION DATE:  Dec 19, 2016  REFERRING MD:  Dr. Allena Katz   CHIEF COMPLAINT: SDH   Brief:   56 year old male with PMH of DM, COPD, CHF, OSA (on CPAP at HS), atrial flutter (on xarelto), and polysubstance abuse (patient report used cocaine night before admit). Present to ED on 1/3 with new SDH and seizures. Transferred to ICU and ultimately required intubation. Has been slow to improve since that time and remains on ventilator. No further seizures.   SUBJECTIVE:  Blood cultures concerning for staph in aerobic bottle. Remains febrile. WBC wnl. Failing SBT   VITAL SIGNS: BP (!) 104/56   Pulse 86   Temp (!) 102.1 F (38.9 C) (Axillary)   Resp (!) 31   Ht 5\' 7"  (1.702 m)   Wt 121.6 kg (268 lb 1.3 oz)   SpO2 97%   BMI 41.99 kg/m   HEMODYNAMICS:    VENTILATOR SETTINGS: Vent Mode: CPAP;PSV FiO2 (%):  [30 %] 30 % Set Rate:  [18 bmp] 18 bmp Vt Set:  [530 mL] 530 mL PEEP:  [5 cmH20] 5 cmH20 Pressure Support:  [12 cmH20-14 cmH20] 14 cmH20 Plateau Pressure:  [20 cmH20-21 cmH20] 21 cmH20  INTAKE / OUTPUT: I/O last 3 completed shifts: In: 1857.6 [I.V.:347.6; NG/GT:1080; IV Piggyback:430] Out: 985 [Urine:985]  PHYSICAL EXAMINATION:  General:  Obese male in NAD on vent Neuro:  Opens eyes intermittently but not to command. L arm non purposeful. No movement on R.  HEENT:  [place]/AT, No JVD noted, PERRL Cardiovascular:  Aflutter on monitor, rate controlled. Lungs:  Diminished bases Abdomen:  Soft, non-distended Musculoskeletal:  No acute deformity Skin:  Intact, MMM  LABS:  BMET  Recent Labs Lab 12/07/16 1800 12/08/16 0600 12/09/16 0545  NA 151* 151* 147*  K 3.5 3.8 4.0  CL 117* 114* 114*  CO2 23 23 20*  BUN 128* 151* 195*  CREATININE 2.78* 3.49* 5.00*  GLUCOSE 152* 149* 178*    Electrolytes  Recent Labs Lab 12/03/16 0436  12/05/16 0356   12/06/16 0250  12/07/16 1800 12/08/16 0600 12/09/16 0545  CALCIUM 9.0  < > 8.6*  < > 8.1*  < > 7.9* 8.0* 7.7*  MG 2.4  --  2.7*  --  2.6*  --   --   --   --   PHOS 3.7  --  3.1  --  3.5  --   --   --   --   < > = values in this interval not displayed.  CBC  Recent Labs Lab 12/07/16 0600 12/08/16 0600 12/09/16 0545  WBC 8.0 7.0 7.0  HGB 12.1* 12.7* 12.4*  HCT 39.7 40.8 39.7  PLT 324 302 288    Coag's No results for input(s): APTT, INR in the last 168 hours.  Sepsis Markers No results for input(s): LATICACIDVEN, PROCALCITON, O2SATVEN in the last 168 hours.  ABG No results for input(s): PHART, PCO2ART, PO2ART in the last 168 hours.  Liver Enzymes No results for input(s): AST, ALT, ALKPHOS, BILITOT, ALBUMIN in the last 168 hours.  Cardiac Enzymes No results for input(s): TROPONINI, PROBNP in the last 168 hours.  Glucose  Recent Labs Lab 12/08/16 1551 12/08/16 1949 12/08/16 2317 12/09/16 0312 12/09/16 0758 12/09/16 1133  GLUCAP 168* 171* 168* 191* 138* 181*    Imaging Dg Chest Port 1 View  Result Date: 12/09/2016 CLINICAL DATA:  Fever.  Intubated patient. EXAM: PORTABLE CHEST 1 VIEW COMPARISON:  Single-view of the chest 12/07/2016 and 12/08/2016. FINDINGS: Support tubes and lines are unchanged and project in good position. There is cardiomegaly and mild interstitial edema. Small bilateral pleural effusions and basilar airspace disease appear increased compared to the prior exam. No pneumothorax. IMPRESSION: Increased small bilateral pleural effusions and basilar airspace disease. Cardiomegaly and interstitial edema. Electronically Signed   By: Drusilla Kanner M.D.   On: 12/09/2016 08:45    STUDIES:  Echo Jan 2017 > LVEF 35-40% with regional wall motion abnormalities,akinesis of the anteroseptal and apical myocardium CT Head 1/4 > large falcine subdural hematoma measuring up to 19 mm thick unchanged, Minimal subarachnoid hemorrhage at sulci along the superior  aspect of the interhemispheric fissure. No intracranial mass, intracerebral hemorrhage or evidence of acute infarction CXR 1/4 > Interstitial and alveolar pulmonary edema pattern without definite pleural effusions EEG 1/5 > Diffuse slowing of background, no seizures seen  EEG 1/6 > generalized polymorphic delta slowing, suggesting severe encephalopathy, no seizures  MRI Head 1/7 > Stable parafalcine and left tentorium subdural hematoma as well as parafalcine subarachnoid hemorrhage Echo 1/20 > 35-40%, reduced systolic function   CULTURES: UA 1/4 > Neg Urine 1/4 > Neg Blood 1/4 > Neg  Flu 1/4 > Neg BC 1/15 > Staph in aerobic bottle, MRSA by reflex panel >>> Urine 1/15 >>> Trach asp 1/15 >>>  ANTIBIOTICS: Rocephin 1/4 >1/4  1/6 > Vancomycin 1/4 >1/7 Zosyn 1/4 > 1/7 Vanocmycin 1/16 >>>  SIGNIFICANT EVENTS: 1/4 > Presents to ED with weakness and headache  1/5 > Intubated   LINES/TUBES: ETT 1/4 >>  R PICC 1/13 > 1/16  ASSESSMENT / PLAN:  PULMONARY A: Acute on Chronic Respiratory Failure  H/O COPD, OSA (on CPAP at HS)  Pulm edema-worse aeration 1/14  P:   Vent Support - Wean as tolerated (failed today due to high RR) Will likely require tracheostomy sometime this week, Dr. Molli Knock will eval  Continue Pulmicort and xopenex.  CARDIOVASCULAR A: Hypotension   CHF  H/O Afib/flutter (was on Xarelto)- now rate controlled. HTN  P: Telemetry Decrease coreg due to borderline low BP and initiation of lasix.  Continue PO diltiazem Restart home xarelto on 1/19 if clinically indicated  Start lasix today 40mg  BID  RENAL A:   AKI worsening (SCr 5 up from 2)  Hypokalemia - improved  Hypernatremia -156>154>145 > 151 > 147 Hypocalcemia  P:  Trend BMP Continue free water Attempt diuresis as above Assess ionized calcium  GASTROINTESTINAL A:   GERD  P:   TF continue PPI renal dose pepcid  HEMATOLOGIC A:  On Chronic Anticoagulation (FFP administered in ED)  P:   SCD Trend CBC  Continue to hold Xarelto as above  INFECTIOUS A:   Bacteremia, likely MRSA  P:   Start Vancomycin DC PICC and give line holiday Start PIV  ENDOCRINE A:   DM, controlled P:   SSI  TF coverage  Keep Lantus 15units BID   NEUROLOGIC A:   Interhemispheric SDH  -recent falls and weakness to right lower extremity  R side weakness P:   PRN sedation only Continue Keppra and Dilantin per neurology  RASS goal: 0  FAMILY  - Updates: Wife updated 1/15 regarding poor progression and likely need for trach.   - Inter-disciplinary family meet or Palliative Care meeting due by: 1/11   Joneen Roach, AGACNP-BC [facility] Pager [phone] or [phone]  12/09/2016 11:49 AM

## 2016-12-09 NOTE — Progress Notes (Signed)
RT placed pt back on full support with same settings due to increased WOB 30s-40s

## 2016-12-10 ENCOUNTER — Inpatient Hospital Stay (HOSPITAL_COMMUNITY): Payer: PPO

## 2016-12-10 DIAGNOSIS — E118 Type 2 diabetes mellitus with unspecified complications: Secondary | ICD-10-CM

## 2016-12-10 DIAGNOSIS — Z794 Long term (current) use of insulin: Secondary | ICD-10-CM

## 2016-12-10 LAB — TRIGLYCERIDES: Triglycerides: 320 mg/dL — ABNORMAL HIGH (ref <150)

## 2016-12-10 LAB — CULTURE, RESPIRATORY

## 2016-12-10 LAB — COMPREHENSIVE METABOLIC PANEL
ALT: 29 U/L (ref 17–63)
ANION GAP: 16 — AB (ref 5–15)
AST: 84 U/L — ABNORMAL HIGH (ref 15–41)
Albumin: 1.2 g/dL — ABNORMAL LOW (ref 3.5–5.0)
Alkaline Phosphatase: 80 U/L (ref 38–126)
BILIRUBIN TOTAL: 0.7 mg/dL (ref 0.3–1.2)
BUN: 237 mg/dL — ABNORMAL HIGH (ref 6–20)
CALCIUM: 7.4 mg/dL — AB (ref 8.9–10.3)
CO2: 15 mmol/L — AB (ref 22–32)
CREATININE: 7.16 mg/dL — AB (ref 0.61–1.24)
Chloride: 116 mmol/L — ABNORMAL HIGH (ref 101–111)
GFR, EST AFRICAN AMERICAN: 9 mL/min — AB (ref >60)
GFR, EST NON AFRICAN AMERICAN: 8 mL/min — AB (ref >60)
Glucose, Bld: 274 mg/dL — ABNORMAL HIGH (ref 65–99)
Potassium: 4.4 mmol/L (ref 3.5–5.1)
SODIUM: 147 mmol/L — AB (ref 135–145)
TOTAL PROTEIN: 5.3 g/dL — AB (ref 6.5–8.1)

## 2016-12-10 LAB — GLUCOSE, CAPILLARY
GLUCOSE-CAPILLARY: 245 mg/dL — AB (ref 65–99)
GLUCOSE-CAPILLARY: 274 mg/dL — AB (ref 65–99)
Glucose-Capillary: 230 mg/dL — ABNORMAL HIGH (ref 65–99)
Glucose-Capillary: 242 mg/dL — ABNORMAL HIGH (ref 65–99)
Glucose-Capillary: 278 mg/dL — ABNORMAL HIGH (ref 65–99)
Glucose-Capillary: 305 mg/dL — ABNORMAL HIGH (ref 65–99)

## 2016-12-10 LAB — CBC
HEMATOCRIT: 36.3 % — AB (ref 39.0–52.0)
HEMOGLOBIN: 11.2 g/dL — AB (ref 13.0–17.0)
MCH: 28.4 pg (ref 26.0–34.0)
MCHC: 30.9 g/dL (ref 30.0–36.0)
MCV: 92.1 fL (ref 78.0–100.0)
Platelets: 269 10*3/uL (ref 150–400)
RBC: 3.94 MIL/uL — ABNORMAL LOW (ref 4.22–5.81)
RDW: 18.4 % — ABNORMAL HIGH (ref 11.5–15.5)
WBC: 6.2 10*3/uL (ref 4.0–10.5)

## 2016-12-10 LAB — CULTURE, RESPIRATORY W GRAM STAIN

## 2016-12-10 LAB — MAGNESIUM: Magnesium: 2.7 mg/dL — ABNORMAL HIGH (ref 1.7–2.4)

## 2016-12-10 MED ORDER — INSULIN GLARGINE 100 UNIT/ML ~~LOC~~ SOLN
30.0000 [IU] | Freq: Two times a day (BID) | SUBCUTANEOUS | Status: DC
  Administered 2016-12-10 – 2016-12-11 (×3): 30 [IU] via SUBCUTANEOUS
  Filled 2016-12-10 (×4): qty 0.3

## 2016-12-10 MED ORDER — INSULIN ASPART 100 UNIT/ML ~~LOC~~ SOLN
0.0000 [IU] | SUBCUTANEOUS | Status: DC
  Administered 2016-12-10 (×2): 11 [IU] via SUBCUTANEOUS
  Administered 2016-12-11 (×2): 4 [IU] via SUBCUTANEOUS
  Administered 2016-12-11 (×2): 11 [IU] via SUBCUTANEOUS
  Administered 2016-12-11 (×2): 7 [IU] via SUBCUTANEOUS

## 2016-12-10 MED ORDER — DEXTROSE 5 % IV SOLN
2.0000 g | INTRAVENOUS | Status: DC
  Administered 2016-12-10 – 2016-12-16 (×7): 2 g via INTRAVENOUS
  Filled 2016-12-10 (×7): qty 2

## 2016-12-10 MED ORDER — SODIUM CHLORIDE 0.9 % IV SOLN
0.0000 ug/min | INTRAVENOUS | Status: DC
  Administered 2016-12-10: 80 ug/min via INTRAVENOUS
  Administered 2016-12-10: 90 ug/min via INTRAVENOUS
  Administered 2016-12-10: 100 ug/min via INTRAVENOUS
  Administered 2016-12-10: 80 ug/min via INTRAVENOUS
  Administered 2016-12-10: 100 ug/min via INTRAVENOUS
  Administered 2016-12-10: 20 ug/min via INTRAVENOUS
  Administered 2016-12-10 – 2016-12-11 (×2): 70 ug/min via INTRAVENOUS
  Administered 2016-12-11: 80 ug/min via INTRAVENOUS
  Administered 2016-12-11: 90 ug/min via INTRAVENOUS
  Administered 2016-12-11: 100 ug/min via INTRAVENOUS
  Administered 2016-12-11: 50 ug/min via INTRAVENOUS
  Administered 2016-12-14: 150 ug/min via INTRAVENOUS
  Administered 2016-12-14: 60 ug/min via INTRAVENOUS
  Administered 2016-12-14: 30 ug/min via INTRAVENOUS
  Filled 2016-12-10 (×19): qty 1

## 2016-12-10 MED ORDER — SODIUM CHLORIDE 0.9 % IV BOLUS (SEPSIS)
500.0000 mL | Freq: Once | INTRAVENOUS | Status: AC
  Administered 2016-12-10: 500 mL via INTRAVENOUS

## 2016-12-10 MED ORDER — FENTANYL CITRATE (PF) 2500 MCG/50ML IJ SOLN
25.0000 ug/h | INTRAMUSCULAR | Status: DC
  Administered 2016-12-10: 25 ug/h via INTRAVENOUS
  Administered 2016-12-12: 225 ug/h via INTRAVENOUS
  Filled 2016-12-10 (×3): qty 50

## 2016-12-10 MED ORDER — SODIUM CHLORIDE 0.9 % IV BOLUS (SEPSIS)
1000.0000 mL | Freq: Once | INTRAVENOUS | Status: AC
  Administered 2016-12-10: 1000 mL via INTRAVENOUS

## 2016-12-10 NOTE — Progress Notes (Signed)
Link Physician-Brief Progress Note Patient Name: Jeremiah Perkins DOB: 09-Nov-1961 MRN: [id number]   Date of Service  12/10/2016  HPI/Events of Note  Multiple issues: 1. Hypotension - BP = 78/59 and 2. Oliguria. No CVL.  eICU Interventions  Will order: 1. Hold scheduled Lasix dose. 2. Bolus with 0.9 NaCl 1 liter IV over 1 hour now.  If not able to hold BP post fluid bolus, will need Phenylephrine IV infusion.      Intervention Category Major Interventions: Hypotension - evaluation and management  Willian Donson Eugene 12/10/2016, 12:27 AM

## 2016-12-10 NOTE — Progress Notes (Signed)
Dr. Arsenio Loader notified of hypotension (SBP 70's) and only 31ml urine output for shift. Order received to hold lasix dose for hypotension and give 1L bolus. Will monitor.

## 2016-12-10 NOTE — Progress Notes (Signed)
MD paged re: increased work of breathing and diminished breath sounds, no UOP, discussed fluid intake of 114mL/hr currently IV and 101mL/hr NGT and nephrology consult, CXR to be performed and sedation to be started

## 2016-12-10 NOTE — Progress Notes (Signed)
eLink Physician-Brief Progress Note Patient Name: Jeremiah Perkins DOB: 1961/01/05 MRN: [id number]   Date of Service  12/10/2016  HPI/Events of Note  Patient with worsening oliguric renal failure. Patient responding transiently to bolus Fentanyl with tachypnea.   eICU Interventions  1. Starting low dose fentanyl gtt 2. Nephrology consult     Intervention Category Major Interventions: Acute renal failure - evaluation and management  Lawanda Cousins 12/10/2016, 6:27 PM

## 2016-12-10 NOTE — Progress Notes (Signed)
PULMONARY / CRITICAL CARE MEDICINE   Name: Jeremiah Perkins MRN: [id number] DOB: 16-Apr-1961    ADMISSION DATE:  12/19/2016 CONSULTATION DATE:  12/05/2016  REFERRING MD:  Dr. Allena Katz   CHIEF COMPLAINT: SDH   Brief:   56 year old male with PMH of DM, COPD, CHF, OSA (on CPAP at HS), atrial flutter (on xarelto), and polysubstance abuse (patient report used cocaine night before admit). Present to ED on 1/3 with new SDH and seizures. Transferred to ICU and ultimately required intubation. Has been slow to improve since that time and remains on ventilator. No further seizures.   SUBJECTIVE:  Failing SBT. Remains on neo gtt at 40 mcg.   VITAL SIGNS: BP (!) 76/40   Pulse (!) 109   Temp 98.8 F (37.1 C) (Axillary)   Resp (!) 25   Ht 5\' 7"  (1.702 m)   Wt 124 kg (273 lb 5.9 oz)   SpO2 98%   BMI 42.82 kg/m   HEMODYNAMICS:    VENTILATOR SETTINGS: Vent Mode: PRVC FiO2 (%):  [30 %] 30 % Set Rate:  [18 bmp] 18 bmp Vt Set:  [530 mL] 530 mL PEEP:  [5 cmH20] 5 cmH20 Pressure Support:  [14 cmH20] 14 cmH20 Plateau Pressure:  [16 cmH20-24 cmH20] 18 cmH20  INTAKE / OUTPUT: I/O last 3 completed shifts: In: 4440 [I.V.:350; NG/GT:1690; IV Piggyback:2400] Out: 460 [Urine:460]  PHYSICAL EXAMINATION:  General:  Obese male, no distress, lying in bed  Neuro:  Opens eyes intermittently but not to command. L arm non purposeful. No movement on R.  HEENT:  ETT in place  Cardiovascular:  Aflutter on monitor, rate controlled. No MRG Lungs:  Diminished bases, non-labored.  Abdomen:  Soft, non-distended, active bowel sounds  Musculoskeletal:  No acute deformity Skin:  Warm, dry, intact   LABS:  BMET  Recent Labs Lab 12/08/16 0600 12/09/16 0545 12/10/16 0320  NA 151* 147* 147*  K 3.8 4.0 4.4  CL 114* 114* 116*  CO2 23 20* 15*  BUN 151* 195* 237*  CREATININE 3.49* 5.00* 7.16*  GLUCOSE 149* 178* 274*   Electrolytes  Recent Labs Lab 12/05/16 0356  12/06/16 0250  12/08/16 0600  12/09/16 0545 12/09/16 1330 12/10/16 0320  CALCIUM 8.6*  < > 8.1*  < > 8.0* 7.7*  --  7.4*  MG 2.7*  --  2.6*  --   --   --   --  2.7*  PHOS 3.1  --  3.5  --   --   --  5.3*  --   < > = values in this interval not displayed.  CBC  Recent Labs Lab 12/08/16 0600 12/09/16 0545 12/10/16 0320  WBC 7.0 7.0 6.2  HGB 12.7* 12.4* 11.2*  HCT 40.8 39.7 36.3*  PLT 302 288 269   Coag's No results for input(s): APTT, INR in the last 168 hours.  Sepsis Markers No results for input(s): LATICACIDVEN, PROCALCITON, O2SATVEN in the last 168 hours.  ABG No results for input(s): PHART, PCO2ART, PO2ART in the last 168 hours.  Liver Enzymes  Recent Labs Lab 12/10/16 0320  AST 84*  ALT 29  ALKPHOS 80  BILITOT 0.7  ALBUMIN 1.2*   Cardiac Enzymes No results for input(s): TROPONINI, PROBNP in the last 168 hours.  Glucose  Recent Labs Lab 12/09/16 1133 12/09/16 1508 12/09/16 1959 12/09/16 2318 12/10/16 0315 12/10/16 0804  GLUCAP 181* 182* 227* 219* 230* 245*   Imaging No results found.  STUDIES:  Echo Jan 2017 > LVEF  35-40% with regional wall motion abnormalities,akinesis of the anteroseptal and apical myocardium CT Head 1/4 > large falcine subdural hematoma measuring up to 19 mm thick unchanged, Minimal subarachnoid hemorrhage at sulci along the superior aspect of the interhemispheric fissure. No intracranial mass, intracerebral hemorrhage or evidence of acute infarction CXR 1/4 > Interstitial and alveolar pulmonary edema pattern without definite pleural effusions EEG 1/5 > Diffuse slowing of background, no seizures seen  EEG 1/6 > generalized polymorphic delta slowing, suggesting severe encephalopathy, no seizures  MRI Head 1/7 > Stable parafalcine and left tentorium subdural hematoma as well as parafalcine subarachnoid hemorrhage Echo 1/20 > 35-40%, reduced systolic function   CULTURES: UA 1/4 > Neg Urine 1/4 > Neg Blood 1/4 > Neg  Flu 1/4 > Neg BC 1/15 > Staph in  aerobic bottle, MRSA by reflex panel >>> Urine 1/15 >>> Trach asp 1/15 >>>  ANTIBIOTICS: Rocephin 1/4 >1/4  1/6 > Vancomycin 1/4 >1/7 Zosyn 1/4 > 1/7 Vanocmycin 1/16 >>>  SIGNIFICANT EVENTS: 1/4 > Presents to ED with weakness and headache  1/5 > Intubated   LINES/TUBES: ETT 1/4 >>  R PICC 1/13 > 1/16  ASSESSMENT / PLAN:  PULMONARY A: Acute on Chronic Respiratory Failure  H/O COPD, OSA (on CPAP at HS)  Pulm edema-worse aeration 1/14  P:   Vent Support - Wean as tolerated (failed today due to high RR) Will need Trach  Continue Pulmicort and xopenex.  CARDIOVASCULAR A: Hypotension   CHF  H/O Afib/flutter (was on Xarelto)- now rate controlled. HTN  P: Telemetry D/C coreg due to hypotension  Continue Neo for MAP >65 Continue PO diltiazem Restart home xarelto on 1/19 if clinically indicated   RENAL A:   AKI worsening (SCr 7 up from 5)  Hypokalemia - improved  Hypernatremia -156>154>145 > 151 > 147 Hypocalcemia  P:  Trend BMP Consult Nephrology  Renal US  Continue free water Ionized calcium - in progress   GASTROINTESTINAL A:   GERD  P:   TF continue PPI renal dose pepcid  HEMATOLOGIC A:  On Chronic Anticoagulation (FFP administered in ED)  P:  SCD Trend CBC  Continue to hold Xarelto as above  INFECTIOUS A:   Bacteremia, likely MRSA -Line Holiday Started 1/17 P:   Continue Vancomycin  ENDOCRINE A:   DM, controlled P:   SSI  TF coverage  Continue Lantus 15 units BID   NEUROLOGIC A:   Interhemispheric SDH  -recent falls and weakness to right lower extremity  R side weakness P:   PRN sedation only Continue Keppra and Dilantin per neurology  RASS goal: 0  FAMILY  - Updates: Wife updated 1/15 regarding poor progression and likely need for trach.   - Inter-disciplinary family meet or Palliative Care meeting on-going.   Jovita Kussmaul, AG-ACNP [facility]  Pgr: [phone]  PCCM Pgr:  [phone]  Attending Note:  56 year old male with extensive PMH who presents with SDH and pulmonary edema resulting in acute respiratory failure requiring intubation.  Patient on exam with clear lungs.  I reviewed CXR myself, ETT ok.  I felt patient's neck, I do not feel safe performing a bedside tracheostomy in this patient.  Will need ENT to perform in the OR as anatomy is very difficult to palpate.  Will continue current medical care.  PS trials as tolerated but no extubation given mental status.  Continue TF for now.  Will call ENT.  The patient is critically ill with multiple organ systems  failure and requires high complexity decision making for assessment and support, frequent evaluation and titration of therapies, application of advanced monitoring technologies and extensive interpretation of multiple databases.   Critical Care Time devoted to patient care services described in this note is  35  Minutes. This time reflects time of care of this signee Dr Callaghan Laverdure. This critical care time does not reflect procedure time, or teaching time or supervisory time of PA/NP/Med student/Med Resident etc but could involve care discussion time.  Ceilidh Torregrossa G. Jaisha Villacres, M.D. [facility]. Pager: [phone]. After hours pager: [phone]. 

## 2016-12-10 NOTE — Progress Notes (Signed)
RT note-ETT found at 20cm, out 4, advanced back too 24cm which it has been, BBS coarse and decreased through out.

## 2016-12-10 NOTE — Progress Notes (Signed)
/  eLink Physician-Brief Progress Note Patient Name: Jeremiah Perkins DOB: January 08, 1961 MRN: [id number]   Date of Service  12/10/2016  HPI/Events of Note  Hypotension - BP = 79/62 with MAP = 68. Last LVEF = 35% to 40%.  eICU Interventions  Will order: 1. Bolus with 0.9 NaCl 500 mL IV over 30 minutes now. 2. Phenylephrine IV infusion. Titrate to SBP > 90. 3. Hold Coreg and Cardizem doses until BP improved.      Intervention Category Major Interventions: Hypotension - evaluation and management  Sommer,Steven Eugene 12/10/2016, 5:24 AM

## 2016-12-10 NOTE — Progress Notes (Signed)
RT note-Called for moderate distress, with RR sustaining this afternoon in the 30's, MD called, sp02 now 89%, patient was bagged and lavaged to insure no plugging, patient is easy to bag with ambu. X-ray ordered and continue to monitor.

## 2016-12-10 NOTE — Progress Notes (Signed)
eLink Physician-Brief Progress Note Patient Name: Jeremiah Perkins DOB: Apr 01, 1961 MRN: [id number]   Date of Service  12/10/2016  HPI/Events of Note  Notified by bedside nurse of persistent and rising hyperglycemia. Currently on moderate sliding scale insulin and Lantus 15 units subcutaneous every 12 hours. Currently receiving tube feedings. No continuous dextrose infusions. Received approximately 37 units of short-acting insulin over last 24 hours.   eICU Interventions  1. Increase Lantus to 30 units subcutaneous every 12 hours 2. Continue Accu-Cheks every 4 hours 3. Increase sliding scale insulin to resistant algorithm      Intervention Category Intermediate Interventions: Hyperglycemia - evaluation and treatment  Lawanda Cousins 12/10/2016, 3:57 PM

## 2016-12-11 ENCOUNTER — Encounter (HOSPITAL_COMMUNITY): Payer: Self-pay | Admitting: Certified Registered"

## 2016-12-11 ENCOUNTER — Inpatient Hospital Stay (HOSPITAL_COMMUNITY): Payer: PPO

## 2016-12-11 ENCOUNTER — Inpatient Hospital Stay (HOSPITAL_COMMUNITY): Payer: PPO | Admitting: Certified Registered Nurse Anesthetist

## 2016-12-11 ENCOUNTER — Other Ambulatory Visit: Payer: Self-pay

## 2016-12-11 ENCOUNTER — Encounter (HOSPITAL_COMMUNITY): Admission: EM | Disposition: E | Payer: Self-pay | Source: Home / Self Care | Attending: Pulmonary Disease

## 2016-12-11 HISTORY — PX: TRACHEOSTOMY TUBE PLACEMENT: SHX814

## 2016-12-11 LAB — GLUCOSE, CAPILLARY
GLUCOSE-CAPILLARY: 117 mg/dL — AB (ref 65–99)
GLUCOSE-CAPILLARY: 179 mg/dL — AB (ref 65–99)
Glucose-Capillary: 233 mg/dL — ABNORMAL HIGH (ref 65–99)
Glucose-Capillary: 275 mg/dL — ABNORMAL HIGH (ref 65–99)
Glucose-Capillary: 252 mg/dL — ABNORMAL HIGH (ref 65–99)
Glucose-Capillary: 192 mg/dL — ABNORMAL HIGH (ref 65–99)

## 2016-12-11 LAB — CALCIUM, IONIZED: Calcium, Ionized, Serum: 4 mg/dL — ABNORMAL LOW (ref 4.5–5.6)

## 2016-12-11 LAB — RENAL FUNCTION PANEL
ALBUMIN: 1.1 g/dL — AB (ref 3.5–5.0)
Anion gap: 14 (ref 5–15)
BUN: 232 mg/dL — AB (ref 6–20)
CALCIUM: 7.6 mg/dL — AB (ref 8.9–10.3)
CO2: 16 mmol/L — ABNORMAL LOW (ref 22–32)
Chloride: 113 mmol/L — ABNORMAL HIGH (ref 101–111)
Creatinine, Ser: 7.28 mg/dL — ABNORMAL HIGH (ref 0.61–1.24)
GFR calc Af Amer: 9 mL/min — ABNORMAL LOW (ref >60)
GFR calc non Af Amer: 7 mL/min — ABNORMAL LOW (ref >60)
GLUCOSE: 228 mg/dL — AB (ref 65–99)
PHOSPHORUS: 4.8 mg/dL — AB (ref 2.5–4.6)
POTASSIUM: 4.7 mmol/L (ref 3.5–5.1)
SODIUM: 143 mmol/L (ref 135–145)

## 2016-12-11 LAB — BILIRUBIN, TOTAL: BILIRUBIN TOTAL: 0.6 mg/dL (ref 0.3–1.2)

## 2016-12-11 LAB — ALT: ALT: 32 U/L (ref 17–63)

## 2016-12-11 LAB — BASIC METABOLIC PANEL
ANION GAP: 17 — AB (ref 5–15)
BUN: 266 mg/dL — ABNORMAL HIGH (ref 6–20)
CHLORIDE: 113 mmol/L — AB (ref 101–111)
CO2: 15 mmol/L — ABNORMAL LOW (ref 22–32)
Calcium: 7.5 mg/dL — ABNORMAL LOW (ref 8.9–10.3)
Creatinine, Ser: 7.66 mg/dL — ABNORMAL HIGH (ref 0.61–1.24)
GFR, EST AFRICAN AMERICAN: 8 mL/min — AB (ref >60)
GFR, EST NON AFRICAN AMERICAN: 7 mL/min — AB (ref >60)
Glucose, Bld: 278 mg/dL — ABNORMAL HIGH (ref 65–99)
POTASSIUM: 5.5 mmol/L — AB (ref 3.5–5.1)
SODIUM: 145 mmol/L (ref 135–145)

## 2016-12-11 LAB — CBC
HEMATOCRIT: 39.9 % (ref 39.0–52.0)
Hemoglobin: 12.4 g/dL — ABNORMAL LOW (ref 13.0–17.0)
MCH: 28.2 pg (ref 26.0–34.0)
MCHC: 31.1 g/dL (ref 30.0–36.0)
MCV: 90.9 fL (ref 78.0–100.0)
Platelets: 322 10*3/uL (ref 150–400)
RBC: 4.39 MIL/uL (ref 4.22–5.81)
RDW: 18.7 % — ABNORMAL HIGH (ref 11.5–15.5)
WBC: 6.9 10*3/uL (ref 4.0–10.5)

## 2016-12-11 LAB — GAMMA GT: GGT: 281 U/L — ABNORMAL HIGH (ref 7–50)

## 2016-12-11 LAB — VANCOMYCIN, RANDOM: VANCOMYCIN RM: 20

## 2016-12-11 LAB — MAGNESIUM: Magnesium: 2.9 mg/dL — ABNORMAL HIGH (ref 1.7–2.4)

## 2016-12-11 LAB — PHOSPHORUS: PHOSPHORUS: 5.9 mg/dL — AB (ref 2.5–4.6)

## 2016-12-11 LAB — AST: AST: 69 U/L — ABNORMAL HIGH (ref 15–41)

## 2016-12-11 SURGERY — CREATION, TRACHEOSTOMY
Anesthesia: General | Site: Neck

## 2016-12-11 MED ORDER — LIDOCAINE-EPINEPHRINE (PF) 1 %-1:200000 IJ SOLN
INTRAMUSCULAR | Status: AC
  Filled 2016-12-11: qty 30

## 2016-12-11 MED ORDER — PROPOFOL 10 MG/ML IV BOLUS
INTRAVENOUS | Status: AC
  Filled 2016-12-11: qty 20

## 2016-12-11 MED ORDER — MIDAZOLAM HCL 2 MG/2ML IJ SOLN
INTRAMUSCULAR | Status: AC
  Filled 2016-12-11: qty 2

## 2016-12-11 MED ORDER — PRISMASOL BGK 4/2.5 32-4-2.5 MEQ/L IV SOLN
INTRAVENOUS | Status: DC
  Administered 2016-12-11 – 2016-12-16 (×13): via INTRAVENOUS_CENTRAL
  Filled 2016-12-11 (×12): qty 5000

## 2016-12-11 MED ORDER — HEPARIN BOLUS VIA INFUSION (CRRT)
1000.0000 [IU] | INTRAVENOUS | Status: DC | PRN
  Administered 2016-12-12: 1000 [IU] via INTRAVENOUS_CENTRAL
  Filled 2016-12-11: qty 1000

## 2016-12-11 MED ORDER — LIDOCAINE-EPINEPHRINE (PF) 1 %-1:200000 IJ SOLN
INTRAMUSCULAR | Status: DC | PRN
  Administered 2016-12-11: 5 mL

## 2016-12-11 MED ORDER — 0.9 % SODIUM CHLORIDE (POUR BTL) OPTIME
TOPICAL | Status: DC | PRN
  Administered 2016-12-11: 1000 mL

## 2016-12-11 MED ORDER — HEPARIN BOLUS VIA INFUSION (CRRT): 1000.0000 [IU] | INTRAVENOUS | Status: DC | PRN

## 2016-12-11 MED ORDER — FENTANYL CITRATE (PF) 100 MCG/2ML IJ SOLN
INTRAMUSCULAR | Status: DC | PRN
  Administered 2016-12-11: 100 ug via INTRAVENOUS

## 2016-12-11 MED ORDER — PRISMASOL BGK 4/2.5 32-4-2.5 MEQ/L IV SOLN
INTRAVENOUS | Status: DC
  Administered 2016-12-11 – 2016-12-16 (×41): via INTRAVENOUS_CENTRAL
  Filled 2016-12-11 (×57): qty 5000

## 2016-12-11 MED ORDER — HEPARIN SODIUM (PORCINE) 1000 UNIT/ML DIALYSIS: 1000.0000 [IU] | INTRAMUSCULAR | Status: DC | PRN

## 2016-12-11 MED ORDER — MIDAZOLAM HCL 5 MG/5ML IJ SOLN
INTRAMUSCULAR | Status: DC | PRN
  Administered 2016-12-11: 2 mg via INTRAVENOUS

## 2016-12-11 MED ORDER — PRISMASOL BGK 4/2.5 32-4-2.5 MEQ/L IV SOLN
INTRAVENOUS | Status: DC
  Administered 2016-12-11 – 2016-12-16 (×10): via INTRAVENOUS_CENTRAL
  Filled 2016-12-11 (×16): qty 5000

## 2016-12-11 MED ORDER — LIDOCAINE HCL (PF) 1 % IJ SOLN
INTRAMUSCULAR | Status: AC
  Filled 2016-12-11: qty 30

## 2016-12-11 MED ORDER — INSULIN ASPART 100 UNIT/ML ~~LOC~~ SOLN
4.0000 [IU] | SUBCUTANEOUS | Status: DC
  Administered 2016-12-11 (×4): 4 [IU] via SUBCUTANEOUS

## 2016-12-11 MED ORDER — PHENYLEPHRINE 40 MCG/ML (10ML) SYRINGE FOR IV PUSH (FOR BLOOD PRESSURE SUPPORT)
PREFILLED_SYRINGE | INTRAVENOUS | Status: DC | PRN
  Administered 2016-12-11: 80 ug via INTRAVENOUS

## 2016-12-11 MED ORDER — FENTANYL CITRATE (PF) 100 MCG/2ML IJ SOLN
INTRAMUSCULAR | Status: AC
  Filled 2016-12-11: qty 2

## 2016-12-11 MED ORDER — LACTATED RINGERS IV SOLN
INTRAVENOUS | Status: DC | PRN
  Administered 2016-12-11: 18:00:00 via INTRAVENOUS

## 2016-12-11 MED ORDER — HEPARIN SODIUM (PORCINE) 1000 UNIT/ML DIALYSIS
1000.0000 [IU] | INTRAMUSCULAR | Status: DC | PRN
  Administered 2016-12-11: 3000 [IU] via INTRAVENOUS_CENTRAL
  Filled 2016-12-11: qty 3
  Filled 2016-12-11 (×2): qty 6

## 2016-12-11 MED ORDER — SODIUM CHLORIDE 0.9 % FOR CRRT
INTRAVENOUS_CENTRAL | Status: DC | PRN
  Administered 2016-12-11: 14:00:00 via INTRAVENOUS_CENTRAL

## 2016-12-11 MED ORDER — VANCOMYCIN HCL IN DEXTROSE 1-5 GM/200ML-% IV SOLN
1000.0000 mg | Freq: Once | INTRAVENOUS | Status: AC
  Administered 2016-12-11: 1000 mg via INTRAVENOUS
  Filled 2016-12-11: qty 200

## 2016-12-11 MED ORDER — ROCURONIUM BROMIDE 10 MG/ML (PF) SYRINGE
PREFILLED_SYRINGE | INTRAVENOUS | Status: DC | PRN
  Administered 2016-12-11: 50 mg via INTRAVENOUS

## 2016-12-11 MED ORDER — SODIUM CHLORIDE 0.9 % IJ SOLN
250.0000 [IU]/h | INTRAMUSCULAR | Status: DC
  Administered 2016-12-12 (×2): 1550 [IU]/h via INTRAVENOUS_CENTRAL
  Administered 2016-12-12: 1300 [IU]/h via INTRAVENOUS_CENTRAL
  Administered 2016-12-12: 350 [IU]/h via INTRAVENOUS_CENTRAL
  Administered 2016-12-13: 1950 [IU]/h via INTRAVENOUS_CENTRAL
  Administered 2016-12-13: 1800 [IU]/h via INTRAVENOUS_CENTRAL
  Administered 2016-12-13 – 2016-12-14 (×3): 1950 [IU]/h via INTRAVENOUS_CENTRAL
  Administered 2016-12-14 (×2): 2000 [IU]/h via INTRAVENOUS_CENTRAL
  Administered 2016-12-14: 1950 [IU]/h via INTRAVENOUS_CENTRAL
  Administered 2016-12-15 (×2): 2000 [IU]/h via INTRAVENOUS_CENTRAL
  Administered 2016-12-15: 1950 [IU]/h via INTRAVENOUS_CENTRAL
  Administered 2016-12-15 (×2): 2000 [IU]/h via INTRAVENOUS_CENTRAL
  Administered 2016-12-15 – 2016-12-16 (×5): 1850 [IU]/h via INTRAVENOUS_CENTRAL
  Filled 2016-12-11 (×24): qty 2

## 2016-12-11 SURGICAL SUPPLY — 41 items
BLADE SURG 15 STRL LF DISP TIS (BLADE) IMPLANT
BLADE SURG 15 STRL SS (BLADE)
BLADE SURG ROTATE 9660 (MISCELLANEOUS) IMPLANT
CANISTER SUCTION 2500CC (MISCELLANEOUS) ×3 IMPLANT
CLEANER TIP ELECTROSURG 2X2 (MISCELLANEOUS) ×3 IMPLANT
COVER SURGICAL LIGHT HANDLE (MISCELLANEOUS) ×3 IMPLANT
CRADLE DONUT ADULT HEAD (MISCELLANEOUS) IMPLANT
DECANTER SPIKE VIAL GLASS SM (MISCELLANEOUS) ×3 IMPLANT
DRAPE PROXIMA HALF (DRAPES) IMPLANT
ELECT COATED BLADE 2.86 ST (ELECTRODE) ×3 IMPLANT
ELECT REM PT RETURN 9FT ADLT (ELECTROSURGICAL) ×3
ELECTRODE REM PT RTRN 9FT ADLT (ELECTROSURGICAL) ×1 IMPLANT
GAUZE SPONGE 4X4 16PLY XRAY LF (GAUZE/BANDAGES/DRESSINGS) ×3 IMPLANT
GLOVE BIO SURGEON STRL SZ7.5 (GLOVE) ×3 IMPLANT
GOWN STRL REUS W/ TWL LRG LVL3 (GOWN DISPOSABLE) ×2 IMPLANT
GOWN STRL REUS W/TWL LRG LVL3 (GOWN DISPOSABLE) ×6
HOLDER TRACH TUBE VELCRO 19.5 (MISCELLANEOUS) ×3 IMPLANT
KIT BASIN OR (CUSTOM PROCEDURE TRAY) ×3 IMPLANT
KIT ROOM TURNOVER OR (KITS) ×3 IMPLANT
KIT SUCTION CATH 14FR (SUCTIONS) IMPLANT
NDL HYPO 25GX1X1/2 BEV (NEEDLE) IMPLANT
NEEDLE HYPO 25GX1X1/2 BEV (NEEDLE) IMPLANT
NS IRRIG 1000ML POUR BTL (IV SOLUTION) ×3 IMPLANT
PACK EENT II TURBAN DRAPE (CUSTOM PROCEDURE TRAY) ×3 IMPLANT
PAD ARMBOARD 7.5X6 YLW CONV (MISCELLANEOUS) ×6 IMPLANT
PENCIL BUTTON HOLSTER BLD 10FT (ELECTRODE) ×3 IMPLANT
SPONGE DRAIN TRACH 4X4 STRL 2S (GAUZE/BANDAGES/DRESSINGS) ×3 IMPLANT
SPONGE INTESTINAL PEANUT (DISPOSABLE) IMPLANT
SUT MON AB 3-0 SH 27 (SUTURE) ×3
SUT MON AB 3-0 SH27 (SUTURE) ×1 IMPLANT
SUT SILK 0 FSL (SUTURE) ×3 IMPLANT
SUT SILK 2 0 REEL (SUTURE) ×3 IMPLANT
SUT SILK 2 0 SH CR/8 (SUTURE) ×3 IMPLANT
SUT VIC AB 2-0 FS1 27 (SUTURE) IMPLANT
SYR 20ML ECCENTRIC (SYRINGE) ×3 IMPLANT
SYR BULB 3OZ (MISCELLANEOUS) ×3 IMPLANT
SYR CONTROL 10ML LL (SYRINGE) IMPLANT
TOWEL OR 17X24 6PK STRL BLUE (TOWEL DISPOSABLE) ×3 IMPLANT
TUBE CONNECTING 12'X1/4 (SUCTIONS) ×1
TUBE CONNECTING 12X1/4 (SUCTIONS) ×2 IMPLANT
WATER STERILE IRR 1000ML POUR (IV SOLUTION) ×3 IMPLANT

## 2016-12-11 NOTE — Progress Notes (Signed)
Pt came back from OR w/ trach. Extra trach supplies being ordered.  BBSH coarse t/o, sat 98%.  Obturator at [facility].

## 2016-12-11 NOTE — Consult Note (Signed)
[facility] Consult Note     Date: 12/20/2016                  Patient Name:  Jeremiah Perkins  MRN: [id number]  DOB: 1961/04/17  Age / Sex: 56 y.o., male         PCP: [name], DO                 Service Requesting Consult: [place]                 Reason for Consult: acute oliguric kidney injury             Chief Complaint: weakness  HPI: Jeremiah Perkins is a 56 y.o. male with medical history significant of CHF, Afib on Xarelto anticoagulation therapy,  DM type II, CKD, HTN who was recently hospitalized (10/26/11/9) for sepsis associated with UTI presented to the ED with c/o multiple falls during the two weeks prior to admission.    He reported doing cocaine prior to admission.  He became hypotensive and febrile shortly afterwards with possible septic shock.  He was also found to have a subdural and a seizure and was placed on Dilatin and Keppra.    During this hospitalization, he has had a prolonged wean from the vent, plan for trach today.    He has become increasingly hypotensive requiring higher doses of pressor over the past few days.  He has developed recurrent fevers and broad spectrum abx have been re-initiated.  He has become oliguric and his creatinine has risen to baseline of 1.1--> 7.7.  He has also developed a blanching erythematous rash over extremities and flanks.   Past Medical History:  Diagnosis Date  . Anxiety   . Arthritis    involving knees, ankles, back, elbows (01/02/2015)  . Asthma   . Atrial flutter ([place])   . Cholelithiasis   . Chronic back pain   . Chronic bronchitis ([place])    "get it q yr" (01/02/2015)  . Congestive heart failure ([place])    No echo data available. Myocardial perfusion 2005 with EF 61%, presumed diastolic HF   . COPD (chronic obstructive pulmonary disease) ([place])   . Diabetic peripheral neuropathy ([place])   . Diverticulosis    with history of diverticulitis in 10/2011  . GERD (gastroesophageal reflux disease)    . Gout   . Hyperlipidemia   . Hypertension   . Major depression   . Morbid obesity ([place])   . Myocardial infarction    "one doctor said I did back in 2014"  . OSA (obstructive sleep apnea) 1998   "wear my CPAP a couple times/month" (01/02/2015)  . Pneumonia 2013; 2014  . Psychosexual dysfunction with inhibited sexual excitement   . Shortness of breath    "all the time" (07/26/2013)  . Type II diabetes mellitus ([place]) dx'd 1991   previously on insulin in 2000 for 3-4 years (but was stopped because adamantly did not want to be on insulin)    Past Surgical History:  Procedure Laterality Date  . 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 ANKLE RECONSTRUCTION Right 1990's   2/2 trauma sustained after fall  . CARDIAC CATHETERIZATION  1990's; 07/2013  . CARDIOVERSION N/A 07/28/2013   Procedure: CARDIOVERSION;  Surgeon: Larey Dresser, MD;  Location: [facility] ENDOSCOPY;  Service: Cardiovascular;  Laterality: N/A;  . CARDIOVERSION N/A 10/26/2013   Procedure: CARDIOVERSION;  Surgeon: [name], MD;  Location: [facility];  Service: Cardiovascular;  Laterality: N/A;  . CHOLECYSTECTOMY N/A 08/24/2015   Procedure: LAPAROSCOPIC CHOLECYSTECTOMY WITH INTRAOPERATIVE CHOLANGIOGRAM;  Surgeon: Jackolyn Confer, MD;  Location: [facility] ORS;  Service: General;  Laterality: N/A;  . CYST REMOVAL LEG Left 1960's   back of  leg  . FINGER AMPUTATION    . LEFT HEART CATHETERIZATION WITH CORONARY ANGIOGRAM N/A 07/27/2013   Procedure: LEFT HEART CATHETERIZATION WITH CORONARY ANGIOGRAM;  Surgeon: Peter M Martinique, MD;  Location: [facility] CATH LAB;  Service: Cardiovascular;  Laterality: N/A;  . TEE WITHOUT CARDIOVERSION N/A 07/28/2013   Procedure: TRANSESOPHAGEAL ECHOCARDIOGRAM (TEE);  Surgeon: Larey Dresser, MD;  Location: [facility] ENDOSCOPY;  Service: Cardiovascular;  Laterality: N/A;    Family History  Problem Relation Age of Onset  . Diabetes Mother   . CAD Mother 35    requiring quadruple bypass  . Hypertension Mother   .  Alzheimer's disease Mother   . Stomach cancer Father   . Lung cancer Father     was a smoker  . Gout Father   . CAD Brother 73    requiring quadruple bypass  . Seizures Brother   . Hypertension Brother   . Diabetes Maternal Grandmother   . Alzheimer's disease Maternal Grandmother   . Breast cancer Paternal Aunt   . Diabetes Maternal Aunt   . Seizures Paternal Uncle    Social History:  reports that he quit smoking about 14 months ago. His smoking use included Cigarettes. He has a 1.20 pack-year smoking history. He has never used smokeless tobacco. He reports that he drinks alcohol. He reports that he uses drugs, including Marijuana and Cocaine.  Allergies: No Known Allergies  Medications Prior to Admission  Medication Sig Dispense Refill  . allopurinol (ZYLOPRIM) 300 MG tablet Take 300 mg by mouth daily.    . Blood Glucose Monitoring Suppl (ACCU-CHEK AVIVA PLUS) w/Device KIT Use glucose meter to check blood sugars 4 times daily. 1 kit 0  . budesonide (PULMICORT) 0.5 MG/2ML nebulizer solution Take 4 mLs (1 mg total) by nebulization 2 (two) times daily. 60 mL 12  . buPROPion (WELLBUTRIN SR) 150 MG 12 hr tablet Take 1 tablet (150 mg total) by mouth 2 (two) times daily. 60 tablet 1  . carvedilol (COREG) 12.5 MG tablet Take 1 tablet (12.5 mg total) by mouth 2 (two) times daily with a meal. 180 tablet 3  . colchicine 0.6 MG tablet Take 1 tablet (0.6 mg total) by mouth daily. 30 tablet 0  . diclofenac sodium (VOLTAREN) 1 % GEL Apply 2 g topically 4 (four) times daily. 100 g 4  . fluticasone (FLONASE) 50 MCG/ACT nasal spray Use one spray in each nostril daily for seasonal allergy 16 g 3  . furosemide (LASIX) 80 MG tablet Take 1 tablet (80 mg total) by mouth daily. 30 tablet 1  . gabapentin (NEURONTIN) 600 MG tablet TAKE ONE TABLET BY MOUTH EVERY MORNING AND TAKE TWO TABLETS BY MOUTH EVERY EVENING 90 tablet 1  . glucose blood (ACCU-CHEK AVIVA PLUS) test strip Use to check blood sugar four times  daily. Dx: E11.8 100 each 11  . insulin glargine (LANTUS) 100 unit/mL SOPN Inject 0.2 mLs (20 Units total) into the skin at bedtime. 6 mL 0  . Insulin Pen Needle 31G X 8 MM MISC Use daily with the administration of lantus E11.8 100 each 1  . ipratropium (ATROVENT) 0.02 % nebulizer solution Take 2.5 mLs (0.5 mg total) by nebulization 2 (two) times daily. 75 mL 12  .  levalbuterol (XOPENEX HFA) 45 MCG/ACT inhaler Inhale 1-2 puffs into the lungs every 4 (four) hours as needed for wheezing. Reported on 12/13/2015 1 Inhaler 2  . levalbuterol (XOPENEX) 0.63 MG/3ML nebulizer solution Take 3 mLs (0.63 mg total) by nebulization every 4 (four) hours as needed for wheezing. (Patient taking differently: Take 0.63 mg by nebulization 2 (two) times daily as needed for wheezing or shortness of breath. ) 60 mL 12  . losartan (COZAAR) 25 MG tablet Take 0.5 tablets (12.5 mg total) by mouth daily. Reported on 12/13/2015 45 tablet 2  . metolazone (ZAROXOLYN) 2.5 MG tablet Take 1 tablet (2.5 mg total) by mouth See admin instructions. Take 1 tablet (2.5 mg) by mouth up to twice a week as needed for weight gain of 7 lbs in 24 hours 15 tablet 0  . oxyCODONE (OXY IR/ROXICODONE) 5 MG immediate release tablet Take 1-2 tablets (5-10 mg total) by mouth every 4 (four) hours as needed for moderate pain, severe pain or breakthrough pain. 100 tablet 0  . potassium chloride SA (K-DUR,KLOR-CON) 20 MEQ tablet Take 1 tablet (20 mEq total) by mouth daily. (Patient taking differently: Take 20 mEq by mouth 2 (two) times daily. ) 30 tablet 0  . PRESCRIPTION MEDICATION Inhale into the lungs See admin instructions. Use CPAP whenever sleeping    . sildenafil (VIAGRA) 100 MG tablet Take 1 tablet (100 mg total) by mouth daily as needed for erectile dysfunction. Reported on 12/13/2015 10 tablet 0  . XARELTO 20 MG TABS tablet TAKE 1 TABLET (20 MG TOTAL) BY MOUTH DAILY WITH SUPPER. 30 tablet 11    Results for orders placed or performed during the  hospital encounter of 12/10/2016 (from the past 48 hour(s))  Phosphorus     Status: Abnormal   Collection Time: 12/09/16  1:30 PM  Result Value Ref Range   Phosphorus 5.3 (H) 2.5 - 4.6 mg/dL  Glucose, capillary     Status: Abnormal   Collection Time: 12/09/16  3:08 PM  Result Value Ref Range   Glucose-Capillary 182 (H) 65 - 99 mg/dL   Comment 1 Notify RN    Comment 2 Document in Chart   Glucose, capillary     Status: Abnormal   Collection Time: 12/09/16  7:59 PM  Result Value Ref Range   Glucose-Capillary 227 (H) 65 - 99 mg/dL  Glucose, capillary     Status: Abnormal   Collection Time: 12/09/16 11:18 PM  Result Value Ref Range   Glucose-Capillary 219 (H) 65 - 99 mg/dL  Triglycerides     Status: Abnormal   Collection Time: 12/10/16 12:19 AM  Result Value Ref Range   Triglycerides 320 (H) <150 mg/dL  Glucose, capillary     Status: Abnormal   Collection Time: 12/10/16  3:15 AM  Result Value Ref Range   Glucose-Capillary 230 (H) 65 - 99 mg/dL  CBC     Status: Abnormal   Collection Time: 12/10/16  3:20 AM  Result Value Ref Range   WBC 6.2 4.0 - 10.5 K/uL   RBC 3.94 (L) 4.22 - 5.81 MIL/uL   Hemoglobin 11.2 (L) 13.0 - 17.0 g/dL   HCT 36.3 (L) 39.0 - 52.0 %   MCV 92.1 78.0 - 100.0 fL   MCH 28.4 26.0 - 34.0 pg   MCHC 30.9 30.0 - 36.0 g/dL   RDW 18.4 (H) 11.5 - 15.5 %   Platelets 269 150 - 400 K/uL  Magnesium     Status: Abnormal   Collection Time: 12/10/16  3:20 AM  Result Value Ref Range   Magnesium 2.7 (H) 1.7 - 2.4 mg/dL  Comprehensive metabolic panel     Status: Abnormal   Collection Time: 12/10/16  3:20 AM  Result Value Ref Range   Sodium 147 (H) 135 - 145 mmol/L   Potassium 4.4 3.5 - 5.1 mmol/L   Chloride 116 (H) 101 - 111 mmol/L   CO2 15 (L) 22 - 32 mmol/L   Glucose, Bld 274 (H) 65 - 99 mg/dL   BUN 237 (H) 6 - 20 mg/dL   Creatinine, Ser 7.16 (H) 0.61 - 1.24 mg/dL   Calcium 7.4 (L) 8.9 - 10.3 mg/dL   Total Protein 5.3 (L) 6.5 - 8.1 g/dL   Albumin 1.2 (L) 3.5 - 5.0  g/dL   AST 84 (H) 15 - 41 U/L   ALT 29 17 - 63 U/L   Alkaline Phosphatase 80 38 - 126 U/L   Total Bilirubin 0.7 0.3 - 1.2 mg/dL   GFR calc non Af Amer 8 (L) >60 mL/min   GFR calc Af Amer 9 (L) >60 mL/min    Comment: (NOTE) The eGFR has been calculated using the CKD EPI equation. This calculation has not been validated in all clinical situations. eGFR's persistently <60 mL/min signify possible Chronic Kidney Disease.    Anion gap 16 (H) 5 - 15  Glucose, capillary     Status: Abnormal   Collection Time: 12/10/16  8:04 AM  Result Value Ref Range   Glucose-Capillary 245 (H) 65 - 99 mg/dL  Glucose, capillary     Status: Abnormal   Collection Time: 12/10/16 11:24 AM  Result Value Ref Range   Glucose-Capillary 305 (H) 65 - 99 mg/dL  Glucose, capillary     Status: Abnormal   Collection Time: 12/10/16  3:58 PM  Result Value Ref Range   Glucose-Capillary 274 (H) 65 - 99 mg/dL  Glucose, capillary     Status: Abnormal   Collection Time: 12/10/16  7:28 PM  Result Value Ref Range   Glucose-Capillary 278 (H) 65 - 99 mg/dL  Glucose, capillary     Status: Abnormal   Collection Time: 12/10/16 11:37 PM  Result Value Ref Range   Glucose-Capillary 242 (H) 65 - 99 mg/dL  Basic metabolic panel     Status: Abnormal   Collection Time: 12/24/2016  3:18 AM  Result Value Ref Range   Sodium 145 135 - 145 mmol/L   Potassium 5.5 (H) 3.5 - 5.1 mmol/L    Comment: DELTA CHECK NOTED   Chloride 113 (H) 101 - 111 mmol/L   CO2 15 (L) 22 - 32 mmol/L   Glucose, Bld 278 (H) 65 - 99 mg/dL   BUN 266 (H) 6 - 20 mg/dL   Creatinine, Ser 7.66 (H) 0.61 - 1.24 mg/dL   Calcium 7.5 (L) 8.9 - 10.3 mg/dL   GFR calc non Af Amer 7 (L) >60 mL/min   GFR calc Af Amer 8 (L) >60 mL/min    Comment: (NOTE) The eGFR has been calculated using the CKD EPI equation. This calculation has not been validated in all clinical situations. eGFR's persistently <60 mL/min signify possible Chronic Kidney Disease.    Anion gap 17 (H) 5 - 15   CBC     Status: Abnormal   Collection Time: 11/28/2016  3:18 AM  Result Value Ref Range   WBC 6.9 4.0 - 10.5 K/uL   RBC 4.39 4.22 - 5.81 MIL/uL   Hemoglobin 12.4 (L) 13.0 - 17.0 g/dL  HCT 39.9 39.0 - 52.0 %   MCV 90.9 78.0 - 100.0 fL   MCH 28.2 26.0 - 34.0 pg   MCHC 31.1 30.0 - 36.0 g/dL   RDW 18.7 (H) 11.5 - 15.5 %   Platelets 322 150 - 400 K/uL  Magnesium     Status: Abnormal   Collection Time: 12/20/2016  3:18 AM  Result Value Ref Range   Magnesium 2.9 (H) 1.7 - 2.4 mg/dL  Phosphorus     Status: Abnormal   Collection Time: 12/15/2016  3:18 AM  Result Value Ref Range   Phosphorus 5.9 (H) 2.5 - 4.6 mg/dL  Glucose, capillary     Status: Abnormal   Collection Time: 12/14/2016  3:38 AM  Result Value Ref Range   Glucose-Capillary 233 (H) 65 - 99 mg/dL  Vancomycin, random     Status: None   Collection Time: 12/02/2016  7:50 AM  Result Value Ref Range   Vancomycin Rm 20     Comment:        Random Vancomycin therapeutic range is dependent on dosage and time of specimen collection. A peak range is 20.0-40.0 ug/mL A trough range is 5.0-15.0 ug/mL          Glucose, capillary     Status: Abnormal   Collection Time: 12/10/2016  7:53 AM  Result Value Ref Range   Glucose-Capillary 275 (H) 65 - 99 mg/dL   Comment 1 Notify RN    Comment 2 Document in Chart   AST     Status: Abnormal   Collection Time: 11/28/2016 10:11 AM  Result Value Ref Range   AST 69 (H) 15 - 41 U/L  ALT     Status: None   Collection Time: 12/07/2016 10:11 AM  Result Value Ref Range   ALT 32 17 - 63 U/L  Bilirubin, total     Status: None   Collection Time: 12/20/2016 10:11 AM  Result Value Ref Range   Total Bilirubin 0.6 0.3 - 1.2 mg/dL  Gamma GT     Status: Abnormal   Collection Time: 12/13/2016 10:11 AM  Result Value Ref Range   GGT 281 (H) 7 - 50 U/L  Glucose, capillary     Status: Abnormal   Collection Time: 12/24/2016 11:44 AM  Result Value Ref Range   Glucose-Capillary 252 (H) 65 - 99 mg/dL   US Renal  Result  Date: 12/10/2016 CLINICAL DATA:  Acute renal failure, patient ventilated. EXAM: RENAL / URINARY TRACT ULTRASOUND COMPLETE COMPARISON:  CT 10/26/2016, ultrasound 10/26/2016 FINDINGS: Right Kidney: Length: 12.9 cm. Echogenicity of the kidney is increased. No mass or hydronephrosis visualized. No obstructive uropathy. Left Kidney: Length: 13.4 cm. Echogenicity of the kidney is limits. No mass or hydronephrosis visualized. No obstructive uropathy. Bladder: Foley catheter decompressed bladder. Suboptimal assessment as a result. IMPRESSION: Increased echogenicity of both kidneys suspicious for medical renal disease. No obstructive uropathy. Bladder is decompressed by Foley catheter. Electronically Signed   By: Ashley Royalty M.D.   On: 12/10/2016 14:54   Dg Chest Portable 1 View  Result Date: 12/24/2016 CLINICAL DATA:  Right-sided central line placement EXAM: PORTABLE CHEST 1 VIEW COMPARISON:  12/09/2016 chest radiograph. FINDINGS: Endotracheal tube tip is 3.5 cm above the carina. Enteric tube enters stomach with the tip not seen on this image. Right internal jugular central venous catheter terminates in the lower third of the superior vena cava. Stable cardiomediastinal silhouette with mild cardiomegaly and aortic atherosclerosis. No pneumothorax. Stable small bilateral pleural effusions. Mild to  moderate pulmonary edema appears slightly worsened. IMPRESSION: 1. No pneumothorax. Right internal jugular central venous catheter terminates in the lower third of the superior vena cava . Additional support structures as described. 2. Mild-to-moderate congestive heart failure, slightly worsened . 3. Stable small bilateral pleural effusions. Electronically Signed   By: Ilona Sorrel M.D.   On: 11/24/2016 11:48    ROS: unable to obtain given mental status  Blood pressure 116/82, pulse (!) 122, temperature 97.8 F (36.6 C), temperature source Axillary, resp. rate (!) 26, height _0  (1.702 m), weight 126.9 kg (279 lb 12.2  oz), SpO2 100 %. Physical Exam  GEN: intubated, sedated, doesn't follow commands HEENT: eyes closed, sclerae anicteric NECK: + JVD PULM: coarse bilaterally CV tachycardic, irregular ABD obese EXT 1+ upper and lower pitting edema NEURO not responsive to commands SKIN: blanching erythematous rash over arms and flanks- no skin sloughing  Assessment/Plan  1.  Acute oliguric kidney injury: most likely ATN/ hemodynamically mediated.  We will initate CRRT for volume management and electrolyte optimization.  No heparin in setting of subdural.  2.  Erythematous rash: ? Drug eruption from dilatin.  Await levels.  Dilatin moderately dialyzable  3.  Recurrent shock: most likely septic, but consider DRESS as well.  4.  Subural hematoma: followed by NSG.  Awaiting neurologic recovery.    5.  Ventilator dependent respiratory failure: plan for trach today   Madelon Lips, MD 11/24/2016, 12:19 PM

## 2016-12-11 NOTE — Progress Notes (Signed)
PULMONARY / CRITICAL CARE MEDICINE   Name: Jeremiah Perkins MRN: [id number] DOB: 09-Mar-1961    ADMISSION DATE:  12/05/2016 CONSULTATION DATE:  12/17/2016  REFERRING MD:  Dr. Allena Katz   CHIEF COMPLAINT: SDH   Brief:   56 year old male with PMH of DM, COPD, CHF, OSA (on CPAP at HS), atrial flutter (on xarelto), and polysubstance abuse (patient report used cocaine night before admit). Present to ED on 1/3 with new SDH and seizures. Transferred to ICU and ultimately required intubation. Has been slow to improve since that time and remains on ventilator. No further seizures.   SUBJECTIVE:  Failing SBT. Remains on neo gtt at 70 mcg. On low dose fentanyl gtt   VITAL SIGNS: BP 117/67   Pulse 100   Temp 97.8 F (36.6 C) (Axillary)   Resp (!) 26   Ht 5\' 7"  (1.702 m)   Wt 126.9 kg (279 lb 12.2 oz)   SpO2 99%   BMI 43.82 kg/m   HEMODYNAMICS:    VENTILATOR SETTINGS: Vent Mode: PRVC FiO2 (%):  [30 %-40 %] 40 % Set Rate:  [18 bmp] 18 bmp Vt Set:  [530 mL] 530 mL PEEP:  [5 cmH20] 5 cmH20 Plateau Pressure:  [16 cmH20-19 cmH20] 16 cmH20  INTAKE / OUTPUT: I/O last 3 completed shifts: In: 7377.2 [I.V.:3122.2; Other:10; NG/GT:2490; IV Piggyback:1755] Out: 75 [Urine:75]  PHYSICAL EXAMINATION:  General:  Obese male, no distress, lying in bed  Neuro:  Sedated, does not follow commands, grimices to painful stimuli, does not moves extremities  HEENT:  ETT in place  Cardiovascular:  Aflutter on monitor, rate controlled. No MRG Lungs:  Diminished bases, non-labored, on vent  Abdomen:  Soft, non-distended, active bowel sounds  Musculoskeletal:  No acute deformity Skin:  Warm, dry, intact, diffuse red/hot rash noted to upper extremities and flanks/back  LABS:  BMET  Recent Labs Lab 12/09/16 0545 12/10/16 0320 12/23/2016 0318  NA 147* 147* 145  K 4.0 4.4 5.5*  CL 114* 116* 113*  CO2 20* 15* 15*  BUN 195* 237* 266*  CREATININE 5.00* 7.16* 7.66*  GLUCOSE 178* 274* 278*    Electrolytes  Recent Labs Lab 12/06/16 0250  12/09/16 0545 12/09/16 1330 12/10/16 0320 12/24/2016 0318  CALCIUM 8.1*  < > 7.7*  --  7.4* 7.5*  MG 2.6*  --   --   --  2.7* 2.9*  PHOS 3.5  --   --  5.3*  --  5.9*  < > = values in this interval not displayed.  CBC  Recent Labs Lab 12/09/16 0545 12/10/16 0320 12/13/2016 0318  WBC 7.0 6.2 6.9  HGB 12.4* 11.2* 12.4*  HCT 39.7 36.3* 39.9  PLT 288 269 322   Coag's No results for input(s): APTT, INR in the last 168 hours.  Sepsis Markers No results for input(s): LATICACIDVEN, PROCALCITON, O2SATVEN in the last 168 hours.  ABG No results for input(s): PHART, PCO2ART, PO2ART in the last 168 hours.  Liver Enzymes  Recent Labs Lab 12/10/16 0320  AST 84*  ALT 29  ALKPHOS 80  BILITOT 0.7  ALBUMIN 1.2*   Cardiac Enzymes No results for input(s): TROPONINI, PROBNP in the last 168 hours.  Glucose  Recent Labs Lab 12/10/16 1124 12/10/16 1558 12/10/16 1928 12/10/16 2337 12/07/2016 0338 12/06/2016 0753  GLUCAP 305* 274* 278* 242* 233* 275*   Imaging US Renal  Result Date: 12/10/2016 CLINICAL DATA:  Acute renal failure, patient ventilated. EXAM: RENAL / URINARY TRACT ULTRASOUND COMPLETE COMPARISON:  CT  10/26/2016, ultrasound 10/26/2016 FINDINGS: Right Kidney: Length: 12.9 cm. Echogenicity of the kidney is increased. No mass or hydronephrosis visualized. No obstructive uropathy. Left Kidney: Length: 13.4 cm. Echogenicity of the kidney is limits. No mass or hydronephrosis visualized. No obstructive uropathy. Bladder: Foley catheter decompressed bladder. Suboptimal assessment as a result. IMPRESSION: Increased echogenicity of both kidneys suspicious for medical renal disease. No obstructive uropathy. Bladder is decompressed by Foley catheter. Electronically Signed   By: Tollie Eth M.D.   On: 12/10/2016 14:54    STUDIES:  Echo Jan 2017 > LVEF 35-40% with regional wall motion abnormalities,akinesis of the anteroseptal and apical  myocardium CT Head 1/4 > large falcine subdural hematoma measuring up to 19 mm thick unchanged, Minimal subarachnoid hemorrhage at sulci along the superior aspect of the interhemispheric fissure. No intracranial mass, intracerebral hemorrhage or evidence of acute infarction CXR 1/4 > Interstitial and alveolar pulmonary edema pattern without definite pleural effusions EEG 1/5 > Diffuse slowing of background, no seizures seen  EEG 1/6 > generalized polymorphic delta slowing, suggesting severe encephalopathy, no seizures  MRI Head 1/7 > Stable parafalcine and left tentorium subdural hematoma as well as parafalcine subarachnoid hemorrhage Echo 1/20 > 35-40%, reduced systolic function   CULTURES: UA 1/4 > Neg Urine 1/4 > Neg Blood 1/4 > Neg  Flu 1/4 > Neg BC 1/15 > Staph in aerobic bottle MRSA by reflex panel >neg Urine 1/15 > neg  Trach asp 1/15 > Serratia Marcesens  Blood 1/18 >   ANTIBIOTICS: Rocephin 1/4 >1/4  1/6 > Vancomycin 1/4 >1/7 Zosyn 1/4 > 1/7 Vanocmycin 1/16 >>> Rocephin 1/17 >>   SIGNIFICANT EVENTS: 1/4 > Presents to ED with weakness and headache  1/5 > Intubated   LINES/TUBES: ETT 1/4 >>  R PICC 1/13 > 1/16  ASSESSMENT / PLAN:  PULMONARY A: Acute on Chronic Respiratory Failure  H/O COPD, OSA (on CPAP at HS)  Pulm edema-worse aeration 1/14  P:   Vent Support - Wean as tolerated (failed today due to high RR) Will need Trach - ENT called  Continue Pulmicort and xopenex.  CARDIOVASCULAR A: Hypotension   CHF  H/O Afib/flutter (was on Xarelto)- now rate controlled. HTN  P: Telemetry Continue Neo for MAP >65 Continue PO diltiazem Restart home xarelto on 1/19 if clinically indicated   RENAL A:   AKI worsening (SCr 7 up from 5)  Hypokalemia - improved  Hypernatremia -156>154>145 > 151 > 147 Hypocalcemia  P:  Trend BMP Nephology following - will start CVVDH today  Continue free water Ionized calcium - in progress   GASTROINTESTINAL A:    GERD  P:   TF continue PPI renal dose pepcid  HEMATOLOGIC A:  On Chronic Anticoagulation (FFP administered in ED)  P:  SCD Trend CBC  Continue to hold Xarelto as above  INFECTIOUS A:   Bacteremia, MRSA CAP  -PICC removed 1/17  P:   Continue Vancomycin Continue Rocephin   ENDOCRINE A:   DM, controlled P:   SSI  TF coverage  Continue Lantus 30 units BID   NEUROLOGIC A:   Interhemispheric SDH  -recent falls and weakness to right lower extremity  R side weakness P:   PRN Fentanyl, wean fentanyl gtt to achieve RASS  Continue Keppra per neurology  Dilantin level pending RASS goal: 0  FAMILY  - Updates: Wife updated 1/18 regarding poor progression and likely need for trach.   - Inter-disciplinary family meet or Palliative Care meeting on-going.   Jovita Kussmaul, AG-ACNP [facility]  Pgr: [phone]  PCCM Pgr: [phone]  STAFF NOTE: Cindi Carbon, MD FACP have personally reviewed patient's available data, including medical history, events of note, physical examination and test results as part of my evaluation. I have discussed with resident/NP and other care providers such as pharmacist, RN and RRT. In addition, I personally evaluated patient and elicited key findings of: poor response now likely related to super dilantin, prior imaging dies not explain neurostatus and noted was purposeful days prior, will re image brain if after correction metabolic stressors ( ARF) and dilantin he is not improved, may need repeat eeg as dilantin can also cause seizure, for hd now, appreciate Dr Jenne Pane help as he is a poor candidate for bedside trach, with serratia would consider longer duration ABX ctx, for staph epi, line is removed, consider 7 days ABX vanc as line has been removed, feeding after trach planned The patient is critically ill with multiple organ systems failure and requires high complexity decision making for assessment and support,  frequent evaluation and titration of therapies, application of advanced monitoring technologies and extensive interpretation of multiple databases.   Critical Care Time devoted to patient care services described in this note is 35  Minutes. This time reflects time of care of this signee: Rory Percy, MD FACP. This critical care time does not reflect procedure time, or teaching time or supervisory time of PA/NP/Med student/Med Resident etc but could involve care discussion time. Rest per NP/medical resident whose note is outlined above and that I agree with   Mcarthur Rossetti. [name], MD, FACP Pgr: [phone] [facility] 12-24-16 12:49 PM

## 2016-12-11 NOTE — Consult Note (Signed)
   [facility] Inpatient Consult   12/03/2016  Steban Madden [place] 1961/06/11 [id number]    [facility] Care Management follow up after discussion with [facility] Care Community team and [facility] Care Management MD Director. Noted Mr. Quattrone status in ICU on vent support and requires trach. Voicemail left for inpatient RNCM to inquire about whether palliative discussions or palliative consult has been discussed with wife/family and inpatient team. [facility] Care Management will continue to follow.  [name], MSN-Ed, RN,BSN [facility] Liaison [phone]

## 2016-12-11 NOTE — Progress Notes (Signed)
Received a call that CRRT filter is clotting. Patient was not placed on any anticoagulation. Will add heparin and continue to follow  York Valliant A

## 2016-12-11 NOTE — Anesthesia Preprocedure Evaluation (Addendum)
Anesthesia Evaluation  Patient identified by MRN, date of birth, ID band Patient unresponsive    Reviewed: Allergy & Precautions, NPO status , Patient's Chart, lab work & pertinent test results, reviewed documented beta blocker date and time   Airway Mallampati: Intubated       Dental   Pulmonary sleep apnea and Continuous Positive Airway Pressure Ventilation , pneumonia, COPD, former smoker,     + decreased breath sounds      Cardiovascular hypertension, Pt. on medications and Pt. on home beta blockers + Past MI and +CHF   Rhythm:Regular Rate:Normal     Neuro/Psych    GI/Hepatic GERD  ,  Endo/Other  diabetes, Poorly Controlled, Type 2Hypothyroidism   Renal/GU DialysisRenal disease     Musculoskeletal   Abdominal   Peds  Hematology   Anesthesia Other Findings   Reproductive/Obstetrics                            Anesthesia Physical Anesthesia Plan  ASA: IV  Anesthesia Plan: General   Post-op Pain Management:    Induction: Inhalational and Intravenous  Airway Management Planned: Tracheostomy  Additional Equipment: None  Intra-op Plan:   Post-operative Plan:   Informed Consent: I have reviewed the patients History and Physical, chart, labs and discussed the procedure including the risks, benefits and alternatives for the proposed anesthesia with the patient or authorized representative who has indicated his/her understanding and acceptance.     Plan Discussed with: CRNA, Anesthesiologist and Surgeon  Anesthesia Plan Comments:         Anesthesia Quick Evaluation

## 2016-12-11 NOTE — Brief Op Note (Signed)
12/01/2016 - 01-07-17  6:07 PM  PATIENT:  Jeremiah Perkins  56 y.o. male  PRE-OPERATIVE DIAGNOSIS:  Resp. Distress  POST-OPERATIVE DIAGNOSIS:  Resp. Distress  PROCEDURE:  Procedure(s): TRACHEOSTOMY (N/A)  SURGEON:  Surgeon(s) and Role:    * Christia Reading, MD - Primary  PHYSICIAN ASSISTANT:   ASSISTANTS: none   ANESTHESIA:   general  EBL:  Total I/O In: 1508.9 [I.V.:923.9; NG/GT:180; IV Piggyback:405] Out: 441 [Other:441]  BLOOD ADMINISTERED:none  DRAINS: none   LOCAL MEDICATIONS USED:  XYLOCAINE   SPECIMEN:  No Specimen  DISPOSITION OF SPECIMEN:  N/A  COUNTS:  YES  TOURNIQUET:  * No tourniquets in log *  DICTATION: .Other Dictation: Dictation Number [id number]  PLAN OF CARE: Return to ICU room  PATIENT DISPOSITION:  ICU - intubated and hemodynamically stable.   Delay start of Pharmacological VTE agent (>24hrs) due to surgical blood loss or risk of bleeding: no

## 2016-12-11 NOTE — Progress Notes (Addendum)
Patient is one hour post op from receiving trach. No NGT. Joneen Roach instructed to hold PO meds until the morning and restart CRRT with heparin at midnight.

## 2016-12-11 NOTE — Patient Outreach (Signed)
       This RNCM made telephone contact to offer emotional support. Mrs. Dini states she has taken over her husband's responsibilities in taking over his (patient's) brother's business affairs. Mrs. Dante Gang reports need for patient's clinical update on [facility] letterhead to provide to patient' attorney to be used to transfer authority to her while patient is hospitalized. This RNCM advised Mrs. Eisel to request information when she comes to visit as she had in the past.  Wife states she probably will not need information until Monday.  Mrs. Davilla reports having been updated daily by healthcare team at [facility].  She further reports his care includes possibly a trach today, already has placed a catheter for hemodialysis. Mrs. Prim states she has been updated frequently about patient's care.

## 2016-12-11 NOTE — Procedures (Signed)
History: 56 yo M with subdural hematoma  Sedation: fentanyl  Technique: This is a 21 channel routine scalp EEG performed at the bedside with bipolar and monopolar montages arranged in accordance to the international 10/20 system of electrode placement. One channel was dedicated to EKG recording.    Background: The background consists of generalized irregular delta activity without clear asymmetry. There was no PDR seen.   Photic stimulation: Physiologic driving is not performed  EEG Abnormalities: 1) Generalized irregular slow activity 2) Absent PDR  Clinical Interpretation: This normal EEG is consistent with a moderate to severe generalized non-specific cerebral dysfunction(encephalopathy). There was no seizure or seizure predisposition recorded on this study. Please note that a normal EEG does not preclude the possibility of epilepsy.   Ritta Slot, MD [facility] [phone]  If 7pm- 7am, please page neurology on call as listed in AMION.

## 2016-12-11 NOTE — Progress Notes (Signed)
Pharmacy Antibiotic Note  Jeremiah Perkins is a 56 y.o. male admitted on 11/24/2016 with bacteremia.  SCr continues to worsen. Patient initially received 2 g of vanc on 1/16 and random level this am therapeutic at 20  Plan: Vancomycin 1 g x 1 F/U random level likely sat F/u renal fx Patient needs repeat bcx  Height: 5\' 7"  (170.2 cm) Weight: 279 lb 12.2 oz (126.9 kg) IBW/kg (Calculated) : 66.1  Temp (24hrs), Avg:99.2 F (37.3 C), Min:97.8 F (36.6 C), Max:101.1 F (38.4 C)   Recent Labs Lab 12/07/16 0600 12/07/16 1800 12/08/16 0600 12/09/16 0545 12/10/16 0320 11/24/2016 0318 12/09/2016 0750  WBC 8.0  --  7.0 7.0 6.2 6.9  --   CREATININE 2.00* 2.78* 3.49* 5.00* 7.16* 7.66*  --   VANCORANDOM  --   --   --   --   --   --  20    Estimated Creatinine Clearance: 13.6 mL/min (by C-G formula based on SCr of 7.66 mg/dL (H)).    No Known Allergies  Isaac Bliss, PharmD, BCPS, BCCCP Clinical Pharmacist Clinical phone for 12/15/2016 from 7a-3:30p: [phone] If after 3:30p, please call main pharmacy at: [phone] 11/27/2016 9:01 AM

## 2016-12-11 NOTE — Anesthesia Postprocedure Evaluation (Signed)
Anesthesia Post Note  Patient: Jeremiah Perkins  Procedure(s) Performed: Procedure(s) (LRB): TRACHEOSTOMY (N/A)  Patient location during evaluation: ICU Anesthesia Type: General Level of consciousness: patient remains intubated per anesthesia plan Vital Signs Assessment: post-procedure vital signs reviewed and stable Respiratory status: patient remains intubated per anesthesia plan Cardiovascular status: stable Anesthetic complications: no       Last Vitals:  Vitals:   12/07/2016 1700 12/06/2016 1715  BP: 130/73 124/72  Pulse: (!) 104 85  Resp: (!) 21 20  Temp:      Last Pain:  Vitals:   12/08/2016 1600  TempSrc: Axillary  PainSc:                  Rainna Nearhood

## 2016-12-11 NOTE — Transfer of Care (Signed)
Immediate Anesthesia Transfer of Care Note  Patient: Jeremiah Perkins  Procedure(s) Performed: Procedure(s): TRACHEOSTOMY (N/A)  Patient Location: ICU  Anesthesia Type:General  Level of Consciousness: unresponsive and Patient remains intubated per anesthesia plan  Airway & Oxygen Therapy: Patient remains intubated per anesthesia plan and Patient placed on Ventilator (see vital sign flow sheet for setting)  Post-op Assessment: Report given to RN and Post -op Vital signs reviewed and stable  Post vital signs: Reviewed and stable  Last Vitals:  Vitals:   12/21/2016 1700 12-21-2016 1715  BP: 130/73 124/72  Pulse: (!) 104 85  Resp: (!) 21 20  Temp:      Last Pain:  Vitals:   December 21, 2016 1600  TempSrc: Axillary  PainSc:          Complications: No apparent anesthesia complications

## 2016-12-11 NOTE — Progress Notes (Signed)
EEG completed, results pending. 

## 2016-12-11 NOTE — Procedures (Signed)
Hemodialysis Catheter Insertion Procedure Note Jeremiah Perkins [id number] Feb 18, 1961  Procedure: Insertion of Hemodialysis Catheter Indications: Hemodialysis  Procedure Details Consent: Risks of procedure as well as the alternatives and risks of each were explained to the (patient/caregiver).  Consent for procedure obtained.  Time Out: Verified patient identification, verified procedure, site/side was marked, verified correct patient position, special equipment/implants available, medications/allergies/relevent history reviewed, required imaging and test results available.  Performed  Maximum sterile technique was used including antiseptics, cap, gloves, gown, hand hygiene, mask and sheet.  Skin prep: Chlorhexidine; local anesthetic administered  A Trialysis HD catheter was placed in the right internal jugular vein using the Seldinger technique.  Evaluation Blood flow good Complications: No apparent complications Patient did tolerate procedure well. Chest X-ray ordered to verify placement.  CXR: pending.  Jovita Kussmaul, AG-ACNP [facility]  Pgr: [phone]  PCCM Pgr: [phone]  [place] needed Required  Jeremiah Perkins. [name], MD, FACP Pgr: [phone] [facility]

## 2016-12-11 NOTE — Consult Note (Signed)
Reason for Consult: Tracheostomy Referring Physician: CCM  KEELYN FJELSTAD is an 56 y.o. male.  HPI: 56 year old male with multiple medical problems admitted 12/06/2016 with seizures and subdural hematoma and ultimately required intubation.  He has had a slow recovery and has been unable to wean from the ventilator.  Tracheostomy was requested.  Past Medical History:  Diagnosis Date  . Anxiety   . Arthritis    involving knees, ankles, back, elbows (01/02/2015)  . Asthma   . Atrial flutter ([place])   . Cholelithiasis   . Chronic back pain   . Chronic bronchitis ([place])    "get it q yr" (01/02/2015)  . Congestive heart failure ([place])    No echo data available. Myocardial perfusion 2005 with EF 61%, presumed diastolic HF   . COPD (chronic obstructive pulmonary disease) ([place])   . Diabetic peripheral neuropathy ([place])   . Diverticulosis    with history of diverticulitis in 10/2011  . GERD (gastroesophageal reflux disease)   . Gout   . Hyperlipidemia   . Hypertension   . Major depression   . Morbid obesity ([place])   . Myocardial infarction    "one doctor said I did back in 2014"  . OSA (obstructive sleep apnea) 1998   "wear my CPAP a couple times/month" (01/02/2015)  . Pneumonia 2013; 2014  . Psychosexual dysfunction with inhibited sexual excitement   . Shortness of breath    "all the time" (07/26/2013)  . Type II diabetes mellitus ([place]) dx'd 1991   previously on insulin in 2000 for 3-4 years (but was stopped because adamantly did not want to be on insulin)    Past Surgical History:  Procedure Laterality Date  . 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 ANKLE RECONSTRUCTION Right 1990's   2/2 trauma sustained after fall  . CARDIAC CATHETERIZATION  1990's; 07/2013  . CARDIOVERSION N/A 07/28/2013   Procedure: CARDIOVERSION;  Surgeon: Larey Dresser, MD;  Location: [facility] ENDOSCOPY;  Service: Cardiovascular;  Laterality: N/A;  . CARDIOVERSION N/A 10/26/2013   Procedure: CARDIOVERSION;  Surgeon: [name], MD;  Location: [place];  Service: Cardiovascular;  Laterality: N/A;  . CHOLECYSTECTOMY N/A 08/24/2015   Procedure: LAPAROSCOPIC CHOLECYSTECTOMY WITH INTRAOPERATIVE CHOLANGIOGRAM;  Surgeon: Jackolyn Confer, MD;  Location: [facility] ORS;  Service: General;  Laterality: N/A;  . CYST REMOVAL LEG Left 1960's   back of  leg  . FINGER AMPUTATION    . LEFT HEART CATHETERIZATION WITH CORONARY ANGIOGRAM N/A 07/27/2013   Procedure: LEFT HEART CATHETERIZATION WITH CORONARY ANGIOGRAM;  Surgeon: Peter M Martinique, MD;  Location: [facility] CATH LAB;  Service: Cardiovascular;  Laterality: N/A;  . TEE WITHOUT CARDIOVERSION N/A 07/28/2013   Procedure: TRANSESOPHAGEAL ECHOCARDIOGRAM (TEE);  Surgeon: Larey Dresser, MD;  Location: [facility] ENDOSCOPY;  Service: Cardiovascular;  Laterality: N/A;    Family History  Problem Relation Age of Onset  . Diabetes Mother   . CAD Mother 39    requiring quadruple bypass  . Hypertension Mother   . Alzheimer's disease Mother   . Stomach cancer Father   . Lung cancer Father     was a smoker  . Gout Father   . CAD Brother 14    requiring quadruple bypass  . Seizures Brother   . Hypertension Brother   . Diabetes Maternal Grandmother   . Alzheimer's disease Maternal Grandmother   . Breast cancer Paternal Aunt   . Diabetes Maternal Aunt   . Seizures Paternal Uncle  Social History:  reports that he quit smoking about 14 months ago. His smoking use included Cigarettes. He has a 1.20 pack-year smoking history. He has never used smokeless tobacco. He reports that he drinks alcohol. He reports that he uses drugs, including Marijuana and Cocaine.  Allergies: No Known Allergies  Medications: I have reviewed the patient's current medications.  Results for orders placed or performed during the hospital encounter of 11/25/2016 (from the past 48 hour(s))  Phosphorus     Status: Abnormal   Collection Time: 12/09/16  1:30 PM  Result Value Ref Range   Phosphorus 5.3 (H) 2.5 - 4.6 mg/dL   Glucose, capillary     Status: Abnormal   Collection Time: 12/09/16  3:08 PM  Result Value Ref Range   Glucose-Capillary 182 (H) 65 - 99 mg/dL   Comment 1 Notify RN    Comment 2 Document in Chart   Glucose, capillary     Status: Abnormal   Collection Time: 12/09/16  7:59 PM  Result Value Ref Range   Glucose-Capillary 227 (H) 65 - 99 mg/dL  Glucose, capillary     Status: Abnormal   Collection Time: 12/09/16 11:18 PM  Result Value Ref Range   Glucose-Capillary 219 (H) 65 - 99 mg/dL  Triglycerides     Status: Abnormal   Collection Time: 12/10/16 12:19 AM  Result Value Ref Range   Triglycerides 320 (H) <150 mg/dL  Glucose, capillary     Status: Abnormal   Collection Time: 12/10/16  3:15 AM  Result Value Ref Range   Glucose-Capillary 230 (H) 65 - 99 mg/dL  CBC     Status: Abnormal   Collection Time: 12/10/16  3:20 AM  Result Value Ref Range   WBC 6.2 4.0 - 10.5 K/uL   RBC 3.94 (L) 4.22 - 5.81 MIL/uL   Hemoglobin 11.2 (L) 13.0 - 17.0 g/dL   HCT 36.3 (L) 39.0 - 52.0 %   MCV 92.1 78.0 - 100.0 fL   MCH 28.4 26.0 - 34.0 pg   MCHC 30.9 30.0 - 36.0 g/dL   RDW 18.4 (H) 11.5 - 15.5 %   Platelets 269 150 - 400 K/uL  Magnesium     Status: Abnormal   Collection Time: 12/10/16  3:20 AM  Result Value Ref Range   Magnesium 2.7 (H) 1.7 - 2.4 mg/dL  Comprehensive metabolic panel     Status: Abnormal   Collection Time: 12/10/16  3:20 AM  Result Value Ref Range   Sodium 147 (H) 135 - 145 mmol/L   Potassium 4.4 3.5 - 5.1 mmol/L   Chloride 116 (H) 101 - 111 mmol/L   CO2 15 (L) 22 - 32 mmol/L   Glucose, Bld 274 (H) 65 - 99 mg/dL   BUN 237 (H) 6 - 20 mg/dL   Creatinine, Ser 7.16 (H) 0.61 - 1.24 mg/dL   Calcium 7.4 (L) 8.9 - 10.3 mg/dL   Total Protein 5.3 (L) 6.5 - 8.1 g/dL   Albumin 1.2 (L) 3.5 - 5.0 g/dL   AST 84 (H) 15 - 41 U/L   ALT 29 17 - 63 U/L   Alkaline Phosphatase 80 38 - 126 U/L   Total Bilirubin 0.7 0.3 - 1.2 mg/dL   GFR calc non Af Amer 8 (L) >60 mL/min   GFR calc Af Amer 9  (L) >60 mL/min    Comment: (NOTE) The eGFR has been calculated using the CKD EPI equation. This calculation has not been validated in all clinical situations. eGFR's persistently <  60 mL/min signify possible Chronic Kidney Disease.    Anion gap 16 (H) 5 - 15  Glucose, capillary     Status: Abnormal   Collection Time: 12/10/16  8:04 AM  Result Value Ref Range   Glucose-Capillary 245 (H) 65 - 99 mg/dL  Glucose, capillary     Status: Abnormal   Collection Time: 12/10/16 11:24 AM  Result Value Ref Range   Glucose-Capillary 305 (H) 65 - 99 mg/dL  Glucose, capillary     Status: Abnormal   Collection Time: 12/10/16  3:58 PM  Result Value Ref Range   Glucose-Capillary 274 (H) 65 - 99 mg/dL  Glucose, capillary     Status: Abnormal   Collection Time: 12/10/16  7:28 PM  Result Value Ref Range   Glucose-Capillary 278 (H) 65 - 99 mg/dL  Glucose, capillary     Status: Abnormal   Collection Time: 12/10/16 11:37 PM  Result Value Ref Range   Glucose-Capillary 242 (H) 65 - 99 mg/dL  Basic metabolic panel     Status: Abnormal   Collection Time: 12/06/2016  3:18 AM  Result Value Ref Range   Sodium 145 135 - 145 mmol/L   Potassium 5.5 (H) 3.5 - 5.1 mmol/L    Comment: DELTA CHECK NOTED   Chloride 113 (H) 101 - 111 mmol/L   CO2 15 (L) 22 - 32 mmol/L   Glucose, Bld 278 (H) 65 - 99 mg/dL   BUN 266 (H) 6 - 20 mg/dL   Creatinine, Ser 7.66 (H) 0.61 - 1.24 mg/dL   Calcium 7.5 (L) 8.9 - 10.3 mg/dL   GFR calc non Af Amer 7 (L) >60 mL/min   GFR calc Af Amer 8 (L) >60 mL/min    Comment: (NOTE) The eGFR has been calculated using the CKD EPI equation. This calculation has not been validated in all clinical situations. eGFR's persistently <60 mL/min signify possible Chronic Kidney Disease.    Anion gap 17 (H) 5 - 15  CBC     Status: Abnormal   Collection Time: 12/02/2016  3:18 AM  Result Value Ref Range   WBC 6.9 4.0 - 10.5 K/uL   RBC 4.39 4.22 - 5.81 MIL/uL   Hemoglobin 12.4 (L) 13.0 - 17.0 g/dL   HCT  39.9 39.0 - 52.0 %   MCV 90.9 78.0 - 100.0 fL   MCH 28.2 26.0 - 34.0 pg   MCHC 31.1 30.0 - 36.0 g/dL   RDW 18.7 (H) 11.5 - 15.5 %   Platelets 322 150 - 400 K/uL  Magnesium     Status: Abnormal   Collection Time: 12/08/2016  3:18 AM  Result Value Ref Range   Magnesium 2.9 (H) 1.7 - 2.4 mg/dL  Phosphorus     Status: Abnormal   Collection Time: 12/08/2016  3:18 AM  Result Value Ref Range   Phosphorus 5.9 (H) 2.5 - 4.6 mg/dL  Glucose, capillary     Status: Abnormal   Collection Time: 11/30/2016  3:38 AM  Result Value Ref Range   Glucose-Capillary 233 (H) 65 - 99 mg/dL  Vancomycin, random     Status: None   Collection Time: 12/07/2016  7:50 AM  Result Value Ref Range   Vancomycin Rm 20     Comment:        Random Vancomycin therapeutic range is dependent on dosage and time of specimen collection. A peak range is 20.0-40.0 ug/mL A trough range is 5.0-15.0 ug/mL          Glucose, capillary  Status: Abnormal   Collection Time: 12/18/2016  7:53 AM  Result Value Ref Range   Glucose-Capillary 275 (H) 65 - 99 mg/dL   Comment 1 Notify RN    Comment 2 Document in Chart   AST     Status: Abnormal   Collection Time: 12/10/2016 10:11 AM  Result Value Ref Range   AST 69 (H) 15 - 41 U/L  ALT     Status: None   Collection Time: 12/20/2016 10:11 AM  Result Value Ref Range   ALT 32 17 - 63 U/L  Bilirubin, total     Status: None   Collection Time: 12/01/2016 10:11 AM  Result Value Ref Range   Total Bilirubin 0.6 0.3 - 1.2 mg/dL  Gamma GT     Status: Abnormal   Collection Time: 12/19/2016 10:11 AM  Result Value Ref Range   GGT 281 (H) 7 - 50 U/L  Glucose, capillary     Status: Abnormal   Collection Time: 12/20/2016 11:44 AM  Result Value Ref Range   Glucose-Capillary 252 (H) 65 - 99 mg/dL    US Renal  Result Date: 12/10/2016 CLINICAL DATA:  Acute renal failure, patient ventilated. EXAM: RENAL / URINARY TRACT ULTRASOUND COMPLETE COMPARISON:  CT 10/26/2016, ultrasound 10/26/2016 FINDINGS: Right  Kidney: Length: 12.9 cm. Echogenicity of the kidney is increased. No mass or hydronephrosis visualized. No obstructive uropathy. Left Kidney: Length: 13.4 cm. Echogenicity of the kidney is limits. No mass or hydronephrosis visualized. No obstructive uropathy. Bladder: Foley catheter decompressed bladder. Suboptimal assessment as a result. IMPRESSION: Increased echogenicity of both kidneys suspicious for medical renal disease. No obstructive uropathy. Bladder is decompressed by Foley catheter. Electronically Signed   By: Ashley Royalty M.D.   On: 12/10/2016 14:54    Review of Systems  Unable to perform ROS: Intubated   Blood pressure 116/82, pulse 77, temperature 97.8 F (36.6 C), temperature source Axillary, resp. rate (!) 31, height '5\' 7"'  (1.702 m), weight 279 lb 12.2 oz (126.9 kg), SpO2 100 %. Physical Exam  Constitutional: He appears well-developed and well-nourished. No distress.  Intubated and sedated.  Responds to pain.  HENT:  Head: Normocephalic and atraumatic.  Right Ear: External ear normal.  Left Ear: External ear normal.  Nose: Nose normal.  Orotracheally intubated.  Eyes:  Eyes closed.  Neck:  Thick, short neck.  Landmarks palpable.  Cardiovascular: Normal rate.   Respiratory:  Mechanically ventilated.  Neurological:  Sedated.  Skin: Skin is warm and dry.  Psychiatric:  Sedated.    Assessment/Plan: Respiratory failure Will proceed with tracheostomy placement this evening.  Tube feeds have been held.  I left a message with his wife to discuss details of the procedure further.  Bell Cai 12/08/2016, 11:49 AM

## 2016-12-11 NOTE — Progress Notes (Signed)
Inpatient Diabetes Program Recommendations  AACE/ADA: New Consensus Statement on Inpatient Glycemic Control (2015)  Target Ranges:  Prepandial:   less than 140 mg/dL      Peak postprandial:   less than 180 mg/dL (1-2 hours)      Critically ill patients:  140 - 180 mg/dL   Lab Results  Component Value Date   GLUCAP 275 (H) 11/30/2016   HGBA1C 8.5 (H) 10/26/2016    Review of Glycemic Control  Needs insulin adjustment. Discussed with NP.  Inpatient Diabetes Program Recommendations: Increase Lantus to 36 units bid Increase Novolog to 4 units Q4H.  Will continue to follow.  Thank you. Ailene Ards, RD, LDN, CDE Inpatient Diabetes Coordinator [phone]

## 2016-12-12 ENCOUNTER — Ambulatory Visit: Payer: PPO | Admitting: Internal Medicine

## 2016-12-12 ENCOUNTER — Inpatient Hospital Stay (HOSPITAL_COMMUNITY): Payer: PPO

## 2016-12-12 ENCOUNTER — Encounter (HOSPITAL_COMMUNITY): Payer: Self-pay | Admitting: Otolaryngology

## 2016-12-12 ENCOUNTER — Ambulatory Visit: Payer: PPO

## 2016-12-12 DIAGNOSIS — N179 Acute kidney failure, unspecified: Secondary | ICD-10-CM

## 2016-12-12 LAB — BLOOD CULTURE ID PANEL (REFLEXED)
Acinetobacter baumannii: NOT DETECTED
CANDIDA GLABRATA: NOT DETECTED
CANDIDA KRUSEI: NOT DETECTED
CANDIDA TROPICALIS: NOT DETECTED
Candida albicans: NOT DETECTED
Candida parapsilosis: NOT DETECTED
ENTEROBACTER CLOACAE COMPLEX: NOT DETECTED
ESCHERICHIA COLI: NOT DETECTED
Enterobacteriaceae species: NOT DETECTED
Enterococcus species: NOT DETECTED
Haemophilus influenzae: NOT DETECTED
KLEBSIELLA PNEUMONIAE: NOT DETECTED
Klebsiella oxytoca: NOT DETECTED
Listeria monocytogenes: NOT DETECTED
Methicillin resistance: DETECTED — AB
NEISSERIA MENINGITIDIS: NOT DETECTED
PROTEUS SPECIES: NOT DETECTED
Pseudomonas aeruginosa: NOT DETECTED
STAPHYLOCOCCUS AUREUS BCID: NOT DETECTED
STAPHYLOCOCCUS SPECIES: DETECTED — AB
Serratia marcescens: NOT DETECTED
Streptococcus agalactiae: NOT DETECTED
Streptococcus pneumoniae: NOT DETECTED
Streptococcus pyogenes: NOT DETECTED
Streptococcus species: NOT DETECTED

## 2016-12-12 LAB — RENAL FUNCTION PANEL
ALBUMIN: 1.1 g/dL — AB (ref 3.5–5.0)
Albumin: 1.2 g/dL — ABNORMAL LOW (ref 3.5–5.0)
Anion gap: 14 (ref 5–15)
Anion gap: 10 (ref 5–15)
BUN: 181 mg/dL — ABNORMAL HIGH (ref 6–20)
BUN: 117 mg/dL — AB (ref 6–20)
CHLORIDE: 109 mmol/L (ref 101–111)
CHLORIDE: 105 mmol/L (ref 101–111)
CO2: 19 mmol/L — ABNORMAL LOW (ref 22–32)
CO2: 22 mmol/L (ref 22–32)
Calcium: 7.9 mg/dL — ABNORMAL LOW (ref 8.9–10.3)
Calcium: 7.6 mg/dL — ABNORMAL LOW (ref 8.9–10.3)
Creatinine, Ser: 6.06 mg/dL — ABNORMAL HIGH (ref 0.61–1.24)
Creatinine, Ser: 4.11 mg/dL — ABNORMAL HIGH (ref 0.61–1.24)
GFR calc Af Amer: 17 mL/min — ABNORMAL LOW (ref >60)
GFR calc non Af Amer: 15 mL/min — ABNORMAL LOW (ref >60)
GFR, EST AFRICAN AMERICAN: 11 mL/min — AB (ref >60)
GFR, EST NON AFRICAN AMERICAN: 9 mL/min — AB (ref >60)
Glucose, Bld: 54 mg/dL — ABNORMAL LOW (ref 65–99)
Glucose, Bld: 184 mg/dL — ABNORMAL HIGH (ref 65–99)
PHOSPHORUS: 4.1 mg/dL (ref 2.5–4.6)
POTASSIUM: 4.3 mmol/L (ref 3.5–5.1)
Phosphorus: 4.9 mg/dL — ABNORMAL HIGH (ref 2.5–4.6)
Potassium: 5 mmol/L (ref 3.5–5.1)
Sodium: 142 mmol/L (ref 135–145)
Sodium: 137 mmol/L (ref 135–145)

## 2016-12-12 LAB — GLUCOSE, CAPILLARY
GLUCOSE-CAPILLARY: 94 mg/dL (ref 65–99)
GLUCOSE-CAPILLARY: 108 mg/dL — AB (ref 65–99)
GLUCOSE-CAPILLARY: 56 mg/dL — AB (ref 65–99)
GLUCOSE-CAPILLARY: 71 mg/dL (ref 65–99)
GLUCOSE-CAPILLARY: 94 mg/dL (ref 65–99)
GLUCOSE-CAPILLARY: 140 mg/dL — AB (ref 65–99)
GLUCOSE-CAPILLARY: 185 mg/dL — AB (ref 65–99)
Glucose-Capillary: 62 mg/dL — ABNORMAL LOW (ref 65–99)
Glucose-Capillary: 30 mg/dL — CL (ref 65–99)
Glucose-Capillary: 65 mg/dL (ref 65–99)
Glucose-Capillary: 48 mg/dL — ABNORMAL LOW (ref 65–99)
Glucose-Capillary: 79 mg/dL (ref 65–99)

## 2016-12-12 LAB — ANTINUCLEAR ANTIBODIES, IFA: ANTINUCLEAR ANTIBODIES, IFA: NEGATIVE

## 2016-12-12 LAB — CBC
HEMATOCRIT: 37 % — AB (ref 39.0–52.0)
HEMOGLOBIN: 11.8 g/dL — AB (ref 13.0–17.0)
MCH: 28.4 pg (ref 26.0–34.0)
MCHC: 31.9 g/dL (ref 30.0–36.0)
MCV: 88.9 fL (ref 78.0–100.0)
Platelets: 339 10*3/uL (ref 150–400)
RBC: 4.16 MIL/uL — ABNORMAL LOW (ref 4.22–5.81)
RDW: 18.7 % — ABNORMAL HIGH (ref 11.5–15.5)
WBC: 7.3 10*3/uL (ref 4.0–10.5)

## 2016-12-12 LAB — PHENYTOIN LEVEL, FREE AND TOTAL
PHENYTOIN FREE: 3 ug/mL — AB (ref 1.0–2.0)
PHENYTOIN, TOTAL: 8.1 ug/mL — AB (ref 10.0–20.0)

## 2016-12-12 LAB — C4 COMPLEMENT: Complement C4, Body Fluid: 32 mg/dL (ref 14–44)

## 2016-12-12 LAB — POCT ACTIVATED CLOTTING TIME
ACTIVATED CLOTTING TIME: 153 s
ACTIVATED CLOTTING TIME: 158 s
ACTIVATED CLOTTING TIME: 169 s
ACTIVATED CLOTTING TIME: 175 s
ACTIVATED CLOTTING TIME: 191 s
ACTIVATED CLOTTING TIME: 197 s
ACTIVATED CLOTTING TIME: 197 s
Activated Clotting Time: 153 seconds
Activated Clotting Time: 147 seconds
Activated Clotting Time: 153 seconds
Activated Clotting Time: 158 seconds
Activated Clotting Time: 175 seconds
Activated Clotting Time: 164 seconds
Activated Clotting Time: 180 seconds
Activated Clotting Time: 175 seconds
Activated Clotting Time: 186 seconds
Activated Clotting Time: 191 seconds
Activated Clotting Time: 191 seconds

## 2016-12-12 LAB — CULTURE, BLOOD (ROUTINE X 2)

## 2016-12-12 LAB — APTT: aPTT: 41 seconds — ABNORMAL HIGH (ref 24–36)

## 2016-12-12 LAB — ANCA TITERS: C-ANCA: <1:20 {titer}

## 2016-12-12 LAB — TRIGLYCERIDES: TRIGLYCERIDES: 345 mg/dL — AB (ref <150)

## 2016-12-12 LAB — MAGNESIUM: MAGNESIUM: 2.6 mg/dL — AB (ref 1.7–2.4)

## 2016-12-12 LAB — C3 COMPLEMENT: C3 COMPLEMENT: 164 mg/dL (ref 82–167)

## 2016-12-12 MED ORDER — DEXTROSE 50 % IV SOLN
INTRAVENOUS | Status: AC
  Filled 2016-12-12: qty 50

## 2016-12-12 MED ORDER — DEXTROSE 50 % IV SOLN
1.0000 | Freq: Once | INTRAVENOUS | Status: AC
  Administered 2016-12-12: 50 mL via INTRAVENOUS

## 2016-12-12 MED ORDER — DEXTROSE 50 % IV SOLN
25.0000 mL | Freq: Once | INTRAVENOUS | Status: AC
  Administered 2016-12-12: 25 mL via INTRAVENOUS

## 2016-12-12 MED ORDER — DEXTROSE 50 % IV SOLN
INTRAVENOUS | Status: AC
  Administered 2016-12-12: 50 mL
  Filled 2016-12-12: qty 50

## 2016-12-12 MED ORDER — DEXTROSE 5 % IV SOLN
INTRAVENOUS | Status: DC
  Administered 2016-12-12: 04:00:00 via INTRAVENOUS

## 2016-12-12 MED ORDER — PROPOFOL 1000 MG/100ML IV EMUL
5.0000 ug/kg/min | INTRAVENOUS | Status: DC
  Administered 2016-12-12: 15 ug/kg/min via INTRAVENOUS
  Administered 2016-12-12: 30 ug/kg/min via INTRAVENOUS
  Administered 2016-12-12 – 2016-12-13 (×2): 20 ug/kg/min via INTRAVENOUS
  Administered 2016-12-13: 38.181 ug/kg/min via INTRAVENOUS
  Administered 2016-12-13: 19.025 ug/kg/min via INTRAVENOUS
  Administered 2016-12-13: 33.359 ug/kg/min via INTRAVENOUS
  Administered 2016-12-14: 35 ug/kg/min via INTRAVENOUS
  Administered 2016-12-14: 50 ug/kg/min via INTRAVENOUS
  Administered 2016-12-14: 60 ug/kg/min via INTRAVENOUS
  Administered 2016-12-14 (×2): 45 ug/kg/min via INTRAVENOUS
  Administered 2016-12-14: 30 ug/kg/min via INTRAVENOUS
  Administered 2016-12-14: 45 ug/kg/min via INTRAVENOUS
  Administered 2016-12-14: 50 ug/kg/min via INTRAVENOUS
  Administered 2016-12-15: 45 ug/kg/min via INTRAVENOUS
  Administered 2016-12-15: 40 ug/kg/min via INTRAVENOUS
  Administered 2016-12-15: 30 ug/kg/min via INTRAVENOUS
  Filled 2016-12-12 (×18): qty 100

## 2016-12-12 MED ORDER — VANCOMYCIN HCL 10 G IV SOLR
1250.0000 mg | INTRAVENOUS | Status: DC
  Administered 2016-12-12 – 2016-12-15 (×4): 1250 mg via INTRAVENOUS
  Filled 2016-12-12 (×6): qty 1250

## 2016-12-12 MED ORDER — INSULIN ASPART 100 UNIT/ML ~~LOC~~ SOLN: 0.0000 [IU] | SUBCUTANEOUS | Status: DC

## 2016-12-12 MED ORDER — DEXTROSE 50 % IV SOLN
INTRAVENOUS | Status: AC
  Administered 2016-12-12: 25 mL via INTRAVENOUS
  Filled 2016-12-12: qty 50

## 2016-12-12 MED ORDER — METOPROLOL TARTRATE 5 MG/5ML IV SOLN
5.0000 mg | INTRAVENOUS | Status: DC | PRN
  Administered 2016-12-12: 5 mg via INTRAVENOUS
  Filled 2016-12-12: qty 5

## 2016-12-12 NOTE — Consult Note (Signed)
   [facility] CM Inpatient Consult   12/12/2016  Jeremiah Perkins 1961-08-05 [id number]     [facility] Care Management follow up. Went to ICU. Spoke with [name] nurse who indicates his wife was supposed to come to hospital today around 230pm but has not shown up yet. Telephone call made to [name] who states she had an appointment that ran over which prevented her from coming to the hospital. She states she does not have a clear understanding of everything that is going on and would like her family member who is a nurse to be with her to speak further about [name] condition and status. Discussed that she could benefit from a goals of care meeting. [name] is agreeable to this. [name]'s nurse requested MD to please consider ordering Palliative Consult. Noted Palliative Consult is pending. Appreciative of Mr. Scientist, forensic nurse, [name], collaboration with Clinical research associate.   [name] states she can be available on Monday before 11 am. Will continue to follow. Contact information provided to [name]. Contacted [name] at [phone].   [name], MSN-Ed, RN,BSN [facility] Liaison [phone]

## 2016-12-12 NOTE — Progress Notes (Signed)
Hypoglycemic Event  CBG: 62  Treatment: D50 IV 25 mL  Symptoms: Seizure  Follow-up CBG: [id number] CBG Result:94  Possible Reasons for Event: Inadequate meal intake  Comments/MD notified: Dr. Arsenio Loader notified. New orders received    Chapman Fitch

## 2016-12-12 NOTE — Progress Notes (Signed)
Hypoglycemic Event  CBG: 30  Treatment: D50 IV 50 mL  Symptoms: seizure  Follow-up CBG: [id number] CBG Result:108  Possible Reasons for Event: NPO and recent adjustments to insulin coverage  Comments/MD notified:Dr. Arsenio Loader at [facility]. New orders received.    Chapman Fitch

## 2016-12-12 NOTE — Progress Notes (Signed)
eLink Physician-Brief Progress Note Patient Name: Jeremiah Perkins DOB: [birth year] MRN: [id number]   Date of Service  12/12/2016  HPI/Events of Note  Hypoglycemia - Blood glucose = 62. Patient given D50 per hypoglycemia protocol.   eICU Interventions  Will order: 1. D5W IV infusion to run IV at 40 mL/hour.      Intervention Category Major Interventions: Other:  [name] 12/12/2016, 3:27 AM

## 2016-12-12 NOTE — Progress Notes (Addendum)
eLink Physician-Brief Progress Note Patient Name: Jeremiah Perkins DOB: 1961/06/21 MRN: [id number]   Date of Service  12/12/2016  HPI/Events of Note  Seizure - Blood glucose = 30. Patient has already received D50 1 amp IV. Patient received Lantus 30 units at 10 PM yesterday.  eICU Interventions  Will order: 1. D/C - Q 4 hour Novolog TF coverage. 2. Increase D50 IV infusion to 100 mL/hour. 3. Change Q 4 hour resistant Novolog SSI to Q 4 hour sensitive Novolog SSI coverage.  4. Hold further Lantus doses until patient re-assessed by rounding team.     Intervention Category Major Interventions: Other:  [name] 12/12/2016, 6:23 AM

## 2016-12-12 NOTE — Progress Notes (Signed)
PULMONARY / CRITICAL CARE MEDICINE   Name: Jeremiah Perkins MRN: [id number] DOB: 05-Sep-1961    ADMISSION DATE:  11/30/2016 CONSULTATION DATE:  12/14/2016  REFERRING MD:  Dr. Allena Katz   CHIEF COMPLAINT: SDH   Brief:   56 year old male with PMH of DM, COPD, CHF, OSA (on CPAP at HS), atrial flutter (on xarelto), and polysubstance abuse (patient report used cocaine night before admit). Present to ED on 1/3 with new SDH and seizures. Transferred to ICU and ultimately required intubation. Has been slow to improve since that time and remains on ventilator. No further seizures.   SUBJECTIVE:  Off Pressors, on fentanyl gtt. Had a witnessed seizure last night due to hypoglycemia   VITAL SIGNS: BP (!) 139/127   Pulse (!) 114   Temp 98.8 F (37.1 C) (Axillary)   Resp 20   Ht 5\' 7"  (1.702 m)   Wt 127.9 kg (281 lb 15.5 oz)   SpO2 100%   BMI 44.16 kg/m   HEMODYNAMICS:    VENTILATOR SETTINGS: Vent Mode: PRVC FiO2 (%):  [40 %] 40 % Set Rate:  [18 bmp] 18 bmp Vt Set:  [530 mL] 530 mL PEEP:  [5 cmH20] 5 cmH20 Plateau Pressure:  [16 cmH20] 16 cmH20  INTAKE / OUTPUT: I/O last 3 completed shifts: In: 4968.3 [I.V.:3213.3; Other:25; NG/GT:1070; IV Piggyback:660] Out: 1647 [Urine:70; WUJWJ:1914; Stool:100; Blood:15]  PHYSICAL EXAMINATION:  General:  Obese male, no distress, lying in bed  Neuro:  Sedated, does not follow commands, grimices, +gag HEENT:  Trach in place  Cardiovascular:  Aflutter on monitor, Tachy. No MRG Lungs:  Diminished bases, labored, abdominal breathing  Abdomen:  Soft, non-distended, active bowel sounds  Musculoskeletal:  No acute deformity Skin:  Warm, dry, intact, diffuse red/hot rash noted to upper extremities and flanks/back  LABS:  BMET  Recent Labs Lab 12-13-16 0318 12/13/16 1529 12/12/16 0509  NA 145 143 142  K 5.5* 4.7 5.0  CL 113* 113* 109  CO2 15* 16* 19*  BUN 266* 232* 181*  CREATININE 7.66* 7.28* 6.06*  GLUCOSE 278* 228* 54*    Electrolytes  Recent Labs Lab 12/10/16 0320 12-13-16 0318 2016/12/13 1529 12/12/16 0509  CALCIUM 7.4* 7.5* 7.6* 7.9*  MG 2.7* 2.9*  --  2.6*  PHOS  --  5.9* 4.8* 4.9*    CBC  Recent Labs Lab 12/10/16 0320 12-13-2016 0318 12/12/16 0509  WBC 6.2 6.9 7.3  HGB 11.2* 12.4* 11.8*  HCT 36.3* 39.9 37.0*  PLT 269 322 339   Coag's  Recent Labs Lab 12/12/16 0509  APTT 41*    Sepsis Markers No results for input(s): LATICACIDVEN, PROCALCITON, O2SATVEN in the last 168 hours.  ABG No results for input(s): PHART, PCO2ART, PO2ART in the last 168 hours.  Liver Enzymes  Recent Labs Lab 12/10/16 0320 12-13-2016 1011 December 13, 2016 1529 12/12/16 0509  AST 84* 69*  --   --   ALT 29 32  --   --   ALKPHOS 80  --   --   --   BILITOT 0.7 0.6  --   --   ALBUMIN 1.2*  --  1.1* 1.2*   Cardiac Enzymes No results for input(s): TROPONINI, PROBNP in the last 168 hours.  Glucose  Recent Labs Lab December 13, 2016 2338 12/12/16 0320 12/12/16 0342 12/12/16 0617 12/12/16 0635 12/12/16 0813  GLUCAP 117* 62* 94 30* 108* 56*   Imaging Dg Chest Port 1 View  Result Date: 12/12/2016 CLINICAL DATA:  Vent dependant Hx of Asthma, CHF,  DM, HTN, COPD EXAM: PORTABLE CHEST - 1 VIEW COMPARISON:  the previous day's study FINDINGS: Endotracheal tube is been removed and tracheostomy device has been placed in expected location. Stable right IJ hemodialysis catheter. The nasogastric tube is been removed. No pneumothorax. Heart size upper limits normal for technique. Moderate central pulmonary vascular congestion and mild interstitial edema, perhaps slightly improved since previous exam. No definite effusion. Visualized bones unremarkable. IMPRESSION: 1. Tracheostomy placement without apparent complication. 2. Minimal improvement in bilateral edema/infiltrates. Electronically Signed   By: Corlis Leak M.D.   On: 12/12/2016 08:18   Dg Chest Portable 1 View  Result Date: 11/26/2016 CLINICAL DATA:  Right-sided central  line placement EXAM: PORTABLE CHEST 1 VIEW COMPARISON:  12/09/2016 chest radiograph. FINDINGS: Endotracheal tube tip is 3.5 cm above the carina. Enteric tube enters stomach with the tip not seen on this image. Right internal jugular central venous catheter terminates in the lower third of the superior vena cava. Stable cardiomediastinal silhouette with mild cardiomegaly and aortic atherosclerosis. No pneumothorax. Stable small bilateral pleural effusions. Mild to moderate pulmonary edema appears slightly worsened. IMPRESSION: 1. No pneumothorax. Right internal jugular central venous catheter terminates in the lower third of the superior vena cava . Additional support structures as described. 2. Mild-to-moderate congestive heart failure, slightly worsened . 3. Stable small bilateral pleural effusions. Electronically Signed   By: Delbert Phenix M.D.   On: 12/17/2016 11:48    STUDIES:  Echo Jan 2017 > LVEF 35-40% with regional wall motion abnormalities,akinesis of the anteroseptal and apical myocardium CT Head 1/4 > large falcine subdural hematoma measuring up to 19 mm thick unchanged, Minimal subarachnoid hemorrhage at sulci along the superior aspect of the interhemispheric fissure. No intracranial mass, intracerebral hemorrhage or evidence of acute infarction CXR 1/4 > Interstitial and alveolar pulmonary edema pattern without definite pleural effusions EEG 1/5 > Diffuse slowing of background, no seizures seen  EEG 1/6 > generalized polymorphic delta slowing, suggesting severe encephalopathy, no seizures  MRI Head 1/7 > Stable parafalcine and left tentorium subdural hematoma as well as parafalcine subarachnoid hemorrhage Echo 1/20 > 35-40%, reduced systolic function   CULTURES: UA 1/4 > Neg Urine 1/4 > Neg Blood 1/4 > Neg  Flu 1/4 > Neg BC 1/15 > Staph in aerobic bottle MRSA by reflex panel >neg Urine 1/15 > neg  Trach asp 1/15 > Serratia Marcesens  Blood 1/18 >   ANTIBIOTICS: Rocephin 1/4 >1/4   1/6 > Vancomycin 1/4 >1/7 Zosyn 1/4 > 1/7 Vanocmycin 1/16 >>>  Rocephin 1/17 >>   SIGNIFICANT EVENTS: 1/4 > Presents to ED with weakness and headache  1/5 > Intubated  1/18 > Trach via Dr. Jenne Pane   LINES/TUBES: ETT 1/4 >>  RHD 1/18 >>  R PICC 1/13 > 1/16  ASSESSMENT / PLAN:  PULMONARY A: Acute on Chronic Respiratory Failure  H/O COPD, OSA (on CPAP at HS)  Pulm edema-worse aeration 1/14  P:   Vent Support - Wean as tolerated  Continue Pulmicort and xopenex.  CARDIOVASCULAR A: Hypotension   CHF  H/O Afib/flutter (was on Xarelto)- now rate controlled. HTN  P: Telemetry Continue PO diltiazem Restart home xarelto on 1/19 if clinically indicated  - Currently on Heparin infusion with CVVHD  RENAL A:   AKI  Hypokalemia - improved  Hypernatremia -462>703>500 > 151 > 147 Hypocalcemia  P:  Trend BMP Nephology following - on CVVHD  Continue free water  GASTROINTESTINAL A:   GERD  P:   TF continue Place Cortrack  PPI renal dose pepcid  HEMATOLOGIC A:  On Chronic Anticoagulation (FFP administered in ED)  P:  SCD Trend CBC  Heparin for CVVHD clotting  Continue to hold Xarelto as above  INFECTIOUS A:   Bacteremia, MRSA CAP  -PICC removed 1/17  P:   Continue Vancomycin Continue Rocephin   ENDOCRINE A:   Hypoglycemia  DM, controlled P:   D/C Insulin  D5 gtt @ 100 ml/hr   NEUROLOGIC A:   Dilantin toxicity  Interhemispheric SDH  -recent falls and weakness to right lower extremity  R side weakness P:   Start Propofol gtt Fentanyl PRN, wean fentanyl gtt to achieve RASS  Continue Keppra per neurology  Dilantin level pending RASS goal: 0  FAMILY  - Updates: Wife updated 1/18 regarding poor progression and likely need for trach.   - Inter-disciplinary family meet or Palliative Care meeting on-going.   Jovita Kussmaul, AG-ACNP [facility]  Pgr: [phone]  PCCM Pgr: [phone]  Attending Note:  56 year old  male with SDH and encephalopathy who is now trached for failure to protect his airway.  Renal function has also deteriorated and CRRT was started when patient was on pressors (likely related to sedation for when patient was intubated).  On exam, remains unresponsive with clear lungs.  I reviewed CXR myself, trach is in good position.  Will attempt to minimize sedation.  Continue rocephin for serratia and f/u on blood cultures.  Vanc for MRSA in blood from PICC (now out but HD catheter in place).  Will need to speak with family about placement of a PEG tube and begin working on weaning in AM after volume off via CRRT.  OK to start anti-coagulation per neuro.  The patient is critically ill with multiple organ systems failure and requires high complexity decision making for assessment and support, frequent evaluation and titration of therapies, application of advanced monitoring technologies and extensive interpretation of multiple databases.   Critical Care Time devoted to patient care services described in this note is  35  Minutes. This time reflects time of care of this signee Dr Koren Bound. This critical care time does not reflect procedure time, or teaching time or supervisory time of PA/NP/Med student/Med Resident etc but could involve care discussion time.  Alyson Reedy, M.D. [facility]monary/Critical Care Medicine. Pager: [phone]. After hours pager: [phone].

## 2016-12-12 NOTE — Progress Notes (Signed)
Inpatient Diabetes Program Recommendations  AACE/ADA: New Consensus Statement on Inpatient Glycemic Control (2015)  Target Ranges:  Prepandial:   less than 140 mg/dL      Peak postprandial:   less than 180 mg/dL (1-2 hours)      Critically ill patients:  140 - 180 mg/dL   Lab Results  Component Value Date   GLUCAP 79 12/12/2016   HGBA1C 8.5 (H) 10/26/2016    Review of Glycemic Control  Diabetes history: DM2 Outpatient Diabetes medications: Lantus 20 units QHS Current orders for Inpatient glycemic control: None  Had hypoglycemia.  Inpatient Diabetes Program Recommendations:  Initiate ICU Glycemic Control Order set  Will follow closely. Thank you. Ailene Ards, RD, LDN, CDE Inpatient Diabetes Coordinator [phone]

## 2016-12-12 NOTE — Op Note (Signed)
NAMEHADDON, LANZO NO.:  [id number]  MEDICAL RECORD NO.:  [id number]  LOCATION:  D30C                         FACILITY:  [facility]  PHYSICIAN:  Antony Contras, MD     DATE OF BIRTH:  05-Jul-1961  DATE OF PROCEDURE:  12-22-16 DATE OF DISCHARGE:                              OPERATIVE REPORT   PREOPERATIVE DIAGNOSIS:  Respiratory failure.  POSTOPERATIVE DIAGNOSIS:  Respiratory failure.  PROCEDURE:  Tracheostomy.  SURGEON:  Excell Seltzer. Jenne Pane, M.D.  ANESTHESIA:  General endotracheal anesthesia.  COMPLICATIONS:  None.  INDICATION:  The patient is a 56 year old male, who was admitted to the hospital on 12/05/2016 due to seizures and a subdural hematoma and required intubation.  He has had a slow recovery and has not been able to be weaned from the ventilator.  Tracheostomy was requested.  He presents to the operating room for surgical management.  FINDINGS:  The patient has a thick short neck with laryngeal anatomy lying just in the sternal notch on examination.  The tracheostomy tube was placed between rings 2 and 3 of the trachea after pulling up in the larynx and using a proximal extended length trach tube.  DESCRIPTION OF PROCEDURE:  The patient was identified in the Intensive Care Unit and brought to the operative suite and put on the operative table in supine position.  Consent had been obtained from the wife via telephone consent.  Anesthesia was maintained via endotracheal anesthesia.  A shoulder roll was placed and the neck was exposed by pulling down the chest with tape.  The anterior neck was prepped and draped in sterile fashion.  An incision was marked with a marking pen and injected with 1% lidocaine with 1:200,000 epinephrine.  The vertical incision was made with Bovie electrocautery through the skin and an area of subcutaneous fat was removed using Bovie electrocautery.  Dissection was then performed down in the midline to the thyroid and  cricoid cartilage.  The cricoid cartilage was elevated using a cricoid hook and the midline further dissected exposing the thyroid isthmus, which was small and divided using Bovie electrocautery.  This exposed the anterior tracheal wall.  A horizontal incision was made between rings 2 and 3 on the anterior trachea using a #15 blade scalpel and extended with scissors.  A stay suture of 2-0 silk was placed around the ring above and below the trach site.  The endotracheal tube was then backed out to above the trach site and a #8 proximal extended length cuffed Shiley trach tube was then placed into the tracheostomy site using obturator without difficulty.  The obturator was removed and the inner cannula was placed.  The cuff was inflated and the anesthesia circuit was attached to the trach tube and the patient was successfully ventilated.  The stay sutures were tied with two knots above and 1 knot below the trach site. The trach flange was then sutured to the anterior neck skin using 2-0 silk suture in 4 quadrants.  The patient was then cleaned off and a trach tie and trach dressing were added.  The patient was returned to anesthesia and was then moved back to Intensive Care Unit in stable condition.  Antony Contras, MD     DDB/MEDQ  D:  12/21/2016  T:  12/12/2016  Job:  [id number]

## 2016-12-12 NOTE — Progress Notes (Signed)
[facility] Progress Note    Assessment/ Plan:    1.  Acute oliguric kidney injury: most likely ATN/ hemodynamically mediated.  We will initate CRRT for volume management and electrolyte optimization.  hep in CRRT  2.  Erythematous rash: ? Drug eruption from dilatin.  Await levels.  Dilatin moderately dialyzable  3.  Recurrent shock: most likely septic, but consider DRESS as well.  4.  Subural hematoma: followed by NSG.  Awaiting neurologic recovery    5.  Ventilator dependent respiratory failure: s/p trach  6.  Hypoglycemia: on dextrose infusion  Subjective:    Had hypoglycemic seizure overnight, now on dextrose infusion   Objective:   BP 119/88   Pulse 69   Temp 98.8 F (37.1 C) (Axillary)   Resp 14   Ht _0  (1.702 m)   Wt 127.9 kg (281 lb 15.5 oz)   SpO2 100%   BMI 44.16 kg/m   Intake/Output Summary (Last 24 hours) at 12/12/16 1405 Last data filed at 12/12/16 1300  Gross per 24 hour  Intake          1407.96 ml  Output             3149 ml  Net         -1741.04 ml   Weight change: 1 kg (2 lb 3.3 oz)  Physical Exam: GEN: sedated, doesn't follow commands HEENT: eyes closed, sclerae anicteric, s/p trach NECK: + JVD PULM: coarse bilaterally CV tachycardic, irregular ABD obese EXT 1+ upper and lower pitting edema NEURO not responsive to commands SKIN: blanching erythematous rash over arms and flanks- no skin sloughing  Imaging: US Renal  Result Date: 12/10/2016 CLINICAL DATA:  Acute renal failure, patient ventilated. EXAM: RENAL / URINARY TRACT ULTRASOUND COMPLETE COMPARISON:  CT 10/26/2016, ultrasound 10/26/2016 FINDINGS: Right Kidney: Length: 12.9 cm. Echogenicity of the kidney is increased. No mass or hydronephrosis visualized. No obstructive uropathy. Left Kidney: Length: 13.4 cm. Echogenicity of the kidney is limits. No mass or hydronephrosis visualized. No obstructive uropathy. Bladder: Foley catheter decompressed bladder. Suboptimal  assessment as a result. IMPRESSION: Increased echogenicity of both kidneys suspicious for medical renal disease. No obstructive uropathy. Bladder is decompressed by Foley catheter. Electronically Signed   By: Ashley Royalty M.D.   On: 12/10/2016 14:54   Dg Chest Port 1 View  Result Date: 12/12/2016 CLINICAL DATA:  Vent dependant Hx of Asthma, CHF, DM, HTN, COPD EXAM: PORTABLE CHEST - 1 VIEW COMPARISON:  the previous day's study FINDINGS: Endotracheal tube is been removed and tracheostomy device has been placed in expected location. Stable right IJ hemodialysis catheter. The nasogastric tube is been removed. No pneumothorax. Heart size upper limits normal for technique. Moderate central pulmonary vascular congestion and mild interstitial edema, perhaps slightly improved since previous exam. No definite effusion. Visualized bones unremarkable. IMPRESSION: 1. Tracheostomy placement without apparent complication. 2. Minimal improvement in bilateral edema/infiltrates. Electronically Signed   By: Lucrezia Europe M.D.   On: 12/12/2016 08:18   Dg Chest Portable 1 View  Result Date: 12/24/2016 CLINICAL DATA:  Right-sided central line placement EXAM: PORTABLE CHEST 1 VIEW COMPARISON:  12/09/2016 chest radiograph. FINDINGS: Endotracheal tube tip is 3.5 cm above the carina. Enteric tube enters stomach with the tip not seen on this image. Right internal jugular central venous catheter terminates in the lower third of the superior vena cava. Stable cardiomediastinal silhouette with mild cardiomegaly and aortic atherosclerosis. No pneumothorax. Stable small bilateral pleural effusions. Mild to moderate pulmonary edema appears  slightly worsened. IMPRESSION: 1. No pneumothorax. Right internal jugular central venous catheter terminates in the lower third of the superior vena cava . Additional support structures as described. 2. Mild-to-moderate congestive heart failure, slightly worsened . 3. Stable small bilateral pleural effusions.  Electronically Signed   By: Ilona Sorrel M.D.   On: 12/20/2016 11:48    Labs: BMET  Recent Labs Lab 12/06/16 0250  12/07/16 1800 12/08/16 0600 12/09/16 0545 12/09/16 1330 12/10/16 0320 12/05/2016 0318 12/10/2016 1529 12/12/16 0509  NA 154*  < > 151* 151* 147*  --  147* 145 143 142  K 3.3*  < > 3.5 3.8 4.0  --  4.4 5.5* 4.7 5.0  CL 119*  < > 117* 114* 114*  --  116* 113* 113* 109  CO2 26  < > 23 23 20*  --  15* 15* 16* 19*  GLUCOSE 206*  < > 152* 149* 178*  --  274* 278* 228* 54*  BUN 87*  < > 128* 151* 195*  --  237* 266* 232* 181*  CREATININE 1.41*  < > 2.78* 3.49* 5.00*  --  7.16* 7.66* 7.28* 6.06*  CALCIUM 8.1*  < > 7.9* 8.0* 7.7*  --  7.4* 7.5* 7.6* 7.9*  PHOS 3.5  --   --   --   --  5.3*  --  5.9* 4.8* 4.9*  < > = values in this interval not displayed. CBC  Recent Labs Lab 12/09/16 0545 12/10/16 0320 11/29/2016 0318 12/12/16 0509  WBC 7.0 6.2 6.9 7.3  HGB 12.4* 11.2* 12.4* 11.8*  HCT 39.7 36.3* 39.9 37.0*  MCV 92.5 92.1 90.9 88.9  PLT 288 269 322 339    Medications:    . aspirin  325 mg Oral Daily  . budesonide  0.5 mg Nebulization BID  . cefTRIAXone (ROCEPHIN)  IV  2 g Intravenous Q24H  . chlorhexidine gluconate (MEDLINE KIT)  15 mL Mouth Rinse BID  . diltiazem  60 mg Oral Q8H  . famotidine (PEPCID) IV  10 mg Intravenous Q24H  . feeding supplement (PRO-STAT SUGAR FREE 64)  30 mL Per Tube 5 X Daily  . free water  200 mL Per Tube Q8H  . ipratropium  0.5 mg Nebulization Q6H  . levETIRAcetam  500 mg Intravenous BID  . mouth rinse  15 mL Mouth Rinse 10 times per day  . sodium chloride flush  10-40 mL Intracatheter Q12H  . vancomycin  1,250 mg Intravenous Q24H      Madelon Lips, MD 12/12/2016, 2:05 PM

## 2016-12-12 NOTE — Progress Notes (Signed)
Pharmacy Antibiotic Note  Jeremiah Perkins is a 56 y.o. male admitted on 11/26/2016 with bacteremia.  SCr worsened from admission and now patient started on CRRT. Pt is afebrile and WBC is WNL. Repeat blood cultures negative so far.   Plan: Vancomycin 1250mg  IV Q24H (~10mg /kg/dose) F/u renal fxn, C&S, clinical status and trough at SS  Height: 5\' 7"  (170.2 cm) Weight: 281 lb 15.5 oz (127.9 kg) IBW/kg (Calculated) : 66.1  Temp (24hrs), Avg:98.5 F (36.9 C), Min:97.6 F (36.4 C), Max:99.3 F (37.4 C)   Recent Labs Lab 12/08/16 0600 12/09/16 0545 12/10/16 0320 12/04/2016 0318 11/25/2016 0750 12/09/2016 1529 12/12/16 0509  WBC 7.0 7.0 6.2 6.9  --   --  7.3  CREATININE 3.49* 5.00* 7.16* 7.66*  --  7.28* 6.06*  VANCORANDOM  --   --   --   --  20  --   --     Estimated Creatinine Clearance: 17.3 mL/min (by C-G formula based on SCr of 6.06 mg/dL (H)).    No Known Allergies   Zosyn 1/4>>1/6 Vanc 1/4>>1/5; 1/16>> CTX x 1 1/4; 1/6>>1/10; 1/17>>  1/18 VR = 20  1/4 BCx: NGTD 1/4 UCxx2: NEG 1/4 Resp PCR: neg  1/4 MRSA PCR: neg 1/15 TA - serratia s to ctx 1/15 Blood - 2/2 CoNS 1/18 Blood - NGTD  Lysle Pearl, PharmD, BCPS Pager # [phone] 12/12/2016 7:45 AM

## 2016-12-12 NOTE — Progress Notes (Signed)
eLink Physician-Brief Progress Note Patient Name: Jeremiah Perkins DOB: 1961/09/13 MRN: [id number]   Date of Service  12/12/2016  HPI/Events of Note  Seizure - Patient is already on Keppra and has an Ativan IV PRN seizures order. He appears to have broken with Ativan X 1.   eICU Interventions  Will order:  1. Please check blood glucose now.      Intervention Category Major Interventions: Seizures - evaluation and management  Sommer,Steven Eugene 12/12/2016, 3:16 AM

## 2016-12-12 NOTE — Progress Notes (Signed)
0300 Patient was found seizing. Patient's right arm and leg had heavy rhythmic jerking. 2 mg Ativan given. Symptoms relieved. CBG obtained was 62. See hypoglycemic event note. Dr. Arsenio Loader at [facility] notified and new orders received to add dextrose to IVF

## 2016-12-12 NOTE — Progress Notes (Signed)
eLink Physician-Brief Progress Note Patient Name: Jeremiah Perkins DOB: 03-27-1961 MRN: [id number]   Date of Service  12/12/2016  HPI/Events of Note  AFIB - Cardizem PO now held. HR = 100's to 130's.  eICU Interventions  Will order: 1. Will change Metoprolol order to give for HR > 115 or SBP > 160.     Intervention Category Major Interventions: Arrhythmia - evaluation and management  Cartina Brousseau Eugene 12/12/2016, 4:32 AM

## 2016-12-12 NOTE — Progress Notes (Signed)
  PHARMACY - PHYSICIAN COMMUNICATION CRITICAL VALUE ALERT - BLOOD CULTURE IDENTIFICATION (BCID)  Results for orders placed or performed during the hospital encounter of 12/18/2016  Blood Culture ID Panel (Reflexed) (Collected: 12/10/2016 10:14 AM)  Result Value Ref Range   Enterococcus species NOT DETECTED NOT DETECTED   Listeria monocytogenes NOT DETECTED NOT DETECTED   Staphylococcus species DETECTED (A) NOT DETECTED   Staphylococcus aureus NOT DETECTED NOT DETECTED   Methicillin resistance DETECTED (A) NOT DETECTED   Streptococcus species NOT DETECTED NOT DETECTED   Streptococcus agalactiae NOT DETECTED NOT DETECTED   Streptococcus pneumoniae NOT DETECTED NOT DETECTED   Streptococcus pyogenes NOT DETECTED NOT DETECTED   Acinetobacter baumannii NOT DETECTED NOT DETECTED   Enterobacteriaceae species NOT DETECTED NOT DETECTED   Enterobacter cloacae complex NOT DETECTED NOT DETECTED   Escherichia coli NOT DETECTED NOT DETECTED   Klebsiella oxytoca NOT DETECTED NOT DETECTED   Klebsiella pneumoniae NOT DETECTED NOT DETECTED   Proteus species NOT DETECTED NOT DETECTED   Serratia marcescens NOT DETECTED NOT DETECTED   Haemophilus influenzae NOT DETECTED NOT DETECTED   Neisseria meningitidis NOT DETECTED NOT DETECTED   Pseudomonas aeruginosa NOT DETECTED NOT DETECTED   Candida albicans NOT DETECTED NOT DETECTED   Candida glabrata NOT DETECTED NOT DETECTED   Candida krusei NOT DETECTED NOT DETECTED   Candida parapsilosis NOT DETECTED NOT DETECTED   Candida tropicalis NOT DETECTED NOT DETECTED    Name of physician (or Provider) Contacted: Claudette Head  Repeat cx's drawn on 1/18 are now 1/2 with GPC in clusters. BCID detected staph species with mec A gene. Had 2/2 blood cx's with staph epidermidis growing on 1/15. Could be same staph epidermidis from the previous cx's. TA cx also grew serratia on 1/15 which they are on ceftriaxone for.   Changes to prescribed antibiotics required: Continue  vancomycin and ceftriaxone for staph epidermidis bacteremia and serratia PNA. May need to consider pulling line / drain again. Follow up susceptibilities and repeat blood cx's for clearance  Enzo Bi, PharmD, BCPS Clinical Pharmacist Pager [phone] 12/12/2016 8:57 AM

## 2016-12-13 LAB — RENAL FUNCTION PANEL
ALBUMIN: 1.1 g/dL — AB (ref 3.5–5.0)
ALBUMIN: 1.1 g/dL — AB (ref 3.5–5.0)
ANION GAP: 10 (ref 5–15)
Anion gap: 11 (ref 5–15)
BUN: 83 mg/dL — AB (ref 6–20)
BUN: 65 mg/dL — AB (ref 6–20)
CALCIUM: 7.5 mg/dL — AB (ref 8.9–10.3)
CO2: 22 mmol/L (ref 22–32)
CO2: 23 mmol/L (ref 22–32)
CREATININE: 2.98 mg/dL — AB (ref 0.61–1.24)
Calcium: 7.3 mg/dL — ABNORMAL LOW (ref 8.9–10.3)
Chloride: 103 mmol/L (ref 101–111)
Chloride: 101 mmol/L (ref 101–111)
Creatinine, Ser: 2.6 mg/dL — ABNORMAL HIGH (ref 0.61–1.24)
GFR calc Af Amer: 30 mL/min — ABNORMAL LOW (ref >60)
GFR calc non Af Amer: 26 mL/min — ABNORMAL LOW (ref >60)
GFR, EST AFRICAN AMERICAN: 25 mL/min — AB (ref >60)
GFR, EST NON AFRICAN AMERICAN: 22 mL/min — AB (ref >60)
GLUCOSE: 228 mg/dL — AB (ref 65–99)
Glucose, Bld: 187 mg/dL — ABNORMAL HIGH (ref 65–99)
PHOSPHORUS: 3.9 mg/dL (ref 2.5–4.6)
PHOSPHORUS: 3.2 mg/dL (ref 2.5–4.6)
Potassium: 4.2 mmol/L (ref 3.5–5.1)
Potassium: 4.1 mmol/L (ref 3.5–5.1)
SODIUM: 136 mmol/L (ref 135–145)
Sodium: 134 mmol/L — ABNORMAL LOW (ref 135–145)

## 2016-12-13 LAB — POCT ACTIVATED CLOTTING TIME
ACTIVATED CLOTTING TIME: 180 s
ACTIVATED CLOTTING TIME: 180 s
ACTIVATED CLOTTING TIME: 180 s
ACTIVATED CLOTTING TIME: 180 s
ACTIVATED CLOTTING TIME: 180 s
ACTIVATED CLOTTING TIME: 191 s
ACTIVATED CLOTTING TIME: 191 s
ACTIVATED CLOTTING TIME: 191 s
Activated Clotting Time: 180 seconds
Activated Clotting Time: 180 seconds
Activated Clotting Time: 186 seconds
Activated Clotting Time: 191 seconds
Activated Clotting Time: 191 seconds
Activated Clotting Time: 191 seconds

## 2016-12-13 LAB — CBC
HCT: 34.6 % — ABNORMAL LOW (ref 39.0–52.0)
Hemoglobin: 11.1 g/dL — ABNORMAL LOW (ref 13.0–17.0)
MCH: 28.2 pg (ref 26.0–34.0)
MCHC: 32.1 g/dL (ref 30.0–36.0)
MCV: 87.8 fL (ref 78.0–100.0)
Platelets: 235 10*3/uL (ref 150–400)
RBC: 3.94 MIL/uL — ABNORMAL LOW (ref 4.22–5.81)
RDW: 17.9 % — AB (ref 11.5–15.5)
WBC: 6.6 10*3/uL (ref 4.0–10.5)

## 2016-12-13 LAB — HEPATIC FUNCTION PANEL
ALK PHOS: 132 U/L — AB (ref 38–126)
ALT: 37 U/L (ref 17–63)
AST: 89 U/L — AB (ref 15–41)
Albumin: 1.1 g/dL — ABNORMAL LOW (ref 3.5–5.0)
BILIRUBIN INDIRECT: 0.7 mg/dL (ref 0.3–0.9)
Bilirubin, Direct: 0.2 mg/dL (ref 0.1–0.5)
Total Bilirubin: 0.9 mg/dL (ref 0.3–1.2)
Total Protein: 5.5 g/dL — ABNORMAL LOW (ref 6.5–8.1)

## 2016-12-13 LAB — GLUCOSE, CAPILLARY
GLUCOSE-CAPILLARY: 187 mg/dL — AB (ref 65–99)
GLUCOSE-CAPILLARY: 197 mg/dL — AB (ref 65–99)
Glucose-Capillary: 167 mg/dL — ABNORMAL HIGH (ref 65–99)
Glucose-Capillary: 169 mg/dL — ABNORMAL HIGH (ref 65–99)
Glucose-Capillary: 185 mg/dL — ABNORMAL HIGH (ref 65–99)

## 2016-12-13 LAB — AMMONIA: Ammonia: 53 umol/L — ABNORMAL HIGH (ref 9–35)

## 2016-12-13 LAB — APTT: APTT: 64 s — AB (ref 24–36)

## 2016-12-13 LAB — MAGNESIUM: MAGNESIUM: 2.5 mg/dL — AB (ref 1.7–2.4)

## 2016-12-13 MED ORDER — INSULIN ASPART 100 UNIT/ML ~~LOC~~ SOLN
1.0000 [IU] | SUBCUTANEOUS | Status: DC
  Administered 2016-12-13 (×2): 2 [IU] via SUBCUTANEOUS

## 2016-12-13 NOTE — Progress Notes (Signed)
PULMONARY / CRITICAL CARE MEDICINE   Name: Jeremiah Perkins MRN: [id number] DOB: Sep 21, 1961    ADMISSION DATE:  2016-11-29 CONSULTATION DATE:  2016-11-29  REFERRING MD:  Dr. Allena Katz   CHIEF COMPLAINT: SDH   Brief:   56 year old male with PMH of DM, COPD, CHF, OSA (on CPAP at HS), atrial flutter (on xarelto), and polysubstance abuse (patient report used cocaine night before admit). Present to ED on 1/3 with new SDH and seizures. Transferred to ICU and ultimately required intubation. Has been slow to improve since that time and remains on ventilator. No further seizures.   SUBJECTIVE:  No acute change overnight.  On CVVHD.  Minimally responsive off sedation (sedation remains for vent synchrony).   VITAL SIGNS: BP 122/67   Pulse (!) 59   Temp 97.5 F (36.4 C) (Axillary)   Resp (!) 26   Ht 5\' 7"  (1.702 m)   Wt 125.9 kg (277 lb 9 oz)   SpO2 100%   BMI 43.47 kg/m   HEMODYNAMICS:    VENTILATOR SETTINGS: Vent Mode: PRVC FiO2 (%):  [30 %-40 %] 30 % Set Rate:  [18 bmp] 18 bmp Vt Set:  [530 mL] 530 mL PEEP:  [5 cmH20] 5 cmH20 Plateau Pressure:  [17 cmH20] 17 cmH20  INTAKE / OUTPUT: I/O last 3 completed shifts: In: 4010 [I.V.:1840; Other:25; NG/GT:1480; IV Piggyback:665] Out: 6032 [Urine:22; Other:5910; Stool:100]  PHYSICAL EXAMINATION:  General:  Obese male, NAD   Neuro:  Sedated on vent, no response off sedation per RN, easily tachypneic/dyssynchronous with vent so sedation back on  HEENT:  Trach c/d  Cardiovascular: mild tachy  Lungs:  resps even, tachypneic on vent, diminished  Abdomen:  Soft, non-distended, active bowel sounds  Musculoskeletal:  No acute deformity Skin:  Warm and dry, generalized edema, erythematous rash   LABS:  BMET  Recent Labs Lab 12/12/16 0509 12/12/16 1700 12/13/16 0520  NA 142 137 136  K 5.0 4.3 4.2  CL 109 105 103  CO2 19* 22 22  BUN 181* 117* 83*  CREATININE 6.06* 4.11* 2.98*  GLUCOSE 54* 184* 187*   Electrolytes  Recent  Labs Lab 11/28/2016 0318  12/12/16 0509 12/12/16 1700 12/13/16 0520  CALCIUM 7.5*  < > 7.9* 7.6* 7.5*  MG 2.9*  --  2.6*  --  2.5*  PHOS 5.9*  < > 4.9* 4.1 3.9  < > = values in this interval not displayed.  CBC  Recent Labs Lab 11/24/2016 0318 12/12/16 0509 12/13/16 0520  WBC 6.9 7.3 6.6  HGB 12.4* 11.8* 11.1*  HCT 39.9 37.0* 34.6*  PLT 322 339 235   Coag's  Recent Labs Lab 12/12/16 0509 12/13/16 0520  APTT 41* 64*    Sepsis Markers No results for input(s): LATICACIDVEN, PROCALCITON, O2SATVEN in the last 168 hours.  ABG No results for input(s): PHART, PCO2ART, PO2ART in the last 168 hours.  Liver Enzymes  Recent Labs Lab 12/10/16 0320 12/06/2016 1011  12/12/16 0509 12/12/16 1700 12/13/16 0520  AST 84* 69*  --   --   --   --   ALT 29 32  --   --   --   --   ALKPHOS 80  --   --   --   --   --   BILITOT 0.7 0.6  --   --   --   --   ALBUMIN 1.2*  --   < > 1.2* 1.1* 1.1*  < > = values in this interval not  displayed. Cardiac Enzymes No results for input(s): TROPONINI, PROBNP in the last 168 hours.  Glucose  Recent Labs Lab 12/12/16 1305 12/12/16 1547 12/12/16 1932 12/12/16 2319 12/13/16 0342 12/13/16 0747  GLUCAP 79 94 140* 185* 167* 169*   Imaging Dg Abd Portable 1v  Result Date: 12/12/2016 CLINICAL DATA:  New NG tube for medications, brain injury. EXAM: PORTABLE ABDOMEN - 1 VIEW COMPARISON:  None. FINDINGS: Nasogastric tube is in the stomach with tip at or just proximal to the stomach pylorus/duodenal bulb. Paucity of bowel gas. IMPRESSION: Nasogastric tube in the stomach with tip at the expected level of the stomach pylorus or duodenal bulb. Electronically Signed   By: Bary Richard M.D.   On: 12/12/2016 14:16    STUDIES:  Echo Jan 2017 > LVEF 35-40% with regional wall motion abnormalities,akinesis of the anteroseptal and apical myocardium CT Head 1/4 > large falcine subdural hematoma measuring up to 19 mm thick unchanged, Minimal subarachnoid  hemorrhage at sulci along the superior aspect of the interhemispheric fissure. No intracranial mass, intracerebral hemorrhage or evidence of acute infarction CXR 1/4 > Interstitial and alveolar pulmonary edema pattern without definite pleural effusions EEG 1/5 > Diffuse slowing of background, no seizures seen  EEG 1/6 > generalized polymorphic delta slowing, suggesting severe encephalopathy, no seizures  MRI Head 1/7 > Stable parafalcine and left tentorium subdural hematoma as well as parafalcine subarachnoid hemorrhage Echo 1/20 > 35-40%, reduced systolic function  EEG 1/18>>> This normal EEG is consistent with a moderate to severe generalized non-specific cerebral dysfunction(encephalopathy)  CULTURES: UA 1/4 > Neg Urine 1/4 > Neg Blood 1/4 > Neg  Flu 1/4 > Neg BC 1/15 > Staph in aerobic bottle MRSA by reflex panel >neg Urine 1/15 > neg  Trach asp 1/15 > Serratia Marcesens  Blood 1/18 > GPC>>>  ANTIBIOTICS: Rocephin 1/4 >1/4  1/6 > Vancomycin 1/4 >1/7 Zosyn 1/4 > 1/7 Vanocmycin 1/16 >>>  Rocephin 1/17 >>   SIGNIFICANT EVENTS: 1/4 > Presents to ED with weakness and headache  1/5 > Intubated  1/18 > Trach via Dr. Jenne Pane   LINES/TUBES: ETT 1/4 >>  RHD 1/18 >>  R PICC 1/13 > 1/16  ASSESSMENT / PLAN:  PULMONARY A: Acute on Chronic Respiratory Failure  H/O COPD, OSA (on CPAP at HS)  Pulm edema-worse aeration 1/14  P:   Vent support - 8cc/kg  F/u CXR  F/u ABG BD's    CARDIOVASCULAR A: Hypotension   CHF  H/O Afib/flutter (was on Xarelto)- now rate controlled. HTN  P: PRN metoprolol  Continue per tube cardizem  Consider resume home xarelto v heparin gtt -- getting heparin with CVVHD for now   RENAL A:   AKI  Hypokalemia - improved  Hypernatremia - resolved.  Hypocalcemia  P:  CVVHD per renal  Pulling 194ml/hr for now  D/c free water  F/u chem    GASTROINTESTINAL A:   GERD P:   NPO  Pepcid  TF    HEMATOLOGIC A:  On Chronic Anticoagulation  (FFP administered in ED)  P:  SCD's  Consider resume xarelto in next 24-48 hours  F/u CBC    INFECTIOUS A:   Bacteremia, MRSA CAP  -PICC removed 1/17  P:   abx as above - vanc, rocephin   ENDOCRINE A:   Hypoglycemia  DM, controlled P:   Continue hold insulin   NEUROLOGIC A:   Dilantin toxicity  Interhemispheric SDH  -recent falls and weakness to right lower extremity  R side weakness  AMS - seems to be worsening per RN, minimally responsive even off sedation  P:   Continue propofol gtt for vent synchrony  PRN fentanyl  keppra  May need to ask neuro to re-visit  Trend dilantin levels  ?repeat imaging  Check ammonia  RASS goal: 0  FAMILY  - Updates: no family available 1/20 - family meeting planned Monday with Dr. Jamison Neighbor and Palliative care.    - Inter-disciplinary family meet or Palliative Care meeting on-going.    Dirk Dress, NP 12/13/2016  11:03 AM Pager: [phone] or [phone]

## 2016-12-13 NOTE — Progress Notes (Signed)
Jeremiah Perkins Progress Note    Assessment/ Plan:    1.  Acute oliguric kidney injury: most likely ATN/ hemodynamically mediated.  on CRRT, no signs of renal recovery yet.  2.  Erythematous rash: ? Drug eruption from dilatin.  Await levels.  Dilatin moderately dialyzable- rash getting better  3.  Recurrent shock: most likely septic, but consider DRESS as well.  4.  Subural hematoma: followed by NSG.  Awaiting neurologic recovery    5.  Ventilator dependent respiratory failure: s/p trach  6.  Hypoglycemia: on dextrose infusion  Subjective:    No further seizures, rash getting better   Objective:   BP 122/67   Pulse (!) 59   Temp 97.5 F (36.4 C) (Axillary)   Resp (!) 26   Ht '5\' 7"'  (1.702 m)   Wt 125.9 kg (277 lb 9 oz)   SpO2 100%   BMI 43.47 kg/m   Intake/Output Summary (Last 24 hours) at 12/13/16 1039 Last data filed at 12/13/16 1000  Gross per 24 hour  Intake           3629.8 ml  Output             4789 ml  Net          -1159.2 ml   Weight change: -2 kg (-4 lb 6.5 oz)  Physical Exam: GEN: sedated, doesn't follow commands HEENT: eyes closed, sclerae anicteric, s/p trach NECK: + JVD PULM: coarse bilaterally CV tachycardic, irregular ABD obese EXT 1+ upper and lower pitting edema, improving NEURO not responsive to commands SKIN: blanching erythematous rash over arms and flanks, improving- no skin sloughing  Imaging: Dg Chest Port 1 View  Result Date: 12/12/2016 CLINICAL DATA:  Vent dependant Hx of Asthma, CHF, DM, HTN, COPD EXAM: PORTABLE CHEST - 1 VIEW COMPARISON:  the previous day's study FINDINGS: Endotracheal tube is been removed and tracheostomy device has been placed in expected location. Stable right IJ hemodialysis catheter. The nasogastric tube is been removed. No pneumothorax. Heart size upper limits normal for technique. Moderate central pulmonary vascular congestion and mild interstitial edema, perhaps slightly improved since  previous exam. No definite effusion. Visualized bones unremarkable. IMPRESSION: 1. Tracheostomy placement without apparent complication. 2. Minimal improvement in bilateral edema/infiltrates. Electronically Signed   By: Lucrezia Europe M.D.   On: 12/12/2016 08:18   Dg Chest Portable 1 View  Result Date: 12/22/2016 CLINICAL DATA:  Right-sided central line placement EXAM: PORTABLE CHEST 1 VIEW COMPARISON:  12/09/2016 chest radiograph. FINDINGS: Endotracheal tube tip is 3.5 cm above the carina. Enteric tube enters stomach with the tip not seen on this image. Right internal jugular central venous catheter terminates in the lower third of the superior vena cava. Stable cardiomediastinal silhouette with mild cardiomegaly and aortic atherosclerosis. No pneumothorax. Stable small bilateral pleural effusions. Mild to moderate pulmonary edema appears slightly worsened. IMPRESSION: 1. No pneumothorax. Right internal jugular central venous catheter terminates in the lower third of the superior vena cava . Additional support structures as described. 2. Mild-to-moderate congestive heart failure, slightly worsened . 3. Stable small bilateral pleural effusions. Electronically Signed   By: Ilona Sorrel M.D.   On: 11/30/2016 11:48   Dg Abd Portable 1v  Result Date: 12/12/2016 CLINICAL DATA:  New NG tube for medications, brain injury. EXAM: PORTABLE ABDOMEN - 1 VIEW COMPARISON:  None. FINDINGS: Nasogastric tube is in the stomach with tip at or just proximal to the stomach pylorus/duodenal bulb. Paucity of bowel gas. IMPRESSION: Nasogastric tube in  the stomach with tip at the expected level of the stomach pylorus or duodenal bulb. Electronically Signed   By: Franki Cabot M.D.   On: 12/12/2016 14:16    Labs: BMET  Recent Labs Lab 12/09/16 0545 12/09/16 1330 12/10/16 0320 12/19/2016 0318 11/28/2016 1529 12/12/16 0509 12/12/16 1700 12/13/16 0520  NA 147*  --  147* 145 143 142 137 136  K 4.0  --  4.4 5.5* 4.7 5.0 4.3 4.2   CL 114*  --  116* 113* 113* 109 105 103  CO2 20*  --  15* 15* 16* 19* 22 22  GLUCOSE 178*  --  274* 278* 228* 54* 184* 187*  BUN 195*  --  237* 266* 232* 181* 117* 83*  CREATININE 5.00*  --  7.16* 7.66* 7.28* 6.06* 4.11* 2.98*  CALCIUM 7.7*  --  7.4* 7.5* 7.6* 7.9* 7.6* 7.5*  PHOS  --  5.3*  --  5.9* 4.8* 4.9* 4.1 3.9   CBC  Recent Labs Lab 12/10/16 0320 12/07/2016 0318 12/12/16 0509 12/13/16 0520  WBC 6.2 6.9 7.3 6.6  HGB 11.2* 12.4* 11.8* 11.1*  HCT 36.3* 39.9 37.0* 34.6*  MCV 92.1 90.9 88.9 87.8  PLT 269 322 339 235    Medications:    . aspirin  325 mg Oral Daily  . budesonide  0.5 mg Nebulization BID  . cefTRIAXone (ROCEPHIN)  IV  2 g Intravenous Q24H  . chlorhexidine gluconate (MEDLINE KIT)  15 mL Mouth Rinse BID  . diltiazem  60 mg Oral Q8H  . famotidine (PEPCID) IV  10 mg Intravenous Q24H  . feeding supplement (PRO-STAT SUGAR FREE 64)  30 mL Per Tube 5 X Daily  . free water  200 mL Per Tube Q8H  . ipratropium  0.5 mg Nebulization Q6H  . levETIRAcetam  500 mg Intravenous BID  . mouth rinse  15 mL Mouth Rinse 10 times per day  . sodium chloride flush  10-40 mL Intracatheter Q12H  . vancomycin  1,250 mg Intravenous Q24H      Madelon Lips, MD 12/13/2016, 10:39 AM

## 2016-12-13 NOTE — Progress Notes (Signed)
Palliative Care  I was able to speak with Mr. Gasque wife over the phone. She was happy to talk with me and related that a family meeting had already been planned for Monday (1/22) at 10:30. She couldn't recall the person who set this up, but thought it was a Engineer, civil (consulting). Her understanding was that during that meeting she would be able to discuss her husband's health issues, and plan for what comes next in his care. I did ask if she wanted to meet earlier, but she preferred to wait until the planned time on Monday.   I will plan to attend this meeting and facilitate a goals of care conversation.  No charge note.   Murrell Converse [facility] Palliative [facility] [phone] (cell, 7a-4:30p) [phone] (group number for after hours and on weekends)

## 2016-12-13 NOTE — Consult Note (Signed)
Consultation Note Date: 12/13/2016   Patient Name: Jeremiah Perkins  DOB: 1961-06-12  MRN: [id number]  Age / Sex: 56 y.o., male  PCP: [name], DO Referring Physician: Rush Landmark, MD  Reason for Consultation: Establishing goals of care  HPI/Patient Profile: 56 y.o. male  with past medical history of DM with diabetic peripheral neuropathy, COPD, CHF, OSA (on CPAP), atrial flutter (on xarelto), Depression, anxiety, and polysubstance abuse who presented to the ED c/o right leg weakness, multiple falls, and headache and was admitted on 12/04/2016 with a large subdural hematoma and seizure (witnessed in Ed). He became hypotensive and febrile, with antibiotics initiated for possible sepsis. He ultimately required intubation on 1/5 for airway protection, and has remained on ventilator. He then again became hypotensive and febrile, with MRSA bacteremia identified. His kidney function worsened, likely due to hemodynamically mediated ATN, and CRRT initiated on 1/18 for volume management and electrolyte optimization.   Clinical Assessment and Goals of Care: Jeremiah Perkins remains on the ventilator and is not able to meaningfully participate in conversations about goals of care. A family meeting was planned for today. In attendance were his wife, daughter, niece, Dr. Ashok Cordia with critical care, care nurse, [place], ICU case manager, and myself.    Dr. Ashok Cordia reviewed his medical presentation, discussing what his major issues were and how they were being managed. Additionally, he explained that Jeremiah Perkins seemed to be worsening overall, with ongoing infection (unclear on source), no improvement in his renal function, and continued non-responsiveness. His family, specifically his niece (who is a Marine scientist) asked multiple good questions that explored his prognosis and potential for meaningful recovery. It was well communicated that the likelihood of  meaningful recovery became increasingly less likely as he continues to require ICU level care for his multi-organ dysfunction/failure. Dr. Ashok Cordia did review the two paths moving forward, which included continuing aggressive interventions in an effort to prolong life, versus focusing on comfort. These paths were discussed in detail, and included information on types of interventions and timeframe for these.   In this conversation his wife was able to verbalize that her husband would not want to remain in this state of non-responsiveness and dependent on life support.The patient's niece and daughter also echoed this sentiment. That said, she feels strongly that she needs to process this information and talk with family (including the couples two sons). Her daughter planned to contact their pastor for additional support.   Primary Decision Maker Pt's wife.    SUMMARY OF RECOMMENDATIONS    Full code, full scope interventions  Family is processing information and need to discuss options  I provided my direct number in case they have any follow-up questions or need additional family meetings. I will plan to follow-up with Jeremiah Perkins to offer support in decision making  Code Status/Advance Care Planning:  Full code  Additional Recommendations (Limitations, Scope, Preferences):  Full Scope Treatment  Psycho-social/Spiritual:   Desire for further Chaplaincy support:no; family to contact their own pastor  Additional Recommendations: TBD  Prognosis:   Unable to determine  Discharge Planning: To Be Determined      Primary Diagnoses: Present on Admission: . Subdural hematoma ([place]) . Chronic systolic heart failure ([place]) . Atrial flutter ([place]) . Diabetes mellitus type 2, controlled, with complications ([place]) . Fever   I have reviewed the medical record, interviewed the patient and family, and examined the patient. The following aspects are pertinent.  Past Medical History:    Diagnosis  Date  . Anxiety   . Arthritis    involving knees, ankles, back, elbows (01/02/2015)  . Asthma   . Atrial flutter ([place])   . Cholelithiasis   . Chronic back pain   . Chronic bronchitis ([place])    "get it q yr" (01/02/2015)  . Congestive heart failure ([place])    No echo data available. Myocardial perfusion 2005 with EF 61%, presumed diastolic HF   . COPD (chronic obstructive pulmonary disease) ([place])   . Diabetic peripheral neuropathy ([place])   . Diverticulosis    with history of diverticulitis in 10/2011  . GERD (gastroesophageal reflux disease)   . Gout   . Hyperlipidemia   . Hypertension   . Major depression   . Morbid obesity ([place])   . Myocardial infarction    "one doctor said I did back in 2014"  . OSA (obstructive sleep apnea) 1998   "wear my CPAP a couple times/month" (01/02/2015)  . Pneumonia 2013; 2014  . Psychosexual dysfunction with inhibited sexual excitement   . Shortness of breath    "all the time" (07/26/2013)  . Type II diabetes mellitus ([place]) dx'd 1991   previously on insulin in 2000 for 3-4 years (but was stopped because adamantly did not want to be on insulin)   Social History   Social History  . Marital status: Married    Spouse name: N/A  . Number of children: 3  . Years of education: 14   Occupational History  . Now unemployed     Conservation officer, nature at [employer]  .  [employer]   Social History Main Topics  . Smoking status: Former Smoker    Packs/day: 0.12    Years: 10.00    Types: Cigarettes    Quit date: 09/13/2015  . Smokeless tobacco: Never Used     Comment: rarely.  . Alcohol use 0.0 oz/week     Comment: OCCASIONAL  . Drug use: Yes    Types: Marijuana, Cocaine     Comment: Toxicology report= positive cocaine, THC  . Sexual activity: Yes   Other Topics Concern  . None   Social History Narrative   Previously worked as Conservation officer, nature and lives at home with his wife. They each have children but none between the two of them.      Diet:       Do you drink/ eat things with caffeine?  Yes      Marital status:   Married                             What year were you married ? 2010      Do you live in a house, apartment,assistred living, condo, trailer, etc.)? House      Is it one or more stories? Yes      How many persons live in your home ? 3      Do you have any pets in your home ?(please list) Dog      Current or past profession: Aircraft Mechanic/Instructor      Do you exercise?  Yes                            Type & how often:  Golf,cut grass, walk      Do you have a living will? No      Do you have a DNR form?   No  If not, do you want to discuss one?       Do you have signed POA?HPOA forms?   No              If so, please bring to your        appointment         Family History  Problem Relation Age of Onset  . Diabetes Mother   . CAD Mother 37    requiring quadruple bypass  . Hypertension Mother   . Alzheimer's disease Mother   . Stomach cancer Father   . Lung cancer Father     was a smoker  . Gout Father   . CAD Brother 19    requiring quadruple bypass  . Seizures Brother   . Hypertension Brother   . Diabetes Maternal Grandmother   . Alzheimer's disease Maternal Grandmother   . Breast cancer Paternal Aunt   . Diabetes Maternal Aunt   . Seizures Paternal Uncle    Scheduled Meds: . aspirin  325 mg Oral Daily  . budesonide  0.5 mg Nebulization BID  . cefTRIAXone (ROCEPHIN)  IV  2 g Intravenous Q24H  . chlorhexidine gluconate (MEDLINE KIT)  15 mL Mouth Rinse BID  . diltiazem  60 mg Oral Q8H  . famotidine (PEPCID) IV  10 mg Intravenous Q24H  . feeding supplement (PRO-STAT SUGAR FREE 64)  30 mL Per Tube 5 X Daily  . free water  200 mL Per Tube Q8H  . ipratropium  0.5 mg Nebulization Q6H  . levETIRAcetam  500 mg Intravenous BID  . mouth rinse  15 mL Mouth Rinse 10 times per day  . sodium chloride flush  10-40 mL Intracatheter Q12H  . vancomycin  1,250 mg Intravenous Q24H    Continuous Infusions: . feeding supplement (VITAL HIGH PROTEIN) 1,000 mL (12/12/16 1400)  . fentaNYL infusion INTRAVENOUS Stopped (12/12/16 1900)  . heparin 10,000 units/ 20 mL infusion syringe 1,950 Units/hr (12/13/16 0706)  . phenylephrine (NEO-SYNEPHRINE) Adult infusion Stopped (12/08/2016 1535)  . dialysis replacement fluid (prismasate) 400 mL/hr at 12/13/16 0252  . dialysis replacement fluid (prismasate) 500 mL/hr at 12/12/16 2132  . dialysate (PRISMASATE) 2,000 mL/hr at 12/13/16 0514  . propofol (DIPRIVAN) infusion 20 mcg/kg/min (12/13/16 0347)   PRN Meds:.sodium chloride, acetaminophen (TYLENOL) oral liquid 160 mg/5 mL, fentaNYL (SUBLIMAZE) injection, heparin, heparin, levalbuterol, loperamide, LORazepam, metoprolol, ondansetron **OR** ondansetron (ZOFRAN) IV, sodium chloride, sodium chloride flush No Known Allergies   Review of Systems: Unable to obtain, pt unresponsive.  Physical Exam  Obese man on ventilator with sedation. Trach in place with foul smell eminating. Unresponsive to painful stimuli. Tachypnea noted. Trace edema in BLE.  Vital Signs: BP (!) 143/66   Pulse 62   Temp 97.6 F (36.4 C) (Axillary)   Resp (!) 26   Ht '5\' 7"'  (1.702 m)   Wt 125.9 kg (277 lb 9 oz)   SpO2 100%   BMI 43.47 kg/m  Pain Assessment: CPOT   Pain Score: 7    SpO2: SpO2: 100 % O2 Device:SpO2: 100 % O2 Flow Rate: .O2 Flow Rate (L/min): 50 L/min  IO: Intake/output summary:  Intake/Output Summary (Last 24 hours) at 12/13/16 0744 Last data filed at 12/13/16 0700  Gross per 24 hour  Intake             3496 ml  Output             4896 ml  Net            -  1400 ml    LBM: Last BM Date: 12/12/16 Baseline Weight: Weight: 120.2 kg (265 lb) Most recent weight: Weight: 125.9 kg (277 lb 9 oz)      Flowsheet Rows   Flowsheet Row Most Recent Value  Intake Tab  Referral Department  Critical care  Unit at Time of Referral  ICU  Palliative Care Primary Diagnosis  Neurology  Date Notified   12/12/16  Palliative Care Type  New Palliative care  Reason for referral  Clarify Goals of Care  Date of Admission  11/28/2016  # of days IP prior to Palliative referral  15  Clinical Assessment  Psychosocial & Spiritual Assessment  Palliative Care Outcomes      Time Total: 70 minutes Greater than 50%  of this time was spent counseling and coordinating care related to the above assessment and plan.  Signed by: Charlynn Court, NP Palliative Medicine Team Pager # [phone] (M-F 7a-5p) Team Phone # [phone] (Nights/Weekends)

## 2016-12-13 NOTE — Progress Notes (Addendum)
65 ml of Fentanyl wasted in sink witnessed by Andre Lefort, RN. Unused 250 ml bag of Fentanyl returned to [facility] in Pharmacy.

## 2016-12-14 LAB — RENAL FUNCTION PANEL
ALBUMIN: 1.1 g/dL — AB (ref 3.5–5.0)
ALBUMIN: 1.1 g/dL — AB (ref 3.5–5.0)
ANION GAP: 11 (ref 5–15)
Anion gap: 9 (ref 5–15)
BUN: 53 mg/dL — ABNORMAL HIGH (ref 6–20)
BUN: 40 mg/dL — AB (ref 6–20)
CALCIUM: 7.4 mg/dL — AB (ref 8.9–10.3)
CHLORIDE: 101 mmol/L (ref 101–111)
CO2: 25 mmol/L (ref 22–32)
CO2: 24 mmol/L (ref 22–32)
CREATININE: 2.15 mg/dL — AB (ref 0.61–1.24)
CREATININE: 1.72 mg/dL — AB (ref 0.61–1.24)
Calcium: 7.2 mg/dL — ABNORMAL LOW (ref 8.9–10.3)
Chloride: 100 mmol/L — ABNORMAL LOW (ref 101–111)
GFR calc Af Amer: 49 mL/min — ABNORMAL LOW (ref >60)
GFR calc non Af Amer: 42 mL/min — ABNORMAL LOW (ref >60)
GFR, EST AFRICAN AMERICAN: 37 mL/min — AB (ref >60)
GFR, EST NON AFRICAN AMERICAN: 32 mL/min — AB (ref >60)
GLUCOSE: 225 mg/dL — AB (ref 65–99)
Glucose, Bld: 232 mg/dL — ABNORMAL HIGH (ref 65–99)
PHOSPHORUS: 2.7 mg/dL (ref 2.5–4.6)
Phosphorus: 2.4 mg/dL — ABNORMAL LOW (ref 2.5–4.6)
Potassium: 4 mmol/L (ref 3.5–5.1)
Potassium: 3.9 mmol/L (ref 3.5–5.1)
SODIUM: 135 mmol/L (ref 135–145)
Sodium: 135 mmol/L (ref 135–145)

## 2016-12-14 LAB — CBC
HCT: 32.7 % — ABNORMAL LOW (ref 39.0–52.0)
HEMOGLOBIN: 10.5 g/dL — AB (ref 13.0–17.0)
MCH: 27.9 pg (ref 26.0–34.0)
MCHC: 32.1 g/dL (ref 30.0–36.0)
MCV: 87 fL (ref 78.0–100.0)
PLATELETS: 264 10*3/uL (ref 150–400)
RBC: 3.76 MIL/uL — ABNORMAL LOW (ref 4.22–5.81)
RDW: 17.7 % — AB (ref 11.5–15.5)
WBC: 6.2 10*3/uL (ref 4.0–10.5)

## 2016-12-14 LAB — MAGNESIUM: MAGNESIUM: 2.6 mg/dL — AB (ref 1.7–2.4)

## 2016-12-14 LAB — GLUCOSE, CAPILLARY
GLUCOSE-CAPILLARY: 199 mg/dL — AB (ref 65–99)
GLUCOSE-CAPILLARY: 197 mg/dL — AB (ref 65–99)
GLUCOSE-CAPILLARY: 205 mg/dL — AB (ref 65–99)
Glucose-Capillary: 196 mg/dL — ABNORMAL HIGH (ref 65–99)
Glucose-Capillary: 211 mg/dL — ABNORMAL HIGH (ref 65–99)
Glucose-Capillary: 246 mg/dL — ABNORMAL HIGH (ref 65–99)
Glucose-Capillary: 253 mg/dL — ABNORMAL HIGH (ref 65–99)

## 2016-12-14 LAB — POCT ACTIVATED CLOTTING TIME
ACTIVATED CLOTTING TIME: 197 s
ACTIVATED CLOTTING TIME: 191 s
ACTIVATED CLOTTING TIME: 197 s
ACTIVATED CLOTTING TIME: 202 s
Activated Clotting Time: 191 seconds
Activated Clotting Time: 191 seconds
Activated Clotting Time: 186 seconds
Activated Clotting Time: 191 seconds

## 2016-12-14 LAB — APTT: APTT: 91 s — AB (ref 24–36)

## 2016-12-14 LAB — CULTURE, BLOOD (ROUTINE X 2)

## 2016-12-14 LAB — PHENYTOIN LEVEL, TOTAL: PHENYTOIN LVL: 5.5 ug/mL — AB (ref 10.0–20.0)

## 2016-12-14 MED ORDER — SODIUM CHLORIDE 0.9 % IV SOLN
0.0000 ug/min | INTRAVENOUS | Status: DC
  Administered 2016-12-14: 220 ug/min via INTRAVENOUS
  Administered 2016-12-14: 50 ug/min via INTRAVENOUS
  Administered 2016-12-14: 210 ug/min via INTRAVENOUS
  Administered 2016-12-15 (×2): 330 ug/min via INTRAVENOUS
  Administered 2016-12-15: 400 ug/min via INTRAVENOUS
  Administered 2016-12-15 (×2): 330 ug/min via INTRAVENOUS
  Administered 2016-12-15: 325 ug/min via INTRAVENOUS
  Administered 2016-12-15: 375 ug/min via INTRAVENOUS
  Administered 2016-12-15: 220 ug/min via INTRAVENOUS
  Administered 2016-12-15: 265 ug/min via INTRAVENOUS
  Administered 2016-12-16 (×3): 400 ug/min via INTRAVENOUS
  Administered 2016-12-16: 375 ug/min via INTRAVENOUS
  Filled 2016-12-14 (×20): qty 4

## 2016-12-14 MED ORDER — INSULIN ASPART 100 UNIT/ML ~~LOC~~ SOLN
0.0000 [IU] | SUBCUTANEOUS | Status: DC
  Administered 2016-12-14: 5 [IU] via SUBCUTANEOUS
  Administered 2016-12-14 (×3): 3 [IU] via SUBCUTANEOUS
  Administered 2016-12-14 (×2): 5 [IU] via SUBCUTANEOUS
  Administered 2016-12-14: 8 [IU] via SUBCUTANEOUS
  Administered 2016-12-15 (×4): 5 [IU] via SUBCUTANEOUS
  Administered 2016-12-15: 3 [IU] via SUBCUTANEOUS
  Administered 2016-12-15: 5 [IU] via SUBCUTANEOUS
  Administered 2016-12-16: 2 [IU] via SUBCUTANEOUS
  Administered 2016-12-16: 3 [IU] via SUBCUTANEOUS
  Administered 2016-12-16: 5 [IU] via SUBCUTANEOUS

## 2016-12-14 MED ORDER — LACTULOSE 10 GM/15ML PO SOLN
20.0000 g | Freq: Two times a day (BID) | ORAL | Status: DC
  Administered 2016-12-14 – 2016-12-15 (×3): 20 g
  Filled 2016-12-14 (×3): qty 30

## 2016-12-14 MED ORDER — LEVETIRACETAM 100 MG/ML PO SOLN
500.0000 mg | Freq: Two times a day (BID) | ORAL | Status: DC
  Administered 2016-12-14 – 2016-12-16 (×5): 500 mg
  Filled 2016-12-14 (×5): qty 5

## 2016-12-14 NOTE — Progress Notes (Signed)
[facility] Progress Note    Assessment/ Plan:    1.  Acute oliguric kidney injury: most likely ATN/ hemodynamically mediated.  on CRRT, no signs of renal recovery yet. On heparin through circuit, APTTs appropriate  2.  Erythematous rash: ? Drug eruption from dilatin.  Await levels.  Dilatin moderately dialyzable- rash getting better  3.  Recurrent shock: most likely septic, DRESS less likely as rash improved  On pressor today; on vanc/ CTX  4.  Subural hematoma: followed by NSG.  Awaiting neurologic recovery, does not follow commands    5.  Ventilator dependent respiratory failure: s/p trach, some purulent ooze per nursing today; abx as above  6.  Hypoglycemia: on dextrose infusion, improving  7.  Seizures: dilatin has been stopped, on Keppra  Subjective:    On neo at 77.  Tolerating CRRT   Objective:   BP 104/62   Pulse (!) 112   Temp 98.3 F (36.8 C) (Axillary)   Resp (!) 30   Ht '5\' 7"'  (1.702 m)   Wt 123.7 kg (272 lb 11.3 oz)   SpO2 97%   BMI 42.71 kg/m   Intake/Output Summary (Last 24 hours) at 12/14/16 0805 Last data filed at 12/14/16 0700  Gross per 24 hour  Intake          2359.96 ml  Output             4348 ml  Net         -1988.04 ml   Weight change: -2.2 kg (-4 lb 13.6 oz)  Physical Exam: GEN: sedated, doesn't follow commands HEENT: eyes closed, sclerae anicteric, s/p trach NECK: JVD improving PULM: coarse bilaterally CV tachycardic, irregular ABD obese EXT 1+ upper and lower pitting edema, improving NEURO not responsive to commands; moves L arm in response to R arm stimulation SKIN: blanching erythematous rash over arms and flanks, improving- no skin sloughing  Imaging: Dg Chest Port 1 View  Result Date: 12/12/2016 CLINICAL DATA:  Vent dependant Hx of Asthma, CHF, DM, HTN, COPD EXAM: PORTABLE CHEST - 1 VIEW COMPARISON:  the previous day's study FINDINGS: Endotracheal tube is been removed and tracheostomy device has been  placed in expected location. Stable right IJ hemodialysis catheter. The nasogastric tube is been removed. No pneumothorax. Heart size upper limits normal for technique. Moderate central pulmonary vascular congestion and mild interstitial edema, perhaps slightly improved since previous exam. No definite effusion. Visualized bones unremarkable. IMPRESSION: 1. Tracheostomy placement without apparent complication. 2. Minimal improvement in bilateral edema/infiltrates. Electronically Signed   By: Lucrezia Europe M.D.   On: 12/12/2016 08:18   Dg Abd Portable 1v  Result Date: 12/12/2016 CLINICAL DATA:  New NG tube for medications, brain injury. EXAM: PORTABLE ABDOMEN - 1 VIEW COMPARISON:  None. FINDINGS: Nasogastric tube is in the stomach with tip at or just proximal to the stomach pylorus/duodenal bulb. Paucity of bowel gas. IMPRESSION: Nasogastric tube in the stomach with tip at the expected level of the stomach pylorus or duodenal bulb. Electronically Signed   By: Franki Cabot M.D.   On: 12/12/2016 14:16    Labs: BMET  Recent Labs Lab 11/30/2016 0318 11/26/2016 1529 12/12/16 0509 12/12/16 1700 12/13/16 0520 12/13/16 1608 12/14/16 0248  NA 145 143 142 137 136 134* 135  K 5.5* 4.7 5.0 4.3 4.2 4.1 4.0  CL 113* 113* 109 105 103 101 101  CO2 15* 16* 19* '22 22 23 25  ' GLUCOSE 278* 228* 54* 184* 187* 228* 232*  BUN 266* 232* 181* 117* 83* 65* 53*  CREATININE 7.66* 7.28* 6.06* 4.11* 2.98* 2.60* 2.15*  CALCIUM 7.5* 7.6* 7.9* 7.6* 7.5* 7.3* 7.2*  PHOS 5.9* 4.8* 4.9* 4.1 3.9 3.2 2.7   CBC  Recent Labs Lab 12/23/2016 0318 12/12/16 0509 12/13/16 0520 12/14/16 0248  WBC 6.9 7.3 6.6 6.2  HGB 12.4* 11.8* 11.1* 10.5*  HCT 39.9 37.0* 34.6* 32.7*  MCV 90.9 88.9 87.8 87.0  PLT 322 339 235 264    Medications:    . aspirin  325 mg Oral Daily  . budesonide  0.5 mg Nebulization BID  . cefTRIAXone (ROCEPHIN)  IV  2 g Intravenous Q24H  . chlorhexidine gluconate (MEDLINE KIT)  15 mL Mouth Rinse BID  .  diltiazem  60 mg Oral Q8H  . famotidine (PEPCID) IV  10 mg Intravenous Q24H  . feeding supplement (PRO-STAT SUGAR FREE 64)  30 mL Per Tube 5 X Daily  . insulin aspart  0-15 Units Subcutaneous Q4H  . ipratropium  0.5 mg Nebulization Q6H  . levETIRAcetam  500 mg Intravenous BID  . mouth rinse  15 mL Mouth Rinse 10 times per day  . sodium chloride flush  10-40 mL Intracatheter Q12H  . vancomycin  1,250 mg Intravenous Q24H      Madelon Lips, MD 12/14/2016, 8:05 AM

## 2016-12-14 NOTE — Progress Notes (Signed)
PULMONARY / CRITICAL CARE MEDICINE   Name: XSAVIOR AUPPERLE MRN: [id number] DOB: 01-09-1961    ADMISSION DATE:  12/05/2016 CONSULTATION DATE:  12/04/2016  REFERRING MD:  Dr. Allena Katz   CHIEF COMPLAINT: SDH   Brief:   55 year old male with PMH of DM, COPD, CHF, OSA (on CPAP at HS), atrial flutter (on xarelto), and polysubstance abuse (patient report used cocaine night before admit). Present to ED on 1/3 with new SDH and seizures. Transferred to ICU and ultimately required intubation. Has been slow to improve since that time and remains on ventilator. No further seizures.   SUBJECTIVE:  On low dose neo. Remains on CVVHD.  Putrid smell from trach per nursing (very noticeable in room).  A bit more awake/more spontaneous movement.   VITAL SIGNS: BP (!) 131/109   Pulse (!) 106   Temp 97.8 F (36.6 C) (Axillary)   Resp (!) 25   Ht 5\' 7"  (1.702 m)   Wt 123.7 kg (272 lb 11.3 oz)   SpO2 100%   BMI 42.71 kg/m   HEMODYNAMICS:    VENTILATOR SETTINGS: Vent Mode: PRVC FiO2 (%):  [30 %] 30 % Set Rate:  [18 bmp] 18 bmp Vt Set:  [530 mL] 530 mL PEEP:  [5 cmH20] 5 cmH20 Plateau Pressure:  [12 cmH20-21 cmH20] 21 cmH20  INTAKE / OUTPUT: I/O last 3 completed shifts: In: 3839.7 [I.V.:684.7; Other:50; VQ/MG:8676; IV Piggyback:665] Out: 6604 [Urine:7; Other:6597]  PHYSICAL EXAMINATION:  General:  Obese male, NAD   Neuro:  Sedated on vent, a bit more response off sedation per RN, moving spontaneous but not following commands, easily tachypneic/dyssynchronous with vent so sedation back on  HEENT:  Mod amt purulent secretions around trach Cardiovascular: mild tachy  Lungs:  resps even non labored on vent, diminished throughout  Abdomen:  Soft, non-distended, active bowel sounds  Musculoskeletal:  No acute deformity Skin:  Warm and dry, generalized edema, erythematous rash slightly improved   LABS:  BMET  Recent Labs Lab 12/13/16 0520 12/13/16 1608 12/14/16 0248  NA 136 134* 135  K  4.2 4.1 4.0  CL 103 101 101  CO2 22 23 25   BUN 83* 65* 53*  CREATININE 2.98* 2.60* 2.15*  GLUCOSE 187* 228* 232*   Electrolytes  Recent Labs Lab 12/12/16 0509  12/13/16 0520 12/13/16 1608 12/14/16 0248  CALCIUM 7.9*  < > 7.5* 7.3* 7.2*  MG 2.6*  --  2.5*  --  2.6*  PHOS 4.9*  < > 3.9 3.2 2.7  < > = values in this interval not displayed.  CBC  Recent Labs Lab 12/12/16 0509 12/13/16 0520 12/14/16 0248  WBC 7.3 6.6 6.2  HGB 11.8* 11.1* 10.5*  HCT 37.0* 34.6* 32.7*  PLT 339 235 264   Coag's  Recent Labs Lab 12/12/16 0509 12/13/16 0520 12/14/16 0248  APTT 41* 64* 91*    Sepsis Markers No results for input(s): LATICACIDVEN, PROCALCITON, O2SATVEN in the last 168 hours.  ABG No results for input(s): PHART, PCO2ART, PO2ART in the last 168 hours.  Liver Enzymes  Recent Labs Lab 12/10/16 0320 12/13/2016 1011  12/13/16 1123 12/13/16 1608 12/14/16 0248  AST 84* 69*  --  89*  --   --   ALT 29 32  --  37  --   --   ALKPHOS 80  --   --  132*  --   --   BILITOT 0.7 0.6  --  0.9  --   --   ALBUMIN 1.2*  --   < >  1.1* 1.1* 1.1*  < > = values in this interval not displayed. Cardiac Enzymes No results for input(s): TROPONINI, PROBNP in the last 168 hours.  Glucose  Recent Labs Lab 12/13/16 1210 12/13/16 1549 12/13/16 2007 12/14/16 0007 12/14/16 0352 12/14/16 0751  GLUCAP 185* 187* 197* 253* 199* 197*   Imaging No results found.  STUDIES:  Echo Jan 2017 > LVEF 35-40% with regional wall motion abnormalities,akinesis of the anteroseptal and apical myocardium CT Head 1/4 > large falcine subdural hematoma measuring up to 19 mm thick unchanged, Minimal subarachnoid hemorrhage at sulci along the superior aspect of the interhemispheric fissure. No intracranial mass, intracerebral hemorrhage or evidence of acute infarction CXR 1/4 > Interstitial and alveolar pulmonary edema pattern without definite pleural effusions EEG 1/5 > Diffuse slowing of background, no  seizures seen  EEG 1/6 > generalized polymorphic delta slowing, suggesting severe encephalopathy, no seizures  MRI Head 1/7 > Stable parafalcine and left tentorium subdural hematoma as well as parafalcine subarachnoid hemorrhage Echo 1/20 > 35-40%, reduced systolic function  EEG 1/18>>> This normal EEG is consistent with a moderate to severe generalized non-specific cerebral dysfunction(encephalopathy)  CULTURES: UA 1/4 > Neg Urine 1/4 > Neg Blood 1/4 > Neg  Flu 1/4 > Neg BC 1/15 > Staph in aerobic bottle MRSA by reflex panel >neg Urine 1/15 > neg  Trach asp 1/15 > Serratia Marcesens  Blood 1/18 > GPC>>>staph epidermidis  ANTIBIOTICS: Rocephin 1/4 >1/4  1/6 > Vancomycin 1/4 >1/7 Zosyn 1/4 > 1/7 Vanocmycin 1/16 >>>  Rocephin 1/17 >>   SIGNIFICANT EVENTS: 1/4 > Presents to ED with weakness and headache  1/5 > Intubated  1/18 > Trach via Dr. Jenne Pane   LINES/TUBES: ETT 1/4 >>  RHD 1/18 >>  R PICC 1/13 > 1/16  ASSESSMENT / PLAN:  PULMONARY A: Acute on Chronic Respiratory Failure  H/O COPD, OSA (on CPAP at HS)  Pulm edema P:   Vent support - 8cc/kg  F/u CXR  F/u ABG  BD's  Culture trach site    CARDIOVASCULAR A: Hypotension   CHF  H/O Afib/flutter (was on Xarelto)- now rate controlled. HTN  P: PRN metoprolol  Continue per tube cardizem  Consider resume home xarelto v heparin gtt -- hold for now Low dose neo gtt for BP support - wean as able    RENAL A:   AKI  Hypokalemia - improved  Hypernatremia - resolved.  Hypocalcemia Hyperammonemia   P:  Continue CVVHD per renal  Volume removal as able - monitor BP  F/u chem  Add lactulose 1/21   GASTROINTESTINAL A:   GERD P:   NPO  Pepcid  TF    HEMATOLOGIC A:  On Chronic Anticoagulation (FFP administered in ED)  P:  SCD's  Consider resume xarelto in next 24-48 hours  F/u CBC    INFECTIOUS A:   Bacteremia, MRSA CAP  -PICC removed 1/17  P:   abx as above - vanc, rocephin  Trach site  culture   ENDOCRINE A:   Hypoglycemia  DM, controlled P:   Continue hold insulin   NEUROLOGIC A:   Dilantin toxicity  Interhemispheric SDH  -recent falls and weakness to right lower extremity  R side weakness AMS - seems to be worsening per RN, minimally responsive even off sedation  P:   Continue propofol gtt for vent synchrony  PRN fentanyl  keppra  May need to ask neuro to re-visit  Trend dilantin levels  Consider repeat imaging - hold for now,  seems to be moving more, back to baseline from last week per RN Trend ammonia  RASS goal: 0  FAMILY  - Updates: no family available 1/21 - family meeting planned Monday with Dr. Jamison Neighbor and Palliative care.    - Inter-disciplinary family meet or Palliative Care meeting on-going.    Dirk Dress, NP 12/14/2016  10:13 AM Pager: [phone] or [phone]

## 2016-12-14 NOTE — Progress Notes (Signed)
eLink Physician-Brief Progress Note Patient Name: Jeremiah Perkins DOB: 1961/05/17 MRN: [id number]   Date of Service  12/14/2016  HPI/Events of Note  Was hypoglycemic on lantus 30 bid, hence insulin held Now CBGs high  eICU Interventions  Dc ICU protocol & use SSI -mod If remains high, may need to resume small dose lantus     Intervention Category Intermediate Interventions: Hyperglycemia - evaluation and treatment  Haileigh Pitz V. 12/14/2016, 12:34 AM

## 2016-12-15 DIAGNOSIS — Z9911 Dependence on respirator [ventilator] status: Secondary | ICD-10-CM

## 2016-12-15 DIAGNOSIS — R6521 Severe sepsis with septic shock: Secondary | ICD-10-CM

## 2016-12-15 DIAGNOSIS — N179 Acute kidney failure, unspecified: Secondary | ICD-10-CM

## 2016-12-15 DIAGNOSIS — Z7189 Other specified counseling: Secondary | ICD-10-CM

## 2016-12-15 DIAGNOSIS — A419 Sepsis, unspecified organism: Secondary | ICD-10-CM

## 2016-12-15 LAB — APTT: aPTT: 114 seconds — ABNORMAL HIGH (ref 24–36)

## 2016-12-15 LAB — RENAL FUNCTION PANEL
ALBUMIN: 1.2 g/dL — AB (ref 3.5–5.0)
ANION GAP: 12 (ref 5–15)
ANION GAP: 11 (ref 5–15)
Albumin: 1.3 g/dL — ABNORMAL LOW (ref 3.5–5.0)
BUN: 32 mg/dL — ABNORMAL HIGH (ref 6–20)
BUN: 30 mg/dL — ABNORMAL HIGH (ref 6–20)
CHLORIDE: 101 mmol/L (ref 101–111)
CO2: 23 mmol/L (ref 22–32)
CO2: 26 mmol/L (ref 22–32)
Calcium: 7.5 mg/dL — ABNORMAL LOW (ref 8.9–10.3)
Calcium: 7.5 mg/dL — ABNORMAL LOW (ref 8.9–10.3)
Chloride: 101 mmol/L (ref 101–111)
Creatinine, Ser: 1.51 mg/dL — ABNORMAL HIGH (ref 0.61–1.24)
Creatinine, Ser: 1.54 mg/dL — ABNORMAL HIGH (ref 0.61–1.24)
GFR calc Af Amer: 57 mL/min — ABNORMAL LOW (ref >60)
GFR calc non Af Amer: 50 mL/min — ABNORMAL LOW (ref >60)
GFR calc non Af Amer: 48 mL/min — ABNORMAL LOW (ref >60)
GFR, EST AFRICAN AMERICAN: 56 mL/min — AB (ref >60)
GLUCOSE: 229 mg/dL — AB (ref 65–99)
Glucose, Bld: 236 mg/dL — ABNORMAL HIGH (ref 65–99)
PHOSPHORUS: 2.4 mg/dL — AB (ref 2.5–4.6)
Phosphorus: 2.2 mg/dL — ABNORMAL LOW (ref 2.5–4.6)
Potassium: 4.4 mmol/L (ref 3.5–5.1)
Potassium: 4.3 mmol/L (ref 3.5–5.1)
Sodium: 136 mmol/L (ref 135–145)
Sodium: 138 mmol/L (ref 135–145)

## 2016-12-15 LAB — POCT ACTIVATED CLOTTING TIME
ACTIVATED CLOTTING TIME: 197 s
ACTIVATED CLOTTING TIME: 191 s
ACTIVATED CLOTTING TIME: 230 s
ACTIVATED CLOTTING TIME: 219 s
ACTIVATED CLOTTING TIME: 213 s
Activated Clotting Time: 202 seconds
Activated Clotting Time: 224 seconds
Activated Clotting Time: 224 seconds
Activated Clotting Time: 219 seconds
Activated Clotting Time: 208 seconds

## 2016-12-15 LAB — GLUCOSE, CAPILLARY
GLUCOSE-CAPILLARY: 212 mg/dL — AB (ref 65–99)
GLUCOSE-CAPILLARY: 198 mg/dL — AB (ref 65–99)
GLUCOSE-CAPILLARY: 203 mg/dL — AB (ref 65–99)
Glucose-Capillary: 223 mg/dL — ABNORMAL HIGH (ref 65–99)
Glucose-Capillary: 236 mg/dL — ABNORMAL HIGH (ref 65–99)
Glucose-Capillary: 208 mg/dL — ABNORMAL HIGH (ref 65–99)

## 2016-12-15 LAB — CBC
HCT: 36.2 % — ABNORMAL LOW (ref 39.0–52.0)
HEMOGLOBIN: 11.8 g/dL — AB (ref 13.0–17.0)
MCH: 29.1 pg (ref 26.0–34.0)
MCHC: 32.6 g/dL (ref 30.0–36.0)
MCV: 89.4 fL (ref 78.0–100.0)
PLATELETS: 387 10*3/uL (ref 150–400)
RBC: 4.05 MIL/uL — AB (ref 4.22–5.81)
RDW: 18.4 % — ABNORMAL HIGH (ref 11.5–15.5)
WBC: 14.2 10*3/uL — AB (ref 4.0–10.5)

## 2016-12-15 LAB — MAGNESIUM: MAGNESIUM: 2.7 mg/dL — AB (ref 1.7–2.4)

## 2016-12-15 LAB — TRIGLYCERIDES: Triglycerides: 587 mg/dL — ABNORMAL HIGH (ref <150)

## 2016-12-15 MED ORDER — PROPOFOL 500 MG/50ML IV EMUL
INTRAVENOUS | Status: AC
  Administered 2016-12-15: 13:00:00
  Filled 2016-12-15: qty 50

## 2016-12-15 MED ORDER — LEVALBUTEROL HCL 0.63 MG/3ML IN NEBU
0.6300 mg | INHALATION_SOLUTION | Freq: Four times a day (QID) | RESPIRATORY_TRACT | Status: DC
  Administered 2016-12-15 – 2016-12-16 (×6): 0.63 mg via RESPIRATORY_TRACT
  Filled 2016-12-15 (×6): qty 3

## 2016-12-15 MED ORDER — MORPHINE BOLUS VIA INFUSION
1.0000 mg | INTRAVENOUS | Status: DC | PRN
  Administered 2016-12-15: 1 mg via INTRAVENOUS
  Filled 2016-12-15: qty 4

## 2016-12-15 MED ORDER — DILTIAZEM 12 MG/ML ORAL SUSPENSION
60.0000 mg | Freq: Three times a day (TID) | ORAL | Status: DC
  Administered 2016-12-15 – 2016-12-16 (×4): 60 mg
  Filled 2016-12-15 (×6): qty 6

## 2016-12-15 MED ORDER — SODIUM CHLORIDE 0.9 % IV SOLN
1.0000 mg/h | INTRAVENOUS | Status: DC
  Administered 2016-12-15: 5 mg/h via INTRAVENOUS
  Filled 2016-12-15: qty 10
  Filled 2016-12-15: qty 20

## 2016-12-15 NOTE — Progress Notes (Signed)
Pharmacy Antibiotic Note  Jeremiah Perkins is a 56 y.o. male admitted on 12/15/2016 with bacteremia.  Pharmacy has been consulted for Vancomycin dosing.  Pt continues on CRRT  Plan: Vancomycin 1250mg  IV q24 Vanc trough in AM Follow clinical status, renal function  Height: 5\' 7"  (170.2 cm) Weight: 266 lb 1.5 oz (120.7 kg) IBW/kg (Calculated) : 66.1  Temp (24hrs), Avg:98.1 F (36.7 C), Min:97.3 F (36.3 C), Max:98.6 F (37 C)   Recent Labs Lab Dec 12, 2016 0318 12-12-2016 0750  12/12/16 0509  12/13/16 0520 12/13/16 1608 12/14/16 0248 12/14/16 1600 12/15/16 0520  WBC 6.9  --   --  7.3  --  6.6  --  6.2  --  14.2*  CREATININE 7.66*  --   < > 6.06*  < > 2.98* 2.60* 2.15* 1.72* 1.51*  VANCORANDOM  --  20  --   --   --   --   --   --   --   --   < > = values in this interval not displayed.  Estimated Creatinine Clearance: 67.1 mL/min (by C-G formula based on SCr of 1.51 mg/dL (H)).    No Known Allergies  Antimicrobials this admission: Zosyn 1/4>>1/6 Vanc 1/4>>1/5; 1/16>> CTX x 1 1/4; 1/6>>1/10; 1/17>>  Dose adjustments this admission: 1/18 VR = 20  Microbiology results: 1/4 BCx: NGTD 1/4 UCxx2: NEG 1/4 Resp PCR: neg  1/4 MRSA PCR: neg 1/15 TA - serratia s to ctx 1/15 Blood - 2/2 CoNS 1/18 Blood - 2/2 CoNS 1/21 Trach (+)abundant GNR  Thank you for allowing pharmacy to be a part of this patient's care.  Renaee Munda 12/15/2016 3:10 PM

## 2016-12-15 NOTE — Consult Note (Signed)
   [facility] Inpatient Consult   12/15/2016  Jeremiah Perkins 1961/10/08 [id number]    [facility] Care Management follow up. Writer was present during Palliative Medicine goals of care meeting with family, Dr. Celine Mans, NP with Palliative Medicine, inpatient [facility] and patient's nurse. Please see goals of care meeting details in MD notes and Palliative Medicine notes. Appreciative of the collaboration of the inpatient team. Will continue to follow along and update [facility] team as needed.    [name], MSN-Ed, RN,BSN [facility] Liaison [phone]

## 2016-12-15 NOTE — Plan of Care (Signed)
  Interdisciplinary Goals of Care Family Meeting   Date carried out:: 12/15/2016  Location of the meeting: Chapel  Member's involved: Physician, Bedside Registered Nurse, Social Worker, Family Member or next of kin and Other: Case Worker  Engineer, agricultural or acting medical decision maker:  Wife - Diane Office manager  Discussion: We discussed goals of care for [employer] .  I had a lengthy discussion with the patient's wife, knees, and daughter today. We discussed his multiorgan dysfunction and continued altered mental status. Given his continued state of septic shock and gradual clinical decline we discussed that we could continue supporting the patient maximally medically or transition over to full comfort care and allow a natural passing. Patient's wife indicated that the patient would not wish to be maintained in his current artificial state. We discussed initiating continuous infusion of morphine for relief of dyspnea and analgesia as well as discontinuing hemodialysis and vasopressor support. The patient's wife wishes to discuss this further with other family members before reaching a final decision. We offered hospital chaplain for spiritual support. They have their own family minister.  Code status: Full Code  Disposition: Continue current acute care  Time spent for the meeting: 27 minutes   Lawanda Cousins 12/15/2016, 11:23 AM

## 2016-12-15 NOTE — Progress Notes (Signed)
Palliative Medicine Goals of Care meeting held with PMT Nurse Practitioner, Dr. Jamison Neighbor, [facility] Case Manager, this RN Case Manager, and pt's family present.  Family seems to feel that pt would not want to live like this, but they would like some time to contact additional family members and talk with each other before making a decision.  Will continue to follow and offer support to staff/family.    Quintella Baton, RN, BSN  Trauma/Neuro ICU Case Manager [phone]

## 2016-12-15 NOTE — Progress Notes (Signed)
[facility] Progress Note   BACKGROUND:   56 y.o.malewith medical history significant of CHF, Afib on Xarelto anticoagulation therapy, DM type II, CKD, HTN, COPD, OSA (nocturnal CPAP at home)  who was recently hospitalized (10/26/11/9) for sepsis associated with UTI. Presented to the ED with c/o multiple falls during the two weeks prior to admission. Reported cocaine use PTA. Developed fevers, hypotension, sz's, found to have SDH. Resp failure required trach. Developed oliguric AKI with baseline creatinine of 1.1 up to 7.7 and Renal was consulted 1/18, CRRT initiated 1/18 for volume and electrolyte management.  Temp cath placed  1/18 by CCM.   Assessment/ Plan:    1. Acute oliguric kidney injury: most likely ATN/ hemodynamically mediated. Oligoanuric, no signs of renal recovery. On heparin through circuit, APTTs appropriate. K OK, phos lowish but not yet requiring replacement (2.2 today). Pull 50-100 as tolerated. Vol looking better. 2. Erythematous rash: ? Drug eruption from dilantin (which was stopped). First day I have seen. LE's erythematous. 3. Recurrent shock: most likely septic, DRESS less likely as rash improved  On pressor today; on vanc/ CTX 4. Subdural hematoma: followed by NSG.  Awaiting neurologic recovery, does not follow commands.   5. VDRF: s/p trach 6. New foul tracheal aspirate - vanco/rocephin. Cultures pending of trach aspirate  7. Hypoglycemia: on dextrose infusion, improving 8. Seizures: dilantin has been stopped, on Keppra 9. Disposition - Dr. Ashok Cordia meeting with family this AM.  Subjective:    CRRT no technical issues Heparin through circuit No filter issues reported   Objective:   BP 104/83   Pulse 91   Temp 98.2 F (36.8 C)   Resp (!) 29   Ht _0  (1.702 m)   Wt 120.7 kg (266 lb 1.5 oz)   SpO2 97%   BMI 41.68 kg/m   Intake/Output Summary (Last 24 hours) at 12/15/16 1034 Last data filed at 12/15/16 1000  Gross per 24 hour  Intake            5071.2 ml  Output             6596 ml  Net          -1524.8 ml   Weight summary: Date/Time  Weight                            12/15/16 0406  120.7 kg     12/14/16 0430  123.7 kg      12/13/16 0530  125.9 kg    12/12/16 0421  127.9 kg      12/19/2016 0600  126.9 kg     CRRT started    Physical Exam: GEN: sedated, doesn't follow commands Eyes open, not tracking, sclerae anicteric, s/p trach. Secretions do  have foul smell, brown and purulent looking in suction catheter PULM: coarse bilaterally S1S2 No S3 Distant ht sounds ABD obese EXT Trace upper and lower pitting edema NEURO Moves eyes in response to exam/painful stimuli to LE;s SKIN: blanching erythematous rash over arms and flanks, improving- no skin sloughing    Recent Labs Lab 12/12/16 0509 12/12/16 1700 12/13/16 0520 12/13/16 1608 12/14/16 0248 12/14/16 1600 12/15/16 0520  NA 142 137 136 134* 135 135 136  K 5.0 4.3 4.2 4.1 4.0 3.9 4.4  CL 109 105 103 101 101 100* 101  CO2 19* _1 GLUCOSE 54* 184* 187* 228* 232* 225* 229*  BUN 181* 117* 83*  65* 53* 40* 32*  CREATININE 6.06* 4.11* 2.98* 2.60* 2.15* 1.72* 1.51*  CALCIUM 7.9* 7.6* 7.5* 7.3* 7.2* 7.4* 7.5*  PHOS 4.9* 4.1 3.9 3.2 2.7 2.4* 2.2*   Results for OSCAR, HANK (MRN [id number]) as of 12/15/2016 10:42  Ref. Range 12/14/2016 08:30  Phenytoin Lvl Latest Ref Range: 10.0 - 20.0 ug/mL 5.5 (L)    Recent Labs Lab 12/12/16 0509 12/13/16 0520 12/14/16 0248 12/15/16 0520  WBC 7.3 6.6 6.2 14.2*  HGB 11.8* 11.1* 10.5* 11.8*  HCT 37.0* 34.6* 32.7* 36.2*  MCV 88.9 87.8 87.0 89.4  PLT 339 235 264 387    Medications:    . aspirin  325 mg Oral Daily  . budesonide  0.5 mg Nebulization BID  . cefTRIAXone (ROCEPHIN)  IV  2 g Intravenous Q24H  . chlorhexidine gluconate (MEDLINE KIT)  15 mL Mouth Rinse BID  . diltiazem  60 mg Oral Q8H  . famotidine (PEPCID) IV  10 mg Intravenous Q24H  . feeding supplement (PRO-STAT SUGAR FREE 64)  30 mL  Per Tube 5 X Daily  . insulin aspart  0-15 Units Subcutaneous Q4H  . ipratropium  0.5 mg Nebulization Q6H  . lactulose  20 g Per Tube BID  . levalbuterol  0.63 mg Nebulization Q6H  . levETIRAcetam  500 mg Per Tube BID  . mouth rinse  15 mL Mouth Rinse 10 times per day  . sodium chloride flush  10-40 mL Intracatheter Q12H  . vancomycin  1,250 mg Intravenous Q24H   . feeding supplement (VITAL HIGH PROTEIN) 1,000 mL (12/15/16 1000)  . fentaNYL infusion INTRAVENOUS Stopped (12/12/16 1900)  . heparin 10,000 units/ 20 mL infusion syringe 2,000 Units/hr (12/15/16 1000)  . phenylephrine (NEO-SYNEPHRINE) Adult infusion 330 mcg/min (12/15/16 1000)  . dialysis replacement fluid (prismasate) 400 mL/hr at 12/15/16 0724  . dialysis replacement fluid (prismasate) 500 mL/hr at 12/14/16 1548  . dialysate (PRISMASATE) 2,000 mL/hr at 12/15/16 0954  . propofol (DIPRIVAN) infusion 30 mcg/kg/min (12/15/16 1000)    Jamal Maes, MD [facility] [phone] Pager 12/15/2016, 11:00 AM

## 2016-12-15 NOTE — Progress Notes (Signed)
PULMONARY / CRITICAL CARE MEDICINE   Name: Jeremiah Perkins MRN: [id number] DOB: 01/03/1961    ADMISSION DATE:  12/07/2016 CONSULTATION DATE:  07-Dec-2016  REFERRING MD:  Dr. Allena Katz   CHIEF COMPLAINT: Subdural Hematoma  HPI:  56 y.o. male with PMH of DM, COPD, CHF, OSA (on CPAP at HS), atrial flutter (on xarelto), and polysubstance abuse (patient report used cocaine night before admit). Present to ED on 1/3 with new SDH and seizures. Transferred to ICU and ultimately required intubation. Has been slow to improve since that time and remains on ventilator. No further seizures.   SUBJECTIVE: No acute events overnight. Patient remains on vasopressor support.  REVIEW OF SYSTEMS:  Unable to obtain given ventilator requirement and altered mental status.   VITAL SIGNS: BP (!) 120/92   Pulse (!) 111   Temp 98.4 F (36.9 C) (Axillary)   Resp (!) 30   Ht 5\' 7"  (1.702 m)   Wt 266 lb 1.5 oz (120.7 kg)   SpO2 100%   BMI 41.68 kg/m   HEMODYNAMICS:    VENTILATOR SETTINGS: Vent Mode: PRVC FiO2 (%):  [30 %] 30 % Set Rate:  [18 bmp] 18 bmp Vt Set:  [530 mL] 530 mL PEEP:  [5 cmH20] 5 cmH20 Plateau Pressure:  [12 cmH20-21 cmH20] 18 cmH20  INTAKE / OUTPUT: I/O last 3 completed shifts: In: 6127 [I.V.:3542; Other:100; NG/GT:2030; IV Piggyback:455] Out: 4970 [YOVZC:5885; Stool:200]  PHYSICAL EXAMINATION:  General:  No distress. Family at bedside. Currently on CVVHD. Integument:  Warm & dry. No rash on exposed skin. Extremities:  No cyanosis or clubbing.  HEENT:  No scleral injection or icterus. Endotracheal tube in place.  Cardiovascular:  Regular rate. No edema.  Pulmonary:  Clear with auscultation anteriorly. Symmetric chest wall rise on ventilator.  Abdomen: Soft. Normal bowel sounds. Protuberant. Neurological: On propofol for sedation. No withdrawal to pain in extremities. Pupils equal, symmetric, and reactive.  LABS: CBC Latest Ref Rng & Units 12/15/2016 12/14/2016 12/13/2016  WBC 4.0 -  10.5 K/uL 14.2(H) 6.2 6.6  Hemoglobin 13.0 - 17.0 g/dL 11.8(L) 10.5(L) 11.1(L)  Hematocrit 39.0 - 52.0 % 36.2(L) 32.7(L) 34.6(L)  Platelets 150 - 400 K/uL 387 264 235   BMP Latest Ref Rng & Units 12/15/2016 12/14/2016 12/14/2016  Glucose 65 - 99 mg/dL 027(X) 412(I) 786(V)  BUN 6 - 20 mg/dL 67(M) 09(O) 70(J)  Creatinine 0.61 - 1.24 mg/dL 6.28(Z) 6.62(H) 4.76(L)  BUN/Creat Ratio 9 - 20 - - -  Sodium 135 - 145 mmol/L 136 135 135  Potassium 3.5 - 5.1 mmol/L 4.4 3.9 4.0  Chloride 101 - 111 mmol/L 101 100(L) 101  CO2 22 - 32 mmol/L 23 24 25   Calcium 8.9 - 10.3 mg/dL 7.5(L) 7.4(L) 7.2(L)    STUDIES:  Echo Jan 2017 > LVEF 35-40% with regional wall motion abnormalities,akinesis of the anteroseptal and apical myocardium CT Head 1/4 > large falcine subdural hematoma measuring up to 19 mm thick unchanged, Minimal subarachnoid hemorrhage at sulci along the superior aspect of the interhemispheric fissure. No intracranial mass, intracerebral hemorrhage or evidence of acute infarction CXR 1/4 > Interstitial and alveolar pulmonary edema pattern without definite pleural effusions EEG 1/5 > Diffuse slowing of background, no seizures seen  EEG 1/6 > generalized polymorphic delta slowing, suggesting severe encephalopathy, no seizures  MRI Head 1/7 > Stable parafalcine and left tentorium subdural hematoma as well as parafalcine subarachnoid hemorrhage Echo 1/20 > 35-40%, reduced systolic function  EEG 1/18>>> This normal EEG is consistent with a  moderate to severe generalized non-specific cerebral dysfunction(encephalopathy)  MICROBIOLOGY: Urine Ctx x2 1/4:  Insignificant Growth & Negative Blood Ctx x2 1/4:  Negative MRSA PCR 1/4:  Negative  Respiratory Panel PCR 1/4:  Negative  Tracheal Asp Ctx 1/5: Negative Urine Ctx 1/15:  Yeast Blood Ctx x2 1/15:  Staph epi 2/2 Bottles Tracheal Asp Ctx 1/15:  Serratia marcescens  Blood Ctx x2 1/18:  Staph epi 2/2 Bottles Tracheal Site Ctx 1/21  >>  ANTIBIOTICS: Zosyn 1/4 - 1/6 Vanocmycin 1/4 - 1/5; 1/16 >>  Rocephin 1/4 - 1/10; 1/17 >>  SIGNIFICANT EVENTS: 1/04 - Presents to ED with weakness and headache  1/05 - Intubated  1/18 - Trach via Dr. Jenne Pane   LINES/TUBES: ETT 1/4 - 1/18 R PICC 1/13 - 1/16 Trach #8 (Dr. Jenne Pane) >>  Foley 1/4 >> L NGT 1/18 >> L HD CATH 1/18 >> PIV  ASSESSMENT / PLAN:  56 y.o. male with interhemispheric subdural hematoma and ongoing septic shock. In the setting of his bacteremia and tracheitis I believe his sepsis as multiple possible sources. With his continued altered mental status I am concerned that there is a significant degree of damage from his hemorrhage. Please refer to lengthy family discussion documented today.  1. Interhemispheric Subdural Hematoma:  Continuing Keppra via tube BID. 2. Septic Shock:  Multifactorial from MRSE Bacteremia and tracheitis. Continuing support of MAP with Neo-Synephrine drip that is titrated.  3. MRSE Bacteremia:  Continuing Vancomycin Day #7. Continuing line holiday.  4. Tracheitis:  Continuing empiric Rocephin Day #6 while awaiting cultures. 5. Acute Hypoxic Respiratory Failure:  Now s/p tracheostomy. Continuing full ventilator support depending on family wishes.  6. Acute Renal Failure:  Continuing on CVVHD. Nephrology following & appreciate assistance. Monitoring electrolytes twice daily.  7. Hypertriglyceridemia:  Likely due to Propofol. Discontinuing Propofol & starting Morphine drip for relief of pain & discomfort.  8. OSA:  S/P Tracheostomy placement.  9. COPD:  Continuing Atrovent & Xopenex nebs q6hr. 10. Atrial Fibrillation/Flutter: Continuing telemetry monitoring. Continuing diltiazem 60 mg via tube every 8 hour. Holding on systemic anticoagulation. 11. Essential Hypertension:  Currently hypotensive. Holding anti-hypertensive medications.  12. GERD:  Continuing Pepcid IV q24hr.  13. Diabetes Mellitus:  Glucose reasonably controlled. Continuing  accu-checks q4hr with sliding scale insulin per moderate algorithm.  14. Diet:  Continuing tube feedings for now. 15. Prophylaxis:  Pepcid IV q24hr & SCDs. 16. Disposition: May transition to full comfort care depending upon family wishes with ongoing private family discussions.  I have spent a total of 35 minutes of critical care time today caring for the patient and reviewing the patient's electronic medical record.    Donna Christen Jamison Neighbor, M.D. [facility] Pulmonary [facility] Care Pager:  [phone] After 3pm or if no response, call [phone] 12/15/2016  10:12 AM

## 2016-12-16 DIAGNOSIS — Z515 Encounter for palliative care: Secondary | ICD-10-CM

## 2016-12-16 LAB — GLUCOSE, CAPILLARY
GLUCOSE-CAPILLARY: 223 mg/dL — AB (ref 65–99)
GLUCOSE-CAPILLARY: 26 mg/dL — AB (ref 65–99)
Glucose-Capillary: 183 mg/dL — ABNORMAL HIGH (ref 65–99)
Glucose-Capillary: 131 mg/dL — ABNORMAL HIGH (ref 65–99)

## 2016-12-16 LAB — CBC WITH DIFFERENTIAL/PLATELET
BASOS ABS: 0 10*3/uL (ref 0.0–0.1)
BASOS PCT: 0 %
EOS ABS: 0.1 10*3/uL (ref 0.0–0.7)
Eosinophils Relative: 1 %
HEMATOCRIT: 37.3 % — AB (ref 39.0–52.0)
Hemoglobin: 11.7 g/dL — ABNORMAL LOW (ref 13.0–17.0)
LYMPHS PCT: 9 %
Lymphs Abs: 1.3 10*3/uL (ref 0.7–4.0)
MCH: 28.3 pg (ref 26.0–34.0)
MCHC: 31.4 g/dL (ref 30.0–36.0)
MCV: 90.3 fL (ref 78.0–100.0)
MONO ABS: 0.7 10*3/uL (ref 0.1–1.0)
Monocytes Relative: 5 %
NEUTROS ABS: 11.9 10*3/uL — AB (ref 1.7–7.7)
Neutrophils Relative %: 85 %
PLATELETS: 351 10*3/uL (ref 150–400)
RBC: 4.13 MIL/uL — ABNORMAL LOW (ref 4.22–5.81)
RDW: 18.8 % — AB (ref 11.5–15.5)
WBC MORPHOLOGY: INCREASED
WBC: 14 10*3/uL — ABNORMAL HIGH (ref 4.0–10.5)

## 2016-12-16 LAB — AEROBIC CULTURE W GRAM STAIN (SUPERFICIAL SPECIMEN): Gram Stain: NONE SEEN

## 2016-12-16 LAB — POCT ACTIVATED CLOTTING TIME
ACTIVATED CLOTTING TIME: 213 s
Activated Clotting Time: 208 seconds
Activated Clotting Time: 202 seconds
Activated Clotting Time: 202 seconds

## 2016-12-16 LAB — RENAL FUNCTION PANEL
ALBUMIN: 1.2 g/dL — AB (ref 3.5–5.0)
ANION GAP: 10 (ref 5–15)
BUN: 28 mg/dL — ABNORMAL HIGH (ref 6–20)
CALCIUM: 7.7 mg/dL — AB (ref 8.9–10.3)
CO2: 24 mmol/L (ref 22–32)
Chloride: 103 mmol/L (ref 101–111)
Creatinine, Ser: 1.66 mg/dL — ABNORMAL HIGH (ref 0.61–1.24)
GFR calc non Af Amer: 44 mL/min — ABNORMAL LOW (ref >60)
GFR, EST AFRICAN AMERICAN: 51 mL/min — AB (ref >60)
Glucose, Bld: 203 mg/dL — ABNORMAL HIGH (ref 65–99)
PHOSPHORUS: 2.6 mg/dL (ref 2.5–4.6)
Potassium: 4.8 mmol/L (ref 3.5–5.1)
SODIUM: 137 mmol/L (ref 135–145)

## 2016-12-16 LAB — APTT: APTT: 116 s — AB (ref 24–36)

## 2016-12-16 LAB — VANCOMYCIN, TROUGH: Vancomycin Tr: 32 ug/mL (ref 15–20)

## 2016-12-16 LAB — AEROBIC CULTURE  (SUPERFICIAL SPECIMEN)

## 2016-12-16 LAB — MAGNESIUM: MAGNESIUM: 2.6 mg/dL — AB (ref 1.7–2.4)

## 2016-12-16 MED ORDER — SODIUM CHLORIDE 0.9 % IV SOLN
0.0000 ug/min | INTRAVENOUS | Status: DC
  Administered 2016-12-16 (×2): 400 ug/min via INTRAVENOUS
  Filled 2016-12-16 (×3): qty 8

## 2016-12-16 MED ORDER — DEXTROSE 5 % IV SOLN
2.0000 ug/min | INTRAVENOUS | Status: DC
  Filled 2016-12-16: qty 4

## 2016-12-16 MED ORDER — DEXTROSE 50 % IV SOLN
INTRAVENOUS | Status: AC
  Administered 2016-12-16: 50 mL
  Filled 2016-12-16: qty 50

## 2016-12-17 ENCOUNTER — Other Ambulatory Visit: Payer: Self-pay

## 2016-12-17 MED FILL — Medication: Qty: 1 | Status: AC

## 2016-12-17 MED FILL — Medication: Qty: 1 | Status: CN

## 2016-12-17 NOTE — Patient Outreach (Signed)
   Attempted unsuccessfully to contact Jeremiah Perkins to offer  Condolences.

## 2016-12-23 ENCOUNTER — Ambulatory Visit: Payer: PPO

## 2016-12-25 NOTE — Progress Notes (Signed)
[facility] Progress Note    Subjective:    CRRT no technical issues Heparin through circuit No filter issues reported Maxed out on neo with soft BP's Keeping volume even   Objective:   BP 107/64   Pulse (!) 112   Temp 99.9 F (37.7 C) (Axillary)   Resp (!) 27   Ht '5\' 7"'  (1.702 m)   Wt 120.5 kg (265 lb 10.5 oz)   SpO2 100%   BMI 41.61 kg/m   Intake/Output Summary (Last 24 hours) at 01-12-17 1300 Last data filed at Jan 12, 2017 1200  Gross per 24 hour  Intake          4601.69 ml  Output             5237 ml  Net          -635.31 ml   Weight summary: Date/Time  Weight                      01-12-2017  120.5 kg    12/15/16   120.7 kg     12/14/16   123.7 kg      12/13/16   125.9 kg    12/12/16   127.9 kg      12/15/2016   126.9 kg     CRRT started    Physical Exam: BP 107/64   Pulse (!) 112   Temp 99.9 F (37.7 C) (Axillary)   Resp (!) 27   Ht '5\' 7"'  (1.702 m)   Wt 120.5 kg (265 lb 10.5 oz)   SpO2 100%   BMI 41.61 kg/m   MO AAM Intubated Foul smell in room Sedated, doesn't follow commands Eyes open, not tracking, sclerae anicteric Trach/vent Markedly increased WOB. Lungs w/coarse bilaterally S1S2 No S3 Distant ht sounds Abd obese Trace pitting edema both LE's and arms Moves eyes in response to exam/painful stimuli to LE;s   Recent Labs Lab 12/13/16 0520 12/13/16 1608 12/14/16 0248 12/14/16 1600 12/15/16 0520 12/15/16 1700 2017-01-12 0450  NA 136 134* 135 135 136 138 137  K 4.2 4.1 4.0 3.9 4.4 4.3 4.8  CL 103 101 101 100* 101 101 103  CO2 '22 23 25 24 23 26 24  ' GLUCOSE 187* 228* 232* 225* 229* 236* 203*  BUN 83* 65* 53* 40* 32* 30* 28*  CREATININE 2.98* 2.60* 2.15* 1.72* 1.51* 1.54* 1.66*  CALCIUM 7.5* 7.3* 7.2* 7.4* 7.5* 7.5* 7.7*  PHOS 3.9 3.2 2.7 2.4* 2.2* 2.4* 2.6   Results for HARDEN, BRAMER (MRN [id number]) as of 12/15/2016 10:42  Ref. Range 12/14/2016 08:30  Phenytoin Lvl Latest Ref Range: 10.0 - 20.0 ug/mL 5.5 (L)     Recent Labs Lab 12/13/16 0520 12/14/16 0248 12/15/16 0520 Jan 12, 2017 0450  WBC 6.6 6.2 14.2* 14.0*  NEUTROABS  --   --   --  11.9*  HGB 11.1* 10.5* 11.8* 11.7*  HCT 34.6* 32.7* 36.2* 37.3*  MCV 87.8 87.0 89.4 90.3  PLT 235 264 387 351    Medications:    . aspirin  325 mg Oral Daily  . budesonide  0.5 mg Nebulization BID  . cefTRIAXone (ROCEPHIN)  IV  2 g Intravenous Q24H  . chlorhexidine gluconate (MEDLINE KIT)  15 mL Mouth Rinse BID  . diltiazem  60 mg Per Tube Q8H  . famotidine (PEPCID) IV  10 mg Intravenous Q24H  . feeding supplement (PRO-STAT SUGAR FREE 64)  30 mL Per Tube 5 X Daily  . insulin aspart  0-15 Units Subcutaneous Q4H  . ipratropium  0.5 mg Nebulization Q6H  . levalbuterol  0.63 mg Nebulization Q6H  . levETIRAcetam  500 mg Per Tube BID  . mouth rinse  15 mL Mouth Rinse 10 times per day  . sodium chloride flush  10-40 mL Intracatheter Q12H   . feeding supplement (VITAL HIGH PROTEIN) 1,000 mL (12-31-16 0700)  . fentaNYL infusion INTRAVENOUS Stopped (12/12/16 1900)  . heparin 10,000 units/ 20 mL infusion syringe 1,850 Units/hr (December 31, 2016 1233)  . morphine Stopped (12-31-2016 0430)  . phenylephrine (NEO-SYNEPHRINE) Adult infusion 400 mcg/min (12/31/16 0925)  . dialysis replacement fluid (prismasate) 400 mL/hr at 2016/12/31 0925  . dialysis replacement fluid (prismasate) 500 mL/hr at 12/31/2016 0911  . dialysate (PRISMASATE) 2,000 mL/hr at 12/31/2016 1141   BACKGROUND:   56 y.o.malewith medical history significant of CHF, Afib on Xarelto anticoagulation therapy, DM type II, CKD, HTN, COPD, OSA (nocturnal CPAP at home)  who was recently hospitalized (10/26/11/9) for sepsis associated with UTI. Presented to the ED with c/o multiple falls during the two weeks prior to admission. Reported cocaine use PTA. Developed fevers, hypotension, sz's, found to have SDH. Resp failure required trach. Developed oliguric AKI with baseline creatinine of 1.1 up to 7.7 and Renal was  consulted 1/18, CRRT initiated 1/18 for volume and electrolyte management.  Temp cath placed  1/18 by CCM.   Assessment/ Plan:    1. Oliguric AKI - ATN/ hemodynamically mediated. Oligoanuric, no signs of renal recovery. On heparin through circuit, APTTs appropriate. K and phos both fine. . 2. Recurrent shock: most likely septic. Maxed out on neo. 3. Subdural hematoma: followed by NSG.  No  neurologic recovery, does not follow commands. Poor neurologic status/prognosis  4. VDRF: s/p trach 5. MRSE bacteremia - on vanco 6. Tracheitis - rocephin (serratia in trach asp from 1/21).  7. Seizures: dilantin has been stopped, on Keppra 8. Disposition - Wife said was leaning toward comfort Rx but has not made decision and is not coming to the hospital today.  Continuing same CRRT pending any decisions.  Jamal Maes, MD [facility] Pager 12/31/2016, 1:00 PM

## 2016-12-25 NOTE — Progress Notes (Signed)
Nutrition Follow-up  DOCUMENTATION CODES:   Morbid obesity  INTERVENTION:   Continue:  Vital High Protein @ 45 ml/hr (1080 ml/day) 30 ml Prostat five times per day Provides: 1580 kcal, 169 grams protein, and 902 ml H2O.   NUTRITION DIAGNOSIS:   Inadequate oral intake related to inability to eat as evidenced by NPO status. Ongoing.   GOAL:   Provide needs based on ASPEN/SCCM guidelines Met.   MONITOR:   TF tolerance, Vent status, Labs  ASSESSMENT:   55 year old male with PMH of DM, COPD, CHF, OSA (on CPAP at HS), atrial flutter (on xarelto), and polysubstance abuse (patient report used cocaine last night), denies ETOH. Present to ED on 1/3 with new SDH.  Remains on vent via trach Maxed on 1 pressor 1/18 CRRT started for volume management due to AKI Poor prognosis, family deciding on care Pt discussed during ICU rounds and with RN.  Propofol now off  Diet Order:  Diet NPO time specified  Skin:  Reviewed, no issues  Last BM:  1/14 300 ml via rectal tube  Height:   Ht Readings from Last 1 Encounters:  12/18/2016 5' 7" (1.702 m)    Weight:   Wt Readings from Last 1 Encounters:  12/15/2016 265 lb 10.5 oz (120.5 kg)    Ideal Body Weight:  67.2 kg  BMI:  Body mass index is 41.61 kg/m.  Estimated Nutritional Needs:   Kcal:  1351-1720  Protein:  >/= 168 grams  Fluid:  > 1.5 L/day  EDUCATION NEEDS:   No education needs identified at this time  Heather Pitts RD, LDN, CNSC [phone] Pager [phone] After Hours Pager  

## 2016-12-25 NOTE — Progress Notes (Signed)
Pt's BP dropping. SBP 70s. Elink MD notified and orders received. CBG 26. Pt not breathing as fast as has been previously but still breathing over vent in low 20s. I went to call wife while another RN gave Dextrose to treat CBG. Pt's HR 30s. Code began (see code log). Explained to wife situation. She requested to make a call and call back with decision on stopping CPR. Code team arrived. MD updated. Wife called back and informed myself and Junious Dresser, RN via phone to stop CPR. Stated she will be coming to the hospital today.Two RNs unable to auscultate heartbeat. Pronounced dead at 1623 by myself and K. Gordy Levan, RN. MD notified of pt's death.

## 2016-12-25 NOTE — Progress Notes (Signed)
Pharmacy Antibiotic Note  Jeremiah Perkins is a 56 y.o. male admitted on 12/14/2016 with bacteremia.  Pharmacy has been consulted for Vancomycin dosing.Pt continues on CRRT.  Vancomycin random this am is above goal at 32 mcg/ml.  1/21 Trach site: abundant serratia marcescens. 1/18 BC 2/2 staph epi. Afebrile, WBC 14.   Plan: dc scheduled dose of vancomycin, recheck vancomycin random on Thursday 1/25 and redose when < = 15 mcg/ml Continues on CTX 2 gm q24   Height: 5\' 7"  (170.2 cm) Weight: 265 lb 10.5 oz (120.5 kg) IBW/kg (Calculated) : 66.1  Temp (24hrs), Avg:98.4 F (36.9 C), Min:97.7 F (36.5 C), Max:100 F (37.8 C)   Recent Labs Lab 12/27/16 0750  12/12/16 0509  12/13/16 0520  12/14/16 0248 12/14/16 1600 12/15/16 0520 12/15/16 1700 12/14/2016 0450 12/01/2016 0900  WBC  --   --  7.3  --  6.6  --  6.2  --  14.2*  --  14.0*  --   CREATININE  --   < > 6.06*  < > 2.98*  < > 2.15* 1.72* 1.51* 1.54* 1.66*  --   VANCOTROUGH  --   --   --   --   --   --   --   --   --   --   --  32*  VANCORANDOM 20  --   --   --   --   --   --   --   --   --   --   --   < > = values in this interval not displayed.  Estimated Creatinine Clearance: 61 mL/min (by C-G formula based on SCr of 1.66 mg/dL (H)).    No Known Allergies  Antimicrobials this admission: Zosyn 1/4>>1/6 Vanc 1/4>>1/5; 1/16>> CTX x 1 1/4; 1/6>>1/10; 1/17>>  Dose adjustments this admission: 1/18 VR = 20 1/23 VR = 32 on 1250 q24, scheduled doses dc'd  Microbiology results: 1/4 BCx: NGTD 1/4 UCxx2: NEG 1/4 Resp PCR: neg  1/4 MRSA PCR: neg 1/15 TA - serratia s to ctx 1/15 Blood - 2/2 CoNS staph epidermidis 1/18 Blood - 2/2 CoNS 1/21 Trach (+)abundant serratia marcescens  Thank you for allowing pharmacy to be a part of this patient's care.  Herby Abraham, Pharm.D. [phone] 12/13/2016 10:18 AM

## 2016-12-25 NOTE — Progress Notes (Signed)
Daily Progress Note   Patient Name: Jeremiah Perkins       Date: 01-13-2017 DOB: 1961/04/15  Age: 56 y.o. MRN#: [id number] Attending Physician: Rush Landmark, MD Primary Care Physician: [name], DO Admit Date: 12/07/2016  Reason for Consultation/Follow-up: Establishing goals of care  Subjective: Jeremiah Perkins is relatively unchanged from yesterday. He remains unresponsive.   Length of Stay: 19  Current Medications: Scheduled Meds:  . aspirin  325 mg Oral Daily  . budesonide  0.5 mg Nebulization BID  . cefTRIAXone (ROCEPHIN)  IV  2 g Intravenous Q24H  . chlorhexidine gluconate (MEDLINE KIT)  15 mL Mouth Rinse BID  . diltiazem  60 mg Per Tube Q8H  . famotidine (PEPCID) IV  10 mg Intravenous Q24H  . feeding supplement (PRO-STAT SUGAR FREE 64)  30 mL Per Tube 5 X Daily  . insulin aspart  0-15 Units Subcutaneous Q4H  . ipratropium  0.5 mg Nebulization Q6H  . levalbuterol  0.63 mg Nebulization Q6H  . levETIRAcetam  500 mg Per Tube BID  . mouth rinse  15 mL Mouth Rinse 10 times per day  . sodium chloride flush  10-40 mL Intracatheter Q12H  . vancomycin  1,250 mg Intravenous Q24H    Continuous Infusions: . feeding supplement (VITAL HIGH PROTEIN) 1,000 mL (01/13/2017 0700)  . fentaNYL infusion INTRAVENOUS Stopped (12/12/16 1900)  . heparin 10,000 units/ 20 mL infusion syringe 1,850 Units/hr (01-13-17 0800)  . morphine Stopped (01/13/17 0430)  . phenylephrine (NEO-SYNEPHRINE) Adult infusion    . dialysis replacement fluid (prismasate) 400 mL/hr at 12/15/16 2214  . dialysis replacement fluid (prismasate) 500 mL/hr at 12/15/16 2344  . dialysate (PRISMASATE) 2,000 mL/hr at January 13, 2017 0637    PRN Meds: sodium chloride, acetaminophen (TYLENOL) oral liquid 160 mg/5 mL, fentaNYL (SUBLIMAZE) injection, heparin, heparin,  levalbuterol, loperamide, LORazepam, metoprolol, morphine, ondansetron **OR** ondansetron (ZOFRAN) IV, sodium chloride, sodium chloride flush  Physical Exam         Obese man on ventilator with sedation. Trach in place with foul smell eminating. Unresponsive to painful stimuli. Tachypnea and tachycardia noted. Generalized non-pitting edema.   Vital Signs: BP (!) 82/50 (BP Location: Right Arm)   Pulse (!) 114   Temp 100 F (37.8 C)   Resp (!) 29   Ht _0  (1.702 m)   Wt 120.5 kg (265 lb 10.5 oz)   SpO2 95%   BMI 41.61 kg/m  SpO2: SpO2: 95 % O2 Device: O2 Device: Ventilator O2 Flow Rate: O2 Flow Rate (L/min): 50 L/min  Intake/output summary:  Intake/Output Summary (Last 24 hours) at 2017-01-13 0846 Last data filed at Jan 13, 2017 0800  Gross per 24 hour  Intake          4811.69 ml  Output             6063 ml  Net         -1251.31 ml   LBM: Last BM Date: 01-13-2017 Baseline Weight: Weight: 120.2 kg (265 lb) Most recent weight: Weight: 120.5 kg (265 lb 10.5 oz)       Palliative Assessment/Data: PPS 10%   Flowsheet Rows   Flowsheet Row Most Recent Value  Intake Tab  Referral Department  Critical care  Unit at Time of Referral  ICU  Palliative Care Primary Diagnosis  Neurology  Date Notified  12/12/16  Palliative Care Type  New Palliative care  Reason for referral  Clarify Goals of Care  Date of Admission  12/15/2016  # of days IP prior to Palliative referral  15  Clinical Assessment  Psychosocial & Spiritual Assessment  Palliative Care Outcomes      Patient Active Problem List   Diagnosis Date Noted  . Acute renal failure ([place])   . Ventilator dependent ([place])   . Goals of care, counseling/discussion   . SAH (subarachnoid hemorrhage) ([place])   . Acute respiratory failure ([place])   . Subdural hematoma ([place]) 12/10/2016  . Seizure ([place])   . Sepsis ([place]) 10/26/2016  . Pyelonephritis 10/25/2016  . Essential hypertension   . Acute on chronic systolic heart failure, NYHA class 3  ([place]) 12/13/2015  . Asthma, chronic 11/02/2015  . Primary gout 05/31/2015  . Calculus of gallbladder w/o mention of cholecystitis or obstruction 05/30/2015  . Subclinical hypothyroidism 01/03/2015  . Chronic kidney disease (CKD), stage III (moderate) 01/03/2015  . Lower back pain 02/27/2014  . Long term current use of anticoagulant therapy 07/22/2013  . Atrial flutter ([place]) 07/15/2013  . Morbid obesity ([place]) 11/11/2012  . Arthritis   . Major depression   . Diabetic peripheral neuropathy ([place])   . Psychosexual dysfunction with inhibited sexual excitement   . OSA (obstructive sleep apnea)   . Fever 10/27/2011  . Chronic systolic heart failure ([place])   . Gout   . Diabetes mellitus type 2, controlled, with complications ([place]) 58/85/0277    Palliative Care Assessment & Plan   HPI: 56 y.o. male  with past medical history of DM with diabetic peripheral neuropathy, COPD, CHF, OSA (on CPAP),atrial flutter (on xarelto), Depression, anxiety, and polysubstance abuse who presented to the ED c/o right leg weakness, multiple falls, and headache and was admitted on 12/02/2016 with a large subdural hematoma and seizure (witnessed in ED). He became hypotensive and febrile, with antibiotics initiated for possible sepsis. He ultimately required intubation on 1/5 for airway protection, and has remained on ventilator. He then again became hypotensive and febrile, with MRSA bacteremia identified and concern for tracheitis. His kidney function worsened, likely due to hemodynamically mediated ATN, and CRRT initiated on 1/18 for volume management and electrolyte optimization.   Assessment: On 1/22 a multidisciplinary family meeting was held to discuss Jeremiah Perkins health state.  The patient's wife, Jeremiah Perkins, was able to verbalize that her husband would not want to remain in a state of unresponsiveness and dependent on life support, however she needed time to process all of the information and discuss it with other  family members.   Today, I re-assessed Jeremiah Perkins (no significant change) and called Jeremiah Perkins to check-in. She had a chance to talk with one of her sons, and a number of other family members, and has come to the decision that her husband will ultimately be transitioned to comfort care. However, she first wants to allow family to arrive and see him, as well as prepare funeral arrangements and connect with her pastor (she is not sure when all of the family can get here, but should have a better idea in the next 24-48 hours). The plan is to have him cremated soon after his death, and consequently she wants to give family a chance to see him before then. We also talked about code status; she was unable  to make a decision to change his code status to DNR at this point, but plans to discuss it with family today and asked that I call back tomorrow morning.  Recommendations/Plan:  Full code, full scope interventions for now  Plan to let family arrive, then will transition to comfort care (no clear timeline for this, but I will follow-up with her in the next 24-48 hours in hopes of clarifying the timeline)  Jeremiah Perkins to discuss code status with her family today, she asked that I call her tomorrow morning for a decision  Goals of Care and Additional Recommendations:  Limitations on Scope of Treatment: Full Scope Treatment  Code Status:  Full code  Prognosis:   Unable to determine  Discharge Planning:  To Be Determined  Care plan was discussed with pt's wife, Jeremiah Perkins.  Thank you for allowing the Palliative Medicine Team to assist in the care of this patient.   Total time: 35 minutes    Greater than 50%  of this time was spent counseling and coordinating care related to the above assessment and plan.  Charlynn Court, NP Palliative Medicine Team [phone] pager (7a-5p) Team Phone # [phone]

## 2016-12-25 NOTE — Significant Event (Signed)
Called to bedside by [facility] for cardiac arrest. See code note for full details. Pt with SDH, septic shock, MRSE bacteremia, poor neuro status. Noted plans to transition to comfort care within the next 24-48 hrs. By the time I arrived the code had been stopped after the wife was contacted over the phone. She expressed wishes to stop the resuscitation.   Chilton Greathouse MD [place] [facility] Pager [phone] If no answer or after 3pm call: [phone] Jan 03, 2017, 4:22 PM

## 2016-12-25 NOTE — Discharge Summary (Signed)
DEATH NOTE: For a complete accounting of the patient's history and physical exam on presentation please refer to the H&P dictated on 12/06/2016. Patient was a 56 year old male with a past medical history of diabetes mellitus, COPD, congestive heart failure, obstructive sleep apnea, atrial flutter on systemic anticoagulation, and polysubstance abuse. Patient presented to the emergency department on 1/3 with a history of one day of progressive weakness in his right leg and 5 day history of headache with multiple falls over approximately 2 weeks preceding. Patient reported that his leg began to become numb and he suffered a fall in the bathroom but denied any trauma to his head the day of presentation. Abuse the patient also endorsed using cocaine the evening prior to the morning of his admission. While in the emergency department the patient presented alert and oriented although hypotensive and tachycardic. A CT scan of his head revealed a large falcine subdural hematoma measuring up to 19 mm thick. In the emergency department the patient was noted to have a witnessed seizure and patient was immediately administered Keppra and Ativan. Patient had repeated EEGs performed which suggested severe encephalopathy but showed no evidence of focal seizure activity. Patient underwent further MRI imaging of the brain which showed parafalcine and left tentorial subdural hematoma as well as parafalcine subarachnoid hemorrhage. Ultimately the patient was intubated on 1/5 with worsening mental status. Patient failed to wean from the ventilator and diuresis was initiated. The patient ultimately developed acute renal failure and volume overload requiring continuous renal replacement therapy on 1/18. With his inability to wean from the ventilator a tracheostomy was performed by ENT on 1/18. Palliative medicine was consulted on 1/22 for further family discussions regarding goals of care with the family. On 1/22 I helped alleviate a family  discussion with the patient's wife as well as other family members. We discussed his intrahemispheric subdural hematoma as well as his progressively worsening septic shock. Patient was being treated for a methicillin-resistant Staphylococcus epidermidis bacteremia as well as a tracheitis that ultimately grew Serratia marcescens. Despite antibiotic therapy the patient continued to have slowly increasing vasopressor requirement. We also discussed the patient's slowly deteriorating clinical status. Family felt that they needed more time to contact additional family members who were in different states and requested we continue with a full CODE STATUS. I personally updated the patient's wife via phone on 20-Dec-2022 regarding his continued and escalating vasopressor requirement. She requested we continue full CODE STATUS until she could again discuss with other family members and determine what to do in the event of his demise. At approximately 3:56 PM on Dec 20, 2016 the patient became progressively more hypotensive and Levophed was initiated for further vasopressor support.  A CODE BLUE was initiated approximately 4 PM on 20-Dec-2016. The patient's wife was contacted via phone and she requested that resuscitation efforts be stopped. The patient was pronounced dead at 4:23 PM on Dec 20, 2016.  DIAGNOSES AT DEATH: 1. Septic shock 2. Acute hypoxic respiratory failure 3. Methicillin-resistant Staphylococcus epidermidis bacteremia 4. Serratia marcescens tracheitis 5. Acute renal failure 6. New onset seizure 7. Intrahemispheric subdural hematoma 8. Substance abuse 9. History of atrial flutter on systemic anticoagulation 10. History of congestive heart failure 11. History of asthma/COPD 12. History of diabetic peripheral neuropathy 13. History of GERD 14. History of hyperlipidemia 15. History of hypertension 16. History of major depression 17. History of obstructive sleep apnea 18. History of diabetes mellitus type 2

## 2016-12-25 NOTE — Progress Notes (Signed)
PULMONARY / CRITICAL CARE MEDICINE   Name: Jeremiah Perkins MRN: [id number] DOB: July 11, 1961    ADMISSION DATE:  12/05/2016 CONSULTATION DATE:  12/12/2016  REFERRING MD:  Dr. Allena Katz   CHIEF COMPLAINT: Subdural Hematoma  HPI:  56 y.o. male with PMH of DM, COPD, CHF, OSA (on CPAP at HS), atrial flutter (on xarelto), and polysubstance abuse (patient report used cocaine night before admit). Present to ED on 1/3 with new SDH and seizures. Transferred to ICU and ultimately required intubation. Has been slow to improve since that time and remains on ventilator. No further seizures.   SUBJECTIVE: No acute events overnight. Patient remains on vasopressor support.  REVIEW OF SYSTEMS:  Unable to obtain given ventilator requirement and altered mental status.   VITAL SIGNS: BP 94/68 (BP Location: Right Leg)   Pulse 90   Temp 100 F (37.8 C)   Resp (!) 27   Ht 5\' 7"  (1.702 m)   Wt 265 lb 10.5 oz (120.5 kg)   SpO2 92%   BMI 41.61 kg/m   HEMODYNAMICS:    VENTILATOR SETTINGS: Vent Mode: PRVC FiO2 (%):  [30 %-60 %] 60 % Set Rate:  [18 bmp] 18 bmp Vt Set:  [530 mL] 530 mL PEEP:  [5 cmH20] 5 cmH20 Plateau Pressure:  [15 cmH20-20 cmH20] 19 cmH20  INTAKE / OUTPUT: I/O last 3 completed shifts: In: 7139.8 [I.V.:4864.8; NG/GT:1925; IV Piggyback:350] Out: 9120 [Urine:5; WUJWJ:1914; Stool:150]  PHYSICAL EXAMINATION:  General:  No distress. No family at bedside. Currently on CVVHD. Integument:  Warm & dry. No rash on exposed skin. Extremities:  No cyanosis or clubbing.  HEENT:  No scleral injection or icterus. Endotracheal tube in place.  Cardiovascular:  Regular rate. No edema. On neo drip Pulmonary:  Clear with auscultation anteriorly. Symmetric chest wall rise on ventilator.  Abdomen: Soft. Normal bowel sounds. Protuberant. Large Neurological: On propofol for sedation. No withdrawal to pain in extremities. Pupils equal, symmetric, and reactive.  LABS: CBC Latest Ref Rng & Units 12/19/2016  12/15/2016 12/14/2016  WBC 4.0 - 10.5 K/uL 14.0(H) 14.2(H) 6.2  Hemoglobin 13.0 - 17.0 g/dL 11.7(L) 11.8(L) 10.5(L)  Hematocrit 39.0 - 52.0 % 37.3(L) 36.2(L) 32.7(L)  Platelets 150 - 400 K/uL 351 387 264   BMP Latest Ref Rng & Units 12/08/2016 12/15/2016 12/15/2016  Glucose 65 - 99 mg/dL 782(N) 562(Z) 308(M)  BUN 6 - 20 mg/dL 57(Q) 46(N) 62(X)  Creatinine 0.61 - 1.24 mg/dL 5.28(U) 1.32(G) 4.01(U)  BUN/Creat Ratio 9 - 20 - - -  Sodium 135 - 145 mmol/L 137 138 136  Potassium 3.5 - 5.1 mmol/L 4.8 4.3 4.4  Chloride 101 - 111 mmol/L 103 101 101  CO2 22 - 32 mmol/L 24 26 23   Calcium 8.9 - 10.3 mg/dL 7.7(L) 7.5(L) 7.5(L)    STUDIES:  Echo Jan 2017 > LVEF 35-40% with regional wall motion abnormalities,akinesis of the anteroseptal and apical myocardium CT Head 1/4 > large falcine subdural hematoma measuring up to 19 mm thick unchanged, Minimal subarachnoid hemorrhage at sulci along the superior aspect of the interhemispheric fissure. No intracranial mass, intracerebral hemorrhage or evidence of acute infarction CXR 1/4 > Interstitial and alveolar pulmonary edema pattern without definite pleural effusions EEG 1/5 > Diffuse slowing of background, no seizures seen  EEG 1/6 > generalized polymorphic delta slowing, suggesting severe encephalopathy, no seizures  MRI Head 1/7 > Stable parafalcine and left tentorium subdural hematoma as well as parafalcine subarachnoid hemorrhage Echo 1/20 > 35-40%, reduced systolic function  EEG 1/18>>> This  normal EEG is consistent with a moderate to severe generalized non-specific cerebral dysfunction(encephalopathy)  MICROBIOLOGY: Urine Ctx x2 1/4:  Insignificant Growth & Negative Blood Ctx x2 1/4:  Negative MRSA PCR 1/4:  Negative  Respiratory Panel PCR 1/4:  Negative  Tracheal Asp Ctx 1/5: Negative Urine Ctx 1/15:  Yeast Blood Ctx x2 1/15:  Staph epi 2/2 Bottles Tracheal Asp Ctx 1/15:  Serratia marcescens  Blood Ctx x2 1/18:  Staph epi 2/2 Bottles Tracheal  Site Ctx 1/21 >>serritia marcens>>ss  ANTIBIOTICS: Zosyn 1/4 - 1/6 Vanocmycin 1/4 - 1/5; 1/16 >>  Rocephin 1/4 - 1/10; 1/17 >>  SIGNIFICANT EVENTS: 1/04 - Presents to ED with weakness and headache  1/05 - Intubated  1/18 - Trach via Dr. Jenne Pane   LINES/TUBES: ETT 1/4 - 1/18 R PICC 1/13 - 1/16 Trach #8 (Dr. Jenne Pane) >>  Foley 1/4 >> L NGT 1/18 >> L HD CATH 1/18 >> PIV  CBG (last 3)   Recent Labs  12/15/16 2317 11/28/2016 0330 12/01/2016 0754  GLUCAP 203* 223* 183*     ASSESSMENT / PLAN:  56 y.o. male with interhemispheric subdural hematoma and ongoing septic shock. In the setting of his bacteremia and tracheitis I believe his sepsis as multiple possible sources. With his continued altered mental status I am concerned that there is a significant degree of damage from his hemorrhage. Please refer to lengthy family discussion documented 1/22 and 1/23 via phone.  1. Interhemispheric Subdural Hematoma:  Continuing Keppra via tube BID. 2. Septic Shock:  Multifactorial from MRSE Bacteremia, serratia marcens, and tracheitis. Continuing support of MAP with Neo-Synephrine drip that is titrated.  3. MRSE Bacteremia:  Continuing Vancomycin Day #8. Continuing line holiday.  4. Tracheitis:  Continuing empiric Rocephin Day #7 while Serrita marcens + cultures. 5. Acute Hypoxic Respiratory Failure:  Now s/p tracheostomy. Continuing full ventilator support depending on family wishes.  6. Acute Renal Failure:  Continuing on CVVHD. Nephrology following & appreciate assistance. Monitoring electrolytes twice daily.  7. Hypertriglyceridemia:  Likely due to Propofol. Discontinuing Propofol & starting Morphine drip for relief of pain & discomfort.  8. OSA:  S/P Tracheostomy placement.  9. COPD:  Continuing Atrovent & Xopenex nebs q6hr. 10. Atrial Fibrillation/Flutter: Continuing telemetry monitoring. Continuing diltiazem 60 mg via tube every 8 hour. Holding on systemic anticoagulation. 11. Essential  Hypertension:  Currently hypotensive. Holding anti-hypertensive medications.  12. GERD:  Continuing Pepcid IV q24hr.  13. Diabetes Mellitus:  Glucose reasonably controlled. Continuing accu-checks q4hr with sliding scale insulin per moderate algorithm.  14. Diet:  Continuing tube feedings for now. 15. Prophylaxis:  Pepcid IV q24hr & SCDs. 16. Disposition: May transition to full comfort care depending upon family wishes with ongoing private family discussions. 1/23 wife is leaning towards comfort care but wants all family to be involved  CCT 30 min   Brett Canales Aariv Medlock ACNP [name] PCCM Pager [phone] till 3 pm If no answer page [phone] 12/01/2016, 10:12 AM .

## 2016-12-25 NOTE — Progress Notes (Signed)
eLink Physician-Brief Progress Note Patient Name: Jeremiah Perkins DOB: October 02, 1961 MRN: [id number]   Date of Service  12/03/2016  HPI/Events of Note  Persistent hypotension on neosynephrine. Possible withdrawal in next few days.   eICU Interventions  Will start levophed infusion.      Intervention Category Major Interventions: Hypotension - evaluation and management  Shane Crutch 11/29/2016, 3:56 PM

## 2016-12-25 NOTE — Code Documentation (Signed)
  Patient Name: Jeremiah Perkins   MRN: [id number]   Date of Birth/ Sex: [birth year] , male      Admission Date: 12/02/2016  Attending Provider: Louann Sjogren, MD  Primary Diagnosis: Seizure [facility]) [R56.9] Subdural hematoma (HCC) [I62.00] SAH (subarachnoid hemorrhage) (HCC) [I60.9] Hypoxia [R09.02] Fever, unspecified fever cause [R50.9]   Indication: Pt was in his usual state of health until 4:00 PM, when he was noted to be bradycardic, hypotensive, then pulseless. Code blue was subsequently called. At the time of arrival on scene, ACLS protocol was underway.   Technical Description:  - CPR performance duration:  10 minutes  - Was defibrillation or cardioversion used? No   - Was external pacer placed? No  - Was patient intubated pre/post CPR? No   Medications Administered: Y = Yes; Blank = No Amiodarone    Atropine    Calcium    Epinephrine  y  Lidocaine    Magnesium    Norepinephrine    Phenylephrine    Sodium bicarbonate  y  Vasopressin    Other Y  D50  Post CPR evaluation:  - Final Status - Was patient successfully resuscitated ? No   Miscellaneous Information:  - Time of death:  4:10 PM  - Primary team notified?  Yes  - Family Notified? Yes     Althia Forts, MD   12/05/2016, 4:11 PM

## 2016-12-25 DEATH — deceased

## 2018-05-18 IMAGING — CT CT HEAD W/O CM
3 of 4 series · 16 of 47 positions shown, 19 images · non-contrast
Comparison: None.

CLINICAL DATA: Fall

EXAM:
CT HEAD WITHOUT CONTRAST
TECHNIQUE: Contiguous axial images were obtained from the base of the skull
through the vertex without intravenous contrast.

[Series 201: head w/o, idose (1) · axial · non-contrast · 0.49mm/px · z∈[+199,+329]mm · 10 of 32 slices shown, 13 images]
[im 3/32  brain]
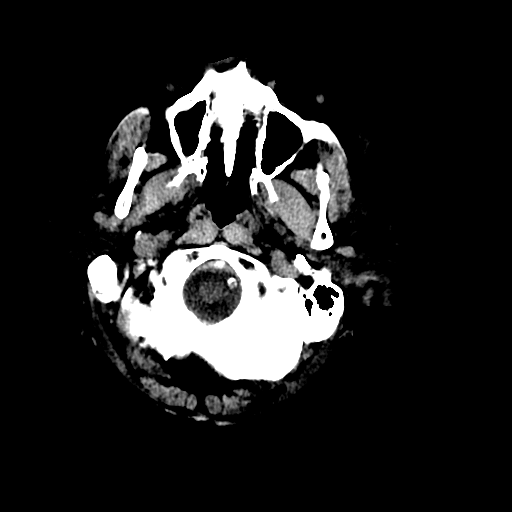
[im 3/32  bone]
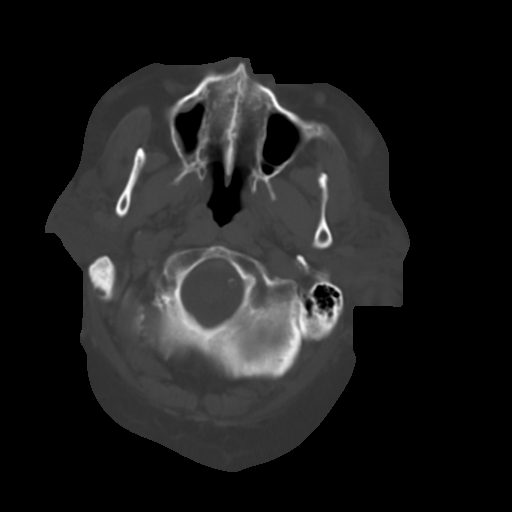
[im 5/32  brain]
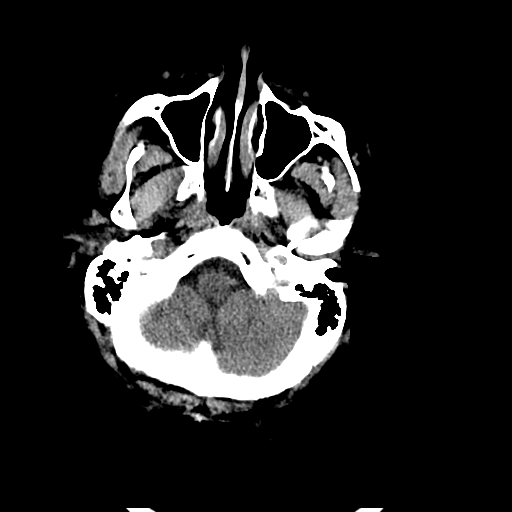
[im 9/32  brain]
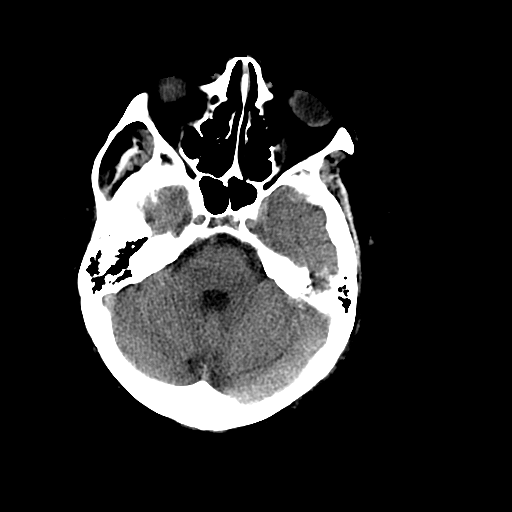
[im 12/32  brain]
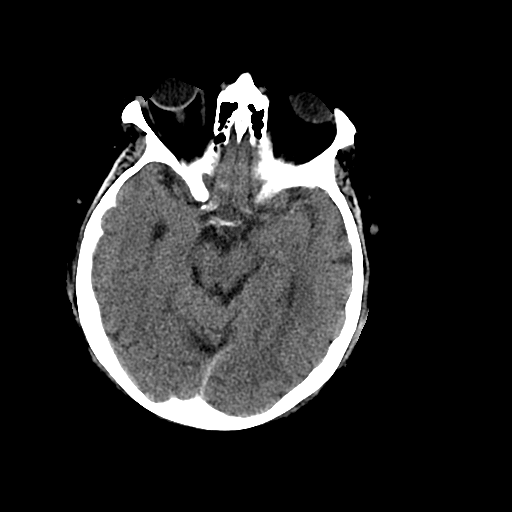
[im 14/32  brain]
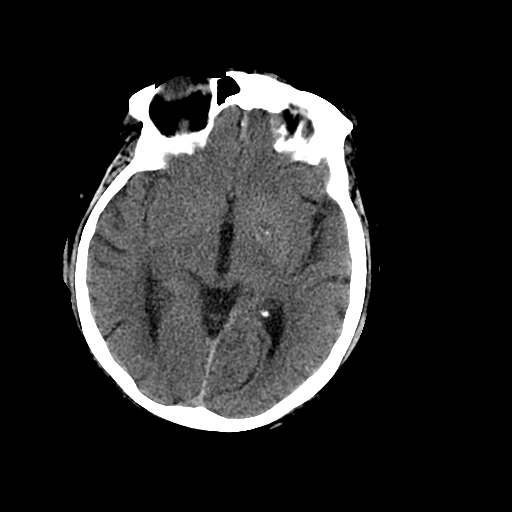
[im 14/32  bone]
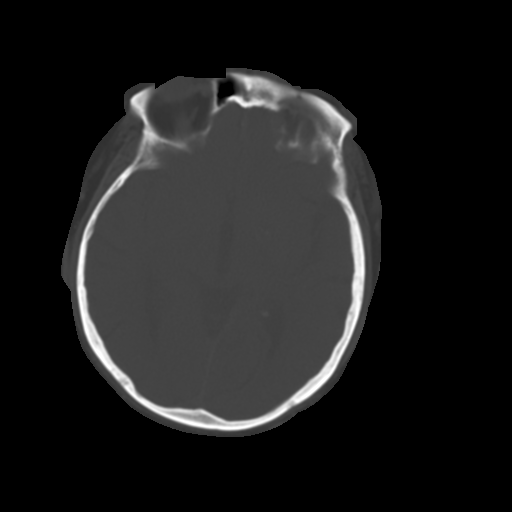
[im 18/32  brain]
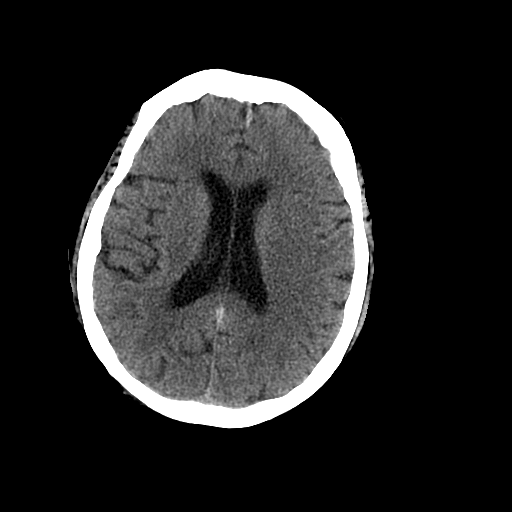
[im 20/32  brain]
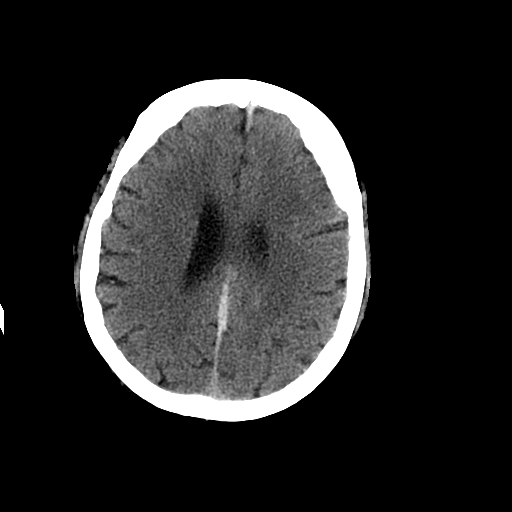
[im 23/32  brain]
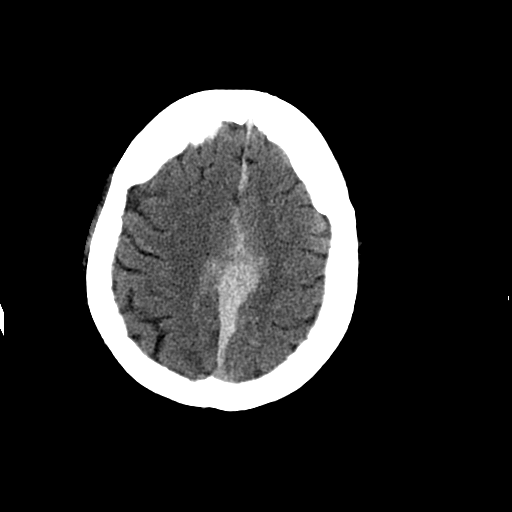
[im 27/32  brain]
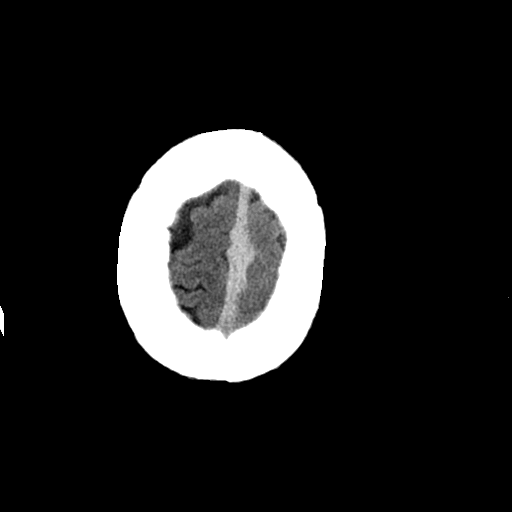
[im 27/32  bone]
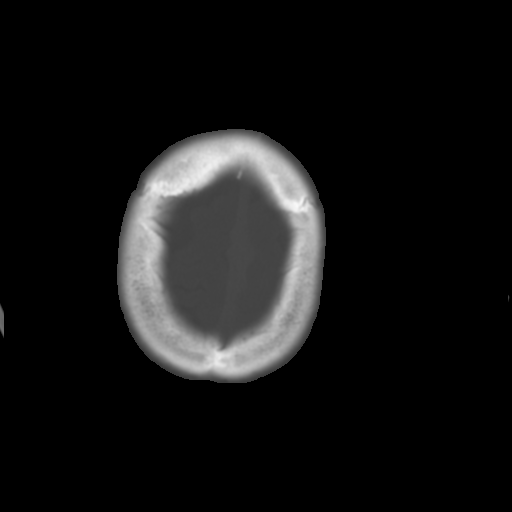
[im 29/32  brain]
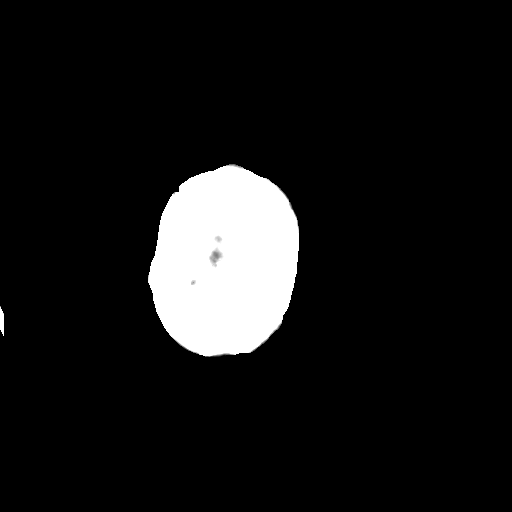

[Series 203: coronal st, idose (1) · coronal · 0.40mm/px · 3 of 81 slices shown]
[im 27/81  brain]
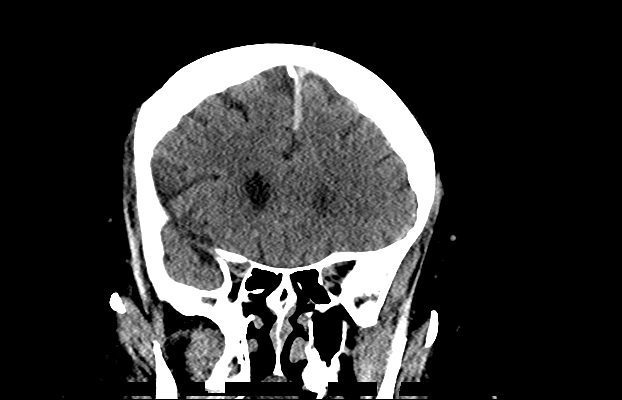
[im 36/81  brain]
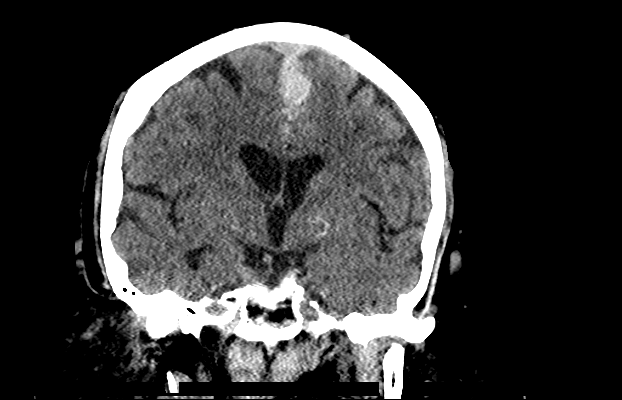
[im 45/81  brain]
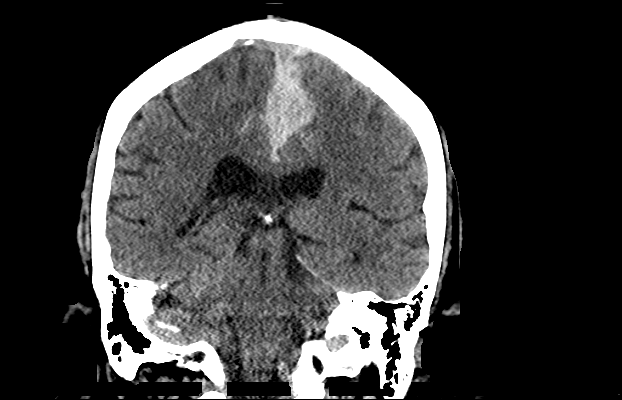

[Series 204: sagittal st, idose (1) · sagittal · 0.40mm/px · 3 of 80 slices shown]
[im 27/80  brain]
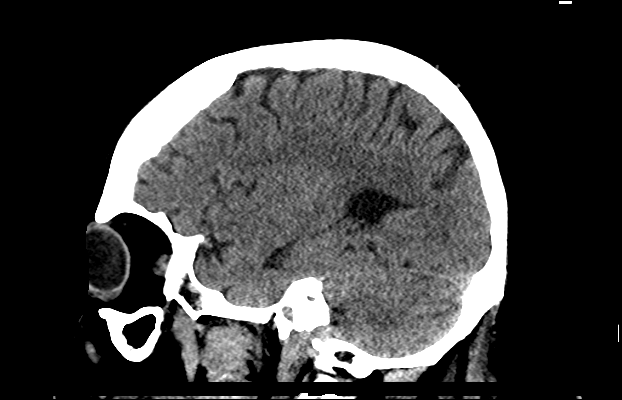
[im 40/80  brain]
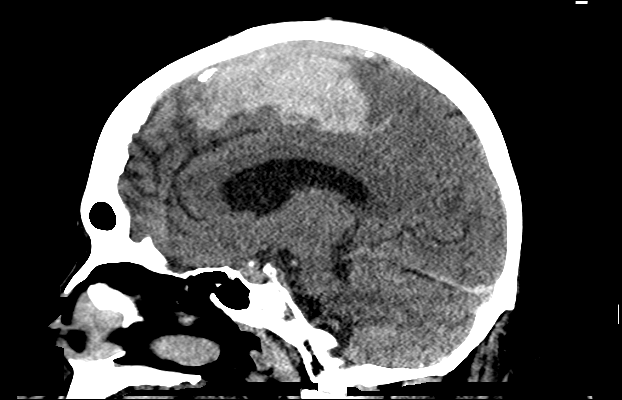
[im 53/80  brain]
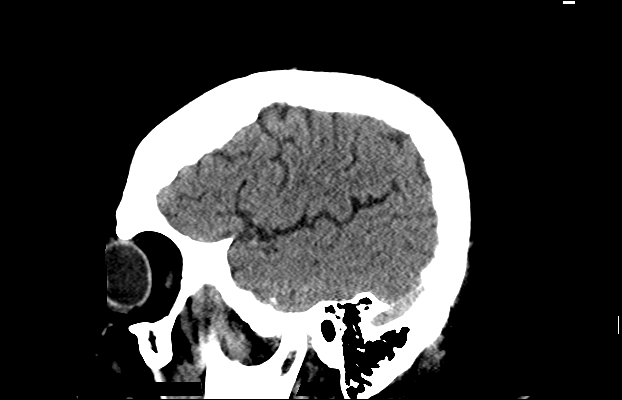

[16 of 47 positions shown; findings below may reference images not displayed]

FINDINGS: Brain: No acute territorial infarction is visualized. Moderate inter
hemispheric subdural hematoma which is seen along the falx, with
left-sided predominance, this measures 19 mm in maximum thickness on
axial images. Mild hypodensity within the brain adjacent to the
hematoma likely reflects mild edema. Subdural hematoma extends along
the anterior interhemispheric fissure. Mild asymmetric density along
the left tentorium would also be consistent with small tentorial
blood. There is no midline shift. Mild increased density paralleling
the subdural hematoma could reflect a small amount of subarachnoid
blood versus small contusion. Ventricles are nonenlarged.

Vascular: No hyperdense vessels. There are carotid artery
calcifications. Vertebral artery calcifications

Skull: The mastoid air cells are clear. There is no skull fracture
visualized.

Sinuses/Orbits: Mucosal thickening within the maxillary and ethmoid
sinuses. No acute orbital abnormality.

Other: None
IMPRESSION: Acute inter hemispheric subdural hematoma along the left aspect of
the falx measuring 1.9 cm in maximum thickness with surrounding mild
edema. No midline shift. Additional small amount of subdural blood
is present along the left tentorium and along the anterior and
posterior interhemispheric fissure. Mild hemorrhagic product
paralleling the subdural hematoma may reflect a small amount of
subarachnoid blood within adjacent sulci versus mild contusion.

Critical Value/emergent results were called by telephone at the time
of interpretation on 11/27/2016 at [DATE] to Dr. JOULINE JOWIE , who
verbally acknowledged these results.
